# Patient Record
Sex: Female | Born: 1955 | Race: White | Hispanic: No | Smoking: Never smoker
Health system: Southern US, Community
[De-identification: ages and names within clinical notes are randomized; demographics above are authoritative.]

## PROBLEM LIST (undated history)

## (undated) DIAGNOSIS — E119 Type 2 diabetes mellitus without complications: Secondary | ICD-10-CM

## (undated) DIAGNOSIS — N183 Chronic kidney disease, stage 3 unspecified: Secondary | ICD-10-CM

## (undated) DIAGNOSIS — I10 Essential (primary) hypertension: Secondary | ICD-10-CM

## (undated) DIAGNOSIS — E669 Obesity, unspecified: Secondary | ICD-10-CM

## (undated) DIAGNOSIS — E785 Hyperlipidemia, unspecified: Secondary | ICD-10-CM

## (undated) HISTORY — DX: Hyperlipidemia, unspecified: E78.5

## (undated) HISTORY — PX: URETHRAL STRICTURE DILATATION: SHX477

## (undated) HISTORY — PX: ACHILLES TENDON REPAIR: SUR1153

## (undated) HISTORY — DX: Obesity, unspecified: E66.9

## (undated) HISTORY — PX: ANKLE SURGERY: SHX546

## (undated) HISTORY — DX: Essential (primary) hypertension: I10

## (undated) HISTORY — DX: Chronic kidney disease, stage 3 unspecified: N18.30

## (undated) HISTORY — DX: Type 2 diabetes mellitus without complications: E11.9

## (undated) HISTORY — DX: Chronic kidney disease, stage 3 (moderate): N18.3

---

## 1998-10-17 ENCOUNTER — Ambulatory Visit (HOSPITAL_COMMUNITY): Admission: RE | Admit: 1998-10-17 | Discharge: 1998-10-17 | Payer: Self-pay | Admitting: *Deleted

## 1998-10-22 ENCOUNTER — Ambulatory Visit (HOSPITAL_COMMUNITY): Admission: RE | Admit: 1998-10-22 | Discharge: 1998-10-22 | Payer: Self-pay | Admitting: *Deleted

## 2001-01-14 ENCOUNTER — Ambulatory Visit (HOSPITAL_COMMUNITY): Admission: RE | Admit: 2001-01-14 | Discharge: 2001-01-14 | Payer: Self-pay | Admitting: Orthopaedic Surgery

## 2001-01-14 ENCOUNTER — Encounter: Payer: Self-pay | Admitting: Orthopaedic Surgery

## 2003-07-05 ENCOUNTER — Ambulatory Visit (HOSPITAL_BASED_OUTPATIENT_CLINIC_OR_DEPARTMENT_OTHER): Admission: RE | Admit: 2003-07-05 | Discharge: 2003-07-05 | Payer: Self-pay | Admitting: Urology

## 2003-07-05 ENCOUNTER — Ambulatory Visit (HOSPITAL_COMMUNITY): Admission: RE | Admit: 2003-07-05 | Discharge: 2003-07-05 | Payer: Self-pay | Admitting: Urology

## 2003-07-05 ENCOUNTER — Encounter (INDEPENDENT_AMBULATORY_CARE_PROVIDER_SITE_OTHER): Payer: Self-pay

## 2004-02-14 ENCOUNTER — Encounter: Admission: RE | Admit: 2004-02-14 | Discharge: 2004-02-14 | Payer: Self-pay | Admitting: Internal Medicine

## 2004-09-16 ENCOUNTER — Encounter: Admission: RE | Admit: 2004-09-16 | Discharge: 2004-09-16 | Payer: Self-pay | Admitting: Internal Medicine

## 2005-11-12 ENCOUNTER — Ambulatory Visit: Payer: Self-pay | Admitting: Internal Medicine

## 2009-06-06 DIAGNOSIS — I10 Essential (primary) hypertension: Secondary | ICD-10-CM | POA: Diagnosis present

## 2009-07-22 ENCOUNTER — Encounter: Admission: RE | Admit: 2009-07-22 | Discharge: 2009-07-22 | Payer: Self-pay | Admitting: Podiatry

## 2010-12-19 NOTE — Op Note (Signed)
NAME:  Theresa Warren, Theresa Warren                        ACCOUNT NO.:  [id number]   MEDICAL RECORD NO.:  [id number]                   PATIENT TYPE:  AMB   LOCATION:  [facility]                                 FACILITY:  [facility]   PHYSICIAN:  Sigmund I. Gaynelle Arabian, M.D.         DATE OF BIRTH:  09/17/1955   DATE OF PROCEDURE:  07/05/2003  DATE OF DISCHARGE:                                 OPERATIVE REPORT   PREOPERATIVE DIAGNOSIS:  Interstitial cystitis.   POSTOPERATIVE DIAGNOSIS:  Interstitial cystitis.   OPERATION:  Cystourethroscopy, hydrodistention of the bladder, cold cup  bladder biopsy, cauterization of the biopsy sites, Clorpactin insertion,  Marcaine and Pyridium insertion, Marcaine and Kenalog injection at the  bladder base.   SURGEON:  Sigmund I. Gaynelle Arabian, M.D.   ANESTHESIA:  General LMA.   PREPARATION:  After appropriate preanesthesia, the patient was brought to  the operating room and placed on the operating table in dorsal supine  position where general LMA anesthesia was introduced. She was then replaced  in the dorsal lithotomy position where the pubis was prepped with Betadine  solution and draped in the usual fashion.   HISTORY:  The patient is a 55 year old female, with a history of recurrent  urinary tract infections and cystitis, on __________ suppression not seen in  several years. She is status post cystoscopy and dilation in 1998 which  definitely helped her, but now she is having more difficulty with urinary  frequency, urgency, suprapubic discomfort. She was diagnosed with diabetes  in 1997, and has done well managing her diabetes. The patient is now for a  cysto and a 2D bladder biopsy.   DESCRIPTION OF PROCEDURE:  The urethra was dilated to a size 2 [place] with  the female urethral dilators without difficulty. Cystoscopy showed a normal  appearing bladder, which holds only 550 mL, hydrodistended to 600 mL.  Multiple areas of microhemorrhages are identified  consistent with  interstitial cystitis. Following this, cold cup bladder biopsies are  obtained and each site is cauterized. These are sent to the laboratory for  evaluation. The bladder was then filled with 250 mL of Clorpactin, and left  for three minutes. The bladder was then drained of fluid, and Marcaine and  Pyridium is inserted in the bladder, and Marcaine and Kenalog is injected  into the bladder base. The patient was awakened and taken to the recovery  room in good condition.                                               Sigmund I. Gaynelle Arabian, M.D.    SIT/MEDQ  D:  07/05/2003  T:  07/05/2003  Job:  [id number]

## 2013-04-25 ENCOUNTER — Ambulatory Visit: Payer: Self-pay | Admitting: Internal Medicine

## 2014-04-24 ENCOUNTER — Ambulatory Visit: Payer: Self-pay | Admitting: Podiatry

## 2014-09-11 ENCOUNTER — Other Ambulatory Visit (HOSPITAL_COMMUNITY): Payer: Self-pay | Admitting: Internal Medicine

## 2014-09-11 ENCOUNTER — Encounter: Payer: Self-pay | Admitting: Internal Medicine

## 2014-09-11 ENCOUNTER — Encounter: Payer: Self-pay | Admitting: Cardiology

## 2014-09-11 ENCOUNTER — Ambulatory Visit (INDEPENDENT_AMBULATORY_CARE_PROVIDER_SITE_OTHER): Payer: 59 | Admitting: Cardiology

## 2014-09-11 VITALS — BP 158/82 | HR 130 | Ht 61.5 in | Wt 281.3 lb

## 2014-09-11 DIAGNOSIS — R0602 Shortness of breath: Secondary | ICD-10-CM

## 2014-09-11 DIAGNOSIS — R0600 Dyspnea, unspecified: Secondary | ICD-10-CM | POA: Insufficient documentation

## 2014-09-11 DIAGNOSIS — R002 Palpitations: Secondary | ICD-10-CM | POA: Insufficient documentation

## 2014-09-11 DIAGNOSIS — R7989 Other specified abnormal findings of blood chemistry: Secondary | ICD-10-CM

## 2014-09-11 NOTE — Patient Instructions (Signed)
Your physician recommends that you schedule a follow-up appointment in: as needed with Dr. Percival Spanish  We are ordering a stress test

## 2014-09-11 NOTE — Progress Notes (Signed)
Cardiology Office Note   Date:  09/11/2014   ID:  [name], DOB 02-Jun-1956, MRN [id number]  PCP:  No primary care provider on file.  Cardiologist:   Minus Breeding, MD   Chief Complaint  Patient presents with  . Palpitations  . Shortness of Breath      History of Present Illness: Theresa Warren is a 59 y.o. female who presents for evaluation of chest pain and SOB.   She has no prior cardiac history though she does have some risk factors. She's had some increasing lower extremity swelling that was treated with Lasix. She's mostly been bothered by some chronic fatigue. She also has shortness of breath. The shortness of breath has been the biggest issue and has been progressive. It started with activities but has now progressed to being shortness of breath at rest.  She stacks her pillows. It has been slowly progressive over one year. She's not describing classic PND or orthopnea.  She does have chest discomfort. This is a soreness that is there almost constantly but she does also get some other sharper discomfort. This is not reproducible with activities. It happens at rest and goes away if it does go away spontaneously. The most difficult thing she does physically is roll her trash cans to the curb and this will cause shortness of breath.  She did have a workup by Dr. Joylene Draft.  BNP was 18 she had a slightly elevated d-dimer and lower extremity Dopplers. She's not had prior cardiac workup however. Of note he does have renal insufficiency that has been stable.    Past Medical History  Diagnosis Date  . CKD (chronic kidney disease), stage III   . Hypertension   . Obesity   . Hyperlipidemia   . Diabetes     Past Surgical History  Procedure Laterality Date  . Urethral stricture dilatation    . Ankle surgery    . Achilles tendon repair       Current Outpatient Prescriptions  Medication Sig Dispense Refill  . ALPRAZolam (XANAX) 0.5 MG tablet Take 0.5 mg by mouth at bedtime as  needed for anxiety.    Marland Kitchen atorvastatin (LIPITOR) 40 MG tablet Take 40 mg by mouth daily.    . Calcium Citrate-Vitamin D (CALCIUM CITRATE + D PO) Take 1 tablet by mouth daily.    . fexofenadine (ALLEGRA) 180 MG tablet Take 180 mg by mouth daily.    Marland Kitchen HUMULIN R 500 UNIT/ML SOLN injection Inject 19 Units into the skin 2 (two) times daily.  3  . Omega-3 Krill Oil 500 MG CAPS Take 1 capsule by mouth daily.    Marland Kitchen OVER THE COUNTER MEDICATION Take 1 tablet by mouth daily. Multi-Betic    . traMADol (ULTRAM) 50 MG tablet Take 50 mg by mouth every 6 (six) hours as needed.    . valsartan-hydrochlorothiazide (DIOVAN-HCT) 320-25 MG per tablet Take 1 tablet by mouth daily.  3  . Vitamin D, Ergocalciferol, (DRISDOL) 50000 UNITS CAPS capsule Take 1 capsule by mouth 2 (two) times a week.  4  . zolpidem (AMBIEN) 5 MG tablet Take 5 mg by mouth at bedtime as needed for sleep.     No current facility-administered medications for this visit.    Allergies:   Sulfa antibiotics and Penicillins    Social History:  The patient  reports that she has never smoked. She does not have any smokeless tobacco history on file.   Family History:  The patient's family history includes ALS  in her mother; Alzheimer's disease in her father; CAD in an other family member; CAD (age of onset: 22) in her father; Diabetes in her father; Lung cancer in her sister.    ROS:  Please see the history of present illness.   Otherwise, review of systems are positive for reflux, fatigue chronically, back pain..   All other systems are reviewed and negative.    PHYSICAL EXAM: VS:  BP 158/82 mmHg  Pulse 130  Ht 5' 1.5" (1.562 m)  Wt 281 lb 4.8 oz (127.597 kg)  BMI 52.30 kg/m2 , BMI Body mass index is 52.3 kg/(m^2). GEN: Well nourished, well developed, in no acute distress HEENT: normal Neck: no JVD, carotid bruits, or masses Cardiac: RRR; no murmurs, rubs, or gallops,trace edema  Respiratory:  clear to auscultation bilaterally, normal work  of breathing GI: soft, nontender, nondistended, + BS MS: no deformity or atrophy Skin: warm and dry, no rash Neuro:  Strength and sensation are intact Psych: euthymic mood, full affect, tearful   EKG:  EKG is ordered today. The ekg ordered today demonstrates sinus tachycardia, rate 130, axis within normal limits, intervals within normal limits, no acute ST-T wave changes.   Recent Labs: No results found for requested labs within last 365 days.    Lipid Panel No results found for: CHOL, TRIG, HDL, CHOLHDL, VLDL, LDLCALC, LDLDIRECT    Wt Readings from Last 3 Encounters:  09/11/14 281 lb 4.8 oz (127.597 kg)      Other studies Reviewed: Additional studies/ records that were reviewed today include: Outside records and labs. Review of the above records demonstrates: See elsewhere   ASSESSMENT AND PLAN:  CHEST PAIN:  I don't have a strong suspicion that this is cardiac. However, he does have risk factors. She needs stress testing but wouldn't be a walk on a treadmill. She will have a [employer].  DYSPNEA:  I don't think that this is related to heart failure. Her BNP was normal. I don't think an echo would be contributory. There is no evidence of ischemia I might suggest further pulmonary workup. Certainly weight and deconditioning contribute.  TEARFULNESS:  Dr. Joylene Draft has wanted to treat her for depression. I think she probably has some element of this and we talked about this briefly.   Current medicines are reviewed at length with the patient today.  The patient does not have concerns regarding medicines.  The following changes have been made:  no change  Labs/ tests ordered today include: Lexiscan Myoview.  Orders Placed This Encounter  Procedures  . Myocardial Perfusion Imaging     Disposition:   FU with me as needed   Signed, Minus Breeding, MD  09/11/2014 3:22 PM    [facility]

## 2014-09-12 ENCOUNTER — Encounter: Payer: Self-pay | Admitting: Cardiology

## 2014-09-12 ENCOUNTER — Ambulatory Visit (HOSPITAL_COMMUNITY): Admission: RE | Admit: 2014-09-12 | Payer: 59 | Source: Ambulatory Visit

## 2014-09-12 ENCOUNTER — Ambulatory Visit (HOSPITAL_COMMUNITY): Payer: 59

## 2014-09-12 ENCOUNTER — Other Ambulatory Visit (HOSPITAL_COMMUNITY): Payer: Self-pay | Admitting: Cardiology

## 2014-09-12 DIAGNOSIS — R0600 Dyspnea, unspecified: Secondary | ICD-10-CM

## 2014-09-12 DIAGNOSIS — R079 Chest pain, unspecified: Secondary | ICD-10-CM

## 2014-09-14 ENCOUNTER — Ambulatory Visit (HOSPITAL_COMMUNITY)
Admission: RE | Admit: 2014-09-14 | Discharge: 2014-09-14 | Disposition: A | Discharge status: Home / Self Care | Payer: 59 | Source: Ambulatory Visit | Attending: Internal Medicine | Admitting: Internal Medicine

## 2014-09-14 DIAGNOSIS — R791 Abnormal coagulation profile: Secondary | ICD-10-CM | POA: Insufficient documentation

## 2014-09-14 DIAGNOSIS — R0602 Shortness of breath: Secondary | ICD-10-CM

## 2014-09-14 DIAGNOSIS — R0789 Other chest pain: Secondary | ICD-10-CM | POA: Diagnosis not present

## 2014-09-14 DIAGNOSIS — R7989 Other specified abnormal findings of blood chemistry: Secondary | ICD-10-CM

## 2014-09-14 MED ORDER — TECHNETIUM TO 99M ALBUMIN AGGREGATED
6.0000 | Freq: Once | INTRAVENOUS | Status: AC | PRN
  Administered 2014-09-14: 6 via INTRAVENOUS

## 2014-09-14 MED ORDER — TECHNETIUM TC 99M DIETHYLENETRIAME-PENTAACETIC ACID: 40.0000 | Freq: Once | INTRAVENOUS | Status: AC | PRN

## 2014-09-14 NOTE — Addendum Note (Signed)
Addended byChauncy Lean. on: 09/14/2014 01:43 PM   Modules accepted: Orders

## 2014-09-25 ENCOUNTER — Telehealth (HOSPITAL_COMMUNITY): Payer: Self-pay

## 2014-09-25 NOTE — Telephone Encounter (Signed)
Encounter complete. 

## 2014-09-26 ENCOUNTER — Telehealth (HOSPITAL_COMMUNITY): Payer: Self-pay

## 2014-09-26 NOTE — Telephone Encounter (Signed)
Encounter complete. 

## 2014-09-27 ENCOUNTER — Ambulatory Visit (HOSPITAL_COMMUNITY)
Admission: RE | Admit: 2014-09-27 | Discharge: 2014-09-27 | Disposition: A | Discharge status: Home / Self Care | Payer: 59 | Source: Ambulatory Visit | Attending: Cardiovascular Disease | Admitting: Cardiovascular Disease

## 2014-09-27 DIAGNOSIS — R002 Palpitations: Secondary | ICD-10-CM | POA: Insufficient documentation

## 2014-09-27 DIAGNOSIS — R0600 Dyspnea, unspecified: Secondary | ICD-10-CM | POA: Insufficient documentation

## 2014-09-27 DIAGNOSIS — R0602 Shortness of breath: Secondary | ICD-10-CM

## 2014-09-27 DIAGNOSIS — R0789 Other chest pain: Secondary | ICD-10-CM

## 2014-09-27 MED ORDER — REGADENOSON 0.4 MG/5ML IV SOLN
0.4000 mg | Freq: Once | INTRAVENOUS | Status: AC
  Administered 2014-09-27: 0.4 mg via INTRAVENOUS

## 2014-09-27 MED ORDER — TECHNETIUM TC 99M SESTAMIBI GENERIC - CARDIOLITE
32.0000 | Freq: Once | INTRAVENOUS | Status: AC | PRN
  Administered 2014-09-27: 32 via INTRAVENOUS

## 2014-09-27 MED ORDER — AMINOPHYLLINE 25 MG/ML IV SOLN
125.0000 mg | Freq: Once | INTRAVENOUS | Status: AC
  Administered 2014-09-27: 125 mg via INTRAVENOUS

## 2014-09-27 NOTE — Procedures (Addendum)
[facility] [address] [place] [address] [id number]  Cardiology Nuclear Med Study  Theresa Warren is a 59 y.o. female     MRN : [id number]     DOB: 07-02-1956  Procedure Date: 09/27/2014  Nuclear Med Background Indication for Stress Test:  Evaluation for Ischemia History:  No prior cardiac or respiratory history reported;No prior NUC MPI for comparison. Cardiac Risk Factors: Hypertension, IDDM Type 2, Lipids, Obesity and CKD III;LEE  Symptoms:  Chest Pain, DOE, Fatigue, Light-Headedness, Nausea, Palpitations and SOB   Nuclear Pre-Procedure Caffeine/Decaff Intake:  12:30am NPO After: 9:30pm   IV Site: R Forearm  IV 0.9% NS with Angio Cath:  22g  Chest Size (in):  n/a IV Started by: Rolene Course, RN  Height: 5\' 1"  (1.549 m)  Cup Size: C  BMI:  Body mass index is 53.12 kg/(m^2). Weight:  281 lb (127.461 kg)   Tech Comments:  n/a    Nuclear Med Study 1 or 2 day study: 2 day  Stress Test Type:  [place] Provider:  Minus Breeding, MD   Resting Radionuclide: Technetium 90m Sestamibi  Resting Radionuclide Dose: 31.1 mCi   Stress Radionuclide:  Technetium 42m Sestamibi  Stress Radionuclide Dose: 32.0 mCi           Stress Protocol Rest HR: 136 Stress HR: 122  Rest BP: 166/87 Stress BP: 173/64  Exercise Time (min): n/a METS: n/a          Dose of Adenosine (mg):  n/a Dose of Lexiscan: 0.4 mg  Dose of Atropine (mg): n/a Dose of Dobutamine: n/a mcg/kg/min (at max HR)  Stress Test Technologist: Leane Para, CCT Nuclear Technologist: Imagene Riches, CNMT   Rest Procedure:  Myocardial perfusion imaging was performed at rest 45 minutes following the intravenous administration of Technetium 66m Sestamibi. Stress Procedure:  The patient received IV Lexiscan 0.4 mg over 15-seconds.  Technetium 73m Sestamibi injected IV at 30-seconds.  Patient experienced nausea and headache and 125 mg Aminophylline IV  was administered.There were no significant changes with Lexiscan.  Quantitative spect images were obtained after a 45 minute delay.  Transient Ischemic Dilatation (Normal <1.22):  0.93  QGS EDV:  49 ml QGS ESV:  14 ml LV Ejection Fraction: 72%    Rest ECG: Sinus tachycardia, low voltage, no ST changes.  Stress ECG: No significant ST segment change suggestive of ischemia.  QPS Raw Data Images:  Acquisition technically good; normal left ventricular size. Stress Images:  Normal homogeneous uptake in all areas of the myocardium. Rest Images:  Normal homogeneous uptake in all areas of the myocardium. Subtraction (SDS):  No evidence of ischemia.  Impression Exercise Capacity:  Lexiscan with no exercise. BP Response:  Normal blood pressure response. Clinical Symptoms:  No chest pain or dyspnea. ECG Impression:  No significant ST segment change suggestive of ischemia. Comparison with Prior Nuclear Study: No previous nuclear study performed  Overall Impression:  Normal stress nuclear study.  LV Wall Motion:  NL LV Function; NL Wall Motion   Kirk Ruths, MD  09/28/2014 5:18 PM

## 2014-09-28 ENCOUNTER — Ambulatory Visit (HOSPITAL_COMMUNITY)
Admission: RE | Admit: 2014-09-28 | Discharge: 2014-09-28 | Disposition: A | Discharge status: Home / Self Care | Payer: 59 | Source: Ambulatory Visit | Attending: Cardiology | Admitting: Cardiology

## 2014-09-28 DIAGNOSIS — E669 Obesity, unspecified: Secondary | ICD-10-CM | POA: Insufficient documentation

## 2014-09-28 DIAGNOSIS — R002 Palpitations: Secondary | ICD-10-CM | POA: Insufficient documentation

## 2014-09-28 DIAGNOSIS — R079 Chest pain, unspecified: Secondary | ICD-10-CM | POA: Insufficient documentation

## 2014-09-28 DIAGNOSIS — R110 Nausea: Secondary | ICD-10-CM | POA: Diagnosis not present

## 2014-09-28 DIAGNOSIS — N183 Chronic kidney disease, stage 3 (moderate): Secondary | ICD-10-CM | POA: Insufficient documentation

## 2014-09-28 DIAGNOSIS — R0602 Shortness of breath: Secondary | ICD-10-CM

## 2014-09-28 DIAGNOSIS — E119 Type 2 diabetes mellitus without complications: Secondary | ICD-10-CM | POA: Insufficient documentation

## 2014-09-28 DIAGNOSIS — I129 Hypertensive chronic kidney disease with stage 1 through stage 4 chronic kidney disease, or unspecified chronic kidney disease: Secondary | ICD-10-CM | POA: Insufficient documentation

## 2014-09-28 DIAGNOSIS — R5383 Other fatigue: Secondary | ICD-10-CM | POA: Diagnosis not present

## 2014-09-28 DIAGNOSIS — R42 Dizziness and giddiness: Secondary | ICD-10-CM | POA: Diagnosis not present

## 2014-09-28 DIAGNOSIS — R0600 Dyspnea, unspecified: Secondary | ICD-10-CM | POA: Insufficient documentation

## 2014-09-28 DIAGNOSIS — R0789 Other chest pain: Secondary | ICD-10-CM

## 2014-09-28 MED ORDER — TECHNETIUM TC 99M SESTAMIBI GENERIC - CARDIOLITE
31.1000 | Freq: Once | INTRAVENOUS | Status: AC | PRN
  Administered 2014-09-28: 31.1 via INTRAVENOUS

## 2014-10-01 ENCOUNTER — Telehealth (HOSPITAL_COMMUNITY): Payer: Self-pay | Admitting: *Deleted

## 2014-10-10 ENCOUNTER — Ambulatory Visit (INDEPENDENT_AMBULATORY_CARE_PROVIDER_SITE_OTHER): Payer: 59 | Admitting: Internal Medicine

## 2014-10-10 ENCOUNTER — Encounter: Payer: Self-pay | Admitting: Internal Medicine

## 2014-10-10 VITALS — BP 110/76 | HR 139 | Temp 98.0°F | Ht 61.5 in | Wt 293.6 lb

## 2014-10-10 DIAGNOSIS — R0600 Dyspnea, unspecified: Secondary | ICD-10-CM

## 2014-10-10 DIAGNOSIS — G479 Sleep disorder, unspecified: Secondary | ICD-10-CM | POA: Insufficient documentation

## 2014-10-10 NOTE — Progress Notes (Signed)
Date: 10/10/2014  MRN# [id number] Theresa Warren 1956-01-17  Referring Physician: Dr. Willette Pa is a 59 y.o. old female seen in consultation for chronic shortness of breath  CC "short of breath"  Chief Complaint  Patient presents with  . Advice Only    Pt referred for sob and palpitations.    HPI:  Patient is a pleasant 59 year old female with a past medical history of diabetes, hypertension, obesity, osteoarthritis, stage III kidney disease, being evaluated for dyspnea. Patient states that for the past 2-3 months she has chronic dyspnea occurring at rest and with exertion. Patient states that she can walk about 20-30 yards before she gets severely dyspneic, walking from the parking lot to the clinic induced dyspnea. She has had extensive workup with negative VQ scan, negative nuclear cardiac stress test, normal chest x-ray, normal lower extremity Dopplers. Patient stated she had a bronchitis back in January 2015 that lasted for a couple weeks and experiencing shortness of breath at that time. In December of this past year patient states she may have had a upper respiratory tract infection and noted dyspnea lasting since then also. Currently she endorses hoarse voice, nasal congestion, she is currently on Allegra which is not helping her nasal congestion or runny nose. She also endorses chronic fatigue and chronic lower extremity swelling. She was seen by her memory care physician within the last month placed on Asmanex with very little relief in her shortness of breath, she stated Asmanex gave her headaches. Patient also endorses a intermittent nonproductive cough at times. She has had spoke to about sleep apnea testing in the past but was unable to perform a do to scheduling difficulties. Given her severe obesity advised on having a gastric bypass evaluation, but patient declined this. Patient states she is a nonsmoker, does not have a secondhand smoke exposure, she is currently  employed as a Quarry manager at [employer], patient currently has no pets used to have a cat a couple of years ago.  Obstructive Sleep Apnea Screening The patient was screened with the STOP-BANG questionnaire. >3 positive responses is considered a positive screen  Snoring  YES Tiredness  YES Observed Apnea NO Pressure (HTN) YES BMI >35  YES Age > 50  YES Neck >17"  YES Gender (female) NO  Total: 6/8  Screen: POSITIVE      PMHX:   Past Medical History  Diagnosis Date  . CKD (chronic kidney disease), stage III   . Hypertension   . Obesity   . Hyperlipidemia   . Diabetes    Surgical Hx:  Past Surgical History  Procedure Laterality Date  . Urethral stricture dilatation    . Ankle surgery    . Achilles tendon repair     Family Hx:  Family History  Problem Relation Age of Onset  . Alzheimer's disease Father   . Diabetes Father   . CAD Father 62  . CAD      Multliple maternal aunts and uncles with early onset heart disease  . Lung cancer Sister   . ALS Mother    Social Hx:   History  Substance Use Topics  . Smoking status: Never Smoker   . Smokeless tobacco: Not on file  . Alcohol Use: Not on file   Medication:   Current Outpatient Rx  Name  Route  Sig  Dispense  Refill  . ALPRAZolam (XANAX) 0.5 MG tablet   Oral   Take 0.5 mg by mouth at bedtime as  needed for anxiety.         Marland Kitchen atorvastatin (LIPITOR) 40 MG tablet   Oral   Take 40 mg by mouth daily.         . Calcium Citrate-Vitamin D (CALCIUM CITRATE + D PO)   Oral   Take 1 tablet by mouth daily.         . fexofenadine (ALLEGRA) 180 MG tablet   Oral   Take 180 mg by mouth daily.         . furosemide (LASIX) 20 MG tablet   Oral   Take 20 mg by mouth daily.         Marland Kitchen HUMULIN R 500 UNIT/ML SOLN injection   Subcutaneous   Inject 19 Units into the skin 2 (two) times daily.      3     Dispense as written.   . Omega-3 Krill Oil 500 MG CAPS   Oral   Take 1 capsule by mouth daily.          Marland Kitchen OVER THE COUNTER MEDICATION   Oral   Take 1 tablet by mouth daily. Multi-Betic         . traMADol (ULTRAM) 50 MG tablet   Oral   Take 50 mg by mouth every 6 (six) hours as needed.         . valsartan-hydrochlorothiazide (DIOVAN-HCT) 320-25 MG per tablet   Oral   Take 1 tablet by mouth daily.      3   . Vitamin D, Ergocalciferol, (DRISDOL) 50000 UNITS CAPS capsule   Oral   Take 1 capsule by mouth 2 (two) times a week.      4   . zolpidem (AMBIEN) 5 MG tablet   Oral   Take 5 mg by mouth at bedtime as needed for sleep.             Allergies:  Sulfa antibiotics and Penicillins  Review of Systems: Gen:  Denies  fever, sweats, chills HEENT: nasal congestion, runny nose, mild intermittent headaches Cvc:  Chest tightness and heaviness at times Resp:   Shortness of breath at rest and with exertion, intermittent dry cough Gi: Denies swallowing difficulty, stomach pain, nausea or vomiting, diarrhea, constipation, bowel incontinence Gu:  Denies bladder incontinence, burning urine Ext:   No Joint pain, admits to leg swelling in the lower extremities Skin: No skin rash, easy bruising or bleeding or hives Endoc:  No polyuria, polydipsia , polyphagia or weight change Psych: No depression, insomnia or hallucinations  Other:  All other systems negative  Physical Examination:   VS: BP 110/76 mmHg  Pulse 139  Temp(Src) 98 F (36.7 C) (Oral)  Ht 5' 1.5" (1.562 m)  Wt 293 lb 9.6 oz (133.176 kg)  BMI 54.58 kg/m2  SpO2 97%  General Appearance: No distress  Neuro:without focal findings, mental status, speech normal, alert and oriented, cranial nerves 2-12 intact, reflexes normal and symmetric, sensation grossly normal  HEENT: PERRLA, EOM intact, no ptosis, no other lesions noticed; Mallampati 3 Pulmonary: normal breath sounds in the upper airways, mild shallow respiratory effort, decreased breath sounds at the bilateral bases. No wheezes, crackles, or rhonchi. Sputum  production: None CardiovascularNormal S1,S2.  No m/r/g.  Abdominal aorta pulsation normal.    Abdomen: Benign, Soft, non-tender, No masses, hepatosplenomegaly, No lymphadenopathy Renal:  No costovertebral tenderness  GU:  No performed at this time. Endoc: No evident thyromegaly, no signs of acromegaly or Cushing features Skin:   warm, no rashes, no  ecchymosis  Extremities: normal, no cyanosis, clubbing, no edema, warm with normal capillary refill. Other findings:   Labs results:   Rad results:  09/28/14 Impression Exercise Capacity: Lexiscan with no exercise. BP Response: Normal blood pressure response. Clinical Symptoms: No chest pain or dyspnea. ECG Impression: No significant ST segment change suggestive of  ischemia. Comparison with Prior Nuclear Study: No previous nuclear study  performed  Overall Impression: Normal stress nuclear study.  09/14/2014 Chest x-ray EXAM: CHEST 2 VIEW  COMPARISON: None.  FINDINGS: The heart size and mediastinal contours are within normal limits. Both lungs are clear. No pneumothorax or plural effusion is noted. The visualized skeletal structures are unremarkable.  IMPRESSION: No acute cardiopulmonary abnormality seen.   09/14/2014 VQ scan EXAM: NUCLEAR MEDICINE VENTILATION - PERFUSION LUNG SCAN  TECHNIQUE: Ventilation images were obtained in multiple projections using inhaled aerosol technetium 99 M DTPA. Perfusion images were obtained in multiple projections after intravenous injection of Tc-46m MAA.  RADIOPHARMACEUTICALS: Six mCi Tc-42m DTPA aerosol and 40 mCi Tc-54m MAA  COMPARISON: None.  FINDINGS: Ventilation: No focal ventilation defect.  Perfusion: No wedge shaped peripheral perfusion defects to suggest acute pulmonary embolism.  IMPRESSION: No evidence of acute pulmonary embolism.  09/11/2014 Bilateral lower extremity Dopplers Negative DVT bilateral lower extremities    Assessment and  Plan:59 year old female past medical history of stage IIIc daily, hyperlipidemia, hypertension, diabetes, obesity being evaluated for chronic shortness of breath Sleep disturbance Patient respect of sleep apnea. Sleep screening test was severely positive. Stop bang score 6/8  Plan: -Split-night study -Weight loss, healthy diet, exercise.   Severe obesity (BMI >= 40) OBESITY  Wt: 293 lbs BMI:54 Discussed importance of weight reduction.  Educated regarding limitation of  intake of greasy/fried foods.  Instructed on benefit of  a low-impact exercise program, starting slowly.  Discussed benefits of 30-45 minutes of some form of exercise daily as well as benefit of supervised exercise program.  Introduced the idea of gastric bypass to the patient currently she states that this is not an option that she would like to pursue at this time.     Dyspnea Differential diagnosis includes: Obesity, sleep apnea, obstructive disease, restrictive disease, parenchymal lung disease,  At this time believe the patient's chronic shortness of breath is multifactorial in nature related to obesity and possibly underlying obstructive sleep apnea with deconditioning. Also given the severity of her dyspnea with her current negative cardiac workup, will further evaluate with CT chest without contrast.  Plan: -Pulmonary function testing and 6 minute walk test -CT chest without contrast (patient with history of stage III kidney disease) -Split-night sleep study -Weight loss, healthy diet, exercise     Updated Medication List Outpatient Encounter Prescriptions as of 10/10/2014  Medication Sig  . ALPRAZolam (XANAX) 0.5 MG tablet Take 0.5 mg by mouth at bedtime as needed for anxiety.  Marland Kitchen atorvastatin (LIPITOR) 40 MG tablet Take 40 mg by mouth daily.  . Calcium Citrate-Vitamin D (CALCIUM CITRATE + D PO) Take 1 tablet by mouth daily.  . fexofenadine (ALLEGRA) 180 MG tablet Take 180 mg by mouth daily.  .  furosemide (LASIX) 20 MG tablet Take 20 mg by mouth daily.  Marland Kitchen HUMULIN R 500 UNIT/ML SOLN injection Inject 19 Units into the skin 2 (two) times daily.  . Omega-3 Krill Oil 500 MG CAPS Take 1 capsule by mouth daily.  Marland Kitchen OVER THE COUNTER MEDICATION Take 1 tablet by mouth daily. Multi-Betic  . traMADol (ULTRAM) 50 MG tablet Take 50 mg by mouth  every 6 (six) hours as needed.  . valsartan-hydrochlorothiazide (DIOVAN-HCT) 320-25 MG per tablet Take 1 tablet by mouth daily.  . Vitamin D, Ergocalciferol, (DRISDOL) 50000 UNITS CAPS capsule Take 1 capsule by mouth 2 (two) times a week.  . zolpidem (AMBIEN) 5 MG tablet Take 5 mg by mouth at bedtime as needed for sleep.    Orders for this visit: Orders Placed This Encounter  Procedures  . CT Chest Wo Contrast    Standing Status: Future     Number of Occurrences:      Standing Expiration Date: 12/10/2015    Order Specific Question:  Reason for Exam (SYMPTOM  OR DIAGNOSIS REQUIRED)    Answer:  sob    Order Specific Question:  Is the patient pregnant?    Answer:  No    Order Specific Question:  Preferred imaging location?    Answer:  [facility]  . Pulmonary function test    Standing Status: Future     Number of Occurrences:      Standing Expiration Date: 10/10/2015    Order Specific Question:  Where should this test be performed?    Answer:  [facility]    Order Specific Question:  Full PFT: includes the following: basic spirometry, spirometry pre & post bronchodilator, diffusion capacity (DLCO), lung volumes    Answer:  Full PFT    Order Specific Question:  MIP/MEP    Answer:  No    Order Specific Question:  6 minute walk    Answer:  Yes    Order Specific Question:  ABG    Answer:  No    Order Specific Question:  Diffusion capacity (DLCO)    Answer:  No    Order Specific Question:  Lung volumes    Answer:  No    Order Specific Question:  Methacholine challenge    Answer:  No  . Split night study    Standing Status: Future      Number of Occurrences:      Standing Expiration Date: 10/10/2015    Order Specific Question:  Where should this test be performed:    Answer:  [facility]     Thank  you for the consultation and for allowing [facility] to assist in the care of your patient. Our recommendations are noted above.  Please contact us if we can be of further service.   Vilinda Boehringer, MD [facility] Office Number: [phone]

## 2014-10-10 NOTE — Assessment & Plan Note (Signed)
OBESITY  Wt: 293 lbs BMI:54 Discussed importance of weight reduction.  Educated regarding limitation of  intake of greasy/fried foods.  Instructed on benefit of  a low-impact exercise program, starting slowly.  Discussed benefits of 30-45 minutes of some form of exercise daily as well as benefit of supervised exercise program.  Introduced the idea of gastric bypass to the patient currently she states that this is not an option that she would like to pursue at this time.

## 2014-10-10 NOTE — Assessment & Plan Note (Signed)
Patient respect of sleep apnea. Sleep screening test was severely positive. Stop bang score 6/8  Plan: -Split-night study -Weight loss, healthy diet, exercise.

## 2014-10-10 NOTE — Patient Instructions (Addendum)
Please follow up with Dr. Stevenson Clinch - please obtain a split night study, our office will make a referral - CT chest without contrast - diet, weight loss, and exercise - pulmonary functioning testing and 6 minute walk test.

## 2014-10-10 NOTE — Assessment & Plan Note (Signed)
Differential diagnosis includes: Obesity, sleep apnea, obstructive disease, restrictive disease, parenchymal lung disease,  At this time believe the patient's chronic shortness of breath is multifactorial in nature related to obesity and possibly underlying obstructive sleep apnea with deconditioning. Also given the severity of her dyspnea with her current negative cardiac workup, will further evaluate with CT chest without contrast.  Plan: -Pulmonary function testing and 6 minute walk test -CT chest without contrast (patient with history of stage III kidney disease) -Split-night sleep study -Weight loss, healthy diet, exercise

## 2014-10-11 ENCOUNTER — Institutional Professional Consult (permissible substitution): Payer: 59 | Admitting: Internal Medicine

## 2014-10-18 ENCOUNTER — Ambulatory Visit: Payer: Self-pay | Admitting: Internal Medicine

## 2014-11-15 ENCOUNTER — Ambulatory Visit: Admit: 2014-11-15 | Disposition: A | Discharge status: Home / Self Care | Payer: Self-pay | Admitting: Internal Medicine

## 2014-11-15 ENCOUNTER — Encounter: Payer: Self-pay | Admitting: Internal Medicine

## 2014-11-26 ENCOUNTER — Ambulatory Visit (INDEPENDENT_AMBULATORY_CARE_PROVIDER_SITE_OTHER): Payer: 59 | Admitting: Internal Medicine

## 2014-11-26 ENCOUNTER — Ambulatory Visit: Payer: 59

## 2014-11-26 DIAGNOSIS — R0600 Dyspnea, unspecified: Secondary | ICD-10-CM

## 2014-11-26 DIAGNOSIS — R002 Palpitations: Secondary | ICD-10-CM

## 2014-11-26 LAB — PULMONARY FUNCTION TEST
DL/VA % pred: 89 %
DL/VA: 4.07 ml/min/mmHg/L
DLCO unc % pred: 100 %
DLCO unc: 21.64 ml/min/mmHg
FEF 25-75 Post: 2.24 L/sec
FEF 25-75 Pre: 2.29 L/sec
FEF2575-%Change-Post: -2 %
FEF2575-%Pred-Post: 94 %
FEF2575-%Pred-Pre: 96 %
FEV1-%Change-Post: 0 %
FEV1-%Pred-Post: 89 %
FEV1-%Pred-Pre: 88 %
FEV1-Post: 2.18 L
FEV1-Pre: 2.16 L
FEV1FVC-%Change-Post: 1 %
FEV1FVC-%Pred-Pre: 102 %
FEV6-%Change-Post: 0 %
FEV6-%Pred-Post: 87 %
FEV6-%Pred-Pre: 87 %
FEV6-Post: 2.64 L
FEV6-Pre: 2.64 L
FEV6FVC-%Change-Post: 0 %
FEV6FVC-%Pred-Post: 103 %
FEV6FVC-%Pred-Pre: 104 %
FVC-%Change-Post: 0 %
FVC-%Pred-Post: 84 %
FVC-%Pred-Pre: 85 %
FVC-Post: 2.65 L
FVC-Pre: 2.67 L
Post FEV1/FVC ratio: 82 %
Post FEV6/FVC ratio: 100 %
Pre FEV1/FVC ratio: 81 %
Pre FEV6/FVC Ratio: 100 %
RV % pred: 89 %
RV: 1.64 L
TLC % pred: 93 %
TLC: 4.43 L

## 2014-11-26 NOTE — Progress Notes (Signed)
PFT performed today. 

## 2014-11-26 NOTE — Progress Notes (Signed)
SMW performed today. 

## 2014-11-27 ENCOUNTER — Ambulatory Visit (INDEPENDENT_AMBULATORY_CARE_PROVIDER_SITE_OTHER): Payer: 59 | Admitting: Internal Medicine

## 2014-11-27 DIAGNOSIS — R0600 Dyspnea, unspecified: Secondary | ICD-10-CM | POA: Diagnosis not present

## 2014-11-27 DIAGNOSIS — G4733 Obstructive sleep apnea (adult) (pediatric): Secondary | ICD-10-CM

## 2014-11-27 MED ORDER — FLUTICASONE PROPIONATE 50 MCG/ACT NA SUSP: 2.0000 | Freq: Every day | NASAL | Status: AC

## 2014-11-27 NOTE — Assessment & Plan Note (Addendum)
OBESITY  Wt: 297 lbs (4 pound increase since last visit is). Discussed importance of weight reduction.  Educated regarding limitation of  intake of greasy/fried foods.  Instructed on benefit of  a low-impact exercise program, starting slowly.  Discussed benefits of 30-45 minutes of some form of exercise daily as well as benefit of supervised exercise program.  Introduced the idea of gastric bypass to the patient currently she states that this is not an option that she would like to pursue at this time.

## 2014-11-27 NOTE — Progress Notes (Signed)
MRN# [id number] Theresa Warren 01-18-1956   CC:"shortness breath" Chief Complaint  Patient presents with  . Follow-up    Pt here f/u SMW/PFT, CT, split night      Brief History: HPI 10/10/14 Patient is a pleasant 59 year old female with a past medical history of diabetes, hypertension, obesity, osteoarthritis, stage III kidney disease, being evaluated for dyspnea. Patient states that for the past 2-3 months she has chronic dyspnea occurring at rest and with exertion. Patient states that she can walk about 20-30 yards before she gets severely dyspneic, walking from the parking lot to the clinic induced dyspnea. She has had extensive workup with negative VQ scan, negative nuclear cardiac stress test, normal chest x-ray, normal lower extremity Dopplers. Patient stated she had a bronchitis back in January 2015 that lasted for a couple weeks and experiencing shortness of breath at that time. In December of this past year patient states she may have had a upper respiratory tract infection and noted dyspnea lasting since then also. Currently she endorses hoarse voice, nasal congestion, she is currently on Allegra which is not helping her nasal congestion or runny nose. She also endorses chronic fatigue and chronic lower extremity swelling. She was seen by her primary care physician within the last month placed on Asmanex with very little relief in her shortness of breath, she stated Asmanex gave her headaches. Patient also endorses a intermittent nonproductive cough at times. She has had spoke to about sleep apnea testing in the past but was unable to perform a do to scheduling difficulties. Given her severe obesity advised on having a gastric bypass evaluation, but patient declined this. Patient states she is a nonsmoker, does not have a secondhand smoke exposure, she is currently employed as a Quarry manager at [employer], patient currently has no pets used to have a cat a couple of years ago. STOP  BANG = Positive Plan - split night study, CT chest w\o contrast, wt loss, PFTs, 6MWT   Events since last clinic visit: Patient presents today for followup visit of her continued shortness of breath. Overall she states that her shortness of breath is in stable since her last visit, there have been no emergency room/urgent care visits since she last saw pulmonary. Still with nasal congestion in the morning. Had sleep study done. No fever, no cough, does admit to chronic nasal congestion. Had pfts, 72mwt, and CT chest done.    PMHX:   Past Medical History  Diagnosis Date  . CKD (chronic kidney disease), stage III   . Hypertension   . Obesity   . Hyperlipidemia   . Diabetes    Surgical Hx:  Past Surgical History  Procedure Laterality Date  . Urethral stricture dilatation    . Ankle surgery    . Achilles tendon repair     Family Hx:  Family History  Problem Relation Age of Onset  . Alzheimer's disease Father   . Diabetes Father   . CAD Father 55  . CAD      Multliple maternal aunts and uncles with early onset heart disease  . Lung cancer Sister   . ALS Mother    Social Hx:   History  Substance Use Topics  . Smoking status: Never Smoker   . Smokeless tobacco: Not on file  . Alcohol Use: Not on file   Medication:   Current Outpatient Rx  Name  Route  Sig  Dispense  Refill  . ALPRAZolam (XANAX) 0.5 MG tablet  Oral   Take 0.5 mg by mouth at bedtime as needed for anxiety.         Marland Kitchen atorvastatin (LIPITOR) 40 MG tablet   Oral   Take 40 mg by mouth daily.         . Calcium Citrate-Vitamin D (CALCIUM CITRATE + D PO)   Oral   Take 1 tablet by mouth daily.         . fexofenadine (ALLEGRA) 180 MG tablet   Oral   Take 180 mg by mouth daily.         . furosemide (LASIX) 20 MG tablet   Oral   Take 20 mg by mouth daily.         Marland Kitchen HUMULIN R 500 UNIT/ML SOLN injection   Subcutaneous   Inject 19 Units into the skin 2 (two) times daily.      3     Dispense  as written.   . Omega-3 Krill Oil 500 MG CAPS   Oral   Take 1 capsule by mouth daily.         Marland Kitchen OVER THE COUNTER MEDICATION   Oral   Take 1 tablet by mouth daily. Multi-Betic         . traMADol (ULTRAM) 50 MG tablet   Oral   Take 50 mg by mouth every 6 (six) hours as needed.         . valsartan-hydrochlorothiazide (DIOVAN-HCT) 320-25 MG per tablet   Oral   Take 1 tablet by mouth daily.      3   . Vitamin D, Ergocalciferol, (DRISDOL) 50000 UNITS CAPS capsule   Oral   Take 1 capsule by mouth 2 (two) times a week.      4   . zolpidem (AMBIEN) 5 MG tablet   Oral   Take 5 mg by mouth at bedtime as needed for sleep.            Review of Systems: Gen:  Denies  fever, sweats, chills HEENT: mild sore throat, nasal congestion with clear drainage Cvc:  No dizziness, chest pain or heaviness Resp:   Chronic shortness of breath Gi: Denies swallowing difficulty, stomach pain, nausea or vomiting, diarrhea, constipation, bowel incontinence Gu:  Denies bladder incontinence, burning urine Ext:   No Joint pain. Admit to leg swelling Skin: No skin rash, easy bruising or bleeding or hives Endoc:  No polyuria, polydipsia , polyphagia or weight change Psych: No depression, insomnia or hallucinations  Other:  All other systems negative  Allergies:  Sulfa antibiotics and Penicillins  Physical Examination:  VS: BP 128/80 mmHg  Pulse 127  Temp(Src) 97.4 F (36.3 C) (Oral)  Ht 5\' 2"  (1.575 m)  Wt 297 lb (134.718 kg)  BMI 54.31 kg/m2  SpO2 94%  General Appearance: No distress  Neuro: EXAM: without focal findings, mental status, speech normal, alert and oriented, cranial nerves 2-12 grossly normal  HEENT: PERRLA, EOM intact, no ptosis, no other lesions noticed Pulmonary:Exam: normal breath sounds., diaphragmatic excursion normal.No wheezing, No rales   Cardiovascular:@ Exam:  Normal S1,S2.  No m/r/g.     Abdomen:Exam: Benign, Soft, non-tender, No masses  Skin:   warm, no  rashes, no ecchymosis  Extremities: normal, no cyanosis, clubbing, warm with normal capillary refill.   Mild pitting edema to the mid legs.  Labs results:  BMP No results found for: NA, K, CL, CO2, GLUCOSE, BUN, CREATININE   CBC No flowsheet data found.   Rad results: (The following images  and results were reviewed by Dr. Stevenson Clinch). CT Chest 10/18/2014 CT CHEST WITHOUT CONTRAST  TECHNIQUE: Multidetector CT imaging of the chest was performed following the standard protocol without IV contrast.. COMPARISON:  None.  FINDINGS: Lungs are adequately inflated and demonstrate linear subpleural density over the posterior right lower lobe likely atelectasis and less likely an acute or chronic infectious or inflammatory process. There is a subtle focus of peripheral interstitial prominence over the inferior lateral right middle lobe which also may be due to an acute or chronic infectious or inflammatory process. Left lung is clear. No evidence of effusion. Airways are normal.  Heart is normal in size. There is no evidence of hilar or mediastinal adenopathy. Remaining mediastinal structures are normal.  Images through the upper abdomen demonstrate mild diffuse hepatic steatosis. Mild fatty atrophy of the pancreas. Couple sub cm exophytic densities over the upper pole of the left kidney too small to characterize but likely cysts. Remainder the exam is unremarkable.   IMPRESSION: Predominant linear subpleural density over the posterior right lower lobe likely atelectasis. Patchy peripheral interstitial density over the inferior lateral right middle lobe likely chronic although, although may be seen with an acute atypical infectious or inflammatory process.  Hepatic steatosis.  Two tiny sub cm exophytic densities over the upper pole of the left kidney too small to characterize but likely cysts.  6 minute walk test-276 feet/84.12 m, no desaturations below 90%, note: Has pins in her  ankles  Pulmonary function testing 11/26/2014 FVC 85% FEV1 88% FEV1/FVC 81% TLC 93 RV 89 DLCO 100 Impression: Essentially normal pulmonary function testing, no significant obstruction/restriction noted. Severely reduced ERV, most likely related to weight.   Assessment and Plan:59 year old female past medical history of stage IIIc daily, presenting for evaluation of persistent dyspnea, negative nuclear stress test, negative VQ scan, negative chest x-ray. OSA (obstructive sleep apnea)  1. OSA  Discussed sleep data and reviewed with patient.  Encouraged proper weight management.  Excessive weight may contribute to snoring.  Monitor sedative use.  Discussed driving precautions and its relationship with hypersomnolence.  Discussed operating dangerous equipment and its relationship with hypersomnolence.  Discussed sleep hygiene, and benefits of a fixed sleep waked time.  The importance of getting eight or more hours of sleep discussed with patient.  Discussed limiting the use of the computer and television before bedtime.  Decrease naps during the day, so night time sleep will become enhanced.  Limit caffeine, and sleep deprivation.  HTN, stroke, and heart failure are potential risk factors.  C-PAP Compliance:      Plan: Start non-invasive positive pressure, autopap (5-15cm H2O).        Dyspnea Multifactorial:Obesity, sleep apnea, deconditioning  At this time believe the patient's chronic shortness of breath is multifactorial in nature related to obesity and obstructive sleep apnea with deconditioning. Her 6 minute walk test is limited due to having pains in her ankles, however she was only able to walk 276 feet without any desaturations. A pulmonary function testing was essentially normal, there was no significant restriction or obstruction noted to A CT scan of the chest showed only some mild atelectasis versus inflammatory response in the inferior right lateral middle lobe.  This could be chronic, will optimize her OSA and deconditioning first, if infectious symptoms such as fever persistent cough, persistent shortness of breath continues, will then consider a bronchoscopy to look for atypical infection.  Plan: -  autopap (5-15cmH2O) for your mild sleep apnea (AHI=8.3) - Flonase (40mcg) - 2 sprays  to each nostril daily until symptom control, then decrease to 1 spray to each nostril daily and then stop when symptoms have gone.  - Weight loss, healthy diet, exercise     Severe obesity (BMI >= 40) OBESITY  Wt: 297 lbs (4 pound increase since last visit is). Discussed importance of weight reduction.  Educated regarding limitation of  intake of greasy/fried foods.  Instructed on benefit of  a low-impact exercise program, starting slowly.  Discussed benefits of 30-45 minutes of some form of exercise daily as well as benefit of supervised exercise program.  Introduced the idea of gastric bypass to the patient currently she states that this is not an option that she would like to pursue at this time.         Updated Medication List Outpatient Encounter Prescriptions as of 11/27/2014  Medication Sig  . ALPRAZolam (XANAX) 0.5 MG tablet Take 0.5 mg by mouth at bedtime as needed for anxiety.  Marland Kitchen atorvastatin (LIPITOR) 40 MG tablet Take 40 mg by mouth daily.  . Calcium Citrate-Vitamin D (CALCIUM CITRATE + D PO) Take 1 tablet by mouth daily.  . fexofenadine (ALLEGRA) 180 MG tablet Take 180 mg by mouth daily.  . furosemide (LASIX) 20 MG tablet Take 20 mg by mouth daily.  Marland Kitchen HUMULIN R 500 UNIT/ML SOLN injection Inject 19 Units into the skin 2 (two) times daily.  . Omega-3 Krill Oil 500 MG CAPS Take 1 capsule by mouth daily.  Marland Kitchen OVER THE COUNTER MEDICATION Take 1 tablet by mouth daily. Multi-Betic  . traMADol (ULTRAM) 50 MG tablet Take 50 mg by mouth every 6 (six) hours as needed.  . valsartan-hydrochlorothiazide (DIOVAN-HCT) 320-25 MG per tablet Take 1 tablet by mouth  daily.  . Vitamin D, Ergocalciferol, (DRISDOL) 50000 UNITS CAPS capsule Take 1 capsule by mouth 2 (two) times a week.  . zolpidem (AMBIEN) 5 MG tablet Take 5 mg by mouth at bedtime as needed for sleep.    Orders for this visit: Orders Placed This Encounter  Procedures  . Ambulatory Referral for DME    Referral Priority:  Routine    Referral Type:  Durable Medical Equipment Purchase    Number of Visits Requested:  1    Thank  you for the visitation and for allowing  [facility] to assist in the care of your patient. Our recommendations are noted above.  Please contact us if we can be of further service.  Vilinda Boehringer, MD [facility] Office Number: [phone]

## 2014-11-27 NOTE — Assessment & Plan Note (Signed)
  1. OSA  Discussed sleep data and reviewed with patient.  Encouraged proper weight management.  Excessive weight may contribute to snoring.  Monitor sedative use.  Discussed driving precautions and its relationship with hypersomnolence.  Discussed operating dangerous equipment and its relationship with hypersomnolence.  Discussed sleep hygiene, and benefits of a fixed sleep waked time.  The importance of getting eight or more hours of sleep discussed with patient.  Discussed limiting the use of the computer and television before bedtime.  Decrease naps during the day, so night time sleep will become enhanced.  Limit caffeine, and sleep deprivation.  HTN, stroke, and heart failure are potential risk factors.  C-PAP Compliance:      Plan: Start non-invasive positive pressure, autopap (5-15cm H2O).

## 2014-11-27 NOTE — Assessment & Plan Note (Addendum)
Multifactorial:Obesity, sleep apnea, deconditioning  At this time believe the patient's chronic shortness of breath is multifactorial in nature related to obesity and obstructive sleep apnea with deconditioning. Her 6 minute walk test is limited due to having pains in her ankles, however she was only able to walk 276 feet without any desaturations. A pulmonary function testing was essentially normal, there was no significant restriction or obstruction noted to A CT scan of the chest showed only some mild atelectasis versus inflammatory response in the inferior right lateral middle lobe. This could be chronic, will optimize her OSA and deconditioning first, if infectious symptoms such as fever persistent cough, persistent shortness of breath continues, will then consider a bronchoscopy to look for atypical infection.  Plan: -  autopap (5-15cmH2O) for your mild sleep apnea (AHI=8.3) - Flonase (40mcg) - 2 sprays to each nostril daily until symptom control, then decrease to 1 spray to each nostril daily and then stop when symptoms have gone.  - Weight loss, healthy diet, exercise

## 2014-11-27 NOTE — Patient Instructions (Signed)
Follow up in 2 months with Dr. Stevenson Clinch - we will start on on autopap (5-15cmH2O) for your mild sleep apnea (AHI=8.3) - Flonase (39mcg) - 2 sprays to each nostril daily until symptom control, then decrease to 1 spray to each nostril daily and then stop when symptoms have gone.  - healthy diet, weight loss and exercise

## 2018-08-30 DIAGNOSIS — N184 Chronic kidney disease, stage 4 (severe): Secondary | ICD-10-CM | POA: Diagnosis not present

## 2018-08-30 DIAGNOSIS — E1129 Type 2 diabetes mellitus with other diabetic kidney complication: Secondary | ICD-10-CM | POA: Diagnosis not present

## 2018-08-30 DIAGNOSIS — Z6841 Body Mass Index (BMI) 40.0 and over, adult: Secondary | ICD-10-CM | POA: Diagnosis not present

## 2018-08-30 DIAGNOSIS — I10 Essential (primary) hypertension: Secondary | ICD-10-CM | POA: Diagnosis not present

## 2018-08-30 DIAGNOSIS — Z794 Long term (current) use of insulin: Secondary | ICD-10-CM | POA: Insufficient documentation

## 2018-11-10 DIAGNOSIS — M810 Age-related osteoporosis without current pathological fracture: Secondary | ICD-10-CM | POA: Diagnosis not present

## 2018-11-10 DIAGNOSIS — R82998 Other abnormal findings in urine: Secondary | ICD-10-CM | POA: Diagnosis not present

## 2018-11-10 DIAGNOSIS — M109 Gout, unspecified: Secondary | ICD-10-CM | POA: Diagnosis not present

## 2018-11-10 DIAGNOSIS — E1129 Type 2 diabetes mellitus with other diabetic kidney complication: Secondary | ICD-10-CM | POA: Diagnosis not present

## 2018-11-10 DIAGNOSIS — E7849 Other hyperlipidemia: Secondary | ICD-10-CM | POA: Diagnosis not present

## 2018-11-10 DIAGNOSIS — D509 Iron deficiency anemia, unspecified: Secondary | ICD-10-CM | POA: Diagnosis not present

## 2018-11-17 DIAGNOSIS — R609 Edema, unspecified: Secondary | ICD-10-CM | POA: Diagnosis not present

## 2018-11-17 DIAGNOSIS — G4733 Obstructive sleep apnea (adult) (pediatric): Secondary | ICD-10-CM | POA: Diagnosis not present

## 2018-11-17 DIAGNOSIS — Z1331 Encounter for screening for depression: Secondary | ICD-10-CM | POA: Diagnosis not present

## 2018-11-17 DIAGNOSIS — N184 Chronic kidney disease, stage 4 (severe): Secondary | ICD-10-CM | POA: Diagnosis not present

## 2018-11-17 DIAGNOSIS — Z794 Long term (current) use of insulin: Secondary | ICD-10-CM | POA: Diagnosis not present

## 2018-11-17 DIAGNOSIS — M542 Cervicalgia: Secondary | ICD-10-CM | POA: Diagnosis not present

## 2018-11-17 DIAGNOSIS — R0602 Shortness of breath: Secondary | ICD-10-CM | POA: Diagnosis not present

## 2018-11-17 DIAGNOSIS — Z0000 Encounter for general adult medical examination without abnormal findings: Secondary | ICD-10-CM | POA: Diagnosis not present

## 2018-11-17 DIAGNOSIS — E1129 Type 2 diabetes mellitus with other diabetic kidney complication: Secondary | ICD-10-CM | POA: Diagnosis not present

## 2018-11-17 DIAGNOSIS — N2581 Secondary hyperparathyroidism of renal origin: Secondary | ICD-10-CM | POA: Diagnosis not present

## 2018-11-17 DIAGNOSIS — M109 Gout, unspecified: Secondary | ICD-10-CM | POA: Diagnosis not present

## 2018-12-12 DIAGNOSIS — D631 Anemia in chronic kidney disease: Secondary | ICD-10-CM | POA: Diagnosis not present

## 2018-12-12 DIAGNOSIS — N184 Chronic kidney disease, stage 4 (severe): Secondary | ICD-10-CM | POA: Diagnosis not present

## 2018-12-12 DIAGNOSIS — N2581 Secondary hyperparathyroidism of renal origin: Secondary | ICD-10-CM | POA: Diagnosis not present

## 2018-12-12 DIAGNOSIS — M797 Fibromyalgia: Secondary | ICD-10-CM | POA: Diagnosis not present

## 2018-12-12 DIAGNOSIS — M109 Gout, unspecified: Secondary | ICD-10-CM | POA: Diagnosis not present

## 2018-12-12 DIAGNOSIS — E1121 Type 2 diabetes mellitus with diabetic nephropathy: Secondary | ICD-10-CM | POA: Diagnosis not present

## 2018-12-12 DIAGNOSIS — N179 Acute kidney failure, unspecified: Secondary | ICD-10-CM | POA: Diagnosis not present

## 2018-12-12 DIAGNOSIS — I129 Hypertensive chronic kidney disease with stage 1 through stage 4 chronic kidney disease, or unspecified chronic kidney disease: Secondary | ICD-10-CM | POA: Diagnosis not present

## 2019-02-09 DIAGNOSIS — F329 Major depressive disorder, single episode, unspecified: Secondary | ICD-10-CM | POA: Diagnosis not present

## 2019-02-09 DIAGNOSIS — E1129 Type 2 diabetes mellitus with other diabetic kidney complication: Secondary | ICD-10-CM | POA: Diagnosis not present

## 2019-02-09 DIAGNOSIS — R000 Tachycardia, unspecified: Secondary | ICD-10-CM | POA: Diagnosis not present

## 2019-02-09 DIAGNOSIS — R0602 Shortness of breath: Secondary | ICD-10-CM | POA: Diagnosis not present

## 2019-02-09 DIAGNOSIS — N184 Chronic kidney disease, stage 4 (severe): Secondary | ICD-10-CM | POA: Diagnosis not present

## 2019-02-09 DIAGNOSIS — M109 Gout, unspecified: Secondary | ICD-10-CM | POA: Diagnosis not present

## 2019-02-09 DIAGNOSIS — F419 Anxiety disorder, unspecified: Secondary | ICD-10-CM | POA: Diagnosis not present

## 2019-02-09 DIAGNOSIS — G4733 Obstructive sleep apnea (adult) (pediatric): Secondary | ICD-10-CM | POA: Diagnosis not present

## 2019-02-09 DIAGNOSIS — Z794 Long term (current) use of insulin: Secondary | ICD-10-CM | POA: Diagnosis not present

## 2019-02-09 DIAGNOSIS — D509 Iron deficiency anemia, unspecified: Secondary | ICD-10-CM | POA: Diagnosis not present

## 2019-02-09 DIAGNOSIS — R609 Edema, unspecified: Secondary | ICD-10-CM | POA: Diagnosis not present

## 2019-02-24 DIAGNOSIS — E1129 Type 2 diabetes mellitus with other diabetic kidney complication: Secondary | ICD-10-CM | POA: Diagnosis not present

## 2019-02-24 DIAGNOSIS — N184 Chronic kidney disease, stage 4 (severe): Secondary | ICD-10-CM | POA: Diagnosis not present

## 2019-02-24 DIAGNOSIS — R946 Abnormal results of thyroid function studies: Secondary | ICD-10-CM | POA: Diagnosis not present

## 2019-02-24 DIAGNOSIS — D649 Anemia, unspecified: Secondary | ICD-10-CM | POA: Diagnosis not present

## 2019-02-24 DIAGNOSIS — D509 Iron deficiency anemia, unspecified: Secondary | ICD-10-CM | POA: Diagnosis not present

## 2019-03-14 DIAGNOSIS — D631 Anemia in chronic kidney disease: Secondary | ICD-10-CM | POA: Diagnosis not present

## 2019-03-14 DIAGNOSIS — M797 Fibromyalgia: Secondary | ICD-10-CM | POA: Diagnosis not present

## 2019-03-14 DIAGNOSIS — N2581 Secondary hyperparathyroidism of renal origin: Secondary | ICD-10-CM | POA: Diagnosis not present

## 2019-03-14 DIAGNOSIS — E1121 Type 2 diabetes mellitus with diabetic nephropathy: Secondary | ICD-10-CM | POA: Diagnosis not present

## 2019-03-14 DIAGNOSIS — I129 Hypertensive chronic kidney disease with stage 1 through stage 4 chronic kidney disease, or unspecified chronic kidney disease: Secondary | ICD-10-CM | POA: Diagnosis not present

## 2019-03-14 DIAGNOSIS — M109 Gout, unspecified: Secondary | ICD-10-CM | POA: Diagnosis not present

## 2019-03-14 DIAGNOSIS — N184 Chronic kidney disease, stage 4 (severe): Secondary | ICD-10-CM | POA: Diagnosis not present

## 2019-06-08 DIAGNOSIS — N184 Chronic kidney disease, stage 4 (severe): Secondary | ICD-10-CM | POA: Diagnosis not present

## 2019-06-08 DIAGNOSIS — N189 Chronic kidney disease, unspecified: Secondary | ICD-10-CM | POA: Diagnosis not present

## 2019-06-12 DIAGNOSIS — D631 Anemia in chronic kidney disease: Secondary | ICD-10-CM | POA: Diagnosis not present

## 2019-06-12 DIAGNOSIS — E1121 Type 2 diabetes mellitus with diabetic nephropathy: Secondary | ICD-10-CM | POA: Diagnosis not present

## 2019-06-12 DIAGNOSIS — R82998 Other abnormal findings in urine: Secondary | ICD-10-CM | POA: Diagnosis not present

## 2019-06-12 DIAGNOSIS — M797 Fibromyalgia: Secondary | ICD-10-CM | POA: Diagnosis not present

## 2019-06-12 DIAGNOSIS — E1165 Type 2 diabetes mellitus with hyperglycemia: Secondary | ICD-10-CM | POA: Diagnosis not present

## 2019-06-12 DIAGNOSIS — I129 Hypertensive chronic kidney disease with stage 1 through stage 4 chronic kidney disease, or unspecified chronic kidney disease: Secondary | ICD-10-CM | POA: Diagnosis not present

## 2019-06-12 DIAGNOSIS — I10 Essential (primary) hypertension: Secondary | ICD-10-CM | POA: Diagnosis not present

## 2019-06-12 DIAGNOSIS — N2581 Secondary hyperparathyroidism of renal origin: Secondary | ICD-10-CM | POA: Diagnosis not present

## 2019-06-12 DIAGNOSIS — M109 Gout, unspecified: Secondary | ICD-10-CM | POA: Diagnosis not present

## 2019-06-12 DIAGNOSIS — N184 Chronic kidney disease, stage 4 (severe): Secondary | ICD-10-CM | POA: Diagnosis not present

## 2019-06-21 DIAGNOSIS — R946 Abnormal results of thyroid function studies: Secondary | ICD-10-CM | POA: Diagnosis not present

## 2019-10-27 ENCOUNTER — Ambulatory Visit: Payer: 59 | Attending: Internal Medicine

## 2019-10-27 ENCOUNTER — Other Ambulatory Visit: Payer: Self-pay

## 2019-10-27 DIAGNOSIS — Z23 Encounter for immunization: Secondary | ICD-10-CM

## 2019-10-27 NOTE — Progress Notes (Signed)
   Covid-19 Vaccination Clinic  Name:  Druscilla Petsch    MRN: [id number] DOB: Apr 01, 1956  10/27/2019  Ms. Parrella was observed post Covid-19 immunization for 15 minutes without incident. She was provided with Vaccine Information Sheet and instruction to access the V-Safe system.   Ms. Strahm was instructed to call 911 with any severe reactions post vaccine: Marland Kitchen Difficulty breathing  . Swelling of face and throat  . A fast heartbeat  . A bad rash all over body  . Dizziness and weakness   Immunizations Administered    Name Date Dose VIS Date Route   Pfizer COVID-19 Vaccine 10/27/2019  1:30 PM 0.3 mL 07/14/2019 Intramuscular   Manufacturer: [place]   Lot: [id number]   [place]: [id number]

## 2019-11-21 ENCOUNTER — Ambulatory Visit: Payer: 59 | Attending: Internal Medicine

## 2019-11-21 DIAGNOSIS — Z23 Encounter for immunization: Secondary | ICD-10-CM

## 2019-11-21 NOTE — Progress Notes (Signed)
   Covid-19 Vaccination Clinic  Name:  Antionetta Ator    MRN: [id number] DOB: [birth year]  11/21/2019  Ms. Cavenaugh was observed post Covid-19 immunization for 15 minutes without incident. She was provided with Vaccine Information Sheet and instruction to access the V-Safe system.   Ms. Lagrange was instructed to call 911 with any severe reactions post vaccine: Marland Kitchen Difficulty breathing  . Swelling of face and throat  . A fast heartbeat  . A bad rash all over body  . Dizziness and weakness   Immunizations Administered    Name Date Dose VIS Date Route   Pfizer COVID-19 Vaccine 11/21/2019  1:26 PM 0.3 mL 09/27/2018 Intramuscular   Manufacturer: [place]   Lot: [id number]   [place]: [id number]

## 2020-07-19 ENCOUNTER — Ambulatory Visit: Payer: 59 | Attending: Internal Medicine

## 2020-07-19 ENCOUNTER — Other Ambulatory Visit: Payer: Self-pay | Admitting: Internal Medicine

## 2020-07-19 DIAGNOSIS — Z23 Encounter for immunization: Secondary | ICD-10-CM

## 2020-07-19 NOTE — Progress Notes (Signed)
   Covid-19 Vaccination Clinic  Name:  Theresa Warren    MRN: [id number] DOB: [birth year]  07/19/2020  [name] was observed post Covid-19 immunization for 15 minutes without incident. She was provided with Vaccine Information Sheet and instruction to access the V-Safe system.   [name] was instructed to call 911 with any severe reactions post vaccine: Marland Kitchen Difficulty breathing  . Swelling of face and throat  . A fast heartbeat  . A bad rash all over body  . Dizziness and weakness   Immunizations Administered    Name Date Dose VIS Date Route   Pfizer COVID-19 Vaccine 07/19/2020  1:33 PM 0.3 mL 05/22/2020 Intramuscular   Manufacturer: [place]   Lot: [id number]   [place]: [id number]

## 2020-12-31 ENCOUNTER — Ambulatory Visit: Payer: 59 | Attending: Internal Medicine

## 2020-12-31 ENCOUNTER — Other Ambulatory Visit: Payer: Self-pay

## 2020-12-31 DIAGNOSIS — Z23 Encounter for immunization: Secondary | ICD-10-CM

## 2020-12-31 MED ORDER — PFIZER-BIONT COVID-19 VAC-TRIS 30 MCG/0.3ML IM SUSP
INTRAMUSCULAR | 0 refills | Status: DC
  Filled 2020-12-31: qty 0.3, 1d supply, fill #0

## 2020-12-31 NOTE — Progress Notes (Signed)
   Covid-19 Vaccination Clinic  Name:  Theresa Warren    MRN: [id number] DOB: 02/19/1956  12/31/2020  [name] was observed post Covid-19 immunization for 15 minutes without incident. She was provided with Vaccine Information Sheet and instruction to access the V-Safe system.   [name] was instructed to call 911 with any severe reactions post vaccine: Marland Kitchen Difficulty breathing  . Swelling of face and throat  . A fast heartbeat  . A bad rash all over body  . Dizziness and weakness   Immunizations Administered    Name Date Dose VIS Date Route   PFIZER Comrnaty([place] TOP) Covid-19 Vaccine 12/31/2020  1:35 PM 0.3 mL 07/11/2020 Intramuscular   Manufacturer: [place]   Lot: [id number]   [place]: [phone]

## 2021-08-20 ENCOUNTER — Other Ambulatory Visit (HOSPITAL_COMMUNITY): Payer: Self-pay

## 2021-11-02 ENCOUNTER — Other Ambulatory Visit: Payer: Self-pay

## 2021-11-02 DIAGNOSIS — N189 Chronic kidney disease, unspecified: Secondary | ICD-10-CM

## 2021-11-10 ENCOUNTER — Ambulatory Visit (INDEPENDENT_AMBULATORY_CARE_PROVIDER_SITE_OTHER)
Admission: RE | Admit: 2021-11-10 | Discharge: 2021-11-10 | Disposition: A | Discharge status: Home / Self Care | Payer: Medicare Other | Source: Ambulatory Visit | Attending: Vascular Surgery | Admitting: Vascular Surgery

## 2021-11-10 ENCOUNTER — Ambulatory Visit (HOSPITAL_COMMUNITY)
Admission: RE | Admit: 2021-11-10 | Discharge: 2021-11-10 | Disposition: A | Discharge status: Home / Self Care | Payer: Medicare Other | Source: Ambulatory Visit | Attending: Vascular Surgery | Admitting: Vascular Surgery

## 2021-11-10 DIAGNOSIS — N189 Chronic kidney disease, unspecified: Secondary | ICD-10-CM

## 2021-11-20 ENCOUNTER — Encounter: Payer: 59 | Admitting: Vascular Surgery

## 2021-12-04 ENCOUNTER — Other Ambulatory Visit: Payer: Self-pay

## 2021-12-04 DIAGNOSIS — I824Y9 Acute embolism and thrombosis of unspecified deep veins of unspecified proximal lower extremity: Secondary | ICD-10-CM

## 2021-12-04 NOTE — Addendum Note (Signed)
Addended byDoylene Bode on: 12/04/2021 04:56 PM ? ? Modules accepted: Orders ? ?

## 2021-12-05 ENCOUNTER — Other Ambulatory Visit (HOSPITAL_COMMUNITY): Payer: Self-pay | Admitting: Internal Medicine

## 2021-12-05 ENCOUNTER — Ambulatory Visit (HOSPITAL_COMMUNITY)
Admission: RE | Admit: 2021-12-05 | Discharge: 2021-12-05 | Disposition: A | Discharge status: Home / Self Care | Payer: Medicare Other | Source: Ambulatory Visit | Attending: Internal Medicine | Admitting: Internal Medicine

## 2021-12-05 DIAGNOSIS — M79605 Pain in left leg: Secondary | ICD-10-CM | POA: Insufficient documentation

## 2021-12-05 DIAGNOSIS — M79604 Pain in right leg: Secondary | ICD-10-CM | POA: Diagnosis not present

## 2021-12-18 ENCOUNTER — Inpatient Hospital Stay
Admission: EM | Admit: 2021-12-18 | Discharge: 2021-12-23 | DRG: 683 | Disposition: A | Discharge status: Skilled Nursing Facility | Payer: Medicare Other | Attending: Internal Medicine | Admitting: Internal Medicine

## 2021-12-18 ENCOUNTER — Emergency Department: Payer: Medicare Other

## 2021-12-18 ENCOUNTER — Other Ambulatory Visit: Payer: Self-pay

## 2021-12-18 ENCOUNTER — Encounter: Payer: 59 | Admitting: Vascular Surgery

## 2021-12-18 DIAGNOSIS — D631 Anemia in chronic kidney disease: Secondary | ICD-10-CM | POA: Diagnosis present

## 2021-12-18 DIAGNOSIS — Z91199 Patient's noncompliance with other medical treatment and regimen due to unspecified reason: Secondary | ICD-10-CM

## 2021-12-18 DIAGNOSIS — N189 Chronic kidney disease, unspecified: Secondary | ICD-10-CM

## 2021-12-18 DIAGNOSIS — W19XXXA Unspecified fall, initial encounter: Secondary | ICD-10-CM

## 2021-12-18 DIAGNOSIS — E860 Dehydration: Secondary | ICD-10-CM | POA: Diagnosis not present

## 2021-12-18 DIAGNOSIS — E1165 Type 2 diabetes mellitus with hyperglycemia: Secondary | ICD-10-CM | POA: Diagnosis present

## 2021-12-18 DIAGNOSIS — Z8249 Family history of ischemic heart disease and other diseases of the circulatory system: Secondary | ICD-10-CM

## 2021-12-18 DIAGNOSIS — R296 Repeated falls: Secondary | ICD-10-CM | POA: Diagnosis present

## 2021-12-18 DIAGNOSIS — Z6837 Body mass index (BMI) 37.0-37.9, adult: Secondary | ICD-10-CM

## 2021-12-18 DIAGNOSIS — R531 Weakness: Principal | ICD-10-CM

## 2021-12-18 DIAGNOSIS — Z780 Asymptomatic menopausal state: Secondary | ICD-10-CM

## 2021-12-18 DIAGNOSIS — R319 Hematuria, unspecified: Secondary | ICD-10-CM | POA: Diagnosis present

## 2021-12-18 DIAGNOSIS — N186 End stage renal disease: Secondary | ICD-10-CM | POA: Diagnosis present

## 2021-12-18 DIAGNOSIS — Z880 Allergy status to penicillin: Secondary | ICD-10-CM

## 2021-12-18 DIAGNOSIS — G4733 Obstructive sleep apnea (adult) (pediatric): Secondary | ICD-10-CM | POA: Diagnosis present

## 2021-12-18 DIAGNOSIS — R000 Tachycardia, unspecified: Secondary | ICD-10-CM | POA: Diagnosis present

## 2021-12-18 DIAGNOSIS — L8990 Pressure ulcer of unspecified site, unspecified stage: Secondary | ICD-10-CM | POA: Insufficient documentation

## 2021-12-18 DIAGNOSIS — F32A Depression, unspecified: Secondary | ICD-10-CM | POA: Diagnosis present

## 2021-12-18 DIAGNOSIS — W010XXA Fall on same level from slipping, tripping and stumbling without subsequent striking against object, initial encounter: Secondary | ICD-10-CM | POA: Diagnosis present

## 2021-12-18 DIAGNOSIS — I120 Hypertensive chronic kidney disease with stage 5 chronic kidney disease or end stage renal disease: Secondary | ICD-10-CM | POA: Diagnosis present

## 2021-12-18 DIAGNOSIS — Z833 Family history of diabetes mellitus: Secondary | ICD-10-CM

## 2021-12-18 DIAGNOSIS — Z801 Family history of malignant neoplasm of trachea, bronchus and lung: Secondary | ICD-10-CM

## 2021-12-18 DIAGNOSIS — Z881 Allergy status to other antibiotic agents status: Secondary | ICD-10-CM

## 2021-12-18 DIAGNOSIS — T796XXA Traumatic ischemia of muscle, initial encounter: Secondary | ICD-10-CM | POA: Diagnosis present

## 2021-12-18 DIAGNOSIS — Z9181 History of falling: Secondary | ICD-10-CM

## 2021-12-18 DIAGNOSIS — E1122 Type 2 diabetes mellitus with diabetic chronic kidney disease: Secondary | ICD-10-CM

## 2021-12-18 DIAGNOSIS — E785 Hyperlipidemia, unspecified: Secondary | ICD-10-CM | POA: Diagnosis present

## 2021-12-18 DIAGNOSIS — Z885 Allergy status to narcotic agent status: Secondary | ICD-10-CM

## 2021-12-18 DIAGNOSIS — I10 Essential (primary) hypertension: Secondary | ICD-10-CM | POA: Diagnosis present

## 2021-12-18 DIAGNOSIS — D62 Acute posthemorrhagic anemia: Secondary | ICD-10-CM | POA: Diagnosis present

## 2021-12-18 DIAGNOSIS — E872 Acidosis, unspecified: Secondary | ICD-10-CM | POA: Diagnosis present

## 2021-12-18 DIAGNOSIS — L89322 Pressure ulcer of left buttock, stage 2: Secondary | ICD-10-CM | POA: Diagnosis present

## 2021-12-18 DIAGNOSIS — K7581 Nonalcoholic steatohepatitis (NASH): Secondary | ICD-10-CM | POA: Diagnosis present

## 2021-12-18 DIAGNOSIS — Z66 Do not resuscitate: Secondary | ICD-10-CM | POA: Diagnosis present

## 2021-12-18 DIAGNOSIS — G894 Chronic pain syndrome: Secondary | ICD-10-CM | POA: Diagnosis present

## 2021-12-18 DIAGNOSIS — N185 Chronic kidney disease, stage 5: Secondary | ICD-10-CM | POA: Diagnosis not present

## 2021-12-18 DIAGNOSIS — L89312 Pressure ulcer of right buttock, stage 2: Secondary | ICD-10-CM | POA: Diagnosis present

## 2021-12-18 DIAGNOSIS — N939 Abnormal uterine and vaginal bleeding, unspecified: Secondary | ICD-10-CM

## 2021-12-18 DIAGNOSIS — Z794 Long term (current) use of insulin: Secondary | ICD-10-CM

## 2021-12-18 DIAGNOSIS — E1151 Type 2 diabetes mellitus with diabetic peripheral angiopathy without gangrene: Secondary | ICD-10-CM | POA: Diagnosis present

## 2021-12-18 DIAGNOSIS — N950 Postmenopausal bleeding: Secondary | ICD-10-CM | POA: Diagnosis present

## 2021-12-18 DIAGNOSIS — E876 Hypokalemia: Secondary | ICD-10-CM | POA: Diagnosis present

## 2021-12-18 DIAGNOSIS — Y92009 Unspecified place in unspecified non-institutional (private) residence as the place of occurrence of the external cause: Secondary | ICD-10-CM

## 2021-12-18 DIAGNOSIS — Z820 Family history of epilepsy and other diseases of the nervous system: Secondary | ICD-10-CM

## 2021-12-18 DIAGNOSIS — M7989 Other specified soft tissue disorders: Secondary | ICD-10-CM | POA: Diagnosis present

## 2021-12-18 DIAGNOSIS — Z882 Allergy status to sulfonamides status: Secondary | ICD-10-CM

## 2021-12-18 DIAGNOSIS — N179 Acute kidney failure, unspecified: Principal | ICD-10-CM | POA: Diagnosis present

## 2021-12-18 DIAGNOSIS — Z79899 Other long term (current) drug therapy: Secondary | ICD-10-CM

## 2021-12-18 DIAGNOSIS — S2231XA Fracture of one rib, right side, initial encounter for closed fracture: Secondary | ICD-10-CM | POA: Diagnosis present

## 2021-12-18 LAB — COMPREHENSIVE METABOLIC PANEL
ALT: 35 U/L (ref 0–44)
AST: 42 U/L — ABNORMAL HIGH (ref 15–41)
Albumin: 2.7 g/dL — ABNORMAL LOW (ref 3.5–5.0)
Alkaline Phosphatase: 157 U/L — ABNORMAL HIGH (ref 38–126)
Anion gap: 17 — ABNORMAL HIGH (ref 5–15)
BUN: 101 mg/dL — ABNORMAL HIGH (ref 8–23)
CO2: 13 mmol/L — ABNORMAL LOW (ref 22–32)
Calcium: 9.7 mg/dL (ref 8.9–10.3)
Chloride: 107 mmol/L (ref 98–111)
Creatinine, Ser: 5.54 mg/dL — ABNORMAL HIGH (ref 0.44–1.00)
GFR, Estimated: 8 mL/min — ABNORMAL LOW (ref >60)
Glucose, Bld: 453 mg/dL — ABNORMAL HIGH (ref 70–99)
Potassium: 5.1 mmol/L (ref 3.5–5.1)
Sodium: 137 mmol/L (ref 135–145)
Total Bilirubin: 0.8 mg/dL (ref 0.3–1.2)
Total Protein: 7.2 g/dL (ref 6.5–8.1)

## 2021-12-18 LAB — CBG MONITORING, ED: Glucose-Capillary: 457 mg/dL — ABNORMAL HIGH (ref 70–99)

## 2021-12-18 LAB — CBC WITH DIFFERENTIAL/PLATELET
Abs Immature Granulocytes: 0.11 10*3/uL — ABNORMAL HIGH (ref 0.00–0.07)
Basophils Absolute: 0 10*3/uL (ref 0.0–0.1)
Basophils Relative: 0 %
Eosinophils Absolute: 0 10*3/uL (ref 0.0–0.5)
Eosinophils Relative: 0 %
HCT: 24 % — ABNORMAL LOW (ref 36.0–46.0)
Hemoglobin: 7.6 g/dL — ABNORMAL LOW (ref 12.0–15.0)
Immature Granulocytes: 1 %
Lymphocytes Relative: 7 %
Lymphs Abs: 1.1 10*3/uL (ref 0.7–4.0)
MCH: 31.4 pg (ref 26.0–34.0)
MCHC: 31.7 g/dL (ref 30.0–36.0)
MCV: 99.2 fL (ref 80.0–100.0)
Monocytes Absolute: 0.6 10*3/uL (ref 0.1–1.0)
Monocytes Relative: 4 %
Neutro Abs: 14.7 10*3/uL — ABNORMAL HIGH (ref 1.7–7.7)
Neutrophils Relative %: 88 %
Platelets: 417 10*3/uL — ABNORMAL HIGH (ref 150–400)
RBC: 2.42 MIL/uL — ABNORMAL LOW (ref 3.87–5.11)
RDW: 13.4 % (ref 11.5–15.5)
WBC: 16.6 10*3/uL — ABNORMAL HIGH (ref 4.0–10.5)
nRBC: 0 % (ref 0.0–0.2)

## 2021-12-18 LAB — URINALYSIS, ROUTINE W REFLEX MICROSCOPIC
Bilirubin Urine: NEGATIVE
Glucose, UA: >=500 mg/dL — AB
Ketones, ur: 5 mg/dL — AB
Nitrite: NEGATIVE
Protein, ur: 100 mg/dL — AB
RBC / HPF: >50 RBC/hpf — ABNORMAL HIGH (ref 0–5)
Specific Gravity, Urine: 1.013 (ref 1.005–1.030)
Squamous Epithelial / LPF: NONE SEEN (ref 0–5)
WBC, UA: >50 WBC/hpf — ABNORMAL HIGH (ref 0–5)
pH: 5 (ref 5.0–8.0)

## 2021-12-18 LAB — GLUCOSE, CAPILLARY
Glucose-Capillary: 480 mg/dL — ABNORMAL HIGH (ref 70–99)
Glucose-Capillary: 511 mg/dL (ref 70–99)

## 2021-12-18 LAB — BLOOD GAS, VENOUS
Acid-base deficit: 13.4 mmol/L — ABNORMAL HIGH (ref 0.0–2.0)
Bicarbonate: 13.1 mmol/L — ABNORMAL LOW (ref 20.0–28.0)
O2 Saturation: 57.4 %
Patient temperature: 37
pCO2, Ven: 32 mmHg — ABNORMAL LOW (ref 44–60)
pH, Ven: 7.22 — ABNORMAL LOW (ref 7.25–7.43)
pO2, Ven: 37 mmHg (ref 32–45)

## 2021-12-18 LAB — TROPONIN I (HIGH SENSITIVITY)
Troponin I (High Sensitivity): 20 ng/L — ABNORMAL HIGH (ref <18)
Troponin I (High Sensitivity): 21 ng/L — ABNORMAL HIGH (ref <18)

## 2021-12-18 LAB — LACTIC ACID, PLASMA
Lactic Acid, Venous: 1.5 mmol/L (ref 0.5–1.9)
Lactic Acid, Venous: 1.1 mmol/L (ref 0.5–1.9)

## 2021-12-18 LAB — CK: Total CK: 270 U/L — ABNORMAL HIGH (ref 38–234)

## 2021-12-18 LAB — HEMOGLOBIN A1C
Hgb A1c MFr Bld: 10 % — ABNORMAL HIGH (ref 4.8–5.6)
Mean Plasma Glucose: 240.3 mg/dL

## 2021-12-18 MED ORDER — INSULIN ASPART 100 UNIT/ML IJ SOLN: 0.0000 [IU] | Freq: Every day | INTRAMUSCULAR | Status: DC

## 2021-12-18 MED ORDER — INSULIN ASPART 100 UNIT/ML IJ SOLN: 0.0000 [IU] | Freq: Three times a day (TID) | INTRAMUSCULAR | Status: DC

## 2021-12-18 MED ORDER — INSULIN ASPART 100 UNIT/ML IJ SOLN
15.0000 [IU] | Freq: Once | INTRAMUSCULAR | Status: AC
  Administered 2021-12-18: 15 [IU] via SUBCUTANEOUS
  Filled 2021-12-18: qty 1

## 2021-12-18 MED ORDER — SODIUM CHLORIDE 0.9 % IV BOLUS
500.0000 mL | Freq: Once | INTRAVENOUS | Status: AC
  Administered 2021-12-18: 500 mL via INTRAVENOUS

## 2021-12-18 MED ORDER — HEPARIN SODIUM (PORCINE) 5000 UNIT/ML IJ SOLN
5000.0000 [IU] | Freq: Three times a day (TID) | INTRAMUSCULAR | Status: DC
  Administered 2021-12-18 – 2021-12-23 (×13): 5000 [IU] via SUBCUTANEOUS
  Filled 2021-12-18 (×13): qty 1

## 2021-12-18 MED ORDER — ONDANSETRON HCL 4 MG PO TABS
4.0000 mg | ORAL_TABLET | Freq: Four times a day (QID) | ORAL | Status: DC | PRN
  Administered 2021-12-21: 4 mg via ORAL
  Filled 2021-12-18: qty 1

## 2021-12-18 MED ORDER — ONDANSETRON HCL 4 MG/2ML IJ SOLN
4.0000 mg | Freq: Four times a day (QID) | INTRAMUSCULAR | Status: DC | PRN
  Administered 2021-12-22: 4 mg via INTRAVENOUS
  Filled 2021-12-18: qty 2

## 2021-12-18 MED ORDER — ACETAMINOPHEN 325 MG PO TABS
650.0000 mg | ORAL_TABLET | Freq: Four times a day (QID) | ORAL | Status: DC | PRN
  Administered 2021-12-18 – 2021-12-22 (×3): 650 mg via ORAL
  Filled 2021-12-18 (×3): qty 2

## 2021-12-18 MED ORDER — ATORVASTATIN CALCIUM 20 MG PO TABS
40.0000 mg | ORAL_TABLET | Freq: Every day | ORAL | Status: DC
  Administered 2021-12-18 – 2021-12-23 (×6): 40 mg via ORAL
  Filled 2021-12-18 (×6): qty 2

## 2021-12-18 MED ORDER — SODIUM BICARBONATE 650 MG PO TABS
650.0000 mg | ORAL_TABLET | Freq: Two times a day (BID) | ORAL | Status: DC
  Administered 2021-12-18 – 2021-12-20 (×4): 650 mg via ORAL
  Filled 2021-12-18 (×4): qty 1

## 2021-12-18 MED ORDER — CLONIDINE HCL 0.1 MG PO TABS: 0.1000 mg | ORAL_TABLET | ORAL | Status: DC | PRN

## 2021-12-18 MED ORDER — ACETAMINOPHEN 650 MG RE SUPP: 650.0000 mg | Freq: Four times a day (QID) | RECTAL | Status: DC | PRN

## 2021-12-18 MED ORDER — SODIUM CHLORIDE 0.9 % IV SOLN: INTRAVENOUS | Status: DC

## 2021-12-18 NOTE — H&P (Addendum)
History and Physical    Theresa Warren LFY:[id number] DOB: 1956-07-22 DOA: 12/18/2021  DOS: the patient was seen and examined on 12/18/2021  PCP: Crist Infante, MD   Patient coming from: Home  I have personally briefly reviewed patient's old medical records in [place] [facility]  Chief complaint: Found on the floor. History of present illness: 66 year old female history of type 2 diabetes on insulin, CKD stage V, hypertension, chronic pain syndrome, hypertension presents to the ER today after a reported fall at home.  Patient states that she fell in the bathroom on Tuesday morning May 16,023.  She states this is around 9:30 in the morning.  She states that she got up to use the bathroom.  She slipped in the bathroom.  She landed on her back.  She was finally able to sit up in the bathroom.  She was unable to get her legs underneath her.  She is unable to stand.  She spent the last 2 days on the floor.  Unclear how she arrived to the ER.  Triage notes the patient was covered in feces and urine.  She was cleaned up.  Reported the patient has been missing meds for 2 days.  She states that she was able to drink a little bit of water from the bathroom.  On arrival temp 97.7 heart rate 110 blood pressure 164/129 satting 100% on room air.  BUN of 101, creatinine 5.5, bicarb of 13  CK of 270  Lactic acid 1.5  Serum glucose of 457 White count is 16.6, hemoglobin 7.6, platelets of 417  Lower extremity ultrasound negative for DVT.  Pelvic x-rays negative for hip fractures.    CT head negative for acute intracranial abnormality.  CT C-spine negative for acute fractures.  [facility] hospitalist contacted for admission.     ED Course: BUN 101, Scr 5.5  Review of Systems:  Review of Systems  Constitutional:  Positive for malaise/fatigue.  HENT: Negative.    Eyes: Negative.   Respiratory: Negative.    Cardiovascular: Negative.   Gastrointestinal: Negative.  Negative for nausea and  vomiting.  Genitourinary: Negative.   Musculoskeletal:  Positive for back pain and falls.  Skin: Negative.   Neurological: Negative.   Endo/Heme/Allergies: Negative.   Psychiatric/Behavioral: Negative.    All other systems reviewed and are negative.  Past Medical History:  Diagnosis Date   CKD (chronic kidney disease), stage III ([place])    Diabetes ([place])    Hyperlipidemia    Hypertension    Obesity     Past Surgical History:  Procedure Laterality Date   ACHILLES TENDON REPAIR     ANKLE SURGERY     URETHRAL STRICTURE DILATATION       reports that she has never smoked. She does not have any smokeless tobacco history on file. She reports that she does not currently use alcohol. She reports that she does not currently use drugs.  Allergies  Allergen Reactions   Codeine Nausea And Vomiting   Sulfa Antibiotics Swelling   Clindamycin/Lincomycin Rash   Penicillins Rash    Family History  Problem Relation Age of Onset   Alzheimer's disease Father    Diabetes Father    CAD Father 27   CAD Other        Multliple maternal aunts and uncles with early onset heart disease   Lung cancer Sister    ALS Mother     Prior to Admission medications   Medication Sig Start Date End Date Taking? Authorizing  Provider  allopurinol (ZYLOPRIM) 100 MG tablet Take 100 mg by mouth daily.    [provider]  ALPRAZolam Duanne Moron) 0.5 MG tablet Take 0.5 mg by mouth at bedtime as needed for anxiety.    [provider]  amLODipine (NORVASC) 5 MG tablet Take 5 mg by mouth at bedtime. 07/29/21   [provider]  atorvastatin (LIPITOR) 40 MG tablet Take 40 mg by mouth daily.    [provider]  Calcium Citrate-Vitamin D (CALCIUM CITRATE + D PO) Take 1 tablet by mouth daily.    [provider]  COVID-19 mRNA Vac-TriS, Pfizer, (PFIZER-BIONT COVID-19 VAC-TRIS) SUSP injection Inject into the muscle. 12/31/20   Carlyle Basques, MD  fexofenadine (ALLEGRA) 180 MG tablet  Take 180 mg by mouth daily.    [provider]  fluticasone (FLONASE) 50 MCG/ACT nasal spray Place 2 sprays into both nostrils daily. 11/27/14 11/27/15  Vilinda Boehringer, MD  furosemide (LASIX) 20 MG tablet Take 20 mg by mouth daily.    [provider]  HUMULIN R 500 UNIT/ML SOLN injection Inject 19 Units into the skin 2 (two) times daily. 07/10/14   [provider]  nystatin (MYCOSTATIN/NYSTOP) powder Apply topically. 10/28/21   [provider]  Omega-3 Krill Oil 500 MG CAPS Take 1 capsule by mouth daily.    [provider]  OVER THE COUNTER MEDICATION Take 1 tablet by mouth daily. Multi-Betic    [provider]  traMADol (ULTRAM) 50 MG tablet Take 50 mg by mouth every 6 (six) hours as needed.    [provider]  triamcinolone cream (KENALOG) 0.1 % Apply 1 application. topically 3 (three) times daily. 11/12/21   [provider]  valsartan-hydrochlorothiazide (DIOVAN-HCT) 320-25 MG per tablet Take 1 tablet by mouth daily. 08/15/14   [provider]  Vitamin D, Ergocalciferol, (DRISDOL) 50000 UNITS CAPS capsule Take 1 capsule by mouth 2 (two) times a week. 07/22/14   [provider]  zolpidem (AMBIEN) 5 MG tablet Take 5 mg by mouth at bedtime as needed for sleep.    [provider]    Physical Exam: Vitals:   12/18/21 1200 12/18/21 1203 12/18/21 1330 12/18/21 1400  BP: (!) 129/54   115/76  Pulse: (!) 107  (!) 108   Resp: 18  (!) 22 20  Temp:      SpO2: 100%  100%   Weight:  99.3 kg    Height:  5\' 3"  (1.6 m)      Physical Exam Vitals and nursing note reviewed.  Constitutional:      General: She is not in acute distress.    Appearance: She is obese. She is not ill-appearing, toxic-appearing or diaphoretic.  HENT:     Head: Normocephalic and atraumatic.     Nose: Nose normal.  Eyes:     General: No scleral icterus. Cardiovascular:     Rate and Rhythm: Normal rate and regular rhythm.      Pulses: Normal pulses.  Pulmonary:     Effort: Pulmonary effort is normal. No respiratory distress.     Breath sounds: No wheezing or rales.  Abdominal:     General: Bowel sounds are normal. There is no distension.     Tenderness: There is no abdominal tenderness. There is no guarding.  Musculoskeletal:     Right lower leg: No edema.     Left lower leg: No edema.  Skin:    General: Skin is warm and dry.     Capillary  Refill: Capillary refill takes less than 2 seconds.  Neurological:     General: No focal deficit present.     Mental Status: She is alert and oriented to person, place, and time.     Labs on Admission: I have personally reviewed following labs and imaging studies  CBC: Recent Labs  Lab 12/18/21 1213  WBC 16.6*  NEUTROABS 14.7*  HGB 7.6*  HCT 24.0*  MCV 99.2  PLT 829*   Basic Metabolic Panel: Recent Labs  Lab 12/18/21 1213  NA 137  K 5.1  CL 107  CO2 13*  GLUCOSE 453*  BUN 101*  CREATININE 5.54*  CALCIUM 9.7   GFR: Estimated Creatinine Clearance: 11.5 mL/min (A) (by C-G formula based on SCr of 5.54 mg/dL (H)). Liver Function Tests: Recent Labs  Lab 12/18/21 1213  AST 42*  ALT 35  ALKPHOS 157*  BILITOT 0.8  PROT 7.2  ALBUMIN 2.7*   No results for input(s): LIPASE, AMYLASE in the last 168 hours. No results for input(s): AMMONIA in the last 168 hours. Coagulation Profile: No results for input(s): INR, PROTIME in the last 168 hours. Cardiac Enzymes: Recent Labs  Lab 12/18/21 1213  CKTOTAL 270*  TROPONINIHS 20*   BNP (last 3 results) No results for input(s): PROBNP in the last 8760 hours. HbA1C: No results for input(s): HGBA1C in the last 72 hours. CBG: Recent Labs  Lab 12/18/21 1201  GLUCAP 457*   Lipid Profile: No results for input(s): CHOL, HDL, LDLCALC, TRIG, CHOLHDL, LDLDIRECT in the last 72 hours. Thyroid Function Tests: No results for input(s): TSH, T4TOTAL, FREET4, T3FREE, THYROIDAB in the last 72 hours. Anemia  Panel: No results for input(s): VITAMINB12, FOLATE, FERRITIN, TIBC, IRON, RETICCTPCT in the last 72 hours. Urine analysis: No results found for: COLORURINE, APPEARANCEUR, LABSPEC, Cook, GLUCOSEU, HGBUR, BILIRUBINUR, KETONESUR, PROTEINUR, UROBILINOGEN, NITRITE, LEUKOCYTESUR  Radiological Exams on Admission: I have personally reviewed images DG Chest 2 View  Result Date: 12/18/2021 CLINICAL DATA:  Fall, weakness EXAM: CHEST - 2 VIEW COMPARISON:  None Available. FINDINGS: Patient rotation. No consolidation. No visible pleural effusions or pneumothorax. Cardiomediastinal silhouette is within normal limits when accounting for patient rotation. Mildly displaced right lateral seventh rib fracture IMPRESSION: 1. Mildly displaced right lateral seventh rib fracture, potentially acute. 2. No other acute abnormality identified. Electronically Signed   By: Margaretha Sheffield M.D.   On: 12/18/2021 13:23   DG Pelvis 1-2 Views  Result Date: 12/18/2021 CLINICAL DATA:  Fall 2 days ago EXAM: PELVIS - 1-2 VIEW COMPARISON:  None Available. FINDINGS: Patient is rotated. There is no evidence of pelvic fracture or diastasis. No pelvic bone lesions are seen. Prominent atherosclerotic vascular calcifications. IMPRESSION: Negative. Electronically Signed   By: Davina Poke D.O.   On: 12/18/2021 13:22   CT Head Wo Contrast  Result Date: 12/18/2021 CLINICAL DATA:  Head trauma EXAM: CT HEAD WITHOUT CONTRAST TECHNIQUE: Contiguous axial images were obtained from the base of the skull through the vertex without intravenous contrast. RADIATION DOSE REDUCTION: This exam was performed according to the departmental dose-optimization program which includes automated exposure control, adjustment of the mA and/or kV according to patient size and/or use of iterative reconstruction technique. COMPARISON:  None Available. FINDINGS: Brain: No acute intracranial hemorrhage, mass effect, or herniation. No extra-axial fluid collections. No  evidence of acute territorial infarct. No hydrocephalus. Mild cortical volume loss. Mild patchy hypodensities in the periventricular and subcortical white matter, likely secondary to chronic microvascular ischemic changes. Vascular: Calcified plaques in the carotid  siphons. Skull: Normal. Negative for fracture or focal lesion. Sinuses/Orbits: No acute finding. Other: None. IMPRESSION: Chronic changes with no acute intracranial process identified. Electronically Signed   By: Ofilia Neas M.D.   On: 12/18/2021 13:04   CT Cervical Spine Wo Contrast  Result Date: 12/18/2021 CLINICAL DATA:  Neck trauma EXAM: CT CERVICAL SPINE WITHOUT CONTRAST TECHNIQUE: Multidetector CT imaging of the cervical spine was performed without intravenous contrast. Multiplanar CT image reconstructions were also generated. RADIATION DOSE REDUCTION: This exam was performed according to the departmental dose-optimization program which includes automated exposure control, adjustment of the mA and/or kV according to patient size and/or use of iterative reconstruction technique. COMPARISON:  None Available. FINDINGS: Alignment: Grade 1 anterolisthesis of C6 on C7. No acute subluxation. Skull base and vertebrae: No acute fracture. No primary bone lesion or focal pathologic process. Soft tissues and spinal canal: No prevertebral fluid or swelling. No visible canal hematoma. Disc levels: Mild to moderate intervertebral disc height loss at C4-C5 and C5-C6 with accompanying endplate osteophytes and uncovertebral spurring. Bilateral mild facet arthropathy. Upper chest: No acute process identified. Other: Coarse calcified plaques noted in the right proximal ICA. IMPRESSION: No acute fracture or malalignment identified. Chronic findings as described. Electronically Signed   By: Ofilia Neas M.D.   On: 12/18/2021 13:07   US Venous Img Lower Unilateral Left  Result Date: 12/18/2021 CLINICAL DATA:  Unilateral leg swelling EXAM: LEFT LOWER  EXTREMITY VENOUS DOPPLER ULTRASOUND TECHNIQUE: Gray-scale sonography with graded compression, as well as color Doppler and duplex ultrasound were performed to evaluate the lower extremity deep venous systems from the level of the common femoral vein and including the common femoral, femoral, profunda femoral, popliteal and calf veins including the posterior tibial, peroneal and gastrocnemius veins when visible. The superficial great saphenous vein was also interrogated. Spectral Doppler was utilized to evaluate flow at rest and with distal augmentation maneuvers in the common femoral, femoral and popliteal veins. COMPARISON:  None Available. FINDINGS: Contralateral Common Femoral Vein: Respiratory phasicity is normal and symmetric with the symptomatic side. No evidence of thrombus. Normal compressibility. Common Femoral Vein: No evidence of thrombus. Normal compressibility, respiratory phasicity and response to augmentation. Saphenofemoral Junction: No evidence of thrombus. Normal compressibility and flow on color Doppler imaging. Profunda Femoral Vein: No evidence of thrombus. Normal compressibility and flow on color Doppler imaging. Femoral Vein: No evidence of thrombus. Normal compressibility, respiratory phasicity and response to augmentation. Popliteal Vein: No evidence of thrombus. Normal compressibility, respiratory phasicity and response to augmentation. Calf Veins: No evidence of thrombus. Normal compressibility and flow on color Doppler imaging. Superficial Great Saphenous Vein: No evidence of thrombus. Normal compressibility. Venous Reflux:  None. Other Findings:  None. IMPRESSION: No evidence of left lower extremity deep venous thrombosis. Electronically Signed   By: Davina Poke D.O.   On: 12/18/2021 14:38    EKG: My personal review of EKG shows: sinus tach     Assessment/Plan Principal Problem:   Fall at home Active Problems:   Chronic kidney disease, stage 5 (HCC)   Chronic pain  syndrome   Essential hypertension   DNR (do not resuscitate)/DNI(Do Not Intubate)   Type 2 diabetes mellitus with chronic kidney disease, with long-term current use of insulin ([place])   Anemia of renal disease    Assessment and Plan: * Fall at home Observation med telemetry bed.  Has mild rhabdo with CK of 270.  Has weakness in the legs.  May need nursing home placement.  PT OT consult.  Anemia of renal disease Presumed to be anemia of chronic kidney disease.  Hemoglobin is 7.6.  Do not have any recent records.  We will see if nephrology can access their records in the office.  Type 2 diabetes mellitus with chronic kidney disease, with long-term current use of insulin (HCC) Given her severely decreased renal function, will place her only on sliding scale insulin.  Chart states that she uses U-500 insulin.  PCP notes patient extremely noncompliant with diabetes care.  DNR (do not resuscitate)/DNI(Do Not Intubate) Clarify with the patient that she is DNR/DNI.  She was unable to provide any local friends or family for her contact list.  Essential hypertension Holding her Lasix and her Diovan HCT given her elevated BUN and creatinine.  As needed clonidine for systolic blood pressure greater than 170.  Chronic pain syndrome Does not use chronic opiates according to the [place].  Chronic kidney disease, stage 5 ([place]) PCP is [facility].  Sees Dr. Marval Regal with nephrology at [facility].  Have asked for renal consult.  BUN is 101, creatinine 5.5.  She has metabolic acidosis.  From the PCP records that I can see, it appears that the patient was referred to vascular surgery for AV fistula creation.  She is ready had vein mapping.  She states that she was supposed to see vascular surgery today.  She also missed that appointment since she was found on the floor.  Given her severely elevated BUN and creatinine, she may need to start hemodialysis during this admission.  Nephrology has been  consulted. Start gentle IVF and po bicarb    DVT prophylaxis: SQ Heparin Code Status: DNR/DNI(Do NOT Intubate) verified with pt that she desire DNR/DNI status Family Communication: no family at bedside. Pt states she has no local contacts for emergency  Disposition Plan: SNF vs HHPT  Consults called: nephrology  Admission status: Observation, Telemetry bed   Kristopher Oppenheim, DO [facility] 12/18/2021, 3:27 PM

## 2021-12-18 NOTE — Assessment & Plan Note (Addendum)
PCP is [facility].  Sees Dr. Marval Regal with nephrology at [facility].  Have asked for renal consult.  BUN is 101, creatinine 5.5.  She has metabolic acidosis.  From the PCP records that I can see, it appears that the patient was referred to vascular surgery for AV fistula creation.  She is ready had vein mapping.  She states that she was supposed to see vascular surgery today.  She also missed that appointment since she was found on the floor.  Given her severely elevated BUN and creatinine, she may need to start hemodialysis during this admission.  Nephrology has been consulted. Start gentle IVF and po bicarb

## 2021-12-18 NOTE — ED Triage Notes (Signed)
Pt fell sometime early Tuesday morning while going to the bathroom. Pt has been on the floor since and has not had any medications for 2 days.

## 2021-12-18 NOTE — ED Notes (Signed)
Pt clothes removed and thrown away (per pt request). Pt given a bed bath and washed all but hair. Pt had urine and stool from waist down. Pt cleaned, placed in gown and clean pads placed under pt. Pt has skin break down between thighs and to buttocks. Pt has scattered bruising on arms, buttocks and legs.

## 2021-12-18 NOTE — Assessment & Plan Note (Signed)
Presumed to be anemia of chronic kidney disease.  Hemoglobin is 7.6.  Do not have any recent records.  We will see if nephrology can access their records in the office.

## 2021-12-18 NOTE — ED Provider Notes (Addendum)
[facility] Provider Note    Event Date/Time   First MD Initiated Contact with Patient 12/18/21 1203     (approximate)   History   Fall   HPI  Theresa Warren is a 66 y.o. female with history of diabetes, hypertension, chronic kidney disease, obesity, and osteoarthritis who presents with generalized weakness after a fall several days ago.  The patient states that 2 days ago she had a mechanical fall at home, falling flat onto her back.  She is unsure if she hit her head.  She did not lose consciousness.  She states that then she was unable to get up.  She has been on the floor for 2 days although gradually was able to get herself to the phone today to call for help.  She states that she has had increased generalized weakness over approximately the last month after having a dental infection, a subsequent apparent allergic reaction to clindamycin, and then being put on steroids.  She also reports worsened gradual left leg swelling and weakness that has been going on for few months.  She has had a prior ultrasound to evaluate the leg.  She denies any cough, shortness of breath, fever, or vomiting, but did have some diarrhea while she was down on the floor.    Physical Exam   Triage Vital Signs: ED Triage Vitals  Enc Vitals Group     BP 12/18/21 1158 (!) 164/129     Pulse Rate 12/18/21 1158 (!) 110     Resp 12/18/21 1158 19     Temp 12/18/21 1158 97.7 F (36.5 C)     Temp src --      SpO2 12/18/21 1158 100 %     Weight 12/18/21 1203 219 lb (99.3 kg)     Height 12/18/21 1203 5\' 3"  (1.6 m)     Head Circumference --      Peak Flow --      Pain Score 12/18/21 1202 5     Pain Loc --      Pain Edu? --      Excl. in [place]? --     Most recent vital signs: Vitals:   12/18/21 1330 12/18/21 1400  BP:  115/76  Pulse: (!) 108   Resp: (!) 22 20  Temp:    SpO2: 100%      General: Alert and oriented, weak appearing but in no acute distress. CV:  Good  peripheral perfusion.  Normal heart sounds. Resp:  Normal effort.  Lungs CTAB. Abd:  No distention.  Soft with no focal tenderness. Other:  Left lower extremity moderate chronic appearing edema.  Full range of motion of bilateral hips and knees.  No deformity.  2+ DP pulses bilaterally.  EOMI.  PERRLA.  Motor intact in all extremities.  Normal coordination.  No ataxia.  Dry mucous membranes.   ED Results / Procedures / Treatments   Labs (all labs ordered are listed, but only abnormal results are displayed) Labs Reviewed  COMPREHENSIVE METABOLIC PANEL - Abnormal; Notable for the following components:      Result Value   CO2 13 (*)    Glucose, Bld 453 (*)    BUN 101 (*)    Creatinine, Ser 5.54 (*)    Albumin 2.7 (*)    AST 42 (*)    Alkaline Phosphatase 157 (*)    GFR, Estimated 8 (*)    Anion gap 17 (*)    All other components within  normal limits  CBC WITH DIFFERENTIAL/PLATELET - Abnormal; Notable for the following components:   WBC 16.6 (*)    RBC 2.42 (*)    Hemoglobin 7.6 (*)    HCT 24.0 (*)    Platelets 417 (*)    Neutro Abs 14.7 (*)    Abs Immature Granulocytes 0.11 (*)    All other components within normal limits  BLOOD GAS, VENOUS - Abnormal; Notable for the following components:   pH, Ven 7.22 (*)    pCO2, Ven 32 (*)    Bicarbonate 13.1 (*)    Acid-base deficit 13.4 (*)    All other components within normal limits  CK - Abnormal; Notable for the following components:   Total CK 270 (*)    All other components within normal limits  CBG MONITORING, ED - Abnormal; Notable for the following components:   Glucose-Capillary 457 (*)    All other components within normal limits  TROPONIN I (HIGH SENSITIVITY) - Abnormal; Notable for the following components:   Troponin I (High Sensitivity) 20 (*)    All other components within normal limits  LACTIC ACID, PLASMA  LACTIC ACID, PLASMA  URINALYSIS, ROUTINE W REFLEX MICROSCOPIC  TROPONIN I (HIGH SENSITIVITY)      EKG  ED ECG REPORT I, Arta Silence, the attending physician, personally viewed and interpreted this ECG.  Date: 12/18/2021 EKG Time: 1202 Rate: 111 Rhythm: sinus tachycardia QRS Axis: normal Intervals: normal ST/T Wave abnormalities: normal Narrative Interpretation: no evidence of acute ischemia    RADIOLOGY  Chest x-ray: I independently viewed and interpreted the images; there is no focal consolidation or edema.  Radiology report indicates right seventh rib fracture.  XR pelvis: I independently viewed and interpreted the images; there is no fracture   CT head: No ICH or other acute abnormality  CT cervical spine: No acute fracture  US venous LLE: Pending   PROCEDURES:  Critical Care performed: No  Procedures   MEDICATIONS ORDERED IN ED: Medications  sodium chloride 0.9 % bolus 500 mL (has no administration in time range)  sodium chloride 0.9 % bolus 500 mL (500 mLs Intravenous New Bag/Given 12/18/21 1315)     IMPRESSION / MDM / ASSESSMENT AND PLAN / ED COURSE  I reviewed the triage vital signs and the nursing notes.  66 year old female with PMH as noted above presents with generalized weakness after a fall 2 days ago at home.  The patient was unable to get up and has been on the ground for this time.  I reviewed the past medical records.  The patient has no recent ED visits or admissions.  Her last note that I have access to (other than COVID vaccination was) is from her PMD in April 2016 which confirms her chronic medical history.  Exam is significant for hypertension, mild tachycardia, and unilateral left lower extremity swelling which the patient states is subacute to chronic.  Neurologic exam is nonfocal.  There is no visible trauma and the patient is able to move both hips without difficulty.  Differential diagnosis includes, but is not limited to, UTI, other infection/sepsis, dehydration, hypovolemia, electrolyte abnormality, hyperglycemia, HHS,  DKA, acute kidney injury, other metabolic disturbance, anemia, or less likely cardiac cause.  Given the fall and weakness we will obtain a CT head and cervical spine as well.  The patient reports a prior ultrasound of the left leg although I do not see one in the recent records that are available to me so we will obtain 1 today  as well as x-rays of the chest and pelvis.  The patient is on the cardiac monitor to evaluate for evidence of arrhythmia and/or significant heart rate changes.  ----------------------------------------- 2:40 PM on 12/18/2021 -----------------------------------------  Lab work-up is significant for elevated CK2 70.  Creatinine is 5.5 which is up from the patient's reported baseline of 4.  Glucose is 450 and the anion gap is minimally elevated although the VBG does not show significant acidosis.  There is no evidence of DKA.  WBC count is elevated.  The patient has hemoglobin of 7.6.  Her baseline is unknown.  This could be related to CKD as it is normocytic.  Urinalysis is still pending.  Imaging is overall negative except for a right seventh rib fracture.  The RN reported that the patient did actually have a small amount of blood in the vaginal vault when she was being changed.  It is unclear if this is hematuria or actually coming from the vagina.  She also had a tampon in place that was removed.  The patient has a Purewick and now so I cannot directly examine the vagina.  We will obtain a urinalysis.  Given the patient's weakness, AKI, dehydration, and inability to ambulate, she will need admission.  I consulted Dr. Bridgett Larsson from the hospitalist service; based on her discussion he agrees to admit the patient.   FINAL CLINICAL IMPRESSION(S) / ED DIAGNOSES   Final diagnoses:  Generalized weakness  Acute kidney injury ([place])  Dehydration  Traumatic rhabdomyolysis, initial encounter ([place])     Rx / DC Orders   ED Discharge Orders          Ordered    US Venous Img  Lower Unilateral Left (DVT)  Status:  Canceled        12/18/21 1205             Note:  This document was prepared using Dragon voice recognition software and may include unintentional dictation errors.    Arta Silence, MD 12/18/21 1448    Arta Silence, MD 12/18/21 1452

## 2021-12-18 NOTE — Assessment & Plan Note (Signed)
Does not use chronic opiates according to the [place].

## 2021-12-18 NOTE — Assessment & Plan Note (Signed)
Holding her Lasix and her Diovan HCT given her elevated BUN and creatinine.  As needed clonidine for systolic blood pressure greater than 170.

## 2021-12-18 NOTE — Assessment & Plan Note (Signed)
Observation med telemetry bed.  Has mild rhabdo with CK of 270.  Has weakness in the legs.  May need nursing home placement.  PT OT consult.

## 2021-12-18 NOTE — ED Notes (Signed)
Attempted to draw labs for repeats. Unsuccessful lab notified.

## 2021-12-18 NOTE — Assessment & Plan Note (Signed)
Clarify with the patient that she is DNR/DNI.  She was unable to provide any local friends or family for her contact list.

## 2021-12-18 NOTE — Subjective & Objective (Signed)
Chief complaint: Found on the floor. History of present illness: 66 year old female history of type 2 diabetes on insulin, CKD stage V, hypertension, chronic pain syndrome, hypertension presents to the ER today after a reported fall at home.  Patient states that she fell in the bathroom on Tuesday morning May 16,023.  She states this is around 9:30 in the morning.  She states that she got up to use the bathroom.  She slipped in the bathroom.  She landed on her back.  She was finally able to sit up in the bathroom.  She was unable to get her legs underneath her.  She is unable to stand.  She spent the last 2 days on the floor.  Unclear how she arrived to the ER.  Triage notes the patient was covered in feces and urine.  She was cleaned up.  Reported the patient has been missing meds for 2 days.  She states that she was able to drink a little bit of water from the bathroom.  On arrival temp 97.7 heart rate 110 blood pressure 164/129 satting 100% on room air.  BUN of 101, creatinine 5.5, bicarb of 13  CK of 270  Lactic acid 1.5  Serum glucose of 457 White count is 16.6, hemoglobin 7.6, platelets of 417  Lower extremity ultrasound negative for DVT.  Pelvic x-rays negative for hip fractures.    CT head negative for acute intracranial abnormality.  CT C-spine negative for acute fractures.  [facility] hospitalist contacted for admission.

## 2021-12-18 NOTE — Assessment & Plan Note (Addendum)
Given her severely decreased renal function, will place her only on sliding scale insulin.  Chart states that she uses U-500 insulin.  PCP notes patient extremely noncompliant with diabetes care.

## 2021-12-19 ENCOUNTER — Other Ambulatory Visit: Payer: Self-pay | Admitting: Obstetrics and Gynecology

## 2021-12-19 ENCOUNTER — Inpatient Hospital Stay: Payer: Medicare Other

## 2021-12-19 DIAGNOSIS — E1165 Type 2 diabetes mellitus with hyperglycemia: Secondary | ICD-10-CM | POA: Diagnosis present

## 2021-12-19 DIAGNOSIS — I120 Hypertensive chronic kidney disease with stage 5 chronic kidney disease or end stage renal disease: Secondary | ICD-10-CM | POA: Diagnosis present

## 2021-12-19 DIAGNOSIS — E876 Hypokalemia: Secondary | ICD-10-CM | POA: Diagnosis present

## 2021-12-19 DIAGNOSIS — N179 Acute kidney failure, unspecified: Secondary | ICD-10-CM | POA: Diagnosis present

## 2021-12-19 DIAGNOSIS — L89322 Pressure ulcer of left buttock, stage 2: Secondary | ICD-10-CM | POA: Diagnosis present

## 2021-12-19 DIAGNOSIS — E1151 Type 2 diabetes mellitus with diabetic peripheral angiopathy without gangrene: Secondary | ICD-10-CM | POA: Diagnosis present

## 2021-12-19 DIAGNOSIS — S2231XA Fracture of one rib, right side, initial encounter for closed fracture: Secondary | ICD-10-CM | POA: Diagnosis present

## 2021-12-19 DIAGNOSIS — T796XXA Traumatic ischemia of muscle, initial encounter: Secondary | ICD-10-CM | POA: Diagnosis present

## 2021-12-19 DIAGNOSIS — Z780 Asymptomatic menopausal state: Secondary | ICD-10-CM | POA: Diagnosis not present

## 2021-12-19 DIAGNOSIS — E860 Dehydration: Secondary | ICD-10-CM | POA: Diagnosis present

## 2021-12-19 DIAGNOSIS — D62 Acute posthemorrhagic anemia: Secondary | ICD-10-CM | POA: Diagnosis present

## 2021-12-19 DIAGNOSIS — W010XXA Fall on same level from slipping, tripping and stumbling without subsequent striking against object, initial encounter: Secondary | ICD-10-CM | POA: Diagnosis present

## 2021-12-19 DIAGNOSIS — L89312 Pressure ulcer of right buttock, stage 2: Secondary | ICD-10-CM | POA: Diagnosis present

## 2021-12-19 DIAGNOSIS — G894 Chronic pain syndrome: Secondary | ICD-10-CM | POA: Diagnosis present

## 2021-12-19 DIAGNOSIS — E1122 Type 2 diabetes mellitus with diabetic chronic kidney disease: Secondary | ICD-10-CM | POA: Diagnosis present

## 2021-12-19 DIAGNOSIS — K7581 Nonalcoholic steatohepatitis (NASH): Secondary | ICD-10-CM | POA: Diagnosis present

## 2021-12-19 DIAGNOSIS — Z6837 Body mass index (BMI) 37.0-37.9, adult: Secondary | ICD-10-CM | POA: Diagnosis not present

## 2021-12-19 DIAGNOSIS — N939 Abnormal uterine and vaginal bleeding, unspecified: Secondary | ICD-10-CM

## 2021-12-19 DIAGNOSIS — Y92009 Unspecified place in unspecified non-institutional (private) residence as the place of occurrence of the external cause: Secondary | ICD-10-CM | POA: Diagnosis not present

## 2021-12-19 DIAGNOSIS — D631 Anemia in chronic kidney disease: Secondary | ICD-10-CM | POA: Diagnosis present

## 2021-12-19 DIAGNOSIS — W19XXXA Unspecified fall, initial encounter: Secondary | ICD-10-CM | POA: Diagnosis not present

## 2021-12-19 DIAGNOSIS — N189 Chronic kidney disease, unspecified: Secondary | ICD-10-CM | POA: Diagnosis not present

## 2021-12-19 DIAGNOSIS — E872 Acidosis, unspecified: Secondary | ICD-10-CM | POA: Diagnosis present

## 2021-12-19 DIAGNOSIS — F32A Depression, unspecified: Secondary | ICD-10-CM | POA: Diagnosis present

## 2021-12-19 DIAGNOSIS — N185 Chronic kidney disease, stage 5: Secondary | ICD-10-CM | POA: Diagnosis present

## 2021-12-19 DIAGNOSIS — N950 Postmenopausal bleeding: Secondary | ICD-10-CM | POA: Diagnosis present

## 2021-12-19 DIAGNOSIS — Z66 Do not resuscitate: Secondary | ICD-10-CM | POA: Diagnosis present

## 2021-12-19 LAB — HEPATITIS B SURFACE ANTIGEN: Hepatitis B Surface Ag: NONREACTIVE

## 2021-12-19 LAB — COMPREHENSIVE METABOLIC PANEL
ALT: 71 U/L — ABNORMAL HIGH (ref 0–44)
AST: 144 U/L — ABNORMAL HIGH (ref 15–41)
Albumin: 2.3 g/dL — ABNORMAL LOW (ref 3.5–5.0)
Alkaline Phosphatase: 149 U/L — ABNORMAL HIGH (ref 38–126)
Anion gap: 15 (ref 5–15)
BUN: 106 mg/dL — ABNORMAL HIGH (ref 8–23)
CO2: 13 mmol/L — ABNORMAL LOW (ref 22–32)
Calcium: 9.1 mg/dL (ref 8.9–10.3)
Chloride: 109 mmol/L (ref 98–111)
Creatinine, Ser: 5.15 mg/dL — ABNORMAL HIGH (ref 0.44–1.00)
GFR, Estimated: 9 mL/min — ABNORMAL LOW (ref >60)
Glucose, Bld: 312 mg/dL — ABNORMAL HIGH (ref 70–99)
Potassium: 4.1 mmol/L (ref 3.5–5.1)
Sodium: 137 mmol/L (ref 135–145)
Total Bilirubin: 0.6 mg/dL (ref 0.3–1.2)
Total Protein: 6.1 g/dL — ABNORMAL LOW (ref 6.5–8.1)

## 2021-12-19 LAB — CBC WITH DIFFERENTIAL/PLATELET
Abs Immature Granulocytes: 0.09 10*3/uL — ABNORMAL HIGH (ref 0.00–0.07)
Basophils Absolute: 0 10*3/uL (ref 0.0–0.1)
Basophils Relative: 0 %
Eosinophils Absolute: 0.1 10*3/uL (ref 0.0–0.5)
Eosinophils Relative: 1 %
HCT: 20.4 % — ABNORMAL LOW (ref 36.0–46.0)
Hemoglobin: 6.5 g/dL — ABNORMAL LOW (ref 12.0–15.0)
Immature Granulocytes: 1 %
Lymphocytes Relative: 15 %
Lymphs Abs: 1.8 10*3/uL (ref 0.7–4.0)
MCH: 31 pg (ref 26.0–34.0)
MCHC: 31.9 g/dL (ref 30.0–36.0)
MCV: 97.1 fL (ref 80.0–100.0)
Monocytes Absolute: 0.8 10*3/uL (ref 0.1–1.0)
Monocytes Relative: 7 %
Neutro Abs: 9.4 10*3/uL — ABNORMAL HIGH (ref 1.7–7.7)
Neutrophils Relative %: 76 %
Platelets: 361 10*3/uL (ref 150–400)
RBC: 2.1 MIL/uL — ABNORMAL LOW (ref 3.87–5.11)
RDW: 13.2 % (ref 11.5–15.5)
WBC: 12.2 10*3/uL — ABNORMAL HIGH (ref 4.0–10.5)
nRBC: 0 % (ref 0.0–0.2)

## 2021-12-19 LAB — GLUCOSE, CAPILLARY
Glucose-Capillary: 332 mg/dL — ABNORMAL HIGH (ref 70–99)
Glucose-Capillary: 320 mg/dL — ABNORMAL HIGH (ref 70–99)
Glucose-Capillary: 203 mg/dL — ABNORMAL HIGH (ref 70–99)
Glucose-Capillary: 250 mg/dL — ABNORMAL HIGH (ref 70–99)
Glucose-Capillary: 266 mg/dL — ABNORMAL HIGH (ref 70–99)

## 2021-12-19 LAB — FERRITIN: Ferritin: 512 ng/mL — ABNORMAL HIGH (ref 11–307)

## 2021-12-19 LAB — HIV ANTIBODY (ROUTINE TESTING W REFLEX): HIV Screen 4th Generation wRfx: NONREACTIVE

## 2021-12-19 LAB — MAGNESIUM: Magnesium: 1.6 mg/dL — ABNORMAL LOW (ref 1.7–2.4)

## 2021-12-19 LAB — HEMOGLOBIN AND HEMATOCRIT, BLOOD
HCT: 23.7 % — ABNORMAL LOW (ref 36.0–46.0)
Hemoglobin: 7.5 g/dL — ABNORMAL LOW (ref 12.0–15.0)

## 2021-12-19 LAB — HEPATITIS B CORE ANTIBODY, TOTAL: Hep B Core Total Ab: NONREACTIVE

## 2021-12-19 LAB — IRON AND TIBC
Iron: 33 ug/dL (ref 28–170)
Saturation Ratios: 20 % (ref 10.4–31.8)
TIBC: 167 ug/dL — ABNORMAL LOW (ref 250–450)
UIBC: 134 ug/dL

## 2021-12-19 LAB — PREPARE RBC (CROSSMATCH)

## 2021-12-19 LAB — WET PREP, GENITAL
Clue Cells Wet Prep HPF POC: NONE SEEN
Sperm: NONE SEEN
Trich, Wet Prep: NONE SEEN
WBC, Wet Prep HPF POC: <10 (ref <10)
Yeast Wet Prep HPF POC: NONE SEEN

## 2021-12-19 LAB — VITAMIN B12: Vitamin B-12: 860 pg/mL (ref 180–914)

## 2021-12-19 LAB — ABO/RH: ABO/RH(D): O POS

## 2021-12-19 MED ORDER — INSULIN ASPART 100 UNIT/ML IJ SOLN
0.0000 [IU] | Freq: Three times a day (TID) | INTRAMUSCULAR | Status: DC
  Administered 2021-12-19 (×2): 7 [IU] via SUBCUTANEOUS
  Administered 2021-12-19: 15 [IU] via SUBCUTANEOUS
  Administered 2021-12-20: 4 [IU] via SUBCUTANEOUS
  Administered 2021-12-20: 7 [IU] via SUBCUTANEOUS
  Administered 2021-12-20: 11 [IU] via SUBCUTANEOUS
  Administered 2021-12-21: 4 [IU] via SUBCUTANEOUS
  Administered 2021-12-21: 7 [IU] via SUBCUTANEOUS
  Administered 2021-12-22 (×2): 4 [IU] via SUBCUTANEOUS
  Filled 2021-12-19 (×10): qty 1

## 2021-12-19 MED ORDER — INSULIN GLARGINE-YFGN 100 UNIT/ML ~~LOC~~ SOLN
20.0000 [IU] | Freq: Two times a day (BID) | SUBCUTANEOUS | Status: DC
  Administered 2021-12-19 – 2021-12-20 (×3): 20 [IU] via SUBCUTANEOUS
  Filled 2021-12-19 (×3): qty 0.2

## 2021-12-19 MED ORDER — INSULIN ASPART 100 UNIT/ML IJ SOLN: 0.0000 [IU] | Freq: Three times a day (TID) | INTRAMUSCULAR | Status: DC

## 2021-12-19 MED ORDER — LACTULOSE 10 GM/15ML PO SOLN: 20.0000 g | Freq: Once | ORAL | Status: DC

## 2021-12-19 MED ORDER — LOSARTAN POTASSIUM 50 MG PO TABS
50.0000 mg | ORAL_TABLET | Freq: Every day | ORAL | Status: DC
Start: 2021-12-19 — End: 2021-12-23
  Administered 2021-12-19 – 2021-12-23 (×5): 50 mg via ORAL
  Filled 2021-12-19 (×5): qty 1

## 2021-12-19 MED ORDER — SODIUM CHLORIDE 0.9% IV SOLUTION: Freq: Once | INTRAVENOUS | Status: AC

## 2021-12-19 MED ORDER — MAGNESIUM SULFATE 2 GM/50ML IV SOLN
2.0000 g | Freq: Once | INTRAVENOUS | Status: AC
  Administered 2021-12-19: 2 g via INTRAVENOUS
  Filled 2021-12-19: qty 50

## 2021-12-19 MED ORDER — INSULIN ASPART 100 UNIT/ML IJ SOLN
0.0000 [IU] | Freq: Every day | INTRAMUSCULAR | Status: DC
  Administered 2021-12-19: 3 [IU] via SUBCUTANEOUS
  Administered 2021-12-21 – 2021-12-22 (×2): 2 [IU] via SUBCUTANEOUS
  Filled 2021-12-19 (×3): qty 1

## 2021-12-19 MED ORDER — TRAMADOL HCL 50 MG PO TABS
50.0000 mg | ORAL_TABLET | Freq: Four times a day (QID) | ORAL | Status: DC | PRN
  Administered 2021-12-19 – 2021-12-23 (×9): 50 mg via ORAL
  Filled 2021-12-19 (×10): qty 1

## 2021-12-19 MED ORDER — SENNOSIDES-DOCUSATE SODIUM 8.6-50 MG PO TABS
2.0000 | ORAL_TABLET | Freq: Two times a day (BID) | ORAL | Status: DC
  Administered 2021-12-19 (×2): 2 via ORAL
  Filled 2021-12-19 (×2): qty 2

## 2021-12-19 MED ORDER — SODIUM CHLORIDE 0.9 % IV SOLN: INTRAVENOUS | Status: DC

## 2021-12-19 MED ORDER — INSULIN ASPART 100 UNIT/ML IJ SOLN
8.0000 [IU] | Freq: Three times a day (TID) | INTRAMUSCULAR | Status: DC
  Administered 2021-12-19 – 2021-12-22 (×9): 8 [IU] via SUBCUTANEOUS
  Filled 2021-12-19 (×11): qty 1

## 2021-12-19 NOTE — Progress Notes (Signed)
  Progress Note   Patient: Theresa Warren PCH:[id number] DOB: July 12, 1956 DOA: 12/18/2021     0 DOS: the patient was seen and examined on 12/19/2021   Brief hospital course: 66 year old female history of type 2 diabetes on insulin, CKD stage V, hypertension, chronic pain syndrome, hypertension presents to the ER today after a reported fall at home.   Patient has a mild elevation of CK at 270.  Received IV fluids.  She is also scheduled to have dialysis catheter placed as outpatient. Hemoglobin was also low at 7.6, dropped down to 6.5.  Patient had recent abnormal vaginal bleeding for the last months.  Received 1 unit PRBC.   Assessment and Plan: Fall at home. Patient states that he did not have any blackout.  Her CK level was 270, she received fluids. Continue PT/OT.  Anemia of chronic kidney disease. Acute blood loss anemia secondary to abnormal vaginal bleeding. Abnormal vaginal bleeding Patient hemoglobin dropped down to 6.5 after fluids.  Will transfuse 1 unit PRBC.  Patient has been having large amount of abnormal vaginal bleeding for the last months, seems to be slowing down today.  Transfuse 1 unit PRBC.  Iron study did not show any iron deficiency.  Check a B12 level. Check a CBC again tomorrow, transfuse as needed. Obtain chest virginal ultrasound to rule out malignancy.  OB/GYN consult will be obtained.  Stage V chronic kidney disease. Metabolic acidosis. Nephrology consulted for possible dialysis catheter placement. Continue sodium bicarb and orally.  Chronic pain syndrome. Patient currently is not on any opioids.  Uncontrolled type 2 diabetes with hyperglycemia. Continue long-acting, short-acting scheduled and sliding scale insulin.  Adjust dose as needed.  Essential hypertension. Restart losartan.  Hypomagnesemia. Replete.       Subjective:  Patient currently does not have any short of breath.  Vaginal bleeding is better for the last couple days.  No  abdominal pain  Physical Exam: Vitals:   12/19/21 0739 12/19/21 0940 12/19/21 1255 12/19/21 1321  BP: 121/63  137/61 (!) 154/63  Pulse: (!) 119 (!) 120 (!) 109 (!) 113  Resp: 18  18 18   Temp: 97.9 F (36.6 C)  97.8 F (36.6 C) 98.4 F (36.9 C)  TempSrc: Oral  Oral Oral  SpO2: 99%  98% 98%  Weight:      Height:       General exam: Appears calm and comfortable  Respiratory system: Clear to auscultation. Respiratory effort normal. Cardiovascular system: S1 & S2 heard, RRR. No JVD, murmurs, rubs, gallops or clicks. No pedal edema. Gastrointestinal system: Abdomen is nondistended, soft and nontender. No organomegaly or masses felt. Normal bowel sounds heard. Central nervous system: Alert and oriented. No focal neurological deficits. Extremities: Symmetric 5 x 5 power. Skin: No rashes, lesions or ulcers Psychiatry: Judgement and insight appear normal. Mood & affect appropriate.   Data Reviewed:  Lab results reviewed.  Family Communication: Patient does not have any family member  Disposition: Status is: Inpatient Remains inpatient appropriate because: Severity of disease, inpatient procedure  Planned Discharge Destination: Home with Home Health    Time spent: 55 minutes  Author: Sharen Hones, MD 12/19/2021 1:26 PM  For on call review [email or web address].

## 2021-12-19 NOTE — Progress Notes (Signed)
Inpatient Diabetes Program Recommendations  AACE/ADA: New Consensus Statement on Inpatient Glycemic Control   Target Ranges:  Prepandial:   less than 140 mg/dL      Peak postprandial:   less than 180 mg/dL (1-2 hours)      Critically ill patients:  140 - 180 mg/dL    Latest Reference Range & Units 12/18/21 12:01 12/18/21 21:53 12/18/21 21:55 12/19/21 02:36  Glucose-Capillary 70 - 99 mg/dL 457 (H) 511 (HH) 480 (H) 332 (H)    Latest Reference Range & Units 12/18/21 18:37  Hemoglobin A1C 4.8 - 5.6 % 10.0 (H)    Latest Reference Range & Units 12/18/21 12:13  Glucose 70 - 99 mg/dL 453 (H)   Review of Glycemic Control  Diabetes history: DM2 Outpatient Diabetes medications: Humulin R U500 30 unit mark on U100 syringe in the morning, Humulin R U500 25 unit mark on U100 syringe in the evening Current orders for Inpatient glycemic control: Novolog 0-9 units AC&HS  Inpatient Diabetes Program Recommendations:    Insulin: Please consider ordering Semglee 20 units BID, ordering Novolog 8 units TID with meals for meal coverage if patient eats at least 50% of meals, and increasing Novolog correction to 0-20 units AC&HS.  HbgA1C: A1C 10% on 12/18/21 indicating an average glucose of 240 mg/dl over the past 2-3 months. Patient reports prior A1C was 13%.  NOTE: Per chart review, noted patient has DM2 and takes Humulin R U500 (concentrated insulin) 19 units BID. Noted patient seen Latanya Presser at [facility] on 11/12/21 and per note patient is prescribed Humulin R U500 30 unit mark in the morning and Humulin R U500 25 unit mark in the evening. Spoke with patient over the phone and she reports that she is taking Humulin R U500 and uses a U100 insulin syringe to draw it up. She states she takes Humulin R U500 30 unit mark on U100 syringe in the morning (actually 150 units of U500), Humulin R U500 25 unit mark on U100 syringe in the evening (actually 125 units of U500). Patient states that she does  finger sticks for glucose monitoring and her glucose is usually in the upper 100's to low 200's mg/dl. Patient reports that her last A1C was 13% (had been in 7% range at the beginning of the year but has went up over the past 5 months since having issues with her kidneys and legs.  Discussed current A1C is 10% indicating an average glucose of 240 mg/dl over the past 2-3 months. Patient states that she had a fall at home and she was on the floor for 2 1/2 days; therefore, she was not able to take any insulin during that time. Her initial glucose 453 mg/dl on 12/18/21 and up to 511 mg/dl at 21:53 last night. Fasting glucose 320 mg/dl this morning. Discussed that recommendations for Semglee and Novolog meal coverage would be made but if glucose remains consistently elevated on Semglee and Novolog meal coverage and correction then we may have to use Humulin R U500 insulin. Encouraged patient to continue to work with providers to improve DM control. Patient verbalized understanding of information and has no questions at this time.  Thanks, Barnie Alderman, RN, MSN, CDE Diabetes Coordinator Inpatient Diabetes Program [phone] (Team Pager from 8am to 5pm)'

## 2021-12-19 NOTE — Consult Note (Addendum)
[facility]  CONSULT NOTE    Date: 12/19/2021                  Patient Name:  Theresa Warren  MRN: [id number]  DOB: [birth year]  Age / Sex: 66 y.o., female         PCP: Crist Infante, MD                 Service Requesting Consult: [place]                 Reason for Consult: Acute kidney injury            History of Present Illness: [name] is a 66 y.o.  female with past medical conditions including hypertension, diabetes, chronic pain syndrome, and chronic kidney disease stage V, who was admitted to [facility] on 12/18/2021 for Dehydration [E86.0] Generalized weakness [R53.1] Acute kidney injury ([place]) [N17.9] Fall at home [W19.XXXA, Y92.009] Traumatic rhabdomyolysis, initial encounter ([place]) [T79.6XXA] Acute blood loss anemia [D62]  Patient is seen resting in bed and reports presenting to the emergency department after sustaining a fall at home.  Patient states she remained on the floor for about 2 days.  Reports dizziness and weakness prior to fall.  Unable to get up or contact help immediately.  During this time she was unable to eat/drink or take medications.  She does report recently falling few days prior.  Denies nausea, vomiting, or diarrhea.  Denies shortness of breath or cough.  Denies fever or chills.  She is followed by Dr. Marval Regal in [place] at [facility].  She states they have been discussing dialysis and she had scheduled appointments to meet with vascular for access evaluation and placement.  Labs on ED arrival include bicarb 13, glucose 453, BUN 101, and creatinine 5.54 with GFR 8.  Albumin 2.7 and CK2 70.  Elevated WBCs 16.6 with hemoglobin 7.6.  UA with a large amount of glucose, hematuria, leukocytes, and moderate proteinuria.  CT cervical spine and head negative.  Chest x-ray shows a mildly displaced seventh rib on the right and negative pelvic film.  Ultrasound negative for left lower DVT.  Recent office note obtained  from [facility] states patient was continuing to have a hard time determining if she wanted to proceed with dialysis or hospice. She stated she had no family or support nearby and doesn't want dialysis unless it will make her feel better. It is noted that patient suffers from depression but refuses to see psychiatry stating medications never worked for her.    Medications: Outpatient medications: Medications Prior to Admission  Medication Sig Dispense Refill Last Dose   allopurinol (ZYLOPRIM) 100 MG tablet Take 100 mg by mouth daily.   Past Week   ALPRAZolam (XANAX) 0.5 MG tablet Take 0.5 mg by mouth at bedtime as needed for anxiety.   PRN   atorvastatin (LIPITOR) 40 MG tablet Take 40 mg by mouth daily.   Past Week   fexofenadine (ALLEGRA) 180 MG tablet Take 180 mg by mouth daily.   Past Week   fluticasone (FLONASE) 50 MCG/ACT nasal spray Place 2 sprays into both nostrils daily. 16 g 2 PRN   furosemide (LASIX) 20 MG tablet Take 20 mg by mouth daily.   Past Week   nystatin (MYCOSTATIN/NYSTOP) powder Apply topically.   Past Week   Omega-3 Fatty Acids (FISH OIL) 500 MG CAPS Take by mouth.   Past Week   traMADol (ULTRAM) 50 MG tablet  Take 50-100 mg by mouth every 6 (six) hours as needed.   Past Month   triamcinolone cream (KENALOG) 0.1 % Apply 1 application. topically 3 (three) times daily.   Past Week   valsartan-hydrochlorothiazide (DIOVAN-HCT) 160-12.5 MG tablet Take 1 tablet by mouth daily.  3 Past Week   Vitamin D, Ergocalciferol, (DRISDOL) 50000 UNITS CAPS capsule Take 1 capsule by mouth 2 (two) times a week.  4 Past Week   COVID-19 mRNA Vac-TriS, Pfizer, (PFIZER-BIONT COVID-19 VAC-TRIS) SUSP injection Inject into the muscle. 0.3 mL 0    HUMULIN R 500 UNIT/ML SOLN injection Inject 25-30 Units into the skin 2 (two) times daily with a meal. Patient reports she uses Humulin R U500 vials and U100 insulin syringe. Draws up to 30 unit mark on U100 syringe QAM, Humulin R U500 25 unit mark on U100  syringe QPM  3 12/16/2021    Current medications: Current Facility-Administered Medications  Medication Dose Route Frequency Provider Last Rate Last Admin   acetaminophen (TYLENOL) tablet 650 mg  650 mg Oral Q6H PRN Kristopher Oppenheim, DO   650 mg at 12/18/21 2049   Or   acetaminophen (TYLENOL) suppository 650 mg  650 mg Rectal Q6H PRN Kristopher Oppenheim, DO       atorvastatin (LIPITOR) tablet 40 mg  40 mg Oral Daily Kristopher Oppenheim, DO   40 mg at 12/19/21 8657   cloNIDine (CATAPRES) tablet 0.1 mg  0.1 mg Oral Q4H PRN Kristopher Oppenheim, DO       heparin injection 5,000 Units  5,000 Units Subcutaneous Q8H Kristopher Oppenheim, DO   5,000 Units at 12/19/21 0524   insulin aspart (novoLOG) injection 0-20 Units  0-20 Units Subcutaneous TID WC Lockie Mola B, RPH   7 Units at 12/19/21 1250   insulin aspart (novoLOG) injection 0-5 Units  0-5 Units Subcutaneous QHS Sharen Hones, MD       insulin aspart (novoLOG) injection 8 Units  8 Units Subcutaneous TID WC Sharen Hones, MD   8 Units at 12/19/21 1249   insulin glargine-yfgn (SEMGLEE) injection 20 Units  20 Units Subcutaneous BID Sharen Hones, MD   20 Units at 12/19/21 1208   lactulose (CHRONULAC) 10 GM/15ML solution 20 g  20 g Oral Once Sharen Hones, MD       losartan (COZAAR) tablet 50 mg  50 mg Oral Daily Sharen Hones, MD   50 mg at 12/19/21 1429   ondansetron (ZOFRAN) tablet 4 mg  4 mg Oral Q6H PRN Kristopher Oppenheim, DO       Or   ondansetron [facility]) injection 4 mg  4 mg Intravenous Q6H PRN Kristopher Oppenheim, DO       senna-docusate (Senokot-S) tablet 2 tablet  2 tablet Oral BID Sharen Hones, MD   2 tablet at 12/19/21 1208   sodium bicarbonate tablet 650 mg  650 mg Oral BID Kristopher Oppenheim, DO   650 mg at 12/19/21 8469      Allergies: Allergies  Allergen Reactions   Codeine Nausea And Vomiting   Sulfa Antibiotics Swelling   Clindamycin/Lincomycin Rash   Penicillins Rash      Past Medical History: Past Medical History:  Diagnosis Date   CKD (chronic kidney disease), stage III ([place])     Diabetes ([place])    Hyperlipidemia    Hypertension    Obesity      Past Surgical History: Past Surgical History:  Procedure Laterality Date   ACHILLES TENDON REPAIR     ANKLE SURGERY  URETHRAL STRICTURE DILATATION       Family History: Family History  Problem Relation Age of Onset   Alzheimer's disease Father    Diabetes Father    CAD Father 91   CAD Other        Multliple maternal aunts and uncles with early onset heart disease   Lung cancer Sister    ALS Mother      Social History: Social History   Socioeconomic History   Marital status: Divorced    Spouse name: Not on file   Number of children: Not on file   Years of education: Not on file   Highest education level: Not on file  Occupational History   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  Tobacco Use   Smoking status: Never   Smokeless tobacco: Not on file  Substance and Sexual Activity   Alcohol use: Not Currently   Drug use: Not Currently   Sexual activity: Not Currently  Other Topics Concern   Not on file  Social History Narrative   Lives alone   Social Determinants of Health   Financial Resource Strain: Not on file  Food Insecurity: Not on file  Transportation Needs: Not on file  Physical Activity: Not on file  Stress: Not on file  Social Connections: Not on file  Intimate Partner Violence: Not on file     Review of Systems: Review of Systems  Constitutional:  Negative for chills, fever and malaise/fatigue.  HENT:  Negative for congestion, sore throat and tinnitus.   Eyes:  Negative for blurred vision and redness.  Respiratory:  Negative for cough, shortness of breath and wheezing.   Cardiovascular:  Negative for chest pain, palpitations, claudication and leg swelling.  Gastrointestinal:  Negative for abdominal pain, blood in stool, diarrhea, nausea and vomiting.  Genitourinary:  Negative for flank pain, frequency and hematuria.  Musculoskeletal:  Positive for falls. Negative for back pain  and myalgias.  Skin:  Negative for rash.  Neurological:  Positive for weakness. Negative for dizziness and headaches.  Endo/Heme/Allergies:  Does not bruise/bleed easily.  Psychiatric/Behavioral:  Negative for depression. The patient is not nervous/anxious and does not have insomnia.    Vital Signs: Blood pressure (!) 154/63, pulse (!) 113, temperature 98.4 F (36.9 C), temperature source Oral, resp. rate 18, height 5\' 3"  (1.6 m), weight 94 kg, SpO2 98 %.  Weight trends: Filed Weights   12/18/21 1203 12/18/21 1736 12/19/21 0500  Weight: 99.3 kg 99.3 kg 94 kg    Physical Exam: General: NAD, Resting in bed  Head: Normocephalic, atraumatic. Moist oral mucosal membranes  Eyes: Anicteric  Lungs:  Clear to auscultation, normal effort, room air  Heart: Regular rate and rhythm  Abdomen:  Soft, nontender, obese  Extremities: 1+ left lower peripheral edema.  Neurologic: Nonfocal, moving all four extremities  Skin: No lesions  Access: None     Lab results: Basic Metabolic Panel: Recent Labs  Lab 12/18/21 1213 12/19/21 0512  NA 137 137  K 5.1 4.1  CL 107 109  CO2 13* 13*  GLUCOSE 453* 312*  BUN 101* 106*  CREATININE 5.54* 5.15*  CALCIUM 9.7 9.1  MG  --  1.6*    Liver Function Tests: Recent Labs  Lab 12/18/21 1213 12/19/21 0512  AST 42* 144*  ALT 35 71*  ALKPHOS 157* 149*  BILITOT 0.8 0.6  PROT 7.2 6.1*  ALBUMIN 2.7* 2.3*   No results for input(s): LIPASE, AMYLASE in the last 168 hours. No results for input(s):  AMMONIA in the last 168 hours.  CBC: Recent Labs  Lab 12/18/21 1213 12/19/21 0512  WBC 16.6* 12.2*  NEUTROABS 14.7* 9.4*  HGB 7.6* 6.5*  HCT 24.0* 20.4*  MCV 99.2 97.1  PLT 417* 361    Cardiac Enzymes: Recent Labs  Lab 12/18/21 1213  CKTOTAL 270*    BNP: Invalid input(s): POCBNP  CBG: Recent Labs  Lab 12/18/21 2153 12/18/21 2155 12/19/21 0236 12/19/21 0739 12/19/21 1207  GLUCAP 511* 480* 332* 320* 203*    Microbiology: No  results found for this or any previous visit.  Coagulation Studies: No results for input(s): LABPROT, INR in the last 72 hours.  Urinalysis: Recent Labs    12/18/21 1213  COLORURINE YELLOW*  LABSPEC 1.013  PHURINE 5.0  GLUCOSEU >=500*  HGBUR LARGE*  BILIRUBINUR NEGATIVE  KETONESUR 5*  PROTEINUR 100*  NITRITE NEGATIVE  LEUKOCYTESUR LARGE*      Imaging: DG Chest 2 View  Result Date: 12/18/2021 CLINICAL DATA:  Fall, weakness EXAM: CHEST - 2 VIEW COMPARISON:  None Available. FINDINGS: Patient rotation. No consolidation. No visible pleural effusions or pneumothorax. Cardiomediastinal silhouette is within normal limits when accounting for patient rotation. Mildly displaced right lateral seventh rib fracture IMPRESSION: 1. Mildly displaced right lateral seventh rib fracture, potentially acute. 2. No other acute abnormality identified. Electronically Signed   By: Margaretha Sheffield M.D.   On: 12/18/2021 13:23   DG Pelvis 1-2 Views  Result Date: 12/18/2021 CLINICAL DATA:  Fall 2 days ago EXAM: PELVIS - 1-2 VIEW COMPARISON:  None Available. FINDINGS: Patient is rotated. There is no evidence of pelvic fracture or diastasis. No pelvic bone lesions are seen. Prominent atherosclerotic vascular calcifications. IMPRESSION: Negative. Electronically Signed   By: Davina Poke D.O.   On: 12/18/2021 13:22   CT Head Wo Contrast  Result Date: 12/18/2021 CLINICAL DATA:  Head trauma EXAM: CT HEAD WITHOUT CONTRAST TECHNIQUE: Contiguous axial images were obtained from the base of the skull through the vertex without intravenous contrast. RADIATION DOSE REDUCTION: This exam was performed according to the departmental dose-optimization program which includes automated exposure control, adjustment of the mA and/or kV according to patient size and/or use of iterative reconstruction technique. COMPARISON:  None Available. FINDINGS: Brain: No acute intracranial hemorrhage, mass effect, or herniation. No  extra-axial fluid collections. No evidence of acute territorial infarct. No hydrocephalus. Mild cortical volume loss. Mild patchy hypodensities in the periventricular and subcortical white matter, likely secondary to chronic microvascular ischemic changes. Vascular: Calcified plaques in the carotid siphons. Skull: Normal. Negative for fracture or focal lesion. Sinuses/Orbits: No acute finding. Other: None. IMPRESSION: Chronic changes with no acute intracranial process identified. Electronically Signed   By: Ofilia Neas M.D.   On: 12/18/2021 13:04   CT Cervical Spine Wo Contrast  Result Date: 12/18/2021 CLINICAL DATA:  Neck trauma EXAM: CT CERVICAL SPINE WITHOUT CONTRAST TECHNIQUE: Multidetector CT imaging of the cervical spine was performed without intravenous contrast. Multiplanar CT image reconstructions were also generated. RADIATION DOSE REDUCTION: This exam was performed according to the departmental dose-optimization program which includes automated exposure control, adjustment of the mA and/or kV according to patient size and/or use of iterative reconstruction technique. COMPARISON:  None Available. FINDINGS: Alignment: Grade 1 anterolisthesis of C6 on C7. No acute subluxation. Skull base and vertebrae: No acute fracture. No primary bone lesion or focal pathologic process. Soft tissues and spinal canal: No prevertebral fluid or swelling. No visible canal hematoma. Disc levels: Mild to moderate intervertebral disc height loss at C4-C5  and C5-C6 with accompanying endplate osteophytes and uncovertebral spurring. Bilateral mild facet arthropathy. Upper chest: No acute process identified. Other: Coarse calcified plaques noted in the right proximal ICA. IMPRESSION: No acute fracture or malalignment identified. Chronic findings as described. Electronically Signed   By: Ofilia Neas M.D.   On: 12/18/2021 13:07   US PELVIS (TRANSABDOMINAL ONLY)  Result Date: 12/19/2021 CLINICAL DATA:  Abnormal  vaginal bleeding. EXAM: TRANSABDOMINAL ULTRASOUND OF PELVIS TECHNIQUE: Transabdominal ultrasound examination of the pelvis was performed including evaluation of the uterus, ovaries, adnexal regions, and pelvic cul-de-sac. Patient declined transvaginal examination. COMPARISON:  None Available. FINDINGS: Uterus Measurements: 10.1 x 5.7 x 4.8 cm = volume: 145 mL. Shadowing echogenic structure measuring 1.5 x 1.2 x 0.9 cm in the uterine fundus. Endometrium Thickness: 6.4 mm. Heterogeneous appearance of the endometrium along the lower uterine segment. Right ovary Not visualized. Left ovary Not visualized. Other findings:  No abnormal free fluid. IMPRESSION: Evaluation is somewhat limited by patient's refusal of the transvaginal portion of the examination. Within this context: 1. Heterogeneous appearance of the endometrium along the lower uterine segment with the endometrial stripe measuring 6.4 mm, consider further evaluation with endometrial sampling versus sonohysterogram in the setting of abnormal postmenopausal uterine bleeding. 2. Shadowing echogenic structure measuring 1.5 cm in the uterine fundus, likely a calcified fibroid. 3. Bilateral ovaries are not visualized and may be atrophic or obscured by bowel gas. Electronically Signed   By: Dahlia Bailiff M.D.   On: 12/19/2021 13:17   US Venous Img Lower Unilateral Left  Result Date: 12/18/2021 CLINICAL DATA:  Unilateral leg swelling EXAM: LEFT LOWER EXTREMITY VENOUS DOPPLER ULTRASOUND TECHNIQUE: Gray-scale sonography with graded compression, as well as color Doppler and duplex ultrasound were performed to evaluate the lower extremity deep venous systems from the level of the common femoral vein and including the common femoral, femoral, profunda femoral, popliteal and calf veins including the posterior tibial, peroneal and gastrocnemius veins when visible. The superficial great saphenous vein was also interrogated. Spectral Doppler was utilized to evaluate flow  at rest and with distal augmentation maneuvers in the common femoral, femoral and popliteal veins. COMPARISON:  None Available. FINDINGS: Contralateral Common Femoral Vein: Respiratory phasicity is normal and symmetric with the symptomatic side. No evidence of thrombus. Normal compressibility. Common Femoral Vein: No evidence of thrombus. Normal compressibility, respiratory phasicity and response to augmentation. Saphenofemoral Junction: No evidence of thrombus. Normal compressibility and flow on color Doppler imaging. Profunda Femoral Vein: No evidence of thrombus. Normal compressibility and flow on color Doppler imaging. Femoral Vein: No evidence of thrombus. Normal compressibility, respiratory phasicity and response to augmentation. Popliteal Vein: No evidence of thrombus. Normal compressibility, respiratory phasicity and response to augmentation. Calf Veins: No evidence of thrombus. Normal compressibility and flow on color Doppler imaging. Superficial Great Saphenous Vein: No evidence of thrombus. Normal compressibility. Venous Reflux:  None. Other Findings:  None. IMPRESSION: No evidence of left lower extremity deep venous thrombosis. Electronically Signed   By: Davina Poke D.O.   On: 12/18/2021 14:38     Assessment & Plan: Ms. Jasmine Mcbeth is a 66 y.o.  female with past medical conditions including hypertension, diabetes, chronic pain syndrome, and chronic kidney disease stage V, who was admitted to [facility] on 12/18/2021 for Dehydration [E86.0] Generalized weakness [R53.1] Acute kidney injury ([place]) [N17.9] Fall at home [W19.XXXA, Y92.009] Traumatic rhabdomyolysis, initial encounter ([place]) [T79.6XXA] Acute blood loss anemia [D62]   Acute kidney injury on chronic kidney disease stage V.  Acute kidney  injury likely due to rhabdomylysis from fall and prolonged immobility.  Also experienced dehydration during this time.  Updated labs retrieved from nephrology office show creatinine 3.66 on  08/26/2021.Marland Kitchen No acute need for dialysis at this time, Patient not experiencing shortness of breath or uremic symptoms. Will order gentle hydration with strict intake and outputs. Will monitor closely over the weekend and determine if dialysis is needed this admission. I have discussed this with patient and she is agreeable to proceed with dialysis, if needed. Patient is encouraged to improve oral intake.   2.  Hypertension with chronic kidney disease.  Home regimen includes furosemide and valsartan-hydrochlorothiazide.  Currently taking losartan.  Diuretics held due to kidney injury.  Blood pressure currently 153/63.  3. Anemia of chronic kidney disease Lab Results  Component Value Date   HGB 6.5 (L) 12/19/2021  Hemoglobin below desired target.  Primary team has ordered 1 unit red blood cells.  We will continue to monitor  4. Diabetes mellitus type II with chronic kidney disease insulin dependent. Home regimen includes Humulin. Most recent hemoglobin A1c is 10.0 on 12/18/21.   Blood glucose elevated on admission.  Remains elevated but improved.  Primary team to manage sliding scale insulin.   LOS: 0   5/19/20232:39 PM

## 2021-12-19 NOTE — Progress Notes (Signed)
   12/19/21 0739  Assess: MEWS Score  Temp 97.9 F (36.6 C)  BP 121/63  Pulse Rate (!) 119  Resp 18  SpO2 99 %  O2 Device Room Air  Assess: MEWS Score  MEWS Temp 0  MEWS Systolic 0  MEWS Pulse 2  MEWS RR 0  MEWS LOC 0  MEWS Score 2  MEWS Score Color Yellow  Assess: if the MEWS score is Yellow or Red  Were vital signs taken at a resting state? Yes  Focused Assessment Change from prior assessment (see assessment flowsheet)  Does the patient meet 2 or more of the SIRS criteria? No  MEWS guidelines implemented *See Row Information* Yes  Treat  MEWS Interventions Other (Comment) (continue to monitor, elevated HR)  Pain Scale 0-10  Pain Score 0  Take Vital Signs  Increase Vital Sign Frequency  Yellow: Q 2hr X 2 then Q 4hr X 2, if remains yellow, continue Q 4hrs  Escalate  MEWS: Escalate Yellow: discuss with charge nurse/RN and consider discussing with provider and RRT  Notify: Charge Nurse/RN  Name of Charge Nurse/RN Notified Joylene Igo RN  Date Charge Nurse/RN Notified 12/19/21  Time Charge Nurse/RN Notified [phone]  Assess: SIRS CRITERIA  SIRS Temperature  0  SIRS Pulse 1  SIRS Respirations  0  SIRS WBC 0  SIRS Score Sum  1

## 2021-12-19 NOTE — Consult Note (Signed)
GYN CONSULT  Reason for Consult: Post-Menopausal Vaginal Bleeding Referring Physician: Sharen Hones, MD  Theresa Warren is an 66 y.o. female who is currently inpatient in orthopedics. She was referred for post-menopausal vaginal bleeding. Menopause occurred at age 8 for Theresa Warren. She reports that since menopause, she had not had any vaginal bleeding at all. However, two weeks ago, she started having some brownish bleeding that progressed to heavy, red bleeding. At her heaviest flow, she was changing a tampon q2-3 hours and passed some small clots. The bleeding has now mostly stopped. She does reports a vaginal odor. She denies abnormal discharge, history of fibroids, cysts, abnormal Pap smears, or STIs. She is not currently sexually active. She denies any personal or family history of endometrial cancer. She reports occasional yeast infections. Pelvic US shows endometrial thickness of 6.4 mm and a structure that is likely a calcified fibroid in the fundus.  Pertinent Gynecological History: Menses:  Menopause at age 52 Bleeding: post menopausal bleeding Contraception: post menopausal status Blood transfusions:  Currently infusing Sexually transmitted diseases: no past history and not currently sexually active Previous GYN Procedures:  denies   Last pap: normal Date: Unsure. More than 5 years ago OB History: G0P0   Menstrual History: Menarche age: 82 No LMP recorded. Patient is postmenopausal.    Past Medical History:  Diagnosis Date   CKD (chronic kidney disease), stage III ([place])    Diabetes ([place])    Hyperlipidemia    Hypertension    Obesity     Past Surgical History:  Procedure Laterality Date   ACHILLES TENDON REPAIR     ANKLE SURGERY     URETHRAL STRICTURE DILATATION      Family History  Problem Relation Age of Onset   Alzheimer's disease Father    Diabetes Father    CAD Father 27   CAD Other        Multliple maternal aunts and uncles with early onset heart  disease   Lung cancer Sister    ALS Mother     Social History:  reports that she has never smoked. She does not have any smokeless tobacco history on file. She reports that she does not currently use alcohol. She reports that she does not currently use drugs.  Allergies:  Allergies  Allergen Reactions   Codeine Nausea And Vomiting   Sulfa Antibiotics Swelling   Clindamycin/Lincomycin Rash   Penicillins Rash    Medications: I have reviewed the patient's current medications.  Review of Systems  Blood pressure (!) 154/63, pulse (!) 113, temperature 98.4 F (36.9 C), temperature source Oral, resp. rate 18, height 5\' 3"  (1.6 m), weight 94 kg, SpO2 98 %.  Physical Exam Cardiac: RRR, murmur noted Lungs: CTAB Abdomen: soft, non-tender, normal bowel sounds. Mild suprapubic tenderness with deep palpation. GU: normal external genitalia. Scant amount of dark brown blood noted with wipe. No active bleeding. Moderate odor noted. Wet prep collected.  Results for orders placed or performed during the hospital encounter of 12/18/21 (from the past 48 hour(s))  CBG monitoring, ED     Status: Abnormal   Collection Time: 12/18/21 12:01 PM  Result Value Ref Range   Glucose-Capillary 457 (H) 70 - 99 mg/dL    Comment: Glucose reference range applies only to samples taken after fasting for at least 8 hours.  Blood gas, venous     Status: Abnormal   Collection Time: 12/18/21 12:05 PM  Result Value Ref Range   pH, Ven 7.22 (L) 7.25 -  7.43   pCO2, Ven 32 (L) 44 - 60 mmHg   pO2, Ven 37 32 - 45 mmHg   Bicarbonate 13.1 (L) 20.0 - 28.0 mmol/L   Acid-base deficit 13.4 (H) 0.0 - 2.0 mmol/L   O2 Saturation 57.4 %   Patient temperature 37.0    Collection site VENOUS     Comment: Performed at [facility], [address] [place], [place] [address]  Comprehensive metabolic panel     Status: Abnormal   Collection Time: 12/18/21 12:13 PM  Result Value Ref Range   Sodium 137 135 - 145 mmol/L    Potassium 5.1 3.5 - 5.1 mmol/L   Chloride 107 98 - 111 mmol/L   CO2 13 (L) 22 - 32 mmol/L   Glucose, Bld 453 (H) 70 - 99 mg/dL    Comment: Glucose reference range applies only to samples taken after fasting for at least 8 hours.   BUN 101 (H) 8 - 23 mg/dL    Comment: RESULTS CONFIRMED BY MANUAL DILUTION MW    Creatinine, Ser 5.54 (H) 0.44 - 1.00 mg/dL   Calcium 9.7 8.9 - 10.3 mg/dL   Total Protein 7.2 6.5 - 8.1 g/dL   Albumin 2.7 (L) 3.5 - 5.0 g/dL   AST 42 (H) 15 - 41 U/L   ALT 35 0 - 44 U/L   Alkaline Phosphatase 157 (H) 38 - 126 U/L   Total Bilirubin 0.8 0.3 - 1.2 mg/dL   GFR, Estimated 8 (L) >60 mL/min    Comment: (NOTE) Calculated using the CKD-EPI Creatinine Equation (2021)    Anion gap 17 (H) 5 - 15    Comment: Performed at [facility], [address] [place], [place] [address]  CBC with Differential     Status: Abnormal   Collection Time: 12/18/21 12:13 PM  Result Value Ref Range   WBC 16.6 (H) 4.0 - 10.5 K/uL   RBC 2.42 (L) 3.87 - 5.11 MIL/uL   Hemoglobin 7.6 (L) 12.0 - 15.0 g/dL   HCT 24.0 (L) 36.0 - 46.0 %   MCV 99.2 80.0 - 100.0 fL   MCH 31.4 26.0 - 34.0 pg   MCHC 31.7 30.0 - 36.0 g/dL   RDW 13.4 11.5 - 15.5 %   Platelets 417 (H) 150 - 400 K/uL   nRBC 0.0 0.0 - 0.2 %   Neutrophils Relative % 88 %   Neutro Abs 14.7 (H) 1.7 - 7.7 K/uL   Lymphocytes Relative 7 %   Lymphs Abs 1.1 0.7 - 4.0 K/uL   Monocytes Relative 4 %   Monocytes Absolute 0.6 0.1 - 1.0 K/uL   Eosinophils Relative 0 %   Eosinophils Absolute 0.0 0.0 - 0.5 K/uL   Basophils Relative 0 %   Basophils Absolute 0.0 0.0 - 0.1 K/uL   Immature Granulocytes 1 %   Abs Immature Granulocytes 0.11 (H) 0.00 - 0.07 K/uL    Comment: Performed at [facility], [place], [place] [address]  Troponin I (High Sensitivity)     Status: Abnormal   Collection Time: 12/18/21 12:13 PM  Result Value Ref Range   Troponin I (High Sensitivity) 20 (H) <18 ng/L    Comment:  (NOTE) Elevated high sensitivity troponin I (hsTnI) values and significant  changes across serial measurements may suggest ACS but many other  chronic and acute conditions are known to elevate hsTnI results.  Refer to the Links section for chest pain algorithms and additional  guidance. Performed at [facility], [address] [place], [place] [address]   Lactic acid, plasma     Status: None   Collection Time: 12/18/21 12:13 PM  Result Value Ref Range   Lactic Acid, Venous 1.5 0.5 - 1.9 mmol/L    Comment: Performed at [facility], [address] [place], [place] [address]  Urinalysis, Routine w reflex microscopic Urine, Clean Catch     Status: Abnormal   Collection Time: 12/18/21 12:13 PM  Result Value Ref Range   Color, Urine YELLOW (A) YELLOW   APPearance TURBID (A) CLEAR   Specific Gravity, Urine 1.013 1.005 - 1.030   pH 5.0 5.0 - 8.0   Glucose, UA >=500 (A) NEGATIVE mg/dL   Hgb urine dipstick LARGE (A) NEGATIVE   Bilirubin Urine NEGATIVE NEGATIVE   Ketones, ur 5 (A) NEGATIVE mg/dL   Protein, ur 100 (A) NEGATIVE mg/dL   Nitrite NEGATIVE NEGATIVE   Leukocytes,Ua LARGE (A) NEGATIVE   RBC / HPF >50 (H) 0 - 5 RBC/hpf   WBC, UA >50 (H) 0 - 5 WBC/hpf   Bacteria, UA MANY (A) NONE SEEN   Squamous Epithelial / LPF NONE SEEN 0 - 5    Comment: Performed at [facility], [address] [place], [place] [address]  CK     Status: Abnormal   Collection Time: 12/18/21 12:13 PM  Result Value Ref Range   Total CK 270 (H) 38 - 234 U/L    Comment: Performed at [facility], [place], [place] [address]  Lactic acid, plasma     Status: None   Collection Time: 12/18/21  6:37 PM  Result Value Ref Range   Lactic Acid, Venous 1.1 0.5 - 1.9 mmol/L    Comment: Performed at [facility], [place], [place] [address]  Troponin I (High Sensitivity)     Status: Abnormal   Collection Time: 12/18/21  6:37 PM  Result  Value Ref Range   Troponin I (High Sensitivity) 21 (H) <18 ng/L    Comment: (NOTE) Elevated high sensitivity troponin I (hsTnI) values and significant  changes across serial measurements may suggest ACS but many other  chronic and acute conditions are known to elevate hsTnI results.  Refer to the "Links" section for chest pain algorithms and additional  guidance. Performed at [facility], [address] [place], [place] [address]   Hemoglobin A1c     Status: Abnormal   Collection Time: 12/18/21  6:37 PM  Result Value Ref Range   Hgb A1c MFr Bld 10.0 (H) 4.8 - 5.6 %    Comment: (NOTE) Pre diabetes:          5.7%-6.4%  Diabetes:              >6.4%  Glycemic control for   <7.0% adults with diabetes    Mean Plasma Glucose 240.3 mg/dL    Comment: Performed at [facility] [address], [place], [place] [address]  HIV Antibody (routine testing w rflx)     Status: None   Collection Time: 12/18/21  6:37 PM  Result Value Ref Range   HIV Screen 4th Generation wRfx Non Reactive Non Reactive    Comment: Performed at [facility], [address] [place], [place] [address]  Glucose, capillary     Status: Abnormal   Collection Time: 12/18/21  9:53 PM  Result Value Ref Range   Glucose-Capillary 511 (HH) 70 - 99 mg/dL  Comment: Glucose reference range applies only to samples taken after fasting for at least 8 hours.   Comment 1 Notify RN   Glucose, capillary     Status: Abnormal   Collection Time: 12/18/21  9:55 PM  Result Value Ref Range   Glucose-Capillary 480 (H) 70 - 99 mg/dL    Comment: Glucose reference range applies only to samples taken after fasting for at least 8 hours.   Comment 1 Notify RN   Glucose, capillary     Status: Abnormal   Collection Time: 12/19/21  2:36 AM  Result Value Ref Range   Glucose-Capillary 332 (H) 70 - 99 mg/dL    Comment: Glucose reference range applies only to samples taken after fasting for at least 8 hours.  CBC with  Differential/Platelet     Status: Abnormal   Collection Time: 12/19/21  5:12 AM  Result Value Ref Range   WBC 12.2 (H) 4.0 - 10.5 K/uL   RBC 2.10 (L) 3.87 - 5.11 MIL/uL   Hemoglobin 6.5 (L) 12.0 - 15.0 g/dL   HCT 20.4 (L) 36.0 - 46.0 %   MCV 97.1 80.0 - 100.0 fL   MCH 31.0 26.0 - 34.0 pg   MCHC 31.9 30.0 - 36.0 g/dL   RDW 13.2 11.5 - 15.5 %   Platelets 361 150 - 400 K/uL   nRBC 0.0 0.0 - 0.2 %   Neutrophils Relative % 76 %   Neutro Abs 9.4 (H) 1.7 - 7.7 K/uL   Lymphocytes Relative 15 %   Lymphs Abs 1.8 0.7 - 4.0 K/uL   Monocytes Relative 7 %   Monocytes Absolute 0.8 0.1 - 1.0 K/uL   Eosinophils Relative 1 %   Eosinophils Absolute 0.1 0.0 - 0.5 K/uL   Basophils Relative 0 %   Basophils Absolute 0.0 0.0 - 0.1 K/uL   Immature Granulocytes 1 %   Abs Immature Granulocytes 0.09 (H) 0.00 - 0.07 K/uL    Comment: Performed at [facility], [address] [place], [place] [address]  Comprehensive metabolic panel     Status: Abnormal   Collection Time: 12/19/21  5:12 AM  Result Value Ref Range   Sodium 137 135 - 145 mmol/L   Potassium 4.1 3.5 - 5.1 mmol/L   Chloride 109 98 - 111 mmol/L   CO2 13 (L) 22 - 32 mmol/L   Glucose, Bld 312 (H) 70 - 99 mg/dL    Comment: Glucose reference range applies only to samples taken after fasting for at least 8 hours.   BUN 106 (H) 8 - 23 mg/dL    Comment: RESULT CONFIRMED BY MANUAL DILUTION RH   Creatinine, Ser 5.15 (H) 0.44 - 1.00 mg/dL   Calcium 9.1 8.9 - 10.3 mg/dL   Total Protein 6.1 (L) 6.5 - 8.1 g/dL   Albumin 2.3 (L) 3.5 - 5.0 g/dL   AST 144 (H) 15 - 41 U/L   ALT 71 (H) 0 - 44 U/L   Alkaline Phosphatase 149 (H) 38 - 126 U/L   Total Bilirubin 0.6 0.3 - 1.2 mg/dL   GFR, Estimated 9 (L) >60 mL/min    Comment: (NOTE) Calculated using the CKD-EPI Creatinine Equation (2021)    Anion gap 15 5 - 15    Comment: Performed at [facility], [address] [place], [place] [address]  Magnesium     Status: Abnormal   Collection  Time: 12/19/21  5:12 AM  Result Value Ref Range   Magnesium 1.6 (L) 1.7 -  2.4 mg/dL    Comment: Performed at [facility], [address] [place], [place] [address]  ABO/Rh     Status: None   Collection Time: 12/19/21  5:12 AM  Result Value Ref Range   ABO/RH(D)      O POS Performed at [facility], [address] [place], [place] [address]   Glucose, capillary     Status: Abnormal   Collection Time: 12/19/21  7:39 AM  Result Value Ref Range   Glucose-Capillary 320 (H) 70 - 99 mg/dL    Comment: Glucose reference range applies only to samples taken after fasting for at least 8 hours.   Comment 1 Notify RN    Comment 2 Document in Chart   Prepare RBC (crossmatch)     Status: None   Collection Time: 12/19/21  9:19 AM  Result Value Ref Range   Order Confirmation      ORDER PROCESSED BY BLOOD BANK Performed at [facility], [address] [place], [place] [address]   Type and screen [place]     Status: None (Preliminary result)   Collection Time: 12/19/21  9:56 AM  Result Value Ref Range   ABO/RH(D) O POS    Antibody Screen NEG    Sample Expiration 12/22/2021,2359    Unit Number [id number]    Blood Component Type RBC LR PHER2    Unit division 00    Status of Unit ISSUED    Transfusion Status OK TO TRANSFUSE    Crossmatch Result      Compatible Performed at [facility], [address] [place], [place] [address]   Iron and TIBC     Status: Abnormal   Collection Time: 12/19/21  9:56 AM  Result Value Ref Range   Iron 33 28 - 170 ug/dL   TIBC 167 (L) 250 - 450 ug/dL   Saturation Ratios 20 10.4 - 31.8 %   UIBC 134 ug/dL    Comment: Performed at [facility], [address], [place], [place] [address]  Ferritin     Status: Abnormal   Collection Time: 12/19/21  9:56 AM  Result Value Ref Range   Ferritin 512 (H) 11 - 307 ng/mL    Comment: Performed at [facility], [address]  [place],  [address]  Vitamin B12     Status: None   Collection Time: 12/19/21  9:56 AM  Result Value Ref Range   Vitamin B-12 860 180 - 914 pg/mL    Comment: (NOTE) This assay is not validated for testing neonatal or myeloproliferative syndrome specimens for Vitamin B12 levels. Performed at [facility], [place] [address] [place], [place] [address]   Glucose, capillary     Status: Abnormal   Collection Time: 12/19/21 12:07 PM  Result Value Ref Range   Glucose-Capillary 203 (H) 70 - 99 mg/dL    Comment: Glucose reference range applies only to samples taken after fasting for at least 8 hours.  Wet prep, genital     Status: None   Collection Time: 12/19/21  2:28 PM   Specimen: Vaginal  Result Value Ref Range   Yeast Wet Prep HPF POC NONE SEEN NONE SEEN   Trich, Wet Prep NONE SEEN NONE SEEN   Clue Cells Wet Prep HPF POC NONE SEEN NONE SEEN   WBC, Wet Prep HPF POC <10 <10   Sperm NONE SEEN     Comment: Performed at [facility], [address] [place], [place] [address]     Assessment 1) Post-menopausal uterine bleeding, resolved 2) Wet prep: normal  Plan 1) Follow up with Dr. Marcelline Mates once discharged for possible endometrial biopsy and further evaluation. 2) If bleeding resumes, recommend 10-day course of Provera.  Lenna Sciara Cherylann Parr 12/19/2021

## 2021-12-20 DIAGNOSIS — D62 Acute posthemorrhagic anemia: Secondary | ICD-10-CM | POA: Diagnosis not present

## 2021-12-20 DIAGNOSIS — N185 Chronic kidney disease, stage 5: Secondary | ICD-10-CM | POA: Diagnosis not present

## 2021-12-20 DIAGNOSIS — W19XXXA Unspecified fall, initial encounter: Secondary | ICD-10-CM | POA: Diagnosis not present

## 2021-12-20 DIAGNOSIS — L8990 Pressure ulcer of unspecified site, unspecified stage: Secondary | ICD-10-CM | POA: Insufficient documentation

## 2021-12-20 DIAGNOSIS — N939 Abnormal uterine and vaginal bleeding, unspecified: Secondary | ICD-10-CM | POA: Diagnosis not present

## 2021-12-20 LAB — GLUCOSE, CAPILLARY
Glucose-Capillary: 221 mg/dL — ABNORMAL HIGH (ref 70–99)
Glucose-Capillary: 293 mg/dL — ABNORMAL HIGH (ref 70–99)
Glucose-Capillary: 107 mg/dL — ABNORMAL HIGH (ref 70–99)

## 2021-12-20 LAB — BPAM RBC
Blood Product Expiration Date: 202306182359
ISSUE DATE / TIME: 202305191301
Unit Type and Rh: 5100

## 2021-12-20 LAB — TYPE AND SCREEN
ABO/RH(D): O POS
Antibody Screen: NEGATIVE
Unit division: 0

## 2021-12-20 LAB — BASIC METABOLIC PANEL
Anion gap: 13 (ref 5–15)
BUN: 92 mg/dL — ABNORMAL HIGH (ref 8–23)
CO2: 14 mmol/L — ABNORMAL LOW (ref 22–32)
Calcium: 9 mg/dL (ref 8.9–10.3)
Chloride: 112 mmol/L — ABNORMAL HIGH (ref 98–111)
Creatinine, Ser: 4.68 mg/dL — ABNORMAL HIGH (ref 0.44–1.00)
GFR, Estimated: 10 mL/min — ABNORMAL LOW (ref >60)
Glucose, Bld: 243 mg/dL — ABNORMAL HIGH (ref 70–99)
Potassium: 3.8 mmol/L (ref 3.5–5.1)
Sodium: 139 mmol/L (ref 135–145)

## 2021-12-20 LAB — CBC
HCT: 23.1 % — ABNORMAL LOW (ref 36.0–46.0)
Hemoglobin: 7.5 g/dL — ABNORMAL LOW (ref 12.0–15.0)
MCH: 31.3 pg (ref 26.0–34.0)
MCHC: 32.5 g/dL (ref 30.0–36.0)
MCV: 96.3 fL (ref 80.0–100.0)
Platelets: 314 10*3/uL (ref 150–400)
RBC: 2.4 MIL/uL — ABNORMAL LOW (ref 3.87–5.11)
RDW: 13.5 % (ref 11.5–15.5)
WBC: 9.7 10*3/uL (ref 4.0–10.5)
nRBC: 0 % (ref 0.0–0.2)

## 2021-12-20 LAB — MAGNESIUM: Magnesium: 1.7 mg/dL (ref 1.7–2.4)

## 2021-12-20 MED ORDER — INSULIN GLARGINE-YFGN 100 UNIT/ML ~~LOC~~ SOLN
25.0000 [IU] | Freq: Two times a day (BID) | SUBCUTANEOUS | Status: DC
Start: 2021-12-20 — End: 2021-12-23
  Administered 2021-12-20 – 2021-12-23 (×6): 25 [IU] via SUBCUTANEOUS
  Filled 2021-12-20 (×7): qty 0.25

## 2021-12-20 MED ORDER — STERILE WATER FOR INJECTION IV SOLN
INTRAVENOUS | Status: DC
  Filled 2021-12-20 (×2): qty 150
  Filled 2021-12-20: qty 1000
  Filled 2021-12-20: qty 150

## 2021-12-20 MED ORDER — METOPROLOL TARTRATE 25 MG PO TABS
25.0000 mg | ORAL_TABLET | Freq: Two times a day (BID) | ORAL | Status: DC
  Administered 2021-12-20 – 2021-12-23 (×7): 25 mg via ORAL
  Filled 2021-12-20 (×7): qty 1

## 2021-12-20 NOTE — Plan of Care (Signed)
  Problem: Education: Goal: Knowledge of General Education information will improve Description: Including pain rating scale, medication(s)/side effects and non-pharmacologic comfort measures Outcome: Progressing   Problem: Health Behavior/Discharge Planning: Goal: Ability to manage health-related needs will improve Outcome: Progressing   Problem: Clinical Measurements: Goal: Ability to maintain clinical measurements within normal limits will improve Outcome: Progressing   Problem: Clinical Measurements: Goal: Will remain free from infection Outcome: Progressing   Problem: Clinical Measurements: Goal: Diagnostic test results will improve Outcome: Progressing   Problem: Clinical Measurements: Goal: Respiratory complications will improve Outcome: Progressing   Problem: Clinical Measurements: Goal: Cardiovascular complication will be avoided Outcome: Progressing   Problem: Coping: Goal: Level of anxiety will decrease Outcome: Progressing   Problem: Elimination: Goal: Will not experience complications related to bowel motility Outcome: Progressing   Problem: Elimination: Goal: Will not experience complications related to urinary retention Outcome: Progressing   Problem: Pain Managment: Goal: General experience of comfort will improve Outcome: Progressing   Problem: Safety: Goal: Ability to remain free from injury will improve Outcome: Progressing   Problem: Skin Integrity: Goal: Risk for impaired skin integrity will decrease Outcome: Progressing

## 2021-12-20 NOTE — Progress Notes (Signed)
[facility]  PROGRESS NOTE   Subjective:   Patient seen at bedside.  Denies any chest pain, shortness of breath. Appetite has improved somewhat.  Objective:  Vital signs: Blood pressure (!) 146/57, pulse (!) 117, temperature 98.1 F (36.7 C), resp. rate 18, height 5\' 3"  (1.6 m), weight 94.6 kg, SpO2 97 %.  Intake/Output Summary (Last 24 hours) at 12/20/2021 1459 Last data filed at 12/20/2021 1241 Gross per 24 hour  Intake 804 ml  Output 950 ml  Net -146 ml   Filed Weights   12/18/21 1736 12/19/21 0500 12/20/21 0500  Weight: 99.3 kg 94 kg 94.6 kg     Physical Exam: General:  No acute distress  Head:  Normocephalic, atraumatic. Moist oral mucosal membranes  Eyes:  Anicteric  Neck:  Supple  Lungs:   Clear to auscultation, normal effort  Heart:  S1S2 no rubs  Abdomen:   Soft, nontender, bowel sounds present  Extremities:  peripheral edema.  Neurologic:  Awake, alert, following commands  Skin:  No lesions  Access:     Basic Metabolic Panel: Recent Labs  Lab 12/18/21 1213 12/19/21 0512 12/20/21 0503  NA 137 137 139  K 5.1 4.1 3.8  CL 107 109 112*  CO2 13* 13* 14*  GLUCOSE 453* 312* 243*  BUN 101* 106* 92*  CREATININE 5.54* 5.15* 4.68*  CALCIUM 9.7 9.1 9.0  MG  --  1.6* 1.7    CBC: Recent Labs  Lab 12/18/21 1213 12/19/21 0512 12/19/21 1907 12/20/21 0503  WBC 16.6* 12.2*  --  9.7  NEUTROABS 14.7* 9.4*  --   --   HGB 7.6* 6.5* 7.5* 7.5*  HCT 24.0* 20.4* 23.7* 23.1*  MCV 99.2 97.1  --  96.3  PLT 417* 361  --  314     Urinalysis: Recent Labs    12/18/21 1213  COLORURINE YELLOW*  LABSPEC 1.013  PHURINE 5.0  GLUCOSEU >=500*  HGBUR LARGE*  BILIRUBINUR NEGATIVE  KETONESUR 5*  PROTEINUR 100*  NITRITE NEGATIVE  LEUKOCYTESUR LARGE*      Imaging: US PELVIS (TRANSABDOMINAL ONLY)  Result Date: 12/19/2021 CLINICAL DATA:  Abnormal vaginal bleeding. EXAM: TRANSABDOMINAL ULTRASOUND OF PELVIS TECHNIQUE: Transabdominal ultrasound  examination of the pelvis was performed including evaluation of the uterus, ovaries, adnexal regions, and pelvic cul-de-sac. Patient declined transvaginal examination. COMPARISON:  None Available. FINDINGS: Uterus Measurements: 10.1 x 5.7 x 4.8 cm = volume: 145 mL. Shadowing echogenic structure measuring 1.5 x 1.2 x 0.9 cm in the uterine fundus. Endometrium Thickness: 6.4 mm. Heterogeneous appearance of the endometrium along the lower uterine segment. Right ovary Not visualized. Left ovary Not visualized. Other findings:  No abnormal free fluid. IMPRESSION: Evaluation is somewhat limited by patient's refusal of the transvaginal portion of the examination. Within this context: 1. Heterogeneous appearance of the endometrium along the lower uterine segment with the endometrial stripe measuring 6.4 mm, consider further evaluation with endometrial sampling versus sonohysterogram in the setting of abnormal postmenopausal uterine bleeding. 2. Shadowing echogenic structure measuring 1.5 cm in the uterine fundus, likely a calcified fibroid. 3. Bilateral ovaries are not visualized and may be atrophic or obscured by bowel gas. Electronically Signed   By: Dahlia Bailiff M.D.   On: 12/19/2021 13:17     Medications:     sodium bicarbonate (isotonic) infusion in sterile water 50 mL/hr at 12/20/21 1353    atorvastatin  40 mg Oral Daily   heparin  5,000 Units Subcutaneous Q8H   insulin aspart  0-20 Units Subcutaneous  TID WC   insulin aspart  0-5 Units Subcutaneous QHS   insulin aspart  8 Units Subcutaneous TID WC   insulin glargine-yfgn  25 Units Subcutaneous BID   losartan  50 mg Oral Daily   metoprolol tartrate  25 mg Oral BID    Assessment/ Plan:     Principal Problem:   Fall at home Active Problems:   OSA (obstructive sleep apnea)   Chronic kidney disease, stage 5 (HCC)   Chronic pain syndrome   Essential hypertension   Morbid (severe) obesity due to excess calories ([place])   DNR (do not  resuscitate)/DNI(Do Not Intubate)   Type 2 diabetes mellitus with chronic kidney disease, with long-term current use of insulin (HCC)   Anemia of renal disease   Hypomagnesemia   Acute blood loss anemia   Abnormal vaginal bleeding   Uncontrolled type 2 diabetes mellitus with hyperglycemia, with long-term current use of insulin ([place])  66 year old female with obesity, hypertension, diabetes, peripheral vascular disease, chronic pain syndrome, chronic kidney disease stage IV/V now admitted with history of weakness and fall at home.  She is now found to have acute kidney injury on the top of chronic kidney disease.  She also has worsening anemia and was given a unit of PRBC.  #1: Acute kidney injury on CKD: Acute kidney injury is most likely secondary to rhabdomyolysis complicated by prerenal azotemia.  The renal indicis have improved with improved urine output. Continue IV fluid resuscitation at the present rate.  Continue to hold the  diuretics for now.  #2: Anemia: S/p 1 unit of PRBC.  Continue to monitor closely.  #3: Diabetes: Continue insulin as ordered.  #4: Hypertension: We will continue losartan and metoprolol for now.  If creatinine continues to get worse we will hold the losartan.  We can resume the diuretics if the renal indicis improved but possibly change to loop diuretics.  We will continue to follow closely.   LOS: [place], MD [facility] 5/20/20232:59 PM

## 2021-12-20 NOTE — Progress Notes (Signed)
  Progress Note   Patient: Theresa Warren URK:[id number] DOB: 07-15-1956 DOA: 12/18/2021     1 DOS: the patient was seen and examined on 12/20/2021   Brief hospital course: 66 year old female history of type 2 diabetes on insulin, CKD stage V, hypertension, chronic pain syndrome, hypertension presents to the ER today after a reported fall at home.   Patient has a mild elevation of CK at 270.  Received IV fluids.  She is also scheduled to have dialysis catheter placed as outpatient. Hemoglobin was also low at 7.6, dropped down to 6.5.  Patient had recent abnormal vaginal bleeding for the last months.  Received 1 unit PRBC.  Assessment and Plan: Fall at home. Patient states that he did not have any blackout.  Her CK level was 270, she received fluids. Continue PT/OT.  Anemia of chronic kidney disease. Acute blood loss anemia secondary to abnormal vaginal bleeding. Abnormal vaginal bleeding Patient received 1 unit PRBC yesterday, hemoglobin is better today.  Patient does not have iron and B12 deficiency. Patient has been seen by OB/GYN for vaginal bleeding.  Ultrasound showed focal thickened endometrium.  OB/GYN will follow patient as outpatient.  Stage V chronic kidney disease. Metabolic acidosis. Renal function is better after fluids.  There is acute component to it.  Due to severity of metabolic acidosis, fluids will be changed to bicarbonate drip.  Chronic pain syndrome. He does not have a significant pain  Uncontrolled type 2 diabetes with hyperglycemia. Glucose running high again, will increase dose of insulin.  Cautious because of renal failure.   Essential hypertension. Continue losartan.  Add metoprolol as patient still has elevated blood pressure with tachycardia   Hypomagnesemia. Recheck level tomorrow  Morbid obesity. BMI 36.9 with advanced renal disease and hypertension.  Patient qualified for morbid obesity.        Subjective:  Patient does not have any  additional vaginal bleeding. No short of breath  Physical Exam: Vitals:   12/19/21 2040 12/20/21 0437 12/20/21 0500 12/20/21 0720  BP: (!) 171/67 (!) 142/68  (!) 159/69  Pulse: (!) 108 (!) 112  (!) 113  Resp: 18 20  18   Temp: 97.8 F (36.6 C) 99 F (37.2 C)  98.6 F (37 C)  TempSrc:  Oral    SpO2: 99% 97%  99%  Weight:   94.6 kg   Height:       General exam: Appears calm and comfortable, morbid obese. Respiratory system: Clear to auscultation. Respiratory effort normal. Cardiovascular system: S1 & S2 heard, RRR. No JVD, murmurs, rubs, gallops or clicks.  Gastrointestinal system: Abdomen is nondistended, soft and nontender. No organomegaly or masses felt. Normal bowel sounds heard. Central nervous system: Alert and oriented. No focal neurological deficits. Extremities: Bilateral leg 1-2+ edema Skin: No rashes, lesions or ulcers Psychiatry: Judgement and insight appear normal. Mood & affect appropriate.   Data Reviewed:  Reviewed ultrasound results, reviewed all lab results.  Family Communication: Does not have any family member.  Disposition: Status is: Inpatient Remains inpatient appropriate because: Severity of disease, IV treatment, pending decision about  inpatient procedure.  Planned Discharge Destination: Home with Home Health    Time spent: 55 minutes  Author: Sharen Hones, MD 12/20/2021 11:06 AM  For on call review [email or web address].

## 2021-12-21 DIAGNOSIS — N939 Abnormal uterine and vaginal bleeding, unspecified: Secondary | ICD-10-CM | POA: Diagnosis not present

## 2021-12-21 DIAGNOSIS — W19XXXA Unspecified fall, initial encounter: Secondary | ICD-10-CM | POA: Diagnosis not present

## 2021-12-21 DIAGNOSIS — D62 Acute posthemorrhagic anemia: Secondary | ICD-10-CM | POA: Diagnosis not present

## 2021-12-21 DIAGNOSIS — Y92009 Unspecified place in unspecified non-institutional (private) residence as the place of occurrence of the external cause: Secondary | ICD-10-CM | POA: Diagnosis not present

## 2021-12-21 DIAGNOSIS — E876 Hypokalemia: Secondary | ICD-10-CM

## 2021-12-21 LAB — CBC
HCT: 23.4 % — ABNORMAL LOW (ref 36.0–46.0)
Hemoglobin: 7.4 g/dL — ABNORMAL LOW (ref 12.0–15.0)
MCH: 30.5 pg (ref 26.0–34.0)
MCHC: 31.6 g/dL (ref 30.0–36.0)
MCV: 96.3 fL (ref 80.0–100.0)
Platelets: 291 10*3/uL (ref 150–400)
RBC: 2.43 MIL/uL — ABNORMAL LOW (ref 3.87–5.11)
RDW: 13.7 % (ref 11.5–15.5)
WBC: 9.3 10*3/uL (ref 4.0–10.5)
nRBC: 0 % (ref 0.0–0.2)

## 2021-12-21 LAB — GLUCOSE, CAPILLARY
Glucose-Capillary: 106 mg/dL — ABNORMAL HIGH (ref 70–99)
Glucose-Capillary: 234 mg/dL — ABNORMAL HIGH (ref 70–99)
Glucose-Capillary: 199 mg/dL — ABNORMAL HIGH (ref 70–99)
Glucose-Capillary: 209 mg/dL — ABNORMAL HIGH (ref 70–99)

## 2021-12-21 LAB — BASIC METABOLIC PANEL
Anion gap: 11 (ref 5–15)
BUN: 99 mg/dL — ABNORMAL HIGH (ref 8–23)
CO2: 18 mmol/L — ABNORMAL LOW (ref 22–32)
Calcium: 8.8 mg/dL — ABNORMAL LOW (ref 8.9–10.3)
Chloride: 111 mmol/L (ref 98–111)
Creatinine, Ser: 4.27 mg/dL — ABNORMAL HIGH (ref 0.44–1.00)
GFR, Estimated: 11 mL/min — ABNORMAL LOW (ref >60)
Glucose, Bld: 131 mg/dL — ABNORMAL HIGH (ref 70–99)
Potassium: 3.1 mmol/L — ABNORMAL LOW (ref 3.5–5.1)
Sodium: 140 mmol/L (ref 135–145)

## 2021-12-21 LAB — MAGNESIUM: Magnesium: 1.5 mg/dL — ABNORMAL LOW (ref 1.7–2.4)

## 2021-12-21 MED ORDER — POTASSIUM CHLORIDE 10 MEQ/100ML IV SOLN
10.0000 meq | INTRAVENOUS | Status: AC
  Administered 2021-12-21 (×2): 10 meq via INTRAVENOUS
  Filled 2021-12-21 (×2): qty 100

## 2021-12-21 MED ORDER — MAGNESIUM SULFATE 2 GM/50ML IV SOLN
2.0000 g | Freq: Once | INTRAVENOUS | Status: AC
  Administered 2021-12-21: 2 g via INTRAVENOUS
  Filled 2021-12-21: qty 50

## 2021-12-21 MED ORDER — POTASSIUM CHLORIDE 20 MEQ PO PACK
40.0000 meq | PACK | Freq: Once | ORAL | Status: AC
  Administered 2021-12-21: 40 meq via ORAL
  Filled 2021-12-21: qty 2

## 2021-12-21 NOTE — Evaluation (Signed)
Occupational Therapy Evaluation Patient Details Name: Theresa Warren MRN: [id number] DOB: [birth year] Today's Date: 12/21/2021   History of Present Illness Pt is a 66 y/o F admitted on 12/18/21 after presenting to the ED with c/o a fall at home on Tuesday 12/16/21 & being on the floor since. Pt is scheduled to have dialysis catheter placed as outpoatient & c/o recent abnormal vaginal bleeding; low Hgb at 7.6. Pt is being treated for anemia of CKD, ABLA 2/2 abnormal vaginal bleeding. Pt received 1 unit PRBC & OB/GYN was consulted. PMH: DM2 on insulin, CKD 4, HTN, Chronic pain syndrome, HTN   Clinical Impression   Ms Yazdi was seen for OT/PT co-evaluation this date. Prior to hospital admission, pt was Independent for mobility and ADLs. Pt lives alone. Pt presents to acute OT demonstrating impaired ADL performance and functional mobility 2/2 decreased activity tolerance and functional strength/ROM/balance deficits. Pt currently requires MAX A don B socks seated EOB. MIN A + RW for BSC t/f ~8 ft. MAX A + RW pericare in standing, pt requires BUE support standing balance. Pt would benefit from skilled OT to address noted impairments and functional limitations (see below for any additional details). Upon hospital discharge, recommend STR to maximize pt safety and return to PLOF.    Recommendations for follow up therapy are one component of a multi-disciplinary discharge planning process, led by the attending physician.  Recommendations may be updated based on patient status, additional functional criteria and insurance authorization.   Follow Up Recommendations  Skilled nursing-short term rehab (<3 hours/day)    Assistance Recommended at Discharge Frequent or constant Supervision/Assistance  Patient can return home with the following A lot of help with walking and/or transfers;A lot of help with bathing/dressing/bathroom    Functional Status Assessment  Patient has had a recent decline in their  functional status and demonstrates the ability to make significant improvements in function in a reasonable and predictable amount of time.  Equipment Recommendations  BSC/3in1    Recommendations for Other Services       Precautions / Restrictions Precautions Precautions: Fall Restrictions Weight Bearing Restrictions: No      Mobility Bed Mobility               General bed mobility comments: pt seated EOB at start    Transfers Overall transfer level: Needs assistance Equipment used: Rolling walker (2 wheels) Transfers: Sit to/from Stand, Bed to chair/wheelchair/BSC Sit to Stand: Min assist     Step pivot transfers: Min assist, +2 safety/equipment     General transfer comment: initial MIN x2 improving to +2 for equipment only      Balance Overall balance assessment: Needs assistance Sitting-balance support: No upper extremity supported, Feet supported Sitting balance-Leahy Scale: Fair     Standing balance support: Bilateral upper extremity supported Standing balance-Leahy Scale: Poor                             ADL either performed or assessed with clinical judgement   ADL Overall ADL's : Needs assistance/impaired                                       General ADL Comments: MAX A don B socks seated EOB. MIN A + RW for BSC t/f ~8 ft. MAX A + RW pericare in standing, pt requires BUE support standing balance  Pertinent Vitals/Pain Pain Assessment Pain Assessment: Faces Faces Pain Scale: Hurts little more Pain Location: scratches & raw skin on buttocks Pain Descriptors / Indicators: Discomfort, Grimacing Pain Intervention(s): Limited activity within patient's tolerance, Repositioned     Hand Dominance     Extremity/Trunk Assessment Upper Extremity Assessment Upper Extremity Assessment: Generalized weakness   Lower Extremity Assessment Lower Extremity Assessment: Generalized weakness       Communication  Communication Communication: No difficulties   Cognition Arousal/Alertness: Awake/alert Behavior During Therapy: WFL for tasks assessed/performed, Flat affect Overall Cognitive Status: Within Functional Limits for tasks assessed                                       General Comments  secretions form wound on bottom - RN notified            Home Living Family/patient expects to be discharged to:: Private residence Living Arrangements: Alone   Type of Home: House Home Access: Stairs to enter Technical brewer of Steps: 4-5 Entrance Stairs-Rails: Right Home Layout: Two level;Able to live on main level with bedroom/bathroom               Home Equipment: Kasandra Knudsen - single point          Prior Functioning/Environment Prior Level of Function : Independent/Modified Independent;Driving             Mobility Comments: Pt reports she was independent, recently elected to purchase herself a cane, endorses 2 falls since last September.          OT Problem List: Decreased strength;Decreased range of motion;Decreased activity tolerance;Impaired balance (sitting and/or standing);Decreased safety awareness      OT Treatment/Interventions: Self-care/ADL training;Therapeutic exercise;Energy conservation;DME and/or AE instruction;Therapeutic activities;Patient/family education;Balance training    OT Goals(Current goals can be found in the care plan section) Acute Rehab OT Goals Patient Stated Goal: to return to PLOF OT Goal Formulation: With patient Time For Goal Achievement: 01/04/22 Potential to Achieve Goals: Good ADL Goals Pt Will Perform Grooming: with modified independence;standing Pt Will Perform Lower Body Dressing: with modified independence;sit to/from stand Pt Will Transfer to Toilet: with modified independence;ambulating;regular height toilet  OT Frequency: Min 2X/week    Co-evaluation PT/OT/SLP Co-Evaluation/Treatment: Yes Reason for  Co-Treatment: For patient/therapist safety;To address functional/ADL transfers PT goals addressed during session: Mobility/safety with mobility OT goals addressed during session: ADL's and self-care      AM-PAC OT "6 Clicks" Daily Activity     Outcome Measure Help from another person eating meals?: None Help from another person taking care of personal grooming?: A Little Help from another person toileting, which includes using toliet, bedpan, or urinal?: A Lot Help from another person bathing (including washing, rinsing, drying)?: A Lot Help from another person to put on and taking off regular upper body clothing?: A Little Help from another person to put on and taking off regular lower body clothing?: A Lot 6 Click Score: 16   End of Session Nurse Communication: Mobility status  Activity Tolerance: Patient tolerated treatment well Patient left: in chair;with call bell/phone within reach;with chair alarm set  OT Visit Diagnosis: Other abnormalities of gait and mobility (R26.89);Muscle weakness (generalized) (M62.81)                Time: 2725-3664 OT Time Calculation (min): 20 min Charges:  OT General Charges $OT Visit: 1 Visit OT Evaluation $OT Eval Moderate Complexity: 1 Mod  OT Treatments $Self Care/Home Management : 8-22 mins  Dessie Coma, M.S. OTR/L  12/21/21, 1:22 PM  ascom [phone]

## 2021-12-21 NOTE — Plan of Care (Signed)
  Problem: Education: Goal: Knowledge of General Education information will improve Description: Including pain rating scale, medication(s)/side effects and non-pharmacologic comfort measures Outcome: Progressing   Problem: Health Behavior/Discharge Planning: Goal: Ability to manage health-related needs will improve Outcome: Progressing   Problem: Clinical Measurements: Goal: Ability to maintain clinical measurements within normal limits will improve Outcome: Progressing   Problem: Clinical Measurements: Goal: Will remain free from infection Outcome: Progressing   Problem: Clinical Measurements: Goal: Diagnostic test results will improve Outcome: Progressing   Problem: Clinical Measurements: Goal: Respiratory complications will improve Outcome: Progressing Problem: Pain Managment: Goal: General experience of comfort will improve Outcome: Progressing   Problem: Safety: Goal: Ability to remain free from injury will improve Outcome: Progressing   Problem: Skin Integrity: Goal: Risk for impaired skin integrity will decrease Outcome: Progressing     Problem: Clinical Measurements: Goal: Cardiovascular complication will be avoided Outcome: Progressing   Problem: Elimination: Goal: Will not experience complications related to bowel motility Outcome: Progressing

## 2021-12-21 NOTE — Progress Notes (Signed)
  Progress Note   Patient: Theresa Warren PRF:[id number] DOB: [birth year] DOA: 12/18/2021     2 DOS: the patient was seen and examined on 12/21/2021   Brief hospital course: 66 year old female history of type 2 diabetes on insulin, CKD stage V, hypertension, chronic pain syndrome, hypertension presents to the ER today after a reported fall at home.   Patient has a mild elevation of CK at 270.  Received IV fluids.  She is also scheduled to have dialysis catheter placed as outpatient. Hemoglobin was also low at 7.6, dropped down to 6.5.  Patient had recent abnormal vaginal bleeding for the last months.  Received 1 unit PRBC.  Assessment and Plan:  Fall at home. Pending PT/OT.  Patient still has significant weakness.  Anemia of chronic kidney disease. Acute blood loss anemia secondary to abnormal vaginal bleeding. Abnormal vaginal bleeding Anemia of chronic kidney disease. Patient does not appear to have any iron or B12 deficiency.  Hemoglobin is still low but relatively stable after 1 unit PRBC.  Vaginal bleeding has stopped.  Patient be followed with OB/GYN as outpatient for further work-up.  Stage V chronic kidney disease. Metabolic acidosis. Hypokalemia Hypomagnesemia. Renal function continues to improve.  Continue bicarbonate drip.  Replete potassium  Chronic pain syndrome. He does not have a significant pain  Uncontrolled type 2 diabetes with hyperglycemia. No change in treatment plan today   Essential hypertension. Continue metoprolol and losartan.  Morbid obesity. BMI 36.9 with advanced renal disease and hypertension.  Patient qualified for morbid obesity.     Subjective:  Patient is doing better today, still has significant weakness.  Has not been able to get out of bed. Denies any short of breath.  Physical Exam: Vitals:   12/20/21 2025 12/21/21 0435 12/21/21 0453 12/21/21 0819  BP: (!) 149/61 (!) 149/66  (!) 130/59  Pulse: 92 85  87  Resp: (!) 22 18  18    Temp: 98.4 F (36.9 C) 97.7 F (36.5 C)  97.9 F (36.6 C)  TempSrc:    Oral  SpO2: 98% 99%  98%  Weight:   94.8 kg   Height:       General exam: Appears calm and comfortable, morbid obese Respiratory system: Clear to auscultation. Respiratory effort normal. Cardiovascular system: S1 & S2 heard, RRR. No JVD, murmurs, rubs, gallops or clicks.  Gastrointestinal system: Abdomen is nondistended, soft and nontender. No organomegaly or masses felt. Normal bowel sounds heard. Central nervous system: Alert and oriented. No focal neurological deficits. Extremities: 2+ leg edema Skin: No rashes, lesions or ulcers Psychiatry: Judgement and insight appear normal. Mood & affect appropriate.   Data Reviewed:  Labs reviewed  Family Communication: Patient does not have any family member  Disposition: Status is: Inpatient Remains inpatient appropriate because: Severity of disease, IV treatment, possibility of inpatient procedure  Planned Discharge Destination: Home with Home Health    Time spent: 35 minutes  Author: Sharen Hones, MD 12/21/2021 12:08 PM  For on call review [email or web address].

## 2021-12-21 NOTE — Evaluation (Signed)
Physical Therapy Evaluation Patient Details Name: Theresa Warren MRN: [id number] DOB: [birth year] Today's Date: 12/21/2021  History of Present Illness  Pt is a 66 y/o F admitted on 12/18/21 after presenting to the ED with c/o a fall at home on Tuesday 12/16/21 & being on the floor since. Pt is scheduled to have dialysis catheter placed as outpoatient & c/o recent abnormal vaginal bleeding; low Hgb at 7.6. Pt is being treated for anemia of CKD, ABLA 2/2 abnormal vaginal bleeding. Pt received 1 unit PRBC & OB/GYN was consulted. PMH: DM2 on insulin, CKD 4, HTN, Chronic pain syndrome, HTN  Clinical Impression  Pt seen for PT evaluation with pt agreeable to tx. Pt reports prior to admission she was independent & living alone. On this date, pt requires max assist for bed mobility despite use of hospital bed features but is able to transfer STS & step pivot with min assist +2 fade to +1 assist with RW. Pt is also able to ambulate short distances with RW & min assist. At this time pt is unsafe to return home alone & would benefit from STR upon d/c to maximize independence with functional mobility & reduce fall risk prior to return home.     Recommendations for follow up therapy are one component of a multi-disciplinary discharge planning process, led by the attending physician.  Recommendations may be updated based on patient status, additional functional criteria and insurance authorization.  Follow Up Recommendations Skilled nursing-short term rehab (<3 hours/day)    Assistance Recommended at Discharge Frequent or constant Supervision/Assistance  Patient can return home with the following  A little help with walking and/or transfers;A lot of help with bathing/dressing/bathroom;Assistance with cooking/housework;Help with stairs or ramp for entrance;Assist for transportation    Equipment Recommendations Rolling walker (2 wheels);BSC/3in1  Recommendations for Other Services       Functional  Status Assessment Patient has had a recent decline in their functional status and demonstrates the ability to make significant improvements in function in a reasonable and predictable amount of time.     Precautions / Restrictions Precautions Precautions: Fall Restrictions Weight Bearing Restrictions: No      Mobility  Bed Mobility Overal bed mobility: Needs Assistance Bed Mobility: Supine to Sit     Supine to sit: HOB elevated, Max assist     General bed mobility comments: use of bed rails, HOB eleated, cuing to move BLE to EOB with pt slightly able to do so but ultimately requires max assist for supine>sit & max cuing but poor demo of use of bed rails    Transfers Overall transfer level: Needs assistance Equipment used: Rolling walker (2 wheels) Transfers: Sit to/from Stand, Bed to chair/wheelchair/BSC Sit to Stand: Min assist   Step pivot transfers: Min assist, +2 physical assistance (min assist +2 fade to +1)       General transfer comment: cuing for safe hand placement during STS    Ambulation/Gait Ambulation/Gait assistance: Min assist Gait Distance (Feet): 7 Feet Assistive device: Rolling walker (2 wheels) Gait Pattern/deviations: Step-through pattern, Decreased step length - right, Decreased step length - left, Decreased stride length Gait velocity: decreased        Stairs            Wheelchair Mobility    Modified Rankin (Stroke Patients Only)       Balance Overall balance assessment: Needs assistance Sitting-balance support: Feet supported, Bilateral upper extremity supported Sitting balance-Leahy Scale: Fair     Standing balance support: Bilateral upper  extremity supported, During functional activity, Reliant on assistive device for balance Standing balance-Leahy Scale: Poor                               Pertinent Vitals/Pain Pain Assessment Pain Assessment: Faces Faces Pain Scale: Hurts little more Pain Location:  scratches & raw skin on buttocks Pain Descriptors / Indicators: Discomfort, Grimacing Pain Intervention(s): Monitored during session    Home Living Family/patient expects to be discharged to:: Private residence Living Arrangements: Alone   Type of Home: House Home Access: Stairs to enter Entrance Stairs-Rails: Right Entrance Stairs-Number of Steps: 4-5   Home Layout: Two level;Able to live on main level with bedroom/bathroom Home Equipment: Kasandra Knudsen - single point      Prior Function Prior Level of Function : Independent/Modified Independent;Driving             Mobility Comments: Pt reports she was independent, recently elected to purchase herself a cane, endorses 2 falls since last September.       Hand Dominance        Extremity/Trunk Assessment   Upper Extremity Assessment Upper Extremity Assessment: Generalized weakness    Lower Extremity Assessment Lower Extremity Assessment: Generalized weakness (3-/5 knee extension in sitting)       Communication   Communication: No difficulties  Cognition Arousal/Alertness: Awake/alert Behavior During Therapy: WFL for tasks assessed/performed, Flat affect Overall Cognitive Status: Within Functional Limits for tasks assessed                                          General Comments General comments (skin integrity, edema, etc.): Pt with BM on [facility]    Exercises     Assessment/Plan    PT Assessment Patient needs continued PT services  PT Problem List Decreased strength;Pain;Cardiopulmonary status limiting activity;Decreased activity tolerance;Decreased knowledge of use of DME;Decreased balance;Decreased mobility;Decreased skin integrity       PT Treatment Interventions DME instruction;Therapeutic exercise;Gait training;Balance training;Stair training;Neuromuscular re-education;Functional mobility training;Therapeutic activities;Patient/family education;Modalities    PT Goals (Current goals can be  found in the Care Plan section)  Acute Rehab PT Goals Patient Stated Goal: get better PT Goal Formulation: With patient Time For Goal Achievement: 01/04/22 Potential to Achieve Goals: Good    Frequency Min 2X/week     Co-evaluation PT/OT/SLP Co-Evaluation/Treatment: Yes Reason for Co-Treatment: For patient/therapist safety;To address functional/ADL transfers PT goals addressed during session: Mobility/safety with mobility;Balance         AM-PAC PT "6 Clicks" Mobility  Outcome Measure Help needed turning from your back to your side while in a flat bed without using bedrails?: A Lot Help needed moving from lying on your back to sitting on the side of a flat bed without using bedrails?: Total Help needed moving to and from a bed to a chair (including a wheelchair)?: A Little Help needed standing up from a chair using your arms (e.g., wheelchair or bedside chair)?: A Little Help needed to walk in hospital room?: A Little Help needed climbing 3-5 steps with a railing? : Total 6 Click Score: 13    End of Session Equipment Utilized During Treatment: Gait belt Activity Tolerance: Patient tolerated treatment well Patient left: in chair;with call bell/phone within reach Nurse Communication: Mobility status PT Visit Diagnosis: Unsteadiness on feet (R26.81);Muscle weakness (generalized) (M62.81);Difficulty in walking, not elsewhere classified (R26.2)  Time: 1132-1206 PT Time Calculation (min) (ACUTE ONLY): 34 min   Charges:   PT Evaluation $PT Eval Low Complexity: 1 Low PT Treatments $Therapeutic Activity: 8-22 mins        Lavone Nian, PT, DPT 12/21/21, 12:37 PM   Waunita Schooner 12/21/2021, 12:35 PM

## 2021-12-21 NOTE — Progress Notes (Signed)
[facility]  PROGRESS NOTE   Subjective:   Feels much better today.  Urine output has improved.  Objective:  Vital signs: Blood pressure (!) 130/59, pulse 87, temperature 97.9 F (36.6 C), temperature source Oral, resp. rate 18, height 5\' 3"  (1.6 m), weight 94.8 kg, SpO2 98 %.  Intake/Output Summary (Last 24 hours) at 12/21/2021 1305 Last data filed at 12/21/2021 1024 Gross per 24 hour  Intake 903.43 ml  Output 1100 ml  Net -196.57 ml   Filed Weights   12/19/21 0500 12/20/21 0500 12/21/21 0453  Weight: 94 kg 94.6 kg 94.8 kg     Physical Exam: General:  No acute distress  Head:  Normocephalic, atraumatic. Moist oral mucosal membranes  Eyes:  Anicteric  Neck:  Supple  Lungs:   Clear to auscultation, normal effort  Heart:  S1S2 no rubs  Abdomen:   Soft, nontender, bowel sounds present  Extremities:  peripheral edema.  Neurologic:  Awake, alert, following commands  Skin:  No lesions  Access:     Basic Metabolic Panel: Recent Labs  Lab 12/18/21 1213 12/19/21 0512 12/20/21 0503 12/21/21 0526  NA 137 137 139 140  K 5.1 4.1 3.8 3.1*  CL 107 109 112* 111  CO2 13* 13* 14* 18*  GLUCOSE 453* 312* 243* 131*  BUN 101* 106* 92* 99*  CREATININE 5.54* 5.15* 4.68* 4.27*  CALCIUM 9.7 9.1 9.0 8.8*  MG  --  1.6* 1.7 1.5*    CBC: Recent Labs  Lab 12/18/21 1213 12/19/21 0512 12/19/21 1907 12/20/21 0503 12/21/21 0526  WBC 16.6* 12.2*  --  9.7 9.3  NEUTROABS 14.7* 9.4*  --   --   --   HGB 7.6* 6.5* 7.5* 7.5* 7.4*  HCT 24.0* 20.4* 23.7* 23.1* 23.4*  MCV 99.2 97.1  --  96.3 96.3  PLT 417* 361  --  314 291     Urinalysis: No results for input(s): COLORURINE, LABSPEC, PHURINE, GLUCOSEU, HGBUR, BILIRUBINUR, KETONESUR, PROTEINUR, UROBILINOGEN, NITRITE, LEUKOCYTESUR in the last 72 hours.  Invalid input(s): APPERANCEUR    Imaging: No results found.   Medications:     sodium bicarbonate (isotonic) infusion in sterile water 50 mL/hr at 12/20/21 1606     atorvastatin  40 mg Oral Daily   heparin  5,000 Units Subcutaneous Q8H   insulin aspart  0-20 Units Subcutaneous TID WC   insulin aspart  0-5 Units Subcutaneous QHS   insulin aspart  8 Units Subcutaneous TID WC   insulin glargine-yfgn  25 Units Subcutaneous BID   losartan  50 mg Oral Daily   metoprolol tartrate  25 mg Oral BID    Assessment/ Plan:     Principal Problem:   Fall at home Active Problems:   OSA (obstructive sleep apnea)   Chronic kidney disease, stage 5 (HCC)   Chronic pain syndrome   Essential hypertension   Morbid (severe) obesity due to excess calories ([place])   DNR (do not resuscitate)/DNI(Do Not Intubate)   Type 2 diabetes mellitus with chronic kidney disease, with long-term current use of insulin (HCC)   Anemia of renal disease   Hypomagnesemia   Acute blood loss anemia   Abnormal vaginal bleeding   Uncontrolled type 2 diabetes mellitus with hyperglycemia, with long-term current use of insulin (HCC)   Pressure injury of skin   Hypokalemia    66 year old female with obesity, hypertension, diabetes, peripheral vascular disease, chronic pain syndrome, chronic kidney disease stage IV/V now admitted with history of weakness and fall at  home.  She is now found to have acute kidney injury on the top of chronic kidney disease.  She also has worsening anemia and was given a unit of PRBC.   #1: Acute kidney injury on CKD: Acute kidney injury is most likely secondary to rhabdomyolysis complicated by prerenal azotemia.  The renal indicis have improved with improved urine output. Continue IV fluid resuscitation at the present rate.  Continue to hold the  diuretics for now.   #2: Anemia: S/p 1 unit of PRBC.  Continue to monitor closely.   #3: Diabetes: Continue insulin as ordered.   #4: Hypertension: We will continue losartan and metoprolol for now.  If creatinine continues to get worse we will hold the losartan.  We can resume the diuretics if the renal indicis improved  but possibly change to loop diuretics.  #5: Hypokalemia: This is most likely secondary to the bicarbonate drip.  Supplement potassium as ordered today.   We will continue to follow closely.   LOS: 2 Lyla Son, MD [facility] 5/21/20231:05 PM

## 2021-12-22 DIAGNOSIS — D62 Acute posthemorrhagic anemia: Secondary | ICD-10-CM | POA: Diagnosis not present

## 2021-12-22 DIAGNOSIS — W19XXXA Unspecified fall, initial encounter: Secondary | ICD-10-CM | POA: Diagnosis not present

## 2021-12-22 DIAGNOSIS — N185 Chronic kidney disease, stage 5: Secondary | ICD-10-CM | POA: Diagnosis not present

## 2021-12-22 DIAGNOSIS — N189 Chronic kidney disease, unspecified: Secondary | ICD-10-CM | POA: Diagnosis not present

## 2021-12-22 LAB — BASIC METABOLIC PANEL
Anion gap: 9 (ref 5–15)
BUN: 95 mg/dL — ABNORMAL HIGH (ref 8–23)
CO2: 19 mmol/L — ABNORMAL LOW (ref 22–32)
Calcium: 8.9 mg/dL (ref 8.9–10.3)
Chloride: 110 mmol/L (ref 98–111)
Creatinine, Ser: 3.93 mg/dL — ABNORMAL HIGH (ref 0.44–1.00)
GFR, Estimated: 12 mL/min — ABNORMAL LOW (ref >60)
Glucose, Bld: 125 mg/dL — ABNORMAL HIGH (ref 70–99)
Potassium: 3.6 mmol/L (ref 3.5–5.1)
Sodium: 138 mmol/L (ref 135–145)

## 2021-12-22 LAB — CBC
HCT: 22.3 % — ABNORMAL LOW (ref 36.0–46.0)
Hemoglobin: 7.3 g/dL — ABNORMAL LOW (ref 12.0–15.0)
MCH: 31.5 pg (ref 26.0–34.0)
MCHC: 32.7 g/dL (ref 30.0–36.0)
MCV: 96.1 fL (ref 80.0–100.0)
Platelets: 304 10*3/uL (ref 150–400)
RBC: 2.32 MIL/uL — ABNORMAL LOW (ref 3.87–5.11)
RDW: 13.6 % (ref 11.5–15.5)
WBC: 10.3 10*3/uL (ref 4.0–10.5)
nRBC: 0 % (ref 0.0–0.2)

## 2021-12-22 LAB — MAGNESIUM: Magnesium: 1.7 mg/dL (ref 1.7–2.4)

## 2021-12-22 LAB — GLUCOSE, CAPILLARY
Glucose-Capillary: 151 mg/dL — ABNORMAL HIGH (ref 70–99)
Glucose-Capillary: 156 mg/dL — ABNORMAL HIGH (ref 70–99)
Glucose-Capillary: 91 mg/dL (ref 70–99)
Glucose-Capillary: 183 mg/dL — ABNORMAL HIGH (ref 70–99)
Glucose-Capillary: 217 mg/dL — ABNORMAL HIGH (ref 70–99)

## 2021-12-22 MED ORDER — MEDIHONEY WOUND/BURN DRESSING EX PSTE
1.0000 "application " | PASTE | Freq: Every day | CUTANEOUS | Status: DC
  Administered 2021-12-22: 1 via TOPICAL
  Filled 2021-12-22: qty 44

## 2021-12-22 MED ORDER — POTASSIUM CHLORIDE 20 MEQ PO PACK: 40.0000 meq | PACK | Freq: Once | ORAL | Status: DC

## 2021-12-22 MED ORDER — POTASSIUM CHLORIDE CRYS ER 20 MEQ PO TBCR
40.0000 meq | EXTENDED_RELEASE_TABLET | Freq: Once | ORAL | Status: AC
  Administered 2021-12-22: 40 meq via ORAL
  Filled 2021-12-22: qty 2

## 2021-12-22 MED ORDER — LIDOCAINE 5 % EX PTCH
1.0000 | MEDICATED_PATCH | CUTANEOUS | Status: DC
  Administered 2021-12-22: 1 via TRANSDERMAL
  Filled 2021-12-22: qty 1

## 2021-12-22 NOTE — TOC Progression Note (Signed)
Transition of Care [facility]) - Progression Note    Patient Details  Name: Theresa Warren MRN: [id number] Date of Birth: [birth year]  Transition of Care [facility]) CM/SW [place], RN Phone Number: 12/22/2021, 3:08 PM  Clinical Narrative:     Reviewed the bed offers with the patient, she chose [facility], Ins auth started and is pending  Expected Discharge Plan: [place] Barriers to Discharge: Continued Medical Work up  Expected Discharge Plan and Services Expected Discharge Plan: [place] arrangements for the past 2 months: Single Family Home                                       Social Determinants of Health (SDOH) Interventions    Readmission Risk Interventions     View : No data to display.

## 2021-12-22 NOTE — Care Management Important Message (Signed)
Important Message  Patient Details  Name: Theresa Warren MRN: [id number] Date of Birth: 04-Oct-1955   Medicare Important Message Given:  N/A - LOS <3 / Initial given by admissions     Juliann Pulse A Cintia Gleed 12/22/2021, 10:35 AM
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
## 2021-12-22 NOTE — Progress Notes (Addendum)
Physical Therapy Treatment Patient Details Name: Theresa Warren MRN: [id number] DOB: Jul 03, 1956 Today's Date: 12/22/2021   History of Present Illness Pt is a 66 y/o F admitted on 12/18/21 after presenting to the ED with c/o a fall at home on Tuesday 12/16/21 & being on the floor since. Pt is scheduled to have dialysis catheter placed as outpoatient & c/o recent abnormal vaginal bleeding; low Hgb at 7.6. Pt is being treated for anemia of CKD, ABLA 2/2 abnormal vaginal bleeding. Pt received 1 unit PRBC & OB/GYN was consulted. PMH: DM2 on insulin, CKD 4, HTN, Chronic pain syndrome, HTN    PT Comments    Pt in bed, agrees to session.  She is able to get to EOB with min a x 1. Steady in sitting.  She is able to stand to RW with min a x 1 and walk small laps in room by bed about 10' before transitioning to chair.  Reviewed HEP and encouraged to do on her own several times a day.  She does endorse some fatigue today but overall seems to have improved mobility.  SNF remains appropriate.  Pt is very talkative today and needs verbal and tactile cues to remain on task.   Recommendations for follow up therapy are one component of a multi-disciplinary discharge planning process, led by the attending physician.  Recommendations may be updated based on patient status, additional functional criteria and insurance authorization.  Follow Up Recommendations  Skilled nursing-short term rehab (<3 hours/day)     Assistance Recommended at Discharge Frequent or constant Supervision/Assistance  Patient can return home with the following A little help with walking and/or transfers;A lot of help with bathing/dressing/bathroom;Assistance with cooking/housework;Help with stairs or ramp for entrance;Assist for transportation   Equipment Recommendations  Rolling walker (2 wheels);BSC/3in1    Recommendations for Other Services       Precautions / Restrictions Precautions Precautions: Fall Restrictions Weight  Bearing Restrictions: No     Mobility  Bed Mobility Overal bed mobility: Needs Assistance Bed Mobility: Supine to Sit     Supine to sit: Min assist          Transfers Overall transfer level: Needs assistance Equipment used: Rolling walker (2 wheels) Transfers: Sit to/from Stand Sit to Stand: Min assist                Ambulation/Gait Ambulation/Gait assistance: Min assist, Min guard Gait Distance (Feet): 10 Feet Assistive device: Rolling walker (2 wheels) Gait Pattern/deviations: Step-through pattern, Decreased step length - right, Decreased step length - left, Decreased stride length Gait velocity: decreased     General Gait Details: slow cautious gait   Stairs             Wheelchair Mobility    Modified Rankin (Stroke Patients Only)       Balance Overall balance assessment: Needs assistance Sitting-balance support: No upper extremity supported, Feet supported Sitting balance-Leahy Scale: Fair     Standing balance support: Bilateral upper extremity supported Standing balance-Leahy Scale: Fair                              Cognition Arousal/Alertness: Awake/alert Behavior During Therapy: WFL for tasks assessed/performed, Flat affect Overall Cognitive Status: Within Functional Limits for tasks assessed  Exercises Other Exercises Other Exercises: marching in place x 10 with walker support    General Comments        Pertinent Vitals/Pain Pain Assessment Pain Assessment: Faces Faces Pain Scale: Hurts little more Pain Location: scratches & raw skin on buttocks Pain Descriptors / Indicators: Discomfort, Grimacing Pain Intervention(s): Limited activity within patient's tolerance, Monitored during session, Repositioned    Home Living                          Prior Function            PT Goals (current goals can now be found in the care plan section)  Progress towards PT goals: Progressing toward goals    Frequency    Min 2X/week      PT Plan Current plan remains appropriate    Co-evaluation              AM-PAC PT "6 Clicks" Mobility   Outcome Measure  Help needed turning from your back to your side while in a flat bed without using bedrails?: A Little Help needed moving from lying on your back to sitting on the side of a flat bed without using bedrails?: A Little Help needed moving to and from a bed to a chair (including a wheelchair)?: A Little Help needed standing up from a chair using your arms (e.g., wheelchair or bedside chair)?: A Little Help needed to walk in hospital room?: A Little Help needed climbing 3-5 steps with a railing? : A Lot 6 Click Score: 17    End of Session Equipment Utilized During Treatment: Gait belt Activity Tolerance: Patient tolerated treatment well Patient left: in chair;with call bell/phone within reach;with chair alarm set Nurse Communication: Mobility status PT Visit Diagnosis: Unsteadiness on feet (R26.81);Muscle weakness (generalized) (M62.81);Difficulty in walking, not elsewhere classified (R26.2)     Time: 2683-4196 PT Time Calculation (min) (ACUTE ONLY): 17 min  Charges:  $Gait Training: 8-22 mins                   Chesley Noon, PTA 12/22/21, 1:03 PM

## 2021-12-22 NOTE — TOC Progression Note (Signed)
Transition of Care [facility]) - Progression Note    Patient Details  Name: Theresa Warren MRN: [id number] Date of Birth: 11-12-1955  Transition of Care [facility]) CM/SW [place], RN Phone Number: 12/22/2021, 10:39 AM  Clinical Narrative:    Patient is agreeable to go to [facility] SNF [place] sent, Fl2 completed, PASSR obtained Will review the bed offers once obtained   Expected Discharge Plan: [place] Barriers to Discharge: Continued Medical Work up  Expected Discharge Plan and Services Expected Discharge Plan: [place] arrangements for the past 2 months: Single Family Home                                       Social Determinants of Health (SDOH) Interventions    Readmission Risk Interventions     View : No data to display.

## 2021-12-22 NOTE — Progress Notes (Signed)
  Progress Note   Patient: Theresa Warren YBO:[id number] DOB: 10-25-1955 DOA: 12/18/2021     3 DOS: the patient was seen and examined on 12/22/2021   Brief hospital course: 66 year old female history of type 2 diabetes on insulin, CKD stage V, hypertension, chronic pain syndrome, hypertension presents to the ER today after a reported fall at home.   Patient has a mild elevation of CK at 270.  Received IV fluids.  She is also scheduled to have dialysis catheter placed as outpatient. Hemoglobin was also low at 7.6, dropped down to 6.5.  Patient had recent abnormal vaginal bleeding for the last months.  Received 1 unit PRBC. Patient is placed on sodium bicarbonate drip, renal function gradually improving.  Assessment and Plan: Fall at home. Patient has been seen by PT/OT, recommend nursing home placement.  Looking for bed.  Anemia of chronic kidney disease. Acute blood loss anemia secondary to abnormal vaginal bleeding. Abnormal vaginal bleeding Anemia of chronic kidney disease. Patient does not appear to have any iron or B12 deficiency.   Patient no longer has any vaginal bleeding.  Hemoglobin is low but relatively stable.  Continue to follow, transfuse as needed.  Acute renal failure on stage V chronic kidney disease. Metabolic acidosis. Hypokalemia Hypomagnesemia. Patient renal function continues to improve, it appears that the patient had acute kidney injury superimposed on chronic kidney disease.  Continue bicarbonate drip.  Potassium 3.6 today, will give additional 40 mg oral potassium  Chronic pain syndrome. Does not complain of any pain today  Uncontrolled type 2 diabetes with hyperglycemia. Continue current regimen.   Essential hypertension. Continue metoprolol and losartan.   Morbid obesity. BMI 36.9 with advanced renal disease and hypertension.  Patient qualified for morbid obesity.      Subjective:  Patient has significant weakness, denies any short of  breath.  Physical Exam: Vitals:   12/22/21 0500 12/22/21 0509 12/22/21 0630 12/22/21 0803  BP:  (!) 151/58 135/67 (!) 136/52  Pulse:  87 84 88  Resp:  20  16  Temp:  98.4 F (36.9 C)  98.1 F (36.7 C)  TempSrc:      SpO2:  99%  100%  Weight: 97 kg     Height:       General exam: Appears calm and comfortable  Respiratory system: Clear to auscultation. Respiratory effort normal. Cardiovascular system: S1 & S2 heard, RRR. No JVD, murmurs, rubs, gallops or clicks.  Gastrointestinal system: Abdomen is nondistended, soft and nontender. No organomegaly or masses felt. Normal bowel sounds heard. Central nervous system: Alert and oriented. No focal neurological deficits. Extremities: Bilateral lower extremity nonpitting edema Skin: Skin ecchymosis. Psychiatry: Judgement and insight appear normal. Mood & affect appropriate. ' Data Reviewed:  Lab results reviewed.  Family Communication: Patient does not have immediate family members  Disposition: Status is: Inpatient Remains inpatient appropriate because: Verity of disease, IV treatment.  Planned Discharge Destination: Skilled nursing facility    Time spent: 36 minutes  Author: Sharen Hones, MD 12/22/2021 11:04 AM  For on call review [email or web address].

## 2021-12-22 NOTE — Progress Notes (Signed)
[facility]  PROGRESS NOTE   Subjective:   Patient resting in bed Appetite slightly improved, denies nausea and vomiting Remains on room air  Creatinine 3.93 Urine output 655ml overnight  Objective:  Vital signs: Blood pressure (!) 136/52, pulse 88, temperature 98.1 F (36.7 C), resp. rate 16, height 5\' 3"  (1.6 m), weight 97 kg, SpO2 100 %.  Intake/Output Summary (Last 24 hours) at 12/22/2021 1106 Last data filed at 12/22/2021 1033 Gross per 24 hour  Intake 299.58 ml  Output 650 ml  Net -350.42 ml    Filed Weights   12/20/21 0500 12/21/21 0453 12/22/21 0500  Weight: 94.6 kg 94.8 kg 97 kg     Physical Exam: General:  No acute distress  Head:  Normocephalic, atraumatic. Moist oral mucosal membranes  Eyes:  Anicteric  Lungs:   Clear to auscultation, normal effort  Heart:  S1S2 no rubs  Abdomen:   Soft, nontender, bowel sounds present  Extremities:  peripheral edema.  Neurologic:  Awake, alert, following commands  Skin:  No lesions  Access: None    Basic Metabolic Panel: Recent Labs  Lab 12/18/21 1213 12/19/21 0512 12/20/21 0503 12/21/21 0526 12/22/21 0237  NA 137 137 139 140 138  K 5.1 4.1 3.8 3.1* 3.6  CL 107 109 112* 111 110  CO2 13* 13* 14* 18* 19*  GLUCOSE 453* 312* 243* 131* 125*  BUN 101* 106* 92* 99* 95*  CREATININE 5.54* 5.15* 4.68* 4.27* 3.93*  CALCIUM 9.7 9.1 9.0 8.8* 8.9  MG  --  1.6* 1.7 1.5* 1.7     CBC: Recent Labs  Lab 12/18/21 1213 12/19/21 0512 12/19/21 1907 12/20/21 0503 12/21/21 0526 12/22/21 0237  WBC 16.6* 12.2*  --  9.7 9.3 10.3  NEUTROABS 14.7* 9.4*  --   --   --   --   HGB 7.6* 6.5* 7.5* 7.5* 7.4* 7.3*  HCT 24.0* 20.4* 23.7* 23.1* 23.4* 22.3*  MCV 99.2 97.1  --  96.3 96.3 96.1  PLT 417* 361  --  314 291 304      Urinalysis: No results for input(s): COLORURINE, LABSPEC, PHURINE, GLUCOSEU, HGBUR, BILIRUBINUR, KETONESUR, PROTEINUR, UROBILINOGEN, NITRITE, LEUKOCYTESUR in the last 72 hours.  Invalid  input(s): APPERANCEUR    Imaging: No results found.   Medications:     sodium bicarbonate (isotonic) infusion in sterile water 50 mL/hr at 12/21/21 1959    atorvastatin  40 mg Oral Daily   heparin  5,000 Units Subcutaneous Q8H   insulin aspart  0-20 Units Subcutaneous TID WC   insulin aspart  0-5 Units Subcutaneous QHS   insulin aspart  8 Units Subcutaneous TID WC   insulin glargine-yfgn  25 Units Subcutaneous BID   leptospermum manuka honey  1 application. Topical Daily   losartan  50 mg Oral Daily   metoprolol tartrate  25 mg Oral BID   potassium chloride  40 mEq Oral Once    Assessment/ Plan:     Principal Problem:   Fall at home Active Problems:   OSA (obstructive sleep apnea)   Chronic kidney disease, stage 5 (HCC)   Chronic pain syndrome   Essential hypertension   Morbid (severe) obesity due to excess calories ([place])   DNR (do not resuscitate)/DNI(Do Not Intubate)   Type 2 diabetes mellitus with chronic kidney disease, with long-term current use of insulin (HCC)   Anemia of renal disease   Hypomagnesemia   Acute blood loss anemia   Abnormal vaginal bleeding   Uncontrolled type 2  diabetes mellitus with hyperglycemia, with long-term current use of insulin (HCC)   Pressure injury of skin   Hypokalemia    66 year old female with obesity, hypertension, diabetes, peripheral vascular disease, chronic pain syndrome, chronic kidney disease stage IV/V now admitted with history of weakness and fall at home.  She is now found to have acute kidney injury on the top of chronic kidney disease.  She also has worsening anemia and was given a unit of PRBC.   #1: Acute kidney injury on Chronic kidney disease stage V: Acute kidney injury is most likely secondary to rhabdomyolysis complicated by prerenal azotemia. Baseline creatinine 3.66 on 08/26/21.  Creatinine has improved with IVFs and improved nutrition. Urine output of 640ml overnight. No acute indication for dialysis. Will  continue to monitor during admission and allow patient to follow up with outpatient nephrologist for dialysis initiation.    #2: Anemia with chronic kidney disease. Hgb 7.3, blood transfusion given during this admission.  Will continue to monitor   #3: Diabetes mellitus type II with chronic kidney disease.  Glucose elevated at times. Primary team to manage SSI.   #4: Hypertension with chronic kidney disease: Home regimen includes furosemide and valsartan-hydrochlorothiazide.  Currently taking clonidine, losartan and metoprolol.  Diuretics remain held  Blood pressure currently 136/52  #5: Hypokalemia: resolved, 3.6    LOS: 3 [facility] 5/22/202311:06 AM

## 2021-12-23 DIAGNOSIS — W19XXXA Unspecified fall, initial encounter: Secondary | ICD-10-CM | POA: Diagnosis not present

## 2021-12-23 DIAGNOSIS — N185 Chronic kidney disease, stage 5: Secondary | ICD-10-CM | POA: Diagnosis not present

## 2021-12-23 DIAGNOSIS — N939 Abnormal uterine and vaginal bleeding, unspecified: Secondary | ICD-10-CM | POA: Diagnosis not present

## 2021-12-23 DIAGNOSIS — D62 Acute posthemorrhagic anemia: Secondary | ICD-10-CM | POA: Diagnosis not present

## 2021-12-23 LAB — BASIC METABOLIC PANEL
Anion gap: 11 (ref 5–15)
BUN: 91 mg/dL — ABNORMAL HIGH (ref 8–23)
CO2: 22 mmol/L (ref 22–32)
Calcium: 8.8 mg/dL — ABNORMAL LOW (ref 8.9–10.3)
Chloride: 104 mmol/L (ref 98–111)
Creatinine, Ser: 3.91 mg/dL — ABNORMAL HIGH (ref 0.44–1.00)
GFR, Estimated: 12 mL/min — ABNORMAL LOW (ref >60)
Glucose, Bld: 98 mg/dL (ref 70–99)
Potassium: 3.8 mmol/L (ref 3.5–5.1)
Sodium: 137 mmol/L (ref 135–145)

## 2021-12-23 LAB — CBC
HCT: 23.9 % — ABNORMAL LOW (ref 36.0–46.0)
Hemoglobin: 7.6 g/dL — ABNORMAL LOW (ref 12.0–15.0)
MCH: 30.8 pg (ref 26.0–34.0)
MCHC: 31.8 g/dL (ref 30.0–36.0)
MCV: 96.8 fL (ref 80.0–100.0)
Platelets: 311 10*3/uL (ref 150–400)
RBC: 2.47 MIL/uL — ABNORMAL LOW (ref 3.87–5.11)
RDW: 13.6 % (ref 11.5–15.5)
WBC: 10.8 10*3/uL — ABNORMAL HIGH (ref 4.0–10.5)
nRBC: 0 % (ref 0.0–0.2)

## 2021-12-23 LAB — GLUCOSE, CAPILLARY
Glucose-Capillary: 116 mg/dL — ABNORMAL HIGH (ref 70–99)
Glucose-Capillary: 113 mg/dL — ABNORMAL HIGH (ref 70–99)

## 2021-12-23 LAB — MAGNESIUM: Magnesium: 1.5 mg/dL — ABNORMAL LOW (ref 1.7–2.4)

## 2021-12-23 LAB — HEPATITIS B SURFACE ANTIBODY, QUANTITATIVE: Hep B S AB Quant (Post): 46.7 m[IU]/mL (ref >9.9)

## 2021-12-23 MED ORDER — SODIUM BICARBONATE 650 MG PO TABS: 650.0000 mg | ORAL_TABLET | Freq: Two times a day (BID) | ORAL | 1 refills | Status: DC

## 2021-12-23 MED ORDER — LOSARTAN POTASSIUM 50 MG PO TABS: 50.0000 mg | ORAL_TABLET | Freq: Every day | ORAL | Status: DC

## 2021-12-23 MED ORDER — METOPROLOL TARTRATE 25 MG PO TABS: 25.0000 mg | ORAL_TABLET | Freq: Two times a day (BID) | ORAL | Status: AC

## 2021-12-23 NOTE — Progress Notes (Signed)
Physical Therapy Treatment Patient Details Name: Theresa Warren MRN: [id number] DOB: 04-29-1956 Today's Date: 12/23/2021   History of Present Illness Pt is a 66 y/o F admitted on 12/18/21 after presenting to the ED with c/o a fall at home on Tuesday 12/16/21 & being on the floor since. Pt is scheduled to have dialysis catheter placed as outpoatient & c/o recent abnormal vaginal bleeding; low Hgb at 7.6. Pt is being treated for anemia of CKD, ABLA 2/2 abnormal vaginal bleeding. Pt received 1 unit PRBC & OB/GYN was consulted. PMH: DM2 on insulin, CKD 4, HTN, Chronic pain syndrome, HTN    PT Comments    Pt in chair reporting fatigue from poor sleep last night.  Stood to [employer] with min assist and cues for hand placement.  She is able to make 2 small laps between beds in room about 30' with stop at commode to void before  transitioning back to bed with mod a x 1 for le's back onto air mattress.  Positioned for comfort.  Overall improved gait today but does seem to self limit herself.   Recommendations for follow up therapy are one component of a multi-disciplinary discharge planning process, led by the attending physician.  Recommendations may be updated based on patient status, additional functional criteria and insurance authorization.  Follow Up Recommendations  Skilled nursing-short term rehab (<3 hours/day)     Assistance Recommended at Discharge Frequent or constant Supervision/Assistance  Patient can return home with the following A little help with walking and/or transfers;A lot of help with bathing/dressing/bathroom;Assistance with cooking/housework;Help with stairs or ramp for entrance;Assist for transportation   Equipment Recommendations  Rolling walker (2 wheels);BSC/3in1    Recommendations for Other Services       Precautions / Restrictions Precautions Precautions: Fall Restrictions Weight Bearing Restrictions: No     Mobility  Bed Mobility Overal bed mobility: Needs  Assistance       Supine to sit: Mod assist     General bed mobility comments: LE;s back onto bed.  does have new airbed which is more difficult to get in/out    Transfers Overall transfer level: Needs assistance Equipment used: Rolling walker (2 wheels) Transfers: Sit to/from Stand Sit to Stand: Min assist                Ambulation/Gait Ambulation/Gait assistance: Min assist, Min guard Gait Distance (Feet): 30 Feet Assistive device: Rolling walker (2 wheels) Gait Pattern/deviations: Step-through pattern, Decreased step length - right, Decreased step length - left, Decreased stride length Gait velocity: decreased     General Gait Details: slow cautious gait   Stairs             Wheelchair Mobility    Modified Rankin (Stroke Patients Only)       Balance Overall balance assessment: Needs assistance Sitting-balance support: No upper extremity supported, Feet supported Sitting balance-Leahy Scale: Fair     Standing balance support: Bilateral upper extremity supported Standing balance-Leahy Scale: Fair                              Cognition Arousal/Alertness: Awake/alert Behavior During Therapy: WFL for tasks assessed/performed, Flat affect Overall Cognitive Status: Within Functional Limits for tasks assessed  Exercises Other Exercises Other Exercises: to commode to void.  does own self care    General Comments        Pertinent Vitals/Pain Pain Assessment Pain Assessment: Faces Pain Location: scratches & raw skin on buttocks Pain Descriptors / Indicators: Discomfort, Grimacing Pain Intervention(s): Limited activity within patient's tolerance, Monitored during session, Repositioned    Home Living                          Prior Function            PT Goals (current goals can now be found in the care plan section) Progress towards PT goals: Progressing toward  goals    Frequency    Min 2X/week      PT Plan Current plan remains appropriate    Co-evaluation              AM-PAC PT "6 Clicks" Mobility   Outcome Measure  Help needed turning from your back to your side while in a flat bed without using bedrails?: A Little Help needed moving from lying on your back to sitting on the side of a flat bed without using bedrails?: A Little Help needed moving to and from a bed to a chair (including a wheelchair)?: A Little Help needed standing up from a chair using your arms (e.g., wheelchair or bedside chair)?: A Little Help needed to walk in hospital room?: A Little Help needed climbing 3-5 steps with a railing? : A Lot 6 Click Score: 17    End of Session Equipment Utilized During Treatment: Gait belt Activity Tolerance: Patient tolerated treatment well Patient left: in chair;with call bell/phone within reach;with chair alarm set Nurse Communication: Mobility status PT Visit Diagnosis: Unsteadiness on feet (R26.81);Muscle weakness (generalized) (M62.81);Difficulty in walking, not elsewhere classified (R26.2)     Time: 4944-9675 PT Time Calculation (min) (ACUTE ONLY): 13 min  Charges:  $Gait Training: 8-22 mins                    Chesley Noon, PTA 12/23/21, 10:34 AM

## 2021-12-23 NOTE — Progress Notes (Signed)
[facility]  PROGRESS NOTE   Subjective:   Patient seen sitting in chair, half eaten breakfast at bedside States her appetite has improved Remains on room air  Creatinine 3.91  Objective:  Vital signs: Blood pressure 134/72, pulse 90, temperature 98.4 F (36.9 C), temperature source Oral, resp. rate 20, height 5\' 3"  (1.6 m), weight 97 kg, SpO2 100 %.  Intake/Output Summary (Last 24 hours) at 12/23/2021 1153 Last data filed at 12/23/2021 1042 Gross per 24 hour  Intake 2248.72 ml  Output 200 ml  Net 2048.72 ml    Filed Weights   12/20/21 0500 12/21/21 0453 12/22/21 0500  Weight: 94.6 kg 94.8 kg 97 kg     Physical Exam: General:  No acute distress  Head:  Normocephalic, atraumatic. Moist oral mucosal membranes  Eyes:  Anicteric  Lungs:   Clear to auscultation, normal effort  Heart:  S1S2 no rubs  Abdomen:   Soft, nontender, bowel sounds present  Extremities:  1+ peripheral edema.  Neurologic:  Awake, alert, following commands  Skin:  No lesions  Access: None    Basic Metabolic Panel: Recent Labs  Lab 12/19/21 0512 12/20/21 0503 12/21/21 0526 12/22/21 0237 12/23/21 0340  NA 137 139 140 138 137  K 4.1 3.8 3.1* 3.6 3.8  CL 109 112* 111 110 104  CO2 13* 14* 18* 19* 22  GLUCOSE 312* 243* 131* 125* 98  BUN 106* 92* 99* 95* 91*  CREATININE 5.15* 4.68* 4.27* 3.93* 3.91*  CALCIUM 9.1 9.0 8.8* 8.9 8.8*  MG 1.6* 1.7 1.5* 1.7 1.5*     CBC: Recent Labs  Lab 12/18/21 1213 12/19/21 0512 12/19/21 1907 12/20/21 0503 12/21/21 0526 12/22/21 0237 12/23/21 0340  WBC 16.6* 12.2*  --  9.7 9.3 10.3 10.8*  NEUTROABS 14.7* 9.4*  --   --   --   --   --   HGB 7.6* 6.5* 7.5* 7.5* 7.4* 7.3* 7.6*  HCT 24.0* 20.4* 23.7* 23.1* 23.4* 22.3* 23.9*  MCV 99.2 97.1  --  96.3 96.3 96.1 96.8  PLT 417* 361  --  314 291 304 311      Urinalysis: No results for input(s): COLORURINE, LABSPEC, PHURINE, GLUCOSEU, HGBUR, BILIRUBINUR, KETONESUR, PROTEINUR, UROBILINOGEN,  NITRITE, LEUKOCYTESUR in the last 72 hours.  Invalid input(s): APPERANCEUR    Imaging: No results found.   Medications:     sodium bicarbonate (isotonic) infusion in sterile water 50 mL/hr at 12/22/21 2110    atorvastatin  40 mg Oral Daily   heparin  5,000 Units Subcutaneous Q8H   insulin aspart  0-20 Units Subcutaneous TID WC   insulin aspart  0-5 Units Subcutaneous QHS   insulin aspart  8 Units Subcutaneous TID WC   insulin glargine-yfgn  25 Units Subcutaneous BID   leptospermum manuka honey  1 application. Topical Daily   lidocaine  1 patch Transdermal Q24H   losartan  50 mg Oral Daily   metoprolol tartrate  25 mg Oral BID    Assessment/ Plan:     Principal Problem:   Fall at home Active Problems:   OSA (obstructive sleep apnea)   Chronic kidney disease, stage 5 (HCC)   Chronic pain syndrome   Essential hypertension   Morbid (severe) obesity due to excess calories ([place])   DNR (do not resuscitate)/DNI(Do Not Intubate)   Type 2 diabetes mellitus with chronic kidney disease, with long-term current use of insulin (HCC)   Anemia of renal disease   Hypomagnesemia   Acute blood loss anemia  Abnormal vaginal bleeding   Uncontrolled type 2 diabetes mellitus with hyperglycemia, with long-term current use of insulin (HCC)   Pressure injury of skin   Hypokalemia    66 year old female with obesity, hypertension, diabetes, peripheral vascular disease, chronic pain syndrome, chronic kidney disease stage IV/V now admitted with history of weakness and fall at home.  She is now found to have acute kidney injury on the top of chronic kidney disease.  She also has worsening anemia and was given a unit of PRBC.   #1: Acute kidney injury on Chronic kidney disease stage V: Acute kidney injury is most likely secondary to rhabdomyolysis complicated by prerenal azotemia. Baseline creatinine 3.66 on 08/26/21.  Creatinine appears stable and oral nutrition improving. Patient cleared to  discharge to rehab today with close followup with [facility]    #2: Anemia with chronic kidney disease. Hgb 7.6 today, blood transfusion given during this admission.     #3: Diabetes mellitus type II with chronic kidney disease.  Glucose elevated at times. Primary team to manage SSI.   #4: Hypertension with chronic kidney disease: Home regimen includes furosemide and valsartan-hydrochlorothiazide.  Currently taking clonidine, losartan and metoprolol.  Diuretics remain held  Blood pressure stable for this patient, 134/72  #5: Hypokalemia: resolved    LOS: [facility] 5/23/202311:53 AM

## 2021-12-23 NOTE — Discharge Summary (Signed)
Physician Discharge Summary   Patient: Theresa Warren MRN: [id number] DOB: 11-28-1955  Admit date:     12/18/2021  Discharge date: 12/23/21  Discharge Physician: Sharen Hones   PCP: Crist Infante, MD   Recommendations at discharge:   Follow-up with nephrology in 2 weeks. Follow-up with PCP in 1 week. Follow-up with OB/GYN as outpatient.  I have ordered for follow-up with Dr. Marcelline Mates in 1 week Check a CBC in 1 week, transfuse with hemoglobin less than 7.  Discharge Diagnoses: Principal Problem:   Fall at home Active Problems:   Chronic kidney disease, stage 5 ([place])   Chronic pain syndrome   Essential hypertension   DNR (do not resuscitate)/DNI(Do Not Intubate)   Type 2 diabetes mellitus with chronic kidney disease, with long-term current use of insulin (HCC)   Anemia of renal disease   OSA (obstructive sleep apnea)   Morbid (severe) obesity due to excess calories (HCC)   Hypomagnesemia   Acute blood loss anemia   Abnormal vaginal bleeding   Uncontrolled type 2 diabetes mellitus with hyperglycemia, with long-term current use of insulin (HCC)   Pressure injury of skin   Hypokalemia 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 Resolved Problems:   * No resolved hospital problems. *  Hospital Course: 66 year old female history of type 2 diabetes on insulin, CKD stage V, hypertension, chronic pain syndrome, hypertension presents to the ER today after a reported fall at home.   Patient has a mild elevation of CK at 270.  Received IV fluids.  She is also scheduled to have dialysis catheter placed as outpatient. Hemoglobin was also low at 7.6, dropped down to 6.5.  Patient had recent abnormal vaginal bleeding for the last months.  Received 1 unit PRBC. Patient is placed on sodium bicarbonate drip, renal function gradually improving.  Assessment and Plan: Fall at home. Patient has been seen by PT/OT, recommend nursing home placement.  Bed  approved.  Anemia of chronic kidney disease. Acute blood loss anemia secondary to abnormal vaginal bleeding. Abnormal vaginal bleeding Anemia of chronic kidney disease. Patient does not appear to have any iron or B12 deficiency.   Patient no longer has any vaginal bleeding.  Hemoglobin is low but relatively stable.   Patient no longer has any vaginal bleeding, but will need to rule out malignancy with outpatient work-up.  Dr. Rubie Maid will follow-up on patient. Patient hemoglobin has been low but stable.  Please check another CBC in 1 week, transfuse if hemoglobin is less than 7.  Acute renal failure on stage V chronic kidney disease. Metabolic acidosis. Hypokalemia Hypomagnesemia. Renal function has plateaued at 3.9, discussed with Dr. Holley Raring, patient is medically stable to be discharged, no immediate need for dialysis.  Patient be followed by nephrology as scheduled. I will continue some oral sodium bicarbonate.  Chronic pain syndrome. Patient has not been taking pain medicine in the hospital.  Uncontrolled type 2 diabetes with hyperglycemia. Resume home regimen   Essential hypertension. Continue metoprolol and losartan.   Morbid obesity. BMI 36.9 with advanced renal disease and hypertension.  Patient qualified for morbid obesity.  Pressure ulcers: POA Follow with RN Pressure Injury 12/20/21 Buttocks Bilateral Stage 2 -  Partial thickness loss of dermis presenting as a shallow open injury with a red, pink wound bed without slough. open area right below coccyx (Active)  12/20/21 0800  Location: Buttocks  Location Orientation: Bilateral  Staging: Stage 2 -  Partial thickness loss of dermis presenting as a shallow  open injury with a red, pink wound bed without slough.  Wound Description (Comments): open area right below coccyx  Present on Admission: -- (First day with patient. Unknown if present at admission.)           Consultants: OB/GYN, nephrology Procedures  performed: None  Disposition: Skilled nursing facility Diet recommendation:  Discharge Diet Orders (From admission, onward)     Start     Ordered   12/23/21 0000  Diet - low sodium heart healthy        12/23/21 1005           Cardiac diet DISCHARGE MEDICATION: Allergies as of 12/23/2021       Reactions   Codeine Nausea And Vomiting   Sulfa Antibiotics Swelling   Clindamycin/lincomycin Rash   Penicillins Rash        Medication List     STOP taking these medications    ALPRAZolam 0.5 MG tablet Commonly known as: XANAX   Pfizer-BioNT COVID-19 Vac-TriS Susp injection Generic drug: COVID-19 mRNA Vac-TriS (Pfizer)   traMADol 50 MG tablet Commonly known as: ULTRAM   valsartan-hydrochlorothiazide 160-12.5 MG tablet Commonly known as: DIOVAN-HCT       TAKE these medications    allopurinol 100 MG tablet Commonly known as: ZYLOPRIM Take 100 mg by mouth daily.   atorvastatin 40 MG tablet Commonly known as: LIPITOR Take 40 mg by mouth daily.   fexofenadine 180 MG tablet Commonly known as: ALLEGRA Take 180 mg by mouth daily.   Fish Oil 500 MG Caps Take by mouth.   fluticasone 50 MCG/ACT nasal spray Commonly known as: Flonase Place 2 sprays into both nostrils daily.   furosemide 20 MG tablet Commonly known as: LASIX Take 20 mg by mouth daily.   HUMULIN R 500 UNIT/ML injection Generic drug: insulin regular human CONCENTRATED Inject 25-30 Units into the skin 2 (two) times daily with a meal. Patient reports she uses Humulin R U500 vials and U100 insulin syringe. Draws up to 30 unit mark on U100 syringe QAM, Humulin R U500 25 unit mark on U100 syringe QPM   losartan 50 MG tablet Commonly known as: COZAAR Take 1 tablet (50 mg total) by mouth daily. Start taking on: Dec 24, 2021   metoprolol tartrate 25 MG tablet Commonly known as: LOPRESSOR Take 1 tablet (25 mg total) by mouth 2 (two) times daily.   nystatin powder Commonly known as:  MYCOSTATIN/NYSTOP Apply topically.   sodium bicarbonate 650 MG tablet Take 1 tablet (650 mg total) by mouth 2 (two) times daily.   triamcinolone cream 0.1 % Commonly known as: KENALOG Apply 1 application. topically 3 (three) times daily.   Vitamin D (Ergocalciferol) 1.25 MG (50000 UNIT) Caps capsule Commonly known as: DRISDOL Take 1 capsule by mouth 2 (two) times a week.               Discharge Care Instructions  (From admission, onward)           Start     Ordered   12/23/21 0000  Discharge wound care:       Comments: Follow with RN   12/23/21 1005            Contact information for follow-up providers     Donato Heinz, MD Follow up in 2 week(s).   Specialty: Nephrology Contact information: [place] [address] [phone]              Contact information for after-discharge care  [facility]erred [facility] .   Service: Skilled Nursing Contact information: [place] [phone]                    Discharge Exam: Danley Danker Weights   12/20/21 0500 12/21/21 0453 12/22/21 0500  Weight: 94.6 kg 94.8 kg 97 kg   General exam: Appears calm and comfortable  Respiratory system: Clear to auscultation. Respiratory effort normal. Cardiovascular system: S1 & S2 heard, RRR. No JVD, murmurs, rubs, gallops or clicks.  Gastrointestinal system: Abdomen is nondistended, soft and nontender. No organomegaly or masses felt. Normal bowel sounds heard. Central nervous system: Alert and oriented. No focal neurological deficits. Extremities: 1+ leg edema Skin: Sacral wound  Psychiatry: Judgement and insight appear normal. Mood & affect appropriate.    Condition at discharge: good  The results of significant diagnostics from this hospitalization (including imaging, microbiology, ancillary and  laboratory) are listed below for reference.   Imaging Studies: DG Chest 2 View  Result Date: 12/18/2021 CLINICAL DATA:  Fall, weakness EXAM: CHEST - 2 VIEW COMPARISON:  None Available. FINDINGS: Patient rotation. No consolidation. No visible pleural effusions or pneumothorax. Cardiomediastinal silhouette is within normal limits when accounting for patient rotation. Mildly displaced right lateral seventh rib fracture IMPRESSION: 1. Mildly displaced right lateral seventh rib fracture, potentially acute. 2. No other acute abnormality identified. Electronically Signed   By: Margaretha Sheffield M.D.   On: 12/18/2021 13:23   DG Pelvis 1-2 Views  Result Date: 12/18/2021 CLINICAL DATA:  Fall 2 days ago EXAM: PELVIS - 1-2 VIEW COMPARISON:  None Available. FINDINGS: Patient is rotated. There is no evidence of pelvic fracture or diastasis. No pelvic bone lesions are seen. Prominent atherosclerotic vascular calcifications. IMPRESSION: Negative. Electronically Signed   By: Davina Poke D.O.   On: 12/18/2021 13:22   CT Head Wo Contrast  Result Date: 12/18/2021 CLINICAL DATA:  Head trauma EXAM: CT HEAD WITHOUT CONTRAST TECHNIQUE: Contiguous axial images were obtained from the base of the skull through the vertex without intravenous contrast. RADIATION DOSE REDUCTION: This exam was performed according to the departmental dose-optimization program which includes automated exposure control, adjustment of the mA and/or kV according to patient size and/or use of iterative reconstruction technique. COMPARISON:  None Available. FINDINGS: Brain: No acute intracranial hemorrhage, mass effect, or herniation. No extra-axial fluid collections. No evidence of acute territorial infarct. No hydrocephalus. Mild cortical volume loss. Mild patchy hypodensities in the periventricular and subcortical white matter, likely secondary to chronic microvascular ischemic changes. Vascular: Calcified plaques in the carotid siphons. Skull:  Normal. Negative for fracture or focal lesion. Sinuses/Orbits: No acute finding. Other: None. IMPRESSION: Chronic changes with no acute intracranial process identified. Electronically Signed   By: Ofilia Neas M.D.   On: 12/18/2021 13:04   CT Cervical Spine Wo Contrast  Result Date: 12/18/2021 CLINICAL DATA:  Neck trauma EXAM: CT CERVICAL SPINE WITHOUT CONTRAST TECHNIQUE: Multidetector CT imaging of the cervical spine was performed without intravenous contrast. Multiplanar CT image reconstructions were also generated. RADIATION DOSE REDUCTION: This exam was performed according to the departmental dose-optimization program which includes automated exposure control, adjustment of the mA and/or kV according to patient size and/or use of iterative reconstruction technique. COMPARISON:  None Available. FINDINGS: Alignment: Grade 1 anterolisthesis of C6 on C7. No acute subluxation. Skull base and vertebrae: No acute fracture. No primary bone lesion or focal pathologic  process. Soft tissues and spinal canal: No prevertebral fluid or swelling. No visible canal hematoma. Disc levels: Mild to moderate intervertebral disc height loss at C4-C5 and C5-C6 with accompanying endplate osteophytes and uncovertebral spurring. Bilateral mild facet arthropathy. Upper chest: No acute process identified. Other: Coarse calcified plaques noted in the right proximal ICA. IMPRESSION: No acute fracture or malalignment identified. Chronic findings as described. Electronically Signed   By: Ofilia Neas M.D.   On: 12/18/2021 13:07   US PELVIS (TRANSABDOMINAL ONLY)  Result Date: 12/19/2021 CLINICAL DATA:  Abnormal vaginal bleeding. EXAM: TRANSABDOMINAL ULTRASOUND OF PELVIS TECHNIQUE: Transabdominal ultrasound examination of the pelvis was performed including evaluation of the uterus, ovaries, adnexal regions, and pelvic cul-de-sac. Patient declined transvaginal examination. COMPARISON:  None Available. FINDINGS: Uterus  Measurements: 10.1 x 5.7 x 4.8 cm = volume: 145 mL. Shadowing echogenic structure measuring 1.5 x 1.2 x 0.9 cm in the uterine fundus. Endometrium Thickness: 6.4 mm. Heterogeneous appearance of the endometrium along the lower uterine segment. Right ovary Not visualized. Left ovary Not visualized. Other findings:  No abnormal free fluid. IMPRESSION: Evaluation is somewhat limited by patient's refusal of the transvaginal portion of the examination. Within this context: 1. Heterogeneous appearance of the endometrium along the lower uterine segment with the endometrial stripe measuring 6.4 mm, consider further evaluation with endometrial sampling versus sonohysterogram in the setting of abnormal postmenopausal uterine bleeding. 2. Shadowing echogenic structure measuring 1.5 cm in the uterine fundus, likely a calcified fibroid. 3. Bilateral ovaries are not visualized and may be atrophic or obscured by bowel gas. Electronically Signed   By: Dahlia Bailiff M.D.   On: 12/19/2021 13:17   US Venous Img Lower Unilateral Left  Result Date: 12/18/2021 CLINICAL DATA:  Unilateral leg swelling EXAM: LEFT LOWER EXTREMITY VENOUS DOPPLER ULTRASOUND TECHNIQUE: Gray-scale sonography with graded compression, as well as color Doppler and duplex ultrasound were performed to evaluate the lower extremity deep venous systems from the level of the common femoral vein and including the common femoral, femoral, profunda femoral, popliteal and calf veins including the posterior tibial, peroneal and gastrocnemius veins when visible. The superficial great saphenous vein was also interrogated. Spectral Doppler was utilized to evaluate flow at rest and with distal augmentation maneuvers in the common femoral, femoral and popliteal veins. COMPARISON:  None Available. FINDINGS: Contralateral Common Femoral Vein: Respiratory phasicity is normal and symmetric with the symptomatic side. No evidence of thrombus. Normal compressibility. Common Femoral  Vein: No evidence of thrombus. Normal compressibility, respiratory phasicity and response to augmentation. Saphenofemoral Junction: No evidence of thrombus. Normal compressibility and flow on color Doppler imaging. Profunda Femoral Vein: No evidence of thrombus. Normal compressibility and flow on color Doppler imaging. Femoral Vein: No evidence of thrombus. Normal compressibility, respiratory phasicity and response to augmentation. Popliteal Vein: No evidence of thrombus. Normal compressibility, respiratory phasicity and response to augmentation. Calf Veins: No evidence of thrombus. Normal compressibility and flow on color Doppler imaging. Superficial Great Saphenous Vein: No evidence of thrombus. Normal compressibility. Venous Reflux:  None. Other Findings:  None. IMPRESSION: No evidence of left lower extremity deep venous thrombosis. Electronically Signed   By: Davina Poke D.O.   On: 12/18/2021 14:38   VAS [place] LOWER EXTREMITY VENOUS (DVT)  Result Date: 12/05/2021  Lower Venous DVT Study Patient Name:  AZALYA GALYON Wiedeman  Date of Exam:   12/05/2021 Medical Rec #: [id number]               Accession #:    [id number] Date  of Birth: 06-01-1956               Patient Gender: F Patient Age:   24 years Exam Location:  [facility] Procedure:      VAS [place] LOWER EXTREMITY VENOUS (DVT) Referring Phys: MARK PERINI --------------------------------------------------------------------------------  Indications: Pain, and Swelling.  Limitations: Body habitus and edema. Performing Technologist: Ralene Cork RVT  Examination Guidelines: A complete evaluation includes B-mode imaging, spectral Doppler, color Doppler, and power Doppler as needed of all accessible portions of each vessel. Bilateral testing is considered an integral part of a complete examination. Limited examinations for reoccurring indications may be performed as noted. The reflux portion of the exam is performed with the patient in reverse  Trendelenburg.  +---------+---------------+---------+-----------+----------+--------------+ RIGHT    CompressibilityPhasicitySpontaneityPropertiesThrombus Aging +---------+---------------+---------+-----------+----------+--------------+ CFV      Full           Yes      Yes                                 +---------+---------------+---------+-----------+----------+--------------+ SFJ      Full                    Yes                                 +---------+---------------+---------+-----------+----------+--------------+ FV Prox  Full           Yes      Yes                                 +---------+---------------+---------+-----------+----------+--------------+ FV Mid   Full           Yes      Yes                                 +---------+---------------+---------+-----------+----------+--------------+ FV DistalFull           Yes      Yes                                 +---------+---------------+---------+-----------+----------+--------------+ POP      Full           Yes      Yes                                 +---------+---------------+---------+-----------+----------+--------------+ PTV      Full                    Yes                                 +---------+---------------+---------+-----------+----------+--------------+ PERO     Full                    Yes                                 +---------+---------------+---------+-----------+----------+--------------+ GSV      Full  Yes      Yes                                 +---------+---------------+---------+-----------+----------+--------------+  +---------+---------------+---------+-----------+----------+--------------+ LEFT     CompressibilityPhasicitySpontaneityPropertiesThrombus Aging +---------+---------------+---------+-----------+----------+--------------+ CFV      Full           Yes      Yes                                  +---------+---------------+---------+-----------+----------+--------------+ SFJ      Full                    Yes                                 +---------+---------------+---------+-----------+----------+--------------+ FV Prox  Full           Yes      Yes                                 +---------+---------------+---------+-----------+----------+--------------+ FV Mid   Full           Yes      Yes                                 +---------+---------------+---------+-----------+----------+--------------+ FV DistalFull           Yes      Yes                                 +---------+---------------+---------+-----------+----------+--------------+ POP      Full           Yes      Yes                                 +---------+---------------+---------+-----------+----------+--------------+ PTV      Full                    Yes                                 +---------+---------------+---------+-----------+----------+--------------+ GSV      Full           Yes                                          +---------+---------------+---------+-----------+----------+--------------+   Left Technical Findings: Not visualized segments include peroneal vein. Findings reported to [facility] answering service at 4:40pm. No call back from MD after 41minutes.  Summary: RIGHT: - There is no evidence of deep vein thrombosis in the lower extremity. - There is no evidence of superficial venous thrombosis. - No evidence of thrombus in the lateral thigh at the area of pain.  LEFT: - There is no evidence of deep vein thrombosis in the lower extremity. Peroneal vein was not visualized. - There is no evidence of superficial venous thrombosis.  *  See table(s) above for measurements and observations. Electronically signed by Harold Barban MD on 12/05/2021 at 8:22:23 PM.    Final     Microbiology: Results for orders placed or performed during the hospital encounter of 12/18/21  Wet prep, genital      Status: None   Collection Time: 12/19/21  2:28 PM   Specimen: Vaginal  Result Value Ref Range Status   Yeast Wet Prep HPF POC NONE SEEN NONE SEEN Final   Trich, Wet Prep NONE SEEN NONE SEEN Final   Clue Cells Wet Prep HPF POC NONE SEEN NONE SEEN Final   WBC, Wet Prep HPF POC <10 <10 Final   Sperm NONE SEEN  Final    Comment: Performed at [facility], [address] [place] [address]    Labs: CBC: Recent Labs  Lab 12/18/21 1213 12/19/21 0512 12/19/21 1907 12/20/21 0503 12/21/21 0526 12/22/21 0237 12/23/21 0340  WBC 16.6* 12.2*  --  9.7 9.3 10.3 10.8*  NEUTROABS 14.7* 9.4*  --   --   --   --   --   HGB 7.6* 6.5* 7.5* 7.5* 7.4* 7.3* 7.6*  HCT 24.0* 20.4* 23.7* 23.1* 23.4* 22.3* 23.9*  MCV 99.2 97.1  --  96.3 96.3 96.1 96.8  PLT 417* 361  --  314 291 304 604   Basic Metabolic Panel: Recent Labs  Lab 12/19/21 0512 12/20/21 0503 12/21/21 0526 12/22/21 0237 12/23/21 0340  NA 137 139 140 138 137  K 4.1 3.8 3.1* 3.6 3.8  CL 109 112* 111 110 104  CO2 13* 14* 18* 19* 22  GLUCOSE 312* 243* 131* 125* 98  BUN 106* 92* 99* 95* 91*  CREATININE 5.15* 4.68* 4.27* 3.93* 3.91*  CALCIUM 9.1 9.0 8.8* 8.9 8.8*  MG 1.6* 1.7 1.5* 1.7 1.5*   Liver Function Tests: Recent Labs  Lab 12/18/21 1213 12/19/21 0512  AST 42* 144*  ALT 35 71*  ALKPHOS 157* 149*  BILITOT 0.8 0.6  PROT 7.2 6.1*  ALBUMIN 2.7* 2.3*   CBG: Recent Labs  Lab 12/22/21 0748 12/22/21 1207 12/22/21 1628 12/22/21 2051 12/23/21 0755  GLUCAP 156* 91 183* 217* 116*    Discharge time spent: greater than 30 minutes.  Signed: Sharen Hones, MD [facility] 12/23/2021

## 2021-12-23 NOTE — TOC Progression Note (Signed)
Transition of Care [facility]) - Progression Note    Patient Details  Name: Theresa Warren MRN: [id number] Date of Birth: [birth year]  Transition of Care [facility]) CM/SW [place], RN Phone Number: 12/23/2021, 10:28 AM  Clinical Narrative:     Patient going to [facility] called, she will notify family and friends  Expected Discharge Plan: [place] Barriers to Discharge: Continued Medical Work up  Expected Discharge Plan and Services Expected Discharge Plan: [place] arrangements for the past 2 months: Single Family Home Expected Discharge Date: 12/23/21                                     Social Determinants of Health (SDOH) Interventions    Readmission Risk Interventions     View : No data to display.

## 2021-12-23 NOTE — Care Management Important Message (Signed)
Important Message  Patient Details  Name: Theresa Warren MRN: [id number] Date of Birth: [birth year]   Medicare Important Message Given:  Yes     Theresa Warren 12/23/2021, 10:31 AM

## 2021-12-23 NOTE — TOC Progression Note (Signed)
Transition of Care [facility]) - Progression Note    Patient Details  Name: Theresa Warren MRN: [id number] Date of Birth: 1955/10/04  Transition of Care [facility]) CM/SW [place], RN Phone Number: 12/23/2021, 9:51 AM  Clinical Narrative:     Insurance approval received ref number [id number] Next review date 5/25  Expected Discharge Plan: [place] Barriers to Discharge: Continued Medical Work up  Expected Discharge Plan and Services Expected Discharge Plan: [place] arrangements for the past 2 months: Single Family Home                                       Social Determinants of Health (SDOH) Interventions    Readmission Risk Interventions     View : No data to display.

## 2021-12-31 ENCOUNTER — Inpatient Hospital Stay
Admission: EM | Admit: 2021-12-31 | Discharge: 2022-02-10 | DRG: 853 | Disposition: A | Discharge status: Skilled Nursing Facility | Payer: Medicare Other | Attending: Hospitalist | Admitting: Hospitalist

## 2021-12-31 ENCOUNTER — Observation Stay: Payer: Medicare Other

## 2021-12-31 ENCOUNTER — Encounter: Payer: Self-pay | Admitting: Internal Medicine

## 2021-12-31 DIAGNOSIS — N185 Chronic kidney disease, stage 5: Secondary | ICD-10-CM | POA: Diagnosis present

## 2021-12-31 DIAGNOSIS — R509 Fever, unspecified: Secondary | ICD-10-CM

## 2021-12-31 DIAGNOSIS — Z820 Family history of epilepsy and other diseases of the nervous system: Secondary | ICD-10-CM

## 2021-12-31 DIAGNOSIS — N950 Postmenopausal bleeding: Secondary | ICD-10-CM | POA: Diagnosis present

## 2021-12-31 DIAGNOSIS — A419 Sepsis, unspecified organism: Secondary | ICD-10-CM | POA: Diagnosis not present

## 2021-12-31 DIAGNOSIS — A4101 Sepsis due to Methicillin susceptible Staphylococcus aureus: Secondary | ICD-10-CM | POA: Diagnosis not present

## 2021-12-31 DIAGNOSIS — N179 Acute kidney failure, unspecified: Secondary | ICD-10-CM | POA: Diagnosis present

## 2021-12-31 DIAGNOSIS — L89323 Pressure ulcer of left buttock, stage 3: Secondary | ICD-10-CM | POA: Diagnosis present

## 2021-12-31 DIAGNOSIS — L89224 Pressure ulcer of left hip, stage 4: Secondary | ICD-10-CM | POA: Diagnosis present

## 2021-12-31 DIAGNOSIS — N186 End stage renal disease: Secondary | ICD-10-CM | POA: Diagnosis present

## 2021-12-31 DIAGNOSIS — N049 Nephrotic syndrome with unspecified morphologic changes: Secondary | ICD-10-CM

## 2021-12-31 DIAGNOSIS — Z79899 Other long term (current) drug therapy: Secondary | ICD-10-CM

## 2021-12-31 DIAGNOSIS — G4733 Obstructive sleep apnea (adult) (pediatric): Secondary | ICD-10-CM | POA: Diagnosis present

## 2021-12-31 DIAGNOSIS — L89213 Pressure ulcer of right hip, stage 3: Secondary | ICD-10-CM | POA: Diagnosis present

## 2021-12-31 DIAGNOSIS — E1152 Type 2 diabetes mellitus with diabetic peripheral angiopathy with gangrene: Secondary | ICD-10-CM | POA: Diagnosis present

## 2021-12-31 DIAGNOSIS — R6520 Severe sepsis without septic shock: Secondary | ICD-10-CM | POA: Diagnosis not present

## 2021-12-31 DIAGNOSIS — R7989 Other specified abnormal findings of blood chemistry: Secondary | ICD-10-CM | POA: Diagnosis present

## 2021-12-31 DIAGNOSIS — D631 Anemia in chronic kidney disease: Secondary | ICD-10-CM | POA: Diagnosis present

## 2021-12-31 DIAGNOSIS — N2581 Secondary hyperparathyroidism of renal origin: Secondary | ICD-10-CM | POA: Diagnosis present

## 2021-12-31 DIAGNOSIS — Z833 Family history of diabetes mellitus: Secondary | ICD-10-CM

## 2021-12-31 DIAGNOSIS — E8721 Acute metabolic acidosis: Secondary | ICD-10-CM | POA: Diagnosis present

## 2021-12-31 DIAGNOSIS — I10 Essential (primary) hypertension: Secondary | ICD-10-CM | POA: Diagnosis present

## 2021-12-31 DIAGNOSIS — Z7401 Bed confinement status: Secondary | ICD-10-CM

## 2021-12-31 DIAGNOSIS — M7989 Other specified soft tissue disorders: Secondary | ICD-10-CM | POA: Diagnosis present

## 2021-12-31 DIAGNOSIS — I491 Atrial premature depolarization: Secondary | ICD-10-CM | POA: Diagnosis present

## 2021-12-31 DIAGNOSIS — I132 Hypertensive heart and chronic kidney disease with heart failure and with stage 5 chronic kidney disease, or end stage renal disease: Secondary | ICD-10-CM | POA: Diagnosis present

## 2021-12-31 DIAGNOSIS — I4891 Unspecified atrial fibrillation: Secondary | ICD-10-CM | POA: Diagnosis not present

## 2021-12-31 DIAGNOSIS — Z801 Family history of malignant neoplasm of trachea, bronchus and lung: Secondary | ICD-10-CM

## 2021-12-31 DIAGNOSIS — G894 Chronic pain syndrome: Secondary | ICD-10-CM | POA: Diagnosis present

## 2021-12-31 DIAGNOSIS — Z992 Dependence on renal dialysis: Secondary | ICD-10-CM

## 2021-12-31 DIAGNOSIS — S31000A Unspecified open wound of lower back and pelvis without penetration into retroperitoneum, initial encounter: Secondary | ICD-10-CM | POA: Diagnosis present

## 2021-12-31 DIAGNOSIS — Z6841 Body Mass Index (BMI) 40.0 and over, adult: Secondary | ICD-10-CM

## 2021-12-31 DIAGNOSIS — D500 Iron deficiency anemia secondary to blood loss (chronic): Secondary | ICD-10-CM | POA: Diagnosis present

## 2021-12-31 DIAGNOSIS — Z66 Do not resuscitate: Secondary | ICD-10-CM | POA: Diagnosis present

## 2021-12-31 DIAGNOSIS — L89314 Pressure ulcer of right buttock, stage 4: Secondary | ICD-10-CM | POA: Diagnosis present

## 2021-12-31 DIAGNOSIS — B3731 Acute candidiasis of vulva and vagina: Secondary | ICD-10-CM | POA: Diagnosis present

## 2021-12-31 DIAGNOSIS — I5032 Chronic diastolic (congestive) heart failure: Secondary | ICD-10-CM | POA: Diagnosis present

## 2021-12-31 DIAGNOSIS — E11649 Type 2 diabetes mellitus with hypoglycemia without coma: Secondary | ICD-10-CM | POA: Diagnosis not present

## 2021-12-31 DIAGNOSIS — D649 Anemia, unspecified: Principal | ICD-10-CM | POA: Diagnosis present

## 2021-12-31 DIAGNOSIS — Z794 Long term (current) use of insulin: Secondary | ICD-10-CM

## 2021-12-31 DIAGNOSIS — E871 Hypo-osmolality and hyponatremia: Secondary | ICD-10-CM | POA: Diagnosis present

## 2021-12-31 DIAGNOSIS — E785 Hyperlipidemia, unspecified: Secondary | ICD-10-CM | POA: Diagnosis present

## 2021-12-31 DIAGNOSIS — E1165 Type 2 diabetes mellitus with hyperglycemia: Secondary | ICD-10-CM | POA: Diagnosis present

## 2021-12-31 DIAGNOSIS — N189 Chronic kidney disease, unspecified: Secondary | ICD-10-CM | POA: Diagnosis not present

## 2021-12-31 DIAGNOSIS — L89154 Pressure ulcer of sacral region, stage 4: Secondary | ICD-10-CM | POA: Diagnosis present

## 2021-12-31 DIAGNOSIS — Z8249 Family history of ischemic heart disease and other diseases of the circulatory system: Secondary | ICD-10-CM

## 2021-12-31 DIAGNOSIS — L8990 Pressure ulcer of unspecified site, unspecified stage: Secondary | ICD-10-CM | POA: Diagnosis present

## 2021-12-31 DIAGNOSIS — R109 Unspecified abdominal pain: Secondary | ICD-10-CM | POA: Diagnosis present

## 2021-12-31 DIAGNOSIS — E1122 Type 2 diabetes mellitus with diabetic chronic kidney disease: Secondary | ICD-10-CM | POA: Diagnosis present

## 2021-12-31 LAB — COMPREHENSIVE METABOLIC PANEL
ALT: 116 U/L — ABNORMAL HIGH (ref 0–44)
AST: 192 U/L — ABNORMAL HIGH (ref 15–41)
Albumin: 2.3 g/dL — ABNORMAL LOW (ref 3.5–5.0)
Alkaline Phosphatase: 229 U/L — ABNORMAL HIGH (ref 38–126)
Anion gap: 13 (ref 5–15)
BUN: 100 mg/dL — ABNORMAL HIGH (ref 8–23)
CO2: 17 mmol/L — ABNORMAL LOW (ref 22–32)
Calcium: 8.3 mg/dL — ABNORMAL LOW (ref 8.9–10.3)
Chloride: 99 mmol/L (ref 98–111)
Creatinine, Ser: 4.61 mg/dL — ABNORMAL HIGH (ref 0.44–1.00)
GFR, Estimated: 10 mL/min — ABNORMAL LOW (ref >60)
Glucose, Bld: 181 mg/dL — ABNORMAL HIGH (ref 70–99)
Potassium: 5.1 mmol/L (ref 3.5–5.1)
Sodium: 129 mmol/L — ABNORMAL LOW (ref 135–145)
Total Bilirubin: 0.5 mg/dL (ref 0.3–1.2)
Total Protein: 7 g/dL (ref 6.5–8.1)

## 2021-12-31 LAB — CBC
HCT: 22.1 % — ABNORMAL LOW (ref 36.0–46.0)
Hemoglobin: 6.9 g/dL — ABNORMAL LOW (ref 12.0–15.0)
MCH: 30.7 pg (ref 26.0–34.0)
MCHC: 31.2 g/dL (ref 30.0–36.0)
MCV: 98.2 fL (ref 80.0–100.0)
Platelets: 473 10*3/uL — ABNORMAL HIGH (ref 150–400)
RBC: 2.25 MIL/uL — ABNORMAL LOW (ref 3.87–5.11)
RDW: 13.5 % (ref 11.5–15.5)
WBC: 18.4 10*3/uL — ABNORMAL HIGH (ref 4.0–10.5)
nRBC: 0 % (ref 0.0–0.2)

## 2021-12-31 MED ORDER — ACETAMINOPHEN 325 MG PO TABS
650.0000 mg | ORAL_TABLET | Freq: Four times a day (QID) | ORAL | Status: AC | PRN
  Administered 2022-01-02: 650 mg via ORAL
  Filled 2021-12-31: qty 2

## 2021-12-31 MED ORDER — LACTATED RINGERS IV BOLUS
1000.0000 mL | Freq: Once | INTRAVENOUS | Status: AC
Start: 2021-12-31 — End: 2022-01-01
  Administered 2022-01-01: 1000 mL via INTRAVENOUS

## 2021-12-31 MED ORDER — INSULIN ASPART 100 UNIT/ML IJ SOLN
0.0000 [IU] | Freq: Every day | INTRAMUSCULAR | Status: DC
  Administered 2022-01-03 – 2022-01-05 (×2): 2 [IU] via SUBCUTANEOUS
  Administered 2022-01-06: 3 [IU] via SUBCUTANEOUS
  Administered 2022-01-12: 2 [IU] via SUBCUTANEOUS
  Administered 2022-01-13 – 2022-01-14 (×2): 3 [IU] via SUBCUTANEOUS
  Administered 2022-01-15 – 2022-01-17 (×2): 4 [IU] via SUBCUTANEOUS
  Administered 2022-01-18 – 2022-01-24 (×3): 2 [IU] via SUBCUTANEOUS
  Administered 2022-01-27: 3 [IU] via SUBCUTANEOUS
  Administered 2022-02-03 – 2022-02-04 (×2): 2 [IU] via SUBCUTANEOUS
  Filled 2021-12-31 (×14): qty 1

## 2021-12-31 MED ORDER — SODIUM CHLORIDE 0.9 % IV SOLN: INTRAVENOUS | Status: AC

## 2021-12-31 MED ORDER — SODIUM CHLORIDE 0.9 % IV SOLN
10.0000 mL/h | Freq: Once | INTRAVENOUS | Status: AC
  Administered 2022-01-01: 10 mL/h via INTRAVENOUS

## 2021-12-31 MED ORDER — METRONIDAZOLE 500 MG/100ML IV SOLN
500.0000 mg | Freq: Once | INTRAVENOUS | Status: AC
  Administered 2022-01-01: 500 mg via INTRAVENOUS
  Filled 2021-12-31: qty 100

## 2021-12-31 MED ORDER — VANCOMYCIN HCL IN DEXTROSE 1-5 GM/200ML-% IV SOLN
1000.0000 mg | Freq: Once | INTRAVENOUS | Status: AC
  Administered 2022-01-01: 1000 mg via INTRAVENOUS
  Filled 2021-12-31: qty 200

## 2021-12-31 MED ORDER — ONDANSETRON HCL 4 MG/2ML IJ SOLN
4.0000 mg | Freq: Four times a day (QID) | INTRAMUSCULAR | Status: DC | PRN
  Administered 2022-01-04: 4 mg via INTRAVENOUS
  Filled 2021-12-31: qty 2

## 2021-12-31 MED ORDER — VANCOMYCIN HCL IN DEXTROSE 1-5 GM/200ML-% IV SOLN: 1000.0000 mg | Freq: Once | INTRAVENOUS | Status: DC

## 2021-12-31 MED ORDER — SODIUM CHLORIDE 0.9 % IV SOLN
2.0000 g | Freq: Once | INTRAVENOUS | Status: AC
  Administered 2022-01-01: 2 g via INTRAVENOUS
  Filled 2021-12-31: qty 12.5

## 2021-12-31 MED ORDER — INSULIN ASPART 100 UNIT/ML IJ SOLN
0.0000 [IU] | Freq: Three times a day (TID) | INTRAMUSCULAR | Status: DC
  Administered 2022-01-01: 2 [IU] via SUBCUTANEOUS
  Administered 2022-01-01: 3 [IU] via SUBCUTANEOUS
  Administered 2022-01-03 – 2022-01-04 (×2): 2 [IU] via SUBCUTANEOUS
  Administered 2022-01-04 – 2022-01-05 (×3): 3 [IU] via SUBCUTANEOUS
  Administered 2022-01-05: 5 [IU] via SUBCUTANEOUS
  Administered 2022-01-05: 2 [IU] via SUBCUTANEOUS
  Administered 2022-01-06: 5 [IU] via SUBCUTANEOUS
  Administered 2022-01-06: 2 [IU] via SUBCUTANEOUS
  Administered 2022-01-06: 7 [IU] via SUBCUTANEOUS
  Administered 2022-01-07: 5 [IU] via SUBCUTANEOUS
  Administered 2022-01-07: 2 [IU] via SUBCUTANEOUS
  Administered 2022-01-07: 3 [IU] via SUBCUTANEOUS
  Administered 2022-01-08 (×2): 1 [IU] via SUBCUTANEOUS
  Administered 2022-01-10: 2 [IU] via SUBCUTANEOUS
  Administered 2022-01-12 – 2022-01-13 (×2): 3 [IU] via SUBCUTANEOUS
  Administered 2022-01-13: 5 [IU] via SUBCUTANEOUS
  Administered 2022-01-13: 3 [IU] via SUBCUTANEOUS
  Administered 2022-01-14 – 2022-01-15 (×3): 2 [IU] via SUBCUTANEOUS
  Administered 2022-01-15: 5 [IU] via SUBCUTANEOUS
  Administered 2022-01-15 – 2022-01-16 (×2): 2 [IU] via SUBCUTANEOUS
  Administered 2022-01-16: 3 [IU] via SUBCUTANEOUS
  Administered 2022-01-17: 7 [IU] via SUBCUTANEOUS
  Administered 2022-01-17: 2 [IU] via SUBCUTANEOUS
  Administered 2022-01-17 – 2022-01-18 (×2): 5 [IU] via SUBCUTANEOUS
  Administered 2022-01-18: 3 [IU] via SUBCUTANEOUS
  Administered 2022-01-18: 7 [IU] via SUBCUTANEOUS
  Administered 2022-01-19: 2 [IU] via SUBCUTANEOUS
  Administered 2022-01-19: 3 [IU] via SUBCUTANEOUS
  Administered 2022-01-20: 5 [IU] via SUBCUTANEOUS
  Administered 2022-01-20 (×2): 3 [IU] via SUBCUTANEOUS
  Administered 2022-01-21: 5 [IU] via SUBCUTANEOUS
  Administered 2022-01-21 – 2022-01-22 (×2): 3 [IU] via SUBCUTANEOUS
  Administered 2022-01-22: 1 [IU] via SUBCUTANEOUS
  Administered 2022-01-22: 3 [IU] via SUBCUTANEOUS
  Administered 2022-01-23: 2 [IU] via SUBCUTANEOUS
  Administered 2022-01-23: 1 [IU] via SUBCUTANEOUS
  Administered 2022-01-24: 3 [IU] via SUBCUTANEOUS
  Administered 2022-01-24 – 2022-01-25 (×4): 2 [IU] via SUBCUTANEOUS
  Administered 2022-01-26: 1 [IU] via SUBCUTANEOUS
  Administered 2022-01-26: 2 [IU] via SUBCUTANEOUS
  Administered 2022-01-27: 3 [IU] via SUBCUTANEOUS
  Administered 2022-01-27: 5 [IU] via SUBCUTANEOUS
  Administered 2022-01-27: 1 [IU] via SUBCUTANEOUS
  Administered 2022-01-28 – 2022-01-29 (×4): 2 [IU] via SUBCUTANEOUS
  Administered 2022-01-29: 1 [IU] via SUBCUTANEOUS
  Administered 2022-01-29 – 2022-01-30 (×2): 2 [IU] via SUBCUTANEOUS
  Administered 2022-01-31 (×2): 1 [IU] via SUBCUTANEOUS
  Administered 2022-02-01: 5 [IU] via SUBCUTANEOUS
  Administered 2022-02-01: 2 [IU] via SUBCUTANEOUS
  Administered 2022-02-02: 1 [IU] via SUBCUTANEOUS
  Administered 2022-02-03: 2 [IU] via SUBCUTANEOUS
  Administered 2022-02-03: 3 [IU] via SUBCUTANEOUS
  Administered 2022-02-04: 5 [IU] via SUBCUTANEOUS
  Administered 2022-02-05: 3 [IU] via SUBCUTANEOUS
  Administered 2022-02-05 – 2022-02-08 (×4): 2 [IU] via SUBCUTANEOUS
  Administered 2022-02-09: 1 [IU] via SUBCUTANEOUS
  Filled 2021-12-31 (×70): qty 1

## 2021-12-31 MED ORDER — ACETAMINOPHEN 650 MG RE SUPP: 650.0000 mg | Freq: Four times a day (QID) | RECTAL | Status: AC | PRN

## 2021-12-31 MED ORDER — HYDRALAZINE HCL 20 MG/ML IJ SOLN: 5.0000 mg | Freq: Four times a day (QID) | INTRAMUSCULAR | Status: AC | PRN

## 2021-12-31 MED ORDER — ONDANSETRON HCL 4 MG PO TABS: 4.0000 mg | ORAL_TABLET | Freq: Four times a day (QID) | ORAL | Status: DC | PRN

## 2021-12-31 NOTE — Hospital Course (Addendum)
[name] is a 66 year old female hyperlipidemia, hypertension, CKD stage V, insulin-dependent diabetes mellitus, who presents emergency department from [facility] for evaluation of low sodium noted on labs at the facility.  She was discharged to rehab on 5/23 after being admitted following a fall and also with acute on chronic anemia.  ED Course - temp 99.6, HR 107, otherwise unremarkable vitals. Labs with sodium 129, potassium 5.1, chloride 99, bicarb 17, BUN of 100, serum creatinine of 4.61, GFR of 10, WBC 18.4, hemoglobin 6.9, platelets of 473.  LFT's were also elevated.  Patient met criteria for sepsis on arrival, treated per protocol in the ED with IV Cefepime, Flagyl, Vanc, and LR 1 L bolus fluids.  Infection source unclear but felt possibly due to sacral wound infection and concern for underlying osteomyelitis.  Admitted to the hospitalist service for further evaluation and management.  Continued on IV Cefepime and Vanc.   General surgery was consulted for debridement on sacral pressure injury.  Nephrology consulted due to worsening renal function.   Weeks summary from prior provider. Patient was lethargic and displayed uremic symptoms.  She was started on hemodialysis after tunneled dialysis catheter was placed by vascular surgery on 6/9.  First dialysis was started on 6/9.  In the evening she was febrile and has been spiking fevers for the past few days.  She was placed on IV meropenem broad-spectrum and ID was consulted.  Also she has left thigh induration wounds soft tissue ultrasound was negative and she was also negative for bilateral DVTs.  6/14: 2 separate wound cultures, 1 with MSSA and Enterococcus faecalis and second with MSSA and Staph epidermidis resistant to methicillin.  Antibiotics were switched to meropenem.  ID is on board.  Patient had multiple wound debridements. 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 Autoimmune and vasculitis labs  pending.  CK low at 10, acute hepatitis panel negative. Patient also has postmenopausal vaginal bleeding resulted in blood loss anemia, endometrial thickening on scan which will required outpatient gynecologic evaluation to rule out malignancy.  So far received 2 unit of PRBC.  6/15: Her wound continued to get worse.  Skin biopsy for concern of calciphylaxis was done by general surgery on 01/14/2022.  New pictures in ID note. ANA comprehensive profile was negative.  Rest of the autoimmune labs are pending.  6/16: ANCA profile negative.  Antiphospholipid antibody and skin biopsy is still pending. CBG elevated, increased Semglee to twice daily and added mealtime coverage. Preliminary skin biopsy reports with no calciphylaxis, some microthrombosis, slight sent to [facility] for dermatology department review. Multiple deep wounds and necrosis.  See today's surgery progress note for new pictures.  6/17: Patient antiphospholipid syndrome profile with negative anticardiolipin IgG and IgM but positive PTT lupus anticoagulant and DRVVT, on further characterization DRVVT mix was slightly elevated with normal DRVVT confirm.  Final skin biopsy results still pending.  6/18: CBG remained elevated, ongoing adjustment to her insulin.  Had a curbside consult with different rheumatologist and according to her recommendations she was suggesting stopping heparin for couple of days and repeat PTT lupus anticoagulant.  Patient has very nonspecific testing and does not make the cutoff for antiphospholipid syndrome.  6/19: Per nursing staff patient refusing daily dressing change and keep delaying which interferes with their workflow.  Holding heparin and we can repeat PTT lupus anticoagulant tomorrow before restarting heparin for DVT prophylaxis.  Final skin biopsy results are still pending Continues to have significant pain-palliative care was consulted  for pain management. Pulm nephrology if she cannot sit in a dialysis chair,  they might not be able to offer dialysis for a long time.  6/20: Palliative care was consulted yesterday for pain management, apparently patient was not using her as needed meds appropriately and they suggested giving a few doses daily.  Lupus anticoagulant was repeated as patient was off the heparin for 2 days.  Received second dose of sodium thiosulfate during dialysis yesterday.  Skin biopsy still pending. ID stopped antibiotics and observing now without them.

## 2021-12-31 NOTE — ED Triage Notes (Signed)
Pt is here from [facility] by ems, pt states that she is there for additional rehab pt had labwork repeated yesterday and reported that her sodium was at 109. Ot here for a recheck, pt is also getting treatment for a wound that she developed while lying in the floor for 2 days prior to her last ER visit. Pt was seen today by her wound dr who debrided her wound, cleaned and packed it, no culture performed of the wound but he told the pt he was concerned that it was Klebsiella and wanted it cultured, pt has swelling to he lower legs and feet bilat

## 2021-12-31 NOTE — ED Notes (Signed)
Pt repositioned in the recliner with two person assist. Pt inquiring about wait and was informed. Call light within reach. Pt has no further needs at this time.

## 2021-12-31 NOTE — ED Provider Notes (Signed)
[facility] Provider Note    Event Date/Time   First MD Initiated Contact with Patient 12/31/21 2219     (approximate)   History   abnormal labs   HPI  Theresa Warren is a 66 y.o. female  who, per discharge summary dated 12/23/21 had admission after a fall and found to have anemia secondary to vaginal bleeding, pressure injury, AKI, who presents to the emergency department today from rehab because of concern for low sodium level. The patient does have history of chronic renal disease, states she has noticed less urination than she would have expected. Also says she was on a fluid restricted diet. The patient did have a slight elevation of her temperature to 99 today which is higher than her baseline. Is also being treated for a skin wound.   Physical Exam   Triage Vital Signs: ED Triage Vitals  Enc Vitals Group     BP 12/31/21 1823 (!) 142/66     Pulse Rate 12/31/21 1823 (!) 107     Resp 12/31/21 1823 18     Temp 12/31/21 1823 99.6 F (37.6 C)     Temp Source 12/31/21 1823 Oral     SpO2 12/31/21 1823 99 %     Weight 12/31/21 1824 213 lb 13.5 oz (97 kg)     Height 12/31/21 1824 5\' 3"  (1.6 m)     Head Circumference --      Peak Flow --      Pain Score 12/31/21 1823 10   Most recent vital signs: Vitals:   12/31/21 1823  BP: (!) 142/66  Pulse: (!) 107  Resp: 18  Temp: 99.6 F (37.6 C)  SpO2: 99%    General: Awake, alert, oriented. CV:  Good peripheral perfusion. Tachycardia. Resp:  Normal effort. Lungs clear. Abd:  No distention. Non tender.   ED Results / Procedures / Treatments   Labs (all labs ordered are listed, but only abnormal results are displayed) Labs Reviewed  CBC - Abnormal; Notable for the following components:      Result Value   WBC 18.4 (*)    RBC 2.25 (*)    Hemoglobin 6.9 (*)    HCT 22.1 (*)    Platelets 473 (*)    All other components within normal limits  COMPREHENSIVE METABOLIC PANEL - Abnormal;  Notable for the following components:   Sodium 129 (*)    CO2 17 (*)    Glucose, Bld 181 (*)    BUN 100 (*)    Creatinine, Ser 4.61 (*)    Calcium 8.3 (*)    Albumin 2.3 (*)    AST 192 (*)    ALT 116 (*)    Alkaline Phosphatase 229 (*)    GFR, Estimated 10 (*)    All other components within normal limits     EKG  None   RADIOLOGY None   PROCEDURES:  Critical Care performed: No  Procedures   MEDICATIONS ORDERED IN ED: Medications - No data to display   IMPRESSION / MDM / [place] / ED COURSE  I reviewed the triage vital signs and the nursing notes.                              Differential diagnosis includes, but is not limited to, hyponatremia, dehydration, infection.  Patient's presentation is most consistent with acute presentation with potential threat to life or bodily function.  Patient presented to the emergency department today from rehab facility because of concerns for abnormal blood work.  Report for low sodium and sodium was found to be low here.  Additionally some AKI.  I do have concerns for dehydration.  Furthermore blood work shows leukocytosis.  Patient does have a wound.  Given leukocytosis can have concerns for infection so patient was started on antibiotics.  Additionally patient was found to be anemic.  Did have this problem during recent admission.  Will give patient blood transfusion. Discussed with Dr. Tobie Poet with the hospitalist service who will plan on admission.  FINAL CLINICAL IMPRESSION(S) / ED DIAGNOSES   Final diagnoses:  Anemia, unspecified type  AKI (acute kidney injury) ([place])  Hyponatremia        Note:  This document was prepared using Dragon voice recognition software and may include unintentional dictation errors.    Nance Pear, MD 12/31/21 [phone]

## 2021-12-31 NOTE — Assessment & Plan Note (Addendum)
POA as evidenced by tachycardia, leukocytosis, AKI and transaminitis consistent with organ dysfunction and severe sepsis with suspected infection source being sacral wounds versus an abdominal source (CT abdomen pelvis negative for anything acute however).  MRSA PCR negative. Wound cultures grew few Staph aureus, rare Enterococcus faecalis. Initially treated with vancomycin/cefepime. Vanc stopped 6/4.  --Transition to p.o. Keflex to complete course, and then transition to meropenem as she became febrile again. -Repeat wound cultures with MSSA and staph epi which was resistant to methicillin. 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 -Skin biopsy was requested for general surgery -Sodium thiosulfate was given during dialysis today for concern of calciphylaxis -Continue with meropenem -Continue with wound care

## 2021-12-31 NOTE — ED Notes (Signed)
Pt assisted to BR, stand-by assist only; voids, qs; returns to recliner in subwait; positioned for comfort & updated on wait time; sandwich tray and diet sprite given to pt at request

## 2021-12-31 NOTE — Assessment & Plan Note (Addendum)
Blood pressure mildly elevated. -Continue with Lasix, metoprolol and hydralazine

## 2021-12-31 NOTE — H&P (Signed)
History and Physical   Saraann Enneking VEH:[id number] DOB: Dec 29, 1955 DOA: 12/31/2021  PCP: Crist Infante, MD (Confirm with patient/family/NH records and if not entered, this has to be entered at [facility] point of entry) Outpatient Specialists: *** ([place] speciality and name if known) Patient coming from: ***  I have personally briefly reviewed patient's old medical records in Campus.  Chief Concern: ***  HPI: [name] is a 66 year old female hyperlipidemia, hypertension, insulin-dependent diabetes mellitus, who presents emergency department from [facility] for chief concerns of abnormal serum sodium levels on labs.  Initial vitals in the emergency department show temperature of 99.6, respiration rate of 18, heart rate of 107, blood pressure 142/66, SPO2 of 99% on room air.  Serum sodium is 129, potassium 5.1, chloride 99, bicarb 17, BUN of 100, serum creatinine of 4.61, GFR of 10, nonfasting blood glucose 181, WBC 18.4, hemoglobin 6.9, platelets of 473.  Alk phos was elevated to 29.  AST was 192.  ALT 116.  ED treatment: Cefepime, metronidazole, vancomycin, LR 1 L bolus.    Social history: ***  Vaccination history: ***  (The initial 2-3 lines should be focused and good to copy and paste in the HPI section of the daily progress note).   (For level 3, the HPI must include 4+ descriptors: Location, Quality, Severity, Duration, Timing, Context, modifying factors, associated signs/symptoms and/or status of 3+ chronic problems.)   ROS:*** Constitutional: no weight change, no fever ENT/Mouth: no sore throat, no rhinorrhea Eyes: no eye pain, no vision changes Cardiovascular: no chest pain, no dyspnea,  no edema, no palpitations Respiratory: no cough, no sputum, no wheezing Gastrointestinal: no nausea, no vomiting, no diarrhea, no constipation Genitourinary: no urinary incontinence, no dysuria, no hematuria Musculoskeletal: no arthralgias, no  myalgias Skin: no skin lesions, no pruritus, Neuro: + weakness, no loss of consciousness, no syncope Psych: no anxiety, no depression, + decrease appetite Heme/Lymph: no bruising, no bleeding  ED Course: ***  Assessment/Plan  Principal Problem:   Symptomatic anemia Active Problems:   Essential hypertension   DNR (do not resuscitate)/DNI(Do Not Intubate)   Type 2 diabetes mellitus with chronic kidney disease, with long-term current use of insulin (HCC)   OSA (obstructive sleep apnea)   Elevated LFTs   Severe sepsis (HCC)    Assessment and Plan: Essential hypertension - Holding home losartan due to AKI  Severe sepsis (HCC) - Patient meets criteria for severe sepsis with increased heart rate, leukocytosis of 18.4, source of sacral wound, and organ involvement is renal and hepatic - Check MRSA PCR - Pending first lactic acid - Status post LR 1 L bolus per EDP - IVF maintenance with sodium chloride 125 mL/h as patient is maintaining appropriate MAP - Continue with vancomycin and cefepime - Admit to telemetry medical, observation      *** Chart reviewed.   DVT prophylaxis: ***  Code Status: ***  Diet: *** Family Communication: ***  Disposition Plan: ***  Consults called: ***  Admission status: ***   Past Medical History:  Diagnosis Date   CKD (chronic kidney disease), stage III ([place])    Diabetes ([place])    Hyperlipidemia    Hypertension    Obesity    Past Surgical History:  Procedure Laterality Date   ACHILLES TENDON REPAIR     ANKLE SURGERY     URETHRAL STRICTURE DILATATION     Social History:  reports that she has never smoked. She does not have any smokeless tobacco history on file.  She reports that she does not currently use alcohol. She reports that she does not currently use drugs.  Allergies  Allergen Reactions   Codeine Nausea And Vomiting   Sulfa Antibiotics Swelling   Clindamycin/Lincomycin Rash   Penicillins Rash   Family History  Problem  Relation Age of Onset   Alzheimer's disease Father    Diabetes Father    CAD Father 57   CAD Other        Multliple maternal aunts and uncles with early onset heart disease   Lung cancer Sister    ALS Mother    Family history: Family history reviewed and not pertinent***  Prior to Admission medications   Medication Sig Start Date End Date Taking? Authorizing Provider  allopurinol (ZYLOPRIM) 100 MG tablet Take 100 mg by mouth daily.    [provider]  atorvastatin (LIPITOR) 40 MG tablet Take 40 mg by mouth daily.    [provider]  fexofenadine (ALLEGRA) 180 MG tablet Take 180 mg by mouth daily.    [provider]  fluticasone (FLONASE) 50 MCG/ACT nasal spray Place 2 sprays into both nostrils daily. 11/27/14 12/19/21  Vilinda Boehringer, MD  furosemide (LASIX) 20 MG tablet Take 20 mg by mouth daily.    [provider]  HUMULIN R 500 UNIT/ML SOLN injection Inject 25-30 Units into the skin 2 (two) times daily with a meal. Patient reports she uses Humulin R U500 vials and U100 insulin syringe. Draws up to 30 unit mark on U100 syringe QAM, Humulin R U500 25 unit mark on U100 syringe QPM 07/10/14   [provider]  losartan (COZAAR) 50 MG tablet Take 1 tablet (50 mg total) by mouth daily. 12/24/21   Sharen Hones, MD  metoprolol tartrate (LOPRESSOR) 25 MG tablet Take 1 tablet (25 mg total) by mouth 2 (two) times daily. 12/23/21   Sharen Hones, MD  nystatin (MYCOSTATIN/NYSTOP) powder Apply topically. 10/28/21   [provider]  Omega-3 Fatty Acids (FISH OIL) 500 MG CAPS Take by mouth.    [provider]  sodium bicarbonate 650 MG tablet Take 1 tablet (650 mg total) by mouth 2 (two) times daily. 12/23/21 12/23/22  Sharen Hones, MD  triamcinolone cream (KENALOG) 0.1 % Apply 1 application. topically 3 (three) times daily. 11/12/21   [provider]  Vitamin D, Ergocalciferol, (DRISDOL) 50000 UNITS CAPS capsule Take 1 capsule by mouth 2 (two)  times a week. 07/22/14   [provider]   Physical Exam: Vitals:   12/31/21 1823 12/31/21 1824  BP: (!) 142/66   Pulse: (!) 107   Resp: 18   Temp: 99.6 F (37.6 C)   TempSrc: Oral   SpO2: 99%   Weight:  97 kg  Height:  5' 3"  (1.6 m)   Constitutional: appears ***, NAD, calm, comfortable Eyes: PERRL, lids and conjunctivae normal ENMT: Mucous membranes are moist. Posterior pharynx clear of any exudate or lesions. Age-appropriate dentition. Hearing appropriate/loss*** Neck: normal, supple, no masses, no thyromegaly Respiratory: clear to auscultation bilaterally, no wheezing, no crackles. Normal respiratory effort. No accessory muscle use.  Cardiovascular: Regular rate and rhythm, no murmurs / rubs / gallops. No extremity edema. 2+ pedal pulses. No carotid bruits.  Abdomen: no tenderness, no masses palpated, no hepatosplenomegaly. Bowel sounds positive.  Musculoskeletal: no clubbing / cyanosis. No joint deformity upper and lower extremities. Good ROM, no contractures, no atrophy. Normal muscle tone.  Skin: no rashes, lesions, ulcers. No induration Neurologic: Sensation intact. Strength 5/5 in all 4.  Psychiatric: Normal judgment and insight. Alert and oriented x 3. Normal mood.   EKG: independently reviewed, showing ***  Chest x-ray on Admission: I personally reviewed and I agree*** with radiologist reading as below.  No results found.  Labs on Admission: I have personally reviewed following labs  CBC: Recent Labs  Lab 12/31/21 1826  WBC 18.4*  HGB 6.9*  HCT 22.1*  MCV 98.2  PLT 072*   Basic Metabolic Panel: Recent Labs  Lab 12/31/21 1826  NA 129*  K 5.1  CL 99  CO2 17*  GLUCOSE 181*  BUN 100*  CREATININE 4.61*  CALCIUM 8.3*   GFR: Estimated Creatinine Clearance: 13.7 mL/min (A) (by C-G formula based on SCr of 4.61 mg/dL (H)).  Liver Function Tests: Recent Labs  Lab 12/31/21 1826  AST 192*  ALT 116*  ALKPHOS 229*  BILITOT 0.5  PROT 7.0   ALBUMIN 2.3*   Urine analysis:    Component Value Date/Time   COLORURINE YELLOW (A) 12/18/2021 1213   APPEARANCEUR TURBID (A) 12/18/2021 1213   LABSPEC 1.013 12/18/2021 1213   PHURINE 5.0 12/18/2021 1213   GLUCOSEU >=500 (A) 12/18/2021 1213   HGBUR LARGE (A) 12/18/2021 1213   BILIRUBINUR NEGATIVE 12/18/2021 1213   KETONESUR 5 (A) 12/18/2021 1213   PROTEINUR 100 (A) 12/18/2021 1213   NITRITE NEGATIVE 12/18/2021 1213   LEUKOCYTESUR LARGE (A) 12/18/2021 1213   Dr. Tobie Poet [facility]  If 7PM-7AM, please contact overnight-coverage provider If 7AM-7PM, please contact day coverage provider [email or web address]  12/31/2021, 11:48 PM

## 2022-01-01 ENCOUNTER — Encounter
Admission: EM | Disposition: A | Discharge status: Skilled Nursing Facility | Payer: Self-pay | Source: Home / Self Care | Attending: Internal Medicine

## 2022-01-01 ENCOUNTER — Observation Stay: Payer: Medicare Other

## 2022-01-01 ENCOUNTER — Encounter: Payer: Self-pay | Admitting: Internal Medicine

## 2022-01-01 ENCOUNTER — Inpatient Hospital Stay: Payer: Medicare Other

## 2022-01-01 ENCOUNTER — Other Ambulatory Visit: Payer: Self-pay

## 2022-01-01 ENCOUNTER — Inpatient Hospital Stay: Payer: Medicare Other | Admitting: Anesthesiology

## 2022-01-01 DIAGNOSIS — L89314 Pressure ulcer of right buttock, stage 4: Secondary | ICD-10-CM | POA: Diagnosis present

## 2022-01-01 DIAGNOSIS — A419 Sepsis, unspecified organism: Secondary | ICD-10-CM | POA: Diagnosis not present

## 2022-01-01 DIAGNOSIS — D631 Anemia in chronic kidney disease: Secondary | ICD-10-CM | POA: Diagnosis present

## 2022-01-01 DIAGNOSIS — N189 Chronic kidney disease, unspecified: Secondary | ICD-10-CM | POA: Diagnosis not present

## 2022-01-01 DIAGNOSIS — R21 Rash and other nonspecific skin eruption: Secondary | ICD-10-CM | POA: Diagnosis not present

## 2022-01-01 DIAGNOSIS — Z6841 Body Mass Index (BMI) 40.0 and over, adult: Secondary | ICD-10-CM | POA: Diagnosis not present

## 2022-01-01 DIAGNOSIS — E871 Hypo-osmolality and hyponatremia: Secondary | ICD-10-CM | POA: Diagnosis present

## 2022-01-01 DIAGNOSIS — L89320 Pressure ulcer of left buttock, unstageable: Secondary | ICD-10-CM

## 2022-01-01 DIAGNOSIS — I132 Hypertensive heart and chronic kidney disease with heart failure and with stage 5 chronic kidney disease, or end stage renal disease: Secondary | ICD-10-CM | POA: Diagnosis present

## 2022-01-01 DIAGNOSIS — L89213 Pressure ulcer of right hip, stage 3: Secondary | ICD-10-CM | POA: Diagnosis present

## 2022-01-01 DIAGNOSIS — M7989 Other specified soft tissue disorders: Secondary | ICD-10-CM | POA: Diagnosis present

## 2022-01-01 DIAGNOSIS — T07XXXA Unspecified multiple injuries, initial encounter: Secondary | ICD-10-CM | POA: Diagnosis not present

## 2022-01-01 DIAGNOSIS — E1122 Type 2 diabetes mellitus with diabetic chronic kidney disease: Secondary | ICD-10-CM | POA: Diagnosis present

## 2022-01-01 DIAGNOSIS — L89323 Pressure ulcer of left buttock, stage 3: Secondary | ICD-10-CM

## 2022-01-01 DIAGNOSIS — Z515 Encounter for palliative care: Secondary | ICD-10-CM | POA: Diagnosis not present

## 2022-01-01 DIAGNOSIS — L89224 Pressure ulcer of left hip, stage 4: Secondary | ICD-10-CM | POA: Diagnosis present

## 2022-01-01 DIAGNOSIS — L89310 Pressure ulcer of right buttock, unstageable: Secondary | ICD-10-CM | POA: Diagnosis not present

## 2022-01-01 DIAGNOSIS — N179 Acute kidney failure, unspecified: Secondary | ICD-10-CM | POA: Diagnosis present

## 2022-01-01 DIAGNOSIS — D500 Iron deficiency anemia secondary to blood loss (chronic): Secondary | ICD-10-CM | POA: Diagnosis present

## 2022-01-01 DIAGNOSIS — L989 Disorder of the skin and subcutaneous tissue, unspecified: Secondary | ICD-10-CM | POA: Diagnosis not present

## 2022-01-01 DIAGNOSIS — Z66 Do not resuscitate: Secondary | ICD-10-CM | POA: Diagnosis present

## 2022-01-01 DIAGNOSIS — S31000A Unspecified open wound of lower back and pelvis without penetration into retroperitoneum, initial encounter: Secondary | ICD-10-CM | POA: Diagnosis present

## 2022-01-01 DIAGNOSIS — L89309 Pressure ulcer of unspecified buttock, unspecified stage: Secondary | ICD-10-CM | POA: Diagnosis not present

## 2022-01-01 DIAGNOSIS — I10 Essential (primary) hypertension: Secondary | ICD-10-CM | POA: Diagnosis not present

## 2022-01-01 DIAGNOSIS — R109 Unspecified abdominal pain: Secondary | ICD-10-CM | POA: Diagnosis present

## 2022-01-01 DIAGNOSIS — I5032 Chronic diastolic (congestive) heart failure: Secondary | ICD-10-CM | POA: Diagnosis present

## 2022-01-01 DIAGNOSIS — E8721 Acute metabolic acidosis: Secondary | ICD-10-CM | POA: Diagnosis present

## 2022-01-01 DIAGNOSIS — L89313 Pressure ulcer of right buttock, stage 3: Secondary | ICD-10-CM | POA: Diagnosis not present

## 2022-01-01 DIAGNOSIS — E1152 Type 2 diabetes mellitus with diabetic peripheral angiopathy with gangrene: Secondary | ICD-10-CM | POA: Diagnosis present

## 2022-01-01 DIAGNOSIS — Z7189 Other specified counseling: Secondary | ICD-10-CM | POA: Diagnosis not present

## 2022-01-01 DIAGNOSIS — R52 Pain, unspecified: Secondary | ICD-10-CM | POA: Diagnosis not present

## 2022-01-01 DIAGNOSIS — S31809A Unspecified open wound of unspecified buttock, initial encounter: Secondary | ICD-10-CM | POA: Diagnosis not present

## 2022-01-01 DIAGNOSIS — Z992 Dependence on renal dialysis: Secondary | ICD-10-CM | POA: Diagnosis not present

## 2022-01-01 DIAGNOSIS — N186 End stage renal disease: Secondary | ICD-10-CM | POA: Diagnosis present

## 2022-01-01 DIAGNOSIS — R6520 Severe sepsis without septic shock: Secondary | ICD-10-CM | POA: Diagnosis not present

## 2022-01-01 DIAGNOSIS — R509 Fever, unspecified: Secondary | ICD-10-CM | POA: Diagnosis not present

## 2022-01-01 DIAGNOSIS — E11649 Type 2 diabetes mellitus with hypoglycemia without coma: Secondary | ICD-10-CM | POA: Diagnosis not present

## 2022-01-01 DIAGNOSIS — L89154 Pressure ulcer of sacral region, stage 4: Secondary | ICD-10-CM | POA: Diagnosis present

## 2022-01-01 DIAGNOSIS — R5082 Postprocedural fever: Secondary | ICD-10-CM | POA: Diagnosis not present

## 2022-01-01 DIAGNOSIS — L270 Generalized skin eruption due to drugs and medicaments taken internally: Secondary | ICD-10-CM | POA: Diagnosis not present

## 2022-01-01 DIAGNOSIS — A4101 Sepsis due to Methicillin susceptible Staphylococcus aureus: Secondary | ICD-10-CM | POA: Diagnosis present

## 2022-01-01 DIAGNOSIS — I4891 Unspecified atrial fibrillation: Secondary | ICD-10-CM | POA: Diagnosis not present

## 2022-01-01 DIAGNOSIS — D649 Anemia, unspecified: Secondary | ICD-10-CM | POA: Diagnosis not present

## 2022-01-01 DIAGNOSIS — R600 Localized edema: Secondary | ICD-10-CM | POA: Diagnosis not present

## 2022-01-01 DIAGNOSIS — N2581 Secondary hyperparathyroidism of renal origin: Secondary | ICD-10-CM | POA: Diagnosis present

## 2022-01-01 HISTORY — PX: INCISION AND DRAINAGE ABSCESS: SHX5864

## 2022-01-01 LAB — CBC WITH DIFFERENTIAL/PLATELET
Abs Immature Granulocytes: 0.17 10*3/uL — ABNORMAL HIGH (ref 0.00–0.07)
Basophils Absolute: 0 10*3/uL (ref 0.0–0.1)
Basophils Relative: 0 %
Eosinophils Absolute: 0.1 10*3/uL (ref 0.0–0.5)
Eosinophils Relative: 0 %
HCT: 20.5 % — ABNORMAL LOW (ref 36.0–46.0)
Hemoglobin: 6.6 g/dL — ABNORMAL LOW (ref 12.0–15.0)
Immature Granulocytes: 1 %
Lymphocytes Relative: 10 %
Lymphs Abs: 1.4 10*3/uL (ref 0.7–4.0)
MCH: 30.4 pg (ref 26.0–34.0)
MCHC: 32.2 g/dL (ref 30.0–36.0)
MCV: 94.5 fL (ref 80.0–100.0)
Monocytes Absolute: 1.1 10*3/uL — ABNORMAL HIGH (ref 0.1–1.0)
Monocytes Relative: 8 %
Neutro Abs: 11.1 10*3/uL — ABNORMAL HIGH (ref 1.7–7.7)
Neutrophils Relative %: 81 %
Platelets: 335 10*3/uL (ref 150–400)
RBC: 2.17 MIL/uL — ABNORMAL LOW (ref 3.87–5.11)
RDW: 13.5 % (ref 11.5–15.5)
WBC: 13.8 10*3/uL — ABNORMAL HIGH (ref 4.0–10.5)
nRBC: 0 % (ref 0.0–0.2)

## 2022-01-01 LAB — BASIC METABOLIC PANEL
Anion gap: 10 (ref 5–15)
Anion gap: 13 (ref 5–15)
BUN: 98 mg/dL — ABNORMAL HIGH (ref 8–23)
BUN: 95 mg/dL — ABNORMAL HIGH (ref 8–23)
CO2: 17 mmol/L — ABNORMAL LOW (ref 22–32)
CO2: 17 mmol/L — ABNORMAL LOW (ref 22–32)
Calcium: 8 mg/dL — ABNORMAL LOW (ref 8.9–10.3)
Calcium: 8.1 mg/dL — ABNORMAL LOW (ref 8.9–10.3)
Chloride: 105 mmol/L (ref 98–111)
Chloride: 103 mmol/L (ref 98–111)
Creatinine, Ser: 4.77 mg/dL — ABNORMAL HIGH (ref 0.44–1.00)
Creatinine, Ser: 4.48 mg/dL — ABNORMAL HIGH (ref 0.44–1.00)
GFR, Estimated: 10 mL/min — ABNORMAL LOW (ref >60)
GFR, Estimated: 10 mL/min — ABNORMAL LOW (ref >60)
Glucose, Bld: 243 mg/dL — ABNORMAL HIGH (ref 70–99)
Glucose, Bld: 168 mg/dL — ABNORMAL HIGH (ref 70–99)
Potassium: 5.1 mmol/L (ref 3.5–5.1)
Potassium: 4.5 mmol/L (ref 3.5–5.1)
Sodium: 132 mmol/L — ABNORMAL LOW (ref 135–145)
Sodium: 133 mmol/L — ABNORMAL LOW (ref 135–145)

## 2022-01-01 LAB — HEPATIC FUNCTION PANEL
ALT: 107 U/L — ABNORMAL HIGH (ref 0–44)
AST: 182 U/L — ABNORMAL HIGH (ref 15–41)
Albumin: 2.1 g/dL — ABNORMAL LOW (ref 3.5–5.0)
Alkaline Phosphatase: 212 U/L — ABNORMAL HIGH (ref 38–126)
Bilirubin, Direct: 0.1 mg/dL (ref 0.0–0.2)
Indirect Bilirubin: 0.6 mg/dL (ref 0.3–0.9)
Total Bilirubin: 0.7 mg/dL (ref 0.3–1.2)
Total Protein: 6.3 g/dL — ABNORMAL LOW (ref 6.5–8.1)

## 2022-01-01 LAB — POCT I-STAT, CHEM 8
BUN: 88 mg/dL — ABNORMAL HIGH (ref 8–23)
Calcium, Ion: 1.11 mmol/L — ABNORMAL LOW (ref 1.15–1.40)
Chloride: 103 mmol/L (ref 98–111)
Creatinine, Ser: 4.8 mg/dL — ABNORMAL HIGH (ref 0.44–1.00)
Glucose, Bld: 157 mg/dL — ABNORMAL HIGH (ref 70–99)
HCT: 25 % — ABNORMAL LOW (ref 36.0–46.0)
Hemoglobin: 8.5 g/dL — ABNORMAL LOW (ref 12.0–15.0)
Potassium: 4.6 mmol/L (ref 3.5–5.1)
Sodium: 133 mmol/L — ABNORMAL LOW (ref 135–145)
TCO2: 17 mmol/L — ABNORMAL LOW (ref 22–32)

## 2022-01-01 LAB — MRSA NEXT GEN BY PCR, NASAL: MRSA by PCR Next Gen: NOT DETECTED

## 2022-01-01 LAB — LACTIC ACID, PLASMA
Lactic Acid, Venous: 1 mmol/L (ref 0.5–1.9)
Lactic Acid, Venous: 1.6 mmol/L (ref 0.5–1.9)
Lactic Acid, Venous: 2.7 mmol/L (ref 0.5–1.9)

## 2022-01-01 LAB — PROTIME-INR
INR: 1.4 — ABNORMAL HIGH (ref 0.8–1.2)
Prothrombin Time: 16.5 seconds — ABNORMAL HIGH (ref 11.4–15.2)

## 2022-01-01 LAB — GLUCOSE, CAPILLARY
Glucose-Capillary: 207 mg/dL — ABNORMAL HIGH (ref 70–99)
Glucose-Capillary: 176 mg/dL — ABNORMAL HIGH (ref 70–99)
Glucose-Capillary: 162 mg/dL — ABNORMAL HIGH (ref 70–99)
Glucose-Capillary: 164 mg/dL — ABNORMAL HIGH (ref 70–99)
Glucose-Capillary: 152 mg/dL — ABNORMAL HIGH (ref 70–99)

## 2022-01-01 LAB — CBC
HCT: 19.9 % — ABNORMAL LOW (ref 36.0–46.0)
Hemoglobin: 6.3 g/dL — ABNORMAL LOW (ref 12.0–15.0)
MCH: 30.9 pg (ref 26.0–34.0)
MCHC: 31.7 g/dL (ref 30.0–36.0)
MCV: 97.5 fL (ref 80.0–100.0)
Platelets: 410 10*3/uL — ABNORMAL HIGH (ref 150–400)
RBC: 2.04 MIL/uL — ABNORMAL LOW (ref 3.87–5.11)
RDW: 13.4 % (ref 11.5–15.5)
WBC: 17.6 10*3/uL — ABNORMAL HIGH (ref 4.0–10.5)
nRBC: 0 % (ref 0.0–0.2)

## 2022-01-01 LAB — PREPARE RBC (CROSSMATCH)

## 2022-01-01 LAB — MAGNESIUM
Magnesium: 1 mg/dL — ABNORMAL LOW (ref 1.7–2.4)
Magnesium: 2 mg/dL (ref 1.7–2.4)

## 2022-01-01 LAB — APTT: aPTT: 52 seconds — ABNORMAL HIGH (ref 24–36)

## 2022-01-01 SURGERY — INCISION AND DRAINAGE, ABSCESS
Anesthesia: General | Site: Buttocks | Laterality: Bilateral

## 2022-01-01 MED ORDER — NEOSTIGMINE METHYLSULFATE 10 MG/10ML IV SOLN
INTRAVENOUS | Status: DC | PRN
  Administered 2022-01-01: 1 mg via INTRAVENOUS

## 2022-01-01 MED ORDER — FENTANYL CITRATE (PF) 100 MCG/2ML IJ SOLN
INTRAMUSCULAR | Status: AC
  Administered 2022-01-01: 25 ug via INTRAVENOUS
  Filled 2022-01-01: qty 2

## 2022-01-01 MED ORDER — DROPERIDOL 2.5 MG/ML IJ SOLN: 0.6250 mg | Freq: Once | INTRAMUSCULAR | Status: DC | PRN

## 2022-01-01 MED ORDER — LORATADINE 10 MG PO TABS
10.0000 mg | ORAL_TABLET | Freq: Every day | ORAL | Status: DC
  Administered 2022-01-01 – 2022-02-10 (×40): 10 mg via ORAL
  Filled 2022-01-01 (×40): qty 1

## 2022-01-01 MED ORDER — PROPOFOL 10 MG/ML IV BOLUS
INTRAVENOUS | Status: AC
  Filled 2022-01-01: qty 20

## 2022-01-01 MED ORDER — LIDOCAINE HCL (PF) 2 % IJ SOLN
INTRAMUSCULAR | Status: AC
  Filled 2022-01-01: qty 5

## 2022-01-01 MED ORDER — ENSURE MAX PROTEIN PO LIQD
11.0000 [oz_av] | Freq: Every day | ORAL | Status: DC
  Administered 2022-01-02 – 2022-01-08 (×7): 11 [oz_av] via ORAL
  Filled 2022-01-01: qty 330

## 2022-01-01 MED ORDER — FENTANYL CITRATE (PF) 100 MCG/2ML IJ SOLN
INTRAMUSCULAR | Status: AC
  Filled 2022-01-01: qty 2

## 2022-01-01 MED ORDER — MIDAZOLAM HCL 2 MG/2ML IJ SOLN
INTRAMUSCULAR | Status: AC
  Filled 2022-01-01: qty 2

## 2022-01-01 MED ORDER — SODIUM BICARBONATE 650 MG PO TABS
650.0000 mg | ORAL_TABLET | Freq: Two times a day (BID) | ORAL | Status: DC
Start: 2022-01-01 — End: 2022-01-02
  Administered 2022-01-01 – 2022-01-02 (×3): 650 mg via ORAL
  Filled 2022-01-01 (×3): qty 1

## 2022-01-01 MED ORDER — VANCOMYCIN HCL 500 MG/100ML IV SOLN
500.0000 mg | INTRAVENOUS | Status: DC
  Administered 2022-01-01 – 2022-01-04 (×3): 500 mg via INTRAVENOUS
  Filled 2022-01-01 (×3): qty 100

## 2022-01-01 MED ORDER — PROMETHAZINE HCL 25 MG/ML IJ SOLN: 6.2500 mg | INTRAMUSCULAR | Status: DC | PRN

## 2022-01-01 MED ORDER — LACTATED RINGERS IV SOLN: INTRAVENOUS | Status: DC | PRN

## 2022-01-01 MED ORDER — ROCURONIUM BROMIDE 100 MG/10ML IV SOLN
INTRAVENOUS | Status: DC | PRN
  Administered 2022-01-01: 10 mg via INTRAVENOUS

## 2022-01-01 MED ORDER — ADULT MULTIVITAMIN W/MINERALS CH
1.0000 | ORAL_TABLET | Freq: Every day | ORAL | Status: DC
  Administered 2022-01-02 – 2022-01-08 (×7): 1 via ORAL
  Filled 2022-01-01 (×7): qty 1

## 2022-01-01 MED ORDER — MORPHINE SULFATE (PF) 2 MG/ML IV SOLN
1.0000 mg | INTRAVENOUS | Status: AC | PRN
  Administered 2022-01-01 – 2022-01-02 (×3): 1 mg via INTRAVENOUS
  Filled 2022-01-01 (×3): qty 1

## 2022-01-01 MED ORDER — INSULIN GLARGINE-YFGN 100 UNIT/ML ~~LOC~~ SOLN
20.0000 [IU] | Freq: Two times a day (BID) | SUBCUTANEOUS | Status: DC
  Administered 2022-01-01 – 2022-01-06 (×10): 20 [IU] via SUBCUTANEOUS
  Filled 2022-01-01 (×11): qty 0.2

## 2022-01-01 MED ORDER — PROPOFOL 10 MG/ML IV BOLUS
INTRAVENOUS | Status: DC | PRN
  Administered 2022-01-01: 40 mg via INTRAVENOUS
  Administered 2022-01-01: 70 mg via INTRAVENOUS

## 2022-01-01 MED ORDER — ALBUMIN HUMAN 5 % IV SOLN
INTRAVENOUS | Status: AC
  Filled 2022-01-01: qty 500

## 2022-01-01 MED ORDER — SODIUM CHLORIDE FLUSH 0.9 % IV SOLN
INTRAVENOUS | Status: AC
  Filled 2022-01-01: qty 10

## 2022-01-01 MED ORDER — ACETAMINOPHEN 10 MG/ML IV SOLN
INTRAVENOUS | Status: AC
  Filled 2022-01-01: qty 100

## 2022-01-01 MED ORDER — CALCIUM CHLORIDE 10 % IV SOLN
INTRAVENOUS | Status: AC
  Filled 2022-01-01: qty 10

## 2022-01-01 MED ORDER — LIDOCAINE HCL (CARDIAC) PF 100 MG/5ML IV SOSY
PREFILLED_SYRINGE | INTRAVENOUS | Status: DC | PRN
  Administered 2022-01-01: 100 mg via INTRAVENOUS

## 2022-01-01 MED ORDER — ACETAMINOPHEN 10 MG/ML IV SOLN
INTRAVENOUS | Status: DC | PRN
  Administered 2022-01-01: 650 mg via INTRAVENOUS

## 2022-01-01 MED ORDER — HYDRALAZINE HCL 50 MG PO TABS: 25.0000 mg | ORAL_TABLET | Freq: Four times a day (QID) | ORAL | Status: DC | PRN

## 2022-01-01 MED ORDER — ROCURONIUM BROMIDE 10 MG/ML (PF) SYRINGE
PREFILLED_SYRINGE | INTRAVENOUS | Status: AC
  Filled 2022-01-01: qty 10

## 2022-01-01 MED ORDER — GLYCOPYRROLATE 0.2 MG/ML IJ SOLN
INTRAMUSCULAR | Status: DC | PRN
  Administered 2022-01-01: .2 mg via INTRAVENOUS

## 2022-01-01 MED ORDER — MAGNESIUM SULFATE 4 GM/100ML IV SOLN
4.0000 g | Freq: Once | INTRAVENOUS | Status: AC
  Administered 2022-01-01: 4 g via INTRAVENOUS
  Filled 2022-01-01: qty 100

## 2022-01-01 MED ORDER — DEXAMETHASONE SODIUM PHOSPHATE 10 MG/ML IJ SOLN
INTRAMUSCULAR | Status: AC
  Filled 2022-01-01: qty 1

## 2022-01-01 MED ORDER — ASCORBIC ACID 500 MG PO TABS
500.0000 mg | ORAL_TABLET | Freq: Two times a day (BID) | ORAL | Status: DC
  Administered 2022-01-02 – 2022-02-10 (×74): 500 mg via ORAL
  Filled 2022-01-01 (×76): qty 1

## 2022-01-01 MED ORDER — ONDANSETRON HCL 4 MG/2ML IJ SOLN
INTRAMUSCULAR | Status: DC | PRN
  Administered 2022-01-01: 4 mg via INTRAVENOUS

## 2022-01-01 MED ORDER — ONDANSETRON HCL 4 MG/2ML IJ SOLN
INTRAMUSCULAR | Status: AC
  Filled 2022-01-01: qty 2

## 2022-01-01 MED ORDER — SUCCINYLCHOLINE CHLORIDE 200 MG/10ML IV SOSY
PREFILLED_SYRINGE | INTRAVENOUS | Status: DC | PRN
  Administered 2022-01-01: 120 mg via INTRAVENOUS

## 2022-01-01 MED ORDER — FENTANYL CITRATE (PF) 100 MCG/2ML IJ SOLN
INTRAMUSCULAR | Status: DC | PRN
Start: 2022-01-01 — End: 2022-01-01
  Administered 2022-01-01: 100 ug via INTRAVENOUS
  Administered 2022-01-01: 25 ug via INTRAVENOUS

## 2022-01-01 MED ORDER — 0.9 % SODIUM CHLORIDE (POUR BTL) OPTIME
TOPICAL | Status: DC | PRN
  Administered 2022-01-01: 1000 mL

## 2022-01-01 MED ORDER — FLUTICASONE PROPIONATE 50 MCG/ACT NA SUSP
2.0000 | Freq: Every day | NASAL | Status: DC
  Administered 2022-01-01 – 2022-02-10 (×39): 2 via NASAL
  Filled 2022-01-01: qty 16

## 2022-01-01 MED ORDER — ATORVASTATIN CALCIUM 20 MG PO TABS
40.0000 mg | ORAL_TABLET | Freq: Every day | ORAL | Status: DC
Start: 2022-01-01 — End: 2022-01-15
  Administered 2022-01-01 – 2022-01-15 (×15): 40 mg via ORAL
  Filled 2022-01-01 (×16): qty 2

## 2022-01-01 MED ORDER — OXYCODONE HCL 5 MG/5ML PO SOLN: 5.0000 mg | Freq: Once | ORAL | Status: AC | PRN

## 2022-01-01 MED ORDER — SODIUM CHLORIDE 0.9 % IV SOLN
2.0000 g | INTRAVENOUS | Status: DC
  Administered 2022-01-01 – 2022-01-03 (×3): 2 g via INTRAVENOUS
  Filled 2022-01-01: qty 12.5
  Filled 2022-01-01: qty 2
  Filled 2022-01-01: qty 12.5
  Filled 2022-01-01: qty 2

## 2022-01-01 MED ORDER — SODIUM CHLORIDE 0.9% IV SOLUTION: Freq: Once | INTRAVENOUS | Status: AC

## 2022-01-01 MED ORDER — DEXMEDETOMIDINE (PRECEDEX) IN NS 20 MCG/5ML (4 MCG/ML) IV SYRINGE
PREFILLED_SYRINGE | INTRAVENOUS | Status: DC | PRN
  Administered 2022-01-01: 8 ug via INTRAVENOUS
  Administered 2022-01-01 (×3): 4 ug via INTRAVENOUS

## 2022-01-01 MED ORDER — ACETAMINOPHEN 10 MG/ML IV SOLN: 1000.0000 mg | Freq: Once | INTRAVENOUS | Status: DC | PRN

## 2022-01-01 MED ORDER — IOHEXOL 9 MG/ML PO SOLN
500.0000 mL | ORAL | Status: AC
  Administered 2022-01-01: 500 mL via ORAL

## 2022-01-01 MED ORDER — OXYCODONE HCL 5 MG PO TABS
5.0000 mg | ORAL_TABLET | Freq: Once | ORAL | Status: AC
  Administered 2022-01-01: 5 mg via ORAL
  Filled 2022-01-01: qty 1

## 2022-01-01 MED ORDER — OXYCODONE HCL 5 MG PO TABS
5.0000 mg | ORAL_TABLET | Freq: Once | ORAL | Status: AC | PRN
  Administered 2022-01-01: 5 mg via ORAL

## 2022-01-01 MED ORDER — HEPARIN SODIUM (PORCINE) 5000 UNIT/ML IJ SOLN: 5000.0000 [IU] | Freq: Three times a day (TID) | INTRAMUSCULAR | Status: DC

## 2022-01-01 MED ORDER — FENTANYL CITRATE (PF) 100 MCG/2ML IJ SOLN
25.0000 ug | INTRAMUSCULAR | Status: DC | PRN
  Administered 2022-01-01 (×3): 25 ug via INTRAVENOUS

## 2022-01-01 MED ORDER — ZINC SULFATE 220 (50 ZN) MG PO CAPS
220.0000 mg | ORAL_CAPSULE | Freq: Every day | ORAL | Status: AC
  Administered 2022-01-02 – 2022-01-15 (×13): 220 mg via ORAL
  Filled 2022-01-01 (×14): qty 1

## 2022-01-01 MED ORDER — OXYCODONE HCL 5 MG PO TABS
ORAL_TABLET | ORAL | Status: AC
  Filled 2022-01-01: qty 1

## 2022-01-01 MED ORDER — JUVEN PO PACK
1.0000 | PACK | Freq: Two times a day (BID) | ORAL | Status: DC
  Administered 2022-01-02 – 2022-02-10 (×55): 1 via ORAL

## 2022-01-01 SURGICAL SUPPLY — 38 items
BNDG GAUZE ELAST 4 BULKY (GAUZE/BANDAGES/DRESSINGS) ×2 IMPLANT
CANISTER WOUND CARE 500ML ATS (WOUND CARE) ×1 IMPLANT
CHLORAPREP W/TINT 26 (MISCELLANEOUS) ×2 IMPLANT
CNTNR SPEC 2.5X3XGRAD LEK (MISCELLANEOUS) ×1
CONT SPEC 4OZ STER OR WHT (MISCELLANEOUS) ×1
CONT SPEC 4OZ STRL OR WHT (MISCELLANEOUS) ×1
CONTAINER SPEC 2.5X3XGRAD LEK (MISCELLANEOUS) ×1 IMPLANT
DRAIN PENROSE 12X.25 LTX STRL (MISCELLANEOUS) ×2 IMPLANT
DRAPE LAPAROTOMY 77X122 PED (DRAPES) ×2 IMPLANT
DRSG GAUZE FLUFF 36X18 (GAUZE/BANDAGES/DRESSINGS) ×2 IMPLANT
DRSG VAC ATS LRG SENSATRAC (GAUZE/BANDAGES/DRESSINGS) ×1 IMPLANT
ELECT CAUTERY BLADE TIP 2.5 (TIP) ×2
ELECT REM PT RETURN 9FT ADLT (ELECTROSURGICAL) ×2
ELECTRODE CAUTERY BLDE TIP 2.5 (TIP) ×1 IMPLANT
ELECTRODE REM PT RTRN 9FT ADLT (ELECTROSURGICAL) ×1 IMPLANT
GAUZE 4X4 16PLY ~~LOC~~+RFID DBL (SPONGE) ×2 IMPLANT
GAUZE SPONGE 4X4 12PLY STRL (GAUZE/BANDAGES/DRESSINGS) ×2 IMPLANT
GLOVE SURG SYN 7.0 (GLOVE) ×2 IMPLANT
GLOVE SURG SYN 7.0 PF PI (GLOVE) ×1 IMPLANT
GLOVE SURG SYN 7.5  E (GLOVE) ×2
GLOVE SURG SYN 7.5 E (GLOVE) ×1 IMPLANT
GLOVE SURG SYN 7.5 PF PI (GLOVE) ×1 IMPLANT
GOWN STRL REUS W/ TWL LRG LVL3 (GOWN DISPOSABLE) ×2 IMPLANT
GOWN STRL REUS W/TWL LRG LVL3 (GOWN DISPOSABLE) ×4
KIT TURNOVER KIT A (KITS) ×2 IMPLANT
LABEL OR SOLS (LABEL) ×2 IMPLANT
MANIFOLD NEPTUNE II (INSTRUMENTS) ×2 IMPLANT
NEEDLE HYPO 22GX1.5 SAFETY (NEEDLE) ×2 IMPLANT
NS IRRIG 500ML POUR BTL (IV SOLUTION) ×2 IMPLANT
PACK BASIN MINOR ARMC (MISCELLANEOUS) ×2 IMPLANT
PAD ABD DERMACEA PRESS 5X9 (GAUZE/BANDAGES/DRESSINGS) ×2 IMPLANT
SOL PREP PVP 2OZ (MISCELLANEOUS) ×2
SOLUTION PREP PVP 2OZ (MISCELLANEOUS) ×1 IMPLANT
SPONGE T-LAP 18X18 ~~LOC~~+RFID (SPONGE) ×4 IMPLANT
SWAB CULTURE AMIES ANAERIB BLU (MISCELLANEOUS) ×4 IMPLANT
SYR 20ML LL LF (SYRINGE) ×2 IMPLANT
SYR BULB IRRIG 60ML STRL (SYRINGE) ×2 IMPLANT
WATER STERILE IRR 500ML POUR (IV SOLUTION) ×2 IMPLANT

## 2022-01-01 NOTE — Anesthesia Preprocedure Evaluation (Addendum)
Anesthesia Evaluation  Patient identified by MRN, date of birth, ID band Patient awake    Reviewed: Allergy & Precautions, NPO status , Patient's Chart, lab work & pertinent test results  Airway Mallampati: III  TM Distance: >3 FB Neck ROM: full    Dental  (+) Poor Dentition   Pulmonary sleep apnea ,    Pulmonary exam normal        Cardiovascular hypertension, Pt. on medications + DOE   Rhythm:Regular Rate:Tachycardia + Peripheral Edema- Systolic murmurs and - Diastolic murmurs    Neuro/Psych negative neurological ROS  negative psych ROS   GI/Hepatic GERD  Controlled,Elevated LFTs   Endo/Other  diabetes, Poorly Controlled, Type 2, Insulin DependentA1c 10  Renal/GU CRFRenal disease     Musculoskeletal   Abdominal (+) + obese,   Peds  Hematology  (+) Blood dyscrasia, anemia , S/p 2 units prbc   Anesthesia Other Findings Sacral decubitis wound  Lower extremity edema with erythema on calves and medial thigh. Pt states this has been present for weeks and due to a reaction to antibiotics. States she has had venous ultrasounds to rule out DVT.   Recent admission for fall. This admission was for sepsis with leuhyponatremia to 62 (now 18)  Past Medical History: No date: CKD (chronic kidney disease), stage III (HCC) No date: Diabetes ([place]) No date: Hyperlipidemia No date: Hypertension No date: Obesity  Past Surgical History: No date: ACHILLES TENDON REPAIR No date: ANKLE SURGERY No date: URETHRAL STRICTURE DILATATION  BMI    Body Mass Index: 37.88 kg/m      Reproductive/Obstetrics negative OB ROS                           Anesthesia Physical Anesthesia Plan  ASA: 4  Anesthesia Plan: General ETT   Post-op Pain Management: Ofirmev IV (intra-op)*   Induction: Intravenous  PONV Risk Score and Plan: 3 and Ondansetron, Dexamethasone and Treatment may vary due to age or medical  condition  Airway Management Planned: Oral ETT  Additional Equipment:   Intra-op Plan:   Post-operative Plan: Extubation in OR  Informed Consent: I have reviewed the patients History and Physical, chart, labs and discussed the procedure including the risks, benefits and alternatives for the proposed anesthesia with the patient or authorized representative who has indicated his/her understanding and acceptance.   Patient has DNR.  Suspend DNR.   Dental Advisory Given  Plan Discussed with: Anesthesiologist, CRNA and Surgeon  Anesthesia Plan Comments:        Anesthesia Quick Evaluation

## 2022-01-01 NOTE — Progress Notes (Signed)
Pharmacy Antibiotic Note  Theresa Warren is a 66 y.o. female admitted on 12/31/2021 with sepsis from unknown source.  Pharmacy has been consulted for Cefepime and Vancomycin dosing for 7 days.  Plan: Cefepime 2 gm q24h per indication & renal fxn.  Pt ordered initial dose of Vancomycin 2 gm Vancomycin 500 mg IV Q 36 hrs.  Goal AUC 400-550. Expected AUC: 546.3 SCr used: 4.77 Vd used: 0.5, BMI 37.9  Pharmacy will continue to follow and will adjust abx dosing whenever warranted.  Height: 5\' 3"  (160 cm) Weight: 97 kg (213 lb 13.5 oz) IBW/kg (Calculated) : 52.4  Temp (24hrs), Avg:99.4 F (37.4 C), Min:99.2 F (37.3 C), Max:99.6 F (37.6 C)  Recent Labs  Lab 12/31/21 1826  WBC 18.4*  CREATININE 4.61*    Estimated Creatinine Clearance: 13.7 mL/min (A) (by C-G formula based on SCr of 4.61 mg/dL (H)).    Allergies  Allergen Reactions   Codeine Nausea And Vomiting   Sulfa Antibiotics Swelling   Clindamycin/Lincomycin Rash   Penicillins Rash    Antimicrobials this admission: 6/01 Flagyl >> x 1 dose 6/01 Cefepime >> x 7 days 6/01 Vancomycin >> x 7 days  Microbiology results: 5/31 BCx: Pending  Thank you for allowing pharmacy to be a part of this patient's care.  Renda Rolls, PharmD, MBA 01/01/2022 3:20 AM

## 2022-01-01 NOTE — Op Note (Signed)
  Procedure Date:  01/01/2022  Pre-operative Diagnosis:  Unstageable decubitus wounds of bilateral buttocks  Post-operative Diagnosis: Stage 3 decubitus wounds of bilateral buttocks x 3  Procedure:  Debridement of three decubitus wounds of bilateral buttocks, total debridement area of 133 cm2;  Placement of negative pressure dressing > 50 cm.  Surgeon:  Melvyn Neth, MD  Assistant:  Rolinda Roan, PA-S  Anesthesia:  General endotracheal  Estimated Blood Loss:  10 ml  Specimens:   Culture swab of buttocks wound Tissue culture of buttocks wound  Findings:  Patient had three wounds that were debrided: Left lateral buttocks -- 6 x 5.5 cm -- 33 cm2 Left medial buttocks -- 8 x 7 cm -- 56 cm2 Right medial buttocks -- 8 x 5.5 cm -- 44 cm2  Complications:  None  Indications for Procedure:  This is a 66 y.o. female with diagnosis of bilateral buttocks decubitus wounds, requiring debridement.  The risks of bleeding, abscess or infection, injury to surrounding structures, and need for further procedures were all discussed with the patient and was willing to proceed.  Description of Procedure: The patient was correctly identified in the preoperative area and brought into the operating room.  The patient was placed supine with VTE prophylaxis in place.  Appropriate time-outs were performed.  Anesthesia was induced and the patient was intubated.  Appropriate antibiotics were infused.  The patient was placed in prone position.  The patient's buttocks were prepped and draped in usual sterile fashion.  Culture swab was obtained from all three wounds in one sample.  We started debridement with the right buttocks wound.  The necrotic tissue was debrided using cautery down to healthy tissue, and the wound extended to the subcutaneous layer, but not to muscle or bone.  The final debridement size was 8 x 5.5 cm.  We then proceeded to the left medial buttocks wound.  Similarly, the necrotic tissue was  debrided using cautery down to healthy tissue.  The wound extended also to the subcutaneous layer, but not to muscle or bone.  The final debridement size was 8 x 7 cm.  Finally, we worked on the left lateral buttocks wound.  The necrotic tissue was debrided using cautery down to healthy tissue.  The wound extended to the subcutaneous layer, but not to muscle or bone.  The final debridement size was 6 x 5.5 cm.  Some of the debrided tissue was sent for tissue culture.  After debridement was completed, the wounds were irrigated and cleaned.  The wounds were then dressed with three individual black sponges, bridging the three with a sponge going to the left anterior thigh/hip area and connected to suction.  There was good seal, without any complications.  The patient was then placed back in supine position, emerged from anesthesia, extubated, and brought to the recovery room for further management.  The patient tolerated the procedure well and all counts were correct at the end of the case.   Melvyn Neth, MD

## 2022-01-01 NOTE — Assessment & Plan Note (Addendum)
Resolved.  Presented with sodium 129.  This was noted on labs at [facility] and was the reason patient presented to the ED. improved with IV hydration. -Continue to monitor

## 2022-01-01 NOTE — Anesthesia Postprocedure Evaluation (Signed)
Anesthesia Post Note  Patient: Annamarie Yamaguchi  Procedure(s) Performed: INCISION AND DRAINAGE ABSCESS-Sacral Decubitus (Bilateral: Buttocks)  Patient location during evaluation: PACU Anesthesia Type: General Level of consciousness: awake and alert Pain management: pain level controlled Vital Signs Assessment: post-procedure vital signs reviewed and stable Respiratory status: spontaneous breathing, nonlabored ventilation, respiratory function stable and patient connected to nasal cannula oxygen Cardiovascular status: blood pressure returned to baseline and stable Postop Assessment: no apparent nausea or vomiting Anesthetic complications: no   No notable events documented.   Last Vitals:  Vitals:   01/01/22 1743 01/01/22 1828  BP: (!) 131/55 133/71  Pulse: (!) 101 97  Resp: 16 15  Temp: 37 C 36.5 C  SpO2: 95% 97%    Last Pain:  Vitals:   01/01/22 1828  TempSrc: Oral  PainSc:                  Arita Miss

## 2022-01-01 NOTE — Assessment & Plan Note (Addendum)
On admission, alk phos 212, AST 182, ALT 107.  Unclear etiology, repeat LFTs not available today.  Appears LFTs were elevated at time of discharge on 5/23 but have increased since. RUQ ultrasound unremarkable. Acute hepatitis panel negative. -- Follow CMP

## 2022-01-01 NOTE — Anesthesia Procedure Notes (Signed)
Procedure Name: Intubation Date/Time: 01/01/2022 3:09 PM Performed by: Loletha Grayer, CRNA Pre-anesthesia Checklist: Patient identified, Patient being monitored, Timeout performed, Emergency Drugs available and Suction available Patient Re-evaluated:Patient Re-evaluated prior to induction Oxygen Delivery Method: Circle system utilized Preoxygenation: Pre-oxygenation with 100% oxygen Induction Type: IV induction Ventilation: Mask ventilation without difficulty Laryngoscope Size: 3 and McGraph Grade View: Grade I Tube type: Oral Tube size: 7.0 mm Number of attempts: 1 Airway Equipment and Method: Stylet Placement Confirmation: ETT inserted through vocal cords under direct vision, positive ETCO2 and breath sounds checked- equal and bilateral Secured at: 21 cm Tube secured with: Tape Dental Injury: Teeth and Oropharynx as per pre-operative assessment

## 2022-01-01 NOTE — Assessment & Plan Note (Addendum)
CBG mildly elevated. -Continue Semglee -Continue with SSI

## 2022-01-01 NOTE — Assessment & Plan Note (Addendum)
Not transition to ESRD and patient was started on dialysis. -Continue with dialysis -Appreciate nephrology help

## 2022-01-01 NOTE — Assessment & Plan Note (Signed)
Present on admission.  Patient has multiple sacral pressure injuries.  Some concern at time of admission that these were the source of sepsis. General surgery consulted and took patient to the OR today 6/1 for debridement.   Now with wound VAC. -- Wound care consult -- Frequent repositioning --Follow general surgery recommendations Pressure Injury 01/01/22 Buttocks Left Stage 3 -  Full thickness tissue loss. Subcutaneous fat may be visible but bone, tendon or muscle are NOT exposed. (Active)  01/01/22 0222  Location: Buttocks  Location Orientation: Left  Staging: Stage 3 -  Full thickness tissue loss. Subcutaneous fat may be visible but bone, tendon or muscle are NOT exposed.  Wound Description (Comments):   Present on Admission: Yes     Pressure Injury 01/01/22 Buttocks Right Stage 2 -  Partial thickness loss of dermis presenting as a shallow open injury with a red, pink wound bed without slough. (Active)  01/01/22 0223  Location: Buttocks  Location Orientation: Right  Staging: Stage 2 -  Partial thickness loss of dermis presenting as a shallow open injury with a red, pink wound bed without slough.  Wound Description (Comments):   Present on Admission: Yes     Pressure Injury 01/01/22 Hip Left Stage 2 -  Partial thickness loss of dermis presenting as a shallow open injury with a red, pink wound bed without slough. (Active)  01/01/22 0225  Location: Hip  Location Orientation: Left  Staging: Stage 2 -  Partial thickness loss of dermis presenting as a shallow open injury with a red, pink wound bed without slough.  Wound Description (Comments):   Present on Admission: Yes     Pressure Injury 01/01/22 Hip Right Stage 3 -  Full thickness tissue loss. Subcutaneous fat may be visible but bone, tendon or muscle are NOT exposed. (Active)  01/01/22 0225  Location: Hip  Location Orientation: Right  Staging: Stage 3 -  Full thickness tissue loss. Subcutaneous fat may be visible but bone, tendon  or muscle are NOT exposed.  Wound Description (Comments):   Present on Admission: Yes

## 2022-01-01 NOTE — Consult Note (Addendum)
[facility] SURGICAL CONSULTATION NOTE (initial) - cpt: 99254   HISTORY OF PRESENT ILLNESS (HPI):  66 y.o. female presented to [facility] ED overnight for evaluation of abnormal labs. Patient had a recent admission from 05/18 - 05/23 for a fall at home. On chart review, it does seem that she had laid on the floor for 2 days prior to being able to get up and call 911. She was seen by [facility] RN on 05/22 for these wounds and local wound care was ordered. She was discharged to [facility]. She represented to the ED yesterday secondary to being found to be hyponatremic on lab work at [facility]. Work up in the ED revealed a leukocytosis to 18.4K (now 17.6K), Hgb to 6.9 (now 6.3), chronic renal disease with sCr - 4.61, hyponatremia to 129 (now 132), and normal lactic acidosis. She was admitted to the Hospitalist for presumed sepsis and started on Cefepime and Vancomycin.   Surgery is consulted by hospitalist physician Dr. Rupert Stacks, DO in this context for evaluation and management of infected sacral decubitus ulcer.  PAST MEDICAL HISTORY (PMH):  Past Medical History:  Diagnosis Date   CKD (chronic kidney disease), stage III (HCC)    Diabetes ([place])    Hyperlipidemia    Hypertension    Obesity      PAST SURGICAL HISTORY ([place]):  Past Surgical History:  Procedure Laterality Date   ACHILLES TENDON REPAIR     ANKLE SURGERY     URETHRAL STRICTURE DILATATION       MEDICATIONS:  Prior to Admission medications   Medication Sig Start Date End Date Taking? Authorizing Provider  allopurinol (ZYLOPRIM) 100 MG tablet Take 100 mg by mouth daily.    [provider]  atorvastatin (LIPITOR) 40 MG tablet Take 40 mg by mouth daily.    [provider]  fexofenadine (ALLEGRA) 180 MG tablet Take 180 mg by mouth daily.    [provider]  fluticasone (FLONASE) 50 MCG/ACT nasal spray Place 2 sprays into both nostrils daily. 11/27/14 12/19/21  Vilinda Boehringer, MD  furosemide  (LASIX) 20 MG tablet Take 20 mg by mouth daily.    [provider]  HUMULIN R 500 UNIT/ML SOLN injection Inject 25-30 Units into the skin 2 (two) times daily with a meal. Patient reports she uses Humulin R U500 vials and U100 insulin syringe. Draws up to 30 unit mark on U100 syringe QAM, Humulin R U500 25 unit mark on U100 syringe QPM 07/10/14   [provider]  losartan (COZAAR) 50 MG tablet Take 1 tablet (50 mg total) by mouth daily. 12/24/21   Sharen Hones, MD  metoprolol tartrate (LOPRESSOR) 25 MG tablet Take 1 tablet (25 mg total) by mouth 2 (two) times daily. 12/23/21   Sharen Hones, MD  nystatin (MYCOSTATIN/NYSTOP) powder Apply topically. 10/28/21   [provider]  Omega-3 Fatty Acids (FISH OIL) 500 MG CAPS Take by mouth.    [provider]  sodium bicarbonate 650 MG tablet Take 1 tablet (650 mg total) by mouth 2 (two) times daily. 12/23/21 12/23/22  Sharen Hones, MD  triamcinolone cream (KENALOG) 0.1 % Apply 1 application. topically 3 (three) times daily. 11/12/21   [provider]  Vitamin D, Ergocalciferol, (DRISDOL) 50000 UNITS CAPS capsule Take 1 capsule by mouth 2 (two) times a week. 07/22/14   [provider]     ALLERGIES:  Allergies  Allergen Reactions   Codeine Nausea And Vomiting   Sulfa Antibiotics Swelling  Clindamycin/Lincomycin Rash   Penicillins Rash     SOCIAL HISTORY:  Social History   Socioeconomic History   Marital status: Divorced    Spouse name: Not on file   Number of children: Not on file   Years of education: Not on file   Highest education level: Not on file  Occupational History   Occupation: Works [employer]  Tobacco Use   Smoking status: Never   Smokeless tobacco: Not on file  Substance and Sexual Activity   Alcohol use: Not Currently   Drug use: Not Currently   Sexual activity: Not Currently  Other Topics Concern   Not on file  Social History Narrative   Lives alone   Social  Determinants of Health   Financial Resource Strain: Not on file  Food Insecurity: Not on file  Transportation Needs: Not on file  Physical Activity: Not on file  Stress: Not on file  Social Connections: Not on file  Intimate Partner Violence: Not on file     FAMILY HISTORY:  Family History  Problem Relation Age of Onset   Alzheimer's disease Father    Diabetes Father    CAD Father 45   CAD Other        Multliple maternal aunts and uncles with early onset heart disease   Lung cancer Sister    ALS Mother       REVIEW OF SYSTEMS:  Review of Systems  Constitutional:  Positive for malaise/fatigue. Negative for chills and fever.  Respiratory:  Negative for cough and shortness of breath.   Cardiovascular:  Negative for palpitations.  Gastrointestinal:  Negative for abdominal pain, nausea and vomiting.  Musculoskeletal:  Positive for falls. Negative for myalgias.  Skin:        + Infected Sacral Decubitus Ulcer  All other systems reviewed and are negative.  VITAL SIGNS:  Temp:  [98.4 F (36.9 C)-99.6 F (37.6 C)] 99.2 F (37.3 C) (06/01 0637) Pulse Rate:  [105-115] 105 (06/01 0637) Resp:  [16-20] 18 (06/01 0637) BP: (120-142)/(57-75) 132/64 (06/01 0637) SpO2:  [98 %-100 %] 98 % (06/01 0637) Weight:  [97 kg] 97 kg (05/31 1824)     Height: 5\' 3"  (160 cm) Weight: 97 kg BMI (Calculated): 37.89   INTAKE/OUTPUT:  05/31 0701 - 06/01 0700 In: 414.2 [Blood:314.2; IV Piggyback:100] Out: -   PHYSICAL EXAM:  Physical Exam Vitals and nursing note reviewed. Exam conducted with a chaperone present.  Constitutional:      General: She is not in acute distress.    Appearance: Normal appearance. She is obese. She is not ill-appearing.     Comments: Patient is somnolent but arouses to verbal stimuli briefly, she will stay awake to participate some but ultimately drifts off to sleep, NAD  HENT:     Head: Normocephalic and atraumatic.  Eyes:     General: No scleral icterus.     Conjunctiva/sclera: Conjunctivae normal.  Cardiovascular:     Rate and Rhythm: Normal rate and regular rhythm.     Pulses: Normal pulses.  Pulmonary:     Effort: Pulmonary effort is normal. No respiratory distress.  Abdominal:     Palpations: Abdomen is soft.     Tenderness: There is no abdominal tenderness. There is no guarding.  Genitourinary:    Comments: Deferred Musculoskeletal:     Right lower leg: 2+ Pitting Edema present.     Left lower leg: 2+ Pitting Edema present.  Skin:    General: Skin is warm and  dry.          Comments: She has an unstageable and certainly infected/necrotic appear ulceration to the sacrum and bilateral gluteus with some involvement of the gluteal crease. There is a few stage one pressure injuries scattered throughout the bilateral hips as well.   Neurological:     General: No focal deficit present.     Mental Status: She is alert. Mental status is at baseline.  Psychiatric:        Mood and Affect: Mood normal.        Behavior: Behavior normal.    Sacral/Gluteal Wound (01/01/2022):     Labs:     Latest Ref Rng & Units 01/01/2022    2:02 AM 12/31/2021    6:26 PM 12/23/2021    3:40 AM  CBC  WBC 4.0 - 10.5 K/uL 17.6   18.4   10.8    Hemoglobin 12.0 - 15.0 g/dL 6.3   6.9   7.6    Hematocrit 36.0 - 46.0 % 19.9   22.1   23.9    Platelets 150 - 400 K/uL 410   473   311        Latest Ref Rng & Units 01/01/2022    2:02 AM 12/31/2021    6:26 PM 12/23/2021    3:40 AM  CMP  Glucose 70 - 99 mg/dL 243   181   98    BUN 8 - 23 mg/dL 98   100   91    Creatinine 0.44 - 1.00 mg/dL 4.77   4.61   3.91    Sodium 135 - 145 mmol/L 132   129   137    Potassium 3.5 - 5.1 mmol/L 5.1   5.1   3.8    Chloride 98 - 111 mmol/L 105   99   104    CO2 22 - 32 mmol/L 17   17   22     Calcium 8.9 - 10.3 mg/dL 8.0   8.3   8.8    Total Protein 6.5 - 8.1 g/dL 6.3   7.0     Total Bilirubin 0.3 - 1.2 mg/dL 0.7   0.5     Alkaline Phos 38 - 126 U/L 212   229     AST 15 - 41 U/L  182   192     ALT 0 - 44 U/L 107   116       Imaging studies:   CT Abdomen/Pelvis (01/01/2022) personally reviewed without any acute intra-abdominal findings and radiologist report reviewed below:  IMPRESSION: 1. Abnormal low-density endometrial thickening for a postmenopausal patient. Endometrial sampling is indicated to exclude carcinoma. If results are benign, sonohysterogram should be considered for focal lesion work-up. (Ref: Radiological Reasoning: Algorithmic Workup of Abnormal Vaginal Bleeding with Endovaginal Sonography and Sonohysterography. AJR 2008; 409:W11-91).   2. Bladder distension estimated at 520 mL. Query urinary retention.   3. No other acute or inflammatory process identified in the noncontrast abdomen or pelvis. Aortic Atherosclerosis (ICD10-I70.0).   Assessment/Plan:  66 y.o. female with infected and unstageable sacral/gluteal ulcer.   - Her sacral/gluteal wound will benefit from debridement in the operating room. We can do this today with Dr Kirke Corin pending OR/Anesthesia availability - All risks, benefits, and alternatives to above procedure(s) were discussed with the patient, all of her questions were answered to her expressed satisfaction, patient expresses she wishes to proceed, and informed consent was obtained.    - NPO for procedure; IVF resuscitation    -  IV Abx (Cefepime, Vancomycin)   - Will likely benefit from transfusion with goal Hgb >7  - Local wound care; pressure offload, frequent repositioning, consider low air loss mattress  - Pain control prn  - Further management per primary service; we will follow   All of the above findings and recommendations were discussed with the patient, and all of patient's questions were answered to her expressed satisfaction.  Thank you for the opportunity to participate in this patient's care.   -- Edison Simon, PA-C [facility] 01/01/2022, 7:14 AM M-F: 7am - 4pm

## 2022-01-01 NOTE — Assessment & Plan Note (Addendum)
With redness, patient attributes it to clindamycin.  She reported on admission that this has been present for over one month.   Left lower extremity Doppler US on 12/18/21 was negative for DVT.  Monitor closely for now.

## 2022-01-01 NOTE — Progress Notes (Signed)
Progress Note   Patient: Theresa Warren ZDG:[id number] DOB: 1955-12-18 DOA: 12/31/2021     0 DOS: the patient was seen and examined on 01/01/2022   Brief hospital course: Ms. Luvenia Cranford is a 66 year old female hyperlipidemia, hypertension, CKD stage V, insulin-dependent diabetes mellitus, who presents emergency department from [facility] for evaluation of low sodium noted on labs at the facility.  She was discharged to rehab on 5/23 after being admitted following a fall and also with acute on chronic anemia.  ED Course - temp 99.6, HR 107, otherwise unremarkable vitals. Labs with sodium 129, potassium 5.1, chloride 99, bicarb 17, BUN of 100, serum creatinine of 4.61, GFR of 10, WBC 18.4, hemoglobin 6.9, platelets of 473.  LFT's were also elevated.  Patient met criteria for sepsis on arrival, treated per protocol in the ED with IV Cefepime, Flagyl, Vanc, and LR 1 L bolus fluids.  Infection source unclear but felt possibly due to sacral wound infection and concern for underlying osteomyelitis.  Admitted to the hospitalist service for further evaluation and management.  Continued on IV Cefepime and Vanc.   General surgery was consulted for debridement on sacral pressure injury.  Nephrology consulted due to worsening renal function.   Assessment and Plan: * Severe sepsis ([place]) POA as evidenced by tachycardia, leukocytosis, AKI and transaminitis consistent with organ dysfunction and severe sepsis with suspected infection source being sacral wounds versus an abdominal source (CT abdomen pelvis negative for anything acute however).  MRSA PCR negative. --Continue IV cefepime, vancomycin for now -- Continue IV fluids for now -- Follow blood and wound cultures -- Monitor for signs and symptoms of other infection -- Monitor wounds closely  Type 2 diabetes mellitus with chronic kidney disease, with long-term current use of insulin (HCC) Sliding scale NovoLog. Semglee 20 units twice  daily. Adjust insulin for inpatient goal 140-180  DNR (do not resuscitate)/DNI(Do Not Intubate) Admitting hospitalist confirmed DNR CODE STATUS with patient during admission encounter  Essential hypertension Losartan held due to AKI. As needed hydralazine for now.  Chronic kidney disease, stage 5 (HCC) See AKI.  Nephrology following.  Hyponatremia Presented with sodium 129.  This was noted on labs at [facility] and was the reason patient presented to the ED. sodium today improved to 133 with IV hydration. --Continue IV fluids -- Monitor BMP --Monitor volume status closely   AKI (acute kidney injury) ([place]) Superimposed on CKD stage V.  Suspect prerenal secondary to poor p.o. intake versus intrarenal secondary to sepsis.  Treated with 1 L bolus LR in the ED.  In chart review, appears discussions have been initiated regarding dialysis. -- Consult nephrology --Continue IV fluids for now - Daily BMP -- Avoid nephrotoxins, hypotension -- Renally dose meds  Abdominal pain Patient reported on admission.  CT of abdomen pelvis showed abnormal endometrial thickening for a postmenopausal patient, bladder distention estimated 520 mL, no other acute or inflammatory processes or acute findings. --Monitor clinically for now -- Needs OB/GYN follow-up for endometrial sampling to exclude endometrial carcinoma  Swelling of left lower extremity With redness, patient attributes it to clindamycin.  She reported on admission that this has been present for over one month.   Left lower extremity Doppler US on 12/18/21 was negative for DVT.  Monitor closely for now.  Sacral wound, initial encounter See pressure injury  Elevated LFTs On admission, alk phos 212, AST 182, ALT 107.  Unclear etiology, repeat LFTs not available today.  Appears LFTs were elevated at time of discharge on  5/23 but have increased since. --Right upper quadrant ultrasound -- Acute hepatitis panel -- Follow CMP  Symptomatic  anemia Hemoglobin on admission was 6.9 >> 6.3, down from 7.6 at time of discharge on 5/23.  No signs of active bleeding, did have vaginal/endometrial bleeding previous admission but no reports of this recently.  May have slow chronic blood loss from sacral wounds.  Has baseline anemia of chronic disease due to renal failure. Received 1 unit PRBC transfusion on admission. -- Transfusing another unit RBCs today -- Trend hemoglobin and transfuse if less than 7  Pressure injury of skin Present on admission.  Patient has multiple sacral pressure injuries.  Some concern at time of admission that these were the source of sepsis. General surgery consulted and took patient to the OR today 6/1 for debridement.   Now with wound VAC. -- Wound care consult -- Frequent repositioning --Follow general surgery recommendations Pressure Injury 01/01/22 Buttocks Left Stage 3 -  Full thickness tissue loss. Subcutaneous fat may be visible but bone, tendon or muscle are NOT exposed. (Active)  01/01/22 0222  Location: Buttocks  Location Orientation: Left  Staging: Stage 3 -  Full thickness tissue loss. Subcutaneous fat may be visible but bone, tendon or muscle are NOT exposed.  Wound Description (Comments):   Present on Admission: Yes     Pressure Injury 01/01/22 Buttocks Right Stage 2 -  Partial thickness loss of dermis presenting as a shallow open injury with a red, pink wound bed without slough. (Active)  01/01/22 0223  Location: Buttocks  Location Orientation: Right  Staging: Stage 2 -  Partial thickness loss of dermis presenting as a shallow open injury with a red, pink wound bed without slough.  Wound Description (Comments):   Present on Admission: Yes     Pressure Injury 01/01/22 Hip Left Stage 2 -  Partial thickness loss of dermis presenting as a shallow open injury with a red, pink wound bed without slough. (Active)  01/01/22 0225  Location: Hip  Location Orientation: Left  Staging: Stage 2 -   Partial thickness loss of dermis presenting as a shallow open injury with a red, pink wound bed without slough.  Wound Description (Comments):   Present on Admission: Yes     Pressure Injury 01/01/22 Hip Right Stage 3 -  Full thickness tissue loss. Subcutaneous fat may be visible but bone, tendon or muscle are NOT exposed. (Active)  01/01/22 0225  Location: Hip  Location Orientation: Right  Staging: Stage 3 -  Full thickness tissue loss. Subcutaneous fat may be visible but bone, tendon or muscle are NOT exposed.  Wound Description (Comments):   Present on Admission: Yes      OSA (obstructive sleep apnea) .  Appears not to be on CPAP        Subjective: Patient was awake working with nursing to get up out of bed to use the bathroom.  She reported overall feeling okay, reports just feeling very tired.  Denied fevers chills, nausea vomiting, cough congestion or sore throat.  Physical Exam: Vitals:   01/01/22 1657 01/01/22 1700 01/01/22 1701 01/01/22 1711  BP:  138/69 138/69   Pulse:  (!) 108 (!) 108 (!) 102  Resp:  (!) 21 (!) 25 12  Temp: 98.8 F (37.1 C)     TempSrc:      SpO2:  96% 96% 94%  Weight:      Height:       General exam: awake, alert, no acute distress HEENT:  atraumatic, clear conjunctiva, anicteric sclera, moist mucus membranes, hearing grossly normal  Respiratory system: CTAB, no wheezes, rales or rhonchi, normal respiratory effort. Cardiovascular system: normal S1/S2,  RRR, no JVD, murmurs, rubs, gallops, 2-3+ lower extremity edema bilaterally.   Gastrointestinal system: soft, nontender nondistended abdomen Central nervous system: A&O x3. no gross focal neurologic deficits, normal speech Extremities: Pitting bilateral lower extremity edema and venous stasis changes, normal tone, no differential warmth appreciated Skin: dry, intact, normal temperature Psychiatry: normal mood, congruent affect, judgement and insight appear normal   Data Reviewed:  Notable  labs: Sodium 133, glucose 157, BUN 88, creatinine 4.80 rising, ionized calcium slightly low 1.11, hemoglobin 8.5 post transfusions. Lactic acid normalized 1.0.  Leukocytosis improving from 17.6 down to 13.8  Family Communication: None  Disposition: Status is: Inpatient Remains inpatient appropriate because: Severity of illness with anemia requiring transfusion, wound debridement today, remains on IV antibiotics for sepsis pending cultures and further evaluation.   Planned Discharge Destination: Skilled nursing facility    Time spent: 45 minutes  Author: Ezekiel Slocumb, DO 01/01/2022 5:18 PM  For on call review [email or web address].

## 2022-01-01 NOTE — Assessment & Plan Note (Signed)
.    Appears not to be on CPAP

## 2022-01-01 NOTE — Progress Notes (Signed)
Sepsis tracking by eLINK 

## 2022-01-01 NOTE — Assessment & Plan Note (Addendum)
Please see above as patient is now on dialysis.

## 2022-01-01 NOTE — ED Notes (Signed)
Patient transported to floor by RN.  All personal belongings transported with patient.

## 2022-01-01 NOTE — Progress Notes (Signed)
Initial Nutrition Assessment  DOCUMENTATION CODES:   Morbid obesity  INTERVENTION:   -Once diet is advanced, add:  -Ensure Max po daily, each supplement provides 150 kcal and 30 grams of protein.   -1 packet Juven BID, each packet provides 95 calories, 2.5 grams of protein (collagen), and 9.8 grams of carbohydrate (3 grams sugar); also contains 7 grams of L-arginine and L-glutamine, 300 mg vitamin C, 15 mg vitamin E, 1.2 mcg vitamin B-12, 9.5 mg zinc, 200 mg calcium, and 1.5 g  Calcium Beta-hydroxy-Beta-methylbutyrate to support wound healing  -MVI with minerals daily -500 mg vitamin C BID -220 mg zinc sulfate daily x 14 days  NUTRITION DIAGNOSIS:   Increased nutrient needs related to wound healing as evidenced by estimated needs.  GOAL:   Patient will meet greater than or equal to 90% of their needs  MONITOR:   PO intake, Supplement acceptance, Diet advancement  REASON FOR ASSESSMENT:   Malnutrition Screening Tool    ASSESSMENT:   Pt with PMH of hyperlipidemia, hypertension, insulin-dependent diabetes mellitus, who presents from [facility] for chief concerns of abnormal serum sodium levels on labs.  Pt admitted with severe sepsis.   Reviewed I/O's: +414 ml x 24 hours   Case discussed with nurse tech. Pt NPO and fairly lethargic, but will sometimes respond to close ended questions.   Pt reports concern about pain, secondary to her "back wounds"; she shares with this RD that "I got them when I was laying on the floor for 2 days until they found me". Pt unable to provide significant nutrition history.   Per general surgery notes, plan for OR for debridement of wounds.   Reviewed wt hx; pt with distant history of weight loss.   Lab Results  Component Value Date   HGBA1C 10.0 (H) 12/18/2021   PTA DM medications are 25-30 units humulin U-500 BID.   Labs reviewed: Na: 132, Mg: 1.0, CBGS: 176-206 (inpatient orders for glycemic control are 0-9 units insulin aspart  TID with meals, 0-5 units insulin aspart daily at bedtime, and 20 units insulin glargine-yfgn daily).    NUTRITION - FOCUSED PHYSICAL EXAM:  Flowsheet Row Most Recent Value  Orbital Region No depletion  Upper Arm Region No depletion  Thoracic and Lumbar Region No depletion  Buccal Region No depletion  Temple Region No depletion  Clavicle Bone Region No depletion  Clavicle and Acromion Bone Region No depletion  Scapular Bone Region No depletion  Dorsal Hand No depletion  Patellar Region No depletion  Anterior Thigh Region No depletion  Posterior Calf Region No depletion  Edema (RD Assessment) Moderate  Hair Reviewed  Eyes Reviewed  Mouth Reviewed  Skin Reviewed  Nails Reviewed       Diet Order:   Diet Order             Diet NPO time specified Except for: Sips with Meds  Diet effective now                   EDUCATION NEEDS:   Not appropriate for education at this time  Skin:  Skin Assessment: Skin Integrity Issues: Skin Integrity Issues:: Stage III, Stage II Stage II: rt buttocks, bilateral buttocks, lt hips Stage III: lt buttocks, rt hip  Last BM:  01/01/22  Height:   Ht Readings from Last 1 Encounters:  12/31/21 5\' 3"  (1.6 m)    Weight:   Wt Readings from Last 1 Encounters:  12/31/21 97 kg    Ideal Body Weight:  52.3 kg  BMI:  Body mass index is 37.88 kg/m.  Estimated Nutritional Needs:   Kcal:  1650-1850  Protein:  90-105 grams  Fluid:  > 1.6 L    Loistine Chance, RD, LDN, [place] Registered Dietitian II Certified Diabetes Care and Education Specialist Please refer to [facility] for RD and/or RD on-call/weekend/after hours pager

## 2022-01-01 NOTE — Transfer of Care (Signed)
Immediate Anesthesia Transfer of Care Note  Patient: Theresa Warren  Procedure(s) Performed: INCISION AND DRAINAGE ABSCESS-Sacral Decubitus (Bilateral: Buttocks)  Patient Location: PACU  Anesthesia Type:General  Level of Consciousness: awake  Airway & Oxygen Therapy: Patient Spontanous Breathing and Patient connected to face mask oxygen  Post-op Assessment: Report given to RN and Post -op Vital signs reviewed and stable  Post vital signs: Reviewed and stable  Last Vitals:  Vitals Value Taken Time  BP 138/69 01/01/22 1700  Temp    Pulse 109 01/01/22 1701  Resp 20 01/01/22 1701  SpO2 96 % 01/01/22 1701  Vitals shown include unvalidated device data.  Last Pain:  Vitals:   01/01/22 1452  TempSrc: Oral  PainSc:       Patients Stated Pain Goal: 3 (Was an 8 before treated by night RN) (24/46/28 6381)  Complications: No notable events documented.

## 2022-01-01 NOTE — Assessment & Plan Note (Addendum)
Patient reported on admission.  CT of abdomen pelvis showed abnormal endometrial thickening for a postmenopausal patient, bladder distention estimated 520 mL, no other acute or inflammatory processes or acute findings. --Monitor clinically for now -- Needs OB/GYN follow-up for endometrial sampling to exclude endometrial carcinoma

## 2022-01-01 NOTE — Assessment & Plan Note (Addendum)
Admitting hospitalist confirmed DNR CODE STATUS with patient during admission encounter

## 2022-01-01 NOTE — Assessment & Plan Note (Addendum)
See above. Pictures in surgery note

## 2022-01-01 NOTE — Assessment & Plan Note (Addendum)
Hemoglobin on admission was 6.9 >> 6.3, down from 7.6 at time of discharge on 5/23.  No signs of active bleeding, did have vaginal/endometrial bleeding previous admission but no reports of this recently.  May have slow chronic blood loss from sacral wounds.  Has baseline anemia of chronic disease due to renal failure. Received 2 unit PRBC transfusion . -Continue to monitor -Transfuse if below 7

## 2022-01-02 ENCOUNTER — Encounter: Payer: Self-pay | Admitting: Surgery

## 2022-01-02 DIAGNOSIS — R6520 Severe sepsis without septic shock: Secondary | ICD-10-CM

## 2022-01-02 DIAGNOSIS — I10 Essential (primary) hypertension: Secondary | ICD-10-CM

## 2022-01-02 DIAGNOSIS — E871 Hypo-osmolality and hyponatremia: Secondary | ICD-10-CM

## 2022-01-02 DIAGNOSIS — L89323 Pressure ulcer of left buttock, stage 3: Secondary | ICD-10-CM | POA: Diagnosis not present

## 2022-01-02 DIAGNOSIS — I491 Atrial premature depolarization: Secondary | ICD-10-CM | POA: Diagnosis present

## 2022-01-02 DIAGNOSIS — A419 Sepsis, unspecified organism: Secondary | ICD-10-CM

## 2022-01-02 DIAGNOSIS — I4891 Unspecified atrial fibrillation: Secondary | ICD-10-CM

## 2022-01-02 DIAGNOSIS — L89313 Pressure ulcer of right buttock, stage 3: Secondary | ICD-10-CM | POA: Diagnosis not present

## 2022-01-02 LAB — GLUCOSE, CAPILLARY
Glucose-Capillary: 90 mg/dL (ref 70–99)
Glucose-Capillary: 118 mg/dL — ABNORMAL HIGH (ref 70–99)
Glucose-Capillary: 104 mg/dL — ABNORMAL HIGH (ref 70–99)
Glucose-Capillary: 80 mg/dL (ref 70–99)

## 2022-01-02 LAB — COMPREHENSIVE METABOLIC PANEL
ALT: 67 U/L — ABNORMAL HIGH (ref 0–44)
AST: 65 U/L — ABNORMAL HIGH (ref 15–41)
Albumin: 2 g/dL — ABNORMAL LOW (ref 3.5–5.0)
Alkaline Phosphatase: 175 U/L — ABNORMAL HIGH (ref 38–126)
Anion gap: 11 (ref 5–15)
BUN: 89 mg/dL — ABNORMAL HIGH (ref 8–23)
CO2: 17 mmol/L — ABNORMAL LOW (ref 22–32)
Calcium: 8.6 mg/dL — ABNORMAL LOW (ref 8.9–10.3)
Chloride: 108 mmol/L (ref 98–111)
Creatinine, Ser: 4.12 mg/dL — ABNORMAL HIGH (ref 0.44–1.00)
GFR, Estimated: 12 mL/min — ABNORMAL LOW (ref >60)
Glucose, Bld: 100 mg/dL — ABNORMAL HIGH (ref 70–99)
Potassium: 4.7 mmol/L (ref 3.5–5.1)
Sodium: 136 mmol/L (ref 135–145)
Total Bilirubin: 0.9 mg/dL (ref 0.3–1.2)
Total Protein: 6.5 g/dL (ref 6.5–8.1)

## 2022-01-02 LAB — URINALYSIS, ROUTINE W REFLEX MICROSCOPIC
Bilirubin Urine: NEGATIVE
Glucose, UA: NEGATIVE mg/dL
Ketones, ur: NEGATIVE mg/dL
Leukocytes,Ua: NEGATIVE
Nitrite: NEGATIVE
Protein, ur: 100 mg/dL — AB
Specific Gravity, Urine: 1.013 (ref 1.005–1.030)
pH: 5 (ref 5.0–8.0)

## 2022-01-02 LAB — CBC
HCT: 25.9 % — ABNORMAL LOW (ref 36.0–46.0)
Hemoglobin: 8.5 g/dL — ABNORMAL LOW (ref 12.0–15.0)
MCH: 30.2 pg (ref 26.0–34.0)
MCHC: 32.8 g/dL (ref 30.0–36.0)
MCV: 92.2 fL (ref 80.0–100.0)
Platelets: 405 10*3/uL — ABNORMAL HIGH (ref 150–400)
RBC: 2.81 MIL/uL — ABNORMAL LOW (ref 3.87–5.11)
RDW: 15.9 % — ABNORMAL HIGH (ref 11.5–15.5)
WBC: 16.4 10*3/uL — ABNORMAL HIGH (ref 4.0–10.5)
nRBC: 0 % (ref 0.0–0.2)

## 2022-01-02 LAB — TYPE AND SCREEN
ABO/RH(D): O POS
Antibody Screen: NEGATIVE
Unit division: 0

## 2022-01-02 LAB — BPAM RBC
Blood Product Expiration Date: 202306292359
ISSUE DATE / TIME: 202306010440
Unit Type and Rh: 5100

## 2022-01-02 LAB — HEPATITIS PANEL, ACUTE
HCV Ab: NONREACTIVE
Hep A IgM: NONREACTIVE
Hep B C IgM: NONREACTIVE
Hepatitis B Surface Ag: NONREACTIVE

## 2022-01-02 LAB — MAGNESIUM: Magnesium: 1.7 mg/dL (ref 1.7–2.4)

## 2022-01-02 MED ORDER — FUROSEMIDE 10 MG/ML IJ SOLN
60.0000 mg | Freq: Two times a day (BID) | INTRAMUSCULAR | Status: DC
  Administered 2022-01-02 – 2022-01-04 (×4): 60 mg via INTRAVENOUS
  Filled 2022-01-02 (×4): qty 6

## 2022-01-02 MED ORDER — METOPROLOL TARTRATE 5 MG/5ML IV SOLN
5.0000 mg | Freq: Once | INTRAVENOUS | Status: AC
Start: 2022-01-02 — End: 2022-01-02
  Administered 2022-01-02: 5 mg via INTRAVENOUS
  Filled 2022-01-02: qty 5

## 2022-01-02 MED ORDER — SODIUM BICARBONATE 650 MG PO TABS
1300.0000 mg | ORAL_TABLET | Freq: Two times a day (BID) | ORAL | Status: DC
  Administered 2022-01-02 – 2022-01-09 (×13): 1300 mg via ORAL
  Filled 2022-01-02 (×17): qty 2

## 2022-01-02 MED ORDER — METOPROLOL TARTRATE 25 MG PO TABS
25.0000 mg | ORAL_TABLET | Freq: Two times a day (BID) | ORAL | Status: DC
  Administered 2022-01-02 – 2022-01-03 (×3): 25 mg via ORAL
  Filled 2022-01-02 (×3): qty 1

## 2022-01-02 MED ORDER — ALBUMIN HUMAN 25 % IV SOLN
25.0000 g | Freq: Two times a day (BID) | INTRAVENOUS | Status: DC
  Administered 2022-01-02 – 2022-01-05 (×7): 25 g via INTRAVENOUS
  Filled 2022-01-02 (×8): qty 100

## 2022-01-02 MED ORDER — GABAPENTIN 100 MG PO CAPS
100.0000 mg | ORAL_CAPSULE | Freq: Three times a day (TID) | ORAL | Status: DC
Start: 2022-01-02 — End: 2022-01-29
  Administered 2022-01-02 – 2022-01-29 (×76): 100 mg via ORAL
  Filled 2022-01-02 (×76): qty 1

## 2022-01-02 MED ORDER — MORPHINE SULFATE (PF) 2 MG/ML IV SOLN
1.0000 mg | INTRAVENOUS | Status: DC | PRN
  Administered 2022-01-02: 1 mg via INTRAVENOUS
  Filled 2022-01-02: qty 1

## 2022-01-02 MED ORDER — SODIUM BICARBONATE 8.4 % IV SOLN
INTRAVENOUS | Status: DC
  Filled 2022-01-02: qty 150

## 2022-01-02 MED ORDER — OXYCODONE HCL 5 MG PO TABS
5.0000 mg | ORAL_TABLET | ORAL | Status: DC | PRN
  Administered 2022-01-02 – 2022-01-05 (×8): 10 mg via ORAL
  Administered 2022-01-06 (×2): 5 mg via ORAL
  Administered 2022-01-07 – 2022-01-08 (×4): 10 mg via ORAL
  Administered 2022-01-10 – 2022-01-11 (×3): 5 mg via ORAL
  Administered 2022-01-11: 10 mg via ORAL
  Administered 2022-01-12: 5 mg via ORAL
  Administered 2022-01-12: 10 mg via ORAL
  Administered 2022-01-13 (×2): 5 mg via ORAL
  Administered 2022-01-14 (×2): 10 mg via ORAL
  Administered 2022-01-14 – 2022-01-17 (×2): 5 mg via ORAL
  Administered 2022-01-19 (×3): 10 mg via ORAL
  Administered 2022-01-20 – 2022-01-21 (×5): 5 mg via ORAL
  Administered 2022-01-22 – 2022-01-25 (×6): 10 mg via ORAL
  Administered 2022-01-26: 5 mg via ORAL
  Filled 2022-01-02 (×2): qty 2
  Filled 2022-01-02 (×2): qty 1
  Filled 2022-01-02 (×3): qty 2
  Filled 2022-01-02: qty 1
  Filled 2022-01-02 (×2): qty 2
  Filled 2022-01-02 (×2): qty 1
  Filled 2022-01-02: qty 2
  Filled 2022-01-02: qty 1
  Filled 2022-01-02: qty 2
  Filled 2022-01-02: qty 1
  Filled 2022-01-02 (×5): qty 2
  Filled 2022-01-02: qty 1
  Filled 2022-01-02 (×5): qty 2
  Filled 2022-01-02: qty 1
  Filled 2022-01-02: qty 2
  Filled 2022-01-02: qty 1
  Filled 2022-01-02: qty 2
  Filled 2022-01-02: qty 1
  Filled 2022-01-02: qty 2
  Filled 2022-01-02: qty 1
  Filled 2022-01-02 (×2): qty 2
  Filled 2022-01-02: qty 1
  Filled 2022-01-02 (×3): qty 2

## 2022-01-02 MED ORDER — MORPHINE SULFATE (PF) 2 MG/ML IV SOLN
2.0000 mg | INTRAVENOUS | Status: DC | PRN
  Administered 2022-01-02 – 2022-01-23 (×14): 2 mg via INTRAVENOUS
  Filled 2022-01-02 (×12): qty 1

## 2022-01-02 MED ORDER — SODIUM CHLORIDE 0.9 % IV SOLN: INTRAVENOUS | Status: DC

## 2022-01-02 NOTE — Progress Notes (Signed)
[place] [facility] Day(s): 1.   Post op day(s): 1 Day Post-Op.   Interval History:  Patient seen and examined No acute events or new complaints overnight.  Patient reports significant pain in her buttock; 10/10 overnight requiring IV medications No fever, chills No new labs this morning Wound Cx growing GPC & GPR She continues on Cefepime, Vancomycin Wound vac in place; WOC on board for changes starting Monday 06/05  Vital signs in last 24 hours: [min-max] current  Temp:  [97.7 F (36.5 C)-99.4 F (37.4 C)] 98.5 F (36.9 C) (06/02 0042) Pulse Rate:  [60-121] 108 (06/02 0042) Resp:  [11-25] 18 (06/02 0042) BP: (124-158)/(55-95) 145/75 (06/02 0042) SpO2:  [93 %-100 %] 100 % (06/02 0042)     Height: 5\' 3"  (160 cm) Weight: 97 kg BMI (Calculated): 37.89   Intake/Output last 2 shifts:  06/01 0701 - 06/02 0700 In: 1200 [I.V.:700; Blood:300; IV Piggyback:200] Out: 315 [Urine:300; Blood:15]   Physical Exam:  Constitutional: alert, cooperative and no distress  Respiratory: breathing non-labored at rest  Cardiovascular: regular rate and sinus rhythm  Integumentary: Wound vac to the bilateral buttock, bridged to the left anterior hip. This has a good seal this morning. Minimal to no drainage in canister  Labs:     Latest Ref Rng & Units 01/01/2022    2:46 PM 01/01/2022    7:52 AM 01/01/2022    2:02 AM  CBC  WBC 4.0 - 10.5 K/uL  13.8   17.6    Hemoglobin 12.0 - 15.0 g/dL 8.5   6.6   6.3    Hematocrit 36.0 - 46.0 % 25.0   20.5   19.9    Platelets 150 - 400 K/uL  335   410        Latest Ref Rng & Units 01/01/2022    2:46 PM 01/01/2022    1:33 PM 01/01/2022    2:02 AM  CMP  Glucose 70 - 99 mg/dL 157   168   243    BUN 8 - 23 mg/dL 88   95   98    Creatinine 0.44 - 1.00 mg/dL 4.80   4.48   4.77    Sodium 135 - 145 mmol/L 133   133   132    Potassium 3.5 - 5.1 mmol/L 4.6   4.5   5.1    Chloride 98 - 111 mmol/L 103   103   105    CO2 22 - 32  mmol/L  17   17    Calcium 8.9 - 10.3 mg/dL  8.1   8.0    Total Protein 6.5 - 8.1 g/dL   6.3    Total Bilirubin 0.3 - 1.2 mg/dL   0.7    Alkaline Phos 38 - 126 U/L   212    AST 15 - 41 U/L   182    ALT 0 - 44 U/L   107       Imaging studies: No new pertinent imaging studies   Assessment/Plan: 66 y.o. female 1 Day Post-Op s/p debridement of three decubitus wounds of bilateral buttocks (total area 133 cm2) and placement of negative pressure dressing (> 50 cm).    - Continue wound vac; change MWF; [place] RN on-board to assist with changes starting Monday 06/05, greatly appreciate their assistance  - Continue IV Abx (Cefepime, Vancomycin); Cx from OR growing Chunky            -  Local wound care; pressure offload, frequent repositioning, low air loss mattress            - Pain control prn            - Further management per primary service; we will follow   All of the above findings and recommendations were discussed with the patient, and the medical team, and all of patient's questions were answered to her expressed satisfaction.  -- Edison Simon, PA-C [facility] 01/02/2022, 7:12 AM M-F: 7am - 4pm

## 2022-01-02 NOTE — Plan of Care (Signed)
  Problem: Fluid Volume: Goal: Hemodynamic stability will improve Outcome: Progressing   Problem: Clinical Measurements: Goal: Diagnostic test results will improve Outcome: Progressing Goal: Signs and symptoms of infection will decrease Outcome: Progressing   Problem: Respiratory: Goal: Ability to maintain adequate ventilation will improve Outcome: Progressing   Problem: Education: Goal: Ability to describe self-care measures that may prevent or decrease complications (Diabetes Survival Skills Education) will improve Outcome: Progressing Goal: Individualized Educational Video(s) Outcome: Progressing   Problem: Coping: Goal: Ability to adjust to condition or change in health will improve Outcome: Progressing   Problem: Fluid Volume: Goal: Ability to maintain a balanced intake and output will improve Outcome: Progressing   Problem: Health Behavior/Discharge Planning: Goal: Ability to identify and utilize available resources and services will improve Outcome: Progressing Goal: Ability to manage health-related needs will improve Outcome: Progressing   Problem: Metabolic: Goal: Ability to maintain appropriate glucose levels will improve Outcome: Progressing   Problem: Nutritional: Goal: Maintenance of adequate nutrition will improve Outcome: Progressing Goal: Progress toward achieving an optimal weight will improve Outcome: Progressing   Problem: Skin Integrity: Goal: Risk for impaired skin integrity will decrease Outcome: Progressing   Problem: Tissue Perfusion: Goal: Adequacy of tissue perfusion will improve Outcome: Progressing   

## 2022-01-02 NOTE — Progress Notes (Signed)
Cross Cover Previously ordered as needed morphine continued for uncontrolled, 10/10, surgical site pain.

## 2022-01-02 NOTE — Progress Notes (Signed)
PT Cancellation Note  Patient Details Name: Theresa Warren MRN: [id number] DOB: Nov 04, 1955   Cancelled Treatment:    Reason Eval/Treat Not Completed: Medical issues which prohibited therapy;Pain limiting ability to participate (Chart reviewed, evaluation deferred at this time. Pain is 10/10 limiting tolerance and motivation, HR in upper 120s, not appropriate to engage in exertional activity at this time.) Will attempt again at later date/time.   10:07 AM, 01/02/22 Etta Grandchild, PT, DPT Physical Therapist - [facility]  [phone] ([place])    [name] 01/02/2022, 10:07 AM

## 2022-01-02 NOTE — Consult Note (Signed)
Cardiology Consultation:   Patient ID: Theresa Warren MRN: [id number]; DOB: 30-May-1956  Admit date: 12/31/2021 Date of Consult: 01/02/2022  PCP:  Crist Infante, MD   [facility] Providers Cardiologist:  Kathlyn Sacramento, MD   Click here to update MD or APP on Care Team, Refresh:1}     Patient Profile:   Theresa Warren is a 66 y.o. female with a hx of essential hypertension, hyperlipidemia, diabetes, and chronic kidney disease stage V who is being seen 01/02/2022 for the evaluation of new onset atrial fibrillation with RVR at the request of Dr. Arbutus Ped.   History of Present Illness:   [name] presented to the emergency department from [facility] after having low sodium values on her blood work. She does endorse decreased urine output from her normal as she has long standing history of CKD V but she does continue to make urine. She stated the the rehab facility has placed her on a fluid restriction. This comes after she had a discharge summary dated on 12/23/2021 where she was admitted for fall and found to have anemia secondary to vaginal bleeding, pressure injury to her sacrum, and acute on chronic kidney injury. She denies any chest pain, palpitations, or shortness of breath. Cardiology was consulted due to new onset atrial fibrillation RVR. She was seen in 2016 and evaluated for shortness of breath with lexiscan stress testing that resulted with an overall impression of a normal stress nuclear study.   Initial vitals in the emergency department showed a heart rate of 107, blood pressure 142/66, RR 18, SpO2 99% on room air, and a temp of 99.6.  Pertinent labs: Sodium 129, potassium 5.1, chloride 99, bicarb 17, BUN 100, creatinine 4.61, GFR 10, WBC 18.4, hgb 6.9, hct 22.1, and platelets of 473  Imaging: CT abdomen pelvis shows bladder distention, suspected urinary retention, abnormal low density endometrial thickening; RUQ abdominal ultrasound revealed unremarkable right  upper quadrant   Past Medical History:  Diagnosis Date   CKD (chronic kidney disease), stage III (HCC)    Diabetes ([place])    Hyperlipidemia    Hypertension    Obesity     Past Surgical History:  Procedure Laterality Date   ACHILLES TENDON REPAIR     ANKLE SURGERY     INCISION AND DRAINAGE ABSCESS Bilateral 01/01/2022   Procedure: INCISION AND DRAINAGE ABSCESS-Sacral Decubitus;  Surgeon: Olean Ree, MD;  Location: [facility] ORS;  Service: General;  Laterality: Bilateral;   URETHRAL STRICTURE DILATATION       Home Medications:  Prior to Admission medications   Medication Sig Start Date End Date Taking? Authorizing Provider  allopurinol (ZYLOPRIM) 100 MG tablet Take 100 mg by mouth daily.    [provider]  atorvastatin (LIPITOR) 40 MG tablet Take 40 mg by mouth daily.    [provider]  fexofenadine (ALLEGRA) 180 MG tablet Take 180 mg by mouth daily.    [provider]  fluticasone (FLONASE) 50 MCG/ACT nasal spray Place 2 sprays into both nostrils daily. 11/27/14 12/19/21  Vilinda Boehringer, MD  furosemide (LASIX) 20 MG tablet Take 20 mg by mouth daily.    [provider]  HUMULIN R 500 UNIT/ML SOLN injection Inject 25-30 Units into the skin 2 (two) times daily with a meal. Patient reports she uses Humulin R U500 vials and U100 insulin syringe. Draws up to 30 unit mark on U100 syringe QAM, Humulin R U500 25 unit mark on U100 syringe QPM 07/10/14   [provider]  losartan (COZAAR) 50 MG tablet Take 1 tablet (50 mg total) by mouth daily. 12/24/21   Sharen Hones, MD  metoprolol tartrate (LOPRESSOR) 25 MG tablet Take 1 tablet (25 mg total) by mouth 2 (two) times daily. 12/23/21   Sharen Hones, MD  nystatin (MYCOSTATIN/NYSTOP) powder Apply topically. 10/28/21   [provider]  Omega-3 Fatty Acids (FISH OIL) 500 MG CAPS Take by mouth.    [provider]  sodium bicarbonate 650 MG tablet Take 1 tablet (650 mg total) by mouth 2 (two) times  daily. 12/23/21 12/23/22  Sharen Hones, MD  triamcinolone cream (KENALOG) 0.1 % Apply 1 application. topically 3 (three) times daily. 11/12/21   [provider]  Vitamin D, Ergocalciferol, (DRISDOL) 50000 UNITS CAPS capsule Take 1 capsule by mouth 2 (two) times a week. 07/22/14   [provider]    Inpatient Medications: Scheduled Meds:  vitamin C  500 mg Oral BID   atorvastatin  40 mg Oral Daily   fluticasone  2 spray Each Nare Daily   furosemide  60 mg Intravenous BID   gabapentin  100 mg Oral TID   insulin aspart  0-5 Units Subcutaneous QHS   insulin aspart  0-9 Units Subcutaneous TID WC   insulin glargine-yfgn  20 Units Subcutaneous BID   loratadine  10 mg Oral Daily   metoprolol tartrate  25 mg Oral BID   multivitamin with minerals  1 tablet Oral Daily   nutrition supplement (JUVEN)  1 packet Oral BID BM   Ensure Max Protein  11 oz Oral QHS   zinc sulfate  220 mg Oral Daily   Continuous Infusions:  albumin human     ceFEPime (MAXIPIME) IV Stopped (01/01/22 2333)   sodium bicarbonate 150 mEq in D5W infusion     vancomycin Stopped (01/01/22 1129)   PRN Meds: acetaminophen **OR** acetaminophen, hydrALAZINE, hydrALAZINE, morphine injection, ondansetron **OR** ondansetron (ZOFRAN) IV, oxyCODONE  Allergies:    Allergies  Allergen Reactions   Codeine Nausea And Vomiting   Sulfa Antibiotics Swelling   Clindamycin/Lincomycin Rash   Penicillins Rash    Social History:   Social History   Socioeconomic History   Marital status: Divorced    Spouse name: Not on file   Number of children: Not on file   Years of education: Not on file   Highest education level: Not on file  Occupational History   Occupation: Works [employer]  Tobacco Use   Smoking status: Never   Smokeless tobacco: Not on file  Substance and Sexual Activity   Alcohol use: Not Currently   Drug use: Not Currently   Sexual activity: Not Currently  Other Topics Concern   Not on file   Social History Narrative   Lives alone   Social Determinants of Health   Financial Resource Strain: Not on file  Food Insecurity: Not on file  Transportation Needs: Not on file  Physical Activity: Not on file  Stress: Not on file  Social Connections: Not on file  Intimate Partner Violence: Not on file    Family History:    Family History  Problem Relation Age of Onset   Alzheimer's disease Father    Diabetes Father    CAD Father 39   CAD Other        Multliple maternal aunts and uncles with early onset heart disease   Lung cancer Sister    ALS Mother      ROS:  Please see the history of present illness.  Review of Systems  Constitutional:  Positive for malaise/fatigue.  HENT: Negative.    Eyes: Negative.   Respiratory:  Positive for shortness of breath.        Chronic  Cardiovascular:  Positive for orthopnea and leg swelling.  Gastrointestinal:  Positive for abdominal pain and diarrhea.  Musculoskeletal:  Positive for falls.  Skin: Negative.   Neurological:  Positive for weakness.  Endo/Heme/Allergies:  Bruises/bleeds easily.  Psychiatric/Behavioral: Negative.     All other ROS reviewed and negative.     Physical Exam/Data:   Vitals:   01/02/22 0042 01/02/22 0817 01/02/22 1028 01/02/22 1147  BP: (!) 145/75 (!) 154/74 (!) 158/72 (!) 159/101  Pulse: (!) 108 (!) 127 (!) 132 (!) 108  Resp: 18 18 18 20   Temp: 98.5 F (36.9 C) 99.9 F (37.7 C) 99.2 F (37.3 C) 99.3 F (37.4 C)  TempSrc:    Oral  SpO2: 100% 97% 98%   Weight:      Height:        Intake/Output Summary (Last 24 hours) at 01/02/2022 1446 Last data filed at 01/02/2022 6010 Gross per 24 hour  Intake 1200 ml  Output 315 ml  Net 885 ml      12/31/2021    6:24 PM 12/22/2021    5:00 AM 12/21/2021    4:53 AM  Last 3 Weights  Weight (lbs) 213 lb 13.5 oz 213 lb 13.5 oz 208 lb 15.9 oz  Weight (kg) 97 kg 97 kg 94.8 kg     Body mass index is 37.88 kg/m.  General:   Pale, ill-appearing in no acute  distress HEENT: normal Neck: no JVD appreciated due to body habitus Vascular: No carotid bruits; Distal pulses 2+ bilaterally Cardiac:  normal S1, S2; tachycardiac, irregularly irregular; I/VI systolic murmur  Lungs:  clear to auscultation with diminished bases  bilaterally, no wheezing, rhonchi or rales, respirations are unlabored on room air Abd: soft, nontender, no hepatomegaly, obese, bowel sounds present in all 4 quadrants  Ext: 2+ pitting edema to bilateral lower extremities Musculoskeletal:  No deformities, BUE and BLE strength normal and equal Skin: warm and dry  Neuro:  CNs 2-12 intact, no focal abnormalities noted Psych:  Normal affect   EKG:  The EKG was personally reviewed and demonstrates: today's study has not been released for viewing Telemetry:  Telemetry was personally reviewed and demonstrates: ST varying with rate controlled atrial fibrillation with rates of 108 and artifact  Relevant CV Studies: Myocardial Perfusion Study completed 09/2014 Exercise Capacity:  Lexiscan with no exercise. BP Response:  Normal blood pressure response. Clinical Symptoms:  No chest pain or dyspnea. ECG Impression:  No significant ST segment change suggestive of ischemia. Comparison with Prior Nuclear Study: No previous nuclear study performed   Overall Impression:  Normal stress nuclear study.   LV Wall Motion:  NL LV Function; NL Wall Motion  Laboratory Data:  High Sensitivity Troponin:   Recent Labs  Lab 12/18/21 1213 12/18/21 1837  TROPONINIHS 20* 21*     Chemistry Recent Labs  Lab 01/01/22 0202 01/01/22 0752 01/01/22 1333 01/01/22 1446 01/02/22 0703  NA 132*  --  133* 133* 136  K 5.1  --  4.5 4.6 4.7  CL 105  --  103 103 108  CO2 17*  --  17*  --  17*  GLUCOSE 243*  --  168* 157* 100*  BUN 98*  --  95* 88* 89*  CREATININE 4.77*  --  4.48* 4.80* 4.12*  CALCIUM  8.0*  --  8.1*  --  8.6*  MG  --  1.0* 2.0  --  1.7  GFRNONAA 10*  --  10*  --  12*  ANIONGAP 10  --  13   --  11    Recent Labs  Lab 12/31/21 1826 01/01/22 0202 01/02/22 0703  PROT 7.0 6.3* 6.5  ALBUMIN 2.3* 2.1* 2.0*  AST 192* 182* 65*  ALT 116* 107* 67*  ALKPHOS 229* 212* 175*  BILITOT 0.5 0.7 0.9   Lipids No results for input(s): CHOL, TRIG, HDL, LABVLDL, LDLCALC, CHOLHDL in the last 168 hours.  Hematology Recent Labs  Lab 01/01/22 0202 01/01/22 0752 01/01/22 1446 01/02/22 0703  WBC 17.6* 13.8*  --  16.4*  RBC 2.04* 2.17*  --  2.81*  HGB 6.3* 6.6* 8.5* 8.5*  HCT 19.9* 20.5* 25.0* 25.9*  MCV 97.5 94.5  --  92.2  MCH 30.9 30.4  --  30.2  MCHC 31.7 32.2  --  32.8  RDW 13.4 13.5  --  15.9*  PLT 410* 335  --  405*   Thyroid No results for input(s): TSH, FREET4 in the last 168 hours.  BNPNo results for input(s): BNP, PROBNP in the last 168 hours.  DDimer No results for input(s): DDIMER in the last 168 hours.   Radiology/Studies:  CT ABDOMEN PELVIS WO CONTRAST  Result Date: 01/01/2022 CLINICAL DATA:  66 year old female with acute abdominal pain. Decreased p.o. intake and urine output. EXAM: CT ABDOMEN AND PELVIS WITHOUT CONTRAST TECHNIQUE: Multidetector CT imaging of the abdomen and pelvis was performed following the standard protocol without IV contrast. RADIATION DOSE REDUCTION: This exam was performed according to the departmental dose-optimization program which includes automated exposure control, adjustment of the mA and/or kV according to patient size and/or use of iterative reconstruction technique. COMPARISON:  Pelvis ultrasound 12/19/2021. FINDINGS: Lower chest: Borderline to mild cardiomegaly and minor atelectasis at the lung bases. No pericardial or pleural effusion. Hepatobiliary: Negative noncontrast liver and gallbladder. Pancreas: Fatty atrophy. Spleen: Fairly circumscribed 2.5 cm low-density area in the medial spleen appears to be solitary on this noncontrast exam (series 2, image 21) and is most likely benign such as hemangioma (no follow-up imaging recommended).  Adrenals/Urinary Tract: Normal adrenal glands. Nonobstructed kidneys. No nephrolithiasis. Numerous small bilateral renal cysts (no follow-up imaging recommended). Both ureters are decompressed to the bladder. Bladder distension estimated at 520 mL. No bladder wall thickening or perivesical inflammation identified. Stomach/Bowel: Large bowel retained stool. Occasional diverticula in the sigmoid colon. Oral contrast has reached the hepatic flexure. No large bowel inflammation. Normal appendix on series 2, image 68. Negative terminal ileum. No dilated small bowel. Decompressed stomach. Negative duodenum. No free air, free fluid, or convincing mesenteric inflammation. Vascular/Lymphatic: Widespread calcified atherosclerosis. Calcified aortic atherosclerosis. Normal caliber abdominal aorta. No lymphadenopathy identified. Reproductive: Abnormal low-density endometrial thickening for age (series 2, image 41 and sagittal image 54). Small calcified fundal fibroid. Ovaries are within normal limits. Other: No pelvic free fluid. Musculoskeletal: Chronic appearing right 7th rib fracture. No acute osseous abnormality identified. Bilateral flank body wall edema is partially visible and greater on the right (series 2, image 73). IMPRESSION: 1. Abnormal low-density endometrial thickening for a postmenopausal patient. Endometrial sampling is indicated to exclude carcinoma. If results are benign, sonohysterogram should be considered for focal lesion work-up. (Ref: Radiological Reasoning: Algorithmic Workup of Abnormal Vaginal Bleeding with Endovaginal Sonography and Sonohysterography. AJR 2008; [id number]:F62-13). 2. Bladder distension estimated at 520 mL. Query urinary retention. 3. No other acute  or inflammatory process identified in the noncontrast abdomen or pelvis. Aortic Atherosclerosis (ICD10-I70.0). Electronically Signed   By: Genevie Ann M.D.   On: 01/01/2022 04:19   DG Chest Port 1 View  Result Date: 01/01/2022 CLINICAL DATA:   Hyponatremia EXAM: PORTABLE CHEST 1 VIEW COMPARISON:  12/18/2021 FINDINGS: Lungs are clear.  No pleural effusion or pneumothorax. The heart is normal in size. IMPRESSION: No evidence of acute cardiopulmonary disease. Electronically Signed   By: Julian Hy M.D.   On: 01/01/2022 00:03   US Abdomen Limited RUQ (LIVER/GB)  Result Date: 01/01/2022 CLINICAL DATA:  Transaminitis EXAM: ULTRASOUND ABDOMEN LIMITED RIGHT UPPER QUADRANT COMPARISON:  CT today FINDINGS: Gallbladder: No gallstones or wall thickening visualized. No sonographic Murphy sign noted by sonographer. Common bile duct: Diameter: Normal caliber, 4 mm Liver: No focal lesion identified. Within normal limits in parenchymal echogenicity. Portal vein is patent on color Doppler imaging with normal direction of blood flow towards the liver. Other: None. IMPRESSION: Unremarkable right upper quadrant ultrasound. Electronically Signed   By: Rolm Baptise M.D.   On: 01/01/2022 19:09     Assessment and Plan:   New onset Atrial Fibrillation with RVR  - In the setting of severe sepsis, symptomatic anemia, fever, tachycardia, and CKD V - Given 5 mg of IVP lopressor - Currently rate controlled - Echocardiogram ordered  - Continue metoprolol 25 mg bid - Recommend starting heparin drip for CHADSVASc of 3 (female, HTN, DM) for stroke prevention from paroxysmal atrial fibrillation, will consider transitioning to oral anticoagulant prior to discharge - Daily CBC while on heparin - Continue cardiac monitor  2. Essential Hypertension - Continue metoprolol 25 mg bid - Continue PRN hydralazine - Losartan currently on hold for acute on chronic AKI - Vital sign per unit protocol  3. Hyponatremia - now resolved - sodium on admission was 129 now 136 - daily BMP - Continue IVF per primary team  4. Severe sepsis - Continue IV antibiotics per primary team - MRSA PCR negative - sacral wound with would vac intact - Blood and wound cultures pending -  Management per primary team  5. Acute on chronic kidney injury - Continue hydration - Avoid nephrotoxins - Daily BMP - Nephrology following  6. 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 The patient's score is based upon: CHF History: 0 HTN History: 1 Diabetes History: 1 Stroke History: 0 Vascular Disease History: 0 Age Score: 0 Gender Score: 1         For questions or updates, please contact [place] Please consult [email or web address] for contact info under    Signed, Indra Wolters, NP  01/02/2022 2:46 PM

## 2022-01-02 NOTE — Progress Notes (Signed)
   01/02/22 0817  Assess: MEWS Score  Temp 99.9 F (37.7 C)  BP (!) 154/74  MAP (mmHg) 92  Pulse Rate (!) 127  Resp 18  SpO2 97 %  O2 Device Room Air  Assess: MEWS Score  MEWS Temp 0  MEWS Systolic 0  MEWS Pulse 2  MEWS RR 0  MEWS LOC 0  MEWS Score 2  MEWS Score Color Yellow  Assess: if the MEWS score is Yellow or Red  Were vital signs taken at a resting state? Yes  Focused Assessment Change from prior assessment (see assessment flowsheet)  Does the patient meet 2 or more of the SIRS criteria? No  MEWS guidelines implemented *See Row Information* Yes  Treat  MEWS Interventions Other (Comment) (Notified MD of HR)  Pain Scale 0-10  Pain Score 9  Pain Intervention(s) MD notified (Comment)  Take Vital Signs  Increase Vital Sign Frequency  Yellow: Q 2hr X 2 then Q 4hr X 2, if remains yellow, continue Q 4hrs  Escalate  MEWS: Escalate Yellow: discuss with charge nurse/RN and consider discussing with provider and RRT  Notify: Charge Nurse/RN  Name of Charge Nurse/RN Notified [place]  Date Charge Nurse/RN Notified 01/02/22  Time Charge Nurse/RN Notified 0820  Notify: Provider  Provider Name/Title Darlyne Russian  Date Provider Notified 01/02/22  Time Provider Notified 0815  Method of Notification  (secure chat)  Notification Reason Change in status (Yellow [place] HR)  Provider response See new orders  Date of Provider Response 01/02/22  Time of Provider Response 0830  Document  Patient Outcome Stabilized after interventions;Other (Comment) (Continue to mnitor)  Assess: SIRS CRITERIA  SIRS Temperature  0  SIRS Pulse 1  SIRS Respirations  0  SIRS WBC 0  SIRS Score Sum  1

## 2022-01-02 NOTE — Progress Notes (Addendum)
Progress Note   Patient: Theresa Warren YYT:[id number] DOB: Jan 22, 1956 DOA: 12/31/2021     1 DOS: the patient was seen and examined on 01/02/2022   Brief hospital course: [name] is a 66 year old female hyperlipidemia, hypertension, CKD stage V, insulin-dependent diabetes mellitus, who presents emergency department from [facility] for evaluation of low sodium noted on labs at the facility.  She was discharged to rehab on 5/23 after being admitted following a fall and also with acute on chronic anemia.  ED Course - temp 99.6, HR 107, otherwise unremarkable vitals. Labs with sodium 129, potassium 5.1, chloride 99, bicarb 17, BUN of 100, serum creatinine of 4.61, GFR of 10, WBC 18.4, hemoglobin 6.9, platelets of 473.  LFT's were also elevated.  Patient met criteria for sepsis on arrival, treated per protocol in the ED with IV Cefepime, Flagyl, Vanc, and LR 1 L bolus fluids.  Infection source unclear but felt possibly due to sacral wound infection and concern for underlying osteomyelitis.  Admitted to the hospitalist service for further evaluation and management.  Continued on IV Cefepime and Vanc.   General surgery was consulted for debridement on sacral pressure injury.  Nephrology consulted due to worsening renal function.   Assessment and Plan: * Severe sepsis ([place]) POA as evidenced by tachycardia, leukocytosis, AKI and transaminitis consistent with organ dysfunction and severe sepsis with suspected infection source being sacral wounds versus an abdominal source (CT abdomen pelvis negative for anything acute however).  MRSA PCR negative.  Wound tissue cultures growing few GPC's and GPR's  --Continue IV cefepime, vancomycin pending further culture data -- Continue IV fluids for now -- Follow blood and wound cultures -- Monitor for signs and symptoms of other infection -- Monitor wounds closely  Type 2 diabetes mellitus with chronic kidney disease, with long-term  current use of insulin (HCC) Sliding scale NovoLog. Semglee 20 units twice daily. Adjust insulin for inpatient goal 140-180  DNR (do not resuscitate)/DNI(Do Not Intubate) Admitting hospitalist confirmed DNR CODE STATUS with patient during admission encounter  Essential hypertension Losartan held due to AKI. As needed hydralazine for now.  Chronic kidney disease, stage 5 (HCC) See AKI.  Nephrology following.  New onset a-fib [facility]) Patient went into rapid A-fib today 6/2.  Earlier in the morning was sinus tach with PACs, notified by Central telemetry she had converted to A-fib.  HR on telemetry in the 130s, improved to 108 with 5 mg IV Lopressor. -- Start oral Lopressor 25 mg twice daily -- Continue monitoring telemetry -- Cardiology consult -- Given anemia requiring transfusions, will hold off anticoagulation for now, or consider heparin drip  Hyponatremia Resolved.  Presented with sodium 129.  This was noted on labs at [facility] and was the reason patient presented to the ED. improved with IV hydration. --Continue IV fluids -- Monitor BMP --Monitor volume status closely  NA: 136   AKI (acute kidney injury) ([place]) Superimposed on CKD stage V.  Suspect prerenal secondary to poor p.o. intake versus intrarenal secondary to sepsis.  Treated with 1 L bolus LR in the ED.  In chart review, appears discussions have been initiated regarding dialysis. -- Consult nephrology --Continue IV fluids for now - Daily BMP -- Avoid nephrotoxins, hypotension -- Renally dose meds  Abdominal pain Patient reported on admission.  CT of abdomen pelvis showed abnormal endometrial thickening for a postmenopausal patient, bladder distention estimated 520 mL, no other acute or inflammatory processes or acute findings. --Monitor clinically for now -- Needs OB/GYN follow-up for  endometrial sampling to exclude endometrial carcinoma  Swelling of left lower extremity With redness, patient attributes it to  clindamycin.  She reported on admission that this has been present for over one month.   Left lower extremity Doppler US on 12/18/21 was negative for DVT.  Monitor closely for now.  Sacral wound, initial encounter See pressure injury  Elevated LFTs On admission, alk phos 212, AST 182, ALT 107.  Unclear etiology, repeat LFTs not available today.  Appears LFTs were elevated at time of discharge on 5/23 but have increased since. --Right upper quadrant ultrasound -- Acute hepatitis panel -- Follow CMP  Symptomatic anemia Hemoglobin on admission was 6.9 >> 6.3, down from 7.6 at time of discharge on 5/23.  No signs of active bleeding, did have vaginal/endometrial bleeding previous admission but no reports of this recently.  May have slow chronic blood loss from sacral wounds.  Has baseline anemia of chronic disease due to renal failure. Received 1 unit PRBC transfusion on admission. -- Transfusing another unit RBCs today -- Trend hemoglobin and transfuse if less than 7  Pressure injury of skin Present on admission.  Patient has multiple sacral pressure injuries.  Some concern at time of admission that these were the source of sepsis. General surgery consulted and took patient to the OR on 6/1 for debridement.   Now with wound VAC. --Initial wound VAC change Monday 6/5 then every M/W/F -- Wound care consulted -- Frequent repositioning --Follow general surgery recommendations --Pain control --Follow blood cultures Pressure Injury 01/01/22 Buttocks Left Stage 3 -  Full thickness tissue loss. Subcutaneous fat may be visible but bone, tendon or muscle are NOT exposed. (Active)  01/01/22 0222  Location: Buttocks  Location Orientation: Left  Staging: Stage 3 -  Full thickness tissue loss. Subcutaneous fat may be visible but bone, tendon or muscle are NOT exposed.  Wound Description (Comments):   Present on Admission: Yes     Pressure Injury 01/01/22 Buttocks Right Stage 2 -  Partial thickness  loss of dermis presenting as a shallow open injury with a red, pink wound bed without slough. (Active)  01/01/22 0223  Location: Buttocks  Location Orientation: Right  Staging: Stage 2 -  Partial thickness loss of dermis presenting as a shallow open injury with a red, pink wound bed without slough.  Wound Description (Comments):   Present on Admission: Yes     Pressure Injury 01/01/22 Hip Left Stage 2 -  Partial thickness loss of dermis presenting as a shallow open injury with a red, pink wound bed without slough. (Active)  01/01/22 0225  Location: Hip  Location Orientation: Left  Staging: Stage 2 -  Partial thickness loss of dermis presenting as a shallow open injury with a red, pink wound bed without slough.  Wound Description (Comments):   Present on Admission: Yes     Pressure Injury 01/01/22 Hip Right Stage 3 -  Full thickness tissue loss. Subcutaneous fat may be visible but bone, tendon or muscle are NOT exposed. (Active)  01/01/22 0225  Location: Hip  Location Orientation: Right  Staging: Stage 3 -  Full thickness tissue loss. Subcutaneous fat may be visible but bone, tendon or muscle are NOT exposed.  Wound Description (Comments):   Present on Admission: Yes      OSA (obstructive sleep apnea) .  Appears not to be on CPAP        Subjective: Patient was awake resting in bed when seen on rounds today.  She has been  tachycardic and went into A-fib today.  She denies chest discomfort, palpitations, dizziness or lightheadedness.  Denies history of A-fib that she knows of.  She was speaking on the phone with someone telling them how she does not want to return to any nursing home.  Earlier this morning, patient was having significantly uncontrolled pain but feeling better at the time of my encounter.  Physical Exam: Vitals:   01/02/22 0042 01/02/22 0817 01/02/22 1028 01/02/22 1147  BP: (!) 145/75 (!) 154/74 (!) 158/72 (!) 159/101  Pulse: (!) 108 (!) 127 (!) 132 (!) 108   Resp: 18 18 18 20   Temp: 98.5 F (36.9 C) 99.9 F (37.7 C) 99.2 F (37.3 C) 99.3 F (37.4 C)  TempSrc:    Oral  SpO2: 100% 97% 98%   Weight:      Height:       General exam: awake, alert, no acute distress, obese HEENT: Hearing grossly normal, clear conjunctiva, moist mucous members Respiratory system: Lungs clear bilaterally without wheezes or rhonchi, normal respiratory effort Cardiovascular system: Irregularly irregular, 2-3+ lower extremity edema bilaterally.   Gastrointestinal system: soft, nontender nondistended abdomen Central nervous system: A&O x3. no gross focal neurologic deficits, normal speech Extremities: Pitting bilateral lower extremity edema and venous stasis changes, distal left lower extremity with mild erythema Psychiatry: normal mood, congruent affect, judgement and insight appear normal   Data Reviewed:  Notable labs: Bicarb 17, glucose 100, BUN 89, creatinine 4.12 down from 4.80, calcium 8.6 with albumin 2.0, alk phos 175, AST 65, ALT 67, GFR 12, WBC 16.4, hemoglobin 8.5 stable, platelets 405.  Urinalysis negative nitrite, negative leukocytes, 0-5 WBC and rare bacteria  Right upper quadrant yesterday ultrasound unremarkable  Family Communication: None  Disposition: Status is: Inpatient Remains inpatient appropriate because: Severity of illness with anemia requiring transfusion, requiring IV therapies including pain control, new onset A-fib with RVR.   Planned Discharge Destination: Skilled nursing facility    Time spent: 45 minutes   Author: Ezekiel Slocumb, DO 01/02/2022 1:55 PM  For on call review [email or web address].

## 2022-01-02 NOTE — Consult Note (Signed)
WOC Nurse Consult Note:  Reason for Consult: NPWT to three pressure injuries, Stage 3, to bilateral buttocks and sacrum following surgical debridement by Dr. Hampton Abbot. Surgeon requests first NPWT dressing change to be Monday, 6/5. Wound type: Pressure Pressure Injury POA: Yes   WOC Nurse will change first NPWT dressing on MON, 6/5 and M/W/F thereafter.  [facility] nursing team will follow, and will remain available to this patient, the nursing and medical teams.    Thank you for involving [place] in this patient's POC.  Maudie Flakes, MSN, RN, [place], Ar[facility]  Pager# [phone]

## 2022-01-02 NOTE — Assessment & Plan Note (Addendum)
Patient became significantly tachycardic on 6/2 with heart rates sustaining in the 130s.  Cardiology reviewed telemetry and feels patient dysrhythmias were not A-fib but sinus tach with PACs. --Cardiology following -- Continue Lopressor 25 mg BID

## 2022-01-02 NOTE — Progress Notes (Addendum)
[facility]  ROUNDING NOTE   Subjective:   Theresa Warren is a 66 y.o. female with past medical history of hypertension, hyperlipidemia, diabetes, and chronic kidney disease stage V. Patient presents to ed for abnormal labs. She has been admitted for Hyponatremia [E87.1] AKI (acute kidney injury) ([place]) [N17.9] Symptomatic anemia [D64.9] Anemia, unspecified type [D64.9] Severe sepsis ([place]) [A41.9, R65.20]  Patient is known to our clinic from previous admissions. She is followed by Dr Arty Baumgartner in [place] at [facility]. She states she recently was seen in office and advised to come to hospital for abnormal sodium labs. Patient states she was at [facility] receiving rehab, able to ambulate with assistance. She denies weakness, loss of appetite, or nausea and vomiting. Reports pain from sacral wound. Denies shortness of breath. States nephrology was preparing for vein mapping to prepare for dialysis in the future. She continues to make urine.   Labs on ED arrival include sodium 129, potassium 5.1, bicarb 17, glucose 181, calcium 8.3, and creatinine 4.61 with GFR 10. Albumin 2.3, WBC 18.4 and Hgb 6.9.  Chest x-ray shows no acute changes.  CT abdomen pelvis shows bladder distention, suspected urinary retention.  Recent labs show corrected sodium 136, bicarb remains 17, creatinine 4.12 with GFR 12, albumin 2.0, WBC 16.4 and hemoglobin 8.5.  We have been consulted to manage acute kidney injury.   Objective:  Vital signs in last 24 hours:  Temp:  [97.7 F (36.5 C)-99.9 F (37.7 C)] 99.3 F (37.4 C) (06/02 1147) Pulse Rate:  [95-132] 108 (06/02 1147) Resp:  [11-25] 20 (06/02 1147) BP: (124-159)/(55-101) 159/101 (06/02 1147) SpO2:  [93 %-100 %] 98 % (06/02 1028)  Weight change:  Filed Weights   12/31/21 1824  Weight: 97 kg    Intake/Output: I/O last 3 completed shifts: In: 1614.2 [I.V.:700; Blood:614.2; IV Piggyback:300] Out: 315 [Urine:300; Blood:15]    Intake/Output this shift:  No intake/output data recorded.  Physical Exam: General: NAD, restless  Head: Normocephalic, atraumatic. Moist oral mucosal membranes  Eyes: Anicteric  Lungs:  Clear to auscultation, normal effort, room air  Heart: Regular rhythm, tachycardia  Abdomen:  Soft, nontender, obese  Extremities: 2+ peripheral edema.  Neurologic: Nonfocal, moving all four extremities  Skin: No lesions, sacral wound (NPWV)  Access: None    Basic Metabolic Panel: Recent Labs  Lab 12/31/21 1826 01/01/22 0202 01/01/22 0752 01/01/22 1333 01/01/22 1446 01/02/22 0703  NA 129* 132*  --  133* 133* 136  K 5.1 5.1  --  4.5 4.6 4.7  CL 99 105  --  103 103 108  CO2 17* 17*  --  17*  --  17*  GLUCOSE 181* 243*  --  168* 157* 100*  BUN 100* 98*  --  95* 88* 89*  CREATININE 4.61* 4.77*  --  4.48* 4.80* 4.12*  CALCIUM 8.3* 8.0*  --  8.1*  --  8.6*  MG  --   --  1.0* 2.0  --  1.7    Liver Function Tests: Recent Labs  Lab 12/31/21 1826 01/01/22 0202 01/02/22 0703  AST 192* 182* 65*  ALT 116* 107* 67*  ALKPHOS 229* 212* 175*  BILITOT 0.5 0.7 0.9  PROT 7.0 6.3* 6.5  ALBUMIN 2.3* 2.1* 2.0*   No results for input(s): LIPASE, AMYLASE in the last 168 hours. No results for input(s): AMMONIA in the last 168 hours.  CBC: Recent Labs  Lab 12/31/21 1826 01/01/22 0202 01/01/22 0752 01/01/22 1446 01/02/22 0703  WBC 18.4*  17.6* 13.8*  --  16.4*  NEUTROABS  --   --  11.1*  --   --   HGB 6.9* 6.3* 6.6* 8.5* 8.5*  HCT 22.1* 19.9* 20.5* 25.0* 25.9*  MCV 98.2 97.5 94.5  --  92.2  PLT 473* 410* 335  --  405*    Cardiac Enzymes: No results for input(s): CKTOTAL, CKMB, CKMBINDEX, TROPONINI in the last 168 hours.  BNP: Invalid input(s): POCBNP  CBG: Recent Labs  Lab 01/01/22 1356 01/01/22 1657 01/01/22 2159 01/02/22 0825 01/02/22 1149  GLUCAP 162* 164* 152* 90 118*    Microbiology: Results for orders placed or performed during the hospital encounter of 12/31/21   Blood culture (routine x 2)     Status: None (Preliminary result)   Collection Time: 12/31/21 11:35 PM   Specimen: BLOOD  Result Value Ref Range Status   Specimen Description BLOOD RIGHT ARM  Final   Special Requests   Final    BOTTLES DRAWN AEROBIC AND ANAEROBIC Blood Culture adequate volume   Culture   Final    NO GROWTH 1 DAY Performed at [facility], [address] [place], [place] [address]    Report Status PENDING  Incomplete  Blood culture (routine x 2)     Status: None (Preliminary result)   Collection Time: 12/31/21 11:49 PM   Specimen: BLOOD  Result Value Ref Range Status   Specimen Description BLOOD RIGHT HAND  Final   Special Requests   Final    BOTTLES DRAWN AEROBIC AND ANAEROBIC Blood Culture adequate volume   Culture   Final    NO GROWTH 1 DAY Performed at [facility], [address] [place], [place] [address]    Report Status PENDING  Incomplete  MRSA Next Gen by PCR, Nasal     Status: None   Collection Time: 01/01/22 10:17 AM   Specimen: Nasal Mucosa; Nasal Swab  Result Value Ref Range Status   MRSA by PCR Next Gen NOT DETECTED NOT DETECTED Final    Comment: (NOTE) The GeneXpert MRSA Assay (FDA approved for NASAL specimens only), is one component of a comprehensive MRSA colonization surveillance program. It is not intended to diagnose MRSA infection nor to guide or monitor treatment for MRSA infections. Test performance is not FDA approved in patients less than 28 years old. Performed at [facility], [place], [place] [address]   Aerobic/Anaerobic Culture w Gram Stain (surgical/deep wound)     Status: None (Preliminary result)   Collection Time: 01/01/22  3:37 PM   Specimen: PATH Soft tissue  Result Value Ref Range Status   Specimen Description TISSUE  Final   Special Requests BUTTOCKS WOUND  Final   Gram Stain   Final    NO SQUAMOUS EPITHELIAL CELLS SEEN FEW WBC SEEN RARE GRAM POSITIVE RODS FEW GRAM  POSITIVE COCCI    Culture   Final    TOO YOUNG TO READ Performed at [facility], [place] [address] [place], [place] [address]    Report Status PENDING  Incomplete  Aerobic/Anaerobic Culture w Gram Stain (surgical/deep wound)     Status: None (Preliminary result)   Collection Time: 01/01/22  3:46 PM   Specimen: PATH Soft tissue  Result Value Ref Range Status   Specimen Description TISSUE  Final   Special Requests BUTTOCKS WOUND NO 2  Final   Gram Stain   Final    NO SQUAMOUS EPITHELIAL CELLS SEEN FEW WBC SEEN FEW GRAM POSITIVE  COCCI    Culture   Final    TOO YOUNG TO READ Performed at [facility], [place] [address], [place], [place] [address]    Report Status PENDING  Incomplete    Coagulation Studies: Recent Labs    12/31/21 2353  LABPROT 16.5*  INR 1.4*    Urinalysis: Recent Labs    01/02/22 0038  COLORURINE YELLOW*  LABSPEC 1.013  PHURINE 5.0  GLUCOSEU NEGATIVE  HGBUR SMALL*  BILIRUBINUR NEGATIVE  KETONESUR NEGATIVE  PROTEINUR 100*  NITRITE NEGATIVE  LEUKOCYTESUR NEGATIVE      Imaging: CT ABDOMEN PELVIS WO CONTRAST  Result Date: 01/01/2022 CLINICAL DATA:  66 year old female with acute abdominal pain. Decreased p.o. intake and urine output. EXAM: CT ABDOMEN AND PELVIS WITHOUT CONTRAST TECHNIQUE: Multidetector CT imaging of the abdomen and pelvis was performed following the standard protocol without IV contrast. RADIATION DOSE REDUCTION: This exam was performed according to the departmental dose-optimization program which includes automated exposure control, adjustment of the mA and/or kV according to patient size and/or use of iterative reconstruction technique. COMPARISON:  Pelvis ultrasound 12/19/2021. FINDINGS: Lower chest: Borderline to mild cardiomegaly and minor atelectasis at the lung bases. No pericardial or pleural effusion. Hepatobiliary: Negative noncontrast liver and gallbladder. Pancreas: Fatty atrophy. Spleen: Fairly circumscribed 2.5 cm  low-density area in the medial spleen appears to be solitary on this noncontrast exam (series 2, image 21) and is most likely benign such as hemangioma (no follow-up imaging recommended). Adrenals/Urinary Tract: Normal adrenal glands. Nonobstructed kidneys. No nephrolithiasis. Numerous small bilateral renal cysts (no follow-up imaging recommended). Both ureters are decompressed to the bladder. Bladder distension estimated at 520 mL. No bladder wall thickening or perivesical inflammation identified. Stomach/Bowel: Large bowel retained stool. Occasional diverticula in the sigmoid colon. Oral contrast has reached the hepatic flexure. No large bowel inflammation. Normal appendix on series 2, image 68. Negative terminal ileum. No dilated small bowel. Decompressed stomach. Negative duodenum. No free air, free fluid, or convincing mesenteric inflammation. Vascular/Lymphatic: Widespread calcified atherosclerosis. Calcified aortic atherosclerosis. Normal caliber abdominal aorta. No lymphadenopathy identified. Reproductive: Abnormal low-density endometrial thickening for age (series 2, image 18 and sagittal image 54). Small calcified fundal fibroid. Ovaries are within normal limits. Other: No pelvic free fluid. Musculoskeletal: Chronic appearing right 7th rib fracture. No acute osseous abnormality identified. Bilateral flank body wall edema is partially visible and greater on the right (series 2, image 73). IMPRESSION: 1. Abnormal low-density endometrial thickening for a postmenopausal patient. Endometrial sampling is indicated to exclude carcinoma. If results are benign, sonohysterogram should be considered for focal lesion work-up. (Ref: Radiological Reasoning: Algorithmic Workup of Abnormal Vaginal Bleeding with Endovaginal Sonography and Sonohysterography. AJR 2008; 735:H29-92). 2. Bladder distension estimated at 520 mL. Query urinary retention. 3. No other acute or inflammatory process identified in the noncontrast  abdomen or pelvis. Aortic Atherosclerosis (ICD10-I70.0). Electronically Signed   By: Genevie Ann M.D.   On: 01/01/2022 04:19   DG Chest Port 1 View  Result Date: 01/01/2022 CLINICAL DATA:  Hyponatremia EXAM: PORTABLE CHEST 1 VIEW COMPARISON:  12/18/2021 FINDINGS: Lungs are clear.  No pleural effusion or pneumothorax. The heart is normal in size. IMPRESSION: No evidence of acute cardiopulmonary disease. Electronically Signed   By: Julian Hy M.D.   On: 01/01/2022 00:03   US Abdomen Limited RUQ (LIVER/GB)  Result Date: 01/01/2022 CLINICAL DATA:  Transaminitis EXAM: ULTRASOUND ABDOMEN LIMITED RIGHT UPPER QUADRANT COMPARISON:  CT today FINDINGS: Gallbladder: No gallstones or wall thickening visualized. No sonographic Murphy sign noted  by sonographer. Common bile duct: Diameter: Normal caliber, 4 mm Liver: No focal lesion identified. Within normal limits in parenchymal echogenicity. Portal vein is patent on color Doppler imaging with normal direction of blood flow towards the liver. Other: None. IMPRESSION: Unremarkable right upper quadrant ultrasound. Electronically Signed   By: Rolm Baptise M.D.   On: 01/01/2022 19:09     Medications:    sodium chloride 75 mL/hr at 01/02/22 1221   albumin human     ceFEPime (MAXIPIME) IV Stopped (01/01/22 2333)   vancomycin Stopped (01/01/22 1129)    vitamin C  500 mg Oral BID   atorvastatin  40 mg Oral Daily   fluticasone  2 spray Each Nare Daily   furosemide  60 mg Intravenous BID   gabapentin  100 mg Oral TID   insulin aspart  0-5 Units Subcutaneous QHS   insulin aspart  0-9 Units Subcutaneous TID WC   insulin glargine-yfgn  20 Units Subcutaneous BID   loratadine  10 mg Oral Daily   multivitamin with minerals  1 tablet Oral Daily   nutrition supplement (JUVEN)  1 packet Oral BID BM   Ensure Max Protein  11 oz Oral QHS   sodium bicarbonate  650 mg Oral BID   zinc sulfate  220 mg Oral Daily   acetaminophen **OR** acetaminophen, hydrALAZINE,  hydrALAZINE, morphine injection, ondansetron **OR** ondansetron (ZOFRAN) IV, oxyCODONE  Assessment/ Plan:  Ms. Cybill Uriegas is a 66 y.o.  female with past medical history of hypertension, hyperlipidemia, diabetes, and chronic kidney disease stage V. Patient presents to ed for abnormal labs. She has been admitted for Hyponatremia [E87.1] AKI (acute kidney injury) ([place]) [N17.9] Symptomatic anemia [D64.9] Anemia, unspecified type [D64.9] Severe sepsis ([place]) [A41.9, R65.20]   Acute Kidney Injury with hyponatremia on chronic kidney disease stage V with baseline creatinine 3.91 and GFR of 12 on 12/23/21.  Acute kidney injury secondary to underlying illness Chronic kidney disease is secondary to diabetes and hypertension No exposure to IV contrast.  CT abdomen pelvis negative for obstruction, several renal stones identified.  Losartan and furosemide  held since admission.  No acute indication for dialysis at this time.  Sodium corrected to 136 with IV fluid.    Lab Results  Component Value Date   CREATININE 4.12 (H) 01/02/2022   CREATININE 4.80 (H) 01/01/2022   CREATININE 4.48 (H) 01/01/2022    Intake/Output Summary (Last 24 hours) at 01/02/2022 1330 Last data filed at 01/02/2022 0648 Gross per 24 hour  Intake 1200 ml  Output 315 ml  Net 885 ml   2. Anemia of chronic kidney disease Lab Results  Component Value Date   HGB 8.5 (L) 01/02/2022  Admission hemoglobin 6.9.  Patient has received blood transfusions during this admission.  No signs of active bleeding.  We will continue to monitor hemoglobin.  3. Secondary Hyperparathyroidism:  Lab Results  Component Value Date   CALCIUM 8.6 (L) 01/02/2022   CAION 1.11 (L) 01/01/2022  Calcium within acceptable range.  We will continue to monitor.  4.  Acute metabolic acidosis.  Bicarb started at 17, prescribed sodium bicarb 650 mg twice daily.  Will increase oral dose to 1300mg  twice daily.  5.  Generalized edema.  Probably from 3rd  spacing of fluid. Albumin 2.0 Home regimen includes furosemide 20 mg daily.  Currently held in setting of kidney injury.  Will order furosemide 60 mg IV twice daily with albumin 25 g to optimize fluid removal.  We will reevaluate tomorrow. Obtain  2 D echo. Suspect right heart dysfunction/pulm HTN    LOS: 1 [place] 6/2/20231:30 PM   Patient was examined and evaluated with Colon Flattery, NP.  Plan of care was formulated and discussed with patient as well as NP.  I agree with the note as documented with edits.

## 2022-01-02 NOTE — Progress Notes (Signed)
Pt states that she feels like she can void but does not want to strain. Pt was able to void 100 ml, bladder scanned volume 412 ml. Provider Randol Kern notified and stated to I& O cath pt . Pt refused I & O cath states that it will be too painful and want to try to void on her own . Provider Randol Kern aware, will continue to monitor call bell within reach.

## 2022-01-02 NOTE — Progress Notes (Signed)
Pt voided 200 ml this a.m . Will continue to monitor.

## 2022-01-02 NOTE — Progress Notes (Signed)
Pharmacy Antibiotic Note  Theresa Warren is a 66 y.o. female admitted on 12/31/2021 with sepsis from unknown source.  Pharmacy has been consulted for Cefepime and Vancomycin dosing for 7 days.  Plan: Cefepime 2 gm q24h per indication & renal fxn.  Continue Vancomycin 500 mg IV Q 36 hrs.  Goal AUC 400-550. Expected AUC: 495.5, Expected Cssmin: 16.0 SCr used: 4.12 Vd used: 0.5, BMI 37.9  Pharmacy will continue to follow and will adjust abx dosing whenever warranted.  Height: 5\' 3"  (160 cm) Weight: 97 kg (213 lb 13.5 oz) IBW/kg (Calculated) : 52.4  Temp (24hrs), Avg:98.7 F (37.1 C), Min:97.7 F (36.5 C), Max:99.9 F (37.7 C)  Recent Labs  Lab 12/31/21 1826 12/31/21 2353 01/01/22 0202 01/01/22 0752 01/01/22 1333 01/01/22 1446 01/02/22 0703  WBC 18.4*  --  17.6* 13.8*  --   --  16.4*  CREATININE 4.61*  --  4.77*  --  4.48* 4.80* 4.12*  LATICACIDVEN  --  1.6 2.7* 1.0  --   --   --      Estimated Creatinine Clearance: 15.3 mL/min (A) (by C-G formula based on SCr of 4.12 mg/dL (H)).    Allergies  Allergen Reactions   Codeine Nausea And Vomiting   Sulfa Antibiotics Swelling   Clindamycin/Lincomycin Rash   Penicillins Rash    Antimicrobials this admission: 6/01 Flagyl >> x 1 dose 6/01 Cefepime >> x 7 days 6/01 Vancomycin >> x 7 days  Microbiology results: 5/31 BCx: NGTD 6/1 wound cx: rare GPR, few GPC  Thank you for allowing pharmacy to be a part of this patient's care.  Pearla Dubonnet, PharmD Clinical Pharmacist 01/02/2022 1:49 PM

## 2022-01-02 NOTE — Progress Notes (Signed)
OT Cancellation Note  Patient Details Name: Theresa Warren MRN: [id number] DOB: 08-Nov-1955   Cancelled Treatment:    Reason Eval/Treat Not Completed: Patient declined, no reason specified The initial OT evaluation attempt. Pt. Reports 10/10 pain in the buttocks. Pt. Adamantly refused, reporting that she "has already been down this road the last time, went to rehab, and landed right back in the hospital worse off that I was." Pt. Education was provided about OT services. Pt. Right to refuse tx were reserved.   Harrel Carina, MS, OTR/L   Harrel Carina 01/02/2022, 9:33 AM

## 2022-01-03 ENCOUNTER — Inpatient Hospital Stay (HOSPITAL_COMMUNITY)
Admit: 2022-01-03 | Discharge: 2022-01-03 | Disposition: A | Discharge status: Home / Self Care | Payer: Medicare Other | Attending: Cardiology | Admitting: Cardiology

## 2022-01-03 DIAGNOSIS — I4891 Unspecified atrial fibrillation: Secondary | ICD-10-CM

## 2022-01-03 DIAGNOSIS — R6520 Severe sepsis without septic shock: Secondary | ICD-10-CM | POA: Diagnosis not present

## 2022-01-03 DIAGNOSIS — A419 Sepsis, unspecified organism: Secondary | ICD-10-CM | POA: Diagnosis not present

## 2022-01-03 LAB — ECHOCARDIOGRAM COMPLETE
AV Peak grad: 7.8 mmHg
Ao pk vel: 1.4 m/s
Area-P 1/2: 4.06 cm2
Calc EF: 63.2 %
Height: 63 in
S' Lateral: 2.37 cm
Single Plane A2C EF: 62.5 %
Single Plane A4C EF: 65.4 %
Weight: 3421.54 oz

## 2022-01-03 LAB — CBC
HCT: 24.6 % — ABNORMAL LOW (ref 36.0–46.0)
Hemoglobin: 8 g/dL — ABNORMAL LOW (ref 12.0–15.0)
MCH: 30.1 pg (ref 26.0–34.0)
MCHC: 32.5 g/dL (ref 30.0–36.0)
MCV: 92.5 fL (ref 80.0–100.0)
Platelets: 360 10*3/uL (ref 150–400)
RBC: 2.66 MIL/uL — ABNORMAL LOW (ref 3.87–5.11)
RDW: 15.8 % — ABNORMAL HIGH (ref 11.5–15.5)
WBC: 13 10*3/uL — ABNORMAL HIGH (ref 4.0–10.5)
nRBC: 0 % (ref 0.0–0.2)

## 2022-01-03 LAB — GLUCOSE, CAPILLARY
Glucose-Capillary: 90 mg/dL (ref 70–99)
Glucose-Capillary: 104 mg/dL — ABNORMAL HIGH (ref 70–99)
Glucose-Capillary: 163 mg/dL — ABNORMAL HIGH (ref 70–99)
Glucose-Capillary: 231 mg/dL — ABNORMAL HIGH (ref 70–99)

## 2022-01-03 LAB — COMPREHENSIVE METABOLIC PANEL
ALT: 93 U/L — ABNORMAL HIGH (ref 0–44)
AST: 184 U/L — ABNORMAL HIGH (ref 15–41)
Albumin: 1.9 g/dL — ABNORMAL LOW (ref 3.5–5.0)
Alkaline Phosphatase: 304 U/L — ABNORMAL HIGH (ref 38–126)
Anion gap: 11 (ref 5–15)
BUN: 94 mg/dL — ABNORMAL HIGH (ref 8–23)
CO2: 19 mmol/L — ABNORMAL LOW (ref 22–32)
Calcium: 8.6 mg/dL — ABNORMAL LOW (ref 8.9–10.3)
Chloride: 105 mmol/L (ref 98–111)
Creatinine, Ser: 3.88 mg/dL — ABNORMAL HIGH (ref 0.44–1.00)
GFR, Estimated: 12 mL/min — ABNORMAL LOW (ref >60)
Glucose, Bld: 92 mg/dL (ref 70–99)
Potassium: 4.6 mmol/L (ref 3.5–5.1)
Sodium: 135 mmol/L (ref 135–145)
Total Bilirubin: 0.6 mg/dL (ref 0.3–1.2)
Total Protein: 5.8 g/dL — ABNORMAL LOW (ref 6.5–8.1)

## 2022-01-03 LAB — MAGNESIUM: Magnesium: 1.6 mg/dL — ABNORMAL LOW (ref 1.7–2.4)

## 2022-01-03 MED ORDER — MAGNESIUM SULFATE 2 GM/50ML IV SOLN
2.0000 g | Freq: Once | INTRAVENOUS | Status: AC
  Administered 2022-01-03: 2 g via INTRAVENOUS
  Filled 2022-01-03: qty 50

## 2022-01-03 MED ORDER — METOPROLOL SUCCINATE ER 25 MG PO TB24
25.0000 mg | ORAL_TABLET | Freq: Two times a day (BID) | ORAL | Status: DC
  Administered 2022-01-03: 25 mg via ORAL
  Filled 2022-01-03: qty 1

## 2022-01-03 NOTE — Progress Notes (Signed)
Cardiology Progress Note   Patient Name: Theresa Warren Date of Encounter: 01/03/2022  Primary Cardiologist: Kathlyn Sacramento, MD  Subjective   As well this morning.  Denies chest pain, dyspnea, or palpitations.  Continues to note lower extremity swelling, which she thinks is improving slightly.  Inpatient Medications    Scheduled Meds:  vitamin C  500 mg Oral BID   atorvastatin  40 mg Oral Daily   fluticasone  2 spray Each Nare Daily   furosemide  60 mg Intravenous BID   gabapentin  100 mg Oral TID   insulin aspart  0-5 Units Subcutaneous QHS   insulin aspart  0-9 Units Subcutaneous TID WC   insulin glargine-yfgn  20 Units Subcutaneous BID   loratadine  10 mg Oral Daily   metoprolol tartrate  25 mg Oral BID   multivitamin with minerals  1 tablet Oral Daily   nutrition supplement (JUVEN)  1 packet Oral BID BM   Ensure Max Protein  11 oz Oral QHS   sodium bicarbonate  1,300 mg Oral BID   zinc sulfate  220 mg Oral Daily   Continuous Infusions:  albumin human 25 g (01/03/22 1058)   ceFEPime (MAXIPIME) IV 2 g (01/02/22 2243)   vancomycin 500 mg (01/02/22 2348)   PRN Meds: acetaminophen **OR** acetaminophen, hydrALAZINE, hydrALAZINE, morphine injection, ondansetron **OR** ondansetron (ZOFRAN) IV, oxyCODONE   Vital Signs    Vitals:   01/03/22 0118 01/03/22 0510 01/03/22 0753 01/03/22 1306  BP: 137/70 129/62 (!) 124/53 (!) 165/73  Pulse: 95 83 79 94  Resp: 18 16 18 18   Temp: 98.2 F (36.8 C) 98.6 F (37 C) 98.2 F (36.8 C) 98.8 F (37.1 C)  TempSrc:   Oral Oral  SpO2: 98% 98% 98% 100%  Weight:      Height:        Intake/Output Summary (Last 24 hours) at 01/03/2022 1442 Last data filed at 01/03/2022 1300 Gross per 24 hour  Intake 615.43 ml  Output 1050 ml  Net -434.57 ml   Filed Weights   12/31/21 1824  Weight: 97 kg    Physical Exam   GEN: Obese, in no acute distress.  HEENT: Grossly normal.  Neck: Supple, no JVD, carotid bruits, or  masses. Cardiac: Irregular, 2/6 systolic murmur at the upper sternal borders, no rubs or gallops. No clubbing, cyanosis, 1+ bilateral lower extremity edema.  Radials 2+, DP/PT 1+ and equal bilaterally.  Respiratory:  Respirations regular and unlabored, clear to auscultation bilaterally. GI: Obese, soft, nontender, nondistended, BS + x 4. MS: no deformity or atrophy. Skin: warm and dry, no rash. Neuro:  Strength and sensation are intact. Psych: AAOx3.  Normal affect.  Labs    Chemistry Recent Labs  Lab 01/01/22 0202 01/01/22 1333 01/01/22 1446 01/02/22 0703 01/03/22 0613  NA 132* 133* 133* 136 135  K 5.1 4.5 4.6 4.7 4.6  CL 105 103 103 108 105  CO2 17* 17*  --  17* 19*  GLUCOSE 243* 168* 157* 100* 92  BUN 98* 95* 88* 89* 94*  CREATININE 4.77* 4.48* 4.80* 4.12* 3.88*  CALCIUM 8.0* 8.1*  --  8.6* 8.6*  PROT 6.3*  --   --  6.5 5.8*  ALBUMIN 2.1*  --   --  2.0* 1.9*  AST 182*  --   --  65* 184*  ALT 107*  --   --  67* 93*  ALKPHOS 212*  --   --  175* 304*  BILITOT 0.7  --   --  0.9 0.6  GFRNONAA 10* 10*  --  12* 12*  ANIONGAP 10 13  --  11 11     Hematology Recent Labs  Lab 01/01/22 0752 01/01/22 1446 01/02/22 0703 01/03/22 0613  WBC 13.8*  --  16.4* 13.0*  RBC 2.17*  --  2.81* 2.66*  HGB 6.6* 8.5* 8.5* 8.0*  HCT 20.5* 25.0* 25.9* 24.6*  MCV 94.5  --  92.2 92.5  MCH 30.4  --  30.2 30.1  MCHC 32.2  --  32.8 32.5  RDW 13.5  --  15.9* 15.8*  PLT 335  --  405* 360    Cardiac Enzymes  Recent Labs  Lab 12/18/21 1213 12/18/21 1837  TROPONINIHS 20* 21*     HbA1c  Lab Results  Component Value Date   HGBA1C 10.0 (H) 12/18/2021    Radiology    CT ABDOMEN PELVIS WO CONTRAST  Result Date: 01/01/2022 CLINICAL DATA:  66 year old female with acute abdominal pain. Decreased p.o. intake and urine output. EXAM: CT ABDOMEN AND PELVIS WITHOUT CONTRAST TECHNIQUE: Multidetector CT imaging of the abdomen and pelvis was performed following the standard protocol without IV  contrast. RADIATION DOSE REDUCTION: This exam was performed according to the departmental dose-optimization program which includes automated exposure control, adjustment of the mA and/or kV according to patient size and/or use of iterative reconstruction technique. COMPARISON:  Pelvis ultrasound 12/19/2021. FINDINGS: Lower chest: Borderline to mild cardiomegaly and minor atelectasis at the lung bases. No pericardial or pleural effusion. Hepatobiliary: Negative noncontrast liver and gallbladder. Pancreas: Fatty atrophy. Spleen: Fairly circumscribed 2.5 cm low-density area in the medial spleen appears to be solitary on this noncontrast exam (series 2, image 21) and is most likely benign such as hemangioma (no follow-up imaging recommended). Adrenals/Urinary Tract: Normal adrenal glands. Nonobstructed kidneys. No nephrolithiasis. Numerous small bilateral renal cysts (no follow-up imaging recommended). Both ureters are decompressed to the bladder. Bladder distension estimated at 520 mL. No bladder wall thickening or perivesical inflammation identified. Stomach/Bowel: Large bowel retained stool. Occasional diverticula in the sigmoid colon. Oral contrast has reached the hepatic flexure. No large bowel inflammation. Normal appendix on series 2, image 68. Negative terminal ileum. No dilated small bowel. Decompressed stomach. Negative duodenum. No free air, free fluid, or convincing mesenteric inflammation. Vascular/Lymphatic: Widespread calcified atherosclerosis. Calcified aortic atherosclerosis. Normal caliber abdominal aorta. No lymphadenopathy identified. Reproductive: Abnormal low-density endometrial thickening for age (series 2, image 27 and sagittal image 54). Small calcified fundal fibroid. Ovaries are within normal limits. Other: No pelvic free fluid. Musculoskeletal: Chronic appearing right 7th rib fracture. No acute osseous abnormality identified. Bilateral flank body wall edema is partially visible and greater on  the right (series 2, image 73). IMPRESSION: 1. Abnormal low-density endometrial thickening for a postmenopausal patient. Endometrial sampling is indicated to exclude carcinoma. If results are benign, sonohysterogram should be considered for focal lesion work-up. (Ref: Radiological Reasoning: Algorithmic Workup of Abnormal Vaginal Bleeding with Endovaginal Sonography and Sonohysterography. AJR 2008; 119:J47-82). 2. Bladder distension estimated at 520 mL. Query urinary retention. 3. No other acute or inflammatory process identified in the noncontrast abdomen or pelvis. Aortic Atherosclerosis (ICD10-I70.0). Electronically Signed   By: Genevie Ann M.D.   On: 01/01/2022 04:19   DG Chest Port 1 View  Result Date: 01/01/2022 CLINICAL DATA:  Hyponatremia EXAM: PORTABLE CHEST 1 VIEW COMPARISON:  12/18/2021 FINDINGS: Lungs are clear.  No pleural effusion or pneumothorax. The heart is normal in size. IMPRESSION: No evidence of acute cardiopulmonary disease. Electronically Signed   By: Bertis Ruddy  [place] Pink M.D.   On: 01/01/2022 00:03   US Abdomen Limited RUQ (LIVER/GB)  Result Date: 01/01/2022 CLINICAL DATA:  Transaminitis EXAM: ULTRASOUND ABDOMEN LIMITED RIGHT UPPER QUADRANT COMPARISON:  CT today FINDINGS: Gallbladder: No gallstones or wall thickening visualized. No sonographic Murphy sign noted by sonographer. Common bile duct: Diameter: Normal caliber, 4 mm Liver: No focal lesion identified. Within normal limits in parenchymal echogenicity. Portal vein is patent on color Doppler imaging with normal direction of blood flow towards the liver. Other: None. IMPRESSION: Unremarkable right upper quadrant ultrasound. Electronically Signed   By: Rolm Baptise M.D.   On: 01/01/2022 19:09    Telemetry    Sinus rhythm with sinus tachycardia with frequent PACs.  Review of telemetry from June 2, during periods of tachycardia with rates into the 130s suggest multifocal atrial tachycardia - Personally Reviewed  Cardiac Studies   2d  echo pending 6.3.2023  Patient Profile     66 y.o. female with a hx of essential hypertension, hyperlipidemia, diabetes, and CKD V, who was admitted from rehab on 6/1 due to hyponatremia, and found to be in rapid Afib.  Assessment & Plan    1.  Tachycardia: Patient with tachycardia since admission with rates elevating into the 130s on June 2, concerning for atrial fibrillation.  She was seen by our team with recommendation for rate control with beta-blocker therapy.  CHA2DS2VASc = 3, but poor candidate for OAC 2/2 severe anemia.  Upon review of telemetry from June 2, its not clear to me that she had atrial fibrillation, as strips appear to be more consistent with sinus and freq PACs vs multifocal atrial tachycardia with multiple P wave morphologies.  Today, she is sinus rhythm to sinus tach with frequent PACs.  We will change metoprolol to succinate-25 mg twice daily and titrate as necessary for rate and blood pressure management.  2.  Symptomatic anemia:  Hgb down to 6.3 on admission.  Prior admission w/ vaginal bleeding. CT this admission w/ endometrial thickening.  S/p PRBC's on 6/3.  H/H 8.0/24.6 this AM.  Per IM.    3.  DMII:  glucose improved this AM.  Per IM.  4.  Essential HTN:  Pressures variable.  Continue beta-blocker and adjust as necessary.  5.  AKI/CKD V:  Creat improving with IV diuresis.  Nephrology following.  6.  Sepsis:  hemodynamically stable.  Abx per IM.  7.  Chronic HFpEF:  Echo pending.  Responding well to IV diuresis.  -500 mL yesterday and already -880 today.  Diuretic management per nephrology.  Signed, Murray Hodgkins, NP  01/03/2022, 2:42 PM    For questions or updates, please contact   Please consult [email or web address] for contact info under Cardiology/STEMI.

## 2022-01-03 NOTE — Assessment & Plan Note (Addendum)
Resolved with replacement on 6/3. Monitor and replace Mg as needed.

## 2022-01-03 NOTE — Evaluation (Signed)
Physical Therapy Evaluation Patient Details Name: Theresa Warren MRN: [id number] DOB: 1955/10/29 Today's Date: 01/03/2022  History of Present Illness  [name] is a 66 year old female admitted for evaluation of low sodium noted on labs at the facility.  Pt was discharged  to rehab on 5/23 after being admitted following a fall. PMH significant for acute on chronic anemia. hyperlipidemia, hypertension, CKD stage V, insulin-dependent diabetes mellitus    Clinical Impression  Pt received supine in bed upon arrival to room and agreeable to therapy.  Therapist co-treated with OT for safety and management of wound-vac and other lines/leads.  Pt required modA +2 for transferring supine to sit, CGA +2 for STS from bed, and transferring from bed to chair.  Pt left in chair with all needs met and communicated mobility status to the nurse.  Pt limited by pain throughout session and has to be re-directed in conversation at times.  Pt also given extensive education on the risks of staying in bed during hospital stay and outside once d/c.  Current discharge plans to SNF are appropriate at this time.  Pt will continue to benefit from skilled therapy in order to address deficits listed below.         Recommendations for follow up therapy are one component of a multi-disciplinary discharge planning process, led by the attending physician.  Recommendations may be updated based on patient status, additional functional criteria and insurance authorization.  Follow Up Recommendations Skilled nursing-short term rehab (<3 hours/day)    Assistance Recommended at Discharge Frequent or constant Supervision/Assistance  Patient can return home with the following  A little help with walking and/or transfers;A lot of help with bathing/dressing/bathroom;Assistance with cooking/housework;Help with stairs or ramp for entrance;Assist for transportation    Equipment Recommendations    Recommendations for  Other Services       Functional Status Assessment Patient has had a recent decline in their functional status and demonstrates the ability to make significant improvements in function in a reasonable and predictable amount of time.     Precautions / Restrictions Precautions Precautions: Fall Restrictions Weight Bearing Restrictions: No      Mobility  Bed Mobility Overal bed mobility: Needs Assistance Bed Mobility: Sit to Supine     Supine to sit: Mod assist, HOB elevated     General bed mobility comments: step by step vcs    Transfers Overall transfer level: Needs assistance Equipment used: Rolling walker (2 wheels) Transfers: Sit to/from Stand Sit to Stand: Min guard           General transfer comment: pt able to take a few steps to the recliner for transfer and offloading of the wounds.    Ambulation/Gait                  Stairs            Wheelchair Mobility    Modified Rankin (Stroke Patients Only)       Balance Overall balance assessment: Needs assistance Sitting-balance support: No upper extremity supported, Feet supported Sitting balance-Leahy Scale: Good     Standing balance support: Bilateral upper extremity supported Standing balance-Leahy Scale: Fair                               Pertinent Vitals/Pain Pain Assessment Pain Assessment: Faces Faces Pain Scale: Hurts even more Pain Location: buttocks Pain Descriptors / Indicators: Discomfort, Grimacing Pain Intervention(s): Limited activity within patient's  tolerance, Monitored during session, Premedicated before session, Repositioned    Home Living Family/patient expects to be discharged to:: Private residence Living Arrangements: Alone   Type of Home: House Home Access: Stairs to enter Entrance Stairs-Rails: Right Entrance Stairs-Number of Steps: 4-5   Home Layout: Two level;Able to live on main level with bedroom/bathroom Home Equipment: Cane - single  point      Prior Function Prior Level of Function : Independent/Modified Independent;Driving             Mobility Comments: Pt reports she was amb with RW around STR with MOD I; prior to admission prior to fall waas amb with cane, +fall history ADLs Comments: assist for ADLs in rehab, was trialing use of AE; prior to previous admission was indep in ADL/IADL per pt report     Hand Dominance        Extremity/Trunk Assessment   Upper Extremity Assessment Upper Extremity Assessment: Generalized weakness    Lower Extremity Assessment Lower Extremity Assessment: Generalized weakness       Communication   Communication: No difficulties  Cognition                                                General Comments General comments (skin integrity, edema, etc.): +wound vac on buttocks with multiple lacerations on legs.  MD also noting redness of the L LE and may be checking with Ultrasound for DVT, but cleared therapy to work with pt.    Exercises     Assessment/Plan    PT Assessment Patient needs continued PT services  PT Problem List Decreased strength;Pain;Cardiopulmonary status limiting activity;Decreased activity tolerance;Decreased knowledge of use of DME;Decreased balance;Decreased mobility;Decreased skin integrity       PT Treatment Interventions DME instruction;Therapeutic exercise;Gait training;Balance training;Stair training;Neuromuscular re-education;Functional mobility training;Therapeutic activities;Patient/family education;Modalities    PT Goals (Current goals can be found in the Care Plan section)  Acute Rehab PT Goals Patient Stated Goal: to go home PT Goal Formulation: With patient Time For Goal Achievement: 01/03/22 Potential to Achieve Goals: Good    Frequency Min 2X/week     Co-evaluation PT/OT/SLP Co-Evaluation/Treatment: Yes Reason for Co-Treatment: For patient/therapist safety;To address functional/ADL transfers PT goals  addressed during session: Mobility/safety with mobility OT goals addressed during session: ADL's and self-care       AM-PAC PT "6 Clicks" Mobility  Outcome Measure Help needed turning from your back to your side while in a flat bed without using bedrails?: A Lot Help needed moving from lying on your back to sitting on the side of a flat bed without using bedrails?: A Lot Help needed moving to and from a bed to a chair (including a wheelchair)?: A Little Help needed standing up from a chair using your arms (e.g., wheelchair or bedside chair)?: A Little Help needed to walk in hospital room?: A Lot Help needed climbing 3-5 steps with a railing? : A Lot 6 Click Score: 14    End of Session Equipment Utilized During Treatment: Gait belt Activity Tolerance: Patient tolerated treatment well Patient left: in chair;with call bell/phone within reach;with chair alarm set Nurse Communication: Mobility status PT Visit Diagnosis: Unsteadiness on feet (R26.81);Muscle weakness (generalized) (M62.81);Difficulty in walking, not elsewhere classified (R26.2)    Time: 7672-0947 PT Time Calculation (min) (ACUTE ONLY): 35 min   Charges:   PT Evaluation $PT Eval Low  Complexity: 1 Low PT Treatments $Therapeutic Activity: 8-22 mins        Gwenlyn Saran, PT, DPT 01/03/22, 1:51 PM   Christie Nottingham 01/03/2022, 1:47 PM

## 2022-01-03 NOTE — Progress Notes (Signed)
Progress Note   Patient: Theresa Warren OMB:[id number] DOB: 03-30-1956 DOA: 12/31/2021     2 DOS: the patient was seen and examined on 01/03/2022   Brief hospital course: [name] is a 66 year old female hyperlipidemia, hypertension, CKD stage V, insulin-dependent diabetes mellitus, who presents emergency department from [facility] for evaluation of low sodium noted on labs at the facility.  She was discharged to rehab on 5/23 after being admitted following a fall and also with acute on chronic anemia.  ED Course - temp 99.6, HR 107, otherwise unremarkable vitals. Labs with sodium 129, potassium 5.1, chloride 99, bicarb 17, BUN of 100, serum creatinine of 4.61, GFR of 10, WBC 18.4, hemoglobin 6.9, platelets of 473.  LFT's were also elevated.  Patient met criteria for sepsis on arrival, treated per protocol in the ED with IV Cefepime, Flagyl, Vanc, and LR 1 L bolus fluids.  Infection source unclear but felt possibly due to sacral wound infection and concern for underlying osteomyelitis.  Admitted to the hospitalist service for further evaluation and management.  Continued on IV Cefepime and Vanc.   General surgery was consulted for debridement on sacral pressure injury.  Nephrology consulted due to worsening renal function.   Assessment and Plan: * Severe sepsis ([place]) POA as evidenced by tachycardia, leukocytosis, AKI and transaminitis consistent with organ dysfunction and severe sepsis with suspected infection source being sacral wounds versus an abdominal source (CT abdomen pelvis negative for anything acute however).  MRSA PCR negative.  Wound tissue cultures growing few GPC's and GPR's  --Continue IV cefepime, vancomycin pending further culture data -- Continue IV fluids for now -- Follow blood and wound cultures -- Monitor for signs and symptoms of other infection -- Monitor wounds closely  Type 2 diabetes mellitus with chronic kidney disease, with long-term  current use of insulin (HCC) Sliding scale NovoLog. Semglee 20 units twice daily. Adjust insulin for inpatient goal 140-180  DNR (do not resuscitate)/DNI(Do Not Intubate) Admitting hospitalist confirmed DNR CODE STATUS with patient during admission encounter  Essential hypertension Losartan held due to AKI. As needed hydralazine for now.  Chronic kidney disease, stage 5 (HCC) See AKI.  Nephrology following.  New onset a-fib [facility]) Patient went into rapid A-fib today 6/2.  Earlier in the morning was sinus tach with PACs, notified by Central telemetry she had converted to A-fib.  HR on telemetry in the 130s, improved to 108 with 5 mg IV Lopressor. -- Start oral Lopressor 25 mg twice daily -- Continue monitoring telemetry -- Cardiology consult -- Given anemia requiring transfusions, will hold off anticoagulation for now, or consider heparin drip  Hyponatremia Resolved.  Presented with sodium 129.  This was noted on labs at [facility] and was the reason patient presented to the ED. improved with IV hydration. --Continue IV fluids -- Monitor BMP --Monitor volume status closely  NA: 136   AKI (acute kidney injury) ([place]) Superimposed on CKD stage V.  Suspect prerenal secondary to poor p.o. intake versus intrarenal secondary to sepsis.  Treated with 1 L bolus LR in the ED.  In chart review, appears discussions have been initiated regarding dialysis. -- Consult nephrology --Continue IV fluids for now - Daily BMP -- Avoid nephrotoxins, hypotension -- Renally dose meds  Abdominal pain Patient reported on admission.  CT of abdomen pelvis showed abnormal endometrial thickening for a postmenopausal patient, bladder distention estimated 520 mL, no other acute or inflammatory processes or acute findings. --Monitor clinically for now -- Needs OB/GYN follow-up for  endometrial sampling to exclude endometrial carcinoma  Swelling of left lower extremity With redness, patient attributes it to  clindamycin.  She reported on admission that this has been present for over one month.   Left lower extremity Doppler US on 12/18/21 was negative for DVT.  Monitor closely for now.  Sacral wound, initial encounter See pressure injury  Elevated LFTs On admission, alk phos 212, AST 182, ALT 107.  Unclear etiology, repeat LFTs not available today.  Appears LFTs were elevated at time of discharge on 5/23 but have increased since. --Right upper quadrant ultrasound -- Acute hepatitis panel -- Follow CMP  Symptomatic anemia Hemoglobin on admission was 6.9 >> 6.3, down from 7.6 at time of discharge on 5/23.  No signs of active bleeding, did have vaginal/endometrial bleeding previous admission but no reports of this recently.  May have slow chronic blood loss from sacral wounds.  Has baseline anemia of chronic disease due to renal failure. Received 1 unit PRBC transfusion on admission. -- Transfusing another unit RBCs today -- Trend hemoglobin and transfuse if less than 7  Pressure injury of skin Present on admission.  Patient has multiple sacral pressure injuries.  Some concern at time of admission that these were the source of sepsis. General surgery consulted and took patient to the OR on 6/1 for debridement.   Now with wound VAC. --Initial wound VAC change Monday 6/5 then every M/W/F -- Wound care consulted -- Frequent repositioning --Follow general surgery recommendations --Pain control --Follow blood cultures Pressure Injury 01/01/22 Buttocks Left Stage 3 -  Full thickness tissue loss. Subcutaneous fat may be visible but bone, tendon or muscle are NOT exposed. (Active)  01/01/22 0222  Location: Buttocks  Location Orientation: Left  Staging: Stage 3 -  Full thickness tissue loss. Subcutaneous fat may be visible but bone, tendon or muscle are NOT exposed.  Wound Description (Comments):   Present on Admission: Yes     Pressure Injury 01/01/22 Buttocks Right Stage 2 -  Partial thickness  loss of dermis presenting as a shallow open injury with a red, pink wound bed without slough. (Active)  01/01/22 0223  Location: Buttocks  Location Orientation: Right  Staging: Stage 2 -  Partial thickness loss of dermis presenting as a shallow open injury with a red, pink wound bed without slough.  Wound Description (Comments):   Present on Admission: Yes     Pressure Injury 01/01/22 Hip Left Stage 2 -  Partial thickness loss of dermis presenting as a shallow open injury with a red, pink wound bed without slough. (Active)  01/01/22 0225  Location: Hip  Location Orientation: Left  Staging: Stage 2 -  Partial thickness loss of dermis presenting as a shallow open injury with a red, pink wound bed without slough.  Wound Description (Comments):   Present on Admission: Yes     Pressure Injury 01/01/22 Hip Right Stage 3 -  Full thickness tissue loss. Subcutaneous fat may be visible but bone, tendon or muscle are NOT exposed. (Active)  01/01/22 0225  Location: Hip  Location Orientation: Right  Staging: Stage 3 -  Full thickness tissue loss. Subcutaneous fat may be visible but bone, tendon or muscle are NOT exposed.  Wound Description (Comments):   Present on Admission: Yes      Hypomagnesemia Mg 1.6 this morning (6/3). Replacing with 2 g IV Mg sulfate. Monitor replace Mg as needed   OSA (obstructive sleep apnea) .  Appears not to be on CPAP  Subjective: Patient was starting to work with PT and OT on my arrival to the room on rounds.  She reports feeling okay, still having a lot of pain.  Denies fevers chills.  She confirms that her left lower extremity is always more swollen than the right due to prior injury and hardware in that ankle.  Physical Exam: Vitals:   01/03/22 0118 01/03/22 0510 01/03/22 0753 01/03/22 1306  BP: 137/70 129/62 (!) 124/53 (!) 165/73  Pulse: 95 83 79 94  Resp: 18 16 18 18   Temp: 98.2 F (36.8 C) 98.6 F (37 C) 98.2 F (36.8 C) 98.8 F (37.1  C)  TempSrc:   Oral Oral  SpO2: 98% 98% 98% 100%  Weight:      Height:       General exam: awake, alert, no acute distress, obese HEENT: Hearing grossly normal, clear conjunctiva, moist mucous members Respiratory system: Lungs clear bilaterally without wheezes or rhonchi, normal respiratory effort Cardiovascular system: Irregular rhythm, regular rate, right greater than left lower extremity edema.   Central nervous system: A&O x3. no gross focal neurologic deficits, normal speech Extremities: Pitting bilateral lower extremity edema and venous stasis changes, distal left lower extremity with mild erythema Skin: Dry intact normal temperature, scattered ecchymosis patches left distal lower extremity with mild erythema Psychiatry: normal mood, congruent affect, judgement and insight appear normal   Data Reviewed:  Notable labs: Bicarb 19, BUN 94, creatinine improved 2.88, calcium 8.6 with albumin 1.9, magnesium 1.6, AST rising 184, ALT rising 93, total protein 5.8, GFR 12, WBCs improved 13.0, hemoglobin 8.0  Echocardiogram pending  Family Communication: None  Disposition: Status is: Inpatient Remains inpatient appropriate because: Severity of illness with anemia requiring transfusion, requiring IV therapies including pain control, on IV antibiotics pending cultures, new onset A-fib with RVR.   Planned Discharge Destination: Skilled nursing facility    Time spent: 35 minutes   Author: Ezekiel Slocumb, DO 01/03/2022 2:48 PM  For on call review [email or web address].

## 2022-01-03 NOTE — Progress Notes (Signed)
*  PRELIMINARY RESULTS* Echocardiogram 2D Echocardiogram has been performed.  Theresa Warren 01/03/2022, 8:50 AM

## 2022-01-03 NOTE — Progress Notes (Signed)
[facility]  ROUNDING NOTE   Subjective:   Erisha Paugh is a 66 y.o. female with past medical history of hypertension, hyperlipidemia, diabetes, and chronic kidney disease stage V. Patient presents to ed for abnormal labs. She has been admitted for Hyponatremia [E87.1] AKI (acute kidney injury) ([place]) [N17.9] Symptomatic anemia [D64.9] Anemia, unspecified type [D64.9] Severe sepsis ([place]) [A41.9, R65.20]  Patient is known to our clinic from previous admissions. She is followed by Dr Arty Baumgartner in [place] at [facility]. She states she recently was seen in office and advised to come to hospital for abnormal sodium labs. Patient states she was at [facility] receiving rehab, able to ambulate with assistance. She denies weakness, loss of appetite, or nausea and vomiting. Reports pain from sacral wound. Denies shortness of breath. States nephrology was preparing for vein mapping to prepare for dialysis in the future. She continues to make urine.    Update:  Renal function improved today slightly as creatinine down to 3.8. Reasonable urine output of 1 L over the preceding 24 hours. Still has considerable volume overload.    Objective:  Vital signs in last 24 hours:  Temp:  [98.2 F (36.8 C)-98.8 F (37.1 C)] 98.3 F (36.8 C) (06/03 1617) Pulse Rate:  [79-109] 90 (06/03 1617) Resp:  [16-18] 18 (06/03 1617) BP: (124-165)/(53-85) 151/63 (06/03 1617) SpO2:  [97 %-100 %] 99 % (06/03 1617)  Weight change:  Filed Weights   12/31/21 1824  Weight: 97 kg    Intake/Output: I/O last 3 completed shifts: In: 695.4 [P.O.:240; IV Piggyback:455.4] Out: 1350 [Urine:1350]   Intake/Output this shift:  Total I/O In: 120 [P.O.:120] Out: 1000 [Urine:1000]  Physical Exam: General: NAD  Head: Normocephalic, atraumatic. Moist oral mucosal membranes  Eyes: Anicteric  Lungs:  Clear to auscultation, normal effort, room air  Heart: Regular rhythm, tachycardia   Abdomen:  Soft, nontender, obese  Extremities: 3+ peripheral edema.  Neurologic: Nonfocal, moving all four extremities  Skin: No acute rash  Access: None    Basic Metabolic Panel: Recent Labs  Lab 12/31/21 1826 01/01/22 0202 01/01/22 0752 01/01/22 1333 01/01/22 1446 01/02/22 0703 01/03/22 0613  NA 129* 132*  --  133* 133* 136 135  K 5.1 5.1  --  4.5 4.6 4.7 4.6  CL 99 105  --  103 103 108 105  CO2 17* 17*  --  17*  --  17* 19*  GLUCOSE 181* 243*  --  168* 157* 100* 92  BUN 100* 98*  --  95* 88* 89* 94*  CREATININE 4.61* 4.77*  --  4.48* 4.80* 4.12* 3.88*  CALCIUM 8.3* 8.0*  --  8.1*  --  8.6* 8.6*  MG  --   --  1.0* 2.0  --  1.7 1.6*     Liver Function Tests: Recent Labs  Lab 12/31/21 1826 01/01/22 0202 01/02/22 0703 01/03/22 0613  AST 192* 182* 65* 184*  ALT 116* 107* 67* 93*  ALKPHOS 229* 212* 175* 304*  BILITOT 0.5 0.7 0.9 0.6  PROT 7.0 6.3* 6.5 5.8*  ALBUMIN 2.3* 2.1* 2.0* 1.9*    No results for input(s): LIPASE, AMYLASE in the last 168 hours. No results for input(s): AMMONIA in the last 168 hours.  CBC: Recent Labs  Lab 12/31/21 1826 01/01/22 0202 01/01/22 0752 01/01/22 1446 01/02/22 0703 01/03/22 0613  WBC 18.4* 17.6* 13.8*  --  16.4* 13.0*  NEUTROABS  --   --  11.1*  --   --   --  HGB 6.9* 6.3* 6.6* 8.5* 8.5* 8.0*  HCT 22.1* 19.9* 20.5* 25.0* 25.9* 24.6*  MCV 98.2 97.5 94.5  --  92.2 92.5  PLT 473* 410* 335  --  405* 360     Cardiac Enzymes: No results for input(s): CKTOTAL, CKMB, CKMBINDEX, TROPONINI in the last 168 hours.  BNP: Invalid input(s): POCBNP  CBG: Recent Labs  Lab 01/02/22 1149 01/02/22 1622 01/02/22 2043 01/03/22 0752 01/03/22 1212  GLUCAP 118* 104* 80 90 104*     Microbiology: Results for orders placed or performed during the hospital encounter of 12/31/21  Blood culture (routine x 2)     Status: None (Preliminary result)   Collection Time: 12/31/21 11:35 PM   Specimen: BLOOD  Result Value Ref Range  Status   Specimen Description BLOOD RIGHT ARM  Final   Special Requests   Final    BOTTLES DRAWN AEROBIC AND ANAEROBIC Blood Culture adequate volume   Culture   Final    NO GROWTH 2 DAYS Performed at [facility], [address] [place], [place] [address]    Report Status PENDING  Incomplete  Blood culture (routine x 2)     Status: None (Preliminary result)   Collection Time: 12/31/21 11:49 PM   Specimen: BLOOD  Result Value Ref Range Status   Specimen Description BLOOD RIGHT HAND  Final   Special Requests   Final    BOTTLES DRAWN AEROBIC AND ANAEROBIC Blood Culture adequate volume   Culture   Final    NO GROWTH 2 DAYS Performed at [facility], [address], [place], [place] [address]    Report Status PENDING  Incomplete  MRSA Next Gen by PCR, Nasal     Status: None   Collection Time: 01/01/22 10:17 AM   Specimen: Nasal Mucosa; Nasal Swab  Result Value Ref Range Status   MRSA by PCR Next Gen NOT DETECTED NOT DETECTED Final    Comment: (NOTE) The GeneXpert MRSA Assay (FDA approved for NASAL specimens only), is one component of a comprehensive MRSA colonization surveillance program. It is not intended to diagnose MRSA infection nor to guide or monitor treatment for MRSA infections. Test performance is not FDA approved in patients less than 55 years old. Performed at [facility], [place], [place] [address]   Aerobic/Anaerobic Culture w Gram Stain (surgical/deep wound)     Status: None (Preliminary result)   Collection Time: 01/01/22  3:37 PM   Specimen: PATH Soft tissue  Result Value Ref Range Status   Specimen Description TISSUE  Final   Special Requests BUTTOCKS WOUND  Final   Gram Stain   Final    NO SQUAMOUS EPITHELIAL CELLS SEEN FEW WBC SEEN RARE GRAM POSITIVE RODS FEW GRAM POSITIVE COCCI Performed at [facility], [place] [address] [place], [place] [address]    Culture   Final    FEW STAPHYLOCOCCUS  AUREUS SUSCEPTIBILITIES TO FOLLOW CULTURE REINCUBATED FOR BETTER GROWTH NO ANAEROBES ISOLATED; CULTURE IN PROGRESS FOR 5 DAYS    Report Status PENDING  Incomplete  Aerobic/Anaerobic Culture w Gram Stain (surgical/deep wound)     Status: None (Preliminary result)   Collection Time: 01/01/22  3:46 PM   Specimen: PATH Soft tissue  Result Value Ref Range Status   Specimen Description TISSUE  Final   Special Requests BUTTOCKS WOUND NO 2  Final   Gram Stain   Final    NO SQUAMOUS EPITHELIAL CELLS SEEN FEW WBC SEEN FEW GRAM POSITIVE  COCCI Performed at [facility], [place] [address], [place], [place] [address]    Culture   Final    MODERATE STAPHYLOCOCCUS AUREUS SUSCEPTIBILITIES TO FOLLOW CULTURE REINCUBATED FOR BETTER GROWTH NO ANAEROBES ISOLATED; CULTURE IN PROGRESS FOR 5 DAYS    Report Status PENDING  Incomplete    Coagulation Studies: Recent Labs    12/31/21 2353  LABPROT 16.5*  INR 1.4*     Urinalysis: Recent Labs    01/02/22 0038  COLORURINE YELLOW*  LABSPEC 1.013  PHURINE 5.0  GLUCOSEU NEGATIVE  HGBUR SMALL*  BILIRUBINUR NEGATIVE  KETONESUR NEGATIVE  PROTEINUR 100*  NITRITE NEGATIVE  LEUKOCYTESUR NEGATIVE       Imaging: ECHOCARDIOGRAM COMPLETE  Result Date: 01/03/2022    ECHOCARDIOGRAM REPORT   Patient Name:   [facility] Date of Exam: 01/03/2022 Medical Rec #:  [id number]              Height:       63.0 in Accession #:    [id number]             Weight:       213.8 lb Date of Birth:  07-Apr-1956              BSA:          1.989 m Patient Age:    64 years               BP:           129/62 mmHg Patient Gender: F                      HR:           88 bpm. Exam Location:  [facility] Procedure: 2D Echo Indications:     Atrial Fibrillation I48.91  History:         Patient has no prior history of Echocardiogram examinations.  Sonographer:     Kathlen Brunswick RDCS Referring Phys:  [id number] SHERI HAMMOCK Diagnosing Phys: Eleonore Chiquito MD IMPRESSIONS  1. Left  ventricular ejection fraction, by estimation, is 60 to 65%. Left ventricular ejection fraction by 2D MOD biplane is 63.2 %. The left ventricle has normal function. The left ventricle has no regional wall motion abnormalities. There is mild asymmetric left ventricular hypertrophy of the basal-septal segment. Left ventricular diastolic parameters were normal.  2. Right ventricular systolic function is normal. The right ventricular size is normal. Tricuspid regurgitation signal is inadequate for assessing PA pressure.  3. The mitral valve is grossly normal. Trivial mitral valve regurgitation. No evidence of mitral stenosis.  4. The aortic valve is tricuspid. There is mild calcification of the aortic valve. Aortic valve regurgitation is trivial. Aortic valve sclerosis is present, with no evidence of aortic valve stenosis.  5. The inferior vena cava is normal in size with greater than 50% respiratory variability, suggesting right atrial pressure of 3 mmHg. FINDINGS  Left Ventricle: Left ventricular ejection fraction, by estimation, is 60 to 65%. Left ventricular ejection fraction by 2D MOD biplane is 63.2 %. The left ventricle has normal function. The left ventricle has no regional wall motion abnormalities. The left ventricular internal cavity size was normal in size. There is mild asymmetric left ventricular hypertrophy of the basal-septal segment. Left ventricular diastolic parameters were normal. Right Ventricle: The right ventricular size is normal. No increase in right ventricular wall thickness. Right ventricular systolic function is normal. Tricuspid regurgitation signal is inadequate for assessing PA pressure.  Left Atrium: Left atrial size was normal in size. Right Atrium: Right atrial size was normal in size. Pericardium: There is no evidence of pericardial effusion. Mitral Valve: The mitral valve is grossly normal. Trivial mitral valve regurgitation. No evidence of mitral valve stenosis. Tricuspid Valve: The  tricuspid valve is grossly normal. Tricuspid valve regurgitation is not demonstrated. No evidence of tricuspid stenosis. Aortic Valve: The aortic valve is tricuspid. There is mild calcification of the aortic valve. Aortic valve regurgitation is trivial. Aortic valve sclerosis is present, with no evidence of aortic valve stenosis. Aortic valve peak gradient measures 7.8 mmHg. Pulmonic Valve: The pulmonic valve was grossly normal. Pulmonic valve regurgitation is not visualized. No evidence of pulmonic stenosis. Aorta: The aortic root is normal in size and structure. Venous: The inferior vena cava is normal in size with greater than 50% respiratory variability, suggesting right atrial pressure of 3 mmHg. IAS/Shunts: The atrial septum is grossly normal.  LEFT VENTRICLE PLAX 2D                        Biplane EF (MOD) LVIDd:         3.45 cm         LV Biplane EF:   Left LVIDs:         2.37 cm                          ventricular LV PW:         1.43 cm                          ejection LV IVS:        1.24 cm                          fraction by                                                 2D MOD                                                 biplane is LV Volumes (MOD)                                63.2 %. LV vol d, MOD    65.4 ml A2C:                           Diastology LV vol d, MOD    78.7 ml       LV e' medial:    7.72 cm/s A4C:                           LV E/e' medial:  13.6 LV vol s, MOD    24.5 ml       LV e' lateral:   8.16 cm/s A2C:  LV E/e' lateral: 12.9 LV vol s, MOD    27.2 ml A4C: LV SV MOD A2C:   40.9 ml LV SV MOD A4C:   78.7 ml LV SV MOD BP:    45.4 ml RIGHT VENTRICLE RV Basal diam:  3.07 cm RV S prime:     16.00 cm/s TAPSE (M-mode): 2.2 cm LEFT ATRIUM             Index        RIGHT ATRIUM           Index LA diam:        4.20 cm 2.11 cm/m   RA Area:     10.30 cm LA Vol (A2C):   24.3 ml 12.21 ml/m  RA Volume:   20.60 ml  10.35 ml/m LA Vol (A4C):   33.9 ml 17.04 ml/m LA  Biplane Vol: 30.7 ml 15.43 ml/m  AORTIC VALVE              PULMONIC VALVE AV Vmax:      140.00 cm/s PV Vmax:       1.05 m/s AV Peak Grad: 7.8 mmHg    PV Peak grad:  4.4 mmHg LVOT Vmax:    102.00 cm/s LVOT Vmean:   67.500 cm/s LVOT VTI:     0.241 m MITRAL VALVE MV Area (PHT): 4.06 cm     SHUNTS MV Decel Time: 187 msec     Systemic VTI: 0.24 m MV E velocity: 105.00 cm/s MV A velocity: 84.40 cm/s MV E/A ratio:  1.24 Eleonore Chiquito MD Electronically signed by Eleonore Chiquito MD Signature Date/Time: 01/03/2022/4:18:04 PM    Final    US Abdomen Limited RUQ (LIVER/GB)  Result Date: 01/01/2022 CLINICAL DATA:  Transaminitis EXAM: ULTRASOUND ABDOMEN LIMITED RIGHT UPPER QUADRANT COMPARISON:  CT today FINDINGS: Gallbladder: No gallstones or wall thickening visualized. No sonographic Murphy sign noted by sonographer. Common bile duct: Diameter: Normal caliber, 4 mm Liver: No focal lesion identified. Within normal limits in parenchymal echogenicity. Portal vein is patent on color Doppler imaging with normal direction of blood flow towards the liver. Other: None. IMPRESSION: Unremarkable right upper quadrant ultrasound. Electronically Signed   By: Rolm Baptise M.D.   On: 01/01/2022 19:09     Medications:    albumin human 25 g (01/03/22 1058)   ceFEPime (MAXIPIME) IV 2 g (01/02/22 2243)   vancomycin 500 mg (01/02/22 2348)    vitamin C  500 mg Oral BID   atorvastatin  40 mg Oral Daily   fluticasone  2 spray Each Nare Daily   furosemide  60 mg Intravenous BID   gabapentin  100 mg Oral TID   insulin aspart  0-5 Units Subcutaneous QHS   insulin aspart  0-9 Units Subcutaneous TID WC   insulin glargine-yfgn  20 Units Subcutaneous BID   loratadine  10 mg Oral Daily   metoprolol succinate  25 mg Oral BID   multivitamin with minerals  1 tablet Oral Daily   nutrition supplement (JUVEN)  1 packet Oral BID BM   Ensure Max Protein  11 oz Oral QHS   sodium bicarbonate  1,300 mg Oral BID   zinc sulfate  220 mg Oral Daily    acetaminophen **OR** acetaminophen, hydrALAZINE, hydrALAZINE, morphine injection, ondansetron **OR** ondansetron (ZOFRAN) IV, oxyCODONE  Assessment/ Plan:  Ms. Meliana Canner is a 66 y.o.  female with past medical history of hypertension, hyperlipidemia, diabetes, and chronic kidney disease stage V. Patient presents to ed for abnormal  labs. She has been admitted for Hyponatremia [E87.1] AKI (acute kidney injury) ([place]) [N17.9] Symptomatic anemia [D64.9] Anemia, unspecified type [D64.9] Severe sepsis ([place]) [A41.9, R65.20]   Acute Kidney Injury with hyponatremia on chronic kidney disease stage V with baseline creatinine 3.91 and GFR of 12 on 12/23/21.  Acute kidney injury secondary to underlying illness Chronic kidney disease is secondary to diabetes and hypertension No exposure to IV contrast.  CT abdomen pelvis negative for obstruction, several renal stones identified.  Losartan and furosemide  held since admission.    Renal function slightly improved today.  Okay to maintain the patient on diuretics for now.  Lab Results  Component Value Date   CREATININE 3.88 (H) 01/03/2022   CREATININE 4.12 (H) 01/02/2022   CREATININE 4.80 (H) 01/01/2022    Intake/Output Summary (Last 24 hours) at 01/03/2022 1651 Last data filed at 01/03/2022 1300 Gross per 24 hour  Intake 615.43 ml  Output 2050 ml  Net -1434.57 ml    2. Anemia of chronic kidney disease Lab Results  Component Value Date   HGB 8.0 (L) 01/03/2022  Hemoglobin up to 8.0 posttransfusion.  Continue to monitor hemoglobin.  3. Secondary Hyperparathyroidism:  Lab Results  Component Value Date   CALCIUM 8.6 (L) 01/03/2022   CAION 1.11 (L) 01/01/2022  Calcium within acceptable range.  We will continue to monitor.  4.  Acute metabolic acidosis.  Serum bicarbonate improved to 19.  Continue sodium bicarbonate.  5.  Generalized edema.  Continue Lasix 60 mg IV twice daily.  When the mom was for you to use for you inside when  in the bathroom 2 months.    LOS: 2 Johnjoseph Rolfe 6/3/20234:51 PM

## 2022-01-03 NOTE — Evaluation (Signed)
Occupational Therapy Evaluation Patient Details Name: Theresa Warren MRN: [id number] DOB: 1955-09-11 Today's Date: 01/03/2022   History of Present Illness [name] is a 66 year old female admitted for evaluation of low sodium noted on labs at the facility.  Pt was discharged  to rehab on 5/23 after being admitted following a fall. PMH significant for acute on chronic anemia. hyperlipidemia, hypertension, CKD stage V, insulin-dependent diabetes mellitus   Clinical Impression   Chart reviewed, RN cleared pt for participation in OT evaluation. Pt was recently at rehab, reports she was amb with RW, getting ready to discharge home. Pt appears with impaired awareness of current level of function and deficits. Pt lives home alone, 4 STE. At this time pt requires MOD A +2 for supine>sit, STS with CGA +2, short amb transfer to bedside chair with RW with CGA +2. MAX A required for LB dressing, MOD A required for UB dressing. Pt is limited by pain, activity tolerance, endurance at this time. Recommend discharge to STR to address deficits at this time. OT will continue to follow acutely.      Recommendations for follow up therapy are one component of a multi-disciplinary discharge planning process, led by the attending physician.  Recommendations may be updated based on patient status, additional functional criteria and insurance authorization.   Follow Up Recommendations  Skilled nursing-short term rehab (<3 hours/day)    Assistance Recommended at Discharge Frequent or constant Supervision/Assistance  Patient can return home with the following A lot of help with walking and/or transfers;A lot of help with bathing/dressing/bathroom    Functional Status Assessment  Patient has had a recent decline in their functional status and demonstrates the ability to make significant improvements in function in a reasonable and predictable amount of time.  Equipment Recommendations  BSC/3in1     Recommendations for Other Services       Precautions / Restrictions Precautions Precautions: Fall Restrictions Weight Bearing Restrictions: No      Mobility Bed Mobility Overal bed mobility: Needs Assistance Bed Mobility: Sit to Supine     Supine to sit: Mod assist, HOB elevated     General bed mobility comments: step by step vcs    Transfers Overall transfer level: Needs assistance Equipment used: Rolling walker (2 wheels) Transfers: Sit to/from Stand Sit to Stand: Min guard                  Balance Overall balance assessment: Needs assistance Sitting-balance support: No upper extremity supported, Feet supported Sitting balance-Leahy Scale: Good     Standing balance support: Bilateral upper extremity supported Standing balance-Leahy Scale: Fair                             ADL either performed or assessed with clinical judgement   ADL Overall ADL's : Needs assistance/impaired Eating/Feeding: Sitting;Set up               Upper Body Dressing : Moderate assistance;Sitting   Lower Body Dressing: Maximal assistance Lower Body Dressing Details (indicate cue type and reason): socks Toilet Transfer: +2 for safety/equipment;Rolling walker (2 wheels);Min guard Toilet Transfer Details (indicate cue type and reason): CGA for short amb transfer to bedside chair Toileting- Clothing Manipulation and Hygiene: Maximal assistance;Sit to/from stand               Vision Patient Visual Report: No change from baseline       Perception     Praxis  Pertinent Vitals/Pain Pain Assessment Pain Assessment: Faces Faces Pain Scale: Hurts even more Pain Location: buttocks Pain Descriptors / Indicators: Discomfort, Grimacing Pain Intervention(s): Limited activity within patient's tolerance, Monitored during session, Premedicated before session, Repositioned     Hand Dominance     Extremity/Trunk Assessment Upper Extremity Assessment Upper  Extremity Assessment: Generalized weakness   Lower Extremity Assessment Lower Extremity Assessment: Generalized weakness       Communication Communication Communication: No difficulties   Cognition Arousal/Alertness: Awake/alert Behavior During Therapy: WFL for tasks assessed/performed Overall Cognitive Status: Within Functional Limits for tasks assessed Area of Impairment: Safety/judgement, Problem solving                         Safety/Judgement: Decreased awareness of deficits, Decreased awareness of safety   Problem Solving: Requires verbal cues General Comments: alert and oriented x4, decreased awareness of current deficits     General Comments  +wound vac on buttocks    Exercises     Shoulder Instructions      Home Living Family/patient expects to be discharged to:: Private residence Living Arrangements: Alone   Type of Home: House Home Access: Stairs to enter [employer] of Steps: 4-5 Entrance Stairs-Rails: Right Home Layout: Two level;Able to live on main level with bedroom/bathroom               Home Equipment: Cane - [place] point          Prior Functioning/Environment Prior Level of Function : Independent/Modified Independent;Driving             Mobility Comments: Pt reports she was amb with RW around STR with MOD I; prior to admission prior to fall waas amb with cane, +fall history ADLs Comments: assist for ADLs in rehab, was trialing use of AE; prior to previous admission was indep in ADL/IADL per pt report        OT Problem List: Decreased strength;Decreased range of motion;Decreased activity tolerance;Impaired balance (sitting and/or standing);Decreased safety awareness      OT Treatment/Interventions: Self-care/ADL training;Therapeutic exercise;Energy conservation;DME and/or AE instruction;Therapeutic activities;Patient/family education;Balance training    OT Goals(Current goals can be found in the care plan  section) Acute Rehab OT Goals Patient Stated Goal: return home OT Goal Formulation: With patient Time For Goal Achievement: 01/17/22 Potential to Achieve Goals: Fair ADL Goals Pt Will Perform Grooming: with modified independence;standing Pt Will Perform Upper Body Dressing: with modified independence Pt Will Perform Lower Body Dressing: with modified independence;sit to/from stand Pt Will Transfer to Toilet: with modified independence;ambulating Pt Will Perform Toileting - Clothing Manipulation and hygiene: with modified independence;sit to/from stand  OT Frequency: Min 2X/week    Co-evaluation PT/OT/SLP Co-Evaluation/Treatment: Yes Reason for Co-Treatment: For patient/therapist safety;To address functional/ADL transfers   OT goals addressed during session: ADL's and self-care      AM-PAC OT "6 Clicks" Daily Activity     Outcome Measure Help from another person eating meals?: None Help from another person taking care of personal grooming?: A Little Help from another person toileting, which includes using toliet, bedpan, or urinal?: A Lot Help from another person bathing (including washing, rinsing, drying)?: A Lot Help from another person to put on and taking off regular upper body clothing?: A Little Help from another person to put on and taking off regular lower body clothing?: A Lot 6 Click Score: 16   End of Session Equipment Utilized During Treatment: Rolling walker (2 wheels);Other (comment) (wound vac) Nurse Communication: Mobility  status  Activity Tolerance: Patient tolerated treatment well Patient left: in chair;with call bell/phone within reach  OT Visit Diagnosis: Other abnormalities of gait and mobility (R26.89);Muscle weakness (generalized) (M62.81)                Time: 8372-9021 OT Time Calculation (min): 61 min Charges:  OT General Charges $OT Visit: 1 Visit OT Evaluation $OT Eval Moderate Complexity: 1 Mod OT Treatments $Self Care/Home Management : 8-22  mins $Therapeutic Activity: 8-22 mins  Shanon Payor, OTD OTR/L  01/03/22, 1:06 PM

## 2022-01-04 DIAGNOSIS — R6520 Severe sepsis without septic shock: Secondary | ICD-10-CM

## 2022-01-04 DIAGNOSIS — A419 Sepsis, unspecified organism: Secondary | ICD-10-CM | POA: Diagnosis not present

## 2022-01-04 DIAGNOSIS — L89313 Pressure ulcer of right buttock, stage 3: Secondary | ICD-10-CM | POA: Diagnosis not present

## 2022-01-04 DIAGNOSIS — L89323 Pressure ulcer of left buttock, stage 3: Secondary | ICD-10-CM | POA: Diagnosis not present

## 2022-01-04 LAB — BASIC METABOLIC PANEL
Anion gap: 10 (ref 5–15)
BUN: 99 mg/dL — ABNORMAL HIGH (ref 8–23)
CO2: 18 mmol/L — ABNORMAL LOW (ref 22–32)
Calcium: 9.1 mg/dL (ref 8.9–10.3)
Chloride: 105 mmol/L (ref 98–111)
Creatinine, Ser: 3.98 mg/dL — ABNORMAL HIGH (ref 0.44–1.00)
GFR, Estimated: 12 mL/min — ABNORMAL LOW (ref >60)
Glucose, Bld: 244 mg/dL — ABNORMAL HIGH (ref 70–99)
Potassium: 4.4 mmol/L (ref 3.5–5.1)
Sodium: 133 mmol/L — ABNORMAL LOW (ref 135–145)

## 2022-01-04 LAB — CBC
HCT: 23.9 % — ABNORMAL LOW (ref 36.0–46.0)
Hemoglobin: 7.9 g/dL — ABNORMAL LOW (ref 12.0–15.0)
MCH: 30.2 pg (ref 26.0–34.0)
MCHC: 33.1 g/dL (ref 30.0–36.0)
MCV: 91.2 fL (ref 80.0–100.0)
Platelets: 347 10*3/uL (ref 150–400)
RBC: 2.62 MIL/uL — ABNORMAL LOW (ref 3.87–5.11)
RDW: 15.2 % (ref 11.5–15.5)
WBC: 11.5 10*3/uL — ABNORMAL HIGH (ref 4.0–10.5)
nRBC: 0 % (ref 0.0–0.2)

## 2022-01-04 LAB — MAGNESIUM: Magnesium: 2.1 mg/dL (ref 1.7–2.4)

## 2022-01-04 LAB — GLUCOSE, CAPILLARY
Glucose-Capillary: 231 mg/dL — ABNORMAL HIGH (ref 70–99)
Glucose-Capillary: 192 mg/dL — ABNORMAL HIGH (ref 70–99)
Glucose-Capillary: 219 mg/dL — ABNORMAL HIGH (ref 70–99)
Glucose-Capillary: 187 mg/dL — ABNORMAL HIGH (ref 70–99)

## 2022-01-04 LAB — TSH: TSH: 1.975 u[IU]/mL (ref 0.350–4.500)

## 2022-01-04 MED ORDER — FUROSEMIDE 10 MG/ML IJ SOLN
6.0000 mg/h | INTRAVENOUS | Status: DC
  Administered 2022-01-04 – 2022-01-06 (×2): 6 mg/h via INTRAVENOUS
  Filled 2022-01-04 (×2): qty 20

## 2022-01-04 MED ORDER — METOPROLOL SUCCINATE ER 50 MG PO TB24
50.0000 mg | ORAL_TABLET | Freq: Two times a day (BID) | ORAL | Status: DC
  Administered 2022-01-04 – 2022-01-23 (×36): 50 mg via ORAL
  Filled 2022-01-04 (×39): qty 1

## 2022-01-04 NOTE — Progress Notes (Signed)
Cardiology Progress Note  Patient ID: Theresa Warren MRN: [id number] DOB: October 03, 1955 Date of Encounter: 01/04/2022  Primary Cardiologist: Kathlyn [place], MD  Subjective   Chief Complaint: SOB  HPI: Good diuresis overnight. Tele with SR and PACs.   ROS:  All other ROS reviewed and negative. Pertinent positives noted in the HPI.     Inpatient Medications  Scheduled Meds:  vitamin C  500 mg Oral BID   atorvastatin  40 mg Oral Daily   fluticasone  2 spray Each Nare Daily   furosemide  60 mg Intravenous BID   gabapentin  100 mg Oral TID   insulin aspart  0-5 Units Subcutaneous QHS   insulin aspart  0-9 Units Subcutaneous TID WC   insulin glargine-yfgn  20 Units Subcutaneous BID   loratadine  10 mg Oral Daily   metoprolol succinate  25 mg Oral BID   multivitamin with minerals  1 tablet Oral Daily   nutrition supplement (JUVEN)  1 packet Oral BID BM   Ensure Max Protein  11 oz Oral QHS   sodium bicarbonate  1,300 mg Oral BID   zinc sulfate  220 mg Oral Daily   Continuous Infusions:  albumin human 25 g (01/04/22 0827)   vancomycin 500 mg (01/02/22 2348)   PRN Meds: hydrALAZINE, hydrALAZINE, morphine injection, ondansetron **OR** ondansetron (ZOFRAN) IV, oxyCODONE   Vital Signs   Vitals:   01/03/22 2047 01/04/22 0023 01/04/22 0542 01/04/22 0811  BP: 132/65 139/69 (!) 143/60 130/78  Pulse: 95 71 93 88  Resp: 17 16 20 18   Temp: 98.6 F (37 C)  98.6 F (37 C) 98.8 F (37.1 C)  TempSrc: Oral  Oral   SpO2: 99% 98% 98% 97%  Weight:      Height:        Intake/Output Summary (Last 24 hours) at 01/04/2022 0921 Last data filed at 01/04/2022 0545 Gross per 24 hour  Intake 514.82 ml  Output 3200 ml  Net -2685.18 ml      12/31/2021    6:24 PM 12/22/2021    5:00 AM 12/21/2021    4:53 AM  Last 3 Weights  Weight (lbs) 213 lb 13.5 oz 213 lb 13.5 oz 208 lb 15.9 oz  Weight (kg) 97 kg 97 kg 94.8 kg      Telemetry  Overnight telemetry shows SR 90s with PACs, which I  personally reviewed.   Physical Exam   Vitals:   01/03/22 2047 01/04/22 0023 01/04/22 0542 01/04/22 0811  BP: 132/65 139/69 (!) 143/60 130/78  Pulse: 95 71 93 88  Resp: 17 16 20 18   Temp: 98.6 F (37 C)  98.6 F (37 C) 98.8 F (37.1 C)  TempSrc: Oral  Oral   SpO2: 99% 98% 98% 97%  Weight:      Height:        Intake/Output Summary (Last 24 hours) at 01/04/2022 0921 Last data filed at 01/04/2022 0545 Gross per 24 hour  Intake 514.82 ml  Output 3200 ml  Net -2685.18 ml       12/31/2021    6:24 PM 12/22/2021    5:00 AM 12/21/2021    4:53 AM  Last 3 Weights  Weight (lbs) 213 lb 13.5 oz 213 lb 13.5 oz 208 lb 15.9 oz  Weight (kg) 97 kg 97 kg 94.8 kg    Body mass index is 37.88 kg/m.  General: Well nourished, well developed, in no acute distress Head: Atraumatic, normal size  Eyes: PEERLA, EOMI  Neck: Supple,  no JVD Endocrine: No thryomegaly Cardiac: Normal S1, S2; irregular rhythm  Lungs: Clear to auscultation bilaterally, no wheezing, rhonchi or rales  Abd: Soft, nontender, no hepatomegaly  Ext: 2+ pitting edema  Musculoskeletal: No deformities, BUE and BLE strength normal and equal Skin: Warm and dry, no rashes   Neuro: Alert and oriented to person, place, time, and situation, CNII-XII grossly intact, no focal deficits  Psych: Normal mood and affect   Labs  High Sensitivity Troponin:   Recent Labs  Lab 12/18/21 1213 12/18/21 1837  TROPONINIHS 20* 21*     Cardiac EnzymesNo results for input(s): TROPONINI in the last 168 hours. No results for input(s): TROPIPOC in the last 168 hours.  Chemistry Recent Labs  Lab 01/01/22 0202 01/01/22 1333 01/02/22 0703 01/03/22 0613 01/04/22 0618  NA 132*   < > 136 135 133*  K 5.1   < > 4.7 4.6 4.4  CL 105   < > 108 105 105  CO2 17*   < > 17* 19* 18*  GLUCOSE 243*   < > 100* 92 244*  BUN 98*   < > 89* 94* 99*  CREATININE 4.77*   < > 4.12* 3.88* 3.98*  CALCIUM 8.0*   < > 8.6* 8.6* 9.1  PROT 6.3*  --  6.5 5.8*  --   ALBUMIN  2.1*  --  2.0* 1.9*  --   AST 182*  --  65* 184*  --   ALT 107*  --  67* 93*  --   ALKPHOS 212*  --  175* 304*  --   BILITOT 0.7  --  0.9 0.6  --   GFRNONAA 10*   < > 12* 12* 12*  ANIONGAP 10   < > 11 11 10    < > = values in this interval not displayed.    Hematology Recent Labs  Lab 01/02/22 0703 01/03/22 0938 01/04/22 0618  WBC 16.4* 13.0* 11.5*  RBC 2.81* 2.66* 2.62*  HGB 8.5* 8.0* 7.9*  HCT 25.9* 24.6* 23.9*  MCV 92.2 92.5 91.2  MCH 30.2 30.1 30.2  MCHC 32.8 32.5 33.1  RDW 15.9* 15.8* 15.2  PLT 405* 360 347   BNPNo results for input(s): BNP, PROBNP in the last 168 hours.  DDimer No results for input(s): DDIMER in the last 168 hours.   Radiology  ECHOCARDIOGRAM COMPLETE  Result Date: 01/03/2022    ECHOCARDIOGRAM REPORT   Patient Name:   [facility] [place] Date of Exam: 01/03/2022 Medical Rec #:  [id number]              Height:       63.0 in Accession #:    [id number]             Weight:       213.8 lb Date of Birth:  05-Sep-1955              BSA:          1.989 m Patient Age:    6 years               BP:           129/62 mmHg Patient Gender: F                      HR:           88 bpm. Exam Location:  [facility] Procedure: 2D Echo Indications:     Atrial Fibrillation I48.91  History:  Patient has no prior history of Echocardiogram examinations.  Sonographer:     Kathlen Brunswick RDCS Referring Phys:  [id number] SHERI HAMMOCK Diagnosing Phys: Eleonore Chiquito MD IMPRESSIONS  1. Left ventricular ejection fraction, by estimation, is 60 to 65%. Left ventricular ejection fraction by 2D MOD biplane is 63.2 %. The left ventricle has normal function. The left ventricle has no regional wall motion abnormalities. There is mild asymmetric left ventricular hypertrophy of the basal-septal segment. Left ventricular diastolic parameters were normal.  2. Right ventricular systolic function is normal. The right ventricular size is normal. Tricuspid regurgitation signal is inadequate for assessing PA  pressure.  3. The mitral valve is grossly normal. Trivial mitral valve regurgitation. No evidence of mitral stenosis.  4. The aortic valve is tricuspid. There is mild calcification of the aortic valve. Aortic valve regurgitation is trivial. Aortic valve sclerosis is present, with no evidence of aortic valve stenosis.  5. The inferior vena cava is normal in size with greater than 50% respiratory variability, suggesting right atrial pressure of 3 mmHg. FINDINGS  Left Ventricle: Left ventricular ejection fraction, by estimation, is 60 to 65%. Left ventricular ejection fraction by 2D MOD biplane is 63.2 %. The left ventricle has normal function. The left ventricle has no regional wall motion abnormalities. The left ventricular internal cavity size was normal in size. There is mild asymmetric left ventricular hypertrophy of the basal-septal segment. Left ventricular diastolic parameters were normal. Right Ventricle: The right ventricular size is normal. No increase in right ventricular wall thickness. Right ventricular systolic function is normal. Tricuspid regurgitation signal is inadequate for assessing PA pressure. Left Atrium: Left atrial size was normal in size. Right Atrium: Right atrial size was normal in size. Pericardium: There is no evidence of pericardial effusion. Mitral Valve: The mitral valve is grossly normal. Trivial mitral valve regurgitation. No evidence of mitral valve stenosis. Tricuspid Valve: The tricuspid valve is grossly normal. Tricuspid valve regurgitation is not demonstrated. No evidence of tricuspid stenosis. Aortic Valve: The aortic valve is tricuspid. There is mild calcification of the aortic valve. Aortic valve regurgitation is trivial. Aortic valve sclerosis is present, with no evidence of aortic valve stenosis. Aortic valve peak gradient measures 7.8 mmHg. Pulmonic Valve: The pulmonic valve was grossly normal. Pulmonic valve regurgitation is not visualized. No evidence of pulmonic  stenosis. Aorta: The aortic root is normal in size and structure. Venous: The inferior vena cava is normal in size with greater than 50% respiratory variability, suggesting right atrial pressure of 3 mmHg. IAS/Shunts: The atrial septum is grossly normal.  LEFT VENTRICLE PLAX 2D                        Biplane EF (MOD) LVIDd:         3.45 cm         LV Biplane EF:   Left LVIDs:         2.37 cm                          ventricular LV PW:         1.43 cm                          ejection LV IVS:        1.24 cm  fraction by                                                 2D MOD                                                 biplane is LV Volumes (MOD)                                63.2 %. LV vol d, MOD    65.4 ml A2C:                           Diastology LV vol d, MOD    78.7 ml       LV e' medial:    7.72 cm/s A4C:                           LV E/e' medial:  13.6 LV vol s, MOD    24.5 ml       LV e' lateral:   8.16 cm/s A2C:                           LV E/e' lateral: 12.9 LV vol s, MOD    27.2 ml A4C: LV SV MOD A2C:   40.9 ml LV SV MOD A4C:   78.7 ml LV SV MOD BP:    45.4 ml RIGHT VENTRICLE RV Basal diam:  3.07 cm RV S prime:     16.00 cm/s TAPSE (M-mode): 2.2 cm LEFT ATRIUM             Index        RIGHT ATRIUM           Index LA diam:        4.20 cm 2.11 cm/m   RA Area:     10.30 cm LA Vol (A2C):   24.3 ml 12.21 ml/m  RA Volume:   20.60 ml  10.35 ml/m LA Vol (A4C):   33.9 ml 17.04 ml/m LA Biplane Vol: 30.7 ml 15.43 ml/m  AORTIC VALVE              PULMONIC VALVE AV Vmax:      140.00 cm/s PV Vmax:       1.05 m/s AV Peak Grad: 7.8 mmHg    PV Peak grad:  4.4 mmHg LVOT Vmax:    102.00 cm/s LVOT Vmean:   67.500 cm/s LVOT VTI:     0.241 m MITRAL VALVE MV Area (PHT): 4.06 cm     SHUNTS MV Decel Time: 187 msec     Systemic VTI: 0.24 m MV E velocity: 105.00 cm/s MV A velocity: 84.40 cm/s MV E/A ratio:  1.24 Eleonore Chiquito MD Electronically signed by Eleonore Chiquito MD Signature Date/Time:  01/03/2022/4:18:04 PM    Final     Cardiac Studies  TTE 01/03/2022   1. Left ventricular ejection fraction, by estimation, is 60 to 65%. Left  ventricular ejection fraction by 2D MOD biplane is 63.2 %. The left  ventricle  has normal function. The left ventricle has no regional wall  motion abnormalities. There is mild  asymmetric left ventricular hypertrophy of the basal-septal segment. Left  ventricular diastolic parameters were normal.   2. Right ventricular systolic function is normal. The right ventricular  size is normal. Tricuspid regurgitation signal is inadequate for assessing  PA pressure.   3. The mitral valve is grossly normal. Trivial mitral valve  regurgitation. No evidence of mitral stenosis.   4. The aortic valve is tricuspid. There is mild calcification of the  aortic valve. Aortic valve regurgitation is trivial. Aortic valve  sclerosis is present, with no evidence of aortic valve stenosis.   5. The inferior vena cava is normal in size with greater than 50%  respiratory variability, suggesting right atrial pressure of 3 mmHg.   Patient Profile  66 year old female with history of CKD stage V, diabetes, hypertension who was admitted on 01/01/2022 for worsening hyponatremia with concerns for atrial fibrillation.  Assessment & Plan   #Tachycardia  #Atrial tachycardia vs atrial fibrillation -Echo shows normal EF. TSH normal. Tele shows SR with PACs. No Afib on my review.  -increase metoprolol succinate to 50 mg BID -suspect arrhythmia driven by renal failure and acidosis. Also volume up. Renal following.  -not a candidate for [facility] given anemia and vaginal bleeding/concerns for endometrial cancer. Again, no definitive Afib on my review either so would hold -would DC with a monitor to look for fib.   #CKD V #Volume overload -per renal  #HFpEF -bigger issue is renal failure   #Symptomatic Anemia  #Vaginal bleeding/Concern for endometrial CA -No AC  [facility] will  sign off.   Medication Recommendations:  as above Other recommendations (labs, testing, etc):  none Follow up as an outpatient:  We will arrange outpatient follow-up in 4-6 weeks.   For questions or updates, please contact [place] Please consult [email or web address] for contact info under   Signed, [place] T. Audie Box, MD, [place]  01/04/2022 9:21 AM

## 2022-01-04 NOTE — Progress Notes (Signed)
[facility]  ROUNDING NOTE   Subjective:   Theresa Warren is a 66 y.o. female with past medical history of hypertension, hyperlipidemia, diabetes, and chronic kidney disease stage V. Patient presents to ed for abnormal labs. She has been admitted for Hyponatremia [E87.1] AKI (acute kidney injury) ([place]) [N17.9] Symptomatic anemia [D64.9] Anemia, unspecified type [D64.9] Severe sepsis ([place]) [A41.9, R65.20]  Patient is known to our clinic from previous admissions. She is followed by Dr Arty Baumgartner in [place] at [facility]. She states she recently was seen in office and advised to come to hospital for abnormal sodium labs. Patient states she was at [facility] receiving rehab, able to ambulate with assistance. She denies weakness, loss of appetite, or nausea and vomiting. Reports pain from sacral wound. Denies shortness of breath. States nephrology was preparing for vein mapping to prepare for dialysis in the future. She continues to make urine.    Update:  Patient resting comfortably today. Creatinine slightly higher today at 3.98 with an EGFR of 12.    Objective:  Vital signs in last 24 hours:  Temp:  [98 F (36.7 C)-98.8 F (37.1 C)] 98 F (36.7 C) (06/04 1124) Pulse Rate:  [71-95] 93 (06/04 1124) Resp:  [16-20] 18 (06/04 1124) BP: (124-151)/(53-78) 124/53 (06/04 1124) SpO2:  [97 %-99 %] 98 % (06/04 1124)  Weight change:  Filed Weights   12/31/21 1824  Weight: 97 kg    Intake/Output: I/O last 3 completed shifts: In: 1010.3 [P.O.:600; IV Piggyback:410.3] Out: 2800 [Urine:4250]   Intake/Output this shift:  No intake/output data recorded.  Physical Exam: General: NAD  Head: Normocephalic, atraumatic. Moist oral mucosal membranes  Eyes: Anicteric  Lungs:  Clear to auscultation, normal effort, room air  Heart: Regular rhythm, tachycardia  Abdomen:  Soft, nontender, obese  Extremities: 3+ peripheral edema.  Neurologic: Nonfocal, moving  all four extremities  Skin: No acute rash  Access: None    Basic Metabolic Panel: Recent Labs  Lab 01/01/22 0202 01/01/22 0752 01/01/22 1333 01/01/22 1446 01/02/22 0703 01/03/22 0613 01/04/22 0618  NA 132*  --  133* 133* 136 135 133*  K 5.1  --  4.5 4.6 4.7 4.6 4.4  CL 105  --  103 103 108 105 105  CO2 17*  --  17*  --  17* 19* 18*  GLUCOSE 243*  --  168* 157* 100* 92 244*  BUN 98*  --  95* 88* 89* 94* 99*  CREATININE 4.77*  --  4.48* 4.80* 4.12* 3.88* 3.98*  CALCIUM 8.0*  --  8.1*  --  8.6* 8.6* 9.1  MG  --  1.0* 2.0  --  1.7 1.6* 2.1     Liver Function Tests: Recent Labs  Lab 12/31/21 1826 01/01/22 0202 01/02/22 0703 01/03/22 0613  AST 192* 182* 65* 184*  ALT 116* 107* 67* 93*  ALKPHOS 229* 212* 175* 304*  BILITOT 0.5 0.7 0.9 0.6  PROT 7.0 6.3* 6.5 5.8*  ALBUMIN 2.3* 2.1* 2.0* 1.9*    No results for input(s): LIPASE, AMYLASE in the last 168 hours. No results for input(s): AMMONIA in the last 168 hours.  CBC: Recent Labs  Lab 01/01/22 0202 01/01/22 0752 01/01/22 1446 01/02/22 0703 01/03/22 0613 01/04/22 0618  WBC 17.6* 13.8*  --  16.4* 13.0* 11.5*  NEUTROABS  --  11.1*  --   --   --   --   HGB 6.3* 6.6* 8.5* 8.5* 8.0* 7.9*  HCT 19.9* 20.5* 25.0* 25.9* 24.6* 23.9*  MCV 97.5 94.5  --  92.2 92.5 91.2  PLT 410* 335  --  405* 360 347     Cardiac Enzymes: No results for input(s): CKTOTAL, CKMB, CKMBINDEX, TROPONINI in the last 168 hours.  BNP: Invalid input(s): POCBNP  CBG: Recent Labs  Lab 01/03/22 1212 01/03/22 1715 01/03/22 2049 01/04/22 0808 01/04/22 1122  GLUCAP 104* 163* 231* 231* 192*     Microbiology: Results for orders placed or performed during the hospital encounter of 12/31/21  Blood culture (routine x 2)     Status: None (Preliminary result)   Collection Time: 12/31/21 11:35 PM   Specimen: BLOOD  Result Value Ref Range Status   Specimen Description BLOOD RIGHT ARM  Final   Special Requests   Final    BOTTLES DRAWN  AEROBIC AND ANAEROBIC Blood Culture adequate volume   Culture   Final    NO GROWTH 3 DAYS Performed at [facility], [address] [place], [place] [address]    Report Status PENDING  Incomplete  Blood culture (routine x 2)     Status: None (Preliminary result)   Collection Time: 12/31/21 11:49 PM   Specimen: BLOOD  Result Value Ref Range Status   Specimen Description BLOOD RIGHT HAND  Final   Special Requests   Final    BOTTLES DRAWN AEROBIC AND ANAEROBIC Blood Culture adequate volume   Culture   Final    NO GROWTH 3 DAYS Performed at [facility], [address], [place], [place] [address]    Report Status PENDING  Incomplete  MRSA Next Gen by PCR, Nasal     Status: None   Collection Time: 01/01/22 10:17 AM   Specimen: Nasal Mucosa; Nasal Swab  Result Value Ref Range Status   MRSA by PCR Next Gen NOT DETECTED NOT DETECTED Final    Comment: (NOTE) The GeneXpert MRSA Assay (FDA approved for NASAL specimens only), is one component of a comprehensive MRSA colonization surveillance program. It is not intended to diagnose MRSA infection nor to guide or monitor treatment for MRSA infections. Test performance is not FDA approved in patients less than 6 years old. Performed at [facility], [place], [place] [address]   Aerobic/Anaerobic Culture w Gram Stain (surgical/deep wound)     Status: None (Preliminary result)   Collection Time: 01/01/22  3:37 PM   Specimen: PATH Soft tissue  Result Value Ref Range Status   Specimen Description TISSUE  Final   Special Requests BUTTOCKS WOUND  Final   Gram Stain   Final    NO SQUAMOUS EPITHELIAL CELLS SEEN FEW WBC SEEN RARE GRAM POSITIVE RODS FEW GRAM POSITIVE COCCI    Culture   Final    FEW STAPHYLOCOCCUS AUREUS RARE ENTEROCOCCUS FAECALIS NO ANAEROBES ISOLATED; CULTURE IN PROGRESS FOR 5 DAYS SUSCEPTIBILITIES TO FOLLOW Performed at [facility], [address], [place], [place]  [address]    Report Status PENDING  Incomplete   Organism ID, Bacteria STAPHYLOCOCCUS AUREUS  Final      Susceptibility   Staphylococcus aureus - MIC*    CIPROFLOXACIN >=8 RESISTANT Resistant     ERYTHROMYCIN >=8 RESISTANT Resistant     GENTAMICIN <=0.5 SENSITIVE Sensitive     OXACILLIN <=0.25 SENSITIVE Sensitive     TETRACYCLINE <=1 SENSITIVE Sensitive     VANCOMYCIN 1 SENSITIVE Sensitive     TRIMETH/SULFA <=10 SENSITIVE Sensitive     CLINDAMYCIN >=8 RESISTANT Resistant     RIFAMPIN <=0.5 SENSITIVE Sensitive  Inducible Clindamycin NEGATIVE Sensitive     * FEW STAPHYLOCOCCUS AUREUS  Aerobic/Anaerobic Culture w Gram Stain (surgical/deep wound)     Status: None (Preliminary result)   Collection Time: 01/01/22  3:46 PM   Specimen: PATH Soft tissue  Result Value Ref Range Status   Specimen Description TISSUE  Final   Special Requests BUTTOCKS WOUND NO 2  Final   Gram Stain   Final    NO SQUAMOUS EPITHELIAL CELLS SEEN FEW WBC SEEN FEW GRAM POSITIVE COCCI    Culture   Final    MODERATE STAPHYLOCOCCUS AUREUS FEW STAPHYLOCOCCUS EPIDERMIDIS NO ANAEROBES ISOLATED; CULTURE IN PROGRESS FOR 5 DAYS SUSCEPTIBILITIES TO FOLLOW Performed at [facility], [place] [address] [place], [place] [address]    Report Status PENDING  Incomplete   Organism ID, Bacteria STAPHYLOCOCCUS AUREUS  Final      Susceptibility   Staphylococcus aureus - MIC*    CIPROFLOXACIN >=8 RESISTANT Resistant     ERYTHROMYCIN >=8 RESISTANT Resistant     GENTAMICIN <=0.5 SENSITIVE Sensitive     OXACILLIN <=0.25 SENSITIVE Sensitive     TETRACYCLINE <=1 SENSITIVE Sensitive     VANCOMYCIN 1 SENSITIVE Sensitive     TRIMETH/SULFA <=10 SENSITIVE Sensitive     CLINDAMYCIN >=8 RESISTANT Resistant     RIFAMPIN <=0.5 SENSITIVE Sensitive     Inducible Clindamycin NEGATIVE Sensitive     * MODERATE STAPHYLOCOCCUS AUREUS    Coagulation Studies: No results for input(s): LABPROT, INR in the last 72  hours.   Urinalysis: Recent Labs    01/02/22 0038  COLORURINE YELLOW*  LABSPEC 1.013  PHURINE 5.0  GLUCOSEU NEGATIVE  HGBUR SMALL*  BILIRUBINUR NEGATIVE  KETONESUR NEGATIVE  PROTEINUR 100*  NITRITE NEGATIVE  LEUKOCYTESUR NEGATIVE       Imaging: ECHOCARDIOGRAM COMPLETE  Result Date: 01/03/2022    ECHOCARDIOGRAM REPORT   Patient Name:   [facility] Date of Exam: 01/03/2022 Medical Rec #:  [id number]              Height:       63.0 in Accession #:    [id number]             Weight:       213.8 lb Date of Birth:  [birth year]              BSA:          1.989 m Patient Age:    32 years               BP:           129/62 mmHg Patient Gender: F                      HR:           88 bpm. Exam Location:  [facility] Procedure: 2D Echo Indications:     Atrial Fibrillation I48.91  History:         Patient has no prior history of Echocardiogram examinations.  Sonographer:     Kathlen Brunswick RDCS Referring Phys:  [id number] SHERI HAMMOCK Diagnosing Phys: Eleonore Chiquito MD IMPRESSIONS  1. Left ventricular ejection fraction, by estimation, is 60 to 65%. Left ventricular ejection fraction by 2D MOD biplane is 63.2 %. The left ventricle has normal function. The left ventricle has no regional wall motion abnormalities. There is mild asymmetric left ventricular hypertrophy of the basal-septal segment. Left ventricular diastolic parameters were normal.  2. Right ventricular  systolic function is normal. The right ventricular size is normal. Tricuspid regurgitation signal is inadequate for assessing PA pressure.  3. The mitral valve is grossly normal. Trivial mitral valve regurgitation. No evidence of mitral stenosis.  4. The aortic valve is tricuspid. There is mild calcification of the aortic valve. Aortic valve regurgitation is trivial. Aortic valve sclerosis is present, with no evidence of aortic valve stenosis.  5. The inferior vena cava is normal in size with greater than 50% respiratory variability, suggesting  right atrial pressure of 3 mmHg. FINDINGS  Left Ventricle: Left ventricular ejection fraction, by estimation, is 60 to 65%. Left ventricular ejection fraction by 2D MOD biplane is 63.2 %. The left ventricle has normal function. The left ventricle has no regional wall motion abnormalities. The left ventricular internal cavity size was normal in size. There is mild asymmetric left ventricular hypertrophy of the basal-septal segment. Left ventricular diastolic parameters were normal. Right Ventricle: The right ventricular size is normal. No increase in right ventricular wall thickness. Right ventricular systolic function is normal. Tricuspid regurgitation signal is inadequate for assessing PA pressure. Left Atrium: Left atrial size was normal in size. Right Atrium: Right atrial size was normal in size. Pericardium: There is no evidence of pericardial effusion. Mitral Valve: The mitral valve is grossly normal. Trivial mitral valve regurgitation. No evidence of mitral valve stenosis. Tricuspid Valve: The tricuspid valve is grossly normal. Tricuspid valve regurgitation is not demonstrated. No evidence of tricuspid stenosis. Aortic Valve: The aortic valve is tricuspid. There is mild calcification of the aortic valve. Aortic valve regurgitation is trivial. Aortic valve sclerosis is present, with no evidence of aortic valve stenosis. Aortic valve peak gradient measures 7.8 mmHg. Pulmonic Valve: The pulmonic valve was grossly normal. Pulmonic valve regurgitation is not visualized. No evidence of pulmonic stenosis. Aorta: The aortic root is normal in size and structure. Venous: The inferior vena cava is normal in size with greater than 50% respiratory variability, suggesting right atrial pressure of 3 mmHg. IAS/Shunts: The atrial septum is grossly normal.  LEFT VENTRICLE PLAX 2D                        Biplane EF (MOD) LVIDd:         3.45 cm         LV Biplane EF:   Left LVIDs:         2.37 cm                           ventricular LV PW:         1.43 cm                          ejection LV IVS:        1.24 cm                          fraction by                                                 2D MOD  biplane is LV Volumes (MOD)                                63.2 %. LV vol d, MOD    65.4 ml A2C:                           Diastology LV vol d, MOD    78.7 ml       LV e' medial:    7.72 cm/s A4C:                           LV E/e' medial:  13.6 LV vol s, MOD    24.5 ml       LV e' lateral:   8.16 cm/s A2C:                           LV E/e' lateral: 12.9 LV vol s, MOD    27.2 ml A4C: LV SV MOD A2C:   40.9 ml LV SV MOD A4C:   78.7 ml LV SV MOD BP:    45.4 ml RIGHT VENTRICLE RV Basal diam:  3.07 cm RV S prime:     16.00 cm/s TAPSE (M-mode): 2.2 cm LEFT ATRIUM             Index        RIGHT ATRIUM           Index LA diam:        4.20 cm 2.11 cm/m   RA Area:     10.30 cm LA Vol (A2C):   24.3 ml 12.21 ml/m  RA Volume:   20.60 ml  10.35 ml/m LA Vol (A4C):   33.9 ml 17.04 ml/m LA Biplane Vol: 30.7 ml 15.43 ml/m  AORTIC VALVE              PULMONIC VALVE AV Vmax:      140.00 cm/s PV Vmax:       1.05 m/s AV Peak Grad: 7.8 mmHg    PV Peak grad:  4.4 mmHg LVOT Vmax:    102.00 cm/s LVOT Vmean:   67.500 cm/s LVOT VTI:     0.241 m MITRAL VALVE MV Area (PHT): 4.06 cm     SHUNTS MV Decel Time: 187 msec     Systemic VTI: 0.24 m MV E velocity: 105.00 cm/s MV A velocity: 84.40 cm/s MV E/A ratio:  1.24 Eleonore Chiquito MD Electronically signed by Eleonore Chiquito MD Signature Date/Time: 01/03/2022/4:18:04 PM    Final      Medications:    albumin human 25 g (01/04/22 0827)   vancomycin 500 mg (01/04/22 1047)    vitamin C  500 mg Oral BID   atorvastatin  40 mg Oral Daily   fluticasone  2 spray Each Nare Daily   furosemide  60 mg Intravenous BID   gabapentin  100 mg Oral TID   insulin aspart  0-5 Units Subcutaneous QHS   insulin aspart  0-9 Units Subcutaneous TID WC   insulin glargine-yfgn  20 Units  Subcutaneous BID   loratadine  10 mg Oral Daily   metoprolol succinate  50 mg Oral BID   multivitamin with minerals  1 tablet Oral Daily   nutrition supplement (JUVEN)  1 packet Oral BID BM   Ensure Max Protein  11  oz Oral QHS   sodium bicarbonate  1,300 mg Oral BID   zinc sulfate  220 mg Oral Daily   hydrALAZINE, hydrALAZINE, morphine injection, ondansetron **OR** ondansetron (ZOFRAN) IV, oxyCODONE  Assessment/ Plan:  [name] is a 66 y.o.  female with past medical history of hypertension, hyperlipidemia, diabetes, and chronic kidney disease stage V. Patient presents to ed for abnormal labs. She has been admitted for Hyponatremia [E87.1] AKI (acute kidney injury) ([place]) [N17.9] Symptomatic anemia [D64.9] Anemia, unspecified type [D64.9] Severe sepsis ([place]) [A41.9, R65.20]   Acute Kidney Injury with hyponatremia on chronic kidney disease stage V with baseline creatinine 3.91 and GFR of 12 on 12/23/21.  Acute kidney injury secondary to underlying illness Chronic kidney disease is secondary to diabetes and hypertension No exposure to IV contrast.  CT abdomen pelvis negative for obstruction, several renal stones identified.  Losartan and furosemide  held since admission.    Renal function about the same however patient remains volume overloaded.  Urine output was 3.2 L over the preceding 24 hours.  Transition the patient to Lasix drip at 6 mg/h.  Lab Results  Component Value Date   CREATININE 3.98 (H) 01/04/2022   CREATININE 3.88 (H) 01/03/2022   CREATININE 4.12 (H) 01/02/2022    Intake/Output Summary (Last 24 hours) at 01/04/2022 1425 Last data filed at 01/04/2022 0545 Gross per 24 hour  Intake 394.82 ml  Output 2200 ml  Net -1805.18 ml    2. Anemia of chronic kidney disease Lab Results  Component Value Date   HGB 7.9 (L) 01/04/2022  Hemoglobin relatively stable posttransfusion at 7.9.  Consider Epogen as outpatient.  3. Secondary Hyperparathyroidism:  Lab  Results  Component Value Date   CALCIUM 9.1 01/04/2022   CAION 1.11 (L) 01/01/2022  Calcium within acceptable range.  We will continue to monitor.  4.  Acute metabolic acidosis.  Serum bicarbonate currently 18.  Continue sodium bicarbonate repletion.  5.  Generalized edema.  Switch the patient from IV Lasix to Lasix drip at 6 mg/h.    LOS: 3 Theresa Warren 6/4/20232:25 PM

## 2022-01-04 NOTE — Progress Notes (Signed)
[facility] Day(s): 3.   Post op day(s): 3 Days Post-Op.   Interval History:  Patient seen and examined No acute events or new complaints overnight.  Patient reports a cramp pain in her buttock;  No fever, chills Wound Cx Staph aureus She continues on Cefepime, Vancomycin Wound vac in place; WOC on board for change tomorrow, Monday 06/05  Vital signs in last 24 hours: [min-max] current  Temp:  [98 F (36.7 C)-98.8 F (37.1 C)] 98 F (36.7 C) (06/04 1124) Pulse Rate:  [71-95] 93 (06/04 1124) Resp:  [16-20] 18 (06/04 1124) BP: (124-151)/(53-78) 124/53 (06/04 1124) SpO2:  [97 %-99 %] 98 % (06/04 1124)     Height: 5\' 3"  (160 cm) Weight: 97 kg BMI (Calculated): 37.89   Intake/Output last 2 shifts:  06/03 0701 - 06/04 0700 In: 514.8 [P.O.:360; IV Piggyback:154.8] Out: 3200 [Urine:3200]   Physical Exam:  Constitutional: alert, cooperative and no distress  Respiratory: breathing non-labored at rest  Cardiovascular: regular rate and sinus rhythm  Integumentary: Wound vac to the bilateral buttock, bridged to the left anterior hip. This has a good seal. Minimal SS drainage in canister  Labs:     Latest Ref Rng & Units 01/04/2022    6:18 AM 01/03/2022    6:13 AM 01/02/2022    7:03 AM  CBC  WBC 4.0 - 10.5 K/uL 11.5   13.0   16.4    Hemoglobin 12.0 - 15.0 g/dL 7.9   8.0   8.5    Hematocrit 36.0 - 46.0 % 23.9   24.6   25.9    Platelets 150 - 400 K/uL 347   360   405        Latest Ref Rng & Units 01/04/2022    6:18 AM 01/03/2022    6:13 AM 01/02/2022    7:03 AM  CMP  Glucose 70 - 99 mg/dL 244   92   100    BUN 8 - 23 mg/dL 99   94   89    Creatinine 0.44 - 1.00 mg/dL 3.98   3.88   4.12    Sodium 135 - 145 mmol/L 133   135   136    Potassium 3.5 - 5.1 mmol/L 4.4   4.6   4.7    Chloride 98 - 111 mmol/L 105   105   108    CO2 22 - 32 mmol/L 18   19   17     Calcium 8.9 - 10.3 mg/dL 9.1   8.6   8.6    Total Protein 6.5 - 8.1 g/dL  5.8    6.5    Total Bilirubin 0.3 - 1.2 mg/dL  0.6   0.9    Alkaline Phos 38 - 126 U/L  304   175    AST 15 - 41 U/L  184   65    ALT 0 - 44 U/L  93   67       Imaging studies: No new pertinent imaging studies   Assessment/Plan: 66 y.o. female 3 Days Post-Op s/p debridement of three decubitus wounds of bilateral buttocks (total area 133 cm2) and placement of negative pressure dressing (> 50 cm).    - Continue wound vac; change MWF; [place] RN on-board to assist with changes starting Monday 06/05, greatly appreciate their assistance  - Continue IV Abx (Cefepime, Vancomycin); Cx from OR Staph aureus            -  Local wound care; pressure offload, frequent repositioning, low air loss mattress            - Pain control prn            - Further management per primary service; we will follow   All of the above findings and recommendations were discussed with the patient, and the medical team, and all of patient's questions were answered to her expressed satisfaction.  -- Ronny Bacon, MD [facility] 01/04/2022, 2:04 PM

## 2022-01-04 NOTE — Progress Notes (Addendum)
Progress Note   Patient: Theresa Warren RJJ:[id number] DOB: [birth year] DOA: 12/31/2021     3 DOS: the patient was seen and examined on 01/04/2022   Brief hospital course: Ms. Cait Locust is a 66 year old female hyperlipidemia, hypertension, CKD stage V, insulin-dependent diabetes mellitus, who presents emergency department from [facility] for evaluation of low sodium noted on labs at the facility.  She was discharged to rehab on 5/23 after being admitted following a fall and also with acute on chronic anemia.  ED Course - temp 99.6, HR 107, otherwise unremarkable vitals. Labs with sodium 129, potassium 5.1, chloride 99, bicarb 17, BUN of 100, serum creatinine of 4.61, GFR of 10, WBC 18.4, hemoglobin 6.9, platelets of 473.  LFT's were also elevated.  Patient met criteria for sepsis on arrival, treated per protocol in the ED with IV Cefepime, Flagyl, Vanc, and LR 1 L bolus fluids.  Infection source unclear but felt possibly due to sacral wound infection and concern for underlying osteomyelitis.  Admitted to the hospitalist service for further evaluation and management.  Continued on IV Cefepime and Vanc.   General surgery was consulted for debridement on sacral pressure injury.  Nephrology consulted due to worsening renal function.   Assessment and Plan: * Severe sepsis ([place]) POA as evidenced by tachycardia, leukocytosis, AKI and transaminitis consistent with organ dysfunction and severe sepsis with suspected infection source being sacral wounds versus an abdominal source (CT abdomen pelvis negative for anything acute however).  MRSA PCR negative.  Wound tissue cultures growing few GPC's and GPR's  --Continue IV vancomycin  --Stop cefepime -- Continue IV fluids for now -- Follow blood and wound cultures -- Monitor for signs and symptoms of other infection -- Monitor wounds closely  Type 2 diabetes mellitus with chronic kidney disease, with long-term current use of insulin  (HCC) Sliding scale NovoLog. Semglee 20 units twice daily. Adjust insulin for inpatient goal 140-180  DNR (do not resuscitate)/DNI(Do Not Intubate) Admitting hospitalist confirmed DNR CODE STATUS with patient during admission encounter  Essential hypertension Losartan held due to AKI. As needed hydralazine for now.  BPs overall stable with soft diastolics  Chronic kidney disease, stage 5 (HCC) See AKI.  Nephrology following. On IV Lasix and albumin per nephrology. Bilateral lower extremity edema is improving.  Sacral wound, initial encounter See pressure injury  AKI (acute kidney injury) ([place]) Superimposed on CKD stage V.  Suspect prerenal secondary to poor p.o. intake versus intrarenal secondary to sepsis.  Treated with 1 L bolus LR in the ED.  In chart review, appears discussions have been initiated regarding dialysis. -- Consult nephrology --Continue IV fluids for now - Daily BMP -- Avoid nephrotoxins, hypotension -- Renally dose meds  Abdominal pain Patient reported on admission.  CT of abdomen pelvis showed abnormal endometrial thickening for a postmenopausal patient, bladder distention estimated 520 mL, no other acute or inflammatory processes or acute findings. --Monitor clinically for now -- Needs OB/GYN follow-up for endometrial sampling to exclude endometrial carcinoma  Swelling of left lower extremity With redness, patient attributes it to clindamycin.  She reported on admission that this has been present for over one month.   Left lower extremity Doppler US on 12/18/21 was negative for DVT.  Monitor closely for now.  Hyponatremia Resolved.  Presented with sodium 129.  This was noted on labs at [facility] and was the reason patient presented to the ED. improved with IV hydration. --Continue IV fluids -- Monitor BMP --Monitor volume status closely  Na: 136 >>  133   Symptomatic anemia Hemoglobin on admission was 6.9 >> 6.3, down from 7.6 at time of discharge on 5/23.   No signs of active bleeding, did have vaginal/endometrial bleeding previous admission but no reports of this recently.  May have slow chronic blood loss from sacral wounds.  Has baseline anemia of chronic disease due to renal failure. Received 1 unit PRBC transfusion on admission. -- Transfusing another unit RBCs today -- Trend hemoglobin and transfuse if less than 7  Hypomagnesemia Resolved with replacement on 6/3. Monitor and replace Mg as needed.   Premature atrial complexes Patient became significantly tachycardic on 6/2 with heart rates sustaining in the 130s.  Cardiology reviewed telemetry and feels patient dysrhythmias were not A-fib but sinus tach with PACs. --Cardiology following -- Continue Lopressor 25 mg BID -- Continue monitoring telemetry  Elevated LFTs On admission, alk phos 212, AST 182, ALT 107.  Unclear etiology, repeat LFTs not available today.  Appears LFTs were elevated at time of discharge on 5/23 but have increased since. RUQ ultrasound unremarkable. Acute hepatitis panel negative. -- Follow CMP  Pressure injury of skin Present on admission.  Patient has multiple sacral pressure injuries.  Some concern at time of admission that these were the source of sepsis. General surgery consulted and took patient to the OR on 6/1 for debridement.   Now with wound VAC. --Initial wound VAC change Monday 6/5 then every M/W/F -- Wound care consulted -- Frequent repositioning --Follow general surgery recommendations --Pain control --Follow blood cultures Pressure Injury 01/01/22 Buttocks Left Stage 3 -  Full thickness tissue loss. Subcutaneous fat may be visible but bone, tendon or muscle are NOT exposed. (Active)  01/01/22 0222  Location: Buttocks  Location Orientation: Left  Staging: Stage 3 -  Full thickness tissue loss. Subcutaneous fat may be visible but bone, tendon or muscle are NOT exposed.  Wound Description (Comments):   Present on Admission: Yes     Pressure  Injury 01/01/22 Buttocks Right Stage 2 -  Partial thickness loss of dermis presenting as a shallow open injury with a red, pink wound bed without slough. (Active)  01/01/22 0223  Location: Buttocks  Location Orientation: Right  Staging: Stage 2 -  Partial thickness loss of dermis presenting as a shallow open injury with a red, pink wound bed without slough.  Wound Description (Comments):   Present on Admission: Yes     Pressure Injury 01/01/22 Hip Left Stage 2 -  Partial thickness loss of dermis presenting as a shallow open injury with a red, pink wound bed without slough. (Active)  01/01/22 0225  Location: Hip  Location Orientation: Left  Staging: Stage 2 -  Partial thickness loss of dermis presenting as a shallow open injury with a red, pink wound bed without slough.  Wound Description (Comments):   Present on Admission: Yes     Pressure Injury 01/01/22 Hip Right Stage 3 -  Full thickness tissue loss. Subcutaneous fat may be visible but bone, tendon or muscle are NOT exposed. (Active)  01/01/22 0225  Location: Hip  Location Orientation: Right  Staging: Stage 3 -  Full thickness tissue loss. Subcutaneous fat may be visible but bone, tendon or muscle are NOT exposed.  Wound Description (Comments):   Present on Admission: Yes      OSA (obstructive sleep apnea) .  Appears not to be on CPAP        Subjective: Patient awake resting in bed when seen on rounds.  She reported pain currently  controlled.  Has not had any bowel movement but does say she is having a lot of gas.  No nausea vomiting or abdominal pain however.  Reports good urine output with Lasix.  No other acute complaints at this time.  Physical Exam: Vitals:   01/04/22 0811 01/04/22 1020 01/04/22 1124 01/04/22 1556  BP: 130/78 (!) 149/60 (!) 124/53 (!) 145/64  Pulse: 88 95 93 94  Resp: 18 17 18 18   Temp: 98.8 F (37.1 C)  98 F (36.7 C) 97.9 F (36.6 C)  TempSrc:      SpO2: 97%  98% 97%  Weight:      Height:        General exam: awake, alert, no acute distress, obese HEENT: Moist mucous membranes, hearing grossly normal Respiratory system: Lungs clear bilaterally, normal respiratory effort on room air Cardiovascular system: Irregular rhythm, regular rate, right greater than left lower extremity edema.   Central nervous system: A&O x3. no gross focal neurologic deficits, normal speech Extremities: Improving bilateral lower extremity edema Skin: Lower extremity venous stasis changes, skin dry intact no rashes seen on visualized skin Psychiatry: normal mood, congruent affect, judgement and insight appear normal   Data Reviewed:  Notable labs: Sodium 133, bicarb 18, glucose 244, benign, creatinine 3.98, GFR 12, WBC improved 11.5, hemoglobin stable 7.9.  TSH normal 1.975  Echocardiogram 6/3 --- LVEF 60 to 65%, no regional WMA's,, normal diastolic parameters, no significant valvular disease  Family Communication: None  Disposition: Status is: Inpatient Remains inpatient appropriate because: Severity of illness with anemia requiring transfusion, requiring IV therapies, IV antibiotics pending cultures.  Pending d/c clearance by consultants.   Planned Discharge Destination: Skilled nursing facility    Time spent: 35 minutes   Author: Ezekiel Slocumb, DO 01/04/2022 4:18 PM  For on call review [email or web address].

## 2022-01-05 ENCOUNTER — Other Ambulatory Visit (HOSPITAL_COMMUNITY): Payer: Self-pay

## 2022-01-05 DIAGNOSIS — L89323 Pressure ulcer of left buttock, stage 3: Secondary | ICD-10-CM | POA: Diagnosis not present

## 2022-01-05 DIAGNOSIS — R6520 Severe sepsis without septic shock: Secondary | ICD-10-CM

## 2022-01-05 DIAGNOSIS — A419 Sepsis, unspecified organism: Secondary | ICD-10-CM | POA: Diagnosis not present

## 2022-01-05 DIAGNOSIS — N049 Nephrotic syndrome with unspecified morphologic changes: Secondary | ICD-10-CM

## 2022-01-05 DIAGNOSIS — L89313 Pressure ulcer of right buttock, stage 3: Secondary | ICD-10-CM | POA: Diagnosis not present

## 2022-01-05 LAB — TYPE AND SCREEN
ABO/RH(D): O POS
Antibody Screen: NEGATIVE
Unit division: 0
Unit division: 0

## 2022-01-05 LAB — COMPREHENSIVE METABOLIC PANEL
ALT: 76 U/L — ABNORMAL HIGH (ref 0–44)
AST: 125 U/L — ABNORMAL HIGH (ref 15–41)
Albumin: 3 g/dL — ABNORMAL LOW (ref 3.5–5.0)
Alkaline Phosphatase: 286 U/L — ABNORMAL HIGH (ref 38–126)
Anion gap: 12 (ref 5–15)
BUN: 98 mg/dL — ABNORMAL HIGH (ref 8–23)
CO2: 20 mmol/L — ABNORMAL LOW (ref 22–32)
Calcium: 10 mg/dL (ref 8.9–10.3)
Chloride: 102 mmol/L (ref 98–111)
Creatinine, Ser: 3.93 mg/dL — ABNORMAL HIGH (ref 0.44–1.00)
GFR, Estimated: 12 mL/min — ABNORMAL LOW (ref >60)
Glucose, Bld: 193 mg/dL — ABNORMAL HIGH (ref 70–99)
Potassium: 4.8 mmol/L (ref 3.5–5.1)
Sodium: 134 mmol/L — ABNORMAL LOW (ref 135–145)
Total Bilirubin: 0.5 mg/dL (ref 0.3–1.2)
Total Protein: 6.8 g/dL (ref 6.5–8.1)

## 2022-01-05 LAB — MAGNESIUM: Magnesium: 2 mg/dL (ref 1.7–2.4)

## 2022-01-05 LAB — CBC
HCT: 26.4 % — ABNORMAL LOW (ref 36.0–46.0)
Hemoglobin: 8.4 g/dL — ABNORMAL LOW (ref 12.0–15.0)
MCH: 29.4 pg (ref 26.0–34.0)
MCHC: 31.8 g/dL (ref 30.0–36.0)
MCV: 92.3 fL (ref 80.0–100.0)
Platelets: 393 10*3/uL (ref 150–400)
RBC: 2.86 MIL/uL — ABNORMAL LOW (ref 3.87–5.11)
RDW: 15.1 % (ref 11.5–15.5)
WBC: 10.5 10*3/uL (ref 4.0–10.5)
nRBC: 0 % (ref 0.0–0.2)

## 2022-01-05 LAB — GLUCOSE, CAPILLARY
Glucose-Capillary: 197 mg/dL — ABNORMAL HIGH (ref 70–99)
Glucose-Capillary: 253 mg/dL — ABNORMAL HIGH (ref 70–99)
Glucose-Capillary: 240 mg/dL — ABNORMAL HIGH (ref 70–99)
Glucose-Capillary: 232 mg/dL — ABNORMAL HIGH (ref 70–99)

## 2022-01-05 LAB — BPAM RBC
Blood Product Expiration Date: 202306292359
Blood Product Expiration Date: 202306302359
ISSUE DATE / TIME: 202306011132
Unit Type and Rh: 5100
Unit Type and Rh: 5100

## 2022-01-05 MED ORDER — CEPHALEXIN 250 MG PO CAPS
250.0000 mg | ORAL_CAPSULE | Freq: Three times a day (TID) | ORAL | Status: AC
  Administered 2022-01-05 – 2022-01-11 (×16): 250 mg via ORAL
  Filled 2022-01-05 (×17): qty 1

## 2022-01-05 NOTE — Consult Note (Addendum)
[facility] Nurse Consult Note: Reason for Consult: Consult requested to change Vac dressings.  Surgical PA at the bedside to assess wounds during the first post-op dressing change, Wound type: 3 Stage 4 pressure injuries; 1 to left buttock, one to sacrum and is to left hip.  There is moist yellow slough surrounding the wounds, which are located in the gluteal crease and it is difficult to maintain a seal.  Wounds are pale yellow with small amt pink drainage. Pt was medicated for pain prior to the procedure but it was still painful for her.  Applied 2 barrier rings surrounding to buttock wounds to attempt to maintain a seal, then one piece black foam to each wound and bridged together and track pad placed to left thigh.  Pressure Injury POA: Yes Air mattress for bed ordered to reduce pressure. Dressing procedure/placement/frequency: [place] team will plan to change the dressings again on Wed. Julien Girt MSN, RN, [place], [place], [place]

## 2022-01-05 NOTE — Progress Notes (Signed)
[place] Hospital Day(s): 4.   Post op day(s): 4 Days Post-Op.   Interval History:  Patient seen and examined No acute events or new complaints overnight.  Patient with some nausea this morning after breakfast and with narcotic pain medications for dressing changes; passed wile in room No fever, chills She remains without leukocytosis; 10.5K Hgb improved/stable at 8.4 Renal function at/near baseline; sCr - 3.93; UO - 400 ccs Wound Cx growing staph aureus; sensitivity reviewed She continues on Cefepime, Vancomycin Wound vac in place; [place] on board for changes starting today  Vital signs in last 24 hours: [min-max] current  Temp:  [97.9 F (36.6 C)-98.8 F (37.1 C)] 98.4 F (36.9 C) (06/05 0437) Pulse Rate:  [83-95] 88 (06/05 0437) Resp:  [16-20] 20 (06/05 0032) BP: (124-150)/(53-78) 150/68 (06/05 0437) SpO2:  [93 %-98 %] 93 % (06/05 0437)     Height: 5\' 3"  (160 cm) Weight: 97 kg BMI (Calculated): 37.89   Intake/Output last 2 shifts:  06/04 0701 - 06/05 0700 In: 276.9 [I.V.:33.6; IV Piggyback:243.3] Out: 500 [Urine:400; Drains:100]   Physical Exam:  Constitutional: alert, cooperative and no distress  Respiratory: breathing non-labored at rest  Cardiovascular: regular rate and sinus rhythm  Integumentary: Wound vac to the bilateral buttock, bridged to the left anterior hip. Removed. She has stage 3 wound to the left lateral buttock, healthy appearing, no evidence of necrosis. There is right and left medial gluteal wounds as well, these appear healthy (stage 3), there are smaller surrounding pressure injuries with slough, no evidence of necrosis or infection. Wound vac changed with RN.   Left Lateral Buttock Wound (01/05/2022):   Bilateral gluteal wounds (01/05/2022):     Labs:     Latest Ref Rng & Units 01/05/2022    5:12 AM 01/04/2022    6:18 AM 01/03/2022    6:13 AM  CBC  WBC 4.0 - 10.5 K/uL 10.5   11.5   13.0    Hemoglobin  12.0 - 15.0 g/dL 8.4   7.9   8.0    Hematocrit 36.0 - 46.0 % 26.4   23.9   24.6    Platelets 150 - 400 K/uL 393   347   360        Latest Ref Rng & Units 01/05/2022    5:12 AM 01/04/2022    6:18 AM 01/03/2022    6:13 AM  CMP  Glucose 70 - 99 mg/dL 193   244   92    BUN 8 - 23 mg/dL 98   99   94    Creatinine 0.44 - 1.00 mg/dL 3.93   3.98   3.88    Sodium 135 - 145 mmol/L 134   133   135    Potassium 3.5 - 5.1 mmol/L 4.8   4.4   4.6    Chloride 98 - 111 mmol/L 102   105   105    CO2 22 - 32 mmol/L 20   18   19     Calcium 8.9 - 10.3 mg/dL 10.0   9.1   8.6    Total Protein 6.5 - 8.1 g/dL 6.8    5.8    Total Bilirubin 0.3 - 1.2 mg/dL 0.5    0.6    Alkaline Phos 38 - 126 U/L 286    304    AST 15 - 41 U/L 125    184    ALT 0 - 44 U/L 76  93       Imaging studies: No new pertinent imaging studies   Assessment/Plan: 66 y.o. female 4 Days Post-Op s/p debridement of three decubitus wounds of bilateral buttocks (total area 133 cm2) and placement of negative pressure dressing (> 50 cm).    - Continue wound vac; change MWF; [place] RN on-board to assist with changes starting today, greatly appreciate their assistance. I will be available to assist if needed  - No further surgical intervention at this time. I do worry that her immobility, poor functional status, and poor nutritional status will be significant barrier to wound healing. She has smaller pressure injuries with slough around her debrided wounds. Will follow these closely to ensure no worsening. No evidence of necrosis nor infection on examination today.   - Continue IV Abx (Cefepime, Vancomycin); Cx from OR growing [place] wound care; pressure offload, frequent repositioning, low air loss mattress.Marland KitchenMarland KitchenShe had low air loss mattress at bedside last week, unsure why this was abandoned.            - Pain control prn           - Further management per primary service; we will follow   All of the above findings and  recommendations were discussed with the patient, and the medical team, and all of patient's questions were answered to her expressed satisfaction.  -- Edison Simon, PA-C [facility] 01/05/2022, 7:20 AM M-F: 7am - 4pm

## 2022-01-05 NOTE — Assessment & Plan Note (Signed)
Nephrology following. Treated with Lasix IV, later Lasix drip, IV albumin.  Nephrology switched back to PO Lasix today.  Monitor I/O's & Daily Wts

## 2022-01-05 NOTE — Progress Notes (Signed)
Physical Therapy Treatment Patient Details Name: Theresa Warren MRN: [id number] DOB: October 19, 1955 Today's Date: 01/05/2022   History of Present Illness [name] is a 66 year old female admitted for evaluation of low sodium noted on labs at the facility.  Pt was discharged  to rehab on 5/23 after being admitted following a fall. PMH significant for acute on chronic anemia. hyperlipidemia, hypertension, CKD stage V, insulin-dependent diabetes mellitus    PT Comments    Pt received upright in bed agreeable to PT services. Pt endorses feeling "loopy" from pain meds s/p wound dressing changes on buttocks with difficulty keeping eyes open but following all commands. Pt able to perform supine LE exercises in prep for OOB mobility with partial ranges of motion against gravity with SLR. Pt noted to have soiled bed and gown so soled gown doffed and clean gown donned. RN made aware. With VC's for LE/UE sequencing, pt requiring modA+2 and HOB maximally elevated to transfer to EOB but able to stand to RW with minA+2. Noted void of bladder onto floor with first STS requiring pt to have a seat at EoB to clean floor and doff soiled socks and don clean socks. 4 more additional STS's performed progressing to minguard of +2 with VC's for hand placement to improve glut and quad strength for improved independence with transfers. On final STS pt step pivot transfer to recliner with VC's throughout for sequencing of RW with noted poor eccentric control into seat requiring maxA+2 to scoot upward in recliner.  Buttocks off loaded with pillows for skin integrity. Deferred gait at this time due to pain medication causing pt to feel loops with difficulty keeping eyes open during functional mobility tasks. Will coordinate with RN at following session to attempt ambulation prior to pain medication. D/c recs remain appropriate at this time.    Recommendations for follow up therapy are one component of a  multi-disciplinary discharge planning process, led by the attending physician.  Recommendations may be updated based on patient status, additional functional criteria and insurance authorization.  Follow Up Recommendations  Skilled nursing-short term rehab (<3 hours/day)     Assistance Recommended at Discharge Frequent or constant Supervision/Assistance  Patient can return home with the following A little help with walking and/or transfers;A lot of help with bathing/dressing/bathroom;Assistance with cooking/housework;Help with stairs or ramp for entrance;Assist for transportation   Equipment Recommendations  Rolling walker (2 wheels);BSC/3in1    Recommendations for Other Services       Precautions / Restrictions Precautions Precautions: Fall Restrictions Weight Bearing Restrictions: No     Mobility  Bed Mobility Overal bed mobility: Needs Assistance Bed Mobility: Supine to Sit     Supine to sit: Mod assist, HOB elevated, +2 for physical assistance     General bed mobility comments: VC's for LE and UE positioning and sequencing to EOB Patient Response: Cooperative  Transfers Overall transfer level: Needs assistance Equipment used: Rolling walker (2 wheels) Transfers: Sit to/from Stand Sit to Stand: Min guard, +2 physical assistance           General transfer comment: Initially +2 but able to progress to +1 assist at [facility] level to stand to RW at [facility]    Ambulation/Gait               General Gait Details: Gait deferred due to pain meds limiting safe mobility at this time   [employer]    Modified Rankin (  Stroke Patients Only)       Balance Overall balance assessment: Needs assistance Sitting-balance support: No upper extremity supported, Feet supported Sitting balance-Leahy Scale: Good     Standing balance support: Bilateral upper extremity supported Standing balance-Leahy Scale: Fair                               Cognition Arousal/Alertness: Awake/alert Behavior During Therapy: WFL for tasks assessed/performed Overall Cognitive Status: Within Functional Limits for tasks assessed                                          Exercises General Exercises - Lower Extremity Ankle Circles/Pumps: AROM, Strengthening, Both, 10 reps, Supine Straight Leg Raises: AROM, Strengthening, Both, 5 reps, Supine Other Exercises Other Exercises: x5 STS in room to improve quad/glut strength for safe transfers    General Comments        Pertinent Vitals/Pain Pain Assessment Pain Assessment: Faces Faces Pain Scale: Hurts little more Pain Location: buttocks Pain Intervention(s): Limited activity within patient's tolerance, Monitored during session, Repositioned    Home Living                          Prior Function            PT Goals (current goals can now be found in the care plan section) Acute Rehab PT Goals Patient Stated Goal: to go home PT Goal Formulation: With patient Time For Goal Achievement: 01/17/22 Potential to Achieve Goals: Good Progress towards PT goals: Progressing toward goals    Frequency    Min 2X/week      PT Plan Current plan remains appropriate    Co-evaluation     PT goals addressed during session: Mobility/safety with mobility;Balance;Proper use of DME;Strengthening/ROM        AM-PAC PT "6 Clicks" Mobility   Outcome Measure  Help needed turning from your back to your side while in a flat bed without using bedrails?: A Lot Help needed moving from lying on your back to sitting on the side of a flat bed without using bedrails?: A Lot Help needed moving to and from a bed to a chair (including a wheelchair)?: A Little Help needed standing up from a chair using your arms (e.g., wheelchair or bedside chair)?: A Little Help needed to walk in hospital room?: A Lot Help needed climbing 3-5 steps with a railing? : A Lot 6 Click Score:  14    End of Session Equipment Utilized During Treatment: Gait belt Activity Tolerance: Treatment limited secondary to medical complications (Comment) (pain meds leading to pt feeling "loopy") Patient left: in chair;with call bell/phone within reach;with chair alarm set Nurse Communication: Mobility status PT Visit Diagnosis: Unsteadiness on feet (R26.81);Muscle weakness (generalized) (M62.81);Difficulty in walking, not elsewhere classified (R26.2)     Time: 3704-8889 PT Time Calculation (min) (ACUTE ONLY): 32 min  Charges:  $Therapeutic Exercise: 23-37 mins                    Aaron Boeh M. Fairly IV, PT, DPT Physical Therapist- [facility]  01/05/2022, 2:54 PM

## 2022-01-05 NOTE — Progress Notes (Signed)
Inpatient Diabetes Program Recommendations  AACE/ADA: New Consensus Statement on Inpatient Glycemic Control   Target Ranges:  Prepandial:   less than 140 mg/dL      Peak postprandial:   less than 180 mg/dL (1-2 hours)      Critically ill patients:  140 - 180 mg/dL    Latest Reference Range & Units 01/04/22 08:08 01/04/22 11:22 01/04/22 15:59 01/04/22 21:07 01/05/22 07:42 01/05/22 12:35  Glucose-Capillary 70 - 99 mg/dL 231 (H) 192 (H) 219 (H) 187 (H) 197 (H) 253 (H)    Latest Reference Range & Units 12/18/21 18:37  Hemoglobin A1C 4.8 - 5.6 % 10.0 (H)   Review of Glycemic Control  Diabetes history: DM2 Outpatient Diabetes medications: Humulin R U500 30 unit mark on U100 syringe in the morning, Humulin R U500 25 unit mark on U100 syringe in the evening Current orders for Inpatient glycemic control: Semglee 20 units BID, Novolog 0-9 units TID with meals, Novolog 0-5 units QHS  Inpatient Diabetes Program Recommendations:    Insulin: Please consider ordering Novolog 3 units TID with meals for meal coverage if patient eats at least 50% of meals.  NOTE: Patient has DM2 hx and uses Humulin R U500 insulin (concentrated insulin) outpatient for DM control. Patient was recently inpatient 12/18/21-12/23/21 and inpatient diabetes coordinator spoke to patient on 12/19/21 regarding DM regimen and control. Post prandial glucose is consistently elevated. Would recommend adding Novolog meal coverage if patient is eating at least 50% of meals.  Thanks, Barnie Alderman, RN, MSN, [place] Diabetes Coordinator Inpatient Diabetes Program [phone] (Team Pager from 8am to [place])

## 2022-01-05 NOTE — Progress Notes (Signed)
Progress Note   Patient: Theresa Warren SHF:[id number] DOB: 10-21-1955 DOA: 12/31/2021     4 DOS: the patient was seen and examined on 01/05/2022   Brief hospital course: [name] is a 66 year old female hyperlipidemia, hypertension, CKD stage V, insulin-dependent diabetes mellitus, who presents emergency department from [facility] for evaluation of low sodium noted on labs at the facility.  She was discharged to rehab on 5/23 after being admitted following a fall and also with acute on chronic anemia.  ED Course - temp 99.6, HR 107, otherwise unremarkable vitals. Labs with sodium 129, potassium 5.1, chloride 99, bicarb 17, BUN of 100, serum creatinine of 4.61, GFR of 10, WBC 18.4, hemoglobin 6.9, platelets of 473.  LFT's were also elevated.  Patient met criteria for sepsis on arrival, treated per protocol in the ED with IV Cefepime, Flagyl, Vanc, and LR 1 L bolus fluids.  Infection source unclear but felt possibly due to sacral wound infection and concern for underlying osteomyelitis.  Admitted to the hospitalist service for further evaluation and management.  Continued on IV Cefepime and Vanc.   General surgery was consulted for debridement on sacral pressure injury.  Nephrology consulted due to worsening renal function.   Assessment and Plan: * Severe sepsis ([place]) POA as evidenced by tachycardia, leukocytosis, AKI and transaminitis consistent with organ dysfunction and severe sepsis with suspected infection source being sacral wounds versus an abdominal source (CT abdomen pelvis negative for anything acute however).  MRSA PCR negative. Wound cultures grew few Staph aureus, rare Enterococcus faecalis. Initially treated with vancomycin/cefepime. Vanc stopped 6/4.  --Transition to p.o. Keflex to complete course -- Continue IV fluids for now -- Follow blood and wound cultures -- Monitor for signs and symptoms of other infection -- Monitor wounds closely  Type 2  diabetes mellitus with chronic kidney disease, with long-term current use of insulin (HCC) Sliding scale NovoLog. Semglee 20 units twice daily. Adjust insulin for inpatient goal 140-180  DNR (do not resuscitate)/DNI(Do Not Intubate) Admitting hospitalist confirmed DNR CODE STATUS with patient during admission encounter  Essential hypertension Losartan held due to AKI. As needed hydralazine for now.  BPs overall stable with soft diastolics  Chronic kidney disease, stage 5 (HCC) See AKI.  Nephrology following. On IV Lasix and albumin per nephrology. Bilateral lower extremity edema is improving.  Sacral wound, initial encounter See pressure injury  AKI (acute kidney injury) ([place]) Superimposed on CKD stage V.  Suspect prerenal secondary to poor p.o. intake versus intrarenal secondary to sepsis.  Treated with 1 L bolus LR in the ED.  In chart review, appears discussions have been initiated regarding dialysis. Initially treated with fluids. -- Consult nephrology -- On Lasix drip - Daily BMP -- Avoid nephrotoxins, hypotension -- Renally dose meds  Abdominal pain Patient reported on admission.  CT of abdomen pelvis showed abnormal endometrial thickening for a postmenopausal patient, bladder distention estimated 520 mL, no other acute or inflammatory processes or acute findings. --Monitor clinically for now -- Needs OB/GYN follow-up for endometrial sampling to exclude endometrial carcinoma  Swelling of left lower extremity With redness, patient attributes it to clindamycin.  She reported on admission that this has been present for over one month.   Left lower extremity Doppler US on 12/18/21 was negative for DVT.  Monitor closely for now.  Hyponatremia Resolved.  Presented with sodium 129.  This was noted on labs at [facility] and was the reason patient presented to the ED. improved with IV hydration. --Continue IV  fluids -- Monitor BMP --Monitor volume status closely  Na: 136 >>  133   Symptomatic anemia Hemoglobin on admission was 6.9 >> 6.3, down from 7.6 at time of discharge on 5/23.  No signs of active bleeding, did have vaginal/endometrial bleeding previous admission but no reports of this recently.  May have slow chronic blood loss from sacral wounds.  Has baseline anemia of chronic disease due to renal failure. Received 1 unit PRBC transfusion on admission. -- Transfusing another unit RBCs today -- Trend hemoglobin and transfuse if less than 7  Hypomagnesemia Resolved with replacement on 6/3. Monitor and replace Mg as needed.   Premature atrial complexes Patient became significantly tachycardic on 6/2 with heart rates sustaining in the 130s.  Cardiology reviewed telemetry and feels patient dysrhythmias were not A-fib but sinus tach with PACs. --Cardiology following -- Continue Lopressor 25 mg BID -- Continue monitoring telemetry  Anasarca associated with disorder of kidney Nephrology following. On Lasix gtt at 6 mg/hr, IV albumin. Monitor I/O's & Daily Wts  Elevated LFTs On admission, alk phos 212, AST 182, ALT 107.  Unclear etiology, repeat LFTs not available today.  Appears LFTs were elevated at time of discharge on 5/23 but have increased since. RUQ ultrasound unremarkable. Acute hepatitis panel negative. -- Follow CMP  Pressure injury of skin Present on admission.  Patient has multiple sacral pressure injuries.  Some concern at time of admission that these were the source of sepsis. General surgery consulted and took patient to the OR on 6/1 for debridement.   Now with wound VAC. --Initial wound VAC change Monday 6/5 then every M/W/F -- Wound care consulted -- Frequent repositioning --Follow general surgery recommendations --Pain control --Follow blood cultures Pressure Injury 01/01/22 Buttocks Left Stage 3 -  Full thickness tissue loss. Subcutaneous fat may be visible but bone, tendon or muscle are NOT exposed. (Active)  01/01/22 0222   Location: Buttocks  Location Orientation: Left  Staging: Stage 3 -  Full thickness tissue loss. Subcutaneous fat may be visible but bone, tendon or muscle are NOT exposed.  Wound Description (Comments):   Present on Admission: Yes     Pressure Injury 01/01/22 Buttocks Right Stage 2 -  Partial thickness loss of dermis presenting as a shallow open injury with a red, pink wound bed without slough. (Active)  01/01/22 0223  Location: Buttocks  Location Orientation: Right  Staging: Stage 2 -  Partial thickness loss of dermis presenting as a shallow open injury with a red, pink wound bed without slough.  Wound Description (Comments):   Present on Admission: Yes     Pressure Injury 01/01/22 Hip Left Stage 2 -  Partial thickness loss of dermis presenting as a shallow open injury with a red, pink wound bed without slough. (Active)  01/01/22 0225  Location: Hip  Location Orientation: Left  Staging: Stage 2 -  Partial thickness loss of dermis presenting as a shallow open injury with a red, pink wound bed without slough.  Wound Description (Comments):   Present on Admission: Yes     Pressure Injury 01/01/22 Hip Right Stage 3 -  Full thickness tissue loss. Subcutaneous fat may be visible but bone, tendon or muscle are NOT exposed. (Active)  01/01/22 0225  Location: Hip  Location Orientation: Right  Staging: Stage 3 -  Full thickness tissue loss. Subcutaneous fat may be visible but bone, tendon or muscle are NOT exposed.  Wound Description (Comments):   Present on Admission: Yes  OSA (obstructive sleep apnea) .  Appears not to be on CPAP        Subjective: Patient very drowsy but stays awake for conversation on rounds.  Reports pain controlled.  Wound vac change earlier was painful, but overall tolerated it okay.  Reports very good urine output and feeling 'lighter' with diuresis.  No other acute complaints.   Physical Exam: Vitals:   01/05/22 0437 01/05/22 0943 01/05/22 1233  01/05/22 1558  BP: (!) 150/68 (!) 151/71 (!) 130/52 130/61  Pulse: 88 95 85 88  Resp:  17 16   Temp: 98.4 F (36.9 C) 98.1 F (36.7 C) 98 F (36.7 C) 98.3 F (36.8 C)  TempSrc: Oral     SpO2: 93% 94% 93% 93%  Weight:      Height:       General exam: awake but very drowsy, no acute distress, obese Respiratory system: Lungs clear bilaterally, normal respiratory effort on room air Cardiovascular system: RRR, improved lower extremity edema.   Central nervous system: A&O x3. no gross focal neurologic deficits, normal speech Extremities: Improving bilateral lower extremity edema, distal LLE erythema appears resolved Skin: dry, intact, no rashes seen on visualized skin Psychiatry: normal mood, congruent affect, judgement and insight appear normal   Data Reviewed:  Notable labs: Sodium 134, bicarb 20, glucose 193, creatinine 3.93, BUN 98, alk phos 286, albumin 3.0, AST 125, ALT 76, GFR 12, hemoglobin 8.4     Family Communication: None  Disposition: Status is: Inpatient Remains inpatient appropriate because: Severity of illness with anemia requiring transfusion, requiring IV therapies. Pending d/c clearance by consultants.   Planned Discharge Destination: Skilled nursing facility    Time spent: 35 minutes   Author: Ezekiel Slocumb, DO 01/05/2022 6:25 PM  For on call review [email or web address].

## 2022-01-05 NOTE — Progress Notes (Signed)
[facility]  ROUNDING NOTE   Subjective:   Theresa Warren is a 66 y.o. female with past medical history of hypertension, hyperlipidemia, diabetes, and chronic kidney disease stage V. Patient presents to ed for abnormal labs. She has been admitted for Hyponatremia [E87.1] AKI (acute kidney injury) ([place]) [N17.9] Symptomatic anemia [D64.9] Anemia, unspecified type [D64.9] Severe sepsis ([place]) [A41.9, R65.20]  Patient is known to our clinic from previous admissions. She is followed by Dr Arty Baumgartner in [place] at [facility]. She states she recently was seen in office and advised to come to hospital for abnormal sodium labs. Patient states she was at [facility] receiving rehab, able to ambulate with assistance. She denies weakness, loss of appetite, or nausea and vomiting. Reports pain from sacral wound. Denies shortness of breath. States nephrology was preparing for vein mapping to prepare for dialysis in the future. She continues to make urine.    Update:  Patient resting quietly Alert and oriented, family at bedside States she feels better today  Creatinine at baseline Furosemide drip  Objective:  Vital signs in last 24 hours:  Temp:  [97.9 F (36.6 C)-98.4 F (36.9 C)] 98 F (36.7 C) (06/05 1233) Pulse Rate:  [83-95] 85 (06/05 1233) Resp:  [16-20] 16 (06/05 1233) BP: (130-151)/(52-76) 130/52 (06/05 1233) SpO2:  [93 %-97 %] 93 % (06/05 1233)  Weight change:  Filed Weights   12/31/21 1824  Weight: 97 kg    Intake/Output: I/O last 3 completed shifts: In: 276.9 [I.V.:33.6; IV Piggyback:243.3] Out: 2100 [Urine:2000; Drains:100]   Intake/Output this shift:  Total I/O In: 240 [P.O.:240] Out: -   Physical Exam: General: NAD  Head: Normocephalic, atraumatic. Moist oral mucosal membranes  Eyes: Anicteric  Lungs:  Clear to auscultation, normal effort, room air  Heart: Regular rhythm, tachycardia  Abdomen:  Soft, nontender, obese   Extremities: 2+ peripheral edema.  Neurologic: Nonfocal, moving all four extremities  Skin: No acute rash  Access: None    Basic Metabolic Panel: Recent Labs  Lab 01/01/22 1333 01/01/22 1446 01/02/22 0703 01/03/22 0613 01/04/22 0618 01/05/22 0512  NA 133* 133* 136 135 133* 134*  K 4.5 4.6 4.7 4.6 4.4 4.8  CL 103 103 108 105 105 102  CO2 17*  --  17* 19* 18* 20*  GLUCOSE 168* 157* 100* 92 244* 193*  BUN 95* 88* 89* 94* 99* 98*  CREATININE 4.48* 4.80* 4.12* 3.88* 3.98* 3.93*  CALCIUM 8.1*  --  8.6* 8.6* 9.1 10.0  MG 2.0  --  1.7 1.6* 2.1 2.0     Liver Function Tests: Recent Labs  Lab 12/31/21 1826 01/01/22 0202 01/02/22 0703 01/03/22 0613 01/05/22 0512  AST 192* 182* 65* 184* 125*  ALT 116* 107* 67* 93* 76*  ALKPHOS 229* 212* 175* 304* 286*  BILITOT 0.5 0.7 0.9 0.6 0.5  PROT 7.0 6.3* 6.5 5.8* 6.8  ALBUMIN 2.3* 2.1* 2.0* 1.9* 3.0*    No results for input(s): LIPASE, AMYLASE in the last 168 hours. No results for input(s): AMMONIA in the last 168 hours.  CBC: Recent Labs  Lab 01/01/22 0752 01/01/22 1446 01/02/22 0703 01/03/22 0613 01/04/22 0618 01/05/22 0512  WBC 13.8*  --  16.4* 13.0* 11.5* 10.5  NEUTROABS 11.1*  --   --   --   --   --   HGB 6.6* 8.5* 8.5* 8.0* 7.9* 8.4*  HCT 20.5* 25.0* 25.9* 24.6* 23.9* 26.4*  MCV 94.5  --  92.2 92.5 91.2 92.3  PLT 335  --  405* 360 347 393     Cardiac Enzymes: No results for input(s): CKTOTAL, CKMB, CKMBINDEX, TROPONINI in the last 168 hours.  BNP: Invalid input(s): POCBNP  CBG: Recent Labs  Lab 01/04/22 1122 01/04/22 1559 01/04/22 2107 01/05/22 0742 01/05/22 1235  GLUCAP 192* 219* 187* 197* 253*     Microbiology: Results for orders placed or performed during the hospital encounter of 12/31/21  Blood culture (routine x 2)     Status: None (Preliminary result)   Collection Time: 12/31/21 11:35 PM   Specimen: BLOOD  Result Value Ref Range Status   Specimen Description BLOOD RIGHT ARM  Final    Special Requests   Final    BOTTLES DRAWN AEROBIC AND ANAEROBIC Blood Culture adequate volume   Culture   Final    NO GROWTH 4 DAYS Performed at [facility], [address] [place], [place] [address]    Report Status PENDING  Incomplete  Blood culture (routine x 2)     Status: None (Preliminary result)   Collection Time: 12/31/21 11:49 PM   Specimen: BLOOD  Result Value Ref Range Status   Specimen Description BLOOD RIGHT HAND  Final   Special Requests   Final    BOTTLES DRAWN AEROBIC AND ANAEROBIC Blood Culture adequate volume   Culture   Final    NO GROWTH 4 DAYS Performed at [facility], [address] [place], [place] [address]    Report Status PENDING  Incomplete  MRSA Next Gen by PCR, Nasal     Status: None   Collection Time: 01/01/22 10:17 AM   Specimen: Nasal Mucosa; Nasal Swab  Result Value Ref Range Status   MRSA by PCR Next Gen NOT DETECTED NOT DETECTED Final    Comment: (NOTE) The GeneXpert MRSA Assay (FDA approved for NASAL specimens only), is one component of a comprehensive MRSA colonization surveillance program. It is not intended to diagnose MRSA infection nor to guide or monitor treatment for MRSA infections. Test performance is not FDA approved in patients less than 77 years old. Performed at [facility], [place], [place] [address]   Aerobic/Anaerobic Culture w Gram Stain (surgical/deep wound)     Status: None (Preliminary result)   Collection Time: 01/01/22  3:37 PM   Specimen: PATH Soft tissue  Result Value Ref Range Status   Specimen Description TISSUE  Final   Special Requests BUTTOCKS WOUND  Final   Gram Stain   Final    NO SQUAMOUS EPITHELIAL CELLS SEEN FEW WBC SEEN RARE GRAM POSITIVE RODS FEW GRAM POSITIVE COCCI Performed at [facility], [place] [address], [place],  [address]    Culture   Final    FEW STAPHYLOCOCCUS AUREUS RARE ENTEROCOCCUS FAECALIS NO ANAEROBES ISOLATED; CULTURE IN  PROGRESS FOR 5 DAYS    Report Status PENDING  Incomplete   Organism ID, Bacteria STAPHYLOCOCCUS AUREUS  Final   Organism ID, Bacteria ENTEROCOCCUS FAECALIS  Final      Susceptibility   Enterococcus faecalis - MIC*    AMPICILLIN <=2 SENSITIVE Sensitive     VANCOMYCIN 1 SENSITIVE Sensitive     GENTAMICIN SYNERGY SENSITIVE Sensitive     * RARE ENTEROCOCCUS FAECALIS   Staphylococcus aureus - MIC*    CIPROFLOXACIN >=8 RESISTANT Resistant     ERYTHROMYCIN >=8 RESISTANT Resistant     GENTAMICIN <=0.5 SENSITIVE Sensitive     OXACILLIN <=0.25 SENSITIVE Sensitive     TETRACYCLINE <=1 SENSITIVE Sensitive  VANCOMYCIN 1 SENSITIVE Sensitive     TRIMETH/SULFA <=10 SENSITIVE Sensitive     CLINDAMYCIN >=8 RESISTANT Resistant     RIFAMPIN <=0.5 SENSITIVE Sensitive     Inducible Clindamycin NEGATIVE Sensitive     * FEW STAPHYLOCOCCUS AUREUS  Aerobic/Anaerobic Culture w Gram Stain (surgical/deep wound)     Status: None (Preliminary result)   Collection Time: 01/01/22  3:46 PM   Specimen: PATH Soft tissue  Result Value Ref Range Status   Specimen Description TISSUE  Final   Special Requests BUTTOCKS WOUND NO 2  Final   Gram Stain   Final    NO SQUAMOUS EPITHELIAL CELLS SEEN FEW WBC SEEN FEW GRAM POSITIVE COCCI Performed at [facility], [address], [place], [place] [address]    Culture   Final    MODERATE STAPHYLOCOCCUS AUREUS FEW STAPHYLOCOCCUS EPIDERMIDIS NO ANAEROBES ISOLATED; CULTURE IN PROGRESS FOR 5 DAYS    Report Status PENDING  Incomplete   Organism ID, Bacteria STAPHYLOCOCCUS AUREUS  Final   Organism ID, Bacteria STAPHYLOCOCCUS EPIDERMIDIS  Final      Susceptibility   Staphylococcus aureus - MIC*    CIPROFLOXACIN >=8 RESISTANT Resistant     ERYTHROMYCIN >=8 RESISTANT Resistant     GENTAMICIN <=0.5 SENSITIVE Sensitive     OXACILLIN <=0.25 SENSITIVE Sensitive     TETRACYCLINE <=1 SENSITIVE Sensitive     VANCOMYCIN 1 SENSITIVE Sensitive     TRIMETH/SULFA <=10  SENSITIVE Sensitive     CLINDAMYCIN >=8 RESISTANT Resistant     RIFAMPIN <=0.5 SENSITIVE Sensitive     Inducible Clindamycin NEGATIVE Sensitive     * MODERATE STAPHYLOCOCCUS AUREUS   Staphylococcus epidermidis - MIC*    CIPROFLOXACIN 4 RESISTANT Resistant     ERYTHROMYCIN >=8 RESISTANT Resistant     GENTAMICIN <=0.5 SENSITIVE Sensitive     OXACILLIN >=4 RESISTANT Resistant     TETRACYCLINE 2 SENSITIVE Sensitive     VANCOMYCIN 1 SENSITIVE Sensitive     TRIMETH/SULFA <=10 SENSITIVE Sensitive     CLINDAMYCIN >=8 RESISTANT Resistant     RIFAMPIN <=0.5 SENSITIVE Sensitive     Inducible Clindamycin NEGATIVE Sensitive     * FEW STAPHYLOCOCCUS EPIDERMIDIS    Coagulation Studies: No results for input(s): LABPROT, INR in the last 72 hours.   Urinalysis: No results for input(s): COLORURINE, LABSPEC, PHURINE, GLUCOSEU, HGBUR, BILIRUBINUR, KETONESUR, PROTEINUR, UROBILINOGEN, NITRITE, LEUKOCYTESUR in the last 72 hours.  Invalid input(s): APPERANCEUR     Imaging: No results found.   Medications:    albumin human 25 g (01/05/22 0915)   furosemide (LASIX) 200 mg in dextrose 5% 100 mL (2mg /mL) infusion 6 mg/hr (01/04/22 1710)    vitamin C  500 mg Oral BID   atorvastatin  40 mg Oral Daily   cephALEXin  250 mg Oral Q8H   fluticasone  2 spray Each Nare Daily   gabapentin  100 mg Oral TID   insulin aspart  0-5 Units Subcutaneous QHS   insulin aspart  0-9 Units Subcutaneous TID WC   insulin glargine-yfgn  20 Units Subcutaneous BID   loratadine  10 mg Oral Daily   metoprolol succinate  50 mg Oral BID   multivitamin with minerals  1 tablet Oral Daily   nutrition supplement (JUVEN)  1 packet Oral BID BM   Ensure Max Protein  11 oz Oral QHS   sodium bicarbonate  1,300 mg Oral BID   zinc sulfate  220 mg Oral Daily   hydrALAZINE, morphine injection, ondansetron **OR**  ondansetron (ZOFRAN) IV, oxyCODONE  Assessment/ Plan:  Ms. Hazell Siwik is a 66 y.o.  female with past medical  history of hypertension, hyperlipidemia, diabetes, and chronic kidney disease stage V. Patient presents to ed for abnormal labs. She has been admitted for Hyponatremia [E87.1] AKI (acute kidney injury) ([place]) [N17.9] Symptomatic anemia [D64.9] Anemia, unspecified type [D64.9] Severe sepsis ([place]) [A41.9, R65.20]   Acute Kidney Injury with hyponatremia on chronic kidney disease stage V with baseline creatinine 3.91 and GFR of 12 on 12/23/21.  Acute kidney injury secondary to underlying illness Chronic kidney disease is secondary to diabetes and hypertension No exposure to IV contrast.  CT abdomen pelvis negative for obstruction, several renal stones identified.  Losartan and furosemide  held since admission.    Renal function has returned to baseline. Urine output adequate and generalized edema greatly improved. Will maintain Furosemide drip for an additional day and transition back to oral diuretics tomorrow.   Lab Results  Component Value Date   CREATININE 3.93 (H) 01/05/2022   CREATININE 3.98 (H) 01/04/2022   CREATININE 3.88 (H) 01/03/2022    Intake/Output Summary (Last 24 hours) at 01/05/2022 1314 Last data filed at 01/05/2022 1105 Gross per 24 hour  Intake 516.91 ml  Output 400 ml  Net 116.91 ml    2. Anemia of chronic kidney disease Lab Results  Component Value Date   HGB 8.4 (L) 01/05/2022  Hemoglobin improved to 8.4.  Consider Epogen as outpatient.  3. Secondary Hyperparathyroidism:  Lab Results  Component Value Date   CALCIUM 10.0 01/05/2022   CAION 1.11 (L) 01/01/2022  Will continue to monitor bone minerals during this admission.  4.  Acute metabolic acidosis.  Serum bicarbonate slowly improving to 20. Continue sodium bicarbonate supplementation.   5.  Generalized edema.  Greatly reduced with Furosemide drip. Will transition to oral diuretics tomorrow.    LOS: [place] 6/5/20231:14 PM

## 2022-01-06 DIAGNOSIS — R6520 Severe sepsis without septic shock: Secondary | ICD-10-CM | POA: Diagnosis not present

## 2022-01-06 DIAGNOSIS — A419 Sepsis, unspecified organism: Secondary | ICD-10-CM | POA: Diagnosis not present

## 2022-01-06 LAB — CBC
HCT: 25.7 % — ABNORMAL LOW (ref 36.0–46.0)
Hemoglobin: 8.3 g/dL — ABNORMAL LOW (ref 12.0–15.0)
MCH: 30 pg (ref 26.0–34.0)
MCHC: 32.3 g/dL (ref 30.0–36.0)
MCV: 92.8 fL (ref 80.0–100.0)
Platelets: 395 10*3/uL (ref 150–400)
RBC: 2.77 MIL/uL — ABNORMAL LOW (ref 3.87–5.11)
RDW: 14.9 % (ref 11.5–15.5)
WBC: 12.2 10*3/uL — ABNORMAL HIGH (ref 4.0–10.5)
nRBC: 0 % (ref 0.0–0.2)

## 2022-01-06 LAB — COMPREHENSIVE METABOLIC PANEL
ALT: 75 U/L — ABNORMAL HIGH (ref 0–44)
AST: 128 U/L — ABNORMAL HIGH (ref 15–41)
Albumin: 3.3 g/dL — ABNORMAL LOW (ref 3.5–5.0)
Alkaline Phosphatase: 257 U/L — ABNORMAL HIGH (ref 38–126)
Anion gap: 12 (ref 5–15)
BUN: 121 mg/dL — ABNORMAL HIGH (ref 8–23)
CO2: 23 mmol/L (ref 22–32)
Calcium: 10.3 mg/dL (ref 8.9–10.3)
Chloride: 99 mmol/L (ref 98–111)
Creatinine, Ser: 4.18 mg/dL — ABNORMAL HIGH (ref 0.44–1.00)
GFR, Estimated: 11 mL/min — ABNORMAL LOW (ref >60)
Glucose, Bld: 214 mg/dL — ABNORMAL HIGH (ref 70–99)
Potassium: 4.7 mmol/L (ref 3.5–5.1)
Sodium: 134 mmol/L — ABNORMAL LOW (ref 135–145)
Total Bilirubin: 0.6 mg/dL (ref 0.3–1.2)
Total Protein: 7.4 g/dL (ref 6.5–8.1)

## 2022-01-06 LAB — BLOOD GAS, VENOUS
Acid-Base Excess: 0 mmol/L (ref 0.0–2.0)
Bicarbonate: 24.8 mmol/L (ref 20.0–28.0)
O2 Saturation: 61.3 %
Patient temperature: 37
pCO2, Ven: 40 mmHg — ABNORMAL LOW (ref 44–60)
pH, Ven: 7.4 (ref 7.25–7.43)
pO2, Ven: 34 mmHg (ref 32–45)

## 2022-01-06 LAB — CULTURE, BLOOD (ROUTINE X 2)
Culture: NO GROWTH
Culture: NO GROWTH
Special Requests: ADEQUATE
Special Requests: ADEQUATE

## 2022-01-06 LAB — AEROBIC/ANAEROBIC CULTURE W GRAM STAIN (SURGICAL/DEEP WOUND)
Gram Stain: NONE SEEN
Gram Stain: NONE SEEN

## 2022-01-06 LAB — GLUCOSE, CAPILLARY
Glucose-Capillary: 200 mg/dL — ABNORMAL HIGH (ref 70–99)
Glucose-Capillary: 203 mg/dL — ABNORMAL HIGH (ref 70–99)
Glucose-Capillary: 269 mg/dL — ABNORMAL HIGH (ref 70–99)
Glucose-Capillary: 303 mg/dL — ABNORMAL HIGH (ref 70–99)
Glucose-Capillary: 251 mg/dL — ABNORMAL HIGH (ref 70–99)

## 2022-01-06 MED ORDER — INSULIN GLARGINE-YFGN 100 UNIT/ML ~~LOC~~ SOLN
22.0000 [IU] | Freq: Two times a day (BID) | SUBCUTANEOUS | Status: DC
  Administered 2022-01-06: 22 [IU] via SUBCUTANEOUS
  Filled 2022-01-06 (×2): qty 0.22

## 2022-01-06 MED ORDER — FUROSEMIDE 40 MG PO TABS
40.0000 mg | ORAL_TABLET | Freq: Two times a day (BID) | ORAL | Status: DC
  Administered 2022-01-06 – 2022-01-10 (×9): 40 mg via ORAL
  Filled 2022-01-06 (×9): qty 1

## 2022-01-06 MED ORDER — INSULIN ASPART 100 UNIT/ML IJ SOLN
3.0000 [IU] | Freq: Three times a day (TID) | INTRAMUSCULAR | Status: DC
  Administered 2022-01-06 (×2): 3 [IU] via SUBCUTANEOUS
  Filled 2022-01-06 (×2): qty 1

## 2022-01-06 NOTE — Progress Notes (Addendum)
Physical Therapy Treatment Patient Details Name: Theresa Warren MRN: [id number] DOB: 10/03/1955 Today's Date: 01/06/2022   History of Present Illness [name] is a 66 year old female admitted for evaluation of low sodium noted on labs at the facility.  Pt was discharged  to rehab on 5/23 after being admitted following a fall. PMH significant for acute on chronic anemia. hyperlipidemia, hypertension, CKD stage V, insulin-dependent diabetes mellitus    PT Comments    Pt received upright in bed agreeable to PT/OT co-treat for pt safety. Pt improving with functional mobility with ability to transfer from supine to sitting EOB but reliant on bed features and HOB elevated. CGA+2 to stand to RW with bed lowered to stand step transfer to [facility] for voiding of  B/B. Unable to void bowels but voiding bladder. Pt standing to RW with CGA+1 and able to initiate perihygiene but ultimately reliant on OT for completion of task. Overall pt able to ambulate with supervision +1 with safe use of RW. Second need for seated void of bladder needed with same level of assist from OT as before. Pt returning to recliner with all needs in reach with education provided from OT on pressure relief techniques. Although pt progressing in mobility, pt still requiring heavy UE support on RW and seated rest breaks and increased WoB throughout post ambulation with HR up to 130's with short distances. D/c recs remain appropriate at this time.    Recommendations for follow up therapy are one component of a multi-disciplinary discharge planning process, led by the attending physician.  Recommendations may be updated based on patient status, additional functional criteria and insurance authorization.  Follow Up Recommendations  Skilled nursing-short term rehab (<3 hours/day)     Assistance Recommended at Discharge Frequent or constant Supervision/Assistance  Patient can return home with the following A little help with  walking and/or transfers;A lot of help with bathing/dressing/bathroom;Assistance with cooking/housework;Help with stairs or ramp for entrance;Assist for transportation   Equipment Recommendations  Rolling walker (2 wheels);BSC/3in1    Recommendations for Other Services       Precautions / Restrictions Precautions Precautions: Fall Restrictions Weight Bearing Restrictions: No     Mobility  Bed Mobility Overal bed mobility: Needs Assistance Bed Mobility: Supine to Sit     Supine to sit: HOB elevated, Supervision     General bed mobility comments: Increased time but no physical assist Patient Response: Cooperative  Transfers Overall transfer level: Needs assistance Equipment used: Rolling walker (2 wheels) Transfers: Sit to/from Stand Sit to Stand: Min guard           General transfer comment: Progressing to minguard 1 person assist to stand to RW    Ambulation/Gait Ambulation/Gait assistance: Supervision Gait Distance (Feet): 30 Feet Assistive device: Rolling walker (2 wheels) Gait Pattern/deviations: Step-through pattern, Decreased step length - right, Decreased step length - left, Decreased stride length, Trunk flexed       General Gait Details: Heavy reliance on RW for support due to LE weakness   Stairs             Wheelchair Mobility    Modified Rankin (Stroke Patients Only)       Balance Overall balance assessment: Needs assistance Sitting-balance support: No upper extremity supported, Feet supported Sitting balance-Leahy Scale: Good     Standing balance support: Bilateral upper extremity supported Standing balance-Leahy Scale: Fair  Cognition Arousal/Alertness: Awake/alert Behavior During Therapy: WFL for tasks assessed/performed Overall Cognitive Status: Within Functional Limits for tasks assessed                                          Exercises      General Comments         Pertinent Vitals/Pain Pain Assessment Pain Assessment: Faces Faces Pain Scale: Hurts little more Pain Location: buttocks Pain Descriptors / Indicators: Discomfort, Grimacing Pain Intervention(s): Limited activity within patient's tolerance, Monitored during session, Repositioned    Home Living                          Prior Function            PT Goals (current goals can now be found in the care plan section) Acute Rehab PT Goals Patient Stated Goal: to go home PT Goal Formulation: With patient Time For Goal Achievement: 01/17/22 Potential to Achieve Goals: Good Progress towards PT goals: Progressing toward goals    Frequency    Min 2X/week      PT Plan Current plan remains appropriate    Co-evaluation PT/OT/SLP Co-Evaluation/Treatment: Yes Reason for Co-Treatment: For patient/therapist safety;To address functional/ADL transfers PT goals addressed during session: Mobility/safety with mobility;Balance;Proper use of DME;Strengthening/ROM OT goals addressed during session: ADL's and self-care      AM-PAC PT "6 Clicks" Mobility   Outcome Measure  Help needed turning from your back to your side while in a flat bed without using bedrails?: A Lot Help needed moving from lying on your back to sitting on the side of a flat bed without using bedrails?: A Lot Help needed moving to and from a bed to a chair (including a wheelchair)?: A Little Help needed standing up from a chair using your arms (e.g., wheelchair or bedside chair)?: A Little Help needed to walk in hospital room?: A Little Help needed climbing 3-5 steps with a railing? : A Lot 6 Click Score: 15    End of Session Equipment Utilized During Treatment: Gait belt Activity Tolerance: Patient tolerated treatment well Patient left: in chair;with call bell/phone within reach;with chair alarm set Nurse Communication: Mobility status PT Visit Diagnosis: Unsteadiness on feet (R26.81);Muscle weakness  (generalized) (M62.81);Difficulty in walking, not elsewhere classified (R26.2)     Time: 4098-1191 PT Time Calculation (min) (ACUTE ONLY): 41 min  Charges:  $Therapeutic Exercise: 23-37 mins                     Adream Parzych M. Fairly IV, PT, DPT Physical Therapist- [facility]  01/06/2022, 2:48 PM

## 2022-01-06 NOTE — Progress Notes (Signed)
Occupational Therapy Treatment Patient Details Name: Theresa Warren MRN: [id number] DOB: 1956/05/20 Today's Date: 01/06/2022   History of present illness [name] is a 66 year old female admitted for evaluation of low sodium noted on labs at the facility.  Pt was discharged  to rehab on 5/23 after being admitted following a fall. PMH significant for acute on chronic anemia. hyperlipidemia, hypertension, CKD stage V, insulin-dependent diabetes mellitus   OT comments  Chart reviewed, pt greeted in bed agreeable to OT tx session. Co tx completed with PT on this date. Tx session targeted improving activity tolerance and improving indep during functional mobility and ADL task completion. Improvements noted in bed mobility, performing with supervision, STS from various surface heights with CGA with RW, amb in room with CGA. Pt performed toileting with MOD A for thoroughness. Pt live alone, continue to perform ADL below PLOF, has 4 STE. Significant education provide re: pressure relieving techniques to prevent further skin break down. All questions answered. Discharge recommendation remains appropriate.    Recommendations for follow up therapy are one component of a multi-disciplinary discharge planning process, led by the attending physician.  Recommendations may be updated based on patient status, additional functional criteria and insurance authorization.    Follow Up Recommendations  Skilled nursing-short term rehab (<3 hours/day)    Assistance Recommended at Discharge Frequent or constant Supervision/Assistance  Patient can return home with the following  A lot of help with bathing/dressing/bathroom;A little help with walking and/or transfers   Equipment Recommendations  BSC/3in1    Recommendations for Other Services      Precautions / Restrictions Precautions Precautions: Fall Restrictions Weight Bearing Restrictions: No       Mobility Bed Mobility Overal bed  mobility: Needs Assistance Bed Mobility: Supine to Sit     Supine to sit: HOB elevated, Supervision          Transfers Overall transfer level: Needs assistance Equipment used: Rolling walker (2 wheels) Transfers: Sit to/from Stand Sit to Stand: Min guard                 Balance Overall balance assessment: Needs assistance Sitting-balance support: No upper extremity supported, Feet supported Sitting balance-Leahy Scale: Good     Standing balance support: Bilateral upper extremity supported Standing balance-Leahy Scale: Fair                             ADL either performed or assessed with clinical judgement   ADL Overall ADL's : Needs assistance/impaired Eating/Feeding: Sitting;Set up               Upper Body Dressing : Minimal assistance;Sitting   Lower Body Dressing: Maximal assistance Lower Body Dressing Details (indicate cue type and reason): socks Toilet Transfer: Ambulation;+2 for safety/equipment;Rolling walker (2 wheels);Min guard   Toileting- Clothing Manipulation and Hygiene: Moderate assistance;Sit to/from stand Toileting - Clothing Manipulation Details (indicate cue type and reason): with RW for thoroughness     Functional mobility during ADLs: Min guard      Extremity/Trunk Assessment              Vision       Perception     Praxis      Cognition Arousal/Alertness: Awake/alert Behavior During Therapy: WFL for tasks assessed/performed Overall Cognitive Status: Within Functional Limits for tasks assessed Area of Impairment: Safety/judgement, Problem solving  Safety/Judgement: Decreased awareness of deficits   Problem Solving: Requires verbal cues General Comments: alert and oriented x4, decreased awareness of current level of function        Exercises Other Exercises Other Exercises: educated re: importance of pressure relief/techniques for in chair, importance of oob  mobility    Shoulder Instructions       General Comments +wound vack on buttocks, open areas around sacrum    Pertinent Vitals/ Pain       Pain Assessment Pain Assessment: Faces Faces Pain Scale: Hurts little more Pain Location: sacrum Pain Descriptors / Indicators: Discomfort, Grimacing Pain Intervention(s): Limited activity within patient's tolerance, Monitored during session, Repositioned  Home Living                                          Prior Functioning/Environment              Frequency  Min 2X/week        Progress Toward Goals  OT Goals(current goals can now be found in the care plan section)  Progress towards OT goals: Progressing toward goals     Plan Discharge plan remains appropriate    Co-evaluation    PT/OT/SLP Co-Evaluation/Treatment: Yes Reason for Co-Treatment: For patient/therapist safety;To address functional/ADL transfers PT goals addressed during session: Mobility/safety with mobility;Balance;Proper use of DME;Strengthening/ROM OT goals addressed during session: ADL's and self-care      AM-PAC OT "6 Clicks" Daily Activity     Outcome Measure   Help from another person eating meals?: None Help from another person taking care of personal grooming?: A Little Help from another person toileting, which includes using toliet, bedpan, or urinal?: A Little Help from another person bathing (including washing, rinsing, drying)?: A Lot Help from another person to put on and taking off regular upper body clothing?: A Little Help from another person to put on and taking off regular lower body clothing?: A Lot 6 Click Score: 17    End of Session Equipment Utilized During Treatment: Rolling walker (2 wheels)  OT Visit Diagnosis: Other abnormalities of gait and mobility (R26.89);Muscle weakness (generalized) (M62.81)   Activity Tolerance Patient tolerated treatment well   Patient Left in chair;with call bell/phone within  reach;with chair alarm set   Nurse Communication Mobility status        Time: 0998-3382 OT Time Calculation (min): 41 min  Charges: OT General Charges $OT Visit: 1 Visit OT Treatments $Self Care/Home Management : 8-22 mins  Shanon Payor, OTD OTR/L  01/06/22, 4:11 PM

## 2022-01-06 NOTE — Progress Notes (Signed)
Progress Note   Patient: Theresa Warren TDV:[id number] DOB: January 12, 1956 DOA: 12/31/2021     5 DOS: the patient was seen and examined on 01/06/2022   Brief hospital course: Theresa Warren is a 66 year old female hyperlipidemia, hypertension, CKD stage V, insulin-dependent diabetes mellitus, who presents emergency department from [facility] for evaluation of low sodium noted on labs at the facility.  She was discharged to rehab on 5/23 after being admitted following a fall and also with acute on chronic anemia.  ED Course - temp 99.6, HR 107, otherwise unremarkable vitals. Labs with sodium 129, potassium 5.1, chloride 99, bicarb 17, BUN of 100, serum creatinine of 4.61, GFR of 10, WBC 18.4, hemoglobin 6.9, platelets of 473.  LFT's were also elevated.  Patient met criteria for sepsis on arrival, treated per protocol in the ED with IV Cefepime, Flagyl, Vanc, and LR 1 L bolus fluids.  Infection source unclear but felt possibly due to sacral wound infection and concern for underlying osteomyelitis.  Admitted to the hospitalist service for further evaluation and management.  Continued on IV Cefepime and Vanc.   General surgery was consulted for debridement on sacral pressure injury.  Nephrology consulted due to worsening renal function.   Assessment and Plan: * Severe sepsis ([place]) POA as evidenced by tachycardia, leukocytosis, AKI and transaminitis consistent with organ dysfunction and severe sepsis with suspected infection source being sacral wounds versus an abdominal source (CT abdomen pelvis negative for anything acute however).  MRSA PCR negative. Wound cultures grew few Staph aureus, rare Enterococcus faecalis. Initially treated with vancomycin/cefepime. Vanc stopped 6/4.  --Transition to p.o. Keflex to complete course -- Continue IV fluids for now -- Follow blood and wound cultures -- Monitor for signs and symptoms of other infection -- Monitor wounds closely  Type 2  diabetes mellitus with chronic kidney disease, with long-term current use of insulin (HCC) Sliding scale NovoLog. Semglee 20 units twice daily. Adjust insulin for inpatient goal 140-180  DNR (do not resuscitate)/DNI(Do Not Intubate) Admitting hospitalist confirmed DNR CODE STATUS with patient during admission encounter  Essential hypertension Losartan held due to AKI. As needed hydralazine for now.  BPs overall stable with soft diastolics  Chronic kidney disease, stage 5 (HCC) See AKI.  Nephrology following. Treated IV Lasix and albumin per nephrology.  Bilateral lower extremity edema is improving.  Sacral wound, initial encounter See pressure injury  AKI (acute kidney injury) ([place]) Superimposed on CKD stage V.  Suspect prerenal secondary to poor p.o. intake versus intrarenal secondary to sepsis.  Treated with 1 L bolus LR in the ED.  In chart review, appears discussions have been initiated regarding dialysis. Initially treated with fluids, later diuresis by nephrology.  Was on Lasix drip, stopped this AM with rising Cr. -- Consult nephrology -- Resumed on oral Lasix today - Daily BMP -- Avoid nephrotoxins, hypotension -- Renally dose meds -- Anticipated to need dialysis in the near future  Abdominal pain Patient reported on admission.  CT of abdomen pelvis showed abnormal endometrial thickening for a postmenopausal patient, bladder distention estimated 520 mL, no other acute or inflammatory processes or acute findings. --Monitor clinically for now -- Needs OB/GYN follow-up for endometrial sampling to exclude endometrial carcinoma  Swelling of left lower extremity With redness, patient attributes it to clindamycin.  She reported on admission that this has been present for over one month.   Left lower extremity Doppler US on 12/18/21 was negative for DVT.  Monitor closely for now.  Hyponatremia Resolved.  Presented with sodium 129.  This was noted on labs at [facility] and was the  reason patient presented to the ED. improved with IV hydration. --Continue IV fluids -- Monitor BMP --Monitor volume status closely  Na: 136 >> 133   Symptomatic anemia Hemoglobin on admission was 6.9 >> 6.3, down from 7.6 at time of discharge on 5/23.  No signs of active bleeding, did have vaginal/endometrial bleeding previous admission but no reports of this recently.  May have slow chronic blood loss from sacral wounds.  Has baseline anemia of chronic disease due to renal failure. Received 1 unit PRBC transfusion on admission. -- Transfusing another unit RBCs today -- Trend hemoglobin and transfuse if less than 7  Hypomagnesemia Resolved with replacement on 6/3. Monitor and replace Mg as needed.   Premature atrial complexes Patient became significantly tachycardic on 6/2 with heart rates sustaining in the 130s.  Cardiology reviewed telemetry and feels patient dysrhythmias were not A-fib but sinus tach with PACs. --Cardiology following -- Continue Lopressor 25 mg BID -- Continue monitoring telemetry  Anasarca associated with disorder of kidney Nephrology following. Treated with Lasix IV, later Lasix drip, IV albumin.  Nephrology switched back to PO Lasix today.  Monitor I/O's & Daily Wts  Elevated LFTs On admission, alk phos 212, AST 182, ALT 107.  Unclear etiology, repeat LFTs not available today.  Appears LFTs were elevated at time of discharge on 5/23 but have increased since. RUQ ultrasound unremarkable. Acute hepatitis panel negative. -- Follow CMP  Pressure injury of skin Present on admission.  Patient has multiple sacral pressure injuries.  Some concern at time of admission that these were the source of sepsis. General surgery consulted and took patient to the OR on 6/1 for debridement.   Now with wound VAC. --Initial wound VAC change Monday 6/5 then every M/W/F -- Wound care consulted -- Frequent repositioning --Follow general surgery recommendations --Pain  control --Follow blood cultures Pressure Injury 01/01/22 Buttocks Left Stage 3 -  Full thickness tissue loss. Subcutaneous fat may be visible but bone, tendon or muscle are NOT exposed. (Active)  01/01/22 0222  Location: Buttocks  Location Orientation: Left  Staging: Stage 3 -  Full thickness tissue loss. Subcutaneous fat may be visible but bone, tendon or muscle are NOT exposed.  Wound Description (Comments):   Present on Admission: Yes     Pressure Injury 01/01/22 Buttocks Right Stage 2 -  Partial thickness loss of dermis presenting as a shallow open injury with a red, pink wound bed without slough. (Active)  01/01/22 0223  Location: Buttocks  Location Orientation: Right  Staging: Stage 2 -  Partial thickness loss of dermis presenting as a shallow open injury with a red, pink wound bed without slough.  Wound Description (Comments):   Present on Admission: Yes     Pressure Injury 01/01/22 Hip Left Stage 2 -  Partial thickness loss of dermis presenting as a shallow open injury with a red, pink wound bed without slough. (Active)  01/01/22 0225  Location: Hip  Location Orientation: Left  Staging: Stage 2 -  Partial thickness loss of dermis presenting as a shallow open injury with a red, pink wound bed without slough.  Wound Description (Comments):   Present on Admission: Yes     Pressure Injury 01/01/22 Hip Right Stage 3 -  Full thickness tissue loss. Subcutaneous fat may be visible but bone, tendon or muscle are NOT exposed. (Active)  01/01/22 0225  Location: Hip  Location Orientation: Right  Staging: Stage 3 -  Full thickness tissue loss. Subcutaneous fat may be visible but bone, tendon or muscle are NOT exposed.  Wound Description (Comments):   Present on Admission: Yes      OSA (obstructive sleep apnea) .  Appears not to be on CPAP        Subjective: Patient awake but drowsy when seen on rounds this morning.  She does stay awake and is fairly talkative however.  She  reports pain is okay as long as she is resting and still.  Has quite a bit of pain with any movement.  No fevers or chills.  Nephrology stopped the Lasix drip this morning  Physical Exam: Vitals:   01/06/22 0447 01/06/22 0500 01/06/22 0808 01/06/22 1110  BP: (!) 154/63  (!) 128/57 (!) 137/54  Pulse: 93  93 88  Resp: _0 Temp: 98.9 F (37.2 C)  97.6 F (36.4 C) 97.8 F (36.6 C)  TempSrc:   Oral Oral  SpO2: 93%  91% 92%  Weight:  104.3 kg    Height:       General exam: Awake, drowsy appearing, no acute distress, obese Respiratory system: CTA B, no wheezes or rhonchi, normal respiratory effort, room air Cardiovascular system: RRR, further improved lower extremity edema.   Central nervous system: A&O x3. no gross focal neurologic deficits, normal speech Extremities: bilateral lower extremity edema further improved, distal LLE erythema appears nearly resolved Skin: dry, intact, distal left lower extremity erythema improving  Psychiatry: normal mood, congruent affect, judgement and insight appear normal   Data Reviewed:  Notable labs: Sodium 134, glucose 214, BUN 121, creatinine 4.18, alk phos 257, Albumin 3.3, AST 128, ALT 75, WBC 12.2, hemoglobin 8.3     Family Communication: None  Disposition: Status is: Inpatient Remains inpatient appropriate because: Severity of illness with anemia requiring transfusion, requiring IV therapies. Pending d/c clearance by consultants.   Planned Discharge Destination: Skilled nursing facility    Time spent: 35 minutes   Author: Ezekiel Slocumb, DO 01/06/2022 4:36 PM  For on call review [email or web address].

## 2022-01-06 NOTE — Progress Notes (Signed)
Nutrition Follow-up  DOCUMENTATION CODES:   Morbid obesity  INTERVENTION:   -Continue Ensure Max po daily, each supplement provides 150 kcal and 30 grams of protein.   -Continue 1 packet Juven BID, each packet provides 95 calories, 2.5 grams of protein (collagen), and 9.8 grams of carbohydrate (3 grams sugar); also contains 7 grams of L-arginine and L-glutamine, 300 mg vitamin C, 15 mg vitamin E, 1.2 mcg vitamin B-12, 9.5 mg zinc, 200 mg calcium, and 1.5 g  Calcium Beta-hydroxy-Beta-methylbutyrate to support wound healing  -Continue MVI with minerals daily -Continue 500 mg vitamin C BID -Continue 220 mg zinc sulfate daily x 14 days   NUTRITION DIAGNOSIS:   Increased nutrient needs related to wound healing as evidenced by estimated needs.  Ongoing  GOAL:   Patient will meet greater than or equal to 90% of their needs  Progressing   MONITOR:   PO intake, Supplement acceptance, Diet advancement  REASON FOR ASSESSMENT:   Malnutrition Screening Tool    ASSESSMENT:   Pt with PMH of hyperlipidemia, hypertension, insulin-dependent diabetes mellitus, who presents from [facility] for chief concerns of abnormal serum sodium levels on labs.  5/31- s/p Debridement of three decubitus wounds of bilateral buttocks, total debridement area of 133 cm2;  Placement of negative pressure dressing > 50 cm.  Reviewed I/O's: +130 ml x 24 hours and -2 L since admission  UOP: 350 ml x 24 hours   Spoke with pt at bedside, who reports feeling better today. Her appetite has improved; she estimates she consumes at least 50% of meals. Noted meal completions 50-100%.   Pt shares that she is drinking Juven supplements, but has not tried the Ensure Max yet. RD reviewed nutrition plan of care and interventions with pt as well as rationale for why each item was ordered. Reviewed other supplements on formulary, but pt prefers to continue with ordered supplements.   Pt shares she always has erratic  bowel movements. She admits her last BM was on 01/01/22. Offered bowel regimen, however, pt refusing at this time, stating she feels she will have one soon as she is passing gas.   Medications reviewed and include lasix.   Labs reviewed: Na: 134, CBGS: 200-269 (inpatient orders for glycemic control are 0-5 units insulin aspart daily at bedtime, 0-9 units insulin aspart TID with meals,  and 22 units insulin glargine-yfgn daily).    Diet Order:   Diet Order             Diet heart healthy/carb modified Room service appropriate? Yes; Fluid consistency: Thin  Diet effective now                   EDUCATION NEEDS:   Not appropriate for education at this time  Skin:  Skin Assessment: Skin Integrity Issues: Skin Integrity Issues:: Stage III, Stage II Stage II: rt buttocks, bilateral buttocks, lt hips Stage III: lt buttocks, rt hip  Last BM:  01/01/22  Height:   Ht Readings from Last 1 Encounters:  12/31/21 5\' 3"  (1.6 m)    Weight:   Wt Readings from Last 1 Encounters:  01/06/22 104.3 kg    Ideal Body Weight:  52.3 kg  BMI:  Body mass index is 40.73 kg/m.  Estimated Nutritional Needs:   Kcal:  1650-1850  Protein:  90-105 grams  Fluid:  > 1.6 L    Loistine Chance, RD, LDN, [place] Registered Dietitian II Certified Diabetes Care and Education Specialist Please refer to [facility] [place] for RD and/or  RD on-call/weekend/after hours pager

## 2022-01-06 NOTE — Progress Notes (Signed)
[facility]  ROUNDING NOTE   Subjective:   Theresa Warren is a 66 y.o. female with past medical history of hypertension, hyperlipidemia, diabetes, and chronic kidney disease stage V. Patient presents to ed for abnormal labs. She has been admitted for Hyponatremia [E87.1] AKI (acute kidney injury) ([place]) [N17.9] Symptomatic anemia [D64.9] Anemia, unspecified type [D64.9] Severe sepsis ([place]) [A41.9, R65.20]  Patient is known to our clinic from previous admissions. She is followed by Dr Arty Baumgartner in [place] at [facility]. She states she recently was seen in office and advised to come to hospital for abnormal sodium labs. Patient states she was at [facility] receiving rehab, able to ambulate with assistance. She denies weakness, loss of appetite, or nausea and vomiting. Reports pain from sacral wound. Denies shortness of breath. States nephrology was preparing for vein mapping to prepare for dialysis in the future. She continues to make urine.    Update:  Patient resting quietly States she feels well today Appetite improving, denies nausea  Creatinine 4.18   Objective:  Vital signs in last 24 hours:  Temp:  [97.6 F (36.4 C)-98.9 F (37.2 C)] 97.6 F (36.4 C) (06/06 0808) Pulse Rate:  [85-93] 93 (06/06 0808) Resp:  [15-20] 16 (06/06 0808) BP: (128-154)/(52-71) 128/57 (06/06 0808) SpO2:  [91 %-93 %] 91 % (06/06 0808) Weight:  [104.3 kg] 104.3 kg (06/06 0500)  Weight change:  Filed Weights   12/31/21 1824 01/06/22 0500  Weight: 97 kg 104.3 kg    Intake/Output: I/O last 3 completed shifts: In: 511.5 [P.O.:480; I.V.:31.5] Out: 750 [Urine:750]   Intake/Output this shift:  Total I/O In: 240 [P.O.:240] Out: -   Physical Exam: General: NAD  Head: Normocephalic, atraumatic. Moist oral mucosal membranes  Eyes: Anicteric  Lungs:  Clear to auscultation, normal effort, room air  Heart: Regular rhythm, tachycardia  Abdomen:  Soft, nontender,  obese  Extremities: 1+ peripheral edema.  Neurologic: Nonfocal, moving all four extremities  Skin: No acute rash  Access: None    Basic Metabolic Panel: Recent Labs  Lab 01/01/22 1333 01/01/22 1446 01/02/22 0703 01/03/22 9024 01/04/22 0618 01/05/22 0512 01/06/22 0532  NA 133*   < > 136 135 133* 134* 134*  K 4.5   < > 4.7 4.6 4.4 4.8 4.7  CL 103   < > 108 105 105 102 99  CO2 17*  --  17* 19* 18* 20* 23  GLUCOSE 168*   < > 100* 92 244* 193* 214*  BUN 95*   < > 89* 94* 99* 98* 121*  CREATININE 4.48*   < > 4.12* 3.88* 3.98* 3.93* 4.18*  CALCIUM 8.1*  --  8.6* 8.6* 9.1 10.0 10.3  MG 2.0  --  1.7 1.6* 2.1 2.0  --    < > = values in this interval not displayed.     Liver Function Tests: Recent Labs  Lab 01/01/22 0202 01/02/22 0703 01/03/22 0973 01/05/22 0512 01/06/22 0532  AST 182* 65* 184* 125* 128*  ALT 107* 67* 93* 76* 75*  ALKPHOS 212* 175* 304* 286* 257*  BILITOT 0.7 0.9 0.6 0.5 0.6  PROT 6.3* 6.5 5.8* 6.8 7.4  ALBUMIN 2.1* 2.0* 1.9* 3.0* 3.3*    No results for input(s): LIPASE, AMYLASE in the last 168 hours. No results for input(s): AMMONIA in the last 168 hours.  CBC: Recent Labs  Lab 01/01/22 0752 01/01/22 1446 01/02/22 0703 01/03/22 5329 01/04/22 0618 01/05/22 0512 01/06/22 0532  WBC 13.8*  --  16.4* 13.0*  11.5* 10.5 12.2*  NEUTROABS 11.1*  --   --   --   --   --   --   HGB 6.6*   < > 8.5* 8.0* 7.9* 8.4* 8.3*  HCT 20.5*   < > 25.9* 24.6* 23.9* 26.4* 25.7*  MCV 94.5  --  92.2 92.5 91.2 92.3 92.8  PLT 335  --  405* 360 347 393 395   < > = values in this interval not displayed.     Cardiac Enzymes: No results for input(s): CKTOTAL, CKMB, CKMBINDEX, TROPONINI in the last 168 hours.  BNP: Invalid input(s): POCBNP  CBG: Recent Labs  Lab 01/05/22 1235 01/05/22 1646 01/05/22 2031 01/06/22 0739 01/06/22 0809  GLUCAP 253* 240* 232* 203* 200*     Microbiology: Results for orders placed or performed during the hospital encounter of  12/31/21  Blood culture (routine x 2)     Status: None   Collection Time: 12/31/21 11:35 PM   Specimen: BLOOD  Result Value Ref Range Status   Specimen Description BLOOD RIGHT ARM  Final   Special Requests   Final    BOTTLES DRAWN AEROBIC AND ANAEROBIC Blood Culture adequate volume   Culture   Final    NO GROWTH 5 DAYS Performed at [facility], [address] [place], [place] [address]    Report Status 01/06/2022 FINAL  Final  Blood culture (routine x 2)     Status: None   Collection Time: 12/31/21 11:49 PM   Specimen: BLOOD  Result Value Ref Range Status   Specimen Description BLOOD RIGHT HAND  Final   Special Requests   Final    BOTTLES DRAWN AEROBIC AND ANAEROBIC Blood Culture adequate volume   Culture   Final    NO GROWTH 5 DAYS Performed at [facility], [address] [place], [place] [address]    Report Status 01/06/2022 FINAL  Final  MRSA Next Gen by PCR, Nasal     Status: None   Collection Time: 01/01/22 10:17 AM   Specimen: Nasal Mucosa; Nasal Swab  Result Value Ref Range Status   MRSA by PCR Next Gen NOT DETECTED NOT DETECTED Final    Comment: (NOTE) The GeneXpert MRSA Assay (FDA approved for NASAL specimens only), is one component of a comprehensive MRSA colonization surveillance program. It is not intended to diagnose MRSA infection nor to guide or monitor treatment for MRSA infections. Test performance is not FDA approved in patients less than 26 years old. Performed at [facility], [place], [place] [address]   Aerobic/Anaerobic Culture w Gram Stain (surgical/deep wound)     Status: None (Preliminary result)   Collection Time: 01/01/22  3:37 PM   Specimen: PATH Soft tissue  Result Value Ref Range Status   Specimen Description TISSUE  Final   Special Requests BUTTOCKS WOUND  Final   Gram Stain   Final    NO SQUAMOUS EPITHELIAL CELLS SEEN FEW WBC SEEN RARE GRAM POSITIVE RODS FEW GRAM POSITIVE  COCCI Performed at [facility], [place] [address], [place], [place] [address]    Culture   Final    FEW STAPHYLOCOCCUS AUREUS RARE ENTEROCOCCUS FAECALIS NO ANAEROBES ISOLATED; CULTURE IN PROGRESS FOR 5 DAYS    Report Status PENDING  Incomplete   Organism ID, Bacteria STAPHYLOCOCCUS AUREUS  Final   Organism ID, Bacteria ENTEROCOCCUS FAECALIS  Final      Susceptibility   Enterococcus faecalis - MIC*    AMPICILLIN <=2  SENSITIVE Sensitive     VANCOMYCIN 1 SENSITIVE Sensitive     GENTAMICIN SYNERGY SENSITIVE Sensitive     * RARE ENTEROCOCCUS FAECALIS   Staphylococcus aureus - MIC*    CIPROFLOXACIN >=8 RESISTANT Resistant     ERYTHROMYCIN >=8 RESISTANT Resistant     GENTAMICIN <=0.5 SENSITIVE Sensitive     OXACILLIN <=0.25 SENSITIVE Sensitive     TETRACYCLINE <=1 SENSITIVE Sensitive     VANCOMYCIN 1 SENSITIVE Sensitive     TRIMETH/SULFA <=10 SENSITIVE Sensitive     CLINDAMYCIN >=8 RESISTANT Resistant     RIFAMPIN <=0.5 SENSITIVE Sensitive     Inducible Clindamycin NEGATIVE Sensitive     * FEW STAPHYLOCOCCUS AUREUS  Aerobic/Anaerobic Culture w Gram Stain (surgical/deep wound)     Status: None (Preliminary result)   Collection Time: 01/01/22  3:46 PM   Specimen: PATH Soft tissue  Result Value Ref Range Status   Specimen Description TISSUE  Final   Special Requests BUTTOCKS WOUND NO 2  Final   Gram Stain   Final    NO SQUAMOUS EPITHELIAL CELLS SEEN FEW WBC SEEN FEW GRAM POSITIVE COCCI Performed at [facility], [address], [place], [place] [address]    Culture   Final    MODERATE STAPHYLOCOCCUS AUREUS FEW STAPHYLOCOCCUS EPIDERMIDIS NO ANAEROBES ISOLATED; CULTURE IN PROGRESS FOR 5 DAYS    Report Status PENDING  Incomplete   Organism ID, Bacteria STAPHYLOCOCCUS AUREUS  Final   Organism ID, Bacteria STAPHYLOCOCCUS EPIDERMIDIS  Final      Susceptibility   Staphylococcus aureus - MIC*    CIPROFLOXACIN >=8 RESISTANT Resistant     ERYTHROMYCIN >=8 RESISTANT Resistant      GENTAMICIN <=0.5 SENSITIVE Sensitive     OXACILLIN <=0.25 SENSITIVE Sensitive     TETRACYCLINE <=1 SENSITIVE Sensitive     VANCOMYCIN 1 SENSITIVE Sensitive     TRIMETH/SULFA <=10 SENSITIVE Sensitive     CLINDAMYCIN >=8 RESISTANT Resistant     RIFAMPIN <=0.5 SENSITIVE Sensitive     Inducible Clindamycin NEGATIVE Sensitive     * MODERATE STAPHYLOCOCCUS AUREUS   Staphylococcus epidermidis - MIC*    CIPROFLOXACIN 4 RESISTANT Resistant     ERYTHROMYCIN >=8 RESISTANT Resistant     GENTAMICIN <=0.5 SENSITIVE Sensitive     OXACILLIN >=4 RESISTANT Resistant     TETRACYCLINE 2 SENSITIVE Sensitive     VANCOMYCIN 1 SENSITIVE Sensitive     TRIMETH/SULFA <=10 SENSITIVE Sensitive     CLINDAMYCIN >=8 RESISTANT Resistant     RIFAMPIN <=0.5 SENSITIVE Sensitive     Inducible Clindamycin NEGATIVE Sensitive     * FEW STAPHYLOCOCCUS EPIDERMIDIS    Coagulation Studies: No results for input(s): LABPROT, INR in the last 72 hours.   Urinalysis: No results for input(s): COLORURINE, LABSPEC, PHURINE, GLUCOSEU, HGBUR, BILIRUBINUR, KETONESUR, PROTEINUR, UROBILINOGEN, NITRITE, LEUKOCYTESUR in the last 72 hours.  Invalid input(s): APPERANCEUR     Imaging: No results found.   Medications:      vitamin C  500 mg Oral BID   atorvastatin  40 mg Oral Daily   cephALEXin  250 mg Oral Q8H   fluticasone  2 spray Each Nare Daily   furosemide  40 mg Oral BID   gabapentin  100 mg Oral TID   insulin aspart  0-5 Units Subcutaneous QHS   insulin aspart  0-9 Units Subcutaneous TID WC   insulin aspart  3 Units Subcutaneous TID WC   insulin glargine-yfgn  22 Units Subcutaneous BID   loratadine  10 mg Oral Daily   metoprolol succinate  50 mg Oral BID   multivitamin with minerals  1 tablet Oral Daily   nutrition supplement (JUVEN)  1 packet Oral BID BM   Ensure Max Protein  11 oz Oral QHS   sodium bicarbonate  1,300 mg Oral BID   zinc sulfate  220 mg Oral Daily   hydrALAZINE, morphine injection,  ondansetron **OR** ondansetron (ZOFRAN) IV, oxyCODONE  Assessment/ Plan:  [name] is a 66 y.o.  female with past medical history of hypertension, hyperlipidemia, diabetes, and chronic kidney disease stage V. Patient presents to ed for abnormal labs. She has been admitted for Hyponatremia [E87.1] AKI (acute kidney injury) ([place]) [N17.9] Symptomatic anemia [D64.9] Anemia, unspecified type [D64.9] Severe sepsis ([place]) [A41.9, R65.20]   Acute Kidney Injury with hyponatremia on chronic kidney disease stage V with baseline creatinine 3.91 and GFR of 12 on 12/23/21.  Acute kidney injury secondary to underlying illness Chronic kidney disease is secondary to diabetes and hypertension No exposure to IV contrast.  CT abdomen pelvis negative for obstruction, several renal stones identified.  Losartan and furosemide  held since admission.    Renal function decreased today. Will stop lasix drip and resume Furosemide 40mg  BID. Will stop Albumin. Edema improved. Will continue to monitor.   Lab Results  Component Value Date   CREATININE 4.18 (H) 01/06/2022   CREATININE 3.93 (H) 01/05/2022   CREATININE 3.98 (H) 01/04/2022    Intake/Output Summary (Last 24 hours) at 01/06/2022 1107 Last data filed at 01/06/2022 0900 Gross per 24 hour  Intake 480 ml  Output 350 ml  Net 130 ml    2. Anemia of chronic kidney disease Lab Results  Component Value Date   HGB 8.3 (L) 01/06/2022  Hemoglobin stable at 8.3.  Patient may need ESA outpatient  3. Secondary Hyperparathyroidism:  Lab Results  Component Value Date   CALCIUM 10.3 01/06/2022   CAION 1.11 (L) 01/01/2022  Calcium at goal. Will monitor.  4.  Acute metabolic acidosis.  Resolved with oral medication. Will continue oral repletion, and manage accordingly.   5.  Generalized edema.  Improved. Will transition to oral diuretics.    LOS: 5   6/6/202311:07 AM

## 2022-01-06 NOTE — Progress Notes (Signed)
Inpatient Diabetes Program Recommendations  AACE/ADA: New Consensus Statement on Inpatient Glycemic Control   Target Ranges:  Prepandial:   less than 140 mg/dL      Peak postprandial:   less than 180 mg/dL (1-2 hours)      Critically ill patients:  140 - 180 mg/dL    Latest Reference Range & Units 01/06/22 07:39 01/06/22 08:09  Glucose-Capillary 70 - 99 mg/dL 203 (H) 200 (H)    Latest Reference Range & Units 01/05/22 07:42 01/05/22 12:35 01/05/22 16:46 01/05/22 20:31  Glucose-Capillary 70 - 99 mg/dL 197 (H) 253 (H) 240 (H) 232 (H)   Review of Glycemic Control  Diabetes history: DM2 Outpatient Diabetes medications: Humulin R U500 30 unit mark on U100 syringe in the morning, Humulin R U500 25 unit mark on U100 syringe in the evening Current orders for Inpatient glycemic control: Semglee 20 units BID, Novolog 0-9 units TID with meals, Novolog 0-5 units QHS, Novolog 3 units TID with meals for meal coverage  Inpatient Diabetes Program Recommendations:    Insulin: Noted Novolog 3 units TID with meals for meal coverage ordered today. Please consider increasing Semglee to 22 units BID.  Thanks, Barnie Alderman, RN, MSN, [place] [facility] Coordinator Inpatient Diabetes Program [phone] (Team Pager from 8am to [place])

## 2022-01-07 DIAGNOSIS — R6520 Severe sepsis without septic shock: Secondary | ICD-10-CM | POA: Diagnosis not present

## 2022-01-07 DIAGNOSIS — A419 Sepsis, unspecified organism: Secondary | ICD-10-CM | POA: Diagnosis not present

## 2022-01-07 LAB — CBC
HCT: 28.5 % — ABNORMAL LOW (ref 36.0–46.0)
Hemoglobin: 9.1 g/dL — ABNORMAL LOW (ref 12.0–15.0)
MCH: 29.8 pg (ref 26.0–34.0)
MCHC: 31.9 g/dL (ref 30.0–36.0)
MCV: 93.4 fL (ref 80.0–100.0)
Platelets: 435 10*3/uL — ABNORMAL HIGH (ref 150–400)
RBC: 3.05 MIL/uL — ABNORMAL LOW (ref 3.87–5.11)
RDW: 14.9 % (ref 11.5–15.5)
WBC: 10.4 10*3/uL (ref 4.0–10.5)
nRBC: 0 % (ref 0.0–0.2)

## 2022-01-07 LAB — BASIC METABOLIC PANEL
Anion gap: 12 (ref 5–15)
BUN: 120 mg/dL — ABNORMAL HIGH (ref 8–23)
CO2: 24 mmol/L (ref 22–32)
Calcium: 10.2 mg/dL (ref 8.9–10.3)
Chloride: 96 mmol/L — ABNORMAL LOW (ref 98–111)
Creatinine, Ser: 4.17 mg/dL — ABNORMAL HIGH (ref 0.44–1.00)
GFR, Estimated: 11 mL/min — ABNORMAL LOW (ref >60)
Glucose, Bld: 215 mg/dL — ABNORMAL HIGH (ref 70–99)
Potassium: 4.9 mmol/L (ref 3.5–5.1)
Sodium: 132 mmol/L — ABNORMAL LOW (ref 135–145)

## 2022-01-07 LAB — GLUCOSE, CAPILLARY
Glucose-Capillary: 222 mg/dL — ABNORMAL HIGH (ref 70–99)
Glucose-Capillary: 256 mg/dL — ABNORMAL HIGH (ref 70–99)
Glucose-Capillary: 180 mg/dL — ABNORMAL HIGH (ref 70–99)
Glucose-Capillary: 154 mg/dL — ABNORMAL HIGH (ref 70–99)

## 2022-01-07 LAB — MAGNESIUM: Magnesium: 1.8 mg/dL (ref 1.7–2.4)

## 2022-01-07 MED ORDER — INSULIN ASPART 100 UNIT/ML IJ SOLN
7.0000 [IU] | Freq: Three times a day (TID) | INTRAMUSCULAR | Status: DC
  Administered 2022-01-07 (×3): 7 [IU] via SUBCUTANEOUS
  Filled 2022-01-07 (×3): qty 1

## 2022-01-07 MED ORDER — SODIUM ZIRCONIUM CYCLOSILICATE 10 G PO PACK
10.0000 g | PACK | Freq: Once | ORAL | Status: AC
  Administered 2022-01-07: 10 g via ORAL
  Filled 2022-01-07: qty 1

## 2022-01-07 MED ORDER — INSULIN GLARGINE-YFGN 100 UNIT/ML ~~LOC~~ SOLN
25.0000 [IU] | Freq: Two times a day (BID) | SUBCUTANEOUS | Status: DC
  Administered 2022-01-07 – 2022-01-08 (×4): 25 [IU] via SUBCUTANEOUS
  Filled 2022-01-07 (×5): qty 0.25

## 2022-01-07 NOTE — Progress Notes (Signed)
PROGRESS NOTE    Theresa Warren  YFV:[id number] DOB: Feb 20, 1956 DOA: 12/31/2021 PCP: Crist Infante, MD    Brief Narrative:  [name] is a 66 year old female hyperlipidemia, hypertension, CKD stage V, insulin-dependent diabetes mellitus, who presents emergency department from [facility] for evaluation of low sodium noted on labs at the facility.  She was discharged to rehab on 5/23 after being admitted following a fall and also with acute on chronic anemia.   ED Course - temp 99.6, HR 107, otherwise unremarkable vitals. Labs with sodium 129, potassium 5.1, chloride 99, bicarb 17, BUN of 100, serum creatinine of 4.61, GFR of 10, WBC 18.4, hemoglobin 6.9, platelets of 473.  LFT's were also elevated.   Patient met criteria for sepsis on arrival, treated per protocol in the ED with IV Cefepime, Flagyl, Vanc, and LR 1 L bolus fluids.  Infection source unclear but felt possibly due to sacral wound infection and concern for underlying osteomyelitis.   Admitted to the hospitalist service for further evaluation and management.  Continued on IV Cefepime and Vanc.   General surgery was consulted for debridement on sacral pressure injury.  Nephrology consulted due to worsening renal function  6/7 complaining of pain where the wound VAC is after being changed today no other complaints.  Consultants:  Surgery  Procedures:   Antimicrobials:  Keflex   Subjective: No shortness of breath or dizziness  Objective: Vitals:   01/06/22 2328 01/07/22 0450 01/07/22 0527 01/07/22 0753  BP: 109/63 (!) 128/56  (!) 156/65  Pulse: 85 82  89  Resp: 15 16  16   Temp: 98.3 F (36.8 C) 98.3 F (36.8 C)  98.2 F (36.8 C)  TempSrc:      SpO2: 93% 92%  91%  Weight:   101.7 kg   Height:        Intake/Output Summary (Last 24 hours) at 01/07/2022 0814 Last data filed at 01/07/2022 0500 Gross per 24 hour  Intake 480 ml  Output 1950 ml  Net -1470 ml   Filed Weights   12/31/21 1824  01/06/22 0500 01/07/22 0527  Weight: 97 kg 104.3 kg 101.7 kg    Examination: Calm, NAD Cta no w/r Reg s1/s2 no gallop Soft benign +bs Bilateral lower extremity edema Awake and alert, grossly intact Wound VAC in place Mood and affect appropriate in current setting     Data Reviewed: I have personally reviewed following labs and imaging studies  CBC: Recent Labs  Lab 01/01/22 0752 01/01/22 1446 01/03/22 0613 01/04/22 0618 01/05/22 0512 01/06/22 0532 01/07/22 0629  WBC 13.8*   < > 13.0* 11.5* 10.5 12.2* 10.4  NEUTROABS 11.1*  --   --   --   --   --   --   HGB 6.6*   < > 8.0* 7.9* 8.4* 8.3* 9.1*  HCT 20.5*   < > 24.6* 23.9* 26.4* 25.7* 28.5*  MCV 94.5   < > 92.5 91.2 92.3 92.8 93.4  PLT 335   < > 360 347 393 395 435*   < > = values in this interval not displayed.   Basic Metabolic Panel: Recent Labs  Lab 01/02/22 0703 01/03/22 1638 01/04/22 0618 01/05/22 0512 01/06/22 0532 01/07/22 0629  NA 136 135 133* 134* 134* 132*  K 4.7 4.6 4.4 4.8 4.7 4.9  CL 108 105 105 102 99 96*  CO2 17* 19* 18* 20* 23 24  GLUCOSE 100* 92 244* 193* 214* 215*  BUN 89* 94* 99* 98* 121* 120*  CREATININE 4.12* 3.88* 3.98* 3.93* 4.18* 4.17*  CALCIUM 8.6* 8.6* 9.1 10.0 10.3 10.2  MG 1.7 1.6* 2.1 2.0  --  1.8   GFR: Estimated Creatinine Clearance: 15.5 mL/min (A) (by C-G formula based on SCr of 4.17 mg/dL (H)). Liver Function Tests: Recent Labs  Lab 01/01/22 0202 01/02/22 0703 01/03/22 7628 01/05/22 0512 01/06/22 0532  AST 182* 65* 184* 125* 128*  ALT 107* 67* 93* 76* 75*  ALKPHOS 212* 175* 304* 286* 257*  BILITOT 0.7 0.9 0.6 0.5 0.6  PROT 6.3* 6.5 5.8* 6.8 7.4  ALBUMIN 2.1* 2.0* 1.9* 3.0* 3.3*   No results for input(s): LIPASE, AMYLASE in the last 168 hours. No results for input(s): AMMONIA in the last 168 hours. Coagulation Profile: Recent Labs  Lab 12/31/21 2353  INR 1.4*   Cardiac Enzymes: No results for input(s): CKTOTAL, CKMB, CKMBINDEX, TROPONINI in the last 168  hours. BNP (last 3 results) No results for input(s): PROBNP in the last 8760 hours. HbA1C: No results for input(s): HGBA1C in the last 72 hours. CBG: Recent Labs  Lab 01/06/22 0809 01/06/22 1226 01/06/22 1621 01/06/22 2057 01/07/22 0749  GLUCAP 200* 269* 303* 251* 222*   Lipid Profile: No results for input(s): CHOL, HDL, LDLCALC, TRIG, CHOLHDL, LDLDIRECT in the last 72 hours. Thyroid Function Tests: No results for input(s): TSH, T4TOTAL, FREET4, T3FREE, THYROIDAB in the last 72 hours. Anemia Panel: No results for input(s): VITAMINB12, FOLATE, FERRITIN, TIBC, IRON, RETICCTPCT in the last 72 hours. Sepsis Labs: Recent Labs  Lab 12/31/21 2353 01/01/22 0202 01/01/22 0752  LATICACIDVEN 1.6 2.7* 1.0    Recent Results (from the past 240 hour(s))  Blood culture (routine x 2)     Status: None   Collection Time: 12/31/21 11:35 PM   Specimen: BLOOD  Result Value Ref Range Status   Specimen Description BLOOD RIGHT ARM  Final   Special Requests   Final    BOTTLES DRAWN AEROBIC AND ANAEROBIC Blood Culture adequate volume   Culture   Final    NO GROWTH 5 DAYS Performed at [facility], [address] [place], [place] [address]    Report Status 01/06/2022 FINAL  Final  Blood culture (routine x 2)     Status: None   Collection Time: 12/31/21 11:49 PM   Specimen: BLOOD  Result Value Ref Range Status   Specimen Description BLOOD RIGHT HAND  Final   Special Requests   Final    BOTTLES DRAWN AEROBIC AND ANAEROBIC Blood Culture adequate volume   Culture   Final    NO GROWTH 5 DAYS Performed at [facility], [address], [place], [place] [address]    Report Status 01/06/2022 FINAL  Final  MRSA Next Gen by PCR, Nasal     Status: None   Collection Time: 01/01/22 10:17 AM   Specimen: Nasal Mucosa; Nasal Swab  Result Value Ref Range Status   MRSA by PCR Next Gen NOT DETECTED NOT DETECTED Final    Comment: (NOTE) The GeneXpert MRSA Assay (FDA approved for  NASAL specimens only), is one component of a comprehensive MRSA colonization surveillance program. It is not intended to diagnose MRSA infection nor to guide or monitor treatment for MRSA infections. Test performance is not FDA approved in patients less than 29 years old. Performed at [facility] [address] [place], [place] [address]   Aerobic/Anaerobic Culture w Gram Stain (surgical/deep wound)     Status: None   Collection Time: 01/01/22  3:37 PM  Specimen: PATH Soft tissue  Result Value Ref Range Status   Specimen Description TISSUE  Final   Special Requests BUTTOCKS WOUND  Final   Gram Stain   Final    NO SQUAMOUS EPITHELIAL CELLS SEEN FEW WBC SEEN RARE GRAM POSITIVE RODS FEW GRAM POSITIVE COCCI    Culture   Final    FEW STAPHYLOCOCCUS AUREUS RARE ENTEROCOCCUS FAECALIS NO ANAEROBES ISOLATED Performed at [facility], [address] [place], [place] [address]    Report Status 01/06/2022 FINAL  Final   Organism ID, Bacteria STAPHYLOCOCCUS AUREUS  Final   Organism ID, Bacteria ENTEROCOCCUS FAECALIS  Final      Susceptibility   Enterococcus faecalis - MIC*    AMPICILLIN <=2 SENSITIVE Sensitive     VANCOMYCIN 1 SENSITIVE Sensitive     GENTAMICIN SYNERGY SENSITIVE Sensitive     * RARE ENTEROCOCCUS FAECALIS   Staphylococcus aureus - MIC*    CIPROFLOXACIN >=8 RESISTANT Resistant     ERYTHROMYCIN >=8 RESISTANT Resistant     GENTAMICIN <=0.5 SENSITIVE Sensitive     OXACILLIN <=0.25 SENSITIVE Sensitive     TETRACYCLINE <=1 SENSITIVE Sensitive     VANCOMYCIN 1 SENSITIVE Sensitive     TRIMETH/SULFA <=10 SENSITIVE Sensitive     CLINDAMYCIN >=8 RESISTANT Resistant     RIFAMPIN <=0.5 SENSITIVE Sensitive     Inducible Clindamycin NEGATIVE Sensitive     * FEW STAPHYLOCOCCUS AUREUS  Aerobic/Anaerobic Culture w Gram Stain (surgical/deep wound)     Status: None   Collection Time: 01/01/22  3:46 PM   Specimen: PATH Soft tissue  Result Value Ref Range Status    Specimen Description TISSUE  Final   Special Requests BUTTOCKS WOUND NO 2  Final   Gram Stain   Final    NO SQUAMOUS EPITHELIAL CELLS SEEN FEW WBC SEEN FEW GRAM POSITIVE COCCI    Culture   Final    MODERATE STAPHYLOCOCCUS AUREUS FEW STAPHYLOCOCCUS EPIDERMIDIS NO ANAEROBES ISOLATED Performed at [facility], [address], [place], [place] [address]    Report Status 01/06/2022 FINAL  Final   Organism ID, Bacteria STAPHYLOCOCCUS AUREUS  Final   Organism ID, Bacteria STAPHYLOCOCCUS EPIDERMIDIS  Final      Susceptibility   Staphylococcus aureus - MIC*    CIPROFLOXACIN >=8 RESISTANT Resistant     ERYTHROMYCIN >=8 RESISTANT Resistant     GENTAMICIN <=0.5 SENSITIVE Sensitive     OXACILLIN <=0.25 SENSITIVE Sensitive     TETRACYCLINE <=1 SENSITIVE Sensitive     VANCOMYCIN 1 SENSITIVE Sensitive     TRIMETH/SULFA <=10 SENSITIVE Sensitive     CLINDAMYCIN >=8 RESISTANT Resistant     RIFAMPIN <=0.5 SENSITIVE Sensitive     Inducible Clindamycin NEGATIVE Sensitive     * MODERATE STAPHYLOCOCCUS AUREUS   Staphylococcus epidermidis - MIC*    CIPROFLOXACIN 4 RESISTANT Resistant     ERYTHROMYCIN >=8 RESISTANT Resistant     GENTAMICIN <=0.5 SENSITIVE Sensitive     OXACILLIN >=4 RESISTANT Resistant     TETRACYCLINE 2 SENSITIVE Sensitive     VANCOMYCIN 1 SENSITIVE Sensitive     TRIMETH/SULFA <=10 SENSITIVE Sensitive     CLINDAMYCIN >=8 RESISTANT Resistant     RIFAMPIN <=0.5 SENSITIVE Sensitive     Inducible Clindamycin NEGATIVE Sensitive     * FEW STAPHYLOCOCCUS EPIDERMIDIS         Radiology Studies: No results found.      Scheduled Meds:  vitamin C  500 mg Oral BID  atorvastatin  40 mg Oral Daily   cephALEXin  250 mg Oral Q8H   fluticasone  2 spray Each Nare Daily   furosemide  40 mg Oral BID   gabapentin  100 mg Oral TID   insulin aspart  0-5 Units Subcutaneous QHS   insulin aspart  0-9 Units Subcutaneous TID WC   insulin aspart  7 Units Subcutaneous TID WC   insulin  glargine-yfgn  25 Units Subcutaneous BID   loratadine  10 mg Oral Daily   metoprolol succinate  50 mg Oral BID   multivitamin with minerals  1 tablet Oral Daily   nutrition supplement (JUVEN)  1 packet Oral BID BM   Ensure Max Protein  11 oz Oral QHS   sodium bicarbonate  1,300 mg Oral BID   zinc sulfate  220 mg Oral Daily   Continuous Infusions:  Assessment & Plan:   Principal Problem:   Severe sepsis ([place]) Active Problems:   Chronic kidney disease, stage 5 ([place])   Essential hypertension   DNR (do not resuscitate)/DNI(Do Not Intubate)   Type 2 diabetes mellitus with chronic kidney disease, with long-term current use of insulin ([place])   Sacral wound, initial encounter   Swelling of left lower extremity   Abdominal pain   AKI (acute kidney injury) ([place])   Hypomagnesemia   Symptomatic anemia   Hyponatremia   Premature atrial complexes   OSA (obstructive sleep apnea)   Pressure injury of skin   Elevated LFTs   Anasarca associated with disorder of kidney    * Severe sepsis (HCC) POA as evidenced by tachycardia, leukocytosis, AKI and transaminitis consistent with organ dysfunction and severe sepsis with suspected infection source being sacral wounds versus an abdominal source (CT abdomen pelvis negative for anything acute however).  MRSA PCR negative. Wound cultures grew few Staph aureus, rare Enterococcus faecalis. Initially treated with vancomycin/cefepime. Vanc stopped 6/4. 6/7 was started on Keflex. the plan is to treat the MSSA in both cultures. enterococcus likely contaminant from stool d/t location of wounds and MRSE likely colonization of skin.     Type 2 diabetes mellitus with chronic kidney disease, with long-term current use of insulin (HCC) BG elevated Increase NovoLog to 7 units 3 times daily Increase Levemir to 25 units twice daily    Essential hypertension Losartan held due to AKI. 6/7 stable Started on Lasix p.o. by nephrology IV as needed hydralazine       AKI on Chronic kidney disease, stage 5 (HCC) Hyponatremia Treated IV Lasix and albumin per nephrology.  6/7 bilateral lower extremity improving AKI secondary to underlying illness  CKD secondary to diabetes and hypertension  No exposure to IV contrast  CT abdomen pelvis negative for obstruction, several renal stones identified.   Losartan held due to AKI  Now switched to Lasix p.o. 40 mg twice daily  Albumin will be stopped  Avoid nephrotoxic meds   sacral wound, initial encounter See pressure injury    Abdominal pain Patient reported on admission.  CT of abdomen pelvis showed abnormal endometrial thickening for a postmenopausal patient, bladder distention estimated 520 mL, no other acute or inflammatory processes or acute findings. --Monitor clinically for now -- Needs OB/GYN follow-up for endometrial sampling to exclude endometrial carcinoma   Swelling of left lower extremity With redness, patient attributes it to clindamycin.  She reported on admission that this has been present for over one month.   Left lower extremity Doppler US on 12/18/21 was negative for DVT.  Monitor closely  for now.         Symptomatic anemia Hemoglobin on admission was 6.9 >> 6.3, down from 7.6 at time of discharge on 5/23.  No signs of active bleeding, did have vaginal/endometrial bleeding previous admission but no reports of this recently.  May have slow chronic blood loss from sacral wounds.  Has baseline anemia of chronic disease due to renal failure. Received 1 unit PRBC transfusion on admission. -- Transfusing another unit RBCs today -- Trend hemoglobin and transfuse if less than 7   Hypomagnesemia Resolved with replacement on 6/3. Monitor and replace Mg as needed.     Premature atrial complexes Patient became significantly tachycardic on 6/2 with heart rates sustaining in the 130s.  Cardiology reviewed telemetry and feels patient dysrhythmias were not A-fib but sinus tach with  PACs. --Cardiology following -- Continue Lopressor 25 mg BID -- Continue monitoring telemetry    Elevated LFTs On admission, alk phos 212, AST 182, ALT 107.  Unclear etiology, repeat LFTs not available today.  Appears LFTs were elevated at time of discharge on 5/23 but have increased since. RUQ ultrasound unremarkable. Acute hepatitis panel negative. -- Follow CMP   Pressure injury of skin Present on admission.  Patient has multiple sacral pressure injuries.  Some concern at time of admission that these were the source of sepsis. General surgery consulted and took patient to the OR on 6/1 for debridement.   Now with wound VAC. --Initial wound VAC change Monday 6/5 then every M/W/F -- Wound care consulted -- Frequent repositioning --Follow general surgery recommendations --Pain control --Follow blood cultures Pressure Injury 01/01/22 Buttocks Left Stage 3 -  Full thickness tissue loss. Subcutaneous fat may be visible but bone, tendon or muscle are NOT exposed. (Active)  01/01/22 0222  Location: Buttocks  Location Orientation: Left  Staging: Stage 3 -  Full thickness tissue loss. Subcutaneous fat may be visible but bone, tendon or muscle are NOT exposed.  Wound Description (Comments):   Present on Admission: Yes     Pressure Injury 01/01/22 Buttocks Right Stage 2 -  Partial thickness loss of dermis presenting as a shallow open injury with a red, pink wound bed without slough. (Active)  01/01/22 0223  Location: Buttocks  Location Orientation: Right  Staging: Stage 2 -  Partial thickness loss of dermis presenting as a shallow open injury with a red, pink wound bed without slough.  Wound Description (Comments):   Present on Admission: Yes     Pressure Injury 01/01/22 Hip Left Stage 2 -  Partial thickness loss of dermis presenting as a shallow open injury with a red, pink wound bed without slough. (Active)  01/01/22 0225  Location: Hip  Location Orientation: Left  Staging: Stage 2  -  Partial thickness loss of dermis presenting as a shallow open injury with a red, pink wound bed without slough.  Wound Description (Comments):   Present on Admission: Yes     Pressure Injury 01/01/22 Hip Right Stage 3 -  Full thickness tissue loss. Subcutaneous fat may be visible but bone, tendon or muscle are NOT exposed. (Active)  01/01/22 0225  Location: Hip  Location Orientation: Right  Staging: Stage 3 -  Full thickness tissue loss. Subcutaneous fat may be visible but bone, tendon or muscle are NOT exposed.  Wound Description (Comments):   Present on Admission: Yes          OSA (obstructive sleep apnea) .  Appears not to be on CPAP  DVT prophylaxis: SCD Code Status: DNR Family Communication: None at bedside Disposition Plan:  Status is: Inpatient Remains inpatient appropriate because:iv treatment. Worsening of renal function. Back to snf when cleared by nephrology and surgery        LOS: 6 days   Time spent 35 minutes with more than 50% on [place], MD [facility] Pager [phone]  If 7PM-7AM, please contact night-coverage 01/07/2022, 8:14 AM

## 2022-01-07 NOTE — Progress Notes (Signed)
Physical Therapy Treatment Patient Details Name: Theresa Warren MRN: [id number] DOB: 06-Oct-1955 Today's Date: 01/07/2022   History of Present Illness Theresa Warren is a 66 year old female admitted for evaluation of low sodium noted on labs at the facility.  Pt was discharged  to rehab on 5/23 after being admitted following a fall. PMH significant for acute on chronic anemia. hyperlipidemia, hypertension, CKD stage V, insulin-dependent diabetes mellitus    PT Comments    Pt received upright in bed finishing lunch. Some confusion noted as pt reports having no fork but was holding a fork in her R hand. With HoB elevated pt able to transfer to EOB with minA at torso. Pt able to stand with mingaurd to RW and begin ambulation to recliner ~3' away. Initially pt taking steps without difficulty. AS pt progressed to edge of recliner pt endorsing her legs "giving away" with inability of PT to maxA pt to recliner. Relied on PT controlled descent to floor due to pt's LE's inability to hold pt upright with RW for support. PT able to receive help from 3 RN's with total assist lift from floor to seated in recliner. Vitals recorded BP at 159/71 mm Hg, HR: 101 BPM, SPO2 initially 86-86% but returns to >/= 90% with seated rest and PLB. After ~ 5 minutes seated, Pt able to stand minguard to RW at EOB and step pivot to EOB with maxA+2 to return to supine in bed. Pt in care of RN's and attending MD notified. No injury noted to pt or author. D/c recs remain appropriate due to pt's falls risk at this time.    Recommendations for follow up therapy are one component of a multi-disciplinary discharge planning process, led by the attending physician.  Recommendations may be updated based on patient status, additional functional criteria and insurance authorization.  Follow Up Recommendations  Skilled nursing-short term rehab (<3 hours/day)     Assistance Recommended at Discharge Frequent or constant  Supervision/Assistance  Patient can return home with the following A little help with walking and/or transfers;A lot of help with bathing/dressing/bathroom;Assistance with cooking/housework;Help with stairs or ramp for entrance;Assist for transportation   Equipment Recommendations  Rolling walker (2 wheels);BSC/3in1    Recommendations for Other Services       Precautions / Restrictions Precautions Precautions: Fall Restrictions Weight Bearing Restrictions: No     Mobility  Bed Mobility Overal bed mobility: Needs Assistance Bed Mobility: Supine to Sit     Supine to sit: HOB elevated, Min guard Sit to supine: Max assist, +2 for physical assistance     Patient Response: Cooperative  Transfers Overall transfer level: Needs assistance Equipment used: Rolling walker (2 wheels) Transfers: Sit to/from Stand Sit to Stand: Min guard                Ambulation/Gait Ambulation/Gait assistance: Counsellor (Feet): 3 Feet Assistive device: Rolling walker (2 wheels) Gait Pattern/deviations: Step-through pattern, Decreased step length - right, Decreased step length - left, Decreased stride length, Trunk flexed       General Gait Details: Pt ambulating 3' to edge of recliner prior to slowly controlling descent to floor with pt stating her legs "gave out on me"   Stairs             Wheelchair Mobility    Modified Rankin (Stroke Patients Only)       Balance Overall balance assessment: Needs assistance Sitting-balance support: No upper extremity supported, Feet supported Sitting balance-Leahy Scale: Good  Standing balance support: Bilateral upper extremity supported Standing balance-Leahy Scale: Fair                              Cognition Arousal/Alertness: Awake/alert Behavior During Therapy: WFL for tasks assessed/performed Overall Cognitive Status: Impaired/Different from baseline                                  General Comments: Pt reporting hallucinating and has some confusion Stating she did not have a fork for lunch but was holding a fork.        Exercises      General Comments        Pertinent Vitals/Pain Pain Assessment Pain Assessment: Faces Faces Pain Scale: Hurts little more Pain Location: sacrum Pain Descriptors / Indicators: Discomfort, Grimacing Pain Intervention(s): Limited activity within patient's tolerance, Monitored during session, Repositioned    Home Living                          Prior Function            PT Goals (current goals can now be found in the care plan section) Acute Rehab PT Goals Patient Stated Goal: to go home PT Goal Formulation: With patient Time For Goal Achievement: 01/17/22 Potential to Achieve Goals: Good Progress towards PT goals: Not progressing toward goals - comment (pt ambulating on 01/06/22 with 1 person assist. On 01/07/22 unable to safely ambulate > 2-3' with RW to recliner before having controlled descent to floor.)    Frequency    Min 2X/week      PT Plan Current plan remains appropriate    Co-evaluation              AM-PAC PT "6 Clicks" Mobility   Outcome Measure  Help needed turning from your back to your side while in a flat bed without using bedrails?: A Lot Help needed moving from lying on your back to sitting on the side of a flat bed without using bedrails?: A Lot Help needed moving to and from a bed to a chair (including a wheelchair)?: A Lot Help needed standing up from a chair using your arms (e.g., wheelchair or bedside chair)?: A Little Help needed to walk in hospital room?: A Lot Help needed climbing 3-5 steps with a railing? : Total 6 Click Score: 12    End of Session Equipment Utilized During Treatment: Gait belt Activity Tolerance: Patient limited by fatigue Patient left: in bed;with nursing/sitter in room;with call bell/phone within reach Nurse Communication: Mobility status PT Visit  Diagnosis: Unsteadiness on feet (R26.81);Muscle weakness (generalized) (M62.81);Difficulty in walking, not elsewhere classified (R26.2)     Time: 5170-0174 PT Time Calculation (min) (ACUTE ONLY): 36 min  Charges:  $Therapeutic Activity: 8-22 mins                    Janiel Derhammer M. Fairly IV, PT, DPT Physical Therapist- [facility]  01/07/2022, 4:29 PM

## 2022-01-07 NOTE — Progress Notes (Signed)
Inpatient Diabetes Program Recommendations  AACE/ADA: New Consensus Statement on Inpatient Glycemic Control   Target Ranges:  Prepandial:   less than 140 mg/dL      Peak postprandial:   less than 180 mg/dL (1-2 hours)      Critically ill patients:  140 - 180 mg/dL    Latest Reference Range & Units 01/06/22 07:39 01/06/22 08:09 01/06/22 12:26 01/06/22 16:21 01/06/22 20:57 01/07/22 07:49  Glucose-Capillary 70 - 99 mg/dL 203 (H) 200 (H) 269 (H) 303 (H) 251 (H) 222 (H)   Review of Glycemic Control  Diabetes history: DM2 Outpatient Diabetes medications: Humulin R U500 30 unit mark on U100 syringe in the morning, Humulin R U500 25 unit mark on U100 syringe in the evening Current orders for Inpatient glycemic control: Semglee 22 units BID, Novolog 0-9 units TID with meals, Novolog 0-5 units QHS, Novolog 3 units TID with meals for meal coverage   Inpatient Diabetes Program Recommendations:    Insulin: Please consider increasing Semglee to 25 units BID and meal coverage to Novolog 6 units TID with meals.  Thanks, Barnie Alderman, RN, MSN, [place] Diabetes Coordinator Inpatient Diabetes Program [phone] (Team Pager from 8am to [place])

## 2022-01-07 NOTE — TOC Initial Note (Signed)
Transition of Care [facility]) - Initial/Assessment Note    Patient Details  Name: Theresa Warren MRN: [id number] Date of Birth: January 15, 1956  Transition of Care [facility]) CM/SW Contact:    Pete Pelt, RN Phone Number: 01/07/2022, 4:07 PM  Clinical Narrative:   Patient lives at home alone, states she has no assistance at home at all.  Discussed recommendation for skilled nursing, patient adamantly refusing skilled nursing.  When asked what she anticipates for a discharge plan, patient states "I do not know, but I am not going to skilled nursing."  Care team made aware, TOC will follow up with care team and explore other options for discharge planning.                Expected Discharge Plan:  (TBD) Barriers to Discharge: Continued Medical Work up   Patient Goals and CMS Choice        Expected Discharge Plan and Services Expected Discharge Plan:  (TBD)   Discharge Planning Services: CM Consult Post Acute Care Choice:  (TBD) Living arrangements for the past 2 months: Single Family Home                                      Prior Living Arrangements/Services Living arrangements for the past 2 months: Single Family Home Lives with:: Self Patient language and need for interpreter reviewed:: Yes Do you feel safe going back to the place where you live?: Yes (See narrative notes for details)      Need for Family Participation in Patient Care: Yes (Comment) Care giver support system in place?: No (comment)   Criminal Activity/Legal Involvement Pertinent to Current Situation/Hospitalization: No - Comment as needed  Activities of Daily Living Home Assistive Devices/Equipment: Walker (specify type) ADL Screening (condition at time of admission) Patient's cognitive ability adequate to safely complete daily activities?: Yes Is the patient deaf or have difficulty hearing?: No Does the patient have difficulty seeing, even when wearing glasses/contacts?: No Does the patient have  difficulty concentrating, remembering, or making decisions?: No Patient able to express need for assistance with ADLs?: Yes Does the patient have difficulty dressing or bathing?: Yes Independently performs ADLs?: No Communication: Independent Dressing (OT): Needs assistance Is this a change from baseline?: Pre-admission baseline Grooming: Needs assistance Is this a change from baseline?: Pre-admission baseline Feeding: Independent Is this a change from baseline?: Pre-admission baseline Bathing: Needs assistance Is this a change from baseline?: Pre-admission baseline Toileting: Needs assistance Is this a change from baseline?: Pre-admission baseline In/Out Bed: Needs assistance Is this a change from baseline?: Pre-admission baseline Walks in Home: Independent with device (comment) Does the patient have difficulty walking or climbing stairs?: Yes Weakness of Legs: Both Weakness of Arms/Hands: None  Permission Sought/Granted Permission sought to share information with : Case Manager Permission granted to share information with : Yes, Verbal Permission Granted              Emotional Assessment Appearance:: Appears stated age Attitude/Demeanor/Rapport: Gracious, Engaged Affect (typically observed): Pleasant, Appropriate Orientation: : Oriented to Self, Oriented to Place, Oriented to  Time Alcohol / Substance Use: Not Applicable Psych Involvement: No (comment)  Admission diagnosis:  Hyponatremia [E87.1] AKI (acute kidney injury) ([place]) [N17.9] Symptomatic anemia [D64.9] Anemia, unspecified type [D64.9] Severe sepsis ([place]) [A41.9, R65.20] Patient Active Problem List   Diagnosis Date Noted   Anasarca associated with disorder of kidney 01/05/2022   Premature atrial complexes  01/02/2022   Sacral wound, initial encounter 01/01/2022   Swelling of left lower extremity 01/01/2022   Abdominal pain 01/01/2022   AKI (acute kidney injury) ([place]) 01/01/2022   Hyponatremia 01/01/2022    Symptomatic anemia 12/31/2021   Elevated LFTs 12/31/2021   Severe sepsis ([place]) 12/31/2021   Hypokalemia 12/21/2021   Pressure injury of skin 12/20/2021   Hypomagnesemia 12/19/2021   Acute blood loss anemia 12/19/2021   Abnormal vaginal bleeding 12/19/2021   Uncontrolled type 2 diabetes mellitus with hyperglycemia, with long-term current use of insulin ([place]) 12/19/2021   Chronic kidney disease, stage 5 ([place]) 12/18/2021   Chronic pain syndrome 12/18/2021   Fall at home 12/18/2021   DNR (do not resuscitate)/DNI(Do Not Intubate) 12/18/2021   Type 2 diabetes mellitus with chronic kidney disease, with long-term current use of insulin ([place]) 12/18/2021   Anemia of renal disease 12/18/2021   Long term (current) use of insulin ([place]) 08/30/2018   OSA (obstructive sleep apnea) 11/27/2014   Sleep disturbance 10/10/2014   Severe obesity (BMI >= 40) ([place]) 10/10/2014   Palpitation 09/11/2014   Dyspnea 09/11/2014   Morbid (severe) obesity due to excess calories ([place]) 07/01/2010   Essential hypertension 06/06/2009   PCP:  Crist Infante, MD Pharmacy:   [facility] [place] [address] Phone: [phone] Fax: [phone]     Social Determinants of Health (SDOH) Interventions    Readmission Risk Interventions     View : No data to display.

## 2022-01-07 NOTE — Progress Notes (Signed)
Pt lower to floor by PT while therapy session. Controlled fall. Did not hurt herself. Vitals stable. MD and charge nurse made aware. Will continue to monitor

## 2022-01-07 NOTE — Progress Notes (Addendum)
Brief Note:  Discussed vac change today with WOC RN. Unfortunately, I was unable to assess the wound myself today during vac change. No issues reported. I have discussed vac change on Friday 06/09 with them and we will coordinate so I can evaluate the wound again. She is without leukocytosis this morning; WBC 10.4K. Again, Cx staph aureus and epidermidis; sensitivities reviewed. She is on Keflex (started 06/05). PT/OT notes reviewed; recommendations SNF.   -- Nothing further to add from general surgery standpoint; no further debridement -- I will coordinate dressing change on the morning of 06/09 with [place] team -- As I imagine she will return to SNF, I will place home health orders to aid in getting wound vac for discharge (if feasible).  -- Continue Abx  -- Continue local wound care: pressure offload, frequent repositioning, +/- low air loss mattress  Please call with questions/concerns. Discharge once medically clear.   -- Edison Simon, PA-C [facility] 01/07/2022, 9:33 AM M-F: 7am - 4pm

## 2022-01-07 NOTE — Progress Notes (Addendum)
[facility]  ROUNDING NOTE   Subjective:   Theresa Warren is a 66 y.o. female with past medical history of hypertension, hyperlipidemia, diabetes, and chronic kidney disease stage V. Patient presents to ed for abnormal labs. She has been admitted for Hyponatremia [E87.1] AKI (acute kidney injury) ([place]) [N17.9] Symptomatic anemia [D64.9] Anemia, unspecified type [D64.9] Severe sepsis ([place]) [A41.9, R65.20]  Patient is known to our clinic from previous admissions. She is followed by Dr Arty Baumgartner in [place] at [facility]. She states she recently was seen in office and advised to come to hospital for abnormal sodium labs. Patient states she was at [facility] receiving rehab, able to ambulate with assistance. She denies weakness, loss of appetite, or nausea and vomiting. Reports pain from sacral wound. Denies shortness of breath. States nephrology was preparing for vein mapping to prepare for dialysis in the future. She continues to make urine.    Update:  Patient seen resting in bed Alert and oriented  Lower extremity edema improved.  Patient seen later in afternoon Appears drowsy, unable to focus on current conversation Upper extremity jerks   Objective:  Vital signs in last 24 hours:  Temp:  [98.1 F (36.7 C)-100.2 F (37.9 C)] 100.2 F (37.9 C) (06/07 1145) Pulse Rate:  [82-93] 93 (06/07 1145) Resp:  [15-20] 18 (06/07 1145) BP: (109-156)/(56-65) 145/57 (06/07 1145) SpO2:  [91 %-95 %] 95 % (06/07 1145) Weight:  [101.7 kg] 101.7 kg (06/07 0527)  Weight change: -2.6 kg Filed Weights   12/31/21 1824 01/06/22 0500 01/07/22 0527  Weight: 97 kg 104.3 kg 101.7 kg    Intake/Output: I/O last 3 completed shifts: In: 480 [P.O.:480] Out: 2300 [Urine:2300]   Intake/Output this shift:  Total I/O In: 180 [P.O.:180] Out: -   Physical Exam: General: NAD  Head: Normocephalic, atraumatic. Moist oral mucosal membranes  Eyes: Anicteric  Lungs:  Clear  to auscultation, normal effort, room air  Heart: Regular rhythm, tachycardia  Abdomen:  Soft, nontender, obese  Extremities: 1+ peripheral edema.  Neurologic: Nonfocal, moving all four extremities  Skin: No acute rash, sacral wound with wound vac  Access: None    Basic Metabolic Panel: Recent Labs  Lab 01/02/22 0703 01/03/22 0623 01/04/22 0618 01/05/22 0512 01/06/22 0532 01/07/22 0629  NA 136 135 133* 134* 134* 132*  K 4.7 4.6 4.4 4.8 4.7 4.9  CL 108 105 105 102 99 96*  CO2 17* 19* 18* 20* 23 24  GLUCOSE 100* 92 244* 193* 214* 215*  BUN 89* 94* 99* 98* 121* 120*  CREATININE 4.12* 3.88* 3.98* 3.93* 4.18* 4.17*  CALCIUM 8.6* 8.6* 9.1 10.0 10.3 10.2  MG 1.7 1.6* 2.1 2.0  --  1.8     Liver Function Tests: Recent Labs  Lab 01/01/22 0202 01/02/22 0703 01/03/22 0613 01/05/22 0512 01/06/22 0532  AST 182* 65* 184* 125* 128*  ALT 107* 67* 93* 76* 75*  ALKPHOS 212* 175* 304* 286* 257*  BILITOT 0.7 0.9 0.6 0.5 0.6  PROT 6.3* 6.5 5.8* 6.8 7.4  ALBUMIN 2.1* 2.0* 1.9* 3.0* 3.3*    No results for input(s): LIPASE, AMYLASE in the last 168 hours. No results for input(s): AMMONIA in the last 168 hours.  CBC: Recent Labs  Lab 01/01/22 0752 01/01/22 1446 01/03/22 0613 01/04/22 0618 01/05/22 0512 01/06/22 0532 01/07/22 0629  WBC 13.8*   < > 13.0* 11.5* 10.5 12.2* 10.4  NEUTROABS 11.1*  --   --   --   --   --   --  HGB 6.6*   < > 8.0* 7.9* 8.4* 8.3* 9.1*  HCT 20.5*   < > 24.6* 23.9* 26.4* 25.7* 28.5*  MCV 94.5   < > 92.5 91.2 92.3 92.8 93.4  PLT 335   < > 360 347 393 395 435*   < > = values in this interval not displayed.     Cardiac Enzymes: No results for input(s): CKTOTAL, CKMB, CKMBINDEX, TROPONINI in the last 168 hours.  BNP: Invalid input(s): POCBNP  CBG: Recent Labs  Lab 01/06/22 1226 01/06/22 1621 01/06/22 2057 01/07/22 0749 01/07/22 1142  GLUCAP 269* 303* 251* 222* 256*     Microbiology: Results for orders placed or performed during the  hospital encounter of 12/31/21  Blood culture (routine x 2)     Status: None   Collection Time: 12/31/21 11:35 PM   Specimen: BLOOD  Result Value Ref Range Status   Specimen Description BLOOD RIGHT ARM  Final   Special Requests   Final    BOTTLES DRAWN AEROBIC AND ANAEROBIC Blood Culture adequate volume   Culture   Final    NO GROWTH 5 DAYS Performed at [facility], [address] [place], [place] [address]    Report Status 01/06/2022 FINAL  Final  Blood culture (routine x 2)     Status: None   Collection Time: 12/31/21 11:49 PM   Specimen: BLOOD  Result Value Ref Range Status   Specimen Description BLOOD RIGHT HAND  Final   Special Requests   Final    BOTTLES DRAWN AEROBIC AND ANAEROBIC Blood Culture adequate volume   Culture   Final    NO GROWTH 5 DAYS Performed at [facility], [address] [place], [place] [address]    Report Status 01/06/2022 FINAL  Final  MRSA Next Gen by PCR, Nasal     Status: None   Collection Time: 01/01/22 10:17 AM   Specimen: Nasal Mucosa; Nasal Swab  Result Value Ref Range Status   MRSA by PCR Next Gen NOT DETECTED NOT DETECTED Final    Comment: (NOTE) The GeneXpert MRSA Assay (FDA approved for NASAL specimens only), is one component of a comprehensive MRSA colonization surveillance program. It is not intended to diagnose MRSA infection nor to guide or monitor treatment for MRSA infections. Test performance is not FDA approved in patients less than 51 years old. Performed at [facility], [place], [place] [address]   Aerobic/Anaerobic Culture w Gram Stain (surgical/deep wound)     Status: None   Collection Time: 01/01/22  3:37 PM   Specimen: PATH Soft tissue  Result Value Ref Range Status   Specimen Description TISSUE  Final   Special Requests BUTTOCKS WOUND  Final   Gram Stain   Final    NO SQUAMOUS EPITHELIAL CELLS SEEN FEW WBC SEEN RARE GRAM POSITIVE RODS FEW GRAM POSITIVE COCCI     Culture   Final    FEW STAPHYLOCOCCUS AUREUS RARE ENTEROCOCCUS FAECALIS NO ANAEROBES ISOLATED Performed at [facility], [address], [place], [place] [address]    Report Status 01/06/2022 FINAL  Final   Organism ID, Bacteria STAPHYLOCOCCUS AUREUS  Final   Organism ID, Bacteria ENTEROCOCCUS FAECALIS  Final      Susceptibility   Enterococcus faecalis - MIC*    AMPICILLIN <=2 SENSITIVE Sensitive     VANCOMYCIN 1 SENSITIVE Sensitive     GENTAMICIN SYNERGY SENSITIVE Sensitive     * RARE ENTEROCOCCUS FAECALIS   Staphylococcus aureus -  MIC*    CIPROFLOXACIN >=8 RESISTANT Resistant     ERYTHROMYCIN >=8 RESISTANT Resistant     GENTAMICIN <=0.5 SENSITIVE Sensitive     OXACILLIN <=0.25 SENSITIVE Sensitive     TETRACYCLINE <=1 SENSITIVE Sensitive     VANCOMYCIN 1 SENSITIVE Sensitive     TRIMETH/SULFA <=10 SENSITIVE Sensitive     CLINDAMYCIN >=8 RESISTANT Resistant     RIFAMPIN <=0.5 SENSITIVE Sensitive     Inducible Clindamycin NEGATIVE Sensitive     * FEW STAPHYLOCOCCUS AUREUS  Aerobic/Anaerobic Culture w Gram Stain (surgical/deep wound)     Status: None   Collection Time: 01/01/22  3:46 PM   Specimen: PATH Soft tissue  Result Value Ref Range Status   Specimen Description TISSUE  Final   Special Requests BUTTOCKS WOUND NO 2  Final   Gram Stain   Final    NO SQUAMOUS EPITHELIAL CELLS SEEN FEW WBC SEEN FEW GRAM POSITIVE COCCI    Culture   Final    MODERATE STAPHYLOCOCCUS AUREUS FEW STAPHYLOCOCCUS EPIDERMIDIS NO ANAEROBES ISOLATED Performed at [facility], [place] [address], [place], [place] [address]    Report Status 01/06/2022 FINAL  Final   Organism ID, Bacteria STAPHYLOCOCCUS AUREUS  Final   Organism ID, Bacteria STAPHYLOCOCCUS EPIDERMIDIS  Final      Susceptibility   Staphylococcus aureus - MIC*    CIPROFLOXACIN >=8 RESISTANT Resistant     ERYTHROMYCIN >=8 RESISTANT Resistant     GENTAMICIN <=0.5 SENSITIVE Sensitive     OXACILLIN <=0.25 SENSITIVE Sensitive      TETRACYCLINE <=1 SENSITIVE Sensitive     VANCOMYCIN 1 SENSITIVE Sensitive     TRIMETH/SULFA <=10 SENSITIVE Sensitive     CLINDAMYCIN >=8 RESISTANT Resistant     RIFAMPIN <=0.5 SENSITIVE Sensitive     Inducible Clindamycin NEGATIVE Sensitive     * MODERATE STAPHYLOCOCCUS AUREUS   Staphylococcus epidermidis - MIC*    CIPROFLOXACIN 4 RESISTANT Resistant     ERYTHROMYCIN >=8 RESISTANT Resistant     GENTAMICIN <=0.5 SENSITIVE Sensitive     OXACILLIN >=4 RESISTANT Resistant     TETRACYCLINE 2 SENSITIVE Sensitive     VANCOMYCIN 1 SENSITIVE Sensitive     TRIMETH/SULFA <=10 SENSITIVE Sensitive     CLINDAMYCIN >=8 RESISTANT Resistant     RIFAMPIN <=0.5 SENSITIVE Sensitive     Inducible Clindamycin NEGATIVE Sensitive     * FEW STAPHYLOCOCCUS EPIDERMIDIS    Coagulation Studies: No results for input(s): LABPROT, INR in the last 72 hours.   Urinalysis: No results for input(s): COLORURINE, LABSPEC, PHURINE, GLUCOSEU, HGBUR, BILIRUBINUR, KETONESUR, PROTEINUR, UROBILINOGEN, NITRITE, LEUKOCYTESUR in the last 72 hours.  Invalid input(s): APPERANCEUR     Imaging: No results found.   Medications:      vitamin C  500 mg Oral BID   atorvastatin  40 mg Oral Daily   cephALEXin  250 mg Oral Q8H   fluticasone  2 spray Each Nare Daily   furosemide  40 mg Oral BID   gabapentin  100 mg Oral TID   insulin aspart  0-5 Units Subcutaneous QHS   insulin aspart  0-9 Units Subcutaneous TID WC   insulin aspart  7 Units Subcutaneous TID WC   insulin glargine-yfgn  25 Units Subcutaneous BID   loratadine  10 mg Oral Daily   metoprolol succinate  50 mg Oral BID   multivitamin with minerals  1 tablet Oral Daily   nutrition supplement (JUVEN)  1 packet Oral BID BM   Ensure Max  Protein  11 oz Oral QHS   sodium bicarbonate  1,300 mg Oral BID   zinc sulfate  220 mg Oral Daily   hydrALAZINE, morphine injection, ondansetron **OR** ondansetron (ZOFRAN) IV, oxyCODONE  Assessment/ Plan:  [name] is a 66 y.o.  female with past medical history of hypertension, hyperlipidemia, diabetes, and chronic kidney disease stage V. Patient presents to ed for abnormal labs. She has been admitted for Hyponatremia [E87.1] AKI (acute kidney injury) ([place]) [N17.9] Symptomatic anemia [D64.9] Anemia, unspecified type [D64.9] Severe sepsis ([place]) [A41.9, R65.20]   Acute Kidney Injury with hyponatremia on chronic kidney disease stage V with baseline creatinine 3.91 and GFR of 12 on 12/23/21.  Acute kidney injury secondary to underlying illness Chronic kidney disease is secondary to diabetes and hypertension No exposure to IV contrast.  CT abdomen pelvis negative for obstruction, several renal stones identified.  Losartan and furosemide  held since admission.    Creatinine unchanged today. Continued to have good urine output.  Patient now displaying uremic symptoms of altered mental status, drowsiness and muscle twitching. Attempted to explain to patient that dialysis may be required during this admission.  Patient was fixated on primary care physician and stated she did not require dialysis.  We will continue to monitor labs in a.m. and determine need for dialysis.  If dialysis required, will consult vascular for placement of permacath and initiate dialysis afterwards.  Lab Results  Component Value Date   CREATININE 4.17 (H) 01/07/2022   CREATININE 4.18 (H) 01/06/2022   CREATININE 3.93 (H) 01/05/2022    Intake/Output Summary (Last 24 hours) at 01/07/2022 1146 Last data filed at 01/07/2022 1018 Gross per 24 hour  Intake 420 ml  Output 1100 ml  Net -680 ml    2. Anemia of chronic kidney disease Lab Results  Component Value Date   HGB 9.1 (L) 01/07/2022  Hemoglobin 9.1 today   3. Secondary Hyperparathyroidism:  Lab Results  Component Value Date   CALCIUM 10.2 01/07/2022   CAION 1.11 (L) 01/01/2022  Calcium remains at goal.  4.  Acute metabolic acidosis.  Currently receiving sodium  bicarb 1300 mg twice daily.  5.  Generalized edema.  Improved.  Continue oral diuretics   LOS: 6   6/7/202311:46 AM

## 2022-01-07 NOTE — Progress Notes (Deleted)
    GYNECOLOGY PROGRESS NOTE  Subjective:    Patient ID: Theresa Warren, female    DOB: 26-Aug-1955, 66 y.o.   MRN: [id number]  HPI  Patient is a 66 y.o. No obstetric history on file. female who presents for hospital follow up. She was evaluated in the ED on 12/31/2021. A CT Scan was obtained and it showed the following:  Reproductive: Abnormal low-density endometrial thickening for age  (series 2, image 65 and sagittal image 54). Small calcified fundal  fibroid. Ovaries are within normal limits.   IMPRESSION:  1. Abnormal low-density endometrial thickening for a postmenopausal  patient. Endometrial sampling is indicated to exclude carcinoma. If  results are benign, sonohysterogram should be considered for focal  lesion work-up. (Ref: Radiological Reasoning: Algorithmic Workup of  Abnormal Vaginal Bleeding with Endovaginal Sonography and  Sonohysterography. AJR 2008; 482:L07-86).   The following portions of the patient's history were reviewed and updated as appropriate: allergies, current medications, past family history, past medical history, past social history, past surgical history, and problem list.  Review of Systems Pertinent items noted in HPI and remainder of comprehensive ROS otherwise negative.   Objective:   There were no vitals taken for this visit. There is no height or weight on file to calculate BMI. General appearance: alert, cooperative, and no distress Abdomen: {abdominal exam:16834} Pelvic: {pelvic exam:16852::"cervix normal in appearance","external genitalia normal","no adnexal masses or tenderness","no cervical motion tenderness","rectovaginal septum normal","uterus normal size, shape, and consistency","vagina normal without discharge"} Extremities: {extremity exam:5109} Neurologic: {neuro exam:17854}   Assessment:   No diagnosis found.   Plan:   There are no diagnoses linked to this encounter.   Rubie Maid, MD [facility]

## 2022-01-07 NOTE — Consult Note (Addendum)
[facility] Nurse Consult Note: Reason for Consult: Vac dressing change performed with assistance of the bedside nurse. Pt was medicated for pain prior to the procedure and tolerated with mod amt discomfort.   Wound type: 3 Stage 4 pressure injuries; 1 to left buttock, 1 to sacrum and one is to left hip.  There is moist yellow slough surrounding the wounds, which are located in the gluteal crease and it is difficult to maintain a seal.  Wounds are pale yellow with small amt pink drainage.  Left hip Stage 4: 6X8X1cm with 1 cm undermining Left buttock 7X3X4cm Sacrum 5X9X4cm Applied 2 barrier rings surrounding to buttock wounds to attempt to maintain a seal, then one piece black foam to each wound and bridged together and track pad placed to left thigh.  Pressure Injury POA: Yes Dressing procedure/placement/frequency: [place] team will plan to change the dressings again on Fri. Julien Girt MSN, RN, [place], [place], [place]

## 2022-01-08 ENCOUNTER — Encounter: Payer: Medicare Other | Admitting: Obstetrics and Gynecology

## 2022-01-08 DIAGNOSIS — A419 Sepsis, unspecified organism: Secondary | ICD-10-CM | POA: Diagnosis not present

## 2022-01-08 DIAGNOSIS — R6520 Severe sepsis without septic shock: Secondary | ICD-10-CM | POA: Diagnosis not present

## 2022-01-08 LAB — GLUCOSE, CAPILLARY
Glucose-Capillary: 139 mg/dL — ABNORMAL HIGH (ref 70–99)
Glucose-Capillary: 129 mg/dL — ABNORMAL HIGH (ref 70–99)
Glucose-Capillary: 110 mg/dL — ABNORMAL HIGH (ref 70–99)
Glucose-Capillary: 139 mg/dL — ABNORMAL HIGH (ref 70–99)

## 2022-01-08 LAB — CBC
HCT: 26.1 % — ABNORMAL LOW (ref 36.0–46.0)
Hemoglobin: 8.4 g/dL — ABNORMAL LOW (ref 12.0–15.0)
MCH: 30.2 pg (ref 26.0–34.0)
MCHC: 32.2 g/dL (ref 30.0–36.0)
MCV: 93.9 fL (ref 80.0–100.0)
Platelets: 445 10*3/uL — ABNORMAL HIGH (ref 150–400)
RBC: 2.78 MIL/uL — ABNORMAL LOW (ref 3.87–5.11)
RDW: 14.9 % (ref 11.5–15.5)
WBC: 11.6 10*3/uL — ABNORMAL HIGH (ref 4.0–10.5)
nRBC: 0 % (ref 0.0–0.2)

## 2022-01-08 LAB — RENAL FUNCTION PANEL
Albumin: 2.9 g/dL — ABNORMAL LOW (ref 3.5–5.0)
Anion gap: 12 (ref 5–15)
BUN: 128 mg/dL — ABNORMAL HIGH (ref 8–23)
CO2: 27 mmol/L (ref 22–32)
Calcium: 10.1 mg/dL (ref 8.9–10.3)
Chloride: 95 mmol/L — ABNORMAL LOW (ref 98–111)
Creatinine, Ser: 4.36 mg/dL — ABNORMAL HIGH (ref 0.44–1.00)
GFR, Estimated: 11 mL/min — ABNORMAL LOW (ref >60)
Glucose, Bld: 145 mg/dL — ABNORMAL HIGH (ref 70–99)
Phosphorus: 4.1 mg/dL (ref 2.5–4.6)
Potassium: 4.9 mmol/L (ref 3.5–5.1)
Sodium: 134 mmol/L — ABNORMAL LOW (ref 135–145)

## 2022-01-08 LAB — HEPATITIS B SURFACE ANTIGEN: Hepatitis B Surface Ag: NONREACTIVE

## 2022-01-08 LAB — HEPATITIS B CORE ANTIBODY, TOTAL: Hep B Core Total Ab: NONREACTIVE

## 2022-01-08 MED ORDER — CHLORHEXIDINE GLUCONATE CLOTH 2 % EX PADS
6.0000 | MEDICATED_PAD | Freq: Every day | CUTANEOUS | Status: DC
  Administered 2022-01-09 – 2022-02-10 (×22): 6 via TOPICAL

## 2022-01-08 NOTE — TOC Progression Note (Signed)
Transition of Care [facility]) - Progression Note    Patient Details  Name: Theresa Warren MRN: [id number] Date of Birth: 06-05-1956  Transition of Care [facility]) CM/SW [place], RN Phone Number: 01/08/2022, 3:00 PM  Clinical Narrative:   As per hospitalist, patient would like to see SNF choices prior to agreeing to transfer.  SNF workup started.  TOC to follow.    Expected Discharge Plan:  (TBD) Barriers to Discharge: Continued Medical Work up  Expected Discharge Plan and Services Expected Discharge Plan:  (TBD)   Discharge Planning Services: CM Consult Post Acute Care Choice:  (TBD) Living arrangements for the past 2 months: Single Family Home                                       Social Determinants of Health (SDOH) Interventions    Readmission Risk Interventions     No data to display

## 2022-01-08 NOTE — Progress Notes (Signed)
PROGRESS NOTE    Theresa Warren  PYP:[id number] DOB: 01/15/1956 DOA: 12/31/2021 PCP: Crist Infante, MD    Brief Narrative:  [name] is a 66 year old female hyperlipidemia, hypertension, CKD stage V, insulin-dependent diabetes mellitus, who presents emergency department from [facility] for evaluation of low sodium noted on labs at the facility.  She was discharged to rehab on 5/23 after being admitted following a fall and also with acute on chronic anemia.   ED Course - temp 99.6, HR 107, otherwise unremarkable vitals. Labs with sodium 129, potassium 5.1, chloride 99, bicarb 17, BUN of 100, serum creatinine of 4.61, GFR of 10, WBC 18.4, hemoglobin 6.9, platelets of 473.  LFT's were also elevated.   Patient met criteria for sepsis on arrival, treated per protocol in the ED with IV Cefepime, Flagyl, Vanc, and LR 1 L bolus fluids.  Infection source unclear but felt possibly due to sacral wound infection and concern for underlying osteomyelitis.   Admitted to the hospitalist service for further evaluation and management.  Continued on IV Cefepime and Vanc.   General surgery was consulted for debridement on sacral pressure injury.  Nephrology consulted due to worsening renal function  6/7 complaining of pain where the wound VAC is after being changed today no other complaints. 6/8 discussed with pt about SNF as she lives alone and is weak.she will reconsider it. Messaged case mx to discuss options.  Consultants:  Surgery  Procedures:   Antimicrobials:  Keflex   Subjective: No sob, cp, or dizziness  Objective: Vitals:   01/08/22 0041 01/08/22 0445 01/08/22 0449 01/08/22 0752  BP: (!) 131/51  139/62 (!) 143/60  Pulse: 85  90 93  Resp: 17  16 18   Temp: 98 F (36.7 C)  98 F (36.7 C) 98.6 F (37 C)  TempSrc:      SpO2: (!) 80%  93% (!) 82%  Weight:  106.3 kg    Height:        Intake/Output Summary (Last 24 hours) at 01/08/2022 0809 Last data filed at  01/08/2022 0446 Gross per 24 hour  Intake 180 ml  Output 1600 ml  Net -1420 ml   Filed Weights   01/06/22 0500 01/07/22 0527 01/08/22 0445  Weight: 104.3 kg 101.7 kg 106.3 kg    Examination: Calm, NAD Cta no w/r Reg s1/s2 no gallop Soft benign +bs +b/l LE edema Aaoxox3  Mood and affect appropriate in current setting     Data Reviewed: I have personally reviewed following labs and imaging studies  CBC: Recent Labs  Lab 01/03/22 0613 01/04/22 0618 01/05/22 0512 01/06/22 0532 01/07/22 0629  WBC 13.0* 11.5* 10.5 12.2* 10.4  HGB 8.0* 7.9* 8.4* 8.3* 9.1*  HCT 24.6* 23.9* 26.4* 25.7* 28.5*  MCV 92.5 91.2 92.3 92.8 93.4  PLT 360 347 393 395 245*   Basic Metabolic Panel: Recent Labs  Lab 01/02/22 0703 01/03/22 0613 01/04/22 0618 01/05/22 0512 01/06/22 0532 01/07/22 0629  NA 136 135 133* 134* 134* 132*  K 4.7 4.6 4.4 4.8 4.7 4.9  CL 108 105 105 102 99 96*  CO2 17* 19* 18* 20* 23 24  GLUCOSE 100* 92 244* 193* 214* 215*  BUN 89* 94* 99* 98* 121* 120*  CREATININE 4.12* 3.88* 3.98* 3.93* 4.18* 4.17*  CALCIUM 8.6* 8.6* 9.1 10.0 10.3 10.2  MG 1.7 1.6* 2.1 2.0  --  1.8   GFR: Estimated Creatinine Clearance: 15.9 mL/min (A) (by C-G formula based on SCr of 4.17 mg/dL (H)). Liver  Function Tests: Recent Labs  Lab 01/02/22 0703 01/03/22 0613 01/05/22 0512 01/06/22 0532  AST 65* 184* 125* 128*  ALT 67* 93* 76* 75*  ALKPHOS 175* 304* 286* 257*  BILITOT 0.9 0.6 0.5 0.6  PROT 6.5 5.8* 6.8 7.4  ALBUMIN 2.0* 1.9* 3.0* 3.3*   No results for input(s): "LIPASE", "AMYLASE" in the last 168 hours. No results for input(s): "AMMONIA" in the last 168 hours. Coagulation Profile: No results for input(s): "INR", "PROTIME" in the last 168 hours.  Cardiac Enzymes: No results for input(s): "CKTOTAL", "CKMB", "CKMBINDEX", "TROPONINI" in the last 168 hours. BNP (last 3 results) No results for input(s): "PROBNP" in the last 8760 hours. HbA1C: No results for input(s): "HGBA1C" in  the last 72 hours. CBG: Recent Labs  Lab 01/07/22 0749 01/07/22 1142 01/07/22 1609 01/07/22 2049 01/08/22 0752  GLUCAP 222* 256* 180* 154* 139*   Lipid Profile: No results for input(s): "CHOL", "HDL", "LDLCALC", "TRIG", "CHOLHDL", "LDLDIRECT" in the last 72 hours. Thyroid Function Tests: No results for input(s): "TSH", "T4TOTAL", "FREET4", "T3FREE", "THYROIDAB" in the last 72 hours. Anemia Panel: No results for input(s): "VITAMINB12", "FOLATE", "FERRITIN", "TIBC", "IRON", "RETICCTPCT" in the last 72 hours. Sepsis Labs: No results for input(s): "PROCALCITON", "LATICACIDVEN" in the last 168 hours.   Recent Results (from the past 240 hour(s))  Blood culture (routine x 2)     Status: None   Collection Time: 12/31/21 11:35 PM   Specimen: BLOOD  Result Value Ref Range Status   Specimen Description BLOOD RIGHT ARM  Final   Special Requests   Final    BOTTLES DRAWN AEROBIC AND ANAEROBIC Blood Culture adequate volume   Culture   Final    NO GROWTH 5 DAYS Performed at [facility], [address], [place], [place] [address]    Report Status 01/06/2022 FINAL  Final  Blood culture (routine x 2)     Status: None   Collection Time: 12/31/21 11:49 PM   Specimen: BLOOD  Result Value Ref Range Status   Specimen Description BLOOD RIGHT HAND  Final   Special Requests   Final    BOTTLES DRAWN AEROBIC AND ANAEROBIC Blood Culture adequate volume   Culture   Final    NO GROWTH 5 DAYS Performed at [facility], [address] [place], [place] [address]    Report Status 01/06/2022 FINAL  Final  MRSA Next Gen by PCR, Nasal     Status: None   Collection Time: 01/01/22 10:17 AM   Specimen: Nasal Mucosa; Nasal Swab  Result Value Ref Range Status   MRSA by PCR Next Gen NOT DETECTED NOT DETECTED Final    Comment: (NOTE) The GeneXpert MRSA Assay (FDA approved for NASAL specimens only), is one component of a comprehensive MRSA colonization surveillance program. It is not  intended to diagnose MRSA infection nor to guide or monitor treatment for MRSA infections. Test performance is not FDA approved in patients less than 67 years old. Performed at [facility], [place], [place] [address]   Aerobic/Anaerobic Culture w Gram Stain (surgical/deep wound)     Status: None   Collection Time: 01/01/22  3:37 PM   Specimen: PATH Soft tissue  Result Value Ref Range Status   Specimen Description TISSUE  Final   Special Requests BUTTOCKS WOUND  Final   Gram Stain   Final    NO SQUAMOUS EPITHELIAL CELLS SEEN FEW WBC SEEN RARE GRAM POSITIVE RODS FEW GRAM POSITIVE COCCI    Culture  Final    FEW STAPHYLOCOCCUS AUREUS RARE ENTEROCOCCUS FAECALIS NO ANAEROBES ISOLATED Performed at [facility], [place] [address] [place], [place] [address]    Report Status 01/06/2022 FINAL  Final   Organism ID, Bacteria STAPHYLOCOCCUS AUREUS  Final   Organism ID, Bacteria ENTEROCOCCUS FAECALIS  Final      Susceptibility   Enterococcus faecalis - MIC*    AMPICILLIN <=2 SENSITIVE Sensitive     VANCOMYCIN 1 SENSITIVE Sensitive     GENTAMICIN SYNERGY SENSITIVE Sensitive     * RARE ENTEROCOCCUS FAECALIS   Staphylococcus aureus - MIC*    CIPROFLOXACIN >=8 RESISTANT Resistant     ERYTHROMYCIN >=8 RESISTANT Resistant     GENTAMICIN <=0.5 SENSITIVE Sensitive     OXACILLIN <=0.25 SENSITIVE Sensitive     TETRACYCLINE <=1 SENSITIVE Sensitive     VANCOMYCIN 1 SENSITIVE Sensitive     TRIMETH/SULFA <=10 SENSITIVE Sensitive     CLINDAMYCIN >=8 RESISTANT Resistant     RIFAMPIN <=0.5 SENSITIVE Sensitive     Inducible Clindamycin NEGATIVE Sensitive     * FEW STAPHYLOCOCCUS AUREUS  Aerobic/Anaerobic Culture w Gram Stain (surgical/deep wound)     Status: None   Collection Time: 01/01/22  3:46 PM   Specimen: PATH Soft tissue  Result Value Ref Range Status   Specimen Description TISSUE  Final   Special Requests BUTTOCKS WOUND NO 2  Final   Gram Stain   Final    NO  SQUAMOUS EPITHELIAL CELLS SEEN FEW WBC SEEN FEW GRAM POSITIVE COCCI    Culture   Final    MODERATE STAPHYLOCOCCUS AUREUS FEW STAPHYLOCOCCUS EPIDERMIDIS NO ANAEROBES ISOLATED Performed at [facility], [address] [place], [place] [address]    Report Status 01/06/2022 FINAL  Final   Organism ID, Bacteria STAPHYLOCOCCUS AUREUS  Final   Organism ID, Bacteria STAPHYLOCOCCUS EPIDERMIDIS  Final      Susceptibility   Staphylococcus aureus - MIC*    CIPROFLOXACIN >=8 RESISTANT Resistant     ERYTHROMYCIN >=8 RESISTANT Resistant     GENTAMICIN <=0.5 SENSITIVE Sensitive     OXACILLIN <=0.25 SENSITIVE Sensitive     TETRACYCLINE <=1 SENSITIVE Sensitive     VANCOMYCIN 1 SENSITIVE Sensitive     TRIMETH/SULFA <=10 SENSITIVE Sensitive     CLINDAMYCIN >=8 RESISTANT Resistant     RIFAMPIN <=0.5 SENSITIVE Sensitive     Inducible Clindamycin NEGATIVE Sensitive     * MODERATE STAPHYLOCOCCUS AUREUS   Staphylococcus epidermidis - MIC*    CIPROFLOXACIN 4 RESISTANT Resistant     ERYTHROMYCIN >=8 RESISTANT Resistant     GENTAMICIN <=0.5 SENSITIVE Sensitive     OXACILLIN >=4 RESISTANT Resistant     TETRACYCLINE 2 SENSITIVE Sensitive     VANCOMYCIN 1 SENSITIVE Sensitive     TRIMETH/SULFA <=10 SENSITIVE Sensitive     CLINDAMYCIN >=8 RESISTANT Resistant     RIFAMPIN <=0.5 SENSITIVE Sensitive     Inducible Clindamycin NEGATIVE Sensitive     * FEW STAPHYLOCOCCUS EPIDERMIDIS         Radiology Studies: No results found.      Scheduled Meds:  vitamin C  500 mg Oral BID   atorvastatin  40 mg Oral Daily   cephALEXin  250 mg Oral Q8H   fluticasone  2 spray Each Nare Daily   furosemide  40 mg Oral BID   gabapentin  100 mg Oral TID   insulin aspart  0-5 Units Subcutaneous QHS   insulin aspart  0-9 Units Subcutaneous  TID WC   insulin aspart  7 Units Subcutaneous TID WC   insulin glargine-yfgn  25 Units Subcutaneous BID   loratadine  10 mg Oral Daily   metoprolol succinate  50 mg Oral  BID   multivitamin with minerals  1 tablet Oral Daily   nutrition supplement (JUVEN)  1 packet Oral BID BM   Ensure Max Protein  11 oz Oral QHS   sodium bicarbonate  1,300 mg Oral BID   zinc sulfate  220 mg Oral Daily   Continuous Infusions:  Assessment & Plan:   Principal Problem:   Severe sepsis ([place]) Active Problems:   Chronic kidney disease, stage 5 ([place])   Essential hypertension   DNR (do not resuscitate)/DNI(Do Not Intubate)   Type 2 diabetes mellitus with chronic kidney disease, with long-term current use of insulin (HCC)   Sacral wound, initial encounter   Swelling of left lower extremity   Abdominal pain   AKI (acute kidney injury) ([place])   Hypomagnesemia   Symptomatic anemia   Hyponatremia   Premature atrial complexes   OSA (obstructive sleep apnea)   Pressure injury of skin   Elevated LFTs   Anasarca associated with disorder of kidney    * Severe sepsis (HCC) POA as evidenced by tachycardia, leukocytosis, AKI and transaminitis consistent with organ dysfunction and severe sepsis with suspected infection source being sacral wounds versus an abdominal source (CT abdomen pelvis negative for anything acute however).  MRSA PCR negative. Wound cultures grew few Staph aureus, rare Enterococcus faecalis. Initially treated with vancomycin/cefepime. Vanc stopped 6/4. 6/8 patient was started on Keflex.  Plan is to treat MSSA in both cultures.  Enterococcus likely contaminated from stool d/T location of wounds and MRSE likely colonization of skin       Type 2 diabetes mellitus with chronic kidney disease, with long-term current use of insulin (HCC) BG better controlled Continue current regimen R-ISS    Essential hypertension Losartan held due to AKI. 6/8 stable  On p.o. Lasix started by nephrology  IV hydralazine as needed      AKI on Chronic kidney disease, stage 5 ([place]) Hyponatremia Treated IV Lasix and albumin per nephrology.  6/7 bilateral lower extremity  improving AKI secondary to underlying illness  CKD secondary to diabetes and hypertension  No exposure to IV contrast  CT abdomen pelvis negative for obstruction, several renal stones identified.   Losartan held due to AKI  Now switched to Lasix p.o. 40 mg twice daily  6/8 albumin was  BUN 128  Avoid nephrotoxic meds  Sodium level improved      sacral wound, initial encounter See pressure injury    Abdominal pain Patient reported on admission.  CT of abdomen pelvis showed abnormal endometrial thickening for a postmenopausal patient, bladder distention estimated 520 mL, no other acute or inflammatory processes or acute findings. --Monitor clinically for now -- Needs OB/GYN follow-up for endometrial sampling to exclude endometrial carcinoma   Swelling of left lower extremity With redness, patient attributes it to clindamycin.  She reported on admission that this has been present for over one month.   Left lower extremity Doppler US on 12/18/21 was negative for DVT.       Symptomatic anemia Hemoglobin on admission was 6.9 >> 6.3, down from 7.6 at time of discharge on 5/23.  No signs of active bleeding, did have vaginal/endometrial bleeding previous admission but no reports of this recently.  May have slow chronic blood loss from sacral wounds.  Has baseline  anemia of chronic disease due to renal failure. Received 1 unit PRBC transfusion on admission. -- Transfusing another unit RBCs today -- Trend hemoglobin and transfuse if less than 7   Hypomagnesemia Resolved with replacement on 6/3. Monitor and replace Mg as needed.     Premature atrial complexes Patient became significantly tachycardic on 6/2 with heart rates sustaining in the 130s.  Cardiology reviewed telemetry and feels patient dysrhythmias were not A-fib but sinus tach with PACs. --Cardiology following -- Continue Lopressor 25 mg BID -- Continue monitoring telemetry    Elevated LFTs On admission, alk phos 212,  AST 182, ALT 107.  Unclear etiology, repeat LFTs not available today.  Appears LFTs were elevated at time of discharge on 5/23 but have increased since. RUQ ultrasound unremarkable. Acute hepatitis panel negative.  Pressure injury of skin Present on admission.  Patient has multiple sacral pressure injuries.  Some concern at time of admission that these were the source of sepsis. General surgery consulted and took patient to the OR on 6/1 for debridement.   Now with wound VAC. --Initial wound VAC change Monday 6/5 then every M/W/F -- Wound care consulted -- Frequent repositioning --Follow general surgery recommendations --Pain control --Follow blood cultures Pressure Injury 01/01/22 Buttocks Left Stage 3 -  Full thickness tissue loss. Subcutaneous fat may be visible but bone, tendon or muscle are NOT exposed. (Active)  01/01/22 0222  Location: Buttocks  Location Orientation: Left  Staging: Stage 3 -  Full thickness tissue loss. Subcutaneous fat may be visible but bone, tendon or muscle are NOT exposed.  Wound Description (Comments):   Present on Admission: Yes     Pressure Injury 01/01/22 Buttocks Right Stage 2 -  Partial thickness loss of dermis presenting as a shallow open injury with a red, pink wound bed without slough. (Active)  01/01/22 0223  Location: Buttocks  Location Orientation: Right  Staging: Stage 2 -  Partial thickness loss of dermis presenting as a shallow open injury with a red, pink wound bed without slough.  Wound Description (Comments):   Present on Admission: Yes     Pressure Injury 01/01/22 Hip Left Stage 2 -  Partial thickness loss of dermis presenting as a shallow open injury with a red, pink wound bed without slough. (Active)  01/01/22 0225  Location: Hip  Location Orientation: Left  Staging: Stage 2 -  Partial thickness loss of dermis presenting as a shallow open injury with a red, pink wound bed without slough.  Wound Description (Comments):   Present on  Admission: Yes     Pressure Injury 01/01/22 Hip Right Stage 3 -  Full thickness tissue loss. Subcutaneous fat may be visible but bone, tendon or muscle are NOT exposed. (Active)  01/01/22 0225  Location: Hip  Location Orientation: Right  Staging: Stage 3 -  Full thickness tissue loss. Subcutaneous fat may be visible but bone, tendon or muscle are NOT exposed.  Wound Description (Comments):   Present on Admission: Yes          OSA (obstructive sleep apnea) .  Appears not to be on CPAP       DVT prophylaxis: SCD Code Status: DNR Family Communication: None at bedside Disposition Plan:  Status is: Inpatient Remains inpatient appropriate because: renal function worsening, needs to be cleared by surgery and nephrology. Also unsafe discharge, reconsidering snf.       LOS: 7 days   Time spent 35 minutes with more than 50% on COC    Kenyatte Chatmon  Kurtis Bushman, MD [facility] Pager [phone]  If 7PM-7AM, please contact night-coverage 01/08/2022, 8:09 AM

## 2022-01-08 NOTE — Progress Notes (Signed)
PT Cancellation Note  Patient Details Name: Theresa Warren MRN: [id number] DOB: February 18, 1956   Cancelled Treatment:    Reason Eval/Treat Not Completed: Patient declined, no reason specified. Pt received upright in bed sleeping at breakfast tray. Awakens to voice but declining participation at this time in order to finish her breakfast. PT to re-attempt at alter time/date.   Salem Caster. Fairly IV, PT, DPT Physical Therapist- [facility]  01/08/2022, 10:55 AM

## 2022-01-08 NOTE — Progress Notes (Signed)
[facility]  ROUNDING NOTE   Subjective:   Theresa Warren is a 66 y.o. female with past medical history of hypertension, hyperlipidemia, diabetes, and chronic kidney disease stage V. Patient presents to ed for abnormal labs. She has been admitted for Hyponatremia [E87.1] AKI (acute kidney injury) ([place]) [N17.9] Symptomatic anemia [D64.9] Anemia, unspecified type [D64.9] Severe sepsis ([place]) [A41.9, R65.20]  Patient is known to our clinic from previous admissions. She is followed by Dr Arty Baumgartner in [place] at [facility]. She states she recently was seen in office and advised to come to hospital for abnormal sodium labs. Patient states she was at [facility] receiving rehab, able to ambulate with assistance. She denies weakness, loss of appetite, or nausea and vomiting. Reports pain from sacral wound. Denies shortness of breath. States nephrology was preparing for vein mapping to prepare for dialysis in the future. She continues to make urine.    Update:  Patient seen sitting up in bed, ill-appearing Breakfast tray at bedside, untouched Patient states the smell of eggs makes her nauseous Bilateral upper extremity jerking remains   Objective:  Vital signs in last 24 hours:  Temp:  [98 F (36.7 C)-100.5 F (38.1 C)] 98.2 F (36.8 C) (06/08 1138) Pulse Rate:  [82-100] 82 (06/08 1138) Resp:  [16-20] 18 (06/08 1138) BP: (131-159)/(51-77) 145/77 (06/08 1138) SpO2:  [80 %-95 %] 85 % (06/08 1138) Weight:  [106.3 kg] 106.3 kg (06/08 0445)  Weight change: 4.6 kg Filed Weights   01/06/22 0500 01/07/22 0527 01/08/22 0445  Weight: 104.3 kg 101.7 kg 106.3 kg    Intake/Output: I/O last 3 completed shifts: In: 180 [P.O.:180] Out: 2700 [Urine:2700]   Intake/Output this shift:  No intake/output data recorded.  Physical Exam: General: Chronically ill-appearing  Head: Normocephalic, atraumatic. Moist oral mucosal membranes  Eyes: Anicteric  Lungs:  Clear  to auscultation, normal effort, room air  Heart: Regular rhythm, tachycardia  Abdomen:  Soft, nontender, obese  Extremities: 1+ peripheral edema.  Neurologic: Oriented to self, bilateral upper extremity myoclonus  Skin: No acute rash, sacral wound with wound vac  Access: None    Basic Metabolic Panel: Recent Labs  Lab 01/02/22 0703 01/03/22 4196 01/04/22 0618 01/05/22 0512 01/06/22 0532 01/07/22 0629 01/08/22 0941  NA 136 135 133* 134* 134* 132* 134*  K 4.7 4.6 4.4 4.8 4.7 4.9 4.9  CL 108 105 105 102 99 96* 95*  CO2 17* 19* 18* 20* 23 24 27   GLUCOSE 100* 92 244* 193* 214* 215* 145*  BUN 89* 94* 99* 98* 121* 120* 128*  CREATININE 4.12* 3.88* 3.98* 3.93* 4.18* 4.17* 4.36*  CALCIUM 8.6* 8.6* 9.1 10.0 10.3 10.2 10.1  MG 1.7 1.6* 2.1 2.0  --  1.8  --   PHOS  --   --   --   --   --   --  4.1     Liver Function Tests: Recent Labs  Lab 01/02/22 0703 01/03/22 0613 01/05/22 0512 01/06/22 0532 01/08/22 0941  AST 65* 184* 125* 128*  --   ALT 67* 93* 76* 75*  --   ALKPHOS 175* 304* 286* 257*  --   BILITOT 0.9 0.6 0.5 0.6  --   PROT 6.5 5.8* 6.8 7.4  --   ALBUMIN 2.0* 1.9* 3.0* 3.3* 2.9*    No results for input(s): "LIPASE", "AMYLASE" in the last 168 hours. No results for input(s): "AMMONIA" in the last 168 hours.  CBC: Recent Labs  Lab 01/04/22 0618 01/05/22 0512 01/06/22  0532 01/07/22 0629 01/08/22 0941  WBC 11.5* 10.5 12.2* 10.4 11.6*  HGB 7.9* 8.4* 8.3* 9.1* 8.4*  HCT 23.9* 26.4* 25.7* 28.5* 26.1*  MCV 91.2 92.3 92.8 93.4 93.9  PLT 347 393 395 435* 445*     Cardiac Enzymes: No results for input(s): "CKTOTAL", "CKMB", "CKMBINDEX", "TROPONINI" in the last 168 hours.  BNP: Invalid input(s): "POCBNP"  CBG: Recent Labs  Lab 01/07/22 1142 01/07/22 1609 01/07/22 2049 01/08/22 0752 01/08/22 1139  GLUCAP 256* 180* 154* 139* 129*     Microbiology: Results for orders placed or performed during the hospital encounter of 12/31/21  Blood culture (routine  x 2)     Status: None   Collection Time: 12/31/21 11:35 PM   Specimen: BLOOD  Result Value Ref Range Status   Specimen Description BLOOD RIGHT ARM  Final   Special Requests   Final    BOTTLES DRAWN AEROBIC AND ANAEROBIC Blood Culture adequate volume   Culture   Final    NO GROWTH 5 DAYS Performed at [facility], [address], [place], [place] [address]    Report Status 01/06/2022 FINAL  Final  Blood culture (routine x 2)     Status: None   Collection Time: 12/31/21 11:49 PM   Specimen: BLOOD  Result Value Ref Range Status   Specimen Description BLOOD RIGHT HAND  Final   Special Requests   Final    BOTTLES DRAWN AEROBIC AND ANAEROBIC Blood Culture adequate volume   Culture   Final    NO GROWTH 5 DAYS Performed at [facility], [address], [place], [place] [address]    Report Status 01/06/2022 FINAL  Final  MRSA Next Gen by PCR, Nasal     Status: None   Collection Time: 01/01/22 10:17 AM   Specimen: Nasal Mucosa; Nasal Swab  Result Value Ref Range Status   MRSA by PCR Next Gen NOT DETECTED NOT DETECTED Final    Comment: (NOTE) The GeneXpert MRSA Assay (FDA approved for NASAL specimens only), is one component of a comprehensive MRSA colonization surveillance program. It is not intended to diagnose MRSA infection nor to guide or monitor treatment for MRSA infections. Test performance is not FDA approved in patients less than 8 years old. Performed at [facility], [place], [place] [address]   Aerobic/Anaerobic Culture w Gram Stain (surgical/deep wound)     Status: None   Collection Time: 01/01/22  3:37 PM   Specimen: PATH Soft tissue  Result Value Ref Range Status   Specimen Description TISSUE  Final   Special Requests BUTTOCKS WOUND  Final   Gram Stain   Final    NO SQUAMOUS EPITHELIAL CELLS SEEN FEW WBC SEEN RARE GRAM POSITIVE RODS FEW GRAM POSITIVE COCCI    Culture   Final    FEW STAPHYLOCOCCUS AUREUS RARE  ENTEROCOCCUS FAECALIS NO ANAEROBES ISOLATED Performed at [facility], [address], [place], [place] [address]    Report Status 01/06/2022 FINAL  Final   Organism ID, Bacteria STAPHYLOCOCCUS AUREUS  Final   Organism ID, Bacteria ENTEROCOCCUS FAECALIS  Final      Susceptibility   Enterococcus faecalis - MIC*    AMPICILLIN <=2 SENSITIVE Sensitive     VANCOMYCIN 1 SENSITIVE Sensitive     GENTAMICIN SYNERGY SENSITIVE Sensitive     * RARE ENTEROCOCCUS FAECALIS   Staphylococcus aureus - MIC*    CIPROFLOXACIN >=8 RESISTANT Resistant     ERYTHROMYCIN >=8 RESISTANT Resistant  GENTAMICIN <=0.5 SENSITIVE Sensitive     OXACILLIN <=0.25 SENSITIVE Sensitive     TETRACYCLINE <=1 SENSITIVE Sensitive     VANCOMYCIN 1 SENSITIVE Sensitive     TRIMETH/SULFA <=10 SENSITIVE Sensitive     CLINDAMYCIN >=8 RESISTANT Resistant     RIFAMPIN <=0.5 SENSITIVE Sensitive     Inducible Clindamycin NEGATIVE Sensitive     * FEW STAPHYLOCOCCUS AUREUS  Aerobic/Anaerobic Culture w Gram Stain (surgical/deep wound)     Status: None   Collection Time: 01/01/22  3:46 PM   Specimen: PATH Soft tissue  Result Value Ref Range Status   Specimen Description TISSUE  Final   Special Requests BUTTOCKS WOUND NO 2  Final   Gram Stain   Final    NO SQUAMOUS EPITHELIAL CELLS SEEN FEW WBC SEEN FEW GRAM POSITIVE COCCI    Culture   Final    MODERATE STAPHYLOCOCCUS AUREUS FEW STAPHYLOCOCCUS EPIDERMIDIS NO ANAEROBES ISOLATED Performed at [facility], [address], [place], [place] [address]    Report Status 01/06/2022 FINAL  Final   Organism ID, Bacteria STAPHYLOCOCCUS AUREUS  Final   Organism ID, Bacteria STAPHYLOCOCCUS EPIDERMIDIS  Final      Susceptibility   Staphylococcus aureus - MIC*    CIPROFLOXACIN >=8 RESISTANT Resistant     ERYTHROMYCIN >=8 RESISTANT Resistant     GENTAMICIN <=0.5 SENSITIVE Sensitive     OXACILLIN <=0.25 SENSITIVE Sensitive     TETRACYCLINE <=1 SENSITIVE Sensitive      VANCOMYCIN 1 SENSITIVE Sensitive     TRIMETH/SULFA <=10 SENSITIVE Sensitive     CLINDAMYCIN >=8 RESISTANT Resistant     RIFAMPIN <=0.5 SENSITIVE Sensitive     Inducible Clindamycin NEGATIVE Sensitive     * MODERATE STAPHYLOCOCCUS AUREUS   Staphylococcus epidermidis - MIC*    CIPROFLOXACIN 4 RESISTANT Resistant     ERYTHROMYCIN >=8 RESISTANT Resistant     GENTAMICIN <=0.5 SENSITIVE Sensitive     OXACILLIN >=4 RESISTANT Resistant     TETRACYCLINE 2 SENSITIVE Sensitive     VANCOMYCIN 1 SENSITIVE Sensitive     TRIMETH/SULFA <=10 SENSITIVE Sensitive     CLINDAMYCIN >=8 RESISTANT Resistant     RIFAMPIN <=0.5 SENSITIVE Sensitive     Inducible Clindamycin NEGATIVE Sensitive     * FEW STAPHYLOCOCCUS EPIDERMIDIS    Coagulation Studies: No results for input(s): "LABPROT", "INR" in the last 72 hours.   Urinalysis: No results for input(s): "COLORURINE", "LABSPEC", "PHURINE", "GLUCOSEU", "HGBUR", "BILIRUBINUR", "KETONESUR", "PROTEINUR", "UROBILINOGEN", "NITRITE", "LEUKOCYTESUR" in the last 72 hours.  Invalid input(s): "APPERANCEUR"     Imaging: No results found.   Medications:      vitamin C  500 mg Oral BID   atorvastatin  40 mg Oral Daily   cephALEXin  250 mg Oral Q8H   fluticasone  2 spray Each Nare Daily   furosemide  40 mg Oral BID   gabapentin  100 mg Oral TID   insulin aspart  0-5 Units Subcutaneous QHS   insulin aspart  0-9 Units Subcutaneous TID WC   insulin aspart  7 Units Subcutaneous TID WC   insulin glargine-yfgn  25 Units Subcutaneous BID   loratadine  10 mg Oral Daily   metoprolol succinate  50 mg Oral BID   multivitamin with minerals  1 tablet Oral Daily   nutrition supplement (JUVEN)  1 packet Oral BID BM   Ensure Max Protein  11 oz Oral QHS   sodium bicarbonate  1,300 mg Oral BID   zinc sulfate  220 mg Oral Daily   hydrALAZINE, morphine injection, ondansetron **OR** ondansetron (ZOFRAN) IV, oxyCODONE  Assessment/ Plan:  [name] is a  66 y.o.  female with past medical history of hypertension, hyperlipidemia, diabetes, and chronic kidney disease stage V. Patient presents to ed for abnormal labs. She has been admitted for Hyponatremia [E87.1] AKI (acute kidney injury) ([place]) [N17.9] Symptomatic anemia [D64.9] Anemia, unspecified type [D64.9] Severe sepsis ([place]) [A41.9, R65.20]   Acute Kidney Injury with hyponatremia on chronic kidney disease stage V with baseline creatinine 3.91 and GFR of 12 on 12/23/21.  Acute kidney injury secondary to underlying illness Chronic kidney disease is secondary to diabetes and hypertension No exposure to IV contrast.  CT abdomen pelvis negative for obstruction, several renal stones identified.  Losartan and furosemide  held since admission.    Renal function continues to decline and patient continues to exhibit uremic symptoms.  BUN 128.  Discussed this with patient and patient agreeable to proceed with renal replacement therapy initiation.  Vascular consulted for PermCath placement.  Once placed we will initiate dialysis treatment, treatment scheduled for Friday.  Renal navigator aware of patient in outpatient clinic search will proceed tomorrow.  Lab Results  Component Value Date   CREATININE 4.36 (H) 01/08/2022   CREATININE 4.17 (H) 01/07/2022   CREATININE 4.18 (H) 01/06/2022    Intake/Output Summary (Last 24 hours) at 01/08/2022 1244 Last data filed at 01/08/2022 0446 Gross per 24 hour  Intake --  Output 900 ml  Net -900 ml    2. Anemia of chronic kidney disease Lab Results  Component Value Date   HGB 8.4 (L) 01/08/2022  Hemoglobin below desired target.  Will consider EPO with dialysis initiation.  3. Secondary Hyperparathyroidism:  Lab Results  Component Value Date   CALCIUM 10.1 01/08/2022   CAION 1.11 (L) 01/01/2022   PHOS 4.1 01/08/2022  Calcium and phosphorus within acceptable range.  We will continue to monitor during this admission.  4.  Acute metabolic acidosis.  Serum  bicarb 27.  Continue oral supplementation.  5.  Generalized edema.  Improved with oral diuresis.   LOS: [place] 6/8/202312:44 PM

## 2022-01-08 NOTE — NC FL2 (Signed)
[place] LEVEL OF CARE SCREENING TOOL     IDENTIFICATION  Patient Name: Theresa Warren Birthdate: 27-Oct-1955 Sex: female Admission Date (Current Location): 12/31/2021  [facility] and [place] Number:  Engineering geologist and Address:  [facility], [address], [place], [place] [address]      Provider Number: [id number]  Attending Physician Name and Address:  Nolberto Hanlon, MD  Relative Name and Phone Number:  none    Current Level of Care: Hospital Recommended Level of Care: [place] Prior Approval Number:    Date Approved/Denied:   PASRR Number:    Discharge Plan: SNF    Current Diagnoses: Patient Active Problem List   Diagnosis Date Noted   Anasarca associated with disorder of kidney 01/05/2022   Premature atrial complexes 01/02/2022   Sacral wound, initial encounter 01/01/2022   Swelling of left lower extremity 01/01/2022   Abdominal pain 01/01/2022   AKI (acute kidney injury) ([place]) 01/01/2022   Hyponatremia 01/01/2022   Symptomatic anemia 12/31/2021   Elevated LFTs 12/31/2021   Severe sepsis ([place]) 12/31/2021   Hypokalemia 12/21/2021   Pressure injury of skin 12/20/2021   Hypomagnesemia 12/19/2021   Acute blood loss anemia 12/19/2021   Abnormal vaginal bleeding 12/19/2021   Uncontrolled type 2 diabetes mellitus with hyperglycemia, with long-term current use of insulin ([place]) 12/19/2021   Chronic kidney disease, stage 5 ([place]) 12/18/2021   Chronic pain syndrome 12/18/2021   Fall at home 12/18/2021   DNR (do not resuscitate)/DNI(Do Not Intubate) 12/18/2021   Type 2 diabetes mellitus with chronic kidney disease, with long-term current use of insulin ([place]) 12/18/2021   Anemia of renal disease 12/18/2021   Long term (current) use of insulin ([place]) 08/30/2018   OSA (obstructive sleep apnea) 11/27/2014   Sleep disturbance 10/10/2014   Severe obesity (BMI >= 40) ([place]) 10/10/2014   Palpitation 09/11/2014    Dyspnea 09/11/2014   Morbid (severe) obesity due to excess calories ([place]) 07/01/2010   Essential hypertension 06/06/2009    Orientation RESPIRATION BLADDER Height & Weight     Self, Time, Situation, Place  Normal Continent Weight: 106.3 kg Height:  5\' 3"  (160 cm)  BEHAVIORAL SYMPTOMS/MOOD NEUROLOGICAL BOWEL NUTRITION STATUS      Continent Diet (renal/carb modified 1200 ml)  AMBULATORY STATUS COMMUNICATION OF NEEDS Skin   Extensive Assist Verbally PU Stage and Appropriate Care (Stage 3 left buttocks, right buttocks stage 4, L hip stage 4, all with wound vac)                       Personal Care Assistance Level of Assistance    Bathing Assistance: Limited assistance Feeding assistance: Limited assistance Dressing Assistance: Maximum assistance     Functional Limitations Info  Sight, Speech, Hearing Sight Info: Adequate Hearing Info: Adequate Speech Info: Adequate    SPECIAL CARE FACTORS FREQUENCY  PT (By licensed PT), OT (By licensed OT)     PT Frequency: 5x per week OT Frequency: 5x per week            Contractures      Additional Factors Info  Allergies, Code Status               Current Medications (01/08/2022):  This is the current hospital active medication list Current Facility-Administered Medications  Medication Dose Route Frequency Provider Last Rate Last Admin   ascorbic acid (VITAMIN C) tablet 500 mg  500 mg Oral BID Olean Ree, MD  500 mg at 01/08/22 1005   atorvastatin (LIPITOR) tablet 40 mg  40 mg Oral Daily Piscoya, Jose, MD   40 mg at 01/08/22 1001   cephALEXin (KEFLEX) capsule 250 mg  250 mg Oral Q8H Griffith, Kelly A, DO   250 mg at 01/08/22 1316   [START ON 01/09/2022] Chlorhexidine Gluconate Cloth 2 % PADS 6 each  6 each Topical Q0600 Breeze, Benancio Deeds, NP       fluticasone (FLONASE) 50 MCG/ACT nasal spray 2 spray  2 spray Each Nare Daily Piscoya, Jose, MD   2 spray at 01/08/22 1007   furosemide (LASIX) tablet 40 mg  40 mg Oral BID  Colon Flattery, NP   40 mg at 01/08/22 1005   gabapentin (NEURONTIN) capsule 100 mg  100 mg Oral TID Colon Flattery, NP   100 mg at 01/08/22 1004   hydrALAZINE (APRESOLINE) tablet 25 mg  25 mg Oral Q6H PRN Nicole Kindred A, DO       insulin aspart (novoLOG) injection 0-5 Units  0-5 Units Subcutaneous QHS Piscoya, Jacqulyn Bath, MD   3 Units at 01/06/22 2136   insulin aspart (novoLOG) injection 0-9 Units  0-9 Units Subcutaneous TID WC Piscoya, Jose, MD   1 Units at 01/08/22 1240   insulin aspart (novoLOG) injection 7 Units  7 Units Subcutaneous TID WC Nolberto Hanlon, MD   7 Units at 01/07/22 1640   insulin glargine-yfgn (SEMGLEE) injection 25 Units  25 Units Subcutaneous BID Nolberto Hanlon, MD   25 Units at 01/08/22 1000   loratadine (CLARITIN) tablet 10 mg  10 mg Oral Daily Piscoya, Jose, MD   10 mg at 01/08/22 1001   metoprolol succinate (TOPROL-XL) 24 hr tablet 50 mg  50 mg Oral BID Geralynn Rile, MD   50 mg at 01/08/22 1005   morphine (PF) 2 MG/ML injection 2 mg  2 mg Intravenous Q4H PRN Piscoya, Jacqulyn Bath, MD   2 mg at 01/07/22 1642   multivitamin with minerals tablet 1 tablet  1 tablet Oral Daily Piscoya, Jacqulyn Bath, MD   1 tablet at 01/08/22 1003   nutrition supplement (JUVEN) (JUVEN) powder packet 1 packet  1 packet Oral BID BM Piscoya, Jose, MD   1 packet at 01/08/22 1316   ondansetron (ZOFRAN) tablet 4 mg  4 mg Oral Q6H PRN Piscoya, Jose, MD       Or   ondansetron (ZOFRAN) injection 4 mg  4 mg Intravenous Q6H PRN Piscoya, Jose, MD   4 mg at 01/04/22 1556   oxyCODONE (Oxy IR/ROXICODONE) immediate release tablet 5-10 mg  5-10 mg Oral Q4H PRN Piscoya, Jose, MD   10 mg at 01/08/22 0532   protein supplement (ENSURE MAX) liquid  11 oz Oral QHS Piscoya, Jose, MD   11 oz at 01/07/22 2143   sodium bicarbonate tablet 1,300 mg  1,300 mg Oral BID Colon Flattery, NP   1,300 mg at 01/08/22 1004   zinc sulfate capsule 220 mg  220 mg Oral Daily Piscoya, Jose, MD   220 mg at 01/08/22 1005     Discharge  Medications: Please see discharge summary for a list of discharge medications.  Relevant Imaging Results:  Relevant Lab Results:   Additional Information [id number]  Pete Pelt, RN

## 2022-01-09 ENCOUNTER — Inpatient Hospital Stay
Admission: EM | Disposition: A | Discharge status: Skilled Nursing Facility | Payer: Self-pay | Source: Home / Self Care | Attending: Internal Medicine

## 2022-01-09 DIAGNOSIS — N186 End stage renal disease: Secondary | ICD-10-CM | POA: Diagnosis not present

## 2022-01-09 DIAGNOSIS — N189 Chronic kidney disease, unspecified: Secondary | ICD-10-CM | POA: Diagnosis not present

## 2022-01-09 DIAGNOSIS — R6520 Severe sepsis without septic shock: Secondary | ICD-10-CM | POA: Diagnosis not present

## 2022-01-09 DIAGNOSIS — L89323 Pressure ulcer of left buttock, stage 3: Secondary | ICD-10-CM | POA: Diagnosis not present

## 2022-01-09 DIAGNOSIS — A419 Sepsis, unspecified organism: Secondary | ICD-10-CM | POA: Diagnosis not present

## 2022-01-09 DIAGNOSIS — N179 Acute kidney failure, unspecified: Secondary | ICD-10-CM

## 2022-01-09 DIAGNOSIS — L89313 Pressure ulcer of right buttock, stage 3: Secondary | ICD-10-CM | POA: Diagnosis not present

## 2022-01-09 HISTORY — PX: DIALYSIS/PERMA CATHETER INSERTION: CATH118288

## 2022-01-09 LAB — RENAL FUNCTION PANEL
Albumin: 2.5 g/dL — ABNORMAL LOW (ref 3.5–5.0)
Anion gap: 11 (ref 5–15)
BUN: 123 mg/dL — ABNORMAL HIGH (ref 8–23)
CO2: 26 mmol/L (ref 22–32)
Calcium: 9.2 mg/dL (ref 8.9–10.3)
Chloride: 100 mmol/L (ref 98–111)
Creatinine, Ser: 4.73 mg/dL — ABNORMAL HIGH (ref 0.44–1.00)
GFR, Estimated: 10 mL/min — ABNORMAL LOW (ref >60)
Glucose, Bld: 87 mg/dL (ref 70–99)
Phosphorus: 4 mg/dL (ref 2.5–4.6)
Potassium: 4.7 mmol/L (ref 3.5–5.1)
Sodium: 137 mmol/L (ref 135–145)

## 2022-01-09 LAB — GLUCOSE, CAPILLARY
Glucose-Capillary: 97 mg/dL (ref 70–99)
Glucose-Capillary: 91 mg/dL (ref 70–99)
Glucose-Capillary: 90 mg/dL (ref 70–99)
Glucose-Capillary: 90 mg/dL (ref 70–99)
Glucose-Capillary: 95 mg/dL (ref 70–99)
Glucose-Capillary: 102 mg/dL — ABNORMAL HIGH (ref 70–99)

## 2022-01-09 LAB — CBC
HCT: 25.7 % — ABNORMAL LOW (ref 36.0–46.0)
Hemoglobin: 8.1 g/dL — ABNORMAL LOW (ref 12.0–15.0)
MCH: 29.3 pg (ref 26.0–34.0)
MCHC: 31.5 g/dL (ref 30.0–36.0)
MCV: 93.1 fL (ref 80.0–100.0)
Platelets: 396 10*3/uL (ref 150–400)
RBC: 2.76 MIL/uL — ABNORMAL LOW (ref 3.87–5.11)
RDW: 15 % (ref 11.5–15.5)
WBC: 11.1 10*3/uL — ABNORMAL HIGH (ref 4.0–10.5)
nRBC: 0 % (ref 0.0–0.2)

## 2022-01-09 SURGERY — DIALYSIS/PERMA CATHETER INSERTION
Anesthesia: Moderate Sedation

## 2022-01-09 MED ORDER — FENTANYL CITRATE (PF) 100 MCG/2ML IJ SOLN
INTRAMUSCULAR | Status: DC | PRN
Start: 2022-01-09 — End: 2022-01-09
  Administered 2022-01-09: 50 ug via INTRAVENOUS

## 2022-01-09 MED ORDER — MIDAZOLAM HCL 2 MG/2ML IJ SOLN
INTRAMUSCULAR | Status: DC | PRN
  Administered 2022-01-09: 2 mg via INTRAVENOUS

## 2022-01-09 MED ORDER — INSULIN GLARGINE-YFGN 100 UNIT/ML ~~LOC~~ SOLN
20.0000 [IU] | Freq: Two times a day (BID) | SUBCUTANEOUS | Status: DC
Start: 2022-01-09 — End: 2022-01-11
  Administered 2022-01-09 – 2022-01-11 (×4): 20 [IU] via SUBCUTANEOUS
  Filled 2022-01-09 (×5): qty 0.2

## 2022-01-09 MED ORDER — LIDOCAINE HCL (PF) 1 % IJ SOLN: 5.0000 mL | INTRAMUSCULAR | Status: DC | PRN

## 2022-01-09 MED ORDER — RENA-VITE PO TABS
1.0000 | ORAL_TABLET | Freq: Every day | ORAL | Status: DC
  Administered 2022-01-09 – 2022-02-09 (×32): 1 via ORAL
  Filled 2022-01-09 (×33): qty 1

## 2022-01-09 MED ORDER — ALTEPLASE 2 MG IJ SOLR: 2.0000 mg | Freq: Once | INTRAMUSCULAR | Status: DC | PRN

## 2022-01-09 MED ORDER — EPOETIN ALFA 4000 UNIT/ML IJ SOLN: 4000.0000 [IU] | Freq: Once | INTRAMUSCULAR | Status: AC

## 2022-01-09 MED ORDER — FAMOTIDINE 20 MG PO TABS: 40.0000 mg | ORAL_TABLET | Freq: Once | ORAL | Status: DC | PRN

## 2022-01-09 MED ORDER — PROSOURCE PLUS PO LIQD
30.0000 mL | Freq: Two times a day (BID) | ORAL | Status: DC
  Administered 2022-01-10 – 2022-01-14 (×8): 30 mL via ORAL
  Filled 2022-01-09 (×10): qty 30

## 2022-01-09 MED ORDER — DIPHENHYDRAMINE HCL 50 MG/ML IJ SOLN: 50.0000 mg | Freq: Once | INTRAMUSCULAR | Status: DC | PRN

## 2022-01-09 MED ORDER — PENTAFLUOROPROP-TETRAFLUOROETH EX AERO: 1.0000 "application " | INHALATION_SPRAY | CUTANEOUS | Status: DC | PRN

## 2022-01-09 MED ORDER — METHYLPREDNISOLONE SODIUM SUCC 125 MG IJ SOLR: 125.0000 mg | Freq: Once | INTRAMUSCULAR | Status: DC | PRN

## 2022-01-09 MED ORDER — INSULIN ASPART 100 UNIT/ML IJ SOLN
3.0000 [IU] | Freq: Three times a day (TID) | INTRAMUSCULAR | Status: DC
  Administered 2022-01-10: 3 [IU] via SUBCUTANEOUS
  Filled 2022-01-09: qty 1

## 2022-01-09 MED ORDER — MIDAZOLAM HCL 2 MG/2ML IJ SOLN
INTRAMUSCULAR | Status: AC
  Filled 2022-01-09: qty 4

## 2022-01-09 MED ORDER — LIDOCAINE-PRILOCAINE 2.5-2.5 % EX CREA
1.0000 "application " | TOPICAL_CREAM | CUTANEOUS | Status: DC | PRN
  Filled 2022-01-09: qty 5

## 2022-01-09 MED ORDER — EPOETIN ALFA 4000 UNIT/ML IJ SOLN
INTRAMUSCULAR | Status: AC
  Administered 2022-01-09: 4000 [IU] via INTRAVENOUS
  Filled 2022-01-09: qty 1

## 2022-01-09 MED ORDER — VANCOMYCIN HCL 1500 MG/300ML IV SOLN
1500.0000 mg | INTRAVENOUS | Status: AC
  Administered 2022-01-09: 1500 mg via INTRAVENOUS
  Filled 2022-01-09 (×2): qty 300

## 2022-01-09 MED ORDER — MORPHINE SULFATE (PF) 2 MG/ML IV SOLN
INTRAVENOUS | Status: AC
  Administered 2022-01-09: 2 mg via INTRAVENOUS
  Filled 2022-01-09: qty 1

## 2022-01-09 MED ORDER — FENTANYL CITRATE PF 50 MCG/ML IJ SOSY
PREFILLED_SYRINGE | INTRAMUSCULAR | Status: AC
  Filled 2022-01-09: qty 2

## 2022-01-09 MED ORDER — SODIUM CHLORIDE 0.9 % IV SOLN: INTRAVENOUS | Status: DC

## 2022-01-09 MED ORDER — MIDAZOLAM HCL 2 MG/ML PO SYRP: 8.0000 mg | ORAL_SOLUTION | Freq: Once | ORAL | Status: DC | PRN

## 2022-01-09 MED ORDER — HYDROMORPHONE HCL 1 MG/ML IJ SOLN
1.0000 mg | Freq: Once | INTRAMUSCULAR | Status: AC | PRN
  Administered 2022-01-24: 1 mg via INTRAVENOUS
  Filled 2022-01-09: qty 1

## 2022-01-09 MED ORDER — ACETAMINOPHEN 325 MG PO TABS
650.0000 mg | ORAL_TABLET | Freq: Four times a day (QID) | ORAL | Status: DC | PRN
  Administered 2022-01-09 – 2022-02-09 (×19): 650 mg via ORAL
  Filled 2022-01-09 (×15): qty 2

## 2022-01-09 MED ORDER — ONDANSETRON HCL 4 MG/2ML IJ SOLN
4.0000 mg | Freq: Four times a day (QID) | INTRAMUSCULAR | Status: DC | PRN
Start: 2022-01-09 — End: 2022-01-29

## 2022-01-09 MED ORDER — HEPARIN SODIUM (PORCINE) 1000 UNIT/ML DIALYSIS
1000.0000 [IU] | INTRAMUSCULAR | Status: DC | PRN
  Administered 2022-01-30 – 2022-02-09 (×2): 1000 [IU] via INTRAVENOUS_CENTRAL
  Filled 2022-01-09 (×8): qty 1

## 2022-01-09 MED ORDER — HEPARIN SODIUM (PORCINE) 1000 UNIT/ML IJ SOLN
INTRAMUSCULAR | Status: AC
  Filled 2022-01-09: qty 10

## 2022-01-09 SURGICAL SUPPLY — 13 items
ADH SKN CLS APL DERMABOND .7 (GAUZE/BANDAGES/DRESSINGS) ×1
CATH CANNON HEMO 15FR 19 (HEMODIALYSIS SUPPLIES) ×1 IMPLANT
COVER PROBE U/S 5X48 (MISCELLANEOUS) ×1 IMPLANT
DERMABOND ADVANCED (GAUZE/BANDAGES/DRESSINGS) ×1
DERMABOND ADVANCED .7 DNX12 (GAUZE/BANDAGES/DRESSINGS) IMPLANT
DRAPE INCISE 23X17 IOBAN STRL (DRAPES) ×1
DRAPE INCISE 23X17 STRL (DRAPES) IMPLANT
DRAPE INCISE IOBAN 23X17 STRL (DRAPES) ×1 IMPLANT
NDL ENTRY 21GA 7CM ECHOTIP (NEEDLE) IMPLANT
NEEDLE ENTRY 21GA 7CM ECHOTIP (NEEDLE) ×2 IMPLANT
SET INTRO CAPELLA COAXIAL (SET/KITS/TRAYS/PACK) ×1 IMPLANT
SUT MNCRL AB 4-0 PS2 18 (SUTURE) ×1 IMPLANT
SUT SILK 0 FSL (SUTURE) ×1 IMPLANT

## 2022-01-09 NOTE — Progress Notes (Addendum)
Nutrition Follow-up  DOCUMENTATION CODES:   Morbid obesity  INTERVENTION:   -Once diet is resumed:  -D/c Ensure Max po daily, each supplement provides 150 kcal and 30 grams of protein.   -30 ml Prosource Plus BID, each supplement provides 100 kcals and 15 grams protein -Continue 1 packet Juven BID, each packet provides 95 calories, 2.5 grams of protein (collagen), and 9.8 grams of carbohydrate (3 grams sugar); also contains 7 grams of L-arginine and L-glutamine, 300 mg vitamin C, 15 mg vitamin E, 1.2 mcg vitamin B-12, 9.5 mg zinc, 200 mg calcium, and 1.5 g  Calcium Beta-hydroxy-Beta-methylbutyrate to support wound healing  -D/c MVI with minerals daily -Renal MVI daily -Continue 500 mg vitamin C BID -Continue 220 mg zinc sulfate daily x 14 days  NUTRITION DIAGNOSIS:   Increased nutrient needs related to wound healing as evidenced by estimated needs.  Ongoing  GOAL:   Patient will meet greater than or equal to 90% of their needs  Progressing   MONITOR:   PO intake, Supplement acceptance, Diet advancement  REASON FOR ASSESSMENT:   Malnutrition Screening Tool    ASSESSMENT:   Pt with PMH of hyperlipidemia, hypertension, insulin-dependent diabetes mellitus, who presents from [facility] for chief concerns of abnormal serum sodium levels on labs.  5/31- s/p Debridement of three decubitus wounds of bilateral buttocks, total debridement area of 133 cm2;  Placement of negative pressure dressing > 50 cm. 6/7- vac changed 6/9- vac changed  Reviewed I/O's: +492 ml x 24 hours and -4.4 L since admission  UOP: 400 ml x 24 hours   Per vascular surgery notes, pt has begun to display uremic symptoms. Plan to place permacath today to start HD.   Pt with good appetite. Noted meal completions 50-100%. Pt has been taking supplements.   Medications reviewed and include vitamin C and zinc sulfate.   Labs reviewed: CBGS: 91-139 (inpatient orders for glycemic control are 0-5 units  insulin aspart daily at bedtime, 0-9 units insulin aspart TID with meals, 3 units insulin aspart TID with meals, and 20 units inuslin glargine-yfgn daily).    Diet Order:   Diet Order             Diet NPO time specified  Diet effective midnight           Diet NPO time specified Except for: Sips with Meds  Diet effective midnight                   EDUCATION NEEDS:   Not appropriate for education at this time  Skin:  Skin Assessment: Skin Integrity Issues: Skin Integrity Issues:: Wound VAC Stage II: rt buttocks, bilateral buttocks, lt hips Stage III: lt buttocks, rt hip Wound Vac: buttocks  Last BM:  01/01/22  Height:   Ht Readings from Last 1 Encounters:  12/31/21 5\' 3"  (1.6 m)    Weight:   Wt Readings from Last 1 Encounters:  01/09/22 106.5 kg    Ideal Body Weight:  52.3 kg  BMI:  Body mass index is 41.59 kg/m.  Estimated Nutritional Needs:   Kcal:  1650-1850  Protein:  90-105 grams  Fluid:  1000 ml + UOP    Loistine Chance, RD, LDN, [place] Registered Dietitian II Certified Diabetes Care and Education Specialist Please refer to [facility] for RD and/or RD on-call/weekend/after hours pager

## 2022-01-09 NOTE — Op Note (Signed)
OPERATIVE NOTE   PROCEDURE: Insertion of tunneled dialysis catheter right IJ approach with ultrasound and fluoroscopic guidance.  PRE-OPERATIVE DIAGNOSIS: Acute on chronic renal insufficiency  POST-OPERATIVE DIAGNOSIS: Same  SURGEON: Theresa Pilar.  ANESTHESIA: Conscious sedation was administered under my direct supervision by the interventional radiology RN. IV Versed plus fentanyl were utilized. Continuous ECG, pulse oximetry and blood pressure was monitored throughout the entire procedure. Conscious sedation was for a total of 21 minutes.  ESTIMATED BLOOD LOSS: Minimal cc  CONTRAST USED:  None  FLUOROSCOPY TIME: 0.5 minutes  INDICATIONS:   Theresa Warren a 66 y.o. y.o. female who presents with acute on chronic renal insufficiency.  She now has reached a state that requires hemodialysis and is therefore undergoing placement of a tunneled catheter.  Risks and benefits of been reviewed all questions answered patient has agreed to proceed.  DESCRIPTION: After obtaining full informed written consent, the patient was positioned supine. The right neck and chest wall was prepped and draped in a sterile fashion. Ultrasound was placed in a sterile sleeve. Ultrasound was utilized to identify the right internal jugular vein which is noted to be echolucent and compressible indicating patency. Image is recorded for the permanent record. Under direct ultrasound visualization a micro-needle is inserted into the vein followed by the micro-wire. Micro-sheath was then advanced and a J wire is inserted without difficulty under fluoroscopic guidance. Small counterincision was made at the wire insertion site. Dilators are passed over the wire and the tunneled dialysis catheter is fed into the central venous system without difficulty.  Under fluoroscopy the catheter tip positioned at the atrial caval junction. The catheter is then approximated to the right chest wall and an exit site selected. 1%  lidocaine is infiltrated in soft tissues at this level small incision is made and the tunneling device is then passed from the exit site to the right neck counterincision. Catheter is then connected to the tunneling device and the catheter was pulled subcutaneously. It is then transected and the hub assembly connected without difficulty. Both lumens aspirate and flush easily. After verification of smooth contour with proper tip position under fluoroscopy the catheter is packed with 5000 units of heparin per lumen.  Catheter secured to the skin of the right chest wall with 0 silk. A sterile dressing is applied with a Biopatch.  COMPLICATIONS: None  CONDITION: Good  Theresa Warren renovascular. Office:  [phone]   01/09/2022,3:20 PM

## 2022-01-09 NOTE — Progress Notes (Signed)
Physical Therapy Treatment Patient Details Name: Theresa Warren MRN: [id number] DOB: 1956/07/09 Today's Date: 01/09/2022   History of Present Illness [name] is a 66 year old female admitted for evaluation of low sodium noted on labs at the facility.  Pt was discharged  to rehab on 5/23 after being admitted following a fall. PMH significant for acute on chronic anemia. hyperlipidemia, hypertension, CKD stage V, insulin-dependent diabetes mellitus    PT Comments    Pt received supine in bed, states she is "glued to the bed" and is agreeable to therapy. Overall, pt mobility status has declined; she currently requires MAX A for bed mobility and is unable to stand from EOB. MD notified who states pt has plans for HD.  She was able to remain sitting EOB for ~15 minutes with sitting balance improving over time; initially presenting with posterior lean. Pt was returned to bed for wound care to follow up with dressing change. Would benefit from skilled PT to address above deficits and promote optimal return to PLOF.   Recommendations for follow up therapy are one component of a multi-disciplinary discharge planning process, led by the attending physician.  Recommendations may be updated based on patient status, additional functional criteria and insurance authorization.  Follow Up Recommendations  Skilled nursing-short term rehab (<3 hours/day)     Assistance Recommended at Discharge Frequent or constant Supervision/Assistance  Patient can return home with the following A lot of help with bathing/dressing/bathroom;Assistance with cooking/housework;Help with stairs or ramp for entrance;Assist for transportation;A lot of help with walking and/or transfers;Direct supervision/assist for medications management   Equipment Recommendations  Rolling walker (2 wheels);BSC/3in1    Recommendations for Other Services       Precautions / Restrictions Precautions Precautions:  Fall Restrictions Weight Bearing Restrictions: No     Mobility  Bed Mobility Overal bed mobility: Needs Assistance Bed Mobility: Supine to Sit, Sit to Supine     Supine to sit: HOB elevated, Max assist Sit to supine: Max assist   General bed mobility comments: MAX A with management of BLE and trunk; very little assist with boostin gup in bed once bed was in trendelenberg Patient Response: Cooperative  Transfers Overall transfer level: Needs assistance Equipment used: Rolling walker (2 wheels) Transfers: Sit to/from Stand Sit to Stand: Max assist           General transfer comment: Unable to clear hips from EOB; attempted x2 reps with MAX lifting assist.    Ambulation/Gait               General Gait Details: unable   Stairs             Wheelchair Mobility    Modified Rankin (Stroke Patients Only)       Balance Overall balance assessment: Needs assistance Sitting-balance support: Feet supported, Bilateral upper extremity supported Sitting balance-Leahy Scale: Poor Sitting balance - Comments: Initially POOR with posterior lean and consistent tactile cueing to correct; improved to FAIR over time with no LOB. Postural control: Posterior lean     Standing balance comment: unable to stand                            Cognition Arousal/Alertness: Awake/alert Behavior During Therapy: WFL for tasks assessed/performed Overall Cognitive Status: Impaired/Different from baseline Area of Impairment: Safety/judgement, Problem solving  Safety/Judgement: Decreased awareness of deficits   Problem Solving: Requires verbal cues General Comments: Some confusion; hallucination stating the floor is moving; unaware that she is making noises (moaning/groaning)        Exercises      General Comments General comments (skin integrity, edema, etc.): wound vac on buttocks; open sores on posterior hips and legs       Pertinent Vitals/Pain Pain Assessment Pain Assessment: Faces Faces Pain Scale: Hurts little more Pain Location: sacrum Pain Descriptors / Indicators: Discomfort, Grimacing Pain Intervention(s): Repositioned    Home Living                          Prior Function            PT Goals (current goals can now be found in the care plan section) Acute Rehab PT Goals Patient Stated Goal: none stated PT Goal Formulation: With patient Time For Goal Achievement: 01/17/22    Frequency    Min 2X/week      PT Plan      Co-evaluation              AM-PAC PT "6 Clicks" Mobility   Outcome Measure  Help needed turning from your back to your side while in a flat bed without using bedrails?: A Lot Help needed moving from lying on your back to sitting on the side of a flat bed without using bedrails?: A Lot Help needed moving to and from a bed to a chair (including a wheelchair)?: Total Help needed standing up from a chair using your arms (e.g., wheelchair or bedside chair)?: Total Help needed to walk in hospital room?: Total Help needed climbing 3-5 steps with a railing? : Total 6 Click Score: 8    End of Session Equipment Utilized During Treatment: Gait belt;Oxygen Activity Tolerance: Patient limited by fatigue Patient left: in bed;with nursing/sitter in room;with call bell/phone within reach Nurse Communication: Mobility status PT Visit Diagnosis: Unsteadiness on feet (R26.81);Muscle weakness (generalized) (M62.81);Difficulty in walking, not elsewhere classified (R26.2)     Time: 7893-8101 PT Time Calculation (min) (ACUTE ONLY): 26 min  Charges:  $Therapeutic Activity: 23-37 mins                      Patrina Levering PT, DPT

## 2022-01-09 NOTE — Consult Note (Signed)
[facility] Vascular Consult Note  MRN : [id number]  Theresa Warren is a 66 y.o. ([birth year]) female who presents with chief complaint of  Chief Complaint  Patient presents with   abnormal labs  .   Consulting Physician:Shantelle Gwyneth Revels, NP Reason for consult: PermCath placement History of Present Illness: Theresa Warren is a 66 year old female with a history of hyperlipidemia, hypertension, chronic kidney disease stage V, and diabetes mellitus who presents to [facility] following low sodium.  She was found to have progressed with acute kidney injury and has begun to have uremic-like symptoms.  It was recommended by nephrology that she began dialysis.  Current Facility-Administered Medications  Medication Dose Route Frequency Provider Last Rate Last Admin   0.9 %  sodium chloride infusion   Intravenous Continuous Kris Hartmann, NP 75 mL/hr at 01/09/22 4098 Infusion Verify at 01/09/22 1191   alteplase (CATHFLO ACTIVASE) injection 2 mg  2 mg Intracatheter Once PRN Colon Flattery, NP       ascorbic acid (VITAMIN C) tablet 500 mg  500 mg Oral BID Piscoya, Jose, MD   500 mg at 01/08/22 2132   atorvastatin (LIPITOR) tablet 40 mg  40 mg Oral Daily Piscoya, Jose, MD   40 mg at 01/08/22 1001   cephALEXin (KEFLEX) capsule 250 mg  250 mg Oral Q8H Griffith, Kelly A, DO   250 mg at 01/09/22 0533   Chlorhexidine Gluconate Cloth 2 % PADS 6 each  6 each Topical Q0600 Colon Flattery, NP   6 each at 01/09/22 0533   fluticasone (FLONASE) 50 MCG/ACT nasal spray 2 spray  2 spray Each Nare Daily Piscoya, Jose, MD   2 spray at 01/08/22 1007   furosemide (LASIX) tablet 40 mg  40 mg Oral BID Colon Flattery, NP   40 mg at 01/08/22 1754   gabapentin (NEURONTIN) capsule 100 mg  100 mg Oral TID Colon Flattery, NP   100 mg at 01/08/22 2132   heparin injection 1,000 Units  1,000 Units Dialysis PRN Colon Flattery, NP       hydrALAZINE (APRESOLINE) tablet 25 mg  25 mg Oral  Q6H PRN Nicole Kindred A, DO       insulin aspart (novoLOG) injection 0-5 Units  0-5 Units Subcutaneous QHS Piscoya, Jacqulyn Bath, MD   3 Units at 01/06/22 2136   insulin aspart (novoLOG) injection 0-9 Units  0-9 Units Subcutaneous TID WC Piscoya, Jose, MD   1 Units at 01/08/22 1240   insulin aspart (novoLOG) injection 7 Units  7 Units Subcutaneous TID WC Nolberto Hanlon, MD   7 Units at 01/07/22 1640   insulin glargine-yfgn (SEMGLEE) injection 25 Units  25 Units Subcutaneous BID Nolberto Hanlon, MD   25 Units at 01/08/22 2156   lidocaine (PF) (XYLOCAINE) 1 % injection 5 mL  5 mL Intradermal PRN Colon Flattery, NP       lidocaine-prilocaine (EMLA) cream 1 application   1 application  Topical PRN Colon Flattery, NP       loratadine (CLARITIN) tablet 10 mg  10 mg Oral Daily Piscoya, Jose, MD   10 mg at 01/08/22 1001   metoprolol succinate (TOPROL-XL) 24 hr tablet 50 mg  50 mg Oral BID Geralynn Rile, MD   50 mg at 01/08/22 2132   morphine (PF) 2 MG/ML injection 2 mg  2 mg Intravenous Q4H PRN Olean Ree, MD   2 mg at 01/07/22 1642   multivitamin with minerals tablet 1 tablet  1 tablet Oral Daily Piscoya,  Jose, MD   1 tablet at 01/08/22 1003   nutrition supplement (JUVEN) (JUVEN) powder packet 1 packet  1 packet Oral BID BM Piscoya, Jose, MD   1 packet at 01/08/22 1316   ondansetron (ZOFRAN) tablet 4 mg  4 mg Oral Q6H PRN Olean Ree, MD       Or   ondansetron (ZOFRAN) injection 4 mg  4 mg Intravenous Q6H PRN Piscoya, Jose, MD   4 mg at 01/04/22 1556   oxyCODONE (Oxy IR/ROXICODONE) immediate release tablet 5-10 mg  5-10 mg Oral Q4H PRN Olean Ree, MD   10 mg at 01/08/22 1850   pentafluoroprop-tetrafluoroeth (GEBAUERS) aerosol 1 application   1 application  Topical PRN Colon Flattery, NP       protein supplement (ENSURE MAX) liquid  11 oz Oral QHS Piscoya, Jose, MD   11 oz at 01/08/22 2156   sodium bicarbonate tablet 1,300 mg  1,300 mg Oral BID Colon Flattery, NP   1,300 mg at 01/08/22  2132   zinc sulfate capsule 220 mg  220 mg Oral Daily Piscoya, Jacqulyn Bath, MD   220 mg at 01/08/22 1005    Past Medical History:  Diagnosis Date   CKD (chronic kidney disease), stage III ([place])    Diabetes ([place])    Hyperlipidemia    Hypertension    Obesity     Past Surgical History:  Procedure Laterality Date   ACHILLES TENDON REPAIR     ANKLE SURGERY     INCISION AND DRAINAGE ABSCESS Bilateral 01/01/2022   Procedure: INCISION AND DRAINAGE ABSCESS-Sacral Decubitus;  Surgeon: Olean Ree, MD;  Location: [facility] ORS;  Service: General;  Laterality: Bilateral;   URETHRAL STRICTURE DILATATION      Social History Social History   Tobacco Use   Smoking status: Never  Substance Use Topics   Alcohol use: Not Currently   Drug use: Not Currently    Family History Family History  Problem Relation Age of Onset   Alzheimer's disease Father    Diabetes Father    CAD Father 35   CAD Other        Multliple maternal aunts and uncles with early onset heart disease   Lung cancer Sister    ALS Mother     Allergies  Allergen Reactions   Codeine Nausea And Vomiting   Sulfa Antibiotics Swelling   Clindamycin/Lincomycin Rash   Penicillins Rash     REVIEW OF SYSTEMS (Negative unless checked)  Constitutional: [] Weight loss  [] Fever  [] Chills Cardiac: [] Chest pain   [] Chest pressure   [] Palpitations   [] Shortness of breath when laying flat   [] Shortness of breath at rest   [] Shortness of breath with exertion. Vascular:  [] Pain in legs with walking   [] Pain in legs at rest   [] Pain in legs when laying flat   [] Claudication   [] Pain in feet when walking  [] Pain in feet at rest  [] Pain in feet when laying flat   [] History of DVT   [] Phlebitis   [x] Swelling in legs   [] Varicose veins   [] Non-healing ulcers Pulmonary:   [] Uses home oxygen   [] Productive cough   [] Hemoptysis   [] Wheeze  [] COPD   [] Asthma Neurologic:  [] Dizziness  [] Blackouts   [] Seizures   [] History of stroke   [] History of TIA   [] Aphasia   [] Temporary blindness   [] Dysphagia   [] Weakness or numbness in arms   [] Weakness or numbness in legs Musculoskeletal:  [] Arthritis   [] Joint swelling   [] Joint  pain   [] Low back pain Hematologic:  [] Easy bruising  [] Easy bleeding   [] Hypercoagulable state   [] Anemic  [] Hepatitis Gastrointestinal:  [] Blood in stool   [] Vomiting blood  [] Gastroesophageal reflux/heartburn   [] Difficulty swallowing. Genitourinary:  [] Chronic kidney disease   [] Difficult urination  [] Frequent urination  [] Burning with urination   [] Blood in urine Skin:  [] Rashes   [] Ulcers   [] Wounds Psychological:  [] History of anxiety   []  History of major depression.  Physical Examination  Vitals:   01/09/22 0018 01/09/22 0443 01/09/22 0500 01/09/22 0817  BP: (!) 130/49 (!) 142/64  103/60  Pulse: 84 (!) 47  82  Resp: 16 19  20   Temp: 98.1 F (36.7 C) 98.6 F (37 C)  100.2 F (37.9 C)  TempSrc:      SpO2: 95% 93%  95%  Weight:   106.5 kg   Height:       Body mass index is 41.59 kg/m. Gen:  WD/WN, NAD Head: [place]/AT, No temporalis wasting. Prominent temp pulse not noted. Ear/Nose/Throat: Hearing grossly intact, nares w/o erythema or drainage, oropharynx w/o Erythema/Exudate Eyes: Sclera non-icteric, conjunctiva clear Neck: Trachea midline.  No JVD.  Pulmonary:  Good air movement, respirations not labored, equal bilaterally.  Cardiac: RRR, normal S1, S2. Vascular: 1+ edema Vessel Right Left  Radial Palpable Palpable   Gastrointestinal: soft, non-tender/non-distended. No guarding/reflex.  Musculoskeletal: M/S 5/5 throughout.  Extremities without ischemic changes.  No deformity or atrophy.  Neurologic: Sensation grossly intact in extremities.  Symmetrical.  Speech is fluent. Motor exam as listed above. Psychiatric: Judgment intact, Mood & affect appropriate for pt's clinical situation. Dermatologic: No rashes or ulcers noted.  No cellulitis or open wounds. Lymph : No Cervical, Axillary, or Inguinal  lymphadenopathy.    CBC Lab Results  Component Value Date   WBC 11.6 (H) 01/08/2022   HGB 8.4 (L) 01/08/2022   HCT 26.1 (L) 01/08/2022   MCV 93.9 01/08/2022   PLT 445 (H) 01/08/2022    BMET    Component Value Date/Time   NA 134 (L) 01/08/2022 0941   K 4.9 01/08/2022 0941   CL 95 (L) 01/08/2022 0941   CO2 27 01/08/2022 0941   GLUCOSE 145 (H) 01/08/2022 0941   BUN 128 (H) 01/08/2022 0941   CREATININE 4.36 (H) 01/08/2022 0941   CALCIUM 10.1 01/08/2022 0941   GFRNONAA 11 (L) 01/08/2022 0941   Estimated Creatinine Clearance: 15.2 mL/min (A) (by C-G formula based on SCr of 4.36 mg/dL (H)).  COAG Lab Results  Component Value Date   INR 1.4 (H) 12/31/2021    Radiology ECHOCARDIOGRAM COMPLETE  Result Date: 01/03/2022    ECHOCARDIOGRAM REPORT   Patient Name:   [facility] [place] Date of Exam: 01/03/2022 Medical Rec #:  [id number]              Height:       63.0 in Accession #:    [id number]             Weight:       213.8 lb Date of Birth:  1955-11-08              BSA:          1.989 m Patient Age:    61 years               BP:           129/62 mmHg Patient Gender: F  HR:           88 bpm. Exam Location:  [facility] Procedure: 2D Echo Indications:     Atrial Fibrillation I48.91  History:         Patient has no prior history of Echocardiogram examinations.  Sonographer:     Kathlen Brunswick RDCS Referring Phys:  [id number] SHERI HAMMOCK Diagnosing Phys: Eleonore Chiquito MD IMPRESSIONS  1. Left ventricular ejection fraction, by estimation, is 60 to 65%. Left ventricular ejection fraction by 2D MOD biplane is 63.2 %. The left ventricle has normal function. The left ventricle has no regional wall motion abnormalities. There is mild asymmetric left ventricular hypertrophy of the basal-septal segment. Left ventricular diastolic parameters were normal.  2. Right ventricular systolic function is normal. The right ventricular size is normal. Tricuspid regurgitation signal is inadequate for  assessing PA pressure.  3. The mitral valve is grossly normal. Trivial mitral valve regurgitation. No evidence of mitral stenosis.  4. The aortic valve is tricuspid. There is mild calcification of the aortic valve. Aortic valve regurgitation is trivial. Aortic valve sclerosis is present, with no evidence of aortic valve stenosis.  5. The inferior vena cava is normal in size with greater than 50% respiratory variability, suggesting right atrial pressure of 3 mmHg. FINDINGS  Left Ventricle: Left ventricular ejection fraction, by estimation, is 60 to 65%. Left ventricular ejection fraction by 2D MOD biplane is 63.2 %. The left ventricle has normal function. The left ventricle has no regional wall motion abnormalities. The left ventricular internal cavity size was normal in size. There is mild asymmetric left ventricular hypertrophy of the basal-septal segment. Left ventricular diastolic parameters were normal. Right Ventricle: The right ventricular size is normal. No increase in right ventricular wall thickness. Right ventricular systolic function is normal. Tricuspid regurgitation signal is inadequate for assessing PA pressure. Left Atrium: Left atrial size was normal in size. Right Atrium: Right atrial size was normal in size. Pericardium: There is no evidence of pericardial effusion. Mitral Valve: The mitral valve is grossly normal. Trivial mitral valve regurgitation. No evidence of mitral valve stenosis. Tricuspid Valve: The tricuspid valve is grossly normal. Tricuspid valve regurgitation is not demonstrated. No evidence of tricuspid stenosis. Aortic Valve: The aortic valve is tricuspid. There is mild calcification of the aortic valve. Aortic valve regurgitation is trivial. Aortic valve sclerosis is present, with no evidence of aortic valve stenosis. Aortic valve peak gradient measures 7.8 mmHg. Pulmonic Valve: The pulmonic valve was grossly normal. Pulmonic valve regurgitation is not visualized. No evidence of  pulmonic stenosis. Aorta: The aortic root is normal in size and structure. Venous: The inferior vena cava is normal in size with greater than 50% respiratory variability, suggesting right atrial pressure of 3 mmHg. IAS/Shunts: The atrial septum is grossly normal.  LEFT VENTRICLE PLAX 2D                        Biplane EF (MOD) LVIDd:         3.45 cm         LV Biplane EF:   Left LVIDs:         2.37 cm                          ventricular LV PW:         1.43 cm  ejection LV IVS:        1.24 cm                          fraction by                                                 2D MOD                                                 biplane is LV Volumes (MOD)                                63.2 %. LV vol d, MOD    65.4 ml A2C:                           Diastology LV vol d, MOD    78.7 ml       LV e' medial:    7.72 cm/s A4C:                           LV E/e' medial:  13.6 LV vol s, MOD    24.5 ml       LV e' lateral:   8.16 cm/s A2C:                           LV E/e' lateral: 12.9 LV vol s, MOD    27.2 ml A4C: LV SV MOD A2C:   40.9 ml LV SV MOD A4C:   78.7 ml LV SV MOD BP:    45.4 ml RIGHT VENTRICLE RV Basal diam:  3.07 cm RV S prime:     16.00 cm/s TAPSE (M-mode): 2.2 cm LEFT ATRIUM             Index        RIGHT ATRIUM           Index LA diam:        4.20 cm 2.11 cm/m   RA Area:     10.30 cm LA Vol (A2C):   24.3 ml 12.21 ml/m  RA Volume:   20.60 ml  10.35 ml/m LA Vol (A4C):   33.9 ml 17.04 ml/m LA Biplane Vol: 30.7 ml 15.43 ml/m  AORTIC VALVE              PULMONIC VALVE AV Vmax:      140.00 cm/s PV Vmax:       1.05 m/s AV Peak Grad: 7.8 mmHg    PV Peak grad:  4.4 mmHg LVOT Vmax:    102.00 cm/s LVOT Vmean:   67.500 cm/s LVOT VTI:     0.241 m MITRAL VALVE MV Area (PHT): 4.06 cm     SHUNTS MV Decel Time: 187 msec     Systemic VTI: 0.24 m MV E velocity: 105.00 cm/s MV A velocity: 84.40 cm/s MV E/A ratio:  1.24 Eleonore Chiquito MD Electronically signed by Eleonore Chiquito MD Signature Date/Time:  01/03/2022/4:18:04 PM    Final  US Abdomen Limited RUQ (LIVER/GB)  Result Date: 01/01/2022 CLINICAL DATA:  Transaminitis EXAM: ULTRASOUND ABDOMEN LIMITED RIGHT UPPER QUADRANT COMPARISON:  CT today FINDINGS: Gallbladder: No gallstones or wall thickening visualized. No sonographic Murphy sign noted by sonographer. Common bile duct: Diameter: Normal caliber, 4 mm Liver: No focal lesion identified. Within normal limits in parenchymal echogenicity. Portal vein is patent on color Doppler imaging with normal direction of blood flow towards the liver. Other: None. IMPRESSION: Unremarkable right upper quadrant ultrasound. Electronically Signed   By: Rolm Baptise M.D.   On: 01/01/2022 19:09   CT ABDOMEN PELVIS WO CONTRAST  Result Date: 01/01/2022 CLINICAL DATA:  66 year old female with acute abdominal pain. Decreased p.o. intake and urine output. EXAM: CT ABDOMEN AND PELVIS WITHOUT CONTRAST TECHNIQUE: Multidetector CT imaging of the abdomen and pelvis was performed following the standard protocol without IV contrast. RADIATION DOSE REDUCTION: This exam was performed according to the departmental dose-optimization program which includes automated exposure control, adjustment of the mA and/or kV according to patient size and/or use of iterative reconstruction technique. COMPARISON:  Pelvis ultrasound 12/19/2021. FINDINGS: Lower chest: Borderline to mild cardiomegaly and minor atelectasis at the lung bases. No pericardial or pleural effusion. Hepatobiliary: Negative noncontrast liver and gallbladder. Pancreas: Fatty atrophy. Spleen: Fairly circumscribed 2.5 cm low-density area in the medial spleen appears to be solitary on this noncontrast exam (series 2, image 21) and is most likely benign such as hemangioma (no follow-up imaging recommended). Adrenals/Urinary Tract: Normal adrenal glands. Nonobstructed kidneys. No nephrolithiasis. Numerous small bilateral renal cysts (no follow-up imaging recommended). Both ureters are  decompressed to the bladder. Bladder distension estimated at 520 mL. No bladder wall thickening or perivesical inflammation identified. Stomach/Bowel: Large bowel retained stool. Occasional diverticula in the sigmoid colon. Oral contrast has reached the hepatic flexure. No large bowel inflammation. Normal appendix on series 2, image 68. Negative terminal ileum. No dilated small bowel. Decompressed stomach. Negative duodenum. No free air, free fluid, or convincing mesenteric inflammation. Vascular/Lymphatic: Widespread calcified atherosclerosis. Calcified aortic atherosclerosis. Normal caliber abdominal aorta. No lymphadenopathy identified. Reproductive: Abnormal low-density endometrial thickening for age (series 2, image 78 and sagittal image 54). Small calcified fundal fibroid. Ovaries are within normal limits. Other: No pelvic free fluid. Musculoskeletal: Chronic appearing right 7th rib fracture. No acute osseous abnormality identified. Bilateral flank body wall edema is partially visible and greater on the right (series 2, image 73). IMPRESSION: 1. Abnormal low-density endometrial thickening for a postmenopausal patient. Endometrial sampling is indicated to exclude carcinoma. If results are benign, sonohysterogram should be considered for focal lesion work-up. (Ref: Radiological Reasoning: Algorithmic Workup of Abnormal Vaginal Bleeding with Endovaginal Sonography and Sonohysterography. AJR 2008; 626:R48-54). 2. Bladder distension estimated at 520 mL. Query urinary retention. 3. No other acute or inflammatory process identified in the noncontrast abdomen or pelvis. Aortic Atherosclerosis (ICD10-I70.0). Electronically Signed   By: Genevie Ann M.D.   On: 01/01/2022 04:19   DG Chest Port 1 View  Result Date: 01/01/2022 CLINICAL DATA:  Hyponatremia EXAM: PORTABLE CHEST 1 VIEW COMPARISON:  12/18/2021 FINDINGS: Lungs are clear.  No pleural effusion or pneumothorax. The heart is normal in size. IMPRESSION: No evidence of  acute cardiopulmonary disease. Electronically Signed   By: Julian Hy M.D.   On: 01/01/2022 00:03   US PELVIS (TRANSABDOMINAL ONLY)  Result Date: 12/19/2021 CLINICAL DATA:  Abnormal vaginal bleeding. EXAM: TRANSABDOMINAL ULTRASOUND OF PELVIS TECHNIQUE: Transabdominal ultrasound examination of the pelvis was performed including evaluation of the uterus, ovaries, adnexal regions, and pelvic cul-de-sac.  Patient declined transvaginal examination. COMPARISON:  None Available. FINDINGS: Uterus Measurements: 10.1 x 5.7 x 4.8 cm = volume: 145 mL. Shadowing echogenic structure measuring 1.5 x 1.2 x 0.9 cm in the uterine fundus. Endometrium Thickness: 6.4 mm. Heterogeneous appearance of the endometrium along the lower uterine segment. Right ovary Not visualized. Left ovary Not visualized. Other findings:  No abnormal free fluid. IMPRESSION: Evaluation is somewhat limited by patient's refusal of the transvaginal portion of the examination. Within this context: 1. Heterogeneous appearance of the endometrium along the lower uterine segment with the endometrial stripe measuring 6.4 mm, consider further evaluation with endometrial sampling versus sonohysterogram in the setting of abnormal postmenopausal uterine bleeding. 2. Shadowing echogenic structure measuring 1.5 cm in the uterine fundus, likely a calcified fibroid. 3. Bilateral ovaries are not visualized and may be atrophic or obscured by bowel gas. Electronically Signed   By: Dahlia Bailiff M.D.   On: 12/19/2021 13:17   US Venous Img Lower Unilateral Left  Result Date: 12/18/2021 CLINICAL DATA:  Unilateral leg swelling EXAM: LEFT LOWER EXTREMITY VENOUS DOPPLER ULTRASOUND TECHNIQUE: Gray-scale sonography with graded compression, as well as color Doppler and duplex ultrasound were performed to evaluate the lower extremity deep venous systems from the level of the common femoral vein and including the common femoral, femoral, profunda femoral, popliteal and calf  veins including the posterior tibial, peroneal and gastrocnemius veins when visible. The superficial great saphenous vein was also interrogated. Spectral Doppler was utilized to evaluate flow at rest and with distal augmentation maneuvers in the common femoral, femoral and popliteal veins. COMPARISON:  None Available. FINDINGS: Contralateral Common Femoral Vein: Respiratory phasicity is normal and symmetric with the symptomatic side. No evidence of thrombus. Normal compressibility. Common Femoral Vein: No evidence of thrombus. Normal compressibility, respiratory phasicity and response to augmentation. Saphenofemoral Junction: No evidence of thrombus. Normal compressibility and flow on color Doppler imaging. Profunda Femoral Vein: No evidence of thrombus. Normal compressibility and flow on color Doppler imaging. Femoral Vein: No evidence of thrombus. Normal compressibility, respiratory phasicity and response to augmentation. Popliteal Vein: No evidence of thrombus. Normal compressibility, respiratory phasicity and response to augmentation. Calf Veins: No evidence of thrombus. Normal compressibility and flow on color Doppler imaging. Superficial Great Saphenous Vein: No evidence of thrombus. Normal compressibility. Venous Reflux:  None. Other Findings:  None. IMPRESSION: No evidence of left lower extremity deep venous thrombosis. Electronically Signed   By: Davina Poke D.O.   On: 12/18/2021 14:38   DG Chest 2 View  Result Date: 12/18/2021 CLINICAL DATA:  Fall, weakness EXAM: CHEST - 2 VIEW COMPARISON:  None Available. FINDINGS: Patient rotation. No consolidation. No visible pleural effusions or pneumothorax. Cardiomediastinal silhouette is within normal limits when accounting for patient rotation. Mildly displaced right lateral seventh rib fracture IMPRESSION: 1. Mildly displaced right lateral seventh rib fracture, potentially acute. 2. No other acute abnormality identified. Electronically Signed   By:  Margaretha Sheffield M.D.   On: 12/18/2021 13:23   DG Pelvis 1-2 Views  Result Date: 12/18/2021 CLINICAL DATA:  Fall 2 days ago EXAM: PELVIS - 1-2 VIEW COMPARISON:  None Available. FINDINGS: Patient is rotated. There is no evidence of pelvic fracture or diastasis. No pelvic bone lesions are seen. Prominent atherosclerotic vascular calcifications. IMPRESSION: Negative. Electronically Signed   By: Davina Poke D.O.   On: 12/18/2021 13:22   CT Cervical Spine Wo Contrast  Result Date: 12/18/2021 CLINICAL DATA:  Neck trauma EXAM: CT CERVICAL SPINE WITHOUT CONTRAST TECHNIQUE: Multidetector CT imaging of  the cervical spine was performed without intravenous contrast. Multiplanar CT image reconstructions were also generated. RADIATION DOSE REDUCTION: This exam was performed according to the departmental dose-optimization program which includes automated exposure control, adjustment of the mA and/or kV according to patient size and/or use of iterative reconstruction technique. COMPARISON:  None Available. FINDINGS: Alignment: Grade 1 anterolisthesis of C6 on C7. No acute subluxation. Skull base and vertebrae: No acute fracture. No primary bone lesion or focal pathologic process. Soft tissues and spinal canal: No prevertebral fluid or swelling. No visible canal hematoma. Disc levels: Mild to moderate intervertebral disc height loss at C4-C5 and C5-C6 with accompanying endplate osteophytes and uncovertebral spurring. Bilateral mild facet arthropathy. Upper chest: No acute process identified. Other: Coarse calcified plaques noted in the right proximal ICA. IMPRESSION: No acute fracture or malalignment identified. Chronic findings as described. Electronically Signed   By: Ofilia Neas M.D.   On: 12/18/2021 13:07   CT Head Wo Contrast  Result Date: 12/18/2021 CLINICAL DATA:  Head trauma EXAM: CT HEAD WITHOUT CONTRAST TECHNIQUE: Contiguous axial images were obtained from the base of the skull through the vertex  without intravenous contrast. RADIATION DOSE REDUCTION: This exam was performed according to the departmental dose-optimization program which includes automated exposure control, adjustment of the mA and/or kV according to patient size and/or use of iterative reconstruction technique. COMPARISON:  None Available. FINDINGS: Brain: No acute intracranial hemorrhage, mass effect, or herniation. No extra-axial fluid collections. No evidence of acute territorial infarct. No hydrocephalus. Mild cortical volume loss. Mild patchy hypodensities in the periventricular and subcortical white matter, likely secondary to chronic microvascular ischemic changes. Vascular: Calcified plaques in the carotid siphons. Skull: Normal. Negative for fracture or focal lesion. Sinuses/Orbits: No acute finding. Other: None. IMPRESSION: Chronic changes with no acute intracranial process identified. Electronically Signed   By: Ofilia Neas M.D.   On: 12/18/2021 13:04      Assessment/Plan 1.  End-stage renal disease  Patient has begun to display uremic symptoms.  She does have acute kidney injury with hyponatremia and had baseline chronic kidney disease stage V.  Nephrology recommended PermCath placement to begin dialysis.  Discussed PermCath placement with patient and she is agreeable to proceed.  We will plan on PermCath placement today.  We will follow-up with patient on an outpatient basis for vein mapping to discuss permanent access.  Family Communication: No family at bedside   Kris Hartmann, NP [facility] [phone] (Office Phone) [phone] (Office Fax)  01/09/2022 10:26 AM    This note was created with Dragon medical transcription system.  Any error is purely unintentional

## 2022-01-09 NOTE — Interval H&P Note (Signed)
History and Physical Interval Note:  01/09/2022 3:17 PM  Theresa Warren  has presented today for surgery, with the diagnosis of ESRD.  The various methods of treatment have been discussed with the patient and family. After consideration of risks, benefits and other options for treatment, the patient has consented to  Procedure(s): DIALYSIS/PERMA CATHETER INSERTION (N/A) as a surgical intervention.  The patient's history has been reviewed, patient examined, no change in status, stable for surgery.  I have reviewed the patient's chart and labs.  Questions were answered to the patient's satisfaction.     Hortencia Pilar

## 2022-01-09 NOTE — Progress Notes (Signed)
Hemodialysis Post Treatment Note:  Tx date:01/09/2022 Tx time: 2 hours Access: Right CVC UF Removed: no fluid removal  Note: HD completed. Tolerated well. No complications. Patient asymptomatic.

## 2022-01-09 NOTE — Progress Notes (Addendum)
[facility]  ROUNDING NOTE   Subjective:   Theresa Warren is a 66 y.o. female with past medical history of hypertension, hyperlipidemia, diabetes, and chronic kidney disease stage V. Patient presents to ed for abnormal labs. She has been admitted for Hyponatremia [E87.1] AKI (acute kidney injury) ([place]) [N17.9] Symptomatic anemia [D64.9] Anemia, unspecified type [D64.9] Severe sepsis ([place]) [A41.9, R65.20]  Patient is known to our clinic from previous admissions. She is followed by Dr Arty Baumgartner in [place] [facility]. She states she recently was seen in office and advised to come to hospital for abnormal sodium labs. Patient states she was at [facility] receiving rehab, able to ambulate with assistance. She denies weakness, loss of appetite, or nausea and vomiting. Reports pain from sacral wound. Denies shortness of breath. States nephrology was preparing for vein mapping to prepare for dialysis in the future. She continues to make urine.    Update:  Patient seen resting quietly, somnolent Currently n.p.o. for scheduled procedure Lower extremity edema remains Able to answer simple questions however very drowsy.  BUN 123 Creatinine 4.73 Urine output 400 mL recorded overnight  Objective:  Vital signs in last 24 hours:  Temp:  [97.6 F (36.4 C)-100.2 F (37.9 C)] 100.2 F (37.9 C) (06/09 0817) Pulse Rate:  [47-96] 82 (06/09 0817) Resp:  [16-20] 20 (06/09 0817) BP: (103-145)/(46-77) 103/60 (06/09 0817) SpO2:  [84 %-95 %] 95 % (06/09 0817) Weight:  [106.5 kg] 106.5 kg (06/09 0500)  Weight change: 0.2 kg Filed Weights   01/07/22 0527 01/08/22 0445 01/09/22 0500  Weight: 101.7 kg 106.3 kg 106.5 kg    Intake/Output: I/O last 3 completed shifts: In: 892 [P.O.:435; I.V.:457] Out: 800 [Urine:800]   Intake/Output this shift:  No intake/output data recorded.  Physical Exam: General: Chronically ill-appearing, somnolent  Head: Normocephalic,  atraumatic. Moist oral mucosal membranes  Eyes: Anicteric  Lungs:  Clear to auscultation, normal effort, room air  Heart: Regular rhythm, tachycardia  Abdomen:  Soft, nontender, obese  Extremities: 1+ peripheral edema.  Neurologic: Oriented to self, bilateral upper extremity myoclonus  Skin: No acute rash, sacral wound with wound vac  Access: None    Basic Metabolic Panel: Recent Labs  Lab 01/03/22 0613 01/04/22 0618 01/05/22 0512 01/06/22 0532 01/07/22 0629 01/08/22 0941 01/09/22 0959  NA 135 133* 134* 134* 132* 134* 137  K 4.6 4.4 4.8 4.7 4.9 4.9 4.7  CL 105 105 102 99 96* 95* 100  CO2 19* 18* 20* 23 24 27 26   GLUCOSE 92 244* 193* 214* 215* 145* 87  BUN 94* 99* 98* 121* 120* 128* 123*  CREATININE 3.88* 3.98* 3.93* 4.18* 4.17* 4.36* 4.73*  CALCIUM 8.6* 9.1 10.0 10.3 10.2 10.1 9.2  MG 1.6* 2.1 2.0  --  1.8  --   --   PHOS  --   --   --   --   --  4.1 4.0     Liver Function Tests: Recent Labs  Lab 01/03/22 0613 01/05/22 0512 01/06/22 0532 01/08/22 0941 01/09/22 0959  AST 184* 125* 128*  --   --   ALT 93* 76* 75*  --   --   ALKPHOS 304* 286* 257*  --   --   BILITOT 0.6 0.5 0.6  --   --   PROT 5.8* 6.8 7.4  --   --   ALBUMIN 1.9* 3.0* 3.3* 2.9* 2.5*    No results for input(s): "LIPASE", "AMYLASE" in the last 168 hours. No results for  input(s): "AMMONIA" in the last 168 hours.  CBC: Recent Labs  Lab 01/05/22 0512 01/06/22 0532 01/07/22 0629 01/08/22 0941 01/09/22 0959  WBC 10.5 12.2* 10.4 11.6* 11.1*  HGB 8.4* 8.3* 9.1* 8.4* 8.1*  HCT 26.4* 25.7* 28.5* 26.1* 25.7*  MCV 92.3 92.8 93.4 93.9 93.1  PLT 393 395 435* 445* 396     Cardiac Enzymes: No results for input(s): "CKTOTAL", "CKMB", "CKMBINDEX", "TROPONINI" in the last 168 hours.  BNP: Invalid input(s): "POCBNP"  CBG: Recent Labs  Lab 01/08/22 1139 01/08/22 1614 01/08/22 2117 01/09/22 0634 01/09/22 0818  GLUCAP 129* 110* 139* 97 91     Microbiology: Results for orders placed or  performed during the hospital encounter of 12/31/21  Blood culture (routine x 2)     Status: None   Collection Time: 12/31/21 11:35 PM   Specimen: BLOOD  Result Value Ref Range Status   Specimen Description BLOOD RIGHT ARM  Final   Special Requests   Final    BOTTLES DRAWN AEROBIC AND ANAEROBIC Blood Culture adequate volume   Culture   Final    NO GROWTH 5 DAYS Performed at [facility], [address] [place], [place] [address]    Report Status 01/06/2022 FINAL  Final  Blood culture (routine x 2)     Status: None   Collection Time: 12/31/21 11:49 PM   Specimen: BLOOD  Result Value Ref Range Status   Specimen Description BLOOD RIGHT HAND  Final   Special Requests   Final    BOTTLES DRAWN AEROBIC AND ANAEROBIC Blood Culture adequate volume   Culture   Final    NO GROWTH 5 DAYS Performed at [facility], [address], [place], [place] [address]    Report Status 01/06/2022 FINAL  Final  MRSA Next Gen by PCR, Nasal     Status: None   Collection Time: 01/01/22 10:17 AM   Specimen: Nasal Mucosa; Nasal Swab  Result Value Ref Range Status   MRSA by PCR Next Gen NOT DETECTED NOT DETECTED Final    Comment: (NOTE) The GeneXpert MRSA Assay (FDA approved for NASAL specimens only), is one component of a comprehensive MRSA colonization surveillance program. It is not intended to diagnose MRSA infection nor to guide or monitor treatment for MRSA infections. Test performance is not FDA approved in patients less than 17 years old. Performed at [facility], [place], [place] [address]   Aerobic/Anaerobic Culture w Gram Stain (surgical/deep wound)     Status: None   Collection Time: 01/01/22  3:37 PM   Specimen: PATH Soft tissue  Result Value Ref Range Status   Specimen Description TISSUE  Final   Special Requests BUTTOCKS WOUND  Final   Gram Stain   Final    NO SQUAMOUS EPITHELIAL CELLS SEEN FEW WBC SEEN RARE GRAM POSITIVE RODS FEW GRAM  POSITIVE COCCI    Culture   Final    FEW STAPHYLOCOCCUS AUREUS RARE ENTEROCOCCUS FAECALIS NO ANAEROBES ISOLATED Performed at [facility], [address], [place], [place] [address]    Report Status 01/06/2022 FINAL  Final   Organism ID, Bacteria STAPHYLOCOCCUS AUREUS  Final   Organism ID, Bacteria ENTEROCOCCUS FAECALIS  Final      Susceptibility   Enterococcus faecalis - MIC*    AMPICILLIN <=2 SENSITIVE Sensitive     VANCOMYCIN 1 SENSITIVE Sensitive     GENTAMICIN SYNERGY SENSITIVE Sensitive     * RARE ENTEROCOCCUS FAECALIS   Staphylococcus aureus -  MIC*    CIPROFLOXACIN >=8 RESISTANT Resistant     ERYTHROMYCIN >=8 RESISTANT Resistant     GENTAMICIN <=0.5 SENSITIVE Sensitive     OXACILLIN <=0.25 SENSITIVE Sensitive     TETRACYCLINE <=1 SENSITIVE Sensitive     VANCOMYCIN 1 SENSITIVE Sensitive     TRIMETH/SULFA <=10 SENSITIVE Sensitive     CLINDAMYCIN >=8 RESISTANT Resistant     RIFAMPIN <=0.5 SENSITIVE Sensitive     Inducible Clindamycin NEGATIVE Sensitive     * FEW STAPHYLOCOCCUS AUREUS  Aerobic/Anaerobic Culture w Gram Stain (surgical/deep wound)     Status: None   Collection Time: 01/01/22  3:46 PM   Specimen: PATH Soft tissue  Result Value Ref Range Status   Specimen Description TISSUE  Final   Special Requests BUTTOCKS WOUND NO 2  Final   Gram Stain   Final    NO SQUAMOUS EPITHELIAL CELLS SEEN FEW WBC SEEN FEW GRAM POSITIVE COCCI    Culture   Final    MODERATE STAPHYLOCOCCUS AUREUS FEW STAPHYLOCOCCUS EPIDERMIDIS NO ANAEROBES ISOLATED Performed at [facility], [place] [address], [place], [place] [address]    Report Status 01/06/2022 FINAL  Final   Organism ID, Bacteria STAPHYLOCOCCUS AUREUS  Final   Organism ID, Bacteria STAPHYLOCOCCUS EPIDERMIDIS  Final      Susceptibility   Staphylococcus aureus - MIC*    CIPROFLOXACIN >=8 RESISTANT Resistant     ERYTHROMYCIN >=8 RESISTANT Resistant     GENTAMICIN <=0.5 SENSITIVE Sensitive     OXACILLIN <=0.25  SENSITIVE Sensitive     TETRACYCLINE <=1 SENSITIVE Sensitive     VANCOMYCIN 1 SENSITIVE Sensitive     TRIMETH/SULFA <=10 SENSITIVE Sensitive     CLINDAMYCIN >=8 RESISTANT Resistant     RIFAMPIN <=0.5 SENSITIVE Sensitive     Inducible Clindamycin NEGATIVE Sensitive     * MODERATE STAPHYLOCOCCUS AUREUS   Staphylococcus epidermidis - MIC*    CIPROFLOXACIN 4 RESISTANT Resistant     ERYTHROMYCIN >=8 RESISTANT Resistant     GENTAMICIN <=0.5 SENSITIVE Sensitive     OXACILLIN >=4 RESISTANT Resistant     TETRACYCLINE 2 SENSITIVE Sensitive     VANCOMYCIN 1 SENSITIVE Sensitive     TRIMETH/SULFA <=10 SENSITIVE Sensitive     CLINDAMYCIN >=8 RESISTANT Resistant     RIFAMPIN <=0.5 SENSITIVE Sensitive     Inducible Clindamycin NEGATIVE Sensitive     * FEW STAPHYLOCOCCUS EPIDERMIDIS    Coagulation Studies: No results for input(s): "LABPROT", "INR" in the last 72 hours.   Urinalysis: No results for input(s): "COLORURINE", "LABSPEC", "PHURINE", "GLUCOSEU", "HGBUR", "BILIRUBINUR", "KETONESUR", "PROTEINUR", "UROBILINOGEN", "NITRITE", "LEUKOCYTESUR" in the last 72 hours.  Invalid input(s): "APPERANCEUR"     Imaging: No results found.   Medications:    sodium chloride 75 mL/hr at 01/09/22 3500     vitamin C  500 mg Oral BID   atorvastatin  40 mg Oral Daily   cephALEXin  250 mg Oral Q8H   Chlorhexidine Gluconate Cloth  6 each Topical Q0600   fluticasone  2 spray Each Nare Daily   furosemide  40 mg Oral BID   gabapentin  100 mg Oral TID   insulin aspart  0-5 Units Subcutaneous QHS   insulin aspart  0-9 Units Subcutaneous TID WC   insulin aspart  3 Units Subcutaneous TID WC   insulin glargine-yfgn  20 Units Subcutaneous BID   loratadine  10 mg Oral Daily   metoprolol succinate  50 mg Oral BID   multivitamin with minerals  1 tablet Oral Daily   nutrition supplement (JUVEN)  1 packet Oral BID BM   Ensure Max Protein  11 oz Oral QHS   sodium bicarbonate  1,300 mg Oral BID   zinc sulfate   220 mg Oral Daily   alteplase, heparin, hydrALAZINE, lidocaine (PF), lidocaine-prilocaine, morphine injection, ondansetron **OR** ondansetron (ZOFRAN) IV, oxyCODONE, pentafluoroprop-tetrafluoroeth  Assessment/ Plan:  Ms. Nikiyah Fackler is a 66 y.o.  female with past medical history of hypertension, hyperlipidemia, diabetes, and chronic kidney disease stage V. Patient presents to ed for abnormal labs. She has been admitted for Hyponatremia [E87.1] AKI (acute kidney injury) ([place]) [N17.9] Symptomatic anemia [D64.9] Anemia, unspecified type [D64.9] Severe sepsis ([place]) [A41.9, R65.20]   End-stage renal disease requiring hemodialysis.  Due to lack of sustainable renal recovery and decline in mental status due to elevated urea, we feel patient is now turned to end-stage renal disease. Chronic kidney disease is secondary to diabetes and hypertension  Overall renal function continues to deteriorate.  BUN elevated at 123.  Uremic symptoms present, confusion, muscle jerking, somnolence.  Patient has agreed to initiate dialysis.  Consulted vascular for placement of PermCath later today.  Patient will receive first dialysis treatment after placement.  Renal navigator aware of patient and outpatient clinic search in progress.  Next dialysis treatment tomorrow.  Lab Results  Component Value Date   CREATININE 4.73 (H) 01/09/2022   CREATININE 4.36 (H) 01/08/2022   CREATININE 4.17 (H) 01/07/2022    Intake/Output Summary (Last 24 hours) at 01/09/2022 1134 Last data filed at 01/09/2022 0608 Gross per 24 hour  Intake 892.01 ml  Output 400 ml  Net 492.01 ml    2. Anemia of chronic kidney disease Lab Results  Component Value Date   HGB 8.1 (L) 01/09/2022  Hemoglobin remains below target.  Will initiate EPO 4000 units IV with dialysis treatments.  3. Secondary Hyperparathyroidism:  Lab Results  Component Value Date   CALCIUM 9.2 01/09/2022   CAION 1.11 (L) 01/01/2022   PHOS 4.0 01/09/2022   We  will continue to monitor during this admission.  4.  Acute metabolic acidosis.  Resolved with oral supplementation.  5.  Generalized edema.  Improved with oral diuresis.   LOS: Paul 6/9/202311:34 AM

## 2022-01-09 NOTE — Progress Notes (Signed)
Theresa Warren Hospital Day(s): 8.   Post op day(s): 8 Days Post-Op.   Interval History:  Patient seen and examined No acute events or new complaints overnight.  Still with pain in buttock, worse with dressing changes No fever, chills Again, Cx staph aureus and epidermidis; sensitivities reviewed. She is on Keflex (started 06/05) PT/OT notes reviewed; recommendations SNF  Vital signs in last 24 hours: [min-max] current  Temp:  [97.6 F (36.4 C)-100.2 F (37.9 C)] 100.2 F (37.9 C) (06/09 0817) Pulse Rate:  [47-96] 82 (06/09 0817) Resp:  [16-20] 20 (06/09 0817) BP: (103-145)/(46-77) 103/60 (06/09 0817) SpO2:  [84 %-95 %] 95 % (06/09 0817) Weight:  [106.5 kg] 106.5 kg (06/09 0500)     Height: 5\' 3"  (160 cm) Weight: 106.5 kg BMI (Calculated): 41.6   Intake/Output last 2 shifts:  06/08 0701 - 06/09 0700 In: 892 [P.O.:435; I.V.:457] Out: 400 [Urine:400]   Physical Exam:  Constitutional: alert, cooperative and no distress  Respiratory: breathing non-labored at rest  Cardiovascular: regular rate and sinus rhythm  Integumentary: Wound vac to the bilateral buttock, bridged to the left anterior hip. Removed. She has stage 3 wound to the left lateral buttock, healthy appearing, some slough on the edges but stable compared to Monday. There is right and left medial gluteal wounds as well, these appear healthy (stage 3), there are smaller surrounding pressure injuries with slough, no evidence of necrosis or infection. Wound vac changed with RN.   Left Lateral Buttock Wound (01/09/2022):    Bilateral gluteal wounds (01/09/2022):      Labs:     Latest Ref Rng & Units 01/09/2022    9:59 AM 01/08/2022    9:41 AM 01/07/2022    6:29 AM  CBC  WBC 4.0 - 10.5 K/uL 11.1  11.6  10.4   Hemoglobin 12.0 - 15.0 g/dL 8.1  8.4  9.1   Hematocrit 36.0 - 46.0 % 25.7  26.1  28.5   Platelets 150 - 400 K/uL 396  445  435       Latest Ref Rng & Units 01/08/2022     9:41 AM 01/07/2022    6:29 AM 01/06/2022    5:32 AM  CMP  Glucose 70 - 99 mg/dL 145  215  214   BUN 8 - 23 mg/dL 128  120  121   Creatinine 0.44 - 1.00 mg/dL 4.36  4.17  4.18   Sodium 135 - 145 mmol/L 134  132  134   Potassium 3.5 - 5.1 mmol/L 4.9  4.9  4.7   Chloride 98 - 111 mmol/L 95  96  99   CO2 22 - 32 mmol/L 27  24  23    Calcium 8.9 - 10.3 mg/dL 10.1  10.2  10.3   Total Protein 6.5 - 8.1 g/dL   7.4   Total Bilirubin 0.3 - 1.2 mg/dL   0.6   Alkaline Phos 38 - 126 U/L   257   AST 15 - 41 U/L   128   ALT 0 - 44 U/L   75      Imaging studies: No new pertinent imaging studies   Assessment/Plan: 66 y.o. female 8 Days Post-Op s/p debridement of three decubitus wounds of bilateral buttocks (total area 133 cm2) and placement of negative pressure dressing (> 50 cm).    - Continue wound vac; change MWF; [place] RN on-board to assist with changes starting today, greatly appreciate their assistance. I will be available  to assist if needed  - No further surgical intervention at this time. I do worry that her immobility, poor functional status, and poor nutritional status will be significant barrier to wound healing. She has smaller pressure injuries with slough around her debrided wounds. Will follow these closely to ensure no worsening. No evidence of necrosis nor infection on examination today.   - Continue Abx (Keflex); cs reviewed            - Local wound care; pressure offload, frequent repositioning, low air loss mattress.Marland KitchenMarland KitchenShe had low air loss mattress at bedside last week, apparently this failed mechanically. Would attempt again.            - Pain control prn           - Further management per primary service   - General surgery will sign off; wounds stable. Would not recommend further debridement at this time. Healing will be very challenging given her immobility, poor functional status, and poor nutritional status. We will be available as needed.   All of the above findings and  recommendations were discussed with the patient, and the medical team, and all of patient's questions were answered to her expressed satisfaction.  -- Edison Simon, PA-C [facility] 01/09/2022, 10:38 AM M-F: 7am - 4pm

## 2022-01-09 NOTE — Progress Notes (Addendum)
Theresa Warren started to sign consent for PermCath placement today. Tremors were making it challenging to sign however due to paranoia about a conspiracy theory she only provided half a signature and is requesting more information before she finishes her signature on the consent form. Writer was unable to identify what the conspiracy theory is/was as Theresa Warren did not provide more information, she became irritable when questions were asked.

## 2022-01-09 NOTE — Progress Notes (Signed)
PROGRESS NOTE    Theresa Warren  NUU:[id number] DOB: [birth year] DOA: 12/31/2021 PCP: Crist Infante, MD    Brief Narrative:  [name] is a 66 year old female hyperlipidemia, hypertension, CKD stage V, insulin-dependent diabetes mellitus, who presents emergency department from [facility] for evaluation of low sodium noted on labs at the facility.  She was discharged to rehab on 5/23 after being admitted following a fall and also with acute on chronic anemia.   ED Course - temp 99.6, HR 107, otherwise unremarkable vitals. Labs with sodium 129, potassium 5.1, chloride 99, bicarb 17, BUN of 100, serum creatinine of 4.61, GFR of 10, WBC 18.4, hemoglobin 6.9, platelets of 473.  LFT's were also elevated.   Patient met criteria for sepsis on arrival, treated per protocol in the ED with IV Cefepime, Flagyl, Vanc, and LR 1 L bolus fluids.  Infection source unclear but felt possibly due to sacral wound infection and concern for underlying osteomyelitis.   Admitted to the hospitalist service for further evaluation and management.  Continued on IV Cefepime and Vanc.   General surgery was consulted for debridement on sacral pressure injury.  Nephrology consulted due to worsening renal function  6/7 complaining of pain where the wound VAC is after being changed today no other complaints. 6/8 discussed with pt about SNF as she lives alone and is weak.she will reconsider it. Messaged case mx to discuss options. 6/9 confused and mildly lethargic this am.plan for HD today as displaying uremic sx.  Consultants:  Surgery  Procedures:   Antimicrobials:  Keflex   Subjective: Drawsy am. confused  Objective: Vitals:   01/08/22 2130 01/09/22 0018 01/09/22 0443 01/09/22 0500  BP:  (!) 130/49 (!) 142/64   Pulse:  84 (!) 47   Resp:  16 19   Temp:  98.1 F (36.7 C) 98.6 F (37 C)   TempSrc:      SpO2: 92% 95% 93%   Weight:    106.5 kg  Height:        Intake/Output Summary  (Last 24 hours) at 01/09/2022 0800 Last data filed at 01/09/2022 3474 Gross per 24 hour  Intake 892.01 ml  Output 400 ml  Net 492.01 ml   Filed Weights   01/07/22 0527 01/08/22 0445 01/09/22 0500  Weight: 101.7 kg 106.3 kg 106.5 kg    Examination: Calm, NAD Cta no w/r Reg s1/s2 no gallop Soft benign +bs Mild edema Lethargic  Mood and affect appropriate in current setting     Data Reviewed: I have personally reviewed following labs and imaging studies  CBC: Recent Labs  Lab 01/04/22 0618 01/05/22 0512 01/06/22 0532 01/07/22 0629 01/08/22 0941  WBC 11.5* 10.5 12.2* 10.4 11.6*  HGB 7.9* 8.4* 8.3* 9.1* 8.4*  HCT 23.9* 26.4* 25.7* 28.5* 26.1*  MCV 91.2 92.3 92.8 93.4 93.9  PLT 347 393 395 435* 259*   Basic Metabolic Panel: Recent Labs  Lab 01/03/22 0613 01/04/22 0618 01/05/22 0512 01/06/22 0532 01/07/22 0629 01/08/22 0941  NA 135 133* 134* 134* 132* 134*  K 4.6 4.4 4.8 4.7 4.9 4.9  CL 105 105 102 99 96* 95*  CO2 19* 18* 20* 23 24 27   GLUCOSE 92 244* 193* 214* 215* 145*  BUN 94* 99* 98* 121* 120* 128*  CREATININE 3.88* 3.98* 3.93* 4.18* 4.17* 4.36*  CALCIUM 8.6* 9.1 10.0 10.3 10.2 10.1  MG 1.6* 2.1 2.0  --  1.8  --   PHOS  --   --   --   --   --  4.1   GFR: Estimated Creatinine Clearance: 15.2 mL/min (A) (by C-G formula based on SCr of 4.36 mg/dL (H)). Liver Function Tests: Recent Labs  Lab 01/03/22 0613 01/05/22 0512 01/06/22 0532 01/08/22 0941  AST 184* 125* 128*  --   ALT 93* 76* 75*  --   ALKPHOS 304* 286* 257*  --   BILITOT 0.6 0.5 0.6  --   PROT 5.8* 6.8 7.4  --   ALBUMIN 1.9* 3.0* 3.3* 2.9*   No results for input(s): "LIPASE", "AMYLASE" in the last 168 hours. No results for input(s): "AMMONIA" in the last 168 hours. Coagulation Profile: No results for input(s): "INR", "PROTIME" in the last 168 hours.  Cardiac Enzymes: No results for input(s): "CKTOTAL", "CKMB", "CKMBINDEX", "TROPONINI" in the last 168 hours. BNP (last 3 results) No  results for input(s): "PROBNP" in the last 8760 hours. HbA1C: No results for input(s): "HGBA1C" in the last 72 hours. CBG: Recent Labs  Lab 01/08/22 0752 01/08/22 1139 01/08/22 1614 01/08/22 2117 01/09/22 0634  GLUCAP 139* 129* 110* 139* 97   Lipid Profile: No results for input(s): "CHOL", "HDL", "LDLCALC", "TRIG", "CHOLHDL", "LDLDIRECT" in the last 72 hours. Thyroid Function Tests: No results for input(s): "TSH", "T4TOTAL", "FREET4", "T3FREE", "THYROIDAB" in the last 72 hours. Anemia Panel: No results for input(s): "VITAMINB12", "FOLATE", "FERRITIN", "TIBC", "IRON", "RETICCTPCT" in the last 72 hours. Sepsis Labs: No results for input(s): "PROCALCITON", "LATICACIDVEN" in the last 168 hours.   Recent Results (from the past 240 hour(s))  Blood culture (routine x 2)     Status: None   Collection Time: 12/31/21 11:35 PM   Specimen: BLOOD  Result Value Ref Range Status   Specimen Description BLOOD RIGHT ARM  Final   Special Requests   Final    BOTTLES DRAWN AEROBIC AND ANAEROBIC Blood Culture adequate volume   Culture   Final    NO GROWTH 5 DAYS Performed at [facility], [address], [place], [place] [address]    Report Status 01/06/2022 FINAL  Final  Blood culture (routine x 2)     Status: None   Collection Time: 12/31/21 11:49 PM   Specimen: BLOOD  Result Value Ref Range Status   Specimen Description BLOOD RIGHT HAND  Final   Special Requests   Final    BOTTLES DRAWN AEROBIC AND ANAEROBIC Blood Culture adequate volume   Culture   Final    NO GROWTH 5 DAYS Performed at [facility], [address], [place], [place] [address]    Report Status 01/06/2022 FINAL  Final  MRSA Next Gen by PCR, Nasal     Status: None   Collection Time: 01/01/22 10:17 AM   Specimen: Nasal Mucosa; Nasal Swab  Result Value Ref Range Status   MRSA by PCR Next Gen NOT DETECTED NOT DETECTED Final    Comment: (NOTE) The GeneXpert MRSA Assay (FDA approved for NASAL specimens  only), is one component of a comprehensive MRSA colonization surveillance program. It is not intended to diagnose MRSA infection nor to guide or monitor treatment for MRSA infections. Test performance is not FDA approved in patients less than 47 years old. Performed at [facility], [place], [place] [address]   Aerobic/Anaerobic Culture w Gram Stain (surgical/deep wound)     Status: None   Collection Time: 01/01/22  3:37 PM   Specimen: PATH Soft tissue  Result Value Ref Range Status   Specimen Description TISSUE  Final   Special Requests BUTTOCKS WOUND  Final   Gram Stain   Final    NO SQUAMOUS EPITHELIAL CELLS SEEN FEW WBC SEEN RARE GRAM POSITIVE RODS FEW GRAM POSITIVE COCCI    Culture   Final    FEW STAPHYLOCOCCUS AUREUS RARE ENTEROCOCCUS FAECALIS NO ANAEROBES ISOLATED Performed at [facility], [address] [place], [place] [address]    Report Status 01/06/2022 FINAL  Final   Organism ID, Bacteria STAPHYLOCOCCUS AUREUS  Final   Organism ID, Bacteria ENTEROCOCCUS FAECALIS  Final      Susceptibility   Enterococcus faecalis - MIC*    AMPICILLIN <=2 SENSITIVE Sensitive     VANCOMYCIN 1 SENSITIVE Sensitive     GENTAMICIN SYNERGY SENSITIVE Sensitive     * RARE ENTEROCOCCUS FAECALIS   Staphylococcus aureus - MIC*    CIPROFLOXACIN >=8 RESISTANT Resistant     ERYTHROMYCIN >=8 RESISTANT Resistant     GENTAMICIN <=0.5 SENSITIVE Sensitive     OXACILLIN <=0.25 SENSITIVE Sensitive     TETRACYCLINE <=1 SENSITIVE Sensitive     VANCOMYCIN 1 SENSITIVE Sensitive     TRIMETH/SULFA <=10 SENSITIVE Sensitive     CLINDAMYCIN >=8 RESISTANT Resistant     RIFAMPIN <=0.5 SENSITIVE Sensitive     Inducible Clindamycin NEGATIVE Sensitive     * FEW STAPHYLOCOCCUS AUREUS  Aerobic/Anaerobic Culture w Gram Stain (surgical/deep wound)     Status: None   Collection Time: 01/01/22  3:46 PM   Specimen: PATH Soft tissue  Result Value Ref Range Status   Specimen  Description TISSUE  Final   Special Requests BUTTOCKS WOUND NO 2  Final   Gram Stain   Final    NO SQUAMOUS EPITHELIAL CELLS SEEN FEW WBC SEEN FEW GRAM POSITIVE COCCI    Culture   Final    MODERATE STAPHYLOCOCCUS AUREUS FEW STAPHYLOCOCCUS EPIDERMIDIS NO ANAEROBES ISOLATED Performed at [facility], [address], [place], [place] [address]    Report Status 01/06/2022 FINAL  Final   Organism ID, Bacteria STAPHYLOCOCCUS AUREUS  Final   Organism ID, Bacteria STAPHYLOCOCCUS EPIDERMIDIS  Final      Susceptibility   Staphylococcus aureus - MIC*    CIPROFLOXACIN >=8 RESISTANT Resistant     ERYTHROMYCIN >=8 RESISTANT Resistant     GENTAMICIN <=0.5 SENSITIVE Sensitive     OXACILLIN <=0.25 SENSITIVE Sensitive     TETRACYCLINE <=1 SENSITIVE Sensitive     VANCOMYCIN 1 SENSITIVE Sensitive     TRIMETH/SULFA <=10 SENSITIVE Sensitive     CLINDAMYCIN >=8 RESISTANT Resistant     RIFAMPIN <=0.5 SENSITIVE Sensitive     Inducible Clindamycin NEGATIVE Sensitive     * MODERATE STAPHYLOCOCCUS AUREUS   Staphylococcus epidermidis - MIC*    CIPROFLOXACIN 4 RESISTANT Resistant     ERYTHROMYCIN >=8 RESISTANT Resistant     GENTAMICIN <=0.5 SENSITIVE Sensitive     OXACILLIN >=4 RESISTANT Resistant     TETRACYCLINE 2 SENSITIVE Sensitive     VANCOMYCIN 1 SENSITIVE Sensitive     TRIMETH/SULFA <=10 SENSITIVE Sensitive     CLINDAMYCIN >=8 RESISTANT Resistant     RIFAMPIN <=0.5 SENSITIVE Sensitive     Inducible Clindamycin NEGATIVE Sensitive     * FEW STAPHYLOCOCCUS EPIDERMIDIS         Radiology Studies: No results found.      Scheduled Meds:  vitamin C  500 mg Oral BID   atorvastatin  40 mg Oral Daily   cephALEXin  250 mg Oral Q8H   Chlorhexidine Gluconate Cloth  6 each Topical  Q0600   fluticasone  2 spray Each Nare Daily   furosemide  40 mg Oral BID   gabapentin  100 mg Oral TID   insulin aspart  0-5 Units Subcutaneous QHS   insulin aspart  0-9 Units Subcutaneous TID WC   insulin  aspart  7 Units Subcutaneous TID WC   insulin glargine-yfgn  25 Units Subcutaneous BID   loratadine  10 mg Oral Daily   metoprolol succinate  50 mg Oral BID   multivitamin with minerals  1 tablet Oral Daily   nutrition supplement (JUVEN)  1 packet Oral BID BM   Ensure Max Protein  11 oz Oral QHS   sodium bicarbonate  1,300 mg Oral BID   zinc sulfate  220 mg Oral Daily   Continuous Infusions:  sodium chloride 75 mL/hr at 01/09/22 0608    Assessment & Plan:   Principal Problem:   Severe sepsis ([place]) Active Problems:   Chronic kidney disease, stage 5 ([place])   Essential hypertension   DNR (do not resuscitate)/DNI(Do Not Intubate)   Type 2 diabetes mellitus with chronic kidney disease, with long-term current use of insulin ([place])   Sacral wound, initial encounter   Swelling of left lower extremity   Abdominal pain   AKI (acute kidney injury) ([place])   Hypomagnesemia   Symptomatic anemia   Hyponatremia   Premature atrial complexes   OSA (obstructive sleep apnea)   Pressure injury of skin   Elevated LFTs   Anasarca associated with disorder of kidney    * Severe sepsis (HCC) POA as evidenced by tachycardia, leukocytosis, AKI and transaminitis consistent with organ dysfunction and severe sepsis with suspected infection source being sacral wounds versus an abdominal source (CT abdomen pelvis negative for anything acute however).  MRSA PCR negative. Wound cultures grew few Staph aureus, rare Enterococcus faecalis. Initially treated with vancomycin/cefepime. Vanc stopped 6/4. Plan is to treat MSSA in both cultures.  Enterococcus likely contaminated from stool d/T location of wounds and MRSE likely colonization of skin  6/9 continue with Keflex    Sacral Decubitus wound Continue abx as above Gsx following, felt wounds stable and signed off.  S/p debridement of three decubitus wounds of b/l buttocks and wound vac placed No further debridment at this time. Healing challenging given  her immobility , poor functional status, and poor nutritional status. Continue wound vac : change MWF; WOC RN on board to assist with changes starting today. Local wound care; pressure offload, freq. Repositioning, low air loss mattress.  Pain control prn   Type 2 diabetes mellitus with chronic kidney disease, with long-term current use of insulin (HCC) Currently npo, bg on low side Holding am levemir since npo Change levemir to 20units bid  Decrease novolog to 3units with mealsh    Essential hypertension Losartan held due to AKI. On p.o. Lasix started by nephrology  IV hydralazine as needed  6/9 bp on low side, starting HD today, will hold am bp meds     AKI on Chronic kidney disease, stage 5 (HCC) Hyponatremia Treated IV Lasix and albumin per nephrology.  6/7 bilateral lower extremity improving AKI secondary to underlying illness  CKD secondary to diabetes and hypertension  No exposure to IV contrast  CT abdomen pelvis negative for obstruction, several renal stones identified.   Losartan held due to AKI  Now switched to Lasix p.o. 40 mg twice daily  6/8 albumin was  stopped 6/9 exhibiting uremic symptoms today.  Vascular consulted for perm cath placement, will  be done today Plan for HD today. Will need outpatient HD placement.        Abdominal pain Patient reported on admission.  CT of abdomen pelvis showed abnormal endometrial thickening for a postmenopausal patient, bladder distention estimated 520 mL, no other acute or inflammatory processes or acute findings. --Monitor clinically for now -- Needs OB/GYN follow-up for endometrial sampling to exclude endometrial carcinoma    Swelling of left lower extremity With redness, patient attributes it to clindamycin.  She reported on admission that this has been present for over one month.   Left lower extremity Doppler US on 12/18/21 was negative for DVT.       Symptomatic anemia Hemoglobin on admission was 6.9 >>  6.3, down from 7.6 at time of discharge on 5/23.  No signs of active bleeding, did have vaginal/endometrial bleeding previous admission but no reports of this recently.  May have slow chronic blood loss from sacral wounds.  Has baseline anemia of chronic disease due to renal failure. Received 1 unit PRBC transfusion on admission. -- Transfusing another unit RBCs today -- Trend hemoglobin and transfuse if less than 7   Hypomagnesemia Resolved with replacement on 6/3. Monitor and replace Mg as needed.     Premature atrial complexes Patient became significantly tachycardic on 6/2 with heart rates sustaining in the 130s.  Cardiology reviewed telemetry and feels patient dysrhythmias were not A-fib but sinus tach with PACs. --Cardiology following On beta blk     Elevated LFTs On admission, alk phos 212, AST 182, ALT 107.  Unclear etiology, repeat LFTs not available today.  Appears LFTs were elevated at time of discharge on 5/23 but have increased since. RUQ ultrasound unremarkable. Acute hepatitis panel negative.  Pressure injury of skin Present on admission.  Patient has multiple sacral pressure injuries.  Some concern at time of admission that these were the source of sepsis. General surgery consulted and took patient to the OR on 6/1 for debridement.   Now with wound VAC. --Initial wound VAC change Monday 6/5 then every M/W/F -- Wound care consulted -- Frequent repositioning --Follow general surgery recommendations --Pain control --Follow blood cultures Pressure Injury 01/01/22 Buttocks Left Stage 3 -  Full thickness tissue loss. Subcutaneous fat may be visible but bone, tendon or muscle are NOT exposed. (Active)  01/01/22 0222  Location: Buttocks  Location Orientation: Left  Staging: Stage 3 -  Full thickness tissue loss. Subcutaneous fat may be visible but bone, tendon or muscle are NOT exposed.  Wound Description (Comments):   Present on Admission: Yes     Pressure Injury  01/01/22 Buttocks Right Stage 2 -  Partial thickness loss of dermis presenting as a shallow open injury with a red, pink wound bed without slough. (Active)  01/01/22 0223  Location: Buttocks  Location Orientation: Right  Staging: Stage 2 -  Partial thickness loss of dermis presenting as a shallow open injury with a red, pink wound bed without slough.  Wound Description (Comments):   Present on Admission: Yes     Pressure Injury 01/01/22 Hip Left Stage 2 -  Partial thickness loss of dermis presenting as a shallow open injury with a red, pink wound bed without slough. (Active)  01/01/22 0225  Location: Hip  Location Orientation: Left  Staging: Stage 2 -  Partial thickness loss of dermis presenting as a shallow open injury with a red, pink wound bed without slough.  Wound Description (Comments):   Present on Admission: Yes     Pressure  Injury 01/01/22 Hip Right Stage 3 -  Full thickness tissue loss. Subcutaneous fat may be visible but bone, tendon or muscle are NOT exposed. (Active)  01/01/22 0225  Location: Hip  Location Orientation: Right  Staging: Stage 3 -  Full thickness tissue loss. Subcutaneous fat may be visible but bone, tendon or muscle are NOT exposed.  Wound Description (Comments):   Present on Admission: Yes          OSA (obstructive sleep apnea) .  Appears not to be on CPAP       DVT prophylaxis: SCD Code Status: DNR Family Communication: None at bedside Disposition Plan:  Status is: Inpatient Remains inpatient appropriate because: renal function worsening, plan for permcath for HD, will start HD and will need HD outpatient placement. Also unsafe discharge, reconsidering snf.       LOS: 8 days   Time spent 35 minutes with more than 50% on [place], MD [facility] Pager [phone]  If 7PM-7AM, please contact night-coverage 01/09/2022, 8:00 AM

## 2022-01-09 NOTE — Consult Note (Addendum)
[facility] Nurse wound follow up Wound type: Routine NPWT dressing change Measurement:Per Wednesday's note by my associate, D. Barbie Haggis.  See also photo taken By Surgical PA-C, Susanne Greenhouse today. Patient with three (3) Stage 4 pressure injuries: 1 to left buttock, 1 to left hip and 1 to sacrum. Moist yellow tissue covering full thickness wounds surrounds debrided pressure injury wounds. These make maintaining an NPWT dressing seal challenging. Wound bed: Red with nonviable wound bed and periphery. Scattered full thickness wounds surround debrided wounds. Drainage (amount, consistency, odor): serous to serosanguinous in cannister and tubing. Periwound:As noted above Dressing procedure/placement/frequency: Five pieces of black foam are removed from wound and wounds then are cleansed with NS. Five pieces of black foam are used to obliterate dead space and bridge wounds together. Dressing is attached to 170mmHg continuous negative pressure and an immediate seal is achieved.  It is noted that patient is not on a mattress replacement with low air loss feature. This is to be reordered today.  Bilateral pressure redistribution heel boots are ordered for use while in bed. A pressure redistribution chair cushion is ordered for use while OOB in chair today.  Next dressing change is MONDAY, June 12.  [facility] nursing team will follow while in house, and  will remain available to this patient, the nursing and medical teams.    Thanks, Maudie Flakes, MSN, RN, [place], Letha [place], Luretha Rued, FAAN  Pager# [phone]

## 2022-01-09 NOTE — H&P (View-Only) (Signed)
[facility] Vascular Consult Note  MRN : [id number]  Theresa Warren is a 66 y.o. (1955/09/27) female who presents with chief complaint of  Chief Complaint  Patient presents with   abnormal labs  .   Consulting Physician:Shantelle Gwyneth Revels, NP Reason for consult: PermCath placement History of Present Illness: Theresa Warren is a 66 year old female with a history of hyperlipidemia, hypertension, chronic kidney disease stage V, and diabetes mellitus who presents to [facility] following low sodium.  She was found to have progressed with acute kidney injury and has begun to have uremic-like symptoms.  It was recommended by nephrology that she began dialysis.  Current Facility-Administered Medications  Medication Dose Route Frequency Provider Last Rate Last Admin   0.9 %  sodium chloride infusion   Intravenous Continuous Kris Hartmann, NP 75 mL/hr at 01/09/22 7619 Infusion Verify at 01/09/22 5093   alteplase (CATHFLO ACTIVASE) injection 2 mg  2 mg Intracatheter Once PRN Colon Flattery, NP       ascorbic acid (VITAMIN C) tablet 500 mg  500 mg Oral BID Piscoya, Jose, MD   500 mg at 01/08/22 2132   atorvastatin (LIPITOR) tablet 40 mg  40 mg Oral Daily Piscoya, Jose, MD   40 mg at 01/08/22 1001   cephALEXin (KEFLEX) capsule 250 mg  250 mg Oral Q8H Griffith, Kelly A, DO   250 mg at 01/09/22 0533   Chlorhexidine Gluconate Cloth 2 % PADS 6 each  6 each Topical Q0600 Colon Flattery, NP   6 each at 01/09/22 0533   fluticasone (FLONASE) 50 MCG/ACT nasal spray 2 spray  2 spray Each Nare Daily Piscoya, Jose, MD   2 spray at 01/08/22 1007   furosemide (LASIX) tablet 40 mg  40 mg Oral BID Colon Flattery, NP   40 mg at 01/08/22 1754   gabapentin (NEURONTIN) capsule 100 mg  100 mg Oral TID Colon Flattery, NP   100 mg at 01/08/22 2132   heparin injection 1,000 Units  1,000 Units Dialysis PRN Colon Flattery, NP       hydrALAZINE (APRESOLINE) tablet 25 mg  25 mg Oral  Q6H PRN Nicole Kindred A, DO       insulin aspart (novoLOG) injection 0-5 Units  0-5 Units Subcutaneous QHS Piscoya, Jacqulyn Bath, MD   3 Units at 01/06/22 2136   insulin aspart (novoLOG) injection 0-9 Units  0-9 Units Subcutaneous TID WC Piscoya, Jose, MD   1 Units at 01/08/22 1240   insulin aspart (novoLOG) injection 7 Units  7 Units Subcutaneous TID WC Nolberto Hanlon, MD   7 Units at 01/07/22 1640   insulin glargine-yfgn (SEMGLEE) injection 25 Units  25 Units Subcutaneous BID Nolberto Hanlon, MD   25 Units at 01/08/22 2156   lidocaine (PF) (XYLOCAINE) 1 % injection 5 mL  5 mL Intradermal PRN Colon Flattery, NP       lidocaine-prilocaine (EMLA) cream 1 application   1 application  Topical PRN Colon Flattery, NP       loratadine (CLARITIN) tablet 10 mg  10 mg Oral Daily Piscoya, Jose, MD   10 mg at 01/08/22 1001   metoprolol succinate (TOPROL-XL) 24 hr tablet 50 mg  50 mg Oral BID Geralynn Rile, MD   50 mg at 01/08/22 2132   morphine (PF) 2 MG/ML injection 2 mg  2 mg Intravenous Q4H PRN Olean Ree, MD   2 mg at 01/07/22 1642   multivitamin with minerals tablet 1 tablet  1 tablet Oral Daily Piscoya,  Jose, MD   1 tablet at 01/08/22 1003   nutrition supplement (JUVEN) (JUVEN) powder packet 1 packet  1 packet Oral BID BM Piscoya, Jose, MD   1 packet at 01/08/22 1316   ondansetron (ZOFRAN) tablet 4 mg  4 mg Oral Q6H PRN Olean Ree, MD       Or   ondansetron (ZOFRAN) injection 4 mg  4 mg Intravenous Q6H PRN Piscoya, Jose, MD   4 mg at 01/04/22 1556   oxyCODONE (Oxy IR/ROXICODONE) immediate release tablet 5-10 mg  5-10 mg Oral Q4H PRN Olean Ree, MD   10 mg at 01/08/22 1850   pentafluoroprop-tetrafluoroeth (GEBAUERS) aerosol 1 application   1 application  Topical PRN Colon Flattery, NP       protein supplement (ENSURE MAX) liquid  11 oz Oral QHS Piscoya, Jose, MD   11 oz at 01/08/22 2156   sodium bicarbonate tablet 1,300 mg  1,300 mg Oral BID Colon Flattery, NP   1,300 mg at 01/08/22  2132   zinc sulfate capsule 220 mg  220 mg Oral Daily Piscoya, Jacqulyn Bath, MD   220 mg at 01/08/22 1005    Past Medical History:  Diagnosis Date   CKD (chronic kidney disease), stage III ([place])    Diabetes ([place])    Hyperlipidemia    Hypertension    Obesity     Past Surgical History:  Procedure Laterality Date   ACHILLES TENDON REPAIR     ANKLE SURGERY     INCISION AND DRAINAGE ABSCESS Bilateral 01/01/2022   Procedure: INCISION AND DRAINAGE ABSCESS-Sacral Decubitus;  Surgeon: Olean Ree, MD;  Location: [facility] ORS;  Service: General;  Laterality: Bilateral;   URETHRAL STRICTURE DILATATION      Social History Social History   Tobacco Use   Smoking status: Never  Substance Use Topics   Alcohol use: Not Currently   Drug use: Not Currently    Family History Family History  Problem Relation Age of Onset   Alzheimer's disease Father    Diabetes Father    CAD Father 16   CAD Other        Multliple maternal aunts and uncles with early onset heart disease   Lung cancer Sister    ALS Mother     Allergies  Allergen Reactions   Codeine Nausea And Vomiting   Sulfa Antibiotics Swelling   Clindamycin/Lincomycin Rash   Penicillins Rash     REVIEW OF SYSTEMS (Negative unless checked)  Constitutional: [] Weight loss  [] Fever  [] Chills Cardiac: [] Chest pain   [] Chest pressure   [] Palpitations   [] Shortness of breath when laying flat   [] Shortness of breath at rest   [] Shortness of breath with exertion. Vascular:  [] Pain in legs with walking   [] Pain in legs at rest   [] Pain in legs when laying flat   [] Claudication   [] Pain in feet when walking  [] Pain in feet at rest  [] Pain in feet when laying flat   [] History of DVT   [] Phlebitis   [x] Swelling in legs   [] Varicose veins   [] Non-healing ulcers Pulmonary:   [] Uses home oxygen   [] Productive cough   [] Hemoptysis   [] Wheeze  [] COPD   [] Asthma Neurologic:  [] Dizziness  [] Blackouts   [] Seizures   [] History of stroke   [] History of TIA   [] Aphasia   [] Temporary blindness   [] Dysphagia   [] Weakness or numbness in arms   [] Weakness or numbness in legs Musculoskeletal:  [] Arthritis   [] Joint swelling   [] Joint  pain   [] Low back pain Hematologic:  [] Easy bruising  [] Easy bleeding   [] Hypercoagulable state   [] Anemic  [] Hepatitis Gastrointestinal:  [] Blood in stool   [] Vomiting blood  [] Gastroesophageal reflux/heartburn   [] Difficulty swallowing. Genitourinary:  [] Chronic kidney disease   [] Difficult urination  [] Frequent urination  [] Burning with urination   [] Blood in urine Skin:  [] Rashes   [] Ulcers   [] Wounds Psychological:  [] History of anxiety   []  History of major depression.  Physical Examination  Vitals:   01/09/22 0018 01/09/22 0443 01/09/22 0500 01/09/22 0817  BP: (!) 130/49 (!) 142/64  103/60  Pulse: 84 (!) 47  82  Resp: 16 19  20   Temp: 98.1 F (36.7 C) 98.6 F (37 C)  100.2 F (37.9 C)  TempSrc:      SpO2: 95% 93%  95%  Weight:   106.5 kg   Height:       Body mass index is 41.59 kg/m. Gen:  WD/WN, NAD Head: [place]/AT, No temporalis wasting. Prominent temp pulse not noted. Ear/Nose/Throat: Hearing grossly intact, nares w/o erythema or drainage, oropharynx w/o Erythema/Exudate Eyes: Sclera non-icteric, conjunctiva clear Neck: Trachea midline.  No JVD.  Pulmonary:  Good air movement, respirations not labored, equal bilaterally.  Cardiac: RRR, normal S1, S2. Vascular: 1+ edema Vessel Right Left  Radial Palpable Palpable   Gastrointestinal: soft, non-tender/non-distended. No guarding/reflex.  Musculoskeletal: M/S 5/5 throughout.  Extremities without ischemic changes.  No deformity or atrophy.  Neurologic: Sensation grossly intact in extremities.  Symmetrical.  Speech is fluent. Motor exam as listed above. Psychiatric: Judgment intact, Mood & affect appropriate for pt's clinical situation. Dermatologic: No rashes or ulcers noted.  No cellulitis or open wounds. Lymph : No Cervical, Axillary, or Inguinal  lymphadenopathy.    CBC Lab Results  Component Value Date   WBC 11.6 (H) 01/08/2022   HGB 8.4 (L) 01/08/2022   HCT 26.1 (L) 01/08/2022   MCV 93.9 01/08/2022   PLT 445 (H) 01/08/2022    BMET    Component Value Date/Time   NA 134 (L) 01/08/2022 0941   K 4.9 01/08/2022 0941   CL 95 (L) 01/08/2022 0941   CO2 27 01/08/2022 0941   GLUCOSE 145 (H) 01/08/2022 0941   BUN 128 (H) 01/08/2022 0941   CREATININE 4.36 (H) 01/08/2022 0941   CALCIUM 10.1 01/08/2022 0941   GFRNONAA 11 (L) 01/08/2022 0941   Estimated Creatinine Clearance: 15.2 mL/min (A) (by C-G formula based on SCr of 4.36 mg/dL (H)).  COAG Lab Results  Component Value Date   INR 1.4 (H) 12/31/2021    Radiology ECHOCARDIOGRAM COMPLETE  Result Date: 01/03/2022    ECHOCARDIOGRAM REPORT   Patient Name:   [facility] Date of Exam: 01/03/2022 Medical Rec #:  [id number]              Height:       63.0 in Accession #:    [id number]             Weight:       213.8 lb Date of Birth:  29-Mar-1956              BSA:          1.989 m Patient Age:    12 years               BP:           129/62 mmHg Patient Gender: F  HR:           88 bpm. Exam Location:  [facility] Procedure: 2D Echo Indications:     Atrial Fibrillation I48.91  History:         Patient has no prior history of Echocardiogram examinations.  Sonographer:     Kathlen Brunswick RDCS Referring Phys:  [id number] SHERI HAMMOCK Diagnosing Phys: Eleonore Chiquito MD IMPRESSIONS  1. Left ventricular ejection fraction, by estimation, is 60 to 65%. Left ventricular ejection fraction by 2D MOD biplane is 63.2 %. The left ventricle has normal function. The left ventricle has no regional wall motion abnormalities. There is mild asymmetric left ventricular hypertrophy of the basal-septal segment. Left ventricular diastolic parameters were normal.  2. Right ventricular systolic function is normal. The right ventricular size is normal. Tricuspid regurgitation signal is inadequate for  assessing PA pressure.  3. The mitral valve is grossly normal. Trivial mitral valve regurgitation. No evidence of mitral stenosis.  4. The aortic valve is tricuspid. There is mild calcification of the aortic valve. Aortic valve regurgitation is trivial. Aortic valve sclerosis is present, with no evidence of aortic valve stenosis.  5. The inferior vena cava is normal in size with greater than 50% respiratory variability, suggesting right atrial pressure of 3 mmHg. FINDINGS  Left Ventricle: Left ventricular ejection fraction, by estimation, is 60 to 65%. Left ventricular ejection fraction by 2D MOD biplane is 63.2 %. The left ventricle has normal function. The left ventricle has no regional wall motion abnormalities. The left ventricular internal cavity size was normal in size. There is mild asymmetric left ventricular hypertrophy of the basal-septal segment. Left ventricular diastolic parameters were normal. Right Ventricle: The right ventricular size is normal. No increase in right ventricular wall thickness. Right ventricular systolic function is normal. Tricuspid regurgitation signal is inadequate for assessing PA pressure. Left Atrium: Left atrial size was normal in size. Right Atrium: Right atrial size was normal in size. Pericardium: There is no evidence of pericardial effusion. Mitral Valve: The mitral valve is grossly normal. Trivial mitral valve regurgitation. No evidence of mitral valve stenosis. Tricuspid Valve: The tricuspid valve is grossly normal. Tricuspid valve regurgitation is not demonstrated. No evidence of tricuspid stenosis. Aortic Valve: The aortic valve is tricuspid. There is mild calcification of the aortic valve. Aortic valve regurgitation is trivial. Aortic valve sclerosis is present, with no evidence of aortic valve stenosis. Aortic valve peak gradient measures 7.8 mmHg. Pulmonic Valve: The pulmonic valve was grossly normal. Pulmonic valve regurgitation is not visualized. No evidence of  pulmonic stenosis. Aorta: The aortic root is normal in size and structure. Venous: The inferior vena cava is normal in size with greater than 50% respiratory variability, suggesting right atrial pressure of 3 mmHg. IAS/Shunts: The atrial septum is grossly normal.  LEFT VENTRICLE PLAX 2D                        Biplane EF (MOD) LVIDd:         3.45 cm         LV Biplane EF:   Left LVIDs:         2.37 cm                          ventricular LV PW:         1.43 cm  ejection LV IVS:        1.24 cm                          fraction by                                                 2D MOD                                                 biplane is LV Volumes (MOD)                                63.2 %. LV vol d, MOD    65.4 ml A2C:                           Diastology LV vol d, MOD    78.7 ml       LV e' medial:    7.72 cm/s A4C:                           LV E/e' medial:  13.6 LV vol s, MOD    24.5 ml       LV e' lateral:   8.16 cm/s A2C:                           LV E/e' lateral: 12.9 LV vol s, MOD    27.2 ml A4C: LV SV MOD A2C:   40.9 ml LV SV MOD A4C:   78.7 ml LV SV MOD BP:    45.4 ml RIGHT VENTRICLE RV Basal diam:  3.07 cm RV S prime:     16.00 cm/s TAPSE (M-mode): 2.2 cm LEFT ATRIUM             Index        RIGHT ATRIUM           Index LA diam:        4.20 cm 2.11 cm/m   RA Area:     10.30 cm LA Vol (A2C):   24.3 ml 12.21 ml/m  RA Volume:   20.60 ml  10.35 ml/m LA Vol (A4C):   33.9 ml 17.04 ml/m LA Biplane Vol: 30.7 ml 15.43 ml/m  AORTIC VALVE              PULMONIC VALVE AV Vmax:      140.00 cm/s PV Vmax:       1.05 m/s AV Peak Grad: 7.8 mmHg    PV Peak grad:  4.4 mmHg LVOT Vmax:    102.00 cm/s LVOT Vmean:   67.500 cm/s LVOT VTI:     0.241 m MITRAL VALVE MV Area (PHT): 4.06 cm     SHUNTS MV Decel Time: 187 msec     Systemic VTI: 0.24 m MV E velocity: 105.00 cm/s MV A velocity: 84.40 cm/s MV E/A ratio:  1.24 Eleonore Chiquito MD Electronically signed by Eleonore Chiquito MD Signature Date/Time:  01/03/2022/4:18:04 PM    Final  US Abdomen Limited RUQ (LIVER/GB)  Result Date: 01/01/2022 CLINICAL DATA:  Transaminitis EXAM: ULTRASOUND ABDOMEN LIMITED RIGHT UPPER QUADRANT COMPARISON:  CT today FINDINGS: Gallbladder: No gallstones or wall thickening visualized. No sonographic Murphy sign noted by sonographer. Common bile duct: Diameter: Normal caliber, 4 mm Liver: No focal lesion identified. Within normal limits in parenchymal echogenicity. Portal vein is patent on color Doppler imaging with normal direction of blood flow towards the liver. Other: None. IMPRESSION: Unremarkable right upper quadrant ultrasound. Electronically Signed   By: Rolm Baptise M.D.   On: 01/01/2022 19:09   CT ABDOMEN PELVIS WO CONTRAST  Result Date: 01/01/2022 CLINICAL DATA:  66 year old female with acute abdominal pain. Decreased p.o. intake and urine output. EXAM: CT ABDOMEN AND PELVIS WITHOUT CONTRAST TECHNIQUE: Multidetector CT imaging of the abdomen and pelvis was performed following the standard protocol without IV contrast. RADIATION DOSE REDUCTION: This exam was performed according to the departmental dose-optimization program which includes automated exposure control, adjustment of the mA and/or kV according to patient size and/or use of iterative reconstruction technique. COMPARISON:  Pelvis ultrasound 12/19/2021. FINDINGS: Lower chest: Borderline to mild cardiomegaly and minor atelectasis at the lung bases. No pericardial or pleural effusion. Hepatobiliary: Negative noncontrast liver and gallbladder. Pancreas: Fatty atrophy. Spleen: Fairly circumscribed 2.5 cm low-density area in the medial spleen appears to be solitary on this noncontrast exam (series 2, image 21) and is most likely benign such as hemangioma (no follow-up imaging recommended). Adrenals/Urinary Tract: Normal adrenal glands. Nonobstructed kidneys. No nephrolithiasis. Numerous small bilateral renal cysts (no follow-up imaging recommended). Both ureters are  decompressed to the bladder. Bladder distension estimated at 520 mL. No bladder wall thickening or perivesical inflammation identified. Stomach/Bowel: Large bowel retained stool. Occasional diverticula in the sigmoid colon. Oral contrast has reached the hepatic flexure. No large bowel inflammation. Normal appendix on series 2, image 68. Negative terminal ileum. No dilated small bowel. Decompressed stomach. Negative duodenum. No free air, free fluid, or convincing mesenteric inflammation. Vascular/Lymphatic: Widespread calcified atherosclerosis. Calcified aortic atherosclerosis. Normal caliber abdominal aorta. No lymphadenopathy identified. Reproductive: Abnormal low-density endometrial thickening for age (series 2, image 19 and sagittal image 54). Small calcified fundal fibroid. Ovaries are within normal limits. Other: No pelvic free fluid. Musculoskeletal: Chronic appearing right 7th rib fracture. No acute osseous abnormality identified. Bilateral flank body wall edema is partially visible and greater on the right (series 2, image 73). IMPRESSION: 1. Abnormal low-density endometrial thickening for a postmenopausal patient. Endometrial sampling is indicated to exclude carcinoma. If results are benign, sonohysterogram should be considered for focal lesion work-up. (Ref: Radiological Reasoning: Algorithmic Workup of Abnormal Vaginal Bleeding with Endovaginal Sonography and Sonohysterography. AJR 2008; 811:B14-78). 2. Bladder distension estimated at 520 mL. Query urinary retention. 3. No other acute or inflammatory process identified in the noncontrast abdomen or pelvis. Aortic Atherosclerosis (ICD10-I70.0). Electronically Signed   By: Genevie Ann M.D.   On: 01/01/2022 04:19   DG Chest Port 1 View  Result Date: 01/01/2022 CLINICAL DATA:  Hyponatremia EXAM: PORTABLE CHEST 1 VIEW COMPARISON:  12/18/2021 FINDINGS: Lungs are clear.  No pleural effusion or pneumothorax. The heart is normal in size. IMPRESSION: No evidence of  acute cardiopulmonary disease. Electronically Signed   By: Julian Hy M.D.   On: 01/01/2022 00:03   US PELVIS (TRANSABDOMINAL ONLY)  Result Date: 12/19/2021 CLINICAL DATA:  Abnormal vaginal bleeding. EXAM: TRANSABDOMINAL ULTRASOUND OF PELVIS TECHNIQUE: Transabdominal ultrasound examination of the pelvis was performed including evaluation of the uterus, ovaries, adnexal regions, and pelvic cul-de-sac.  Patient declined transvaginal examination. COMPARISON:  None Available. FINDINGS: Uterus Measurements: 10.1 x 5.7 x 4.8 cm = volume: 145 mL. Shadowing echogenic structure measuring 1.5 x 1.2 x 0.9 cm in the uterine fundus. Endometrium Thickness: 6.4 mm. Heterogeneous appearance of the endometrium along the lower uterine segment. Right ovary Not visualized. Left ovary Not visualized. Other findings:  No abnormal free fluid. IMPRESSION: Evaluation is somewhat limited by patient's refusal of the transvaginal portion of the examination. Within this context: 1. Heterogeneous appearance of the endometrium along the lower uterine segment with the endometrial stripe measuring 6.4 mm, consider further evaluation with endometrial sampling versus sonohysterogram in the setting of abnormal postmenopausal uterine bleeding. 2. Shadowing echogenic structure measuring 1.5 cm in the uterine fundus, likely a calcified fibroid. 3. Bilateral ovaries are not visualized and may be atrophic or obscured by bowel gas. Electronically Signed   By: Dahlia Bailiff M.D.   On: 12/19/2021 13:17   US Venous Img Lower Unilateral Left  Result Date: 12/18/2021 CLINICAL DATA:  Unilateral leg swelling EXAM: LEFT LOWER EXTREMITY VENOUS DOPPLER ULTRASOUND TECHNIQUE: Gray-scale sonography with graded compression, as well as color Doppler and duplex ultrasound were performed to evaluate the lower extremity deep venous systems from the level of the common femoral vein and including the common femoral, femoral, profunda femoral, popliteal and calf  veins including the posterior tibial, peroneal and gastrocnemius veins when visible. The superficial great saphenous vein was also interrogated. Spectral Doppler was utilized to evaluate flow at rest and with distal augmentation maneuvers in the common femoral, femoral and popliteal veins. COMPARISON:  None Available. FINDINGS: Contralateral Common Femoral Vein: Respiratory phasicity is normal and symmetric with the symptomatic side. No evidence of thrombus. Normal compressibility. Common Femoral Vein: No evidence of thrombus. Normal compressibility, respiratory phasicity and response to augmentation. Saphenofemoral Junction: No evidence of thrombus. Normal compressibility and flow on color Doppler imaging. Profunda Femoral Vein: No evidence of thrombus. Normal compressibility and flow on color Doppler imaging. Femoral Vein: No evidence of thrombus. Normal compressibility, respiratory phasicity and response to augmentation. Popliteal Vein: No evidence of thrombus. Normal compressibility, respiratory phasicity and response to augmentation. Calf Veins: No evidence of thrombus. Normal compressibility and flow on color Doppler imaging. Superficial Great Saphenous Vein: No evidence of thrombus. Normal compressibility. Venous Reflux:  None. Other Findings:  None. IMPRESSION: No evidence of left lower extremity deep venous thrombosis. Electronically Signed   By: Davina Poke D.O.   On: 12/18/2021 14:38   DG Chest 2 View  Result Date: 12/18/2021 CLINICAL DATA:  Fall, weakness EXAM: CHEST - 2 VIEW COMPARISON:  None Available. FINDINGS: Patient rotation. No consolidation. No visible pleural effusions or pneumothorax. Cardiomediastinal silhouette is within normal limits when accounting for patient rotation. Mildly displaced right lateral seventh rib fracture IMPRESSION: 1. Mildly displaced right lateral seventh rib fracture, potentially acute. 2. No other acute abnormality identified. Electronically Signed   By:  Margaretha Sheffield M.D.   On: 12/18/2021 13:23   DG Pelvis 1-2 Views  Result Date: 12/18/2021 CLINICAL DATA:  Fall 2 days ago EXAM: PELVIS - 1-2 VIEW COMPARISON:  None Available. FINDINGS: Patient is rotated. There is no evidence of pelvic fracture or diastasis. No pelvic bone lesions are seen. Prominent atherosclerotic vascular calcifications. IMPRESSION: Negative. Electronically Signed   By: Davina Poke D.O.   On: 12/18/2021 13:22   CT Cervical Spine Wo Contrast  Result Date: 12/18/2021 CLINICAL DATA:  Neck trauma EXAM: CT CERVICAL SPINE WITHOUT CONTRAST TECHNIQUE: Multidetector CT imaging of  the cervical spine was performed without intravenous contrast. Multiplanar CT image reconstructions were also generated. RADIATION DOSE REDUCTION: This exam was performed according to the departmental dose-optimization program which includes automated exposure control, adjustment of the mA and/or kV according to patient size and/or use of iterative reconstruction technique. COMPARISON:  None Available. FINDINGS: Alignment: Grade 1 anterolisthesis of C6 on C7. No acute subluxation. Skull base and vertebrae: No acute fracture. No primary bone lesion or focal pathologic process. Soft tissues and spinal canal: No prevertebral fluid or swelling. No visible canal hematoma. Disc levels: Mild to moderate intervertebral disc height loss at C4-C5 and C5-C6 with accompanying endplate osteophytes and uncovertebral spurring. Bilateral mild facet arthropathy. Upper chest: No acute process identified. Other: Coarse calcified plaques noted in the right proximal ICA. IMPRESSION: No acute fracture or malalignment identified. Chronic findings as described. Electronically Signed   By: Ofilia Neas M.D.   On: 12/18/2021 13:07   CT Head Wo Contrast  Result Date: 12/18/2021 CLINICAL DATA:  Head trauma EXAM: CT HEAD WITHOUT CONTRAST TECHNIQUE: Contiguous axial images were obtained from the base of the skull through the vertex  without intravenous contrast. RADIATION DOSE REDUCTION: This exam was performed according to the departmental dose-optimization program which includes automated exposure control, adjustment of the mA and/or kV according to patient size and/or use of iterative reconstruction technique. COMPARISON:  None Available. FINDINGS: Brain: No acute intracranial hemorrhage, mass effect, or herniation. No extra-axial fluid collections. No evidence of acute territorial infarct. No hydrocephalus. Mild cortical volume loss. Mild patchy hypodensities in the periventricular and subcortical white matter, likely secondary to chronic microvascular ischemic changes. Vascular: Calcified plaques in the carotid siphons. Skull: Normal. Negative for fracture or focal lesion. Sinuses/Orbits: No acute finding. Other: None. IMPRESSION: Chronic changes with no acute intracranial process identified. Electronically Signed   By: Ofilia Neas M.D.   On: 12/18/2021 13:04      Assessment/Plan 1.  End-stage renal disease  Patient has begun to display uremic symptoms.  She does have acute kidney injury with hyponatremia and had baseline chronic kidney disease stage V.  Nephrology recommended PermCath placement to begin dialysis.  Discussed PermCath placement with patient and she is agreeable to proceed.  We will plan on PermCath placement today.  We will follow-up with patient on an outpatient basis for vein mapping to discuss permanent access.  Family Communication: No family at bedside   Kris Hartmann, NP [facility] [phone] (Office Phone) [phone] (Office Fax)  01/09/2022 10:26 AM    This note was created with Dragon medical transcription system.  Any error is purely unintentional

## 2022-01-10 ENCOUNTER — Inpatient Hospital Stay: Payer: Medicare Other

## 2022-01-10 DIAGNOSIS — A419 Sepsis, unspecified organism: Secondary | ICD-10-CM | POA: Diagnosis not present

## 2022-01-10 DIAGNOSIS — R6520 Severe sepsis without septic shock: Secondary | ICD-10-CM | POA: Diagnosis not present

## 2022-01-10 DIAGNOSIS — R509 Fever, unspecified: Secondary | ICD-10-CM

## 2022-01-10 LAB — GLUCOSE, CAPILLARY
Glucose-Capillary: 105 mg/dL — ABNORMAL HIGH (ref 70–99)
Glucose-Capillary: 165 mg/dL — ABNORMAL HIGH (ref 70–99)
Glucose-Capillary: 117 mg/dL — ABNORMAL HIGH (ref 70–99)

## 2022-01-10 MED ORDER — ACETAMINOPHEN 325 MG PO TABS
ORAL_TABLET | ORAL | Status: AC
  Filled 2022-01-10: qty 2

## 2022-01-10 MED ORDER — OXYCODONE HCL 5 MG PO TABS
ORAL_TABLET | ORAL | Status: AC
  Filled 2022-01-10: qty 1

## 2022-01-10 MED ORDER — FUROSEMIDE 40 MG PO TABS
40.0000 mg | ORAL_TABLET | Freq: Every day | ORAL | Status: DC
  Administered 2022-01-11 – 2022-01-24 (×11): 40 mg via ORAL
  Filled 2022-01-10 (×13): qty 1

## 2022-01-10 MED ORDER — HEPARIN SODIUM (PORCINE) 1000 UNIT/ML IJ SOLN
INTRAMUSCULAR | Status: AC
  Administered 2022-01-10: 4200 [IU] via INTRAVENOUS_CENTRAL
  Filled 2022-01-10: qty 10

## 2022-01-10 NOTE — Progress Notes (Signed)
Hemodialysis Post Treatment Note:  Tx date:01/10/2022 Tx time:2 hours and 30 minutes Access: right CVC UF Removed: No fluid removal as ordered  Note:  HD treatment completed. Tolerated well. No HD complications

## 2022-01-10 NOTE — Progress Notes (Signed)
[facility]  PROGRESS NOTE   Subjective:   Patient seen on dialysis.  Still febrile.  Had blood cultures done.  On Keflex.  Objective:  Vital signs: Blood pressure (!) 132/56, pulse 84, temperature 100.2 F (37.9 C), temperature source Oral, resp. rate (!) 23, height 5\' 3"  (1.6 m), weight 101.2 kg, SpO2 92 %.  Intake/Output Summary (Last 24 hours) at 01/10/2022 1356 Last data filed at 01/10/2022 0639 Gross per 24 hour  Intake 8 ml  Output 200 ml  Net -192 ml   Filed Weights   01/09/22 0500 01/09/22 1632 01/10/22 0500  Weight: 106.5 kg 101 kg 101.2 kg     Physical Exam: General:  No acute distress  Head:  Normocephalic, atraumatic. Moist oral mucosal membranes  Eyes:  Anicteric  Neck:  Supple  Lungs:   Clear to auscultation, normal effort  Heart:  S1S2 no rubs  Abdomen:   Soft, nontender, bowel sounds present  Extremities: 2+ peripheral edema.  Neurologic:  Awake, alert, following commands  Skin:  No lesions  Access:     Basic Metabolic Panel: Recent Labs  Lab 01/04/22 0618 01/05/22 0512 01/06/22 0532 01/07/22 0629 01/08/22 0941 01/09/22 0959  NA 133* 134* 134* 132* 134* 137  K 4.4 4.8 4.7 4.9 4.9 4.7  CL 105 102 99 96* 95* 100  CO2 18* 20* 23 24 27 26   GLUCOSE 244* 193* 214* 215* 145* 87  BUN 99* 98* 121* 120* 128* 123*  CREATININE 3.98* 3.93* 4.18* 4.17* 4.36* 4.73*  CALCIUM 9.1 10.0 10.3 10.2 10.1 9.2  MG 2.1 2.0  --  1.8  --   --   PHOS  --   --   --   --  4.1 4.0    CBC: Recent Labs  Lab 01/05/22 0512 01/06/22 0532 01/07/22 0629 01/08/22 0941 01/09/22 0959  WBC 10.5 12.2* 10.4 11.6* 11.1*  HGB 8.4* 8.3* 9.1* 8.4* 8.1*  HCT 26.4* 25.7* 28.5* 26.1* 25.7*  MCV 92.3 92.8 93.4 93.9 93.1  PLT 393 395 435* 445* 396     Urinalysis: No results for input(s): "COLORURINE", "LABSPEC", "PHURINE", "GLUCOSEU", "HGBUR", "BILIRUBINUR", "KETONESUR", "PROTEINUR", "UROBILINOGEN", "NITRITE", "LEUKOCYTESUR" in the last 72 hours.  Invalid  input(s): "APPERANCEUR"    Imaging: DG Chest Port 1 View  Result Date: 01/10/2022 CLINICAL DATA:  New onset fever and shortness of breath. EXAM: PORTABLE CHEST 1 VIEW COMPARISON:  12/31/2021 FINDINGS: 0833 hours. Low volume film. Cardiopericardial silhouette is at upper limits of normal for size. There is pulmonary vascular congestion without overt pulmonary edema. Minimal atelectasis or scarring noted left base. No overt edema or focal airspace consolidation. Right IJ dialysis catheter tip overlies the right atrium. Telemetry leads overlie the chest. IMPRESSION: Pulmonary vascular congestion without overt pulmonary edema. Electronically Signed   By: Misty Stanley M.D.   On: 01/10/2022 10:31   PERIPHERAL VASCULAR CATHETERIZATION  Result Date: 01/09/2022 See surgical note for result.    Medications:     (feeding supplement) PROSource Plus  30 mL Oral BID BM   vitamin C  500 mg Oral BID   atorvastatin  40 mg Oral Daily   cephALEXin  250 mg Oral Q8H   Chlorhexidine Gluconate Cloth  6 each Topical Q0600   fluticasone  2 spray Each Nare Daily   [START ON 01/11/2022] furosemide  40 mg Oral Daily   gabapentin  100 mg Oral TID   insulin aspart  0-5 Units Subcutaneous QHS   insulin aspart  0-9 Units Subcutaneous  TID WC   insulin aspart  3 Units Subcutaneous TID WC   insulin glargine-yfgn  20 Units Subcutaneous BID   loratadine  10 mg Oral Daily   metoprolol succinate  50 mg Oral BID   multivitamin  1 tablet Oral QHS   nutrition supplement (JUVEN)  1 packet Oral BID BM   zinc sulfate  220 mg Oral Daily    Assessment/ Plan:     Principal Problem:   Severe sepsis (HCC) Active Problems:   OSA (obstructive sleep apnea)   Chronic kidney disease, stage 5 (HCC)   Essential hypertension   DNR (do not resuscitate)/DNI(Do Not Intubate)   Type 2 diabetes mellitus with chronic kidney disease, with long-term current use of insulin (HCC)   Hypomagnesemia   Pressure injury of skin   Symptomatic  anemia   Elevated LFTs   Sacral wound, initial encounter   Swelling of left lower extremity   Abdominal pain   AKI (acute kidney injury) ([place])   Hyponatremia   Premature atrial complexes   Anasarca associated with disorder of kidney   Fever  66 year old female with history of hypertension, diabetes, peripheral vascular disease, hyperlipidemia, now with sepsis and acute kidney injury on the top of chronic kidney disease.  Patient is reached end-stage renal disease and was started on renal replacement therapy.  #1: End-stage renal disease: Patient has a permacath placed.  Presently on dialysis and tolerating treatment well.  #2: Sepsis/fever: Patient has sacral decub ulcers.  We will continue antibiotics as per the primary team.  #3: Anemia: Anemia secondary to chronic kidney disease complicated by sepsis.  S/p 1 unit of PRBC.  We will continue anemia protocol.  Will monitor closely.  #4: Secondary hyperparathyroidism: Calcium and phosphorus levels are within range.  We will continue to monitor closely.  #5: Metabolic acidosis: CO2 improved.  Will discontinue sodium bicarbonate  #6: Congestive heart failure: We will decrease the dose of furosemide to 40 mg daily.  We will continue to monitor closely.   LOS: [place], [facility] 6/10/20231:56 PM

## 2022-01-10 NOTE — Progress Notes (Signed)
Pt returned from HD, she appears drowsy and confused, CNA Anderson Malta attempting to clean pt and pt not letting her. I went into the room to help the CNA, I educated pt that we cannot leave her dirty and that we had to clean her, Pt stated that she will go home and do it herself. With reorientation we were able to get her clean. When repositioning her in bed, pt became irritable and verbally abusive, pt tried to punch CNA. Pt was redirected that she was in the hospital and that she is not to punch people. Pt became more calm and cooperative after.

## 2022-01-10 NOTE — Progress Notes (Signed)
OT Cancellation Note  Patient Details Name: Theresa Warren MRN: [id number] DOB: 1955-09-16   Cancelled Treatment:    Reason Eval/Treat Not Completed: Patient at procedure or test/ unavailable. Pt out of the room upon attempt. Per chart, pt in dialysis. Will re-attempt OT tx at later date/time as pt is available.   Ardeth Perfect., MPH, MS, OTR/L ascom [phone] 01/10/22, 1:51 PM

## 2022-01-10 NOTE — Progress Notes (Signed)
Patient down to dialysis via transport.

## 2022-01-10 NOTE — Progress Notes (Addendum)
PROGRESS NOTE    Theresa Warren  RCB:[id number] DOB: August 23, 1955 DOA: 12/31/2021 PCP: Crist Infante, MD    Brief Narrative:  Theresa Warren is a 66 year old female hyperlipidemia, hypertension, CKD stage V, insulin-dependent diabetes mellitus, who presents emergency department from [facility] for evaluation of low sodium noted on labs at the facility.  She was discharged to rehab on 5/23 after being admitted following a fall and also with acute on chronic anemia.   ED Course - temp 99.6, HR 107, otherwise unremarkable vitals. Labs with sodium 129, potassium 5.1, chloride 99, bicarb 17, BUN of 100, serum creatinine of 4.61, GFR of 10, WBC 18.4, hemoglobin 6.9, platelets of 473.  LFT's were also elevated.   Patient met criteria for sepsis on arrival, treated per protocol in the ED with IV Cefepime, Flagyl, Vanc, and LR 1 L bolus fluids.  Infection source unclear but felt possibly due to sacral wound infection and concern for underlying osteomyelitis.   Admitted to the hospitalist service for further evaluation and management.  Continued on IV Cefepime and Vanc.   General surgery was consulted for debridement on sacral pressure injury.  Nephrology consulted due to worsening renal function  6/7 complaining of pain where the wound VAC is after being changed today no other complaints. 6/8 discussed with pt about SNF as she lives alone and is weak.she will reconsider it. Messaged case mx to discuss options. 6/9 confused and mildly lethargic this am.plan for HD today as displaying uremic sx. 6/10 she feels better today.  Less lethargic and confused for me today.  Status post tunneled dialysis catheter right IJ by vascular surgery on 6/9 first HD was started yesterday.Febrile yesterday afternoon  Consultants:  Surgery, vascular surgery, nephrology  Procedures:   Antimicrobials:  Keflex   Subjective: MS better today.  No asterixis.  Denies any shortness of breath or chest  pain.  Reports feeling better today.  Objective: Vitals:   01/09/22 2304 01/10/22 0033 01/10/22 0500 01/10/22 0509  BP:  (!) 128/47  133/63  Pulse:  73  65  Resp: 15 18  18   Temp: 99.8 F (37.7 C) 99 F (37.2 C)  (!) 97.5 F (36.4 C)  TempSrc: Oral Oral  Oral  SpO2:  97%  100%  Weight:   101.2 kg   Height:        Intake/Output Summary (Last 24 hours) at 01/10/2022 0809 Last data filed at 01/10/2022 8032 Gross per 24 hour  Intake 8 ml  Output 200 ml  Net -192 ml   Filed Weights   01/09/22 0500 01/09/22 1632 01/10/22 0500  Weight: 106.5 kg 101 kg 101.2 kg    Examination: Calm, NAD Cta no w/r Reg s1/s2 no gallop Soft benign +bs +edema b/l. Vac in place wound directly reviewed Aaoxox3  Mood and affect appropriate in current setting     Data Reviewed: I have personally reviewed following labs and imaging studies  CBC: Recent Labs  Lab 01/05/22 0512 01/06/22 0532 01/07/22 0629 01/08/22 0941 01/09/22 0959  WBC 10.5 12.2* 10.4 11.6* 11.1*  HGB 8.4* 8.3* 9.1* 8.4* 8.1*  HCT 26.4* 25.7* 28.5* 26.1* 25.7*  MCV 92.3 92.8 93.4 93.9 93.1  PLT 393 395 435* 445* 122   Basic Metabolic Panel: Recent Labs  Lab 01/04/22 0618 01/05/22 0512 01/06/22 0532 01/07/22 0629 01/08/22 0941 01/09/22 0959  NA 133* 134* 134* 132* 134* 137  K 4.4 4.8 4.7 4.9 4.9 4.7  CL 105 102 99 96* 95* 100  CO2  18* 20* 23 24 27 26   GLUCOSE 244* 193* 214* 215* 145* 87  BUN 99* 98* 121* 120* 128* 123*  CREATININE 3.98* 3.93* 4.18* 4.17* 4.36* 4.73*  CALCIUM 9.1 10.0 10.3 10.2 10.1 9.2  MG 2.1 2.0  --  1.8  --   --   PHOS  --   --   --   --  4.1 4.0   GFR: Estimated Creatinine Clearance: 13.6 mL/min (A) (by C-G formula based on SCr of 4.73 mg/dL (H)). Liver Function Tests: Recent Labs  Lab 01/05/22 0512 01/06/22 0532 01/08/22 0941 01/09/22 0959  AST 125* 128*  --   --   ALT 76* 75*  --   --   ALKPHOS 286* 257*  --   --   BILITOT 0.5 0.6  --   --   PROT 6.8 7.4  --   --   ALBUMIN  3.0* 3.3* 2.9* 2.5*   No results for input(s): "LIPASE", "AMYLASE" in the last 168 hours. No results for input(s): "AMMONIA" in the last 168 hours. Coagulation Profile: No results for input(s): "INR", "PROTIME" in the last 168 hours.  Cardiac Enzymes: No results for input(s): "CKTOTAL", "CKMB", "CKMBINDEX", "TROPONINI" in the last 168 hours. BNP (last 3 results) No results for input(s): "PROBNP" in the last 8760 hours. HbA1C: No results for input(s): "HGBA1C" in the last 72 hours. CBG: Recent Labs  Lab 01/09/22 1229 01/09/22 1348 01/09/22 1539 01/09/22 2146 01/10/22 0725  GLUCAP 90 90 95 102* 105*   Lipid Profile: No results for input(s): "CHOL", "HDL", "LDLCALC", "TRIG", "CHOLHDL", "LDLDIRECT" in the last 72 hours. Thyroid Function Tests: No results for input(s): "TSH", "T4TOTAL", "FREET4", "T3FREE", "THYROIDAB" in the last 72 hours. Anemia Panel: No results for input(s): "VITAMINB12", "FOLATE", "FERRITIN", "TIBC", "IRON", "RETICCTPCT" in the last 72 hours. Sepsis Labs: No results for input(s): "PROCALCITON", "LATICACIDVEN" in the last 168 hours.   Recent Results (from the past 240 hour(s))  Blood culture (routine x 2)     Status: None   Collection Time: 12/31/21 11:35 PM   Specimen: BLOOD  Result Value Ref Range Status   Specimen Description BLOOD RIGHT ARM  Final   Special Requests   Final    BOTTLES DRAWN AEROBIC AND ANAEROBIC Blood Culture adequate volume   Culture   Final    NO GROWTH 5 DAYS Performed at [facility], [address] [place], [place] [address]    Report Status 01/06/2022 FINAL  Final  Blood culture (routine x 2)     Status: None   Collection Time: 12/31/21 11:49 PM   Specimen: BLOOD  Result Value Ref Range Status   Specimen Description BLOOD RIGHT HAND  Final   Special Requests   Final    BOTTLES DRAWN AEROBIC AND ANAEROBIC Blood Culture adequate volume   Culture   Final    NO GROWTH 5 DAYS Performed at [facility],  [address], [place], [place] [address]    Report Status 01/06/2022 FINAL  Final  MRSA Next Gen by PCR, Nasal     Status: None   Collection Time: 01/01/22 10:17 AM   Specimen: Nasal Mucosa; Nasal Swab  Result Value Ref Range Status   MRSA by PCR Next Gen NOT DETECTED NOT DETECTED Final    Comment: (NOTE) The GeneXpert MRSA Assay (FDA approved for NASAL specimens only), is one component of a comprehensive MRSA colonization surveillance program. It is not intended to diagnose MRSA infection nor to guide or monitor treatment  for MRSA infections. Test performance is not FDA approved in patients less than 51 years old. Performed at [facility], [place], [place] [address]   Aerobic/Anaerobic Culture w Gram Stain (surgical/deep wound)     Status: None   Collection Time: 01/01/22  3:37 PM   Specimen: PATH Soft tissue  Result Value Ref Range Status   Specimen Description TISSUE  Final   Special Requests BUTTOCKS WOUND  Final   Gram Stain   Final    NO SQUAMOUS EPITHELIAL CELLS SEEN FEW WBC SEEN RARE GRAM POSITIVE RODS FEW GRAM POSITIVE COCCI    Culture   Final    FEW STAPHYLOCOCCUS AUREUS RARE ENTEROCOCCUS FAECALIS NO ANAEROBES ISOLATED Performed at [facility], [address], [place], [place] [address]    Report Status 01/06/2022 FINAL  Final   Organism ID, Bacteria STAPHYLOCOCCUS AUREUS  Final   Organism ID, Bacteria ENTEROCOCCUS FAECALIS  Final      Susceptibility   Enterococcus faecalis - MIC*    AMPICILLIN <=2 SENSITIVE Sensitive     VANCOMYCIN 1 SENSITIVE Sensitive     GENTAMICIN SYNERGY SENSITIVE Sensitive     * RARE ENTEROCOCCUS FAECALIS   Staphylococcus aureus - MIC*    CIPROFLOXACIN >=8 RESISTANT Resistant     ERYTHROMYCIN >=8 RESISTANT Resistant     GENTAMICIN <=0.5 SENSITIVE Sensitive     OXACILLIN <=0.25 SENSITIVE Sensitive     TETRACYCLINE <=1 SENSITIVE Sensitive     VANCOMYCIN 1 SENSITIVE Sensitive     TRIMETH/SULFA <=10  SENSITIVE Sensitive     CLINDAMYCIN >=8 RESISTANT Resistant     RIFAMPIN <=0.5 SENSITIVE Sensitive     Inducible Clindamycin NEGATIVE Sensitive     * FEW STAPHYLOCOCCUS AUREUS  Aerobic/Anaerobic Culture w Gram Stain (surgical/deep wound)     Status: None   Collection Time: 01/01/22  3:46 PM   Specimen: PATH Soft tissue  Result Value Ref Range Status   Specimen Description TISSUE  Final   Special Requests BUTTOCKS WOUND NO 2  Final   Gram Stain   Final    NO SQUAMOUS EPITHELIAL CELLS SEEN FEW WBC SEEN FEW GRAM POSITIVE COCCI    Culture   Final    MODERATE STAPHYLOCOCCUS AUREUS FEW STAPHYLOCOCCUS EPIDERMIDIS NO ANAEROBES ISOLATED Performed at [facility], [address] [place],  [address]    Report Status 01/06/2022 FINAL  Final   Organism ID, Bacteria STAPHYLOCOCCUS AUREUS  Final   Organism ID, Bacteria STAPHYLOCOCCUS EPIDERMIDIS  Final      Susceptibility   Staphylococcus aureus - MIC*    CIPROFLOXACIN >=8 RESISTANT Resistant     ERYTHROMYCIN >=8 RESISTANT Resistant     GENTAMICIN <=0.5 SENSITIVE Sensitive     OXACILLIN <=0.25 SENSITIVE Sensitive     TETRACYCLINE <=1 SENSITIVE Sensitive     VANCOMYCIN 1 SENSITIVE Sensitive     TRIMETH/SULFA <=10 SENSITIVE Sensitive     CLINDAMYCIN >=8 RESISTANT Resistant     RIFAMPIN <=0.5 SENSITIVE Sensitive     Inducible Clindamycin NEGATIVE Sensitive     * MODERATE STAPHYLOCOCCUS AUREUS   Staphylococcus epidermidis - MIC*    CIPROFLOXACIN 4 RESISTANT Resistant     ERYTHROMYCIN >=8 RESISTANT Resistant     GENTAMICIN <=0.5 SENSITIVE Sensitive     OXACILLIN >=4 RESISTANT Resistant     TETRACYCLINE 2 SENSITIVE Sensitive     VANCOMYCIN 1 SENSITIVE Sensitive     TRIMETH/SULFA <=10 SENSITIVE Sensitive     CLINDAMYCIN >=8 RESISTANT Resistant  RIFAMPIN <=0.5 SENSITIVE Sensitive     Inducible Clindamycin NEGATIVE Sensitive     * FEW STAPHYLOCOCCUS EPIDERMIDIS         Radiology Studies: PERIPHERAL VASCULAR  CATHETERIZATION  Result Date: 01/09/2022 See surgical note for result.       Scheduled Meds:  (feeding supplement) PROSource Plus  30 mL Oral BID BM   vitamin C  500 mg Oral BID   atorvastatin  40 mg Oral Daily   cephALEXin  250 mg Oral Q8H   Chlorhexidine Gluconate Cloth  6 each Topical Q0600   fluticasone  2 spray Each Nare Daily   furosemide  40 mg Oral BID   gabapentin  100 mg Oral TID   insulin aspart  0-5 Units Subcutaneous QHS   insulin aspart  0-9 Units Subcutaneous TID WC   insulin aspart  3 Units Subcutaneous TID WC   insulin glargine-yfgn  20 Units Subcutaneous BID   loratadine  10 mg Oral Daily   metoprolol succinate  50 mg Oral BID   multivitamin  1 tablet Oral QHS   nutrition supplement (JUVEN)  1 packet Oral BID BM   sodium bicarbonate  1,300 mg Oral BID   zinc sulfate  220 mg Oral Daily   Continuous Infusions:    Assessment & Plan:   Principal Problem:   Severe sepsis ([place]) Active Problems:   Chronic kidney disease, stage 5 ([place])   Essential hypertension   DNR (do not resuscitate)/DNI(Do Not Intubate)   Type 2 diabetes mellitus with chronic kidney disease, with long-term current use of insulin ([place])   Sacral wound, initial encounter   Swelling of left lower extremity   Abdominal pain   AKI (acute kidney injury) ([employer])   Hypomagnesemia   Symptomatic anemia   Hyponatremia   Premature atrial complexes   OSA (obstructive sleep apnea)   Pressure injury of skin   Elevated LFTs   Anasarca associated with disorder of kidney    * Severe sepsis (HCC) POA as evidenced by tachycardia, leukocytosis, AKI and transaminitis consistent with organ dysfunction and severe sepsis with suspected infection source being sacral wounds versus an abdominal source (CT abdomen pelvis negative for anything acute however).  MRSA PCR negative. Wound cultures grew few Staph aureus, rare Enterococcus faecalis. Initially treated with vancomycin/cefepime. Vanc stopped 6/4. Plan  is to treat MSSA in both cultures.  Enterococcus likely contaminated from stool d/T location of wounds and MRSE likely colonization of skin  6/10 continue with Keflex    Fever On 6/9 evening Ck bcx Continue keflex for now.  Cxr with edema.  Ck cbc and procalcitonin   Sacral Decubitus wound Continue abx as above Gsx following, felt wounds stable and signed off.  S/p debridement of three decubitus wounds of b/l buttocks and wound vac placed No further debridment at this time. Healing challenging given her immobility , poor functional status, and poor nutritional status. Local wound care; pressure offload, freq. Repositioning, low air loss mattress.  Pain control prn 6/10 continue wound VAC: Change MWF; [place] RN on board to assist with changes  Type 2 diabetes mellitus with chronic kidney disease, with long-term current use of insulin (HCC) BG better now.  We will continue on current regimen R-ISS    Essential hypertension Losartan held due to AKI. On p.o. Lasix started by nephrology  IV hydralazine as needed  6/10 stable Continue to monitor     AKI on Chronic kidney disease, stage 5 (HCC) Hyponatremia Treated IV Lasix and albumin  per nephrology.  6/7 bilateral lower extremity improving AKI secondary to underlying illness  CKD secondary to diabetes and hypertension  No exposure to IV contrast  CT abdomen pelvis negative for obstruction, several renal stones identified.   Losartan held due to AKI  Now switched to Lasix p.o. 40 mg twice daily  6/8 albumin was  stopped 6/9 exhibiting uremic symptoms today.  6/10 status post permacath placement on 6/9 by Dr. Delana Meyer first HD on 6/9 . Uremic sx appears to be improving. MS improved HD again today        Abdominal pain Patient reported on admission.  CT of abdomen pelvis showed abnormal endometrial thickening for a postmenopausal patient, bladder distention estimated 520 mL, no other acute or inflammatory processes or  acute findings. --Monitor clinically for now -- Needs OB/GYN follow-up for endometrial sampling to exclude endometrial carcinoma 6/10 no further complaints    Swelling of left lower extremity With redness, patient attributes it to clindamycin.  She reported on admission that this has been present for over one month.   Left lower extremity Doppler US on 12/18/21 was negative for DVT.       Symptomatic anemia Hemoglobin on admission was 6.9 >> 6.3, down from 7.6 at time of discharge on 5/23.  No signs of active bleeding, did have vaginal/endometrial bleeding previous admission but no reports of this recently.  May have slow chronic blood loss from sacral wounds.  Has baseline anemia of chronic disease due to renal failure. Received 1 unit PRBC transfusion on admission. -- Transfusing another unit RBCs today -- Trend hemoglobin and transfuse if less than 7   Hypomagnesemia Resolved with replacement on 6/3. Monitor and replace Mg as needed.     Premature atrial complexes Patient became significantly tachycardic on 6/2 with heart rates sustaining in the 130s.  Cardiology reviewed telemetry and feels patient dysrhythmias were not A-fib but sinus tach with PACs. --Cardiology following On beta blk     Elevated LFTs On admission, alk phos 212, AST 182, ALT 107.  Unclear etiology, repeat LFTs not available today.  Appears LFTs were elevated at time of discharge on 5/23 but have increased since. RUQ ultrasound unremarkable. Acute hepatitis panel negative.  Pressure injury of skin Present on admission.  Patient has multiple sacral pressure injuries.  Some concern at time of admission that these were the source of sepsis. General surgery consulted and took patient to the OR on 6/1 for debridement.   Now with wound VAC. --Initial wound VAC change Monday 6/5 then every M/W/F -- Wound care consulted -- Frequent repositioning --Follow general surgery recommendations --Pain control --Follow  blood cultures Pressure Injury 01/01/22 Buttocks Left Stage 3 -  Full thickness tissue loss. Subcutaneous fat may be visible but bone, tendon or muscle are NOT exposed. (Active)  01/01/22 0222  Location: Buttocks  Location Orientation: Left  Staging: Stage 3 -  Full thickness tissue loss. Subcutaneous fat may be visible but bone, tendon or muscle are NOT exposed.  Wound Description (Comments):   Present on Admission: Yes     Pressure Injury 01/01/22 Buttocks Right Stage 2 -  Partial thickness loss of dermis presenting as a shallow open injury with a red, pink wound bed without slough. (Active)  01/01/22 0223  Location: Buttocks  Location Orientation: Right  Staging: Stage 2 -  Partial thickness loss of dermis presenting as a shallow open injury with a red, pink wound bed without slough.  Wound Description (Comments):   Present on Admission:  Yes     Pressure Injury 01/01/22 Hip Left Stage 2 -  Partial thickness loss of dermis presenting as a shallow open injury with a red, pink wound bed without slough. (Active)  01/01/22 0225  Location: Hip  Location Orientation: Left  Staging: Stage 2 -  Partial thickness loss of dermis presenting as a shallow open injury with a red, pink wound bed without slough.  Wound Description (Comments):   Present on Admission: Yes     Pressure Injury 01/01/22 Hip Right Stage 3 -  Full thickness tissue loss. Subcutaneous fat may be visible but bone, tendon or muscle are NOT exposed. (Active)  01/01/22 0225  Location: Hip  Location Orientation: Right  Staging: Stage 3 -  Full thickness tissue loss. Subcutaneous fat may be visible but bone, tendon or muscle are NOT exposed.  Wound Description (Comments):   Present on Admission: Yes          OSA (obstructive sleep apnea) .  Appears not to be on CPAP       DVT prophylaxis: SCD Code Status: DNR Family Communication: None at bedside Disposition Plan:  Status is: Inpatient Remains inpatient appropriate  because: renal function worsening, on HD now, needs HD as outpatient, needs SNF      LOS: 9 days   Time spent 35 minutes with more than 50% on [place], MD [facility] Pager [phone]  If 7PM-7AM, please contact night-coverage 01/10/2022, 8:09 AM

## 2022-01-11 ENCOUNTER — Inpatient Hospital Stay: Payer: Medicare Other

## 2022-01-11 DIAGNOSIS — A419 Sepsis, unspecified organism: Secondary | ICD-10-CM | POA: Diagnosis not present

## 2022-01-11 DIAGNOSIS — R6520 Severe sepsis without septic shock: Secondary | ICD-10-CM | POA: Diagnosis not present

## 2022-01-11 LAB — BASIC METABOLIC PANEL
Anion gap: 11 (ref 5–15)
BUN: 60 mg/dL — ABNORMAL HIGH (ref 8–23)
CO2: 28 mmol/L (ref 22–32)
Calcium: 9.3 mg/dL (ref 8.9–10.3)
Chloride: 96 mmol/L — ABNORMAL LOW (ref 98–111)
Creatinine, Ser: 3.01 mg/dL — ABNORMAL HIGH (ref 0.44–1.00)
GFR, Estimated: 17 mL/min — ABNORMAL LOW (ref >60)
Glucose, Bld: 99 mg/dL (ref 70–99)
Potassium: 3.6 mmol/L (ref 3.5–5.1)
Sodium: 135 mmol/L (ref 135–145)

## 2022-01-11 LAB — GLUCOSE, CAPILLARY
Glucose-Capillary: 138 mg/dL — ABNORMAL HIGH (ref 70–99)
Glucose-Capillary: 88 mg/dL (ref 70–99)
Glucose-Capillary: 87 mg/dL (ref 70–99)
Glucose-Capillary: 83 mg/dL (ref 70–99)
Glucose-Capillary: 104 mg/dL — ABNORMAL HIGH (ref 70–99)
Glucose-Capillary: 164 mg/dL — ABNORMAL HIGH (ref 70–99)

## 2022-01-11 LAB — PROCALCITONIN: Procalcitonin: 1.99 ng/mL

## 2022-01-11 LAB — CBC
HCT: 25.2 % — ABNORMAL LOW (ref 36.0–46.0)
Hemoglobin: 7.9 g/dL — ABNORMAL LOW (ref 12.0–15.0)
MCH: 29.4 pg (ref 26.0–34.0)
MCHC: 31.3 g/dL (ref 30.0–36.0)
MCV: 93.7 fL (ref 80.0–100.0)
Platelets: 295 K/uL (ref 150–400)
RBC: 2.69 MIL/uL — ABNORMAL LOW (ref 3.87–5.11)
RDW: 14.8 % (ref 11.5–15.5)
WBC: 11.3 K/uL — ABNORMAL HIGH (ref 4.0–10.5)
nRBC: 0 % (ref 0.0–0.2)

## 2022-01-11 MED ORDER — INSULIN GLARGINE-YFGN 100 UNIT/ML ~~LOC~~ SOLN
15.0000 [IU] | Freq: Two times a day (BID) | SUBCUTANEOUS | Status: DC
Start: 2022-01-11 — End: 2022-01-11
  Filled 2022-01-11: qty 0.15

## 2022-01-11 MED ORDER — AMPICILLIN-SULBACTAM SODIUM 3 (2-1) G IJ SOLR
3.0000 g | Freq: Two times a day (BID) | INTRAMUSCULAR | Status: DC
  Administered 2022-01-11 – 2022-01-12 (×2): 3 g via INTRAVENOUS
  Filled 2022-01-11: qty 8
  Filled 2022-01-11: qty 3
  Filled 2022-01-11: qty 8

## 2022-01-11 MED ORDER — SODIUM CHLORIDE 0.9 % IV SOLN: 1.0000 g | Freq: Three times a day (TID) | INTRAVENOUS | Status: DC

## 2022-01-11 NOTE — Progress Notes (Signed)
Occupational Therapy Treatment Patient Details Name: Theresa Warren MRN: [id number] DOB: 1955-11-09 Today's Date: 01/11/2022   History of present illness [name] is a 66 year old female admitted for evaluation of low sodium noted on labs at the facility.  Pt was discharged  to rehab on 5/23 after being admitted following a fall. PMH significant for acute on chronic anemia. hyperlipidemia, hypertension, CKD stage V, insulin-dependent diabetes mellitus   OT comments  [name] was seen for OT tx this date. Pt received notably lethargic/mildly confused. RN aware and informed this Pryor Curia prior to session that pt had been lethargic this date. Pt agreeable to OT tx session with mild encouragement. She is engaged in ADL/therapeutic exercises as described below (see ADL section for additional detail on functional performance). She requires consistent cueing with redirection to task t/o session. Pt unsafe to progress with further functional mobility this date and endorses increased fatigue after tx session, but voces satisfaction with her ability to engage in bed-level exercise and activity. Pt making good progress toward goals and continues to benefit from skilled OT services to maximize return to PLOF and minimize risk of future falls, injury, caregiver burden, and readmission. Will continue to follow POC. Discharge recommendation remains appropriate.     Recommendations for follow up therapy are one component of a multi-disciplinary discharge planning process, led by the attending physician.  Recommendations may be updated based on patient status, additional functional criteria and insurance authorization.    Follow Up Recommendations  Skilled nursing-short term rehab (<3 hours/day)    Assistance Recommended at Discharge Frequent or constant Supervision/Assistance  Patient can return home with the following  A lot of help with bathing/dressing/bathroom;A little help with walking  and/or transfers   Equipment Recommendations  BSC/3in1    Recommendations for Other Services      Precautions / Restrictions Precautions Precautions: Fall Restrictions Weight Bearing Restrictions: No       Mobility Bed Mobility Overal bed mobility: Needs Assistance             General bed mobility comments: +2 MAX A to boost up in bed.    Transfers                   General transfer comment: Deferred for pt/therapist safety.     Balance                                           ADL either performed or assessed with clinical judgement   ADL Overall ADL's : Needs assistance/impaired Eating/Feeding: Sitting;Set up Eating/Feeding Details (indicate cue type and reason): able to come to long sitting in bed with use of bed rails. takes drinks from straw cup with SET UP assist.                                   General ADL Comments: Pt notably lethargic and midly confused. Requires consistent cueing to follow 1 step commands during session with re-direction to task. Able to perform brief UB grooming and drinking while long-sitting in bed or with bed in chair position with SET UP Assist. BUE/BLE movements are laborious and very limited. Anticipate MAX A with +2 for safety/physical assist for bed/functional mobility or Seated ADL management.    Extremity/Trunk Assessment Upper Extremity Assessment Upper Extremity Assessment: Generalized  weakness   Lower Extremity Assessment Lower Extremity Assessment: Generalized weakness        Vision Patient Visual Report: No change from baseline     Perception     Praxis      Cognition Arousal/Alertness: Lethargic Behavior During Therapy: Flat affect, WFL for tasks assessed/performed Overall Cognitive Status: Impaired/Different from baseline Area of Impairment: Following commands, Safety/judgement                       Following Commands: Follows one step commands with  increased time Safety/Judgement: Decreased awareness of deficits, Decreased awareness of safety   Problem Solving: Requires verbal cues, Difficulty sequencing General Comments: Some confusion; hallucination asking to "use that arm over there", gesturing to bed rail when asked to do UE ther ex.        Exercises Other Exercises Other Exercises: OT facilited BUE/BLE ther ex including ankle pumps, straight leg raises, Horizontal AB/ADduction, forward punches x5-10 reps bilaterally. Increased time/effort to perform with max cueing t/o session. OT also facilitated UB grooming and feeding during session.    Shoulder Instructions       General Comments wound vac in place at start/end of session.    Pertinent Vitals/ Pain       Pain Assessment Pain Assessment: Faces Faces Pain Scale: Hurts little more Pain Location: sacrum wounds with attempts at bed mobility/bed boost. Pain Descriptors / Indicators: Discomfort, Grimacing Pain Intervention(s): Limited activity within patient's tolerance, Monitored during session, Repositioned  Home Living                                          Prior Functioning/Environment              Frequency  Min 2X/week        Progress Toward Goals  OT Goals(current goals can now be found in the care plan section)  Progress towards OT goals: Progressing toward goals  Acute Rehab OT Goals Patient Stated Goal: to return home OT Goal Formulation: With patient Time For Goal Achievement: 01/17/22 Potential to Achieve Goals: [place] Discharge plan remains appropriate;Frequency remains appropriate    Co-evaluation                 AM-PAC OT "6 Clicks" Daily Activity     Outcome Measure   Help from another person eating meals?: None Help from another person taking care of personal grooming?: A Little Help from another person toileting, which includes using toliet, bedpan, or urinal?: A Little Help from another person  bathing (including washing, rinsing, drying)?: A Lot Help from another person to put on and taking off regular upper body clothing?: A Little Help from another person to put on and taking off regular lower body clothing?: A Lot 6 Click Score: 17    End of Session    OT Visit Diagnosis: Other abnormalities of gait and mobility (R26.89);Muscle weakness (generalized) (M62.81)   Activity Tolerance Patient limited by lethargy   Patient Left in bed;with call bell/phone within reach;with bed alarm set   Nurse Communication          Time: 4401-0272 OT Time Calculation (min): 34 min  Charges: OT General Charges $OT Visit: 1 Visit OT Treatments $Self Care/Home Management : 8-22 mins $Therapeutic Exercise: 8-22 mins  Shara Blazing, M.S., OTR/L Ascom: [phone] 01/11/22, 2:31 PM

## 2022-01-11 NOTE — Progress Notes (Signed)
[facility]  PROGRESS NOTE   Subjective:   Seen at bedside.  Comfortable this morning. Had 2 and half hours of dialysis via the permacath yesterday.  Objective:  Vital signs: Blood pressure 134/80, pulse 81, temperature 98.6 F (37 C), resp. rate 18, height 5\' 3"  (1.6 m), weight 98 kg, SpO2 93 %.  Intake/Output Summary (Last 24 hours) at 01/11/2022 1202 Last data filed at 01/10/2022 2226 Gross per 24 hour  Intake 360 ml  Output 500 ml  Net -140 ml   Filed Weights   01/10/22 0500 01/10/22 1612 01/11/22 0500  Weight: 101.2 kg 98 kg 98 kg     Physical Exam: General:  No acute distress  Head:  Normocephalic, atraumatic. Moist oral mucosal membranes  Eyes:  Anicteric  Neck:  Supple  Lungs:   Clear to auscultation, normal effort  Heart:  S1S2 no rubs  Abdomen:   Soft, nontender, bowel sounds present  Extremities:  peripheral edema.  Neurologic:  Awake, alert, following commands  Skin:  No lesions  Access:     Basic Metabolic Panel: Recent Labs  Lab 01/05/22 0512 01/06/22 0532 01/07/22 0629 01/08/22 0941 01/09/22 0959 01/11/22 0625  NA 134* 134* 132* 134* 137 135  K 4.8 4.7 4.9 4.9 4.7 3.6  CL 102 99 96* 95* 100 96*  CO2 20* 23 24 27 26 28   GLUCOSE 193* 214* 215* 145* 87 99  BUN 98* 121* 120* 128* 123* 60*  CREATININE 3.93* 4.18* 4.17* 4.36* 4.73* 3.01*  CALCIUM 10.0 10.3 10.2 10.1 9.2 9.3  MG 2.0  --  1.8  --   --   --   PHOS  --   --   --  4.1 4.0  --     CBC: Recent Labs  Lab 01/05/22 0512 01/06/22 0532 01/07/22 0629 01/08/22 0941 01/09/22 0959  WBC 10.5 12.2* 10.4 11.6* 11.1*  HGB 8.4* 8.3* 9.1* 8.4* 8.1*  HCT 26.4* 25.7* 28.5* 26.1* 25.7*  MCV 92.3 92.8 93.4 93.9 93.1  PLT 393 395 435* 445* 396     Urinalysis: No results for input(s): "COLORURINE", "LABSPEC", "PHURINE", "GLUCOSEU", "HGBUR", "BILIRUBINUR", "KETONESUR", "PROTEINUR", "UROBILINOGEN", "NITRITE", "LEUKOCYTESUR" in the last 72 hours.  Invalid input(s): "APPERANCEUR"     Imaging: DG Chest Port 1 View  Result Date: 01/10/2022 CLINICAL DATA:  New onset fever and shortness of breath. EXAM: PORTABLE CHEST 1 VIEW COMPARISON:  12/31/2021 FINDINGS: 0833 hours. Low volume film. Cardiopericardial silhouette is at upper limits of normal for size. There is pulmonary vascular congestion without overt pulmonary edema. Minimal atelectasis or scarring noted left base. No overt edema or focal airspace consolidation. Right IJ dialysis catheter tip overlies the right atrium. Telemetry leads overlie the chest. IMPRESSION: Pulmonary vascular congestion without overt pulmonary edema. Electronically Signed   By: Misty Stanley M.D.   On: 01/10/2022 10:31   PERIPHERAL VASCULAR CATHETERIZATION  Result Date: 01/09/2022 See surgical note for result.    Medications:     (feeding supplement) PROSource Plus  30 mL Oral BID BM   vitamin C  500 mg Oral BID   atorvastatin  40 mg Oral Daily   Chlorhexidine Gluconate Cloth  6 each Topical Q0600   fluticasone  2 spray Each Nare Daily   furosemide  40 mg Oral Daily   gabapentin  100 mg Oral TID   insulin aspart  0-5 Units Subcutaneous QHS   insulin aspart  0-9 Units Subcutaneous TID WC   insulin aspart  3 Units Subcutaneous TID  WC   insulin glargine-yfgn  20 Units Subcutaneous BID   loratadine  10 mg Oral Daily   metoprolol succinate  50 mg Oral BID   multivitamin  1 tablet Oral QHS   nutrition supplement (JUVEN)  1 packet Oral BID BM   zinc sulfate  220 mg Oral Daily    Assessment/ Plan:     Principal Problem:   Severe sepsis (HCC) Active Problems:   OSA (obstructive sleep apnea)   Chronic kidney disease, stage 5 (HCC)   Essential hypertension   DNR (do not resuscitate)/DNI(Do Not Intubate)   Type 2 diabetes mellitus with chronic kidney disease, with long-term current use of insulin (HCC)   Hypomagnesemia   Pressure injury of skin   Symptomatic anemia   Elevated LFTs   Sacral wound, initial encounter   Swelling of  left lower extremity   Abdominal pain   AKI (acute kidney injury) ([place])   Hyponatremia   Premature atrial complexes   Anasarca associated with disorder of kidney   Fever  66 year old female with history of hypertension, diabetes, peripheral vascular disease, hyperlipidemia, now with sepsis and acute kidney injury on the top of chronic kidney disease.  Patient is reached end-stage renal disease and was started on renal replacement therapy.   #1: End-stage renal disease: Patient has a permacath placed.  Presently on dialysis and tolerating treatment well.  Next dialysis will be on Tuesday.   #2: Sepsis/fever: Patient has sacral decub ulcers.  Now off of antibiotics.     #3: Anemia: Anemia secondary to chronic kidney disease complicated by sepsis.  S/p 1 unit of PRBC.  We will continue anemia protocol.  Will monitor closely.   #4: Secondary hyperparathyroidism: Calcium and phosphorus levels are within range.  We will continue to monitor closely.   #5: Metabolic acidosis: CO2 improved.  Will discontinue sodium bicarbonate   #6: Congestive heart failure: We will decrease the dose of furosemide to 40 mg daily.   We will continue to monitor closely.   LOS: [place], [facility] 6/11/202312:02 PM

## 2022-01-11 NOTE — Progress Notes (Signed)
PROGRESS NOTE    Theresa Warren  ZOX:[id number] DOB: Jun 25, 1956 DOA: 12/31/2021 PCP: Crist Infante, MD    Brief Narrative:  Theresa Warren is a 66 year old female hyperlipidemia, hypertension, CKD stage V, insulin-dependent diabetes mellitus, who presents emergency department from [facility] for evaluation of low sodium noted on labs at the facility.  She was discharged to rehab on 5/23 after being admitted following a fall and also with acute on chronic anemia.   ED Course - temp 99.6, HR 107, otherwise unremarkable vitals. Labs with sodium 129, potassium 5.1, chloride 99, bicarb 17, BUN of 100, serum creatinine of 4.61, GFR of 10, WBC 18.4, hemoglobin 6.9, platelets of 473.  LFT's were also elevated.   Patient met criteria for sepsis on arrival, treated per protocol in the ED with IV Cefepime, Flagyl, Vanc, and LR 1 L bolus fluids.  Infection source unclear but felt possibly due to sacral wound infection and concern for underlying osteomyelitis.   Admitted to the hospitalist service for further evaluation and management.  Continued on IV Cefepime and Vanc.   General surgery was consulted for debridement on sacral pressure injury.  Nephrology consulted due to worsening renal function  6/7 complaining of pain where the wound VAC is after being changed today no other complaints. 6/8 discussed with pt about SNF as she lives alone and is weak.she will reconsider it. Messaged case mx to discuss options. 6/9 confused and mildly lethargic this am.plan for HD today as displaying uremic sx. 6/10 she feels better today.  Less lethargic and confused for me today.  Status post tunneled dialysis catheter right IJ by vascular surgery on 6/9 first HD was started yesterday.Febrile yesterday afternoon 6/11 too sleepy this am to participate with exam. When I asked if she plans to eat her breakfast, responded when I get up.  Consultants:  Surgery, vascular surgery, nephrology  Procedures:    Antimicrobials:  Keflex   Subjective: As above  Objective: Vitals:   01/10/22 1801 01/11/22 0018 01/11/22 0411 01/11/22 0500  BP: (!) 135/46 (!) 153/49 (!) 137/50   Pulse: 88 91 88   Resp: 18 18 16    Temp: 98.2 F (36.8 C) (!) 100.5 F (38.1 C) 100.1 F (37.8 C)   TempSrc:      SpO2: 98% 96% 93%   Weight:    98 kg  Height:        Intake/Output Summary (Last 24 hours) at 01/11/2022 0814 Last data filed at 01/10/2022 2226 Gross per 24 hour  Intake 360 ml  Output 500 ml  Net -140 ml   Filed Weights   01/10/22 0500 01/10/22 1612 01/11/22 0500  Weight: 101.2 kg 98 kg 98 kg    Examination: Calm, NAD, very sleepy. Cta no w/r anteriorly Reg s1/s2 no gallop Soft benign +bs No edema Unable to evalute Mood and affect appropriate in current setting     Data Reviewed: I have personally reviewed following labs and imaging studies  CBC: Recent Labs  Lab 01/05/22 0512 01/06/22 0532 01/07/22 0629 01/08/22 0941 01/09/22 0959  WBC 10.5 12.2* 10.4 11.6* 11.1*  HGB 8.4* 8.3* 9.1* 8.4* 8.1*  HCT 26.4* 25.7* 28.5* 26.1* 25.7*  MCV 92.3 92.8 93.4 93.9 93.1  PLT 393 395 435* 445* 811   Basic Metabolic Panel: Recent Labs  Lab 01/05/22 0512 01/06/22 0532 01/07/22 0629 01/08/22 0941 01/09/22 0959 01/11/22 0625  NA 134* 134* 132* 134* 137 135  K 4.8 4.7 4.9 4.9 4.7 3.6  CL 102 99  96* 95* 100 96*  CO2 20* 23 24 27 26 28   GLUCOSE 193* 214* 215* 145* 87 99  BUN 98* 121* 120* 128* 123* 60*  CREATININE 3.93* 4.18* 4.17* 4.36* 4.73* 3.01*  CALCIUM 10.0 10.3 10.2 10.1 9.2 9.3  MG 2.0  --  1.8  --   --   --   PHOS  --   --   --  4.1 4.0  --    GFR: Estimated Creatinine Clearance: 21 mL/min (A) (by C-G formula based on SCr of 3.01 mg/dL (H)). Liver Function Tests: Recent Labs  Lab 01/05/22 0512 01/06/22 0532 01/08/22 0941 01/09/22 0959  AST 125* 128*  --   --   ALT 76* 75*  --   --   ALKPHOS 286* 257*  --   --   BILITOT 0.5 0.6  --   --   PROT 6.8 7.4  --    --   ALBUMIN 3.0* 3.3* 2.9* 2.5*   No results for input(s): "LIPASE", "AMYLASE" in the last 168 hours. No results for input(s): "AMMONIA" in the last 168 hours. Coagulation Profile: No results for input(s): "INR", "PROTIME" in the last 168 hours.  Cardiac Enzymes: No results for input(s): "CKTOTAL", "CKMB", "CKMBINDEX", "TROPONINI" in the last 168 hours. BNP (last 3 results) No results for input(s): "PROBNP" in the last 8760 hours. HbA1C: No results for input(s): "HGBA1C" in the last 72 hours. CBG: Recent Labs  Lab 01/09/22 2146 01/10/22 0725 01/10/22 1157 01/10/22 1710 01/10/22 2150  GLUCAP 102* 105* 165* 117* 138*   Lipid Profile: No results for input(s): "CHOL", "HDL", "LDLCALC", "TRIG", "CHOLHDL", "LDLDIRECT" in the last 72 hours. Thyroid Function Tests: No results for input(s): "TSH", "T4TOTAL", "FREET4", "T3FREE", "THYROIDAB" in the last 72 hours. Anemia Panel: No results for input(s): "VITAMINB12", "FOLATE", "FERRITIN", "TIBC", "IRON", "RETICCTPCT" in the last 72 hours. Sepsis Labs: No results for input(s): "PROCALCITON", "LATICACIDVEN" in the last 168 hours.   Recent Results (from the past 240 hour(s))  MRSA Next Gen by PCR, Nasal     Status: None   Collection Time: 01/01/22 10:17 AM   Specimen: Nasal Mucosa; Nasal Swab  Result Value Ref Range Status   MRSA by PCR Next Gen NOT DETECTED NOT DETECTED Final    Comment: (NOTE) The GeneXpert MRSA Assay (FDA approved for NASAL specimens only), is one component of a comprehensive MRSA colonization surveillance program. It is not intended to diagnose MRSA infection nor to guide or monitor treatment for MRSA infections. Test performance is not FDA approved in patients less than 38 years old. Performed at [facility], [place], [place] [address]   Aerobic/Anaerobic Culture w Gram Stain (surgical/deep wound)     Status: None   Collection Time: 01/01/22  3:37 PM   Specimen: PATH Soft tissue   Result Value Ref Range Status   Specimen Description TISSUE  Final   Special Requests BUTTOCKS WOUND  Final   Gram Stain   Final    NO SQUAMOUS EPITHELIAL CELLS SEEN FEW WBC SEEN RARE GRAM POSITIVE RODS FEW GRAM POSITIVE COCCI    Culture   Final    FEW STAPHYLOCOCCUS AUREUS RARE ENTEROCOCCUS FAECALIS NO ANAEROBES ISOLATED Performed at [facility], [address], [place], [place] [address]    Report Status 01/06/2022 FINAL  Final   Organism ID, Bacteria STAPHYLOCOCCUS AUREUS  Final   Organism ID, Bacteria ENTEROCOCCUS FAECALIS  Final      Susceptibility   Enterococcus faecalis -  MIC*    AMPICILLIN <=2 SENSITIVE Sensitive     VANCOMYCIN 1 SENSITIVE Sensitive     GENTAMICIN SYNERGY SENSITIVE Sensitive     * RARE ENTEROCOCCUS FAECALIS   Staphylococcus aureus - MIC*    CIPROFLOXACIN >=8 RESISTANT Resistant     ERYTHROMYCIN >=8 RESISTANT Resistant     GENTAMICIN <=0.5 SENSITIVE Sensitive     OXACILLIN <=0.25 SENSITIVE Sensitive     TETRACYCLINE <=1 SENSITIVE Sensitive     VANCOMYCIN 1 SENSITIVE Sensitive     TRIMETH/SULFA <=10 SENSITIVE Sensitive     CLINDAMYCIN >=8 RESISTANT Resistant     RIFAMPIN <=0.5 SENSITIVE Sensitive     Inducible Clindamycin NEGATIVE Sensitive     * FEW STAPHYLOCOCCUS AUREUS  Aerobic/Anaerobic Culture w Gram Stain (surgical/deep wound)     Status: None   Collection Time: 01/01/22  3:46 PM   Specimen: PATH Soft tissue  Result Value Ref Range Status   Specimen Description TISSUE  Final   Special Requests BUTTOCKS WOUND NO 2  Final   Gram Stain   Final    NO SQUAMOUS EPITHELIAL CELLS SEEN FEW WBC SEEN FEW GRAM POSITIVE COCCI    Culture   Final    MODERATE STAPHYLOCOCCUS AUREUS FEW STAPHYLOCOCCUS EPIDERMIDIS NO ANAEROBES ISOLATED Performed at [facility], [place] [address], [place], [place] [address]    Report Status 01/06/2022 FINAL  Final   Organism ID, Bacteria STAPHYLOCOCCUS AUREUS  Final   Organism ID, Bacteria STAPHYLOCOCCUS  EPIDERMIDIS  Final      Susceptibility   Staphylococcus aureus - MIC*    CIPROFLOXACIN >=8 RESISTANT Resistant     ERYTHROMYCIN >=8 RESISTANT Resistant     GENTAMICIN <=0.5 SENSITIVE Sensitive     OXACILLIN <=0.25 SENSITIVE Sensitive     TETRACYCLINE <=1 SENSITIVE Sensitive     VANCOMYCIN 1 SENSITIVE Sensitive     TRIMETH/SULFA <=10 SENSITIVE Sensitive     CLINDAMYCIN >=8 RESISTANT Resistant     RIFAMPIN <=0.5 SENSITIVE Sensitive     Inducible Clindamycin NEGATIVE Sensitive     * MODERATE STAPHYLOCOCCUS AUREUS   Staphylococcus epidermidis - MIC*    CIPROFLOXACIN 4 RESISTANT Resistant     ERYTHROMYCIN >=8 RESISTANT Resistant     GENTAMICIN <=0.5 SENSITIVE Sensitive     OXACILLIN >=4 RESISTANT Resistant     TETRACYCLINE 2 SENSITIVE Sensitive     VANCOMYCIN 1 SENSITIVE Sensitive     TRIMETH/SULFA <=10 SENSITIVE Sensitive     CLINDAMYCIN >=8 RESISTANT Resistant     RIFAMPIN <=0.5 SENSITIVE Sensitive     Inducible Clindamycin NEGATIVE Sensitive     * FEW STAPHYLOCOCCUS EPIDERMIDIS  Culture, blood (Routine X 2) w Reflex to ID Panel     Status: None (Preliminary result)   Collection Time: 01/10/22  8:48 AM   Specimen: BLOOD  Result Value Ref Range Status   Specimen Description BLOOD BLOOD RIGHT HAND  Final   Special Requests   Final    BOTTLES DRAWN AEROBIC AND ANAEROBIC Blood Culture results may not be optimal due to an inadequate volume of blood received in culture bottles   Culture   Final    NO GROWTH < 24 HOURS Performed at [facility], [address] [place], [place] [address]    Report Status PENDING  Incomplete  Culture, blood (Routine X 2) w Reflex to ID Panel     Status: None (Preliminary result)   Collection Time: 01/10/22  9:01 AM   Specimen: BLOOD  Result Value Ref Range  Status   Specimen Description BLOOD LEFT ANTECUBITAL  Final   Special Requests   Final    BOTTLES DRAWN AEROBIC AND ANAEROBIC Blood Culture adequate volume   Culture   Final    NO  GROWTH < 24 HOURS Performed at [facility], [address], [place], [place] [address]    Report Status PENDING  Incomplete         Radiology Studies: DG Chest Port 1 View  Result Date: 01/10/2022 CLINICAL DATA:  New onset fever and shortness of breath. EXAM: PORTABLE CHEST 1 VIEW COMPARISON:  12/31/2021 FINDINGS: 0833 hours. Low volume film. Cardiopericardial silhouette is at upper limits of normal for size. There is pulmonary vascular congestion without overt pulmonary edema. Minimal atelectasis or scarring noted left base. No overt edema or focal airspace consolidation. Right IJ dialysis catheter tip overlies the right atrium. Telemetry leads overlie the chest. IMPRESSION: Pulmonary vascular congestion without overt pulmonary edema. Electronically Signed   By: Misty Stanley M.D.   On: 01/10/2022 10:31   PERIPHERAL VASCULAR CATHETERIZATION  Result Date: 01/09/2022 See surgical note for result.       Scheduled Meds:  (feeding supplement) PROSource Plus  30 mL Oral BID BM   vitamin C  500 mg Oral BID   atorvastatin  40 mg Oral Daily   Chlorhexidine Gluconate Cloth  6 each Topical Q0600   fluticasone  2 spray Each Nare Daily   furosemide  40 mg Oral Daily   gabapentin  100 mg Oral TID   insulin aspart  0-5 Units Subcutaneous QHS   insulin aspart  0-9 Units Subcutaneous TID WC   insulin aspart  3 Units Subcutaneous TID WC   insulin glargine-yfgn  20 Units Subcutaneous BID   loratadine  10 mg Oral Daily   metoprolol succinate  50 mg Oral BID   multivitamin  1 tablet Oral QHS   nutrition supplement (JUVEN)  1 packet Oral BID BM   zinc sulfate  220 mg Oral Daily   Continuous Infusions:    Assessment & Plan:   Principal Problem:   Severe sepsis ([place]) Active Problems:   Chronic kidney disease, stage 5 ([place])   Essential hypertension   DNR (do not resuscitate)/DNI(Do Not Intubate)   Type 2 diabetes mellitus with chronic kidney disease, with long-term current use  of insulin (HCC)   Sacral wound, initial encounter   Swelling of left lower extremity   Abdominal pain   AKI (acute kidney injury) ([place])   Hypomagnesemia   Symptomatic anemia   Hyponatremia   Premature atrial complexes   OSA (obstructive sleep apnea)   Pressure injury of skin   Elevated LFTs   Anasarca associated with disorder of kidney   Fever    * Severe sepsis (HCC) POA as evidenced by tachycardia, leukocytosis, AKI and transaminitis consistent with organ dysfunction and severe sepsis with suspected infection source being sacral wounds versus an abdominal source (CT abdomen pelvis negative for anything acute however).  MRSA PCR negative. Wound cultures grew few Staph aureus, rare Enterococcus faecalis. Initially treated with vancomycin/cefepime. Vanc stopped 6/4. Plan is to treat MSSA in both cultures.  Enterococcus likely contaminated from stool d/T location of wounds and MRSE likely colonization of skin  6/11 continue with keflex    Fever On 6/9 evening Ck bcx Continue keflex for now.  6/11 ck procalcit. Start meropenem for broad coverage Has no foley Bcx pending     Sacral Decubitus wound Continue abx as above Gsx following,  felt wounds stable and signed off.  S/p debridement of three decubitus wounds of b/l buttocks and wound vac placed No further debridment at this time. Healing challenging given her immobility , poor functional status, and poor nutritional status. Local wound care; pressure offload, freq. Repositioning, low air loss mattress.  Pain control prn 6/11 continue wound vac : change MWF; [place] RN on board to assist with changes   Type 2 diabetes mellitus with chronic kidney disease, with long-term current use of insulin (HCC) BG on lower side today.  We will decrease Levemir to 15 units twice daily and DC the NovoLog with meals Encourage p.o. intake    Essential hypertension Losartan held due to AKI. On p.o. Lasix started by nephrology  IV  hydralazine as needed  6/10 stable Continue to monitor     AKI on Chronic kidney disease, stage 5 (HCC) Hyponatremia Treated IV Lasix and albumin per nephrology.  6/7 bilateral lower extremity improving AKI secondary to underlying illness  CKD secondary to diabetes and hypertension  No exposure to IV contrast  CT abdomen pelvis negative for obstruction, several renal stones identified.   Losartan held due to AKI  Now switched to Lasix p.o. 40 mg twice daily  6/8 albumin was  stopped 6/9 exhibiting uremic symptoms today.  6/10 status post permacath placement on 6/9 by Dr. Delana Meyer first HD on 6/9 . Uremic sx appears to be improving. MS improved HD again today        Abdominal pain Patient reported on admission.  CT of abdomen pelvis showed abnormal endometrial thickening for a postmenopausal patient, bladder distention estimated 520 mL, no other acute or inflammatory processes or acute findings. --Monitor clinically for now -- Needs OB/GYN follow-up for endometrial sampling to exclude endometrial carcinoma 6/10 no further complaints    Swelling of left lower extremity With redness, patient attributes it to clindamycin.  She reported on admission that this has been present for over one month.   Left lower extremity Doppler US on 12/18/21 was negative for DVT.       Symptomatic anemia Hemoglobin on admission was 6.9 >> 6.3, down from 7.6 at time of discharge on 5/23.  No signs of active bleeding, did have vaginal/endometrial bleeding previous admission but no reports of this recently.  May have slow chronic blood loss from sacral wounds.  Has baseline anemia of chronic disease due to renal failure. Received 1 unit PRBC transfusion on admission. -- Transfusing another unit RBCs today -- Trend hemoglobin and transfuse if less than 7   Hypomagnesemia Resolved with replacement on 6/3. Monitor and replace Mg as needed.     Premature atrial complexes Patient became  significantly tachycardic on 6/2 with heart rates sustaining in the 130s.  Cardiology reviewed telemetry and feels patient dysrhythmias were not A-fib but sinus tach with PACs. --Cardiology following On beta blk     Elevated LFTs On admission, alk phos 212, AST 182, ALT 107.  Unclear etiology, repeat LFTs not available today.  Appears LFTs were elevated at time of discharge on 5/23 but have increased since. RUQ ultrasound unremarkable. Acute hepatitis panel negative.  Pressure injury of skin Present on admission.  Patient has multiple sacral pressure injuries.  Some concern at time of admission that these were the source of sepsis. General surgery consulted and took patient to the OR on 6/1 for debridement.   Now with wound VAC. --Initial wound VAC change Monday 6/5 then every M/W/F -- Wound care consulted -- Frequent repositioning --Follow  general surgery recommendations --Pain control --Follow blood cultures Pressure Injury 01/01/22 Buttocks Left Stage 3 -  Full thickness tissue loss. Subcutaneous fat may be visible but bone, tendon or muscle are NOT exposed. (Active)  01/01/22 0222  Location: Buttocks  Location Orientation: Left  Staging: Stage 3 -  Full thickness tissue loss. Subcutaneous fat may be visible but bone, tendon or muscle are NOT exposed.  Wound Description (Comments):   Present on Admission: Yes     Pressure Injury 01/01/22 Buttocks Right Stage 2 -  Partial thickness loss of dermis presenting as a shallow open injury with a red, pink wound bed without slough. (Active)  01/01/22 0223  Location: Buttocks  Location Orientation: Right  Staging: Stage 2 -  Partial thickness loss of dermis presenting as a shallow open injury with a red, pink wound bed without slough.  Wound Description (Comments):   Present on Admission: Yes     Pressure Injury 01/01/22 Hip Left Stage 2 -  Partial thickness loss of dermis presenting as a shallow open injury with a red, pink wound bed  without slough. (Active)  01/01/22 0225  Location: Hip  Location Orientation: Left  Staging: Stage 2 -  Partial thickness loss of dermis presenting as a shallow open injury with a red, pink wound bed without slough.  Wound Description (Comments):   Present on Admission: Yes     Pressure Injury 01/01/22 Hip Right Stage 3 -  Full thickness tissue loss. Subcutaneous fat may be visible but bone, tendon or muscle are NOT exposed. (Active)  01/01/22 0225  Location: Hip  Location Orientation: Right  Staging: Stage 3 -  Full thickness tissue loss. Subcutaneous fat may be visible but bone, tendon or muscle are NOT exposed.  Wound Description (Comments):   Present on Admission: Yes          OSA (obstructive sleep apnea) .  Appears not to be on CPAP       DVT prophylaxis: SCD Code Status: DNR Family Communication: None at bedside Disposition Plan:  Status is: Inpatient Remains inpatient appropriate because: renal function worsening, on HD now, needs HD as outpatient, needs SNF      LOS: 10 days   Time spent 35 minutes with more than 50% on [place], MD [facility] Pager [phone]  If 7PM-7AM, please contact night-coverage 01/11/2022, 8:14 AM

## 2022-01-11 NOTE — TOC Progression Note (Signed)
Transition of Care [facility]) - Progression Note    Patient Details  Name: Theresa Warren MRN: [id number] Date of Birth: 01-22-1956  Transition of Care [facility]) CM/SW Contact  Izola Price, RN Phone Number: 01/11/2022, 2:34 PM  Clinical Narrative:  6/11: Patient very lethargic and confused today per OT notes. Provider notes also indicated too sleepy to participate in exam. Did verbally respond to provider. RN also aware of this status. HS yesterday and nephrology following. Too lethargic/mild confusion to agree to bed offers. Patient requested to be informed of offers prior to agreeing to transfer per prior CM notes on 01/08/22. Simmie Davies RN CM     Expected Discharge Plan:  (TBD) Barriers to Discharge: Continued Medical Work up  Expected Discharge Plan and Services Expected Discharge Plan:  (TBD)   Discharge Planning Services: CM Consult Post Acute Care Choice:  (TBD) Living arrangements for the past 2 months: Single Family Home                                       Social Determinants of Health (SDOH) Interventions    Readmission Risk Interventions     No data to display

## 2022-01-12 ENCOUNTER — Inpatient Hospital Stay: Payer: Medicare Other

## 2022-01-12 ENCOUNTER — Encounter: Payer: Self-pay | Admitting: Vascular Surgery

## 2022-01-12 DIAGNOSIS — S31809A Unspecified open wound of unspecified buttock, initial encounter: Secondary | ICD-10-CM | POA: Diagnosis not present

## 2022-01-12 DIAGNOSIS — R509 Fever, unspecified: Secondary | ICD-10-CM | POA: Diagnosis not present

## 2022-01-12 DIAGNOSIS — L89323 Pressure ulcer of left buttock, stage 3: Secondary | ICD-10-CM | POA: Diagnosis not present

## 2022-01-12 DIAGNOSIS — R600 Localized edema: Secondary | ICD-10-CM | POA: Diagnosis not present

## 2022-01-12 DIAGNOSIS — L89313 Pressure ulcer of right buttock, stage 3: Secondary | ICD-10-CM | POA: Diagnosis not present

## 2022-01-12 DIAGNOSIS — R6520 Severe sepsis without septic shock: Secondary | ICD-10-CM | POA: Diagnosis not present

## 2022-01-12 DIAGNOSIS — A419 Sepsis, unspecified organism: Secondary | ICD-10-CM | POA: Diagnosis not present

## 2022-01-12 DIAGNOSIS — R5082 Postprocedural fever: Secondary | ICD-10-CM | POA: Diagnosis not present

## 2022-01-12 LAB — CBC WITH DIFFERENTIAL/PLATELET
Abs Immature Granulocytes: 0.14 10*3/uL — ABNORMAL HIGH (ref 0.00–0.07)
Basophils Absolute: 0 10*3/uL (ref 0.0–0.1)
Basophils Relative: 0 %
Eosinophils Absolute: 0.3 10*3/uL (ref 0.0–0.5)
Eosinophils Relative: 2 %
HCT: 23.5 % — ABNORMAL LOW (ref 36.0–46.0)
Hemoglobin: 7.4 g/dL — ABNORMAL LOW (ref 12.0–15.0)
Immature Granulocytes: 1 %
Lymphocytes Relative: 8 %
Lymphs Abs: 1.1 10*3/uL (ref 0.7–4.0)
MCH: 29.6 pg (ref 26.0–34.0)
MCHC: 31.5 g/dL (ref 30.0–36.0)
MCV: 94 fL (ref 80.0–100.0)
Monocytes Absolute: 1.2 10*3/uL — ABNORMAL HIGH (ref 0.1–1.0)
Monocytes Relative: 9 %
Neutro Abs: 11 10*3/uL — ABNORMAL HIGH (ref 1.7–7.7)
Neutrophils Relative %: 80 %
Platelets: 270 10*3/uL (ref 150–400)
RBC: 2.5 MIL/uL — ABNORMAL LOW (ref 3.87–5.11)
RDW: 14.7 % (ref 11.5–15.5)
WBC: 13.7 10*3/uL — ABNORMAL HIGH (ref 4.0–10.5)
nRBC: 0 % (ref 0.0–0.2)

## 2022-01-12 LAB — CBC
HCT: 23.2 % — ABNORMAL LOW (ref 36.0–46.0)
Hemoglobin: 7.4 g/dL — ABNORMAL LOW (ref 12.0–15.0)
MCH: 29.5 pg (ref 26.0–34.0)
MCHC: 31.9 g/dL (ref 30.0–36.0)
MCV: 92.4 fL (ref 80.0–100.0)
Platelets: 255 10*3/uL (ref 150–400)
RBC: 2.51 MIL/uL — ABNORMAL LOW (ref 3.87–5.11)
RDW: 15 % (ref 11.5–15.5)
WBC: 13.6 10*3/uL — ABNORMAL HIGH (ref 4.0–10.5)
nRBC: 0 % (ref 0.0–0.2)

## 2022-01-12 LAB — HEPATIC FUNCTION PANEL
ALT: 156 U/L — ABNORMAL HIGH (ref 0–44)
AST: 262 U/L — ABNORMAL HIGH (ref 15–41)
Albumin: 2 g/dL — ABNORMAL LOW (ref 3.5–5.0)
Alkaline Phosphatase: 299 U/L — ABNORMAL HIGH (ref 38–126)
Bilirubin, Direct: 0.2 mg/dL (ref 0.0–0.2)
Indirect Bilirubin: 0.7 mg/dL (ref 0.3–0.9)
Total Bilirubin: 0.9 mg/dL (ref 0.3–1.2)
Total Protein: 6.3 g/dL — ABNORMAL LOW (ref 6.5–8.1)

## 2022-01-12 LAB — RENAL FUNCTION PANEL
Albumin: 2.1 g/dL — ABNORMAL LOW (ref 3.5–5.0)
Anion gap: 13 (ref 5–15)
BUN: 70 mg/dL — ABNORMAL HIGH (ref 8–23)
CO2: 26 mmol/L (ref 22–32)
Calcium: 8.8 mg/dL — ABNORMAL LOW (ref 8.9–10.3)
Chloride: 95 mmol/L — ABNORMAL LOW (ref 98–111)
Creatinine, Ser: 5.02 mg/dL — ABNORMAL HIGH (ref 0.44–1.00)
GFR, Estimated: 9 mL/min — ABNORMAL LOW (ref >60)
Glucose, Bld: 124 mg/dL — ABNORMAL HIGH (ref 70–99)
Phosphorus: 5 mg/dL — ABNORMAL HIGH (ref 2.5–4.6)
Potassium: 3.8 mmol/L (ref 3.5–5.1)
Sodium: 134 mmol/L — ABNORMAL LOW (ref 135–145)

## 2022-01-12 LAB — GLUCOSE, CAPILLARY
Glucose-Capillary: 88 mg/dL (ref 70–99)
Glucose-Capillary: 227 mg/dL — ABNORMAL HIGH (ref 70–99)
Glucose-Capillary: 244 mg/dL — ABNORMAL HIGH (ref 70–99)

## 2022-01-12 MED ORDER — HEPARIN SODIUM (PORCINE) 1000 UNIT/ML IJ SOLN
INTRAMUSCULAR | Status: AC
  Filled 2022-01-12: qty 10

## 2022-01-12 MED ORDER — MEDIHONEY WOUND/BURN DRESSING EX PSTE
1.0000 "application " | PASTE | Freq: Every day | CUTANEOUS | Status: DC
  Administered 2022-01-13 – 2022-01-31 (×16): 1 via TOPICAL
  Filled 2022-01-12 (×6): qty 44

## 2022-01-12 MED ORDER — HEPARIN SODIUM (PORCINE) 5000 UNIT/ML IJ SOLN
5000.0000 [IU] | Freq: Three times a day (TID) | INTRAMUSCULAR | Status: DC
  Administered 2022-01-12 – 2022-01-18 (×17): 5000 [IU] via SUBCUTANEOUS
  Filled 2022-01-12 (×18): qty 1

## 2022-01-12 MED ORDER — MORPHINE SULFATE (PF) 2 MG/ML IV SOLN
INTRAVENOUS | Status: AC
  Filled 2022-01-12: qty 1

## 2022-01-12 MED ORDER — SODIUM CHLORIDE 0.9 % IV SOLN
500.0000 mg | INTRAVENOUS | Status: AC
  Administered 2022-01-12 – 2022-01-19 (×8): 500 mg via INTRAVENOUS
  Filled 2022-01-12 (×3): qty 500
  Filled 2022-01-12: qty 10
  Filled 2022-01-12: qty 500
  Filled 2022-01-12: qty 10
  Filled 2022-01-12: qty 500
  Filled 2022-01-12: qty 10
  Filled 2022-01-12: qty 500

## 2022-01-12 NOTE — Consult Note (Signed)
Pharmacy Antibiotic Note  Theresa Warren is a 66 y.o. female admitted on 12/31/2021 with ESRD and a gluteal abscess wound infection.  Pharmacy has been consulted for meropenem dosing.  6/12: D2 abx w/ anaerobic coverage for E.Faecailis/MSSA gluteal wound. MD requests escalation to meropenem given patient continues to spike fevers on antibiotic therapy  Plan: Give meropenem 500mg  IV every evening    Height: 5\' 3"  (160 cm) Weight: 102.4 kg (225 lb 12 oz) IBW/kg (Calculated) : 52.4  Temp (24hrs), Avg:100.1 F (37.8 C), Min:98.2 F (36.8 C), Max:102.4 F (39.1 C)  Recent Labs  Lab 01/06/22 0532 01/07/22 0629 01/08/22 0941 01/09/22 0959 01/11/22 0625 01/11/22 1752 01/12/22 1054  WBC 12.2* 10.4 11.6* 11.1*  --  11.3* 13.6*  CREATININE 4.18* 4.17* 4.36* 4.73* 3.01*  --   --     Estimated Creatinine Clearance: 21.6 mL/min (A) (by C-G formula based on SCr of 3.01 mg/dL (H)).    Allergies  Allergen Reactions   Codeine Nausea And Vomiting   Sulfa Antibiotics Swelling   Clindamycin/Lincomycin Rash   Penicillins Rash    Microbiology results:      Thank you for allowing pharmacy to be a part of this patient's care.  Darrick Penna 01/12/2022 11:54 AM

## 2022-01-12 NOTE — Progress Notes (Signed)
OT Cancellation Note  Patient Details Name: Theresa Warren MRN: [id number] DOB: 04-15-1956   Cancelled Treatment:    Reason Eval/Treat Not Completed: Patient at procedure or test/ unavailable. OT continues to follow pt for treatment. Pt noted to be OTF for HD this date. Will continue to follow and re-attempt at a later date/time as available and pt medically appropriate for OT services.   Shara Blazing, M.S., OTR/L Ascom: [phone] 01/12/22, 2:50 PM

## 2022-01-12 NOTE — Progress Notes (Signed)
[facility] Day(s): 11.   Post op day(s): 3 Days Post-Op.   Interval History:  Had signed off on Friday (06/09) after vac change.  Asked to see the patient again secondary to new fevers. (T-Max 102.13F at 0700) Patient seen and examined in HD unit She reports that her pain in her legs had been improving, especially when not moving. She reports pain typically starts when she is active.  She did have wound vac changed already this morning with WOC; note reviewed  Again, Cx staph aureus and epidermidis; sensitivities reviewed. She was on Keflex (started 06/05); broadened to meropenem today  PT/OT notes reviewed; recommendations SNF  Vital signs in last 24 hours: [min-max] current  Temp:  [98.2 F (36.8 C)-102.4 F (39.1 C)] 100.4 F (38 C) (06/12 0836) Pulse Rate:  [66-98] 82 (06/12 0836) Resp:  [18-21] 21 (06/12 0841) BP: (117-153)/(48-84) 128/51 (06/12 0836) SpO2:  [90 %-99 %] 99 % (06/12 0836) Weight:  [102.4 kg] 102.4 kg (06/12 0330)     Height: 5\' 3"  (160 cm) Weight: 102.4 kg BMI (Calculated): 40   Intake/Output last 2 shifts:  06/11 0701 - 06/12 0700 In: 200 [IV Piggyback:200] Out: 61 [Urine:690]   Physical Exam:  Constitutional: alert, cooperative and no distress  Respiratory: breathing non-labored at rest  Cardiovascular: regular rate and sinus rhythm  Integumentary: Wound vac to the bilateral buttock, bridged to the left anterior hip. Good seal. I reviewed WOC RN note from change this morning. On evaluation of her lower extremities, she has a few scattered areas of induration throughout the left leg, there is erythema as well, worse seem to be on the left calf and left hip. I do not appreciate any gross fluctuance. Her extremities remain edematous, 2+ bilaterally, this is unchanged.   Labs:     Latest Ref Rng & Units 01/12/2022   10:54 AM 01/11/2022    5:52 PM 01/09/2022    9:59 AM  CBC  WBC 4.0 - 10.5 K/uL 13.6  11.3   11.1   Hemoglobin 12.0 - 15.0 g/dL 7.4  7.9  8.1   Hematocrit 36.0 - 46.0 % 23.2  25.2  25.7   Platelets 150 - 400 K/uL 255  295  396       Latest Ref Rng & Units 01/11/2022    6:25 AM 01/09/2022    9:59 AM 01/08/2022    9:41 AM  CMP  Glucose 70 - 99 mg/dL 99  87  145   BUN 8 - 23 mg/dL 60  123  128   Creatinine 0.44 - 1.00 mg/dL 3.01  4.73  4.36   Sodium 135 - 145 mmol/L 135  137  134   Potassium 3.5 - 5.1 mmol/L 3.6  4.7  4.9   Chloride 98 - 111 mmol/L 96  100  95   CO2 22 - 32 mmol/L 28  26  27    Calcium 8.9 - 10.3 mg/dL 9.3  9.2  10.1      Imaging studies: No new pertinent imaging studies   Assessment/Plan: 66 y.o. female now with fevers and concern for potential changes to LLE 11 days s/p debridement of three decubitus wounds of bilateral buttocks (total area 133 cm2) and placement of negative pressure dressing (> 50 cm).    - From a LLE perspective this morning, she certainly has scattered areas of erythema and induration but no obvious fluctuance nor abscess. I do think it is reasonable to reassess  these with soft tissue US to ensure no underlying fluid collections. DVT could certainly also be a culprit. Would not proceed with I&D until we have a known source/drainable collection. Again, I do worry that her immobility, poor functional status, and poor nutritional status will be significant barrier to wound healing. Would not do any sort of prophylactic I&D as to not create any more big wounds that will have a difficult time healing given the above.    - Continue wound vac; change MWF; [place] RN on-board to assist with changes, greatly appreciate their assistance. I reviewed notes. I will be available to assist if needed  - Continue Abx; broadened to Meropenem given fevers            - Local wound care; pressure offload, frequent repositioning, low air loss mattress           - Pain control prn           - Further management per primary service; we will follow along  All of the  above findings and recommendations were discussed with the patient, and the medical team, and all of patient's questions were answered to her expressed satisfaction.  -- Edison Simon, PA-C [facility] 01/12/2022, 11:29 AM M-F: 7am - 4pm

## 2022-01-12 NOTE — Consult Note (Signed)
ANTICOAGULATION CONSULT NOTE - Initial Consult  Pharmacy Consult for subQ heparin Indication: VTE prophylaxis  Allergies  Allergen Reactions   Codeine Nausea And Vomiting   Sulfa Antibiotics Swelling   Clindamycin/Lincomycin Rash   Penicillins Rash    Patient Measurements: Height: 5\' 3"  (160 cm) Weight: 102.4 kg (225 lb 12 oz) IBW/kg (Calculated) : 52.4 Heparin Dosing Weight: 102.4 kg  Vital Signs: Temp: 100.4 F (38 C) (06/12 0836) Temp Source: Oral (06/12 0836) BP: 128/51 (06/12 0836) Pulse Rate: 82 (06/12 0836)  Labs: Recent Labs    01/11/22 0625 01/11/22 1752 01/12/22 1054  HGB  --  7.9* 7.4*  HCT  --  25.2* 23.2*  PLT  --  295 255  CREATININE 3.01*  --   --     Estimated Creatinine Clearance: 21.6 mL/min (A) (by C-G formula based on SCr of 3.01 mg/dL (H)).   Medical History: Past Medical History:  Diagnosis Date   CKD (chronic kidney disease), stage III ([place])    Diabetes ([place])    Hyperlipidemia    Hypertension    Obesity     Medications:  PTA: N/A Inpatient: Heparin 1000 units PRN dialysis,  Allergies: No AC/APT related allergies  Assessment: 66 year old female hyperlipidemia, hypertension, CKD stage V, insulin-dependent diabetes mellitus, who presents emergency department from [place] for evaluation of low sodium noted on labs at the facility  Goal of Therapy:  Monitor platelets by anticoagulation protocol: Yes   Plan:  Give heparin 5,000 units subcutaneously every 8 hours  Darrick Penna 01/12/2022,11:43 AM

## 2022-01-12 NOTE — Progress Notes (Addendum)
Hemodialysis Post Treatment Note  January 12, 2022  Access:  Right Subclavian  CVC  UF Removed: 1500 ML  BFR: 300  Next Scheduled Treatment: 01/14/22  Note:  Patient presents for day 3 of a new patient start for hemodialysis, Right subclavian CVC intact, no signs of infection. CVC functions well. Patient tolerates treatment without incident, 1.5-liter fluid removal. Patient medicated for pain. Handoff given, patient transported to assigned room.

## 2022-01-12 NOTE — Progress Notes (Signed)
PROGRESS NOTE    Theresa Warren  SWF:[id number] DOB: Nov 09, 1955 DOA: 12/31/2021 PCP: Crist Infante, MD    Brief Narrative:  [name] is a 66 year old female hyperlipidemia, hypertension, CKD stage V, insulin-dependent diabetes mellitus, who presents emergency department from [facility] for evaluation of low sodium noted on labs at the facility.  She was discharged to rehab on 5/23 after being admitted following a fall and also with acute on chronic anemia.   ED Course - temp 99.6, HR 107, otherwise unremarkable vitals. Labs with sodium 129, potassium 5.1, chloride 99, bicarb 17, BUN of 100, serum creatinine of 4.61, GFR of 10, WBC 18.4, hemoglobin 6.9, platelets of 473.  LFT's were also elevated.   Patient met criteria for sepsis on arrival, treated per protocol in the ED with IV Cefepime, Flagyl, Vanc, and LR 1 L bolus fluids.  Infection source unclear but felt possibly due to sacral wound infection and concern for underlying osteomyelitis.   Admitted to the hospitalist service for further evaluation and management.  Continued on IV Cefepime and Vanc.   General surgery was consulted for debridement on sacral pressure injury.  Nephrology consulted due to worsening renal function  6/7 complaining of pain where the wound VAC is after being changed today no other complaints. 6/8 discussed with pt about SNF as she lives alone and is weak.she will reconsider it. Messaged case mx to discuss options. 6/9 confused and mildly lethargic this am.plan for HD today as displaying uremic sx. 6/10 she feels better today.  Less lethargic and confused for me today.  Status post tunneled dialysis catheter right IJ by vascular surgery on 6/9 first HD was started yesterday.Febrile yesterday afternoon 6/11 too sleepy this am to participate with exam. When I asked if she plans to eat her breakfast, responded when I get up. 6/12 more awake today. C/o LT thigh pain. No sob. Also c/o right leg  pain. Febrile again   Consultants:  Surgery, vascular surgery, nephrology  Procedures:   Antimicrobials:  Keflex   Subjective: No cp  Objective: Vitals:   01/11/22 2017 01/12/22 0330 01/12/22 0417 01/12/22 0700  BP:   (!) 153/61 (!) 145/64  Pulse:   98 81  Resp: 18  18 20   Temp:   98.2 F (36.8 C) (!) 102.4 F (39.1 C)  TempSrc:   Oral Oral  SpO2:   95% 98%  Weight:  102.4 kg    Height:        Intake/Output Summary (Last 24 hours) at 01/12/2022 0825 Last data filed at 01/12/2022 0559 Gross per 24 hour  Intake 200 ml  Output 690 ml  Net -490 ml   Filed Weights   01/10/22 1612 01/11/22 0500 01/12/22 0330  Weight: 98 kg 98 kg 102.4 kg    Examination: Calm, NAD Cta anteriorly , no wheezing Reg s1/s2 no gallop Soft benign +bs B/l LE edema up to thigh. Areas of Lt thigh with healed wound tender to touch, warm.  Awake and alert Mood and affect appropriate in current setting     Data Reviewed: I have personally reviewed following labs and imaging studies  CBC: Recent Labs  Lab 01/06/22 0532 01/07/22 0629 01/08/22 0941 01/09/22 0959 01/11/22 1752  WBC 12.2* 10.4 11.6* 11.1* 11.3*  HGB 8.3* 9.1* 8.4* 8.1* 7.9*  HCT 25.7* 28.5* 26.1* 25.7* 25.2*  MCV 92.8 93.4 93.9 93.1 93.7  PLT 395 435* 445* 396 220   Basic Metabolic Panel: Recent Labs  Lab 01/06/22 0532 01/07/22  8185 01/08/22 0941 01/09/22 0959 01/11/22 0625  NA 134* 132* 134* 137 135  K 4.7 4.9 4.9 4.7 3.6  CL 99 96* 95* 100 96*  CO2 23 24 27 26 28   GLUCOSE 214* 215* 145* 87 99  BUN 121* 120* 128* 123* 60*  CREATININE 4.18* 4.17* 4.36* 4.73* 3.01*  CALCIUM 10.3 10.2 10.1 9.2 9.3  MG  --  1.8  --   --   --   PHOS  --   --  4.1 4.0  --    GFR: Estimated Creatinine Clearance: 21.6 mL/min (A) (by C-G formula based on SCr of 3.01 mg/dL (H)). Liver Function Tests: Recent Labs  Lab 01/06/22 0532 01/08/22 0941 01/09/22 0959  AST 128*  --   --   ALT 75*  --   --   ALKPHOS 257*  --   --    BILITOT 0.6  --   --   PROT 7.4  --   --   ALBUMIN 3.3* 2.9* 2.5*   No results for input(s): "LIPASE", "AMYLASE" in the last 168 hours. No results for input(s): "AMMONIA" in the last 168 hours. Coagulation Profile: No results for input(s): "INR", "PROTIME" in the last 168 hours.  Cardiac Enzymes: No results for input(s): "CKTOTAL", "CKMB", "CKMBINDEX", "TROPONINI" in the last 168 hours. BNP (last 3 results) No results for input(s): "PROBNP" in the last 8760 hours. HbA1C: No results for input(s): "HGBA1C" in the last 72 hours. CBG: Recent Labs  Lab 01/11/22 0938 01/11/22 1152 01/11/22 1315 01/11/22 1744 01/11/22 2019  GLUCAP 88 87 83 104* 164*   Lipid Profile: No results for input(s): "CHOL", "HDL", "LDLCALC", "TRIG", "CHOLHDL", "LDLDIRECT" in the last 72 hours. Thyroid Function Tests: No results for input(s): "TSH", "T4TOTAL", "FREET4", "T3FREE", "THYROIDAB" in the last 72 hours. Anemia Panel: No results for input(s): "VITAMINB12", "FOLATE", "FERRITIN", "TIBC", "IRON", "RETICCTPCT" in the last 72 hours. Sepsis Labs: Recent Labs  Lab 01/11/22 1310  PROCALCITON 1.99     Recent Results (from the past 240 hour(s))  Culture, blood (Routine X 2) w Reflex to ID Panel     Status: None (Preliminary result)   Collection Time: 01/10/22  8:48 AM   Specimen: BLOOD  Result Value Ref Range Status   Specimen Description BLOOD BLOOD RIGHT HAND  Final   Special Requests   Final    BOTTLES DRAWN AEROBIC AND ANAEROBIC Blood Culture results may not be optimal due to an inadequate volume of blood received in culture bottles   Culture   Final    NO GROWTH 2 DAYS Performed at [facility], [address], [place], [place] [address]    Report Status PENDING  Incomplete  Culture, blood (Routine X 2) w Reflex to ID Panel     Status: None (Preliminary result)   Collection Time: 01/10/22  9:01 AM   Specimen: BLOOD  Result Value Ref Range Status   Specimen Description BLOOD LEFT  ANTECUBITAL  Final   Special Requests   Final    BOTTLES DRAWN AEROBIC AND ANAEROBIC Blood Culture adequate volume   Culture   Final    NO GROWTH 2 DAYS Performed at [facility], [address], [place], [place] [address]    Report Status PENDING  Incomplete  Culture, blood (Routine X 2) w Reflex to ID Panel     Status: None (Preliminary result)   Collection Time: 01/11/22  5:52 PM   Specimen: BLOOD  Result Value Ref Range Status   Specimen  Description BLOOD BR[facility]  Final   Special Requests BOTTLES DRAWN AEROBIC AND ANAEROBIC [place]  Final   Culture   Final    NO GROWTH < 12 HOURS Performed at [facility], [address] [place], [place] [address]    Report Status PENDING  Incomplete  Culture, blood (Routine X 2) w Reflex to ID Panel     Status: None (Preliminary result)   Collection Time: 01/11/22  6:00 PM   Specimen: BLOOD  Result Value Ref Range Status   Specimen Description BLOOD LAC  Final   Special Requests BOTTLES DRAWN AEROBIC AND ANAEROBIC BCAV  Final   Culture   Final    NO GROWTH < 12 HOURS Performed at [facility], [address], [place], [place] [address]    Report Status PENDING  Incomplete         Radiology Studies: DG Chest Port 1 View  Result Date: 01/11/2022 CLINICAL DATA:  Fever EXAM: PORTABLE CHEST 1 VIEW COMPARISON:  Previous studies including the examination of 01/10/2022 FINDINGS: Transverse diameter of heart is increased. Central pulmonary vessels are more prominent. This may be partly due to poor inspiration in the current study. There is no focal pulmonary consolidation. There is no pleural effusion or pneumothorax. Tip of dialysis catheter is seen in the region of right atrium. IMPRESSION: Central pulmonary vessels are more prominent which may be due to poor inspiration or suggest CHF. No new focal pulmonary infiltrates are seen. There is no pleural effusion. Electronically Signed   By: Elmer Picker M.D.   On:  01/11/2022 17:59   DG Chest Port 1 View  Result Date: 01/10/2022 CLINICAL DATA:  New onset fever and shortness of breath. EXAM: PORTABLE CHEST 1 VIEW COMPARISON:  12/31/2021 FINDINGS: 0833 hours. Low volume film. Cardiopericardial silhouette is at upper limits of normal for size. There is pulmonary vascular congestion without overt pulmonary edema. Minimal atelectasis or scarring noted left base. No overt edema or focal airspace consolidation. Right IJ dialysis catheter tip overlies the right atrium. Telemetry leads overlie the chest. IMPRESSION: Pulmonary vascular congestion without overt pulmonary edema. Electronically Signed   By: Misty Stanley M.D.   On: 01/10/2022 10:31        Scheduled Meds:  (feeding supplement) PROSource Plus  30 mL Oral BID BM   vitamin C  500 mg Oral BID   atorvastatin  40 mg Oral Daily   Chlorhexidine Gluconate Cloth  6 each Topical Q0600   fluticasone  2 spray Each Nare Daily   furosemide  40 mg Oral Daily   gabapentin  100 mg Oral TID   insulin aspart  0-5 Units Subcutaneous QHS   insulin aspart  0-9 Units Subcutaneous TID WC   loratadine  10 mg Oral Daily   metoprolol succinate  50 mg Oral BID   multivitamin  1 tablet Oral QHS   nutrition supplement (JUVEN)  1 packet Oral BID BM   zinc sulfate  220 mg Oral Daily   Continuous Infusions:  ampicillin-sulbactam (UNASYN) IV 3 g (01/12/22 0133)     Assessment & Plan:   Principal Problem:   Severe sepsis ([place]) Active Problems:   Chronic kidney disease, stage 5 ([place])   Essential hypertension   DNR (do not resuscitate)/DNI(Do Not Intubate)   Type 2 diabetes mellitus with chronic kidney disease, with long-term current use of insulin ([place])   Sacral wound, initial encounter   Swelling of left lower extremity   Abdominal pain   AKI (  acute kidney injury) ([place])   Hypomagnesemia   Symptomatic anemia   Hyponatremia   Premature atrial complexes   OSA (obstructive sleep apnea)   Pressure injury of skin    Elevated LFTs   Anasarca associated with disorder of kidney   Fever    * Severe sepsis (HCC) POA as evidenced by tachycardia, leukocytosis, AKI and transaminitis consistent with organ dysfunction and severe sepsis with suspected infection source being sacral wounds versus an abdominal source (CT abdomen pelvis negative for anything acute however).  MRSA PCR negative. Wound cultures grew few Staph aureus, rare Enterococcus faecalis. Initially treated with vancomycin/cefepime. Vanc stopped 6/4. Plan is to treat MSSA in both cultures.  Enterococcus likely contaminated from stool d/T location of wounds and MRSE likely colonization of skin  6/11 continue with keflex 6/11 on keflex. But still having fevers which started on 6/9, will broaden abx.    Fever On 6/9 evening 6/12 febrile again. Bcx tdn, cxr no pna. Legs feels swollen and warm /tender in areas of healed wound, asked gsx reassess make sure no abscess.  Will broaden abx to meropenem.  Will consult ID at this time      Sacral Decubitus wound Continue abx as above Gsx following, felt wounds stable and signed off.  S/p debridement of three decubitus wounds of b/l buttocks and wound vac placed No further debridment at this time. Healing challenging given her immobility , poor functional status, and poor nutritional status. Local wound care; pressure offload, freq. Repositioning, low air loss mattress.  Pain control prn 6/11 continue wound vac : change MWF; WOC RN on board to assist with changes 6/12 gsx to reassess wounds   Type 2 diabetes mellitus with chronic kidney disease, with long-term current use of insulin (HCC) BG on lower side today.  We will decrease Levemir to 15 units twice daily and DC the NovoLog with meals Encourage p.o. intake 6/12 not much appetite, encouraged po intake Monitor BG closely    Essential hypertension Losartan held due to AKI. On p.o. Lasix started by nephrology  IV hydralazine as needed   6/10 stable Continue to monitor     AKI on Chronic kidney disease, stage 5 (HCC) Hyponatremia Treated IV Lasix and albumin per nephrology.  6/7 bilateral lower extremity improving AKI secondary to underlying illness  CKD secondary to diabetes and hypertension  No exposure to IV contrast  CT abdomen pelvis negative for obstruction, several renal stones identified.   Losartan held due to AKI  Now switched to Lasix p.o. 40 mg twice daily  6/8 albumin was  stopped 6/9 exhibiting uremic symptoms today.  6/10 status post permacath placement on 6/9 by Dr. Delana Meyer first HD on 6/9 . Uremic sx appears to be improving. MS improved 6/12 HD today        Abdominal pain Patient reported on admission.  CT of abdomen pelvis showed abnormal endometrial thickening for a postmenopausal patient, bladder distention estimated 520 mL, no other acute or inflammatory processes or acute findings. --Monitor clinically for now -- Needs OB/GYN follow-up for endometrial sampling to exclude endometrial carcinoma 6/10 no further complaints    Swelling of left lower extremity With redness, patient attributes it to clindamycin.  She reported on admission that this has been present for over one month.   Left lower extremity Doppler US on 12/18/21 was negative for DVT.  6/12 will recheck venous US b/l as LE edema around thigh appears worse.      Symptomatic anemia Hemoglobin on admission  was 6.9 >> 6.3, down from 7.6 at time of discharge on 5/23.  No signs of active bleeding, did have vaginal/endometrial bleeding previous admission but no reports of this recently.  May have slow chronic blood loss from sacral wounds.  Has baseline anemia of chronic disease due to renal failure. Received 1 unit PRBC transfusion on admission. -- Transfusing another unit RBCs today -- Trend hemoglobin and transfuse if less than 7   Hypomagnesemia Resolved with replacement on 6/3. Monitor and replace Mg as needed.      Premature atrial complexes Patient became significantly tachycardic on 6/2 with heart rates sustaining in the 130s.  Cardiology reviewed telemetry and feels patient dysrhythmias were not A-fib but sinus tach with PACs. --Cardiology following On beta blk     Elevated LFTs On admission, alk phos 212, AST 182, ALT 107.  Unclear etiology, repeat LFTs not available today.  Appears LFTs were elevated at time of discharge on 5/23 but have increased since. RUQ ultrasound unremarkable. Acute hepatitis panel negative.  Pressure injury of skin Present on admission.  Patient has multiple sacral pressure injuries.  Some concern at time of admission that these were the source of sepsis. General surgery consulted and took patient to the OR on 6/1 for debridement.   Now with wound VAC. --Initial wound VAC change Monday 6/5 then every M/W/F -- Wound care consulted -- Frequent repositioning --Follow general surgery recommendations --Pain control --Follow blood cultures Pressure Injury 01/01/22 Buttocks Left Stage 3 -  Full thickness tissue loss. Subcutaneous fat may be visible but bone, tendon or muscle are NOT exposed. (Active)  01/01/22 0222  Location: Buttocks  Location Orientation: Left  Staging: Stage 3 -  Full thickness tissue loss. Subcutaneous fat may be visible but bone, tendon or muscle are NOT exposed.  Wound Description (Comments):   Present on Admission: Yes     Pressure Injury 01/01/22 Buttocks Right Stage 2 -  Partial thickness loss of dermis presenting as a shallow open injury with a red, pink wound bed without slough. (Active)  01/01/22 0223  Location: Buttocks  Location Orientation: Right  Staging: Stage 2 -  Partial thickness loss of dermis presenting as a shallow open injury with a red, pink wound bed without slough.  Wound Description (Comments):   Present on Admission: Yes     Pressure Injury 01/01/22 Hip Left Stage 2 -  Partial thickness loss of dermis presenting as a  shallow open injury with a red, pink wound bed without slough. (Active)  01/01/22 0225  Location: Hip  Location Orientation: Left  Staging: Stage 2 -  Partial thickness loss of dermis presenting as a shallow open injury with a red, pink wound bed without slough.  Wound Description (Comments):   Present on Admission: Yes     Pressure Injury 01/01/22 Hip Right Stage 3 -  Full thickness tissue loss. Subcutaneous fat may be visible but bone, tendon or muscle are NOT exposed. (Active)  01/01/22 0225  Location: Hip  Location Orientation: Right  Staging: Stage 3 -  Full thickness tissue loss. Subcutaneous fat may be visible but bone, tendon or muscle are NOT exposed.  Wound Description (Comments):   Present on Admission: Yes          OSA (obstructive sleep apnea) .  Appears not to be on CPAP       DVT prophylaxis: SCD Code Status: DNR Family Communication: None at bedside Disposition Plan:  Status is: Inpatient Remains inpatient appropriate because: renal function worsening,  on HD now, needs HD as outpatient, needs SNF. febrile      LOS: 11 days   Time spent 35 minutes with more than 50% on [place], MD [facility] Pager [phone]  If 7PM-7AM, please contact night-coverage 01/12/2022, 8:25 AM

## 2022-01-12 NOTE — Progress Notes (Signed)
PT Cancellation Note  Patient Details Name: Theresa Warren MRN: [id number] DOB: 1956-04-09   Cancelled Treatment:    Reason Eval/Treat Not Completed: Patient at procedure or test/unavailable. Pt off the floor for hemodialysis at this time. [place], NP, asked for therapy to create goal of increasing sitting tolerance to 4 hours for future outpatient HD treatments. Spoke to RN, Doroteo Bradford to create plan regarding pain meds and probable Harrel Lemon transfer to recliner later this afternoon. Will re-attempt at later time this date.    Patrina Levering PT, DPT

## 2022-01-12 NOTE — Progress Notes (Signed)
[facility]  PROGRESS NOTE   Subjective:   Patient seen and evaluated during dialysis   HEMODIALYSIS FLOWSHEET:  Blood Flow Rate (mL/min): 300 mL/min Arterial Pressure (mmHg): -110 mmHg Venous Pressure (mmHg): 90 mmHg Transmembrane Pressure (mmHg): 60 mmHg Ultrafiltration Rate (mL/min): 660 mL/min Dialysate Flow Rate (mL/min): 500 ml/min Conductivity: 14 Conductivity: 14 Dialysis Fluid Bolus: Normal Saline Bolus Amount (mL): 250 mL   Mild confusion remains  Tolerating meals  Objective:  Vital signs: Blood pressure (!) 164/78, pulse 82, temperature 99.7 F (37.6 C), temperature source Oral, resp. rate (!) 21, height 5\' 3"  (1.6 m), weight 101.8 kg, SpO2 100 %.  Intake/Output Summary (Last 24 hours) at 01/12/2022 1403 Last data filed at 01/12/2022 0800 Gross per 24 hour  Intake 200 ml  Output 690 ml  Net -490 ml    Filed Weights   01/11/22 0500 01/12/22 0330 01/12/22 1155  Weight: 98 kg 102.4 kg 101.8 kg     Physical Exam: General:  No acute distress  Head:  Normocephalic, atraumatic. Moist oral mucosal membranes  Eyes:  Anicteric  Lungs:   Clear to auscultation, normal effort  Heart:  S1S2 no rubs  Abdomen:   Soft, nontender, bowel sounds present  Extremities:  peripheral edema.  Neurologic:  Awake, alert, following commands  Skin:  No lesions  Access: RT Permcath    Basic Metabolic Panel: Recent Labs  Lab 01/07/22 0629 01/08/22 0941 01/09/22 0959 01/11/22 0625 01/12/22 1053  NA 132* 134* 137 135 134*  K 4.9 4.9 4.7 3.6 3.8  CL 96* 95* 100 96* 95*  CO2 24 27 26 28 26   GLUCOSE 215* 145* 87 99 124*  BUN 120* 128* 123* 60* 70*  CREATININE 4.17* 4.36* 4.73* 3.01* 5.02*  CALCIUM 10.2 10.1 9.2 9.3 8.8*  MG 1.8  --   --   --   --   PHOS  --  4.1 4.0  --  5.0*     CBC: Recent Labs  Lab 01/07/22 0629 01/08/22 0941 01/09/22 0959 01/11/22 1752 01/12/22 1054  WBC 10.4 11.6* 11.1* 11.3* 13.6*  HGB 9.1* 8.4* 8.1* 7.9* 7.4*  HCT 28.5*  26.1* 25.7* 25.2* 23.2*  MCV 93.4 93.9 93.1 93.7 92.4  PLT 435* 445* 396 295 255      Urinalysis: No results for input(s): "COLORURINE", "LABSPEC", "PHURINE", "GLUCOSEU", "HGBUR", "BILIRUBINUR", "KETONESUR", "PROTEINUR", "UROBILINOGEN", "NITRITE", "LEUKOCYTESUR" in the last 72 hours.  Invalid input(s): "APPERANCEUR"    Imaging: DG Chest Port 1 View  Result Date: 01/11/2022 CLINICAL DATA:  Fever EXAM: PORTABLE CHEST 1 VIEW COMPARISON:  Previous studies including the examination of 01/10/2022 FINDINGS: Transverse diameter of heart is increased. Central pulmonary vessels are more prominent. This may be partly due to poor inspiration in the current study. There is no focal pulmonary consolidation. There is no pleural effusion or pneumothorax. Tip of dialysis catheter is seen in the region of right atrium. IMPRESSION: Central pulmonary vessels are more prominent which may be due to poor inspiration or suggest CHF. No new focal pulmonary infiltrates are seen. There is no pleural effusion. Electronically Signed   By: Elmer Picker M.D.   On: 01/11/2022 17:59     Medications:    meropenem (MERREM) IV      (feeding supplement) PROSource Plus  30 mL Oral BID BM   vitamin C  500 mg Oral BID   atorvastatin  40 mg Oral Daily   Chlorhexidine Gluconate Cloth  6 each Topical Q0600   fluticasone  2  spray Each Nare Daily   furosemide  40 mg Oral Daily   gabapentin  100 mg Oral TID   heparin injection (subcutaneous)  5,000 Units Subcutaneous Q8H   insulin aspart  0-5 Units Subcutaneous QHS   insulin aspart  0-9 Units Subcutaneous TID WC   leptospermum manuka honey  1 application  Topical Daily   loratadine  10 mg Oral Daily   metoprolol succinate  50 mg Oral BID   multivitamin  1 tablet Oral QHS   nutrition supplement (JUVEN)  1 packet Oral BID BM   zinc sulfate  220 mg Oral Daily    Assessment/ Plan:     Principal Problem:   Severe sepsis (HCC) Active Problems:   OSA (obstructive  sleep apnea)   Chronic kidney disease, stage 5 ([place])   Essential hypertension   DNR (do not resuscitate)/DNI(Do Not Intubate)   Type 2 diabetes mellitus with chronic kidney disease, with long-term current use of insulin (HCC)   Hypomagnesemia   Pressure injury of skin   Symptomatic anemia   Elevated LFTs   Sacral wound, initial encounter   Swelling of left lower extremity   Abdominal pain   AKI (acute kidney injury) ([place])   Hyponatremia   Premature atrial complexes   Anasarca associated with disorder of kidney   Fever  66 year old female with history of hypertension, diabetes, peripheral vascular disease, hyperlipidemia, now with sepsis and acute kidney injury on the top of chronic kidney disease.  Patient is reached end-stage renal disease and was started on renal replacement therapy.   #1: End-stage renal disease: Patient has a permacath placed. Receiving dialysis today, UF 1-1.5L as tolerated. Next treatment scheduled for Wednesday.   Patient encouraged to participate with PT/OT due to needing to sit in recliner for outpatient treatment. PT/OT and nursing staff notified.    #2: Sepsis/fever: Patient has sacral decub ulcers.  Now off of antibiotics.  Wound vac remains in place   #3: Anemia secondary to chronic kidney disease complicated by sepsis. Has received blood transfusions during this admission. We will continue anemia protocol.  Will monitor closely.   #4: Secondary hyperparathyroidism: .Calcium and phosphorus within acceptable range.    #5: Metabolic acidosis: CO2 improved.  Will discontinue sodium bicarbonate   #6: Congestive heart failure: We will decrease the dose of furosemide to 40 mg daily.    LOS: [facility] 6/12/20232:03 PM

## 2022-01-12 NOTE — Progress Notes (Signed)
01/10/22 1341 01/10/22 1345 01/10/22 1400  Vitals  Temp 100.2 F (37.9 C) (RN NOTIFIED OF PT TEMP)  --   --   Temp Source Oral  --   --   BP (!) 132/56 (!) 134/56 (!) 117/50  MAP (mmHg) 79 79 (!) 60  BP Location Right Arm  --   --   BP Method Automatic  --   --   Patient Position (if appropriate) Lying  --   --   Pulse Rate 84 81 83  Pulse Rate Source Monitor  --   --   ECG Heart Rate 85 81 83  Resp (!) 23 (!) 31 (!) 33  During Treatment Monitoring  Blood Flow Rate (mL/min) 250 mL/min 250 mL/min 250 mL/min  Arterial Pressure (mmHg) -80 mmHg -90 mmHg -90 mmHg  Venous Pressure (mmHg) 40 mmHg 80 mmHg 80 mmHg  Transmembrane Pressure (mmHg) 40 mmHg 30 mmHg 30 mmHg  Ultrafiltration Rate (mL/min) 200 mL/min 200 mL/min 200 mL/min  Dialysate Flow Rate (mL/min) 300 ml/min 300 ml/min 300 ml/min  Conductivity 13.7 13.7 13.7  HD Safety Checks Performed Yes  --   --   Dialysis Fluid Bolus Normal Saline  --   --   Bolus Amount (mL) 250 mL  --   --   Intra-Hemodialysis Comments Tx initiated (by PCT faith) Progressing as prescribed Progressing as prescribed    01/10/22 1415 01/10/22 1430 01/10/22 1445  Vitals  Temp  --   --   --   Temp Source  --   --   --   BP 137/60 (!) 135/54 (!) 148/50  MAP (mmHg) 83 75 74  BP Location  --   --   --   BP Method  --   --   --   Patient Position (if appropriate)  --   --   --   Pulse Rate 81 81 (!) 46  Pulse Rate Source  --   --   --   ECG Heart Rate 82 82 89  Resp 18 (!) 28 (!) 21  During Treatment Monitoring  Blood Flow Rate (mL/min) 250 mL/min 250 mL/min 250 mL/min  Arterial Pressure (mmHg) -90 mmHg -90 mmHg -90 mmHg  Venous Pressure (mmHg) 80 mmHg 80 mmHg 80 mmHg  Transmembrane Pressure (mmHg) 30 mmHg 30 mmHg 30 mmHg  Ultrafiltration Rate (mL/min) 200 mL/min 200 mL/min 200 mL/min  Dialysate Flow Rate (mL/min) 300 ml/min 300 ml/min 300 ml/min  Conductivity 13.7 13.7 13.7  HD Safety Checks Performed  --   --   --   Dialysis Fluid Bolus  --    --   --   Bolus Amount (mL)  --   --   --   Intra-Hemodialysis Comments Progressing as prescribed Progressing as prescribed Progressing as prescribed    01/10/22 1500 01/10/22 1515 01/10/22 1530  Vitals  Temp  --   --   --   Temp Source  --   --   --   BP (!) 115/57 (!) 129/52 (!) 146/69  MAP (mmHg) 73 73 92  BP Location  --   --   --   BP Method  --   --   --   Patient Position (if appropriate)  --   --   --   Pulse Rate 82 80 80  Pulse Rate Source  --   --   --   ECG Heart Rate 86 96 86  Resp 19 20 16   During Treatment  Monitoring  Blood Flow Rate (mL/min) 250 mL/min 250 mL/min 250 mL/min  Arterial Pressure (mmHg) -90 mmHg -90 mmHg -90 mmHg  Venous Pressure (mmHg) 80 mmHg 80 mmHg 80 mmHg  Transmembrane Pressure (mmHg) 30 mmHg 30 mmHg 30 mmHg  Ultrafiltration Rate (mL/min) 200 mL/min 200 mL/min 200 mL/min  Dialysate Flow Rate (mL/min) 300 ml/min 300 ml/min 300 ml/min  Conductivity 13.7 13.7 13.7  HD Safety Checks Performed  --   --   --   Dialysis Fluid Bolus  --   --   --   Bolus Amount (mL)  --   --   --   Intra-Hemodialysis Comments Progressing as prescribed Progressing as prescribed Progressing as prescribed    01/10/22 1545 01/10/22 1600  Vitals  Temp  --   --   Temp Source  --   --   BP (!) 122/56 (!) 132/45  MAP (mmHg) 77 69  BP Location  --   --   BP Method  --   --   Patient Position (if appropriate)  --   --   Pulse Rate 67 84  Pulse Rate Source  --   --   ECG Heart Rate 85 85  Resp (!) 23 16  During Treatment Monitoring  Blood Flow Rate (mL/min) 250 mL/min 250 mL/min  Arterial Pressure (mmHg) -90 mmHg -90 mmHg  Venous Pressure (mmHg) 80 mmHg 80 mmHg  Transmembrane Pressure (mmHg) 30 mmHg 30 mmHg  Ultrafiltration Rate (mL/min) 200 mL/min 200 mL/min  Dialysate Flow Rate (mL/min) 300 ml/min 300 ml/min  Conductivity 13.7 13.7  HD Safety Checks Performed  --   --   Dialysis Fluid Bolus  --   --   Bolus Amount (mL)  --   --   Intra-Hemodialysis Comments  Progressing as prescribed Progressing as prescribed

## 2022-01-12 NOTE — Consult Note (Signed)
NAME: Theresa Warren  DOB: [birth year]  MRN: [id number]  Date/Time: 01/12/2022 11:57 AM  REQUESTING PROVIDER: Dr. Kurtis Bushman Subjective:  REASON FOR CONSULT: fever ? Theresa Warren is a 66 y.o. female with a history of CKD, DM, HTN, chronic pain syndrome, recent hospitalization for fall between 12/18/21-12/23/21 and found to be severely anemic due to vaginal bleed and received PRBC presented from [facility]  on 12/31/21 because of Na of 109 .Vitals in the ED were BP 142/66, HR 107, TEMP 99.6 Cr 4.61, WBC 18.4, HB 6.9 and PKT 473, Na 129 After blood cultrue was sent was started on broad spectrum Iv antibiotics Noted to have /l buttock wounds Was taken to the OR by Dr. Hampton Abbot nd underwent debridement of the wounds and wound vac placed Culture sent was MSSa and enterococcus- initially was on IV vanco and cefepime Also seen by nephrologist for worsening kidney function and edema, hypoalbuminemia and metabolic acidosis Pt had HD cath placed RT IJ by vascular on 01/09/22 and started dialysis   Pt has had intermittent low grade fever , which is now high. I am asked to see the patient for the same Pt c/o painful lesions in her leg She thinks it is due to clindamycin she took a month ago for a tooth extraction  Past Medical History:  Diagnosis Date   CKD (chronic kidney disease), stage III ([place])    Diabetes ([place])    Hyperlipidemia    Hypertension    Obesity     Past Surgical History:  Procedure Laterality Date   ACHILLES TENDON REPAIR     ANKLE SURGERY     DIALYSIS/PERMA CATHETER INSERTION N/A 01/09/2022   Procedure: DIALYSIS/PERMA CATHETER INSERTION;  Surgeon: Katha Cabal, MD;  Location: [facility];  Service: Cardiovascular;  Laterality: N/A;   INCISION AND DRAINAGE ABSCESS Bilateral 01/01/2022   Procedure: INCISION AND DRAINAGE ABSCESS-Sacral Decubitus;  Surgeon: Olean Ree, MD;  Location: [facility];  Service: General;  Laterality: Bilateral;   URETHRAL  STRICTURE DILATATION      Social History   Socioeconomic History   Marital status: Divorced    Spouse name: Not on file   Number of children: Not on file   Years of education: Not on file   Highest education level: Not on file  Occupational History   Occupation: Works [employer]  Tobacco Use   Smoking status: Never   Smokeless tobacco: Not on file  Substance and Sexual Activity   Alcohol use: Not Currently   Drug use: Not Currently   Sexual activity: Not Currently  Other Topics Concern   Not on file  Social History Narrative   Lives alone   Social Determinants of Health   Financial Resource Strain: Not on file  Food Insecurity: Not on file  Transportation Needs: Not on file  Physical Activity: Not on file  Stress: Not on file  Social Connections: Not on file  Intimate Partner Violence: Not on file    Family History  Problem Relation Age of Onset   Alzheimer's disease Father    Diabetes Father    CAD Father 62   CAD Other        Multliple maternal aunts and uncles with early onset heart disease   Lung cancer Sister    ALS Mother    Allergies  Allergen Reactions   Codeine Nausea And Vomiting   Sulfa Antibiotics Swelling   Clindamycin/Lincomycin Rash   Penicillins Rash   I? Current Facility-Administered Medications  Medication Dose Route Frequency Provider Last Rate Last Admin   (feeding supplement) PROSource Plus liquid 30 mL  30 mL Oral BID BM Schnier, Dolores Lory, MD   30 mL at 01/12/22 0843   acetaminophen (TYLENOL) tablet 650 mg  650 mg Oral Q6H PRN Sharion Settler, NP   650 mg at 01/12/22 0842   alteplase (CATHFLO ACTIVASE) injection 2 mg  2 mg Intracatheter Once PRN Schnier, Dolores Lory, MD       ascorbic acid (VITAMIN C) tablet 500 mg  500 mg Oral BID Schnier, Dolores Lory, MD   500 mg at 01/12/22 0841   atorvastatin (LIPITOR) tablet 40 mg  40 mg Oral Daily Schnier, Dolores Lory, MD   40 mg at 01/12/22 0841   Chlorhexidine Gluconate Cloth 2 % PADS 6 each  6  each Topical Q0600 Schnier, Dolores Lory, MD   6 each at 01/12/22 0406   fluticasone (FLONASE) 50 MCG/ACT nasal spray 2 spray  2 spray Each Nare Daily Schnier, Dolores Lory, MD   2 spray at 01/12/22 0842   furosemide (LASIX) tablet 40 mg  40 mg Oral Daily Lyla Son, MD   40 mg at 01/12/22 0841   gabapentin (NEURONTIN) capsule 100 mg  100 mg Oral TID Katha Cabal, MD   100 mg at 01/11/22 1713   heparin injection 1,000 Units  1,000 Units Dialysis PRN Katha Cabal, MD   4,200 Units at 01/10/22 1628   heparin injection 5,000 Units  5,000 Units Subcutaneous Q8H Darrick Penna, RPH       hydrALAZINE (APRESOLINE) tablet 25 mg  25 mg Oral Q6H PRN Schnier, Dolores Lory, MD       HYDROmorphone (DILAUDID) injection 1 mg  1 mg Intravenous Once PRN Schnier, Dolores Lory, MD       insulin aspart (novoLOG) injection 0-5 Units  0-5 Units Subcutaneous QHS Schnier, Dolores Lory, MD   3 Units at 01/06/22 2136   insulin aspart (novoLOG) injection 0-9 Units  0-9 Units Subcutaneous TID WC Schnier, Dolores Lory, MD   2 Units at 01/10/22 1228   leptospermum manuka honey (MEDIHONEY) paste 1 application   1 application  Topical Daily Amery, Gwynneth Albright, MD       lidocaine (PF) (XYLOCAINE) 1 % injection 5 mL  5 mL Intradermal PRN Schnier, Dolores Lory, MD       lidocaine-prilocaine (EMLA) cream 1 application   1 application  Topical PRN Schnier, Dolores Lory, MD       loratadine (CLARITIN) tablet 10 mg  10 mg Oral Daily Schnier, Dolores Lory, MD   10 mg at 01/12/22 0841   meropenem (MERREM) 500 mg in sodium chloride 0.9 % 100 mL IVPB  500 mg Intravenous Q24H Nolberto Hanlon, MD       metoprolol succinate (TOPROL-XL) 24 hr tablet 50 mg  50 mg Oral BID Schnier, Dolores Lory, MD   50 mg at 01/12/22 0842   morphine (PF) 2 MG/ML injection 2 mg  2 mg Intravenous Q4H PRN Schnier, Dolores Lory, MD   2 mg at 01/09/22 1717   multivitamin (RENA-VIT) tablet 1 tablet  1 tablet Oral QHS Schnier, Dolores Lory, MD   1 tablet at 01/11/22 2117   nutrition  supplement (JUVEN) (JUVEN) powder packet 1 packet  1 packet Oral BID BM Schnier, Dolores Lory, MD   1 packet at 01/12/22 0842   ondansetron (ZOFRAN) tablet 4 mg  4 mg Oral Q6H PRN Schnier, Dolores Lory, MD  Or   ondansetron (ZOFRAN) injection 4 mg  4 mg Intravenous Q6H PRN Schnier, Dolores Lory, MD   4 mg at 01/04/22 1556   ondansetron (ZOFRAN) injection 4 mg  4 mg Intravenous Q6H PRN Schnier, Dolores Lory, MD       oxyCODONE (Oxy IR/ROXICODONE) immediate release tablet 5-10 mg  5-10 mg Oral Q4H PRN Schnier, Dolores Lory, MD   5 mg at 01/12/22 5176   pentafluoroprop-tetrafluoroeth (GEBAUERS) aerosol 1 application   1 application  Topical PRN Schnier, Dolores Lory, MD       zinc sulfate capsule 220 mg  220 mg Oral Daily Schnier, Dolores Lory, MD   220 mg at 01/12/22 1607     Abtx:  Anti-infectives (From admission, onward)    Start     Dose/Rate Route Frequency Ordered Stop   01/12/22 1800  meropenem (MERREM) 500 mg in sodium chloride 0.9 % 100 mL IVPB        500 mg 200 mL/hr over 30 Minutes Intravenous Every 24 hours 01/12/22 1129     01/11/22 1400  meropenem (MERREM) 1 g in sodium chloride 0.9 % 100 mL IVPB  Status:  Discontinued       Note to Pharmacy: Pharmacy for dosing.   1 g 200 mL/hr over 30 Minutes Intravenous Every 8 hours 01/11/22 1301 01/11/22 1333   01/11/22 1400  Ampicillin-Sulbactam (UNASYN) 3 g in sodium chloride 0.9 % 100 mL IVPB  Status:  Discontinued        3 g 200 mL/hr over 30 Minutes Intravenous Every 12 hours 01/11/22 1333 01/12/22 1129   01/09/22 1203  vancomycin (VANCOREADY) IVPB 1500 mg/300 mL        1,500 mg 150 mL/hr over 120 Minutes Intravenous 120 min pre-op 01/09/22 1203 01/09/22 1630   01/05/22 1200  cephALEXin (KEFLEX) capsule 250 mg        250 mg Oral Every 8 hours 01/05/22 1045 01/11/22 0559   01/01/22 2200  ceFEPIme (MAXIPIME) 2 g in sodium chloride 0.9 % 100 mL IVPB  Status:  Discontinued        2 g 200 mL/hr over 30 Minutes Intravenous Every 24 hours 01/01/22 0315  01/04/22 0855   01/01/22 1000  vancomycin (VANCOREADY) IVPB 500 mg/100 mL  Status:  Discontinued        500 mg 100 mL/hr over 60 Minutes Intravenous Every 36 hours 01/01/22 0315 01/05/22 1045   12/31/21 2345  vancomycin (VANCOCIN) IVPB 1000 mg/200 mL premix       See Hyperspace for full Linked Orders Report.   1,000 mg 200 mL/hr over 60 Minutes Intravenous  Once 12/31/21 2342 01/01/22 0335   12/31/21 2345  vancomycin (VANCOCIN) IVPB 1000 mg/200 mL premix       See Hyperspace for full Linked Orders Report.   1,000 mg 200 mL/hr over 60 Minutes Intravenous  Once 12/31/21 2342 01/01/22 0335   12/31/21 2245  ceFEPIme (MAXIPIME) 2 g in sodium chloride 0.9 % 100 mL IVPB        2 g 200 mL/hr over 30 Minutes Intravenous  Once 12/31/21 2237 01/01/22 0103   12/31/21 2245  vancomycin (VANCOCIN) IVPB 1000 mg/200 mL premix  Status:  Discontinued        1,000 mg 200 mL/hr over 60 Minutes Intravenous  Once 12/31/21 2237 12/31/21 2341   12/31/21 2245  metroNIDAZOLE (FLAGYL) IVPB 500 mg        500 mg 100 mL/hr over 60 Minutes Intravenous  Once 12/31/21 2237  01/01/22 0646       REVIEW OF SYSTEMS:  Const:  fever, negative chills, negative weight loss Eyes: negative diplopia or visual changes, negative eye pain ENT: negative coryza, negative sore throat Resp: negative cough, hemoptysis, has dyspnea Cards: negative for chest pain, palpitations, has lower extremity edema GU: negative for frequency, dysuria and hematuria GI: Negative for abdominal pain, diarrhea, bleeding, constipation Skin: skin rash . No pruritus Heme:easy bruising  MS: generalized weakness and muscle weakness Neurolo:, dizziness, confusion Psych: negative for feelings of anxiety, depression  Endocrine:  diabetes Allergy/Immunology- PCN rash Clindamycin rash: Objective:  VITALS:  BP (!) 128/51 (BP Location: Left Arm)   Pulse 82   Temp (!) 100.4 F (38 C) (Oral)   Resp (!) 21   Ht 5\' 3"  (1.6 m)   Wt 102.4 kg   SpO2 99%    BMI 39.99 kg/m  LDA Rt HD cath PHYSICAL EXAM:  General: Awake, in some distress, oriented in place, person, year, month, responds appropriately to questions , but oes not remember well and unable to capture it completely Head: Normocephalic, without obvious abnormality, atraumatic. Eyes: Conjunctivae clear, anicteric sclerae. Pupils are equal ENT Nares normal. No drainage or sinus tenderness. Lips, mucosa, and tongue normal. No Thrush Neck: symmetrical, no adenopathy, thyroid: non tender no carotid bruit and no JVD. Back: buttock wounds Wound vac Picture reviewed Pre debridement   Post debridement   Lungs: Clear to auscultation bilaterally. No Wheezing or Rhonchi. No rales. Heart: Regular rate and rhythm, no murmur, rub or gallop. Abdomen: Soft, non-tender,not distended. Bowel sounds normal. No masses Extremities: multiple areas of indrated skin/soft tissue with central ischemic are- Upper thighs B/l, left calf Left upper thigh  Rt upper thigh  Left calf   Rt hip area   Skin: macular rash   Lymph: Cervical, supraclavicular normal. Neurologic: Grossly non-focal Pertinent Labs Lab Results CBC    Component Value Date/Time   WBC 13.6 (H) 01/12/2022 1054   RBC 2.51 (L) 01/12/2022 1054   HGB 7.4 (L) 01/12/2022 1054   HCT 23.2 (L) 01/12/2022 1054   PLT 255 01/12/2022 1054   MCV 92.4 01/12/2022 1054   MCH 29.5 01/12/2022 1054   MCHC 31.9 01/12/2022 1054   RDW 15.0 01/12/2022 1054   LYMPHSABS 1.4 01/01/2022 0752   MONOABS 1.1 (H) 01/01/2022 0752   EOSABS 0.1 01/01/2022 0752   BASOSABS 0.0 01/01/2022 0752       Latest Ref Rng & Units 01/11/2022    6:25 AM 01/09/2022    9:59 AM 01/08/2022    9:41 AM  CMP  Glucose 70 - 99 mg/dL 99  87  145   BUN 8 - 23 mg/dL 60  123  128   Creatinine 0.44 - 1.00 mg/dL 3.01  4.73  4.36   Sodium 135 - 145 mmol/L 135  137  134   Potassium 3.5 - 5.1 mmol/L 3.6  4.7  4.9   Chloride 98 - 111 mmol/L 96  100  95   CO2 22 - 32 mmol/L 28   26  27    Calcium 8.9 - 10.3 mg/dL 9.3  9.2  10.1       Microbiology: Recent Results (from the past 240 hour(s))  Culture, blood (Routine X 2) w Reflex to ID Panel     Status: None (Preliminary result)   Collection Time: 01/10/22  8:48 AM   Specimen: BLOOD  Result Value Ref Range Status   Specimen Description BLOOD BLOOD RIGHT HAND  Final  Special Requests   Final    BOTTLES DRAWN AEROBIC AND ANAEROBIC Blood Culture results may not be optimal due to an inadequate volume of blood received in culture bottles   Culture   Final    NO GROWTH 2 DAYS Performed at [facility], [address], [place], [place] [address]    Report Status PENDING  Incomplete  Culture, blood (Routine X 2) w Reflex to ID Panel     Status: None (Preliminary result)   Collection Time: 01/10/22  9:01 AM   Specimen: BLOOD  Result Value Ref Range Status   Specimen Description BLOOD LEFT ANTECUBITAL  Final   Special Requests   Final    BOTTLES DRAWN AEROBIC AND ANAEROBIC Blood Culture adequate volume   Culture   Final    NO GROWTH 2 DAYS Performed at [facility], [address], [place], [place] [address]    Report Status PENDING  Incomplete  Culture, blood (Routine X 2) w Reflex to ID Panel     Status: None (Preliminary result)   Collection Time: 01/11/22  5:52 PM   Specimen: BLOOD  Result Value Ref Range Status   Specimen Description BLOOD BRH  Final   Special Requests BOTTLES DRAWN AEROBIC AND ANAEROBIC BCHV  Final   Culture   Final    NO GROWTH < 12 HOURS Performed at [facility], [address], [place], [place] [address]    Report Status PENDING  Incomplete  Culture, blood (Routine X 2) w Reflex to ID Panel     Status: None (Preliminary result)   Collection Time: 01/11/22  6:00 PM   Specimen: BLOOD  Result Value Ref Range Status   Specimen Description BLOOD LAC  Final   Special Requests BOTTLES DRAWN AEROBIC AND ANAEROBIC BCAV  Final   Culture   Final    NO GROWTH  < 12 HOURS Performed at [facility], [address] [place], [place] [address]    Report Status PENDING  Incomplete    IMAGING RESULTS:  I have personally reviewed the films ?prominent central pulmonary vessels   Impression/Recommendation ?66 yr female with h/o CKD stage V started on HD this admission, has multiple lesions on her lower extremities With fever  B/l buttock wounds- thought to be decubitus on admission and was debrided 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 Due to drug allergy Pt is currently on meropenem  Skin rash- non specific rash - looks chronic- could it be antibiotic / Pcn ? autoimmune  Anemia  DM-on insulin ? ? ___________________________________________________ Discussed with patient, and her nurse Note:  This document was prepared using Dragon voice recognition software and may include unintentional dictation errors.

## 2022-01-12 NOTE — Progress Notes (Signed)
   01/09/22 1636 01/09/22 1644 01/09/22 1700  Vitals  BP  --  (!) 146/52 126/66  MAP (mmHg)  --  77 84  Pulse Rate  --  86 83  ECG Heart Rate  --  87 84  Resp  --  15 16  During Treatment Monitoring  Blood Flow Rate (mL/min) 200 mL/min 200 mL/min 200 mL/min  Arterial Pressure (mmHg) -70 mmHg -70 mmHg -70 mmHg  Venous Pressure (mmHg) 60 mmHg 60 mmHg 60 mmHg  Transmembrane Pressure (mmHg) 20 mmHg 20 mmHg 20 mmHg  Ultrafiltration Rate (mL/min) 240 mL/min 240 mL/min 240 mL/min  Dialysate Flow Rate (mL/min) 300 ml/min 300 ml/min 300 ml/min  Conductivity 13.4 13.4 13.4  HD Safety Checks Performed Yes Yes Yes  Dialysis Fluid Bolus Normal Saline Normal Saline  --   Bolus Amount (mL)  --  250 mL  --   Intra-Hemodialysis Comments Tx initiated Progressing as prescribed Progressing as prescribed    01/09/22 1715 01/09/22 1717 01/09/22 1730  Vitals  BP (!) 123/53 (!) 123/53 (!) 127/56  MAP (mmHg) 74 74 75  Pulse Rate 83 80 84  ECG Heart Rate 84 85 84  Resp 16 16 (!) 22  During Treatment Monitoring  Blood Flow Rate (mL/min) 200 mL/min 200 mL/min 200 mL/min  Arterial Pressure (mmHg) -70 mmHg -70 mmHg -70 mmHg  Venous Pressure (mmHg) 60 mmHg 60 mmHg 60 mmHg  Transmembrane Pressure (mmHg) 20 mmHg 20 mmHg 20 mmHg  Ultrafiltration Rate (mL/min) 240 mL/min 240 mL/min 240 mL/min  Dialysate Flow Rate (mL/min) 300 ml/min 300 ml/min 300 ml/min  Conductivity 13.4 13.4 13.4  HD Safety Checks Performed Yes Yes Yes  Dialysis Fluid Bolus  --   --   --   Bolus Amount (mL)  --   --   --   Intra-Hemodialysis Comments Progressing as prescribed Progressing as prescribed Progressing as prescribed

## 2022-01-12 NOTE — Progress Notes (Signed)
   01/09/22 1745 01/09/22 1800 01/09/22 1815  Vitals  BP (!) 116/56 (!) 139/59 (!) 111/52  MAP (mmHg) 75 84 72  Pulse Rate 84 87 88  ECG Heart Rate 86 87 89  Resp (!) 26 18 (!) 21  During Treatment Monitoring  Blood Flow Rate (mL/min) 200 mL/min 200 mL/min 200 mL/min  Arterial Pressure (mmHg) -70 mmHg -80 mmHg -80 mmHg  Venous Pressure (mmHg) 170 mmHg 200 mmHg 220 mmHg  Transmembrane Pressure (mmHg) 20 mmHg 20 mmHg 20 mmHg  Ultrafiltration Rate (mL/min) 240 mL/min 240 mL/min 240 mL/min  Dialysate Flow Rate (mL/min) 300 ml/min 300 ml/min 300 ml/min  Conductivity 13.4 13.4 13.4  HD Safety Checks Performed Yes Yes Yes  Intra-Hemodialysis Comments Progressing as prescribed Progressing as prescribed Progressing as prescribed    01/09/22 1830 01/09/22 1845 01/09/22 1849  Vitals  BP 131/61 (!) 148/69  --   MAP (mmHg) 81 86  --   Pulse Rate 81 85  --   ECG Heart Rate 83 88  --   Resp 19 (!) 23  --   During Treatment Monitoring  Blood Flow Rate (mL/min) 200 mL/min 200 mL/min  --   Arterial Pressure (mmHg) -80 mmHg -80 mmHg  --   Venous Pressure (mmHg) 220 mmHg 190 mmHg  --   Transmembrane Pressure (mmHg) 20 mmHg 20 mmHg  --   Ultrafiltration Rate (mL/min) 240 mL/min 240 mL/min  --   Dialysate Flow Rate (mL/min) 300 ml/min 300 ml/min  --   Conductivity 13.4 13.4  --   HD Safety Checks Performed Yes Yes  --   Intra-Hemodialysis Comments Progressing as prescribed Progressing as prescribed Tx completed

## 2022-01-12 NOTE — Consult Note (Addendum)
Toast Nurse wound follow up Wound type: Routine NPWT dressing change Measurement: refer to previous progress notes. Patient with three (3) Stage 4 pressure injuries: 1 to left buttock, 1 to left hip and 1 to sacrum. Moist yellow slough covering full thickness wounds surrounds debrided pressure injury wounds. This created a difficult situation to maintaining an NPWT dressing seal challenging. The gluteal cleft areas are red moist and macerated with moisture associated skin damage. ICD-10 CM Codes for Irritant Dermatitis L24A0 - Due to friction or contact with body fluids; unspecified  Wound beds: pale pink/yellow Scattered full thickness wounds surround debrided wounds. Drainage (amount, consistency, odor): mod amt serous to serosanguinous in cannister and tubing.  Dressing procedure/placement/frequency: Each wound had one piece of black foam removed from wound and wounds then are cleansed with NS. Reapplied one piece black foam to each wound and bridged together with another black foam and bridged trackpad to left upper hip to reduce pressure.  Barrier rings applied around wound edges to attempt to maintain a seal.  Dressing is attached to 175mmHg continuous negative pressure and an immediate seal is achieved. Pt medicated for pain prior to the procedure and had mod amt discomfort.    Left upper thigh and posterior thigh with previously noted "rugburn" present on admission from an unknown period of time found down; both areas have evolved. Left upper thigh is an Unstageable pressure injury; 3X3cm, 100% tightly adhered eschar Left posterior thigh with patchy dark red-purple "grid" of Deep tissue pressure injury, 6X6cm Topical treatment orders provided for bedside nurses to perform as follows: Apply Medihoney to left anterior thigh and left outer thigh wounds Q day, then cover with ABD pads and tape  WOC team will plan to change the Vac dressings again on Wed.  Gae Dry MSN, RN, [place],  [place], [place]    I

## 2022-01-12 NOTE — Progress Notes (Signed)
PT Cancellation Note  Patient Details Name: Theresa Warren MRN: [id number] DOB: 02-Jul-1956   Cancelled Treatment:    Reason Eval/Treat Not Completed: Pain limiting ability to participate;Patient at procedure or test/unavailable;Other (comment). Pt returns to room from HD after 4pm. RN states she is lethargic and in pain, both limiting pt ability to participate. Will hold at this time and attempt at later date.    Patrina Levering PT, DPT

## 2022-01-12 NOTE — Progress Notes (Signed)
Working on dialysis outpatient placement at [facility].

## 2022-01-13 DIAGNOSIS — R509 Fever, unspecified: Secondary | ICD-10-CM | POA: Diagnosis not present

## 2022-01-13 DIAGNOSIS — R6520 Severe sepsis without septic shock: Secondary | ICD-10-CM | POA: Diagnosis not present

## 2022-01-13 DIAGNOSIS — N186 End stage renal disease: Secondary | ICD-10-CM | POA: Diagnosis not present

## 2022-01-13 DIAGNOSIS — A419 Sepsis, unspecified organism: Secondary | ICD-10-CM | POA: Diagnosis not present

## 2022-01-13 LAB — HEMOGLOBIN AND HEMATOCRIT, BLOOD
HCT: 24 % — ABNORMAL LOW (ref 36.0–46.0)
Hemoglobin: 7.7 g/dL — ABNORMAL LOW (ref 12.0–15.0)

## 2022-01-13 LAB — GLUCOSE, CAPILLARY
Glucose-Capillary: 204 mg/dL — ABNORMAL HIGH (ref 70–99)
Glucose-Capillary: 273 mg/dL — ABNORMAL HIGH (ref 70–99)
Glucose-Capillary: 234 mg/dL — ABNORMAL HIGH (ref 70–99)
Glucose-Capillary: 265 mg/dL — ABNORMAL HIGH (ref 70–99)

## 2022-01-13 MED ORDER — ALBUMIN HUMAN 25 % IV SOLN: 25.0000 g | Freq: Once | INTRAVENOUS | Status: AC

## 2022-01-13 MED ORDER — INSULIN GLARGINE-YFGN 100 UNIT/ML ~~LOC~~ SOLN
5.0000 [IU] | Freq: Every day | SUBCUTANEOUS | Status: DC
  Administered 2022-01-13 – 2022-01-16 (×4): 5 [IU] via SUBCUTANEOUS
  Filled 2022-01-13 (×4): qty 0.05

## 2022-01-13 NOTE — Progress Notes (Signed)
Physical Therapy Treatment Patient Details Name: Theresa Warren MRN: [id number] DOB: May 04, 1956 Today's Date: 01/13/2022   History of Present Illness [name] is a 66 year old female admitted for evaluation of low sodium noted on labs at the facility.  Pt was discharged  to rehab on 5/23 after being admitted following a fall. PMH significant for acute on chronic anemia. hyperlipidemia, hypertension, CKD stage V, insulin-dependent diabetes mellitus    PT Comments    Patient alert agreeable to PT with some encouragement. Reported pain in L hip, wound vac area, mild-moderate pain signs with mobility attempts. The pt did demonstrate improved tolerance compared to previous session. She was able to transition to EOB with modAx2 pt able to initiate movement but assistance provided to limit shearing on buttocks. Fair sitting balance once repositioned in midline, which pt was able to help with. Sit <> stand with RW and minAx2, and able to to take a few side steps towards [facility] at least minA with PT assist to manage RW. Returned to supine modAx2, and left with all needs in reach. The patient would benefit from further skilled PT intervention to continue to progress towards goals. Recommendation remains appropriate.       Recommendations for follow up therapy are one component of a multi-disciplinary discharge planning process, led by the attending physician.  Recommendations may be updated based on patient status, additional functional criteria and insurance authorization.  Follow Up Recommendations  Skilled nursing-short term rehab (<3 hours/day)     Assistance Recommended at Discharge Frequent or constant Supervision/Assistance  Patient can return home with the following A lot of help with bathing/dressing/bathroom;Assistance with cooking/housework;Help with stairs or ramp for entrance;Assist for transportation;A lot of help with walking and/or transfers;Direct supervision/assist for  medications management   Equipment Recommendations  Rolling walker (2 wheels);BSC/3in1    Recommendations for Other Services       Precautions / Restrictions Precautions Precautions: Fall Restrictions Weight Bearing Restrictions: No     Mobility  Bed Mobility Overal bed mobility: Needs Assistance Bed Mobility: Supine to Sit, Sit to Supine     Supine to sit: Mod assist, +2 for physical assistance Sit to supine: Mod assist, +2 for physical assistance        Transfers Overall transfer level: Needs assistance Equipment used: Rolling walker (2 wheels) Transfers: Sit to/from Stand Sit to Stand: Min assist, +2 physical assistance                Ambulation/Gait Ambulation/Gait assistance: Min assist Gait Distance (Feet): 3 Feet Assistive device: Rolling walker (2 wheels)   Gait velocity: decreased     General Gait Details: side step at EOB, needed PT assist  for RW management   Stairs             Wheelchair Mobility    Modified Rankin (Stroke Patients Only)       Balance Overall balance assessment: Needs assistance Sitting-balance support: Feet supported, Bilateral upper extremity supported Sitting balance-Leahy Scale: Fair Sitting balance - Comments: progressed to fair with assistance to reposition in midline, though pt able to minimally scoot forward in prep for standing, CGA   Standing balance support: Bilateral upper extremity supported Standing balance-Leahy Scale: Poor                              Cognition Arousal/Alertness: Awake/alert Behavior During Therapy: Flat affect Overall Cognitive Status: Impaired/Different from baseline Area of Impairment: Following commands, Safety/judgement  Following Commands: Follows one step commands with increased time Safety/Judgement: Decreased awareness of deficits, Decreased awareness of safety   Problem Solving: Requires verbal cues, Difficulty  sequencing General Comments: Some confusion, some re-direction needed        Exercises      General Comments        Pertinent Vitals/Pain Pain Assessment Pain Assessment: Faces Faces Pain Scale: Hurts little more Pain Location: sacrum wounds with mobility Pain Descriptors / Indicators: Discomfort, Grimacing, Aching Pain Intervention(s): Limited activity within patient's tolerance, Monitored during session, Repositioned    Home Living                          Prior Function            PT Goals (current goals can now be found in the care plan section) Progress towards PT goals: Progressing toward goals    Frequency    Min 2X/week      PT Plan Current plan remains appropriate    Co-evaluation              AM-PAC PT "6 Clicks" Mobility   Outcome Measure  Help needed turning from your back to your side while in a flat bed without using bedrails?: A Lot Help needed moving from lying on your back to sitting on the side of a flat bed without using bedrails?: A Lot Help needed moving to and from a bed to a chair (including a wheelchair)?: A Lot Help needed standing up from a chair using your arms (e.g., wheelchair or bedside chair)?: A Lot Help needed to walk in hospital room?: Total Help needed climbing 3-5 steps with a railing? : Total 6 Click Score: 10    End of Session Equipment Utilized During Treatment: Gait belt Activity Tolerance: Patient limited by pain;Patient limited by fatigue Patient left: in bed;with call bell/phone within reach;with bed alarm set Nurse Communication: Mobility status PT Visit Diagnosis: Unsteadiness on feet (R26.81);Muscle weakness (generalized) (M62.81);Difficulty in walking, not elsewhere classified (R26.2)     Time: 4097-3532 PT Time Calculation (min) (ACUTE ONLY): 25 min  Charges:  $Therapeutic Activity: 23-37 mins                     Lieutenant Diego PT, DPT 4:02 PM,01/13/22

## 2022-01-13 NOTE — Progress Notes (Addendum)
   01/12/22 0700  Assess: MEWS Score  Temp (!) 102.4 F (39.1 C) (nurse notified)  BP (!) 145/64 (nurse notified)  MAP (mmHg) 86  Pulse Rate 81  Resp 20  SpO2 98 %  O2 Device Nasal Cannula  Assess: MEWS Score  MEWS Temp 2  MEWS Systolic 0  MEWS Pulse 0  MEWS RR 0  MEWS LOC 0  MEWS Score 2  MEWS Score Color Yellow  Assess: if the MEWS score is Yellow or Red  Were vital signs taken at a resting state? Yes  Focused Assessment No change from prior assessment  Does the patient meet 2 or more of the SIRS criteria? No  Does the patient have a confirmed or suspected source of infection? Yes  Provider and Rapid Response Notified? No  MEWS guidelines implemented *See Row Information* Yes  Treat  MEWS Interventions Administered scheduled meds/treatments  Pain Scale 0-10  Pain Score 8  Pain Type Acute pain  Pain Location Leg  Pain Orientation Right;Left  Pain Descriptors / Indicators Pounding;Aching;Tightness  Take Vital Signs  Increase Vital Sign Frequency  Yellow: Q 2hr X 2 then Q 4hr X 2, if remains yellow, continue Q 4hrs  Escalate  MEWS: Escalate Yellow: discuss with charge nurse/RN and consider discussing with provider and RRT  Notify: Charge Nurse/RN  Name of Charge Nurse/RN Notified Jo RN  Date Charge Nurse/RN Notified 01/13/22  Time Charge Nurse/RN Notified 0800  Document  Patient Outcome Other (Comment) (will administer PRN meds and continue to monitor.)  Progress note created (see row info) Yes  Assess: SIRS CRITERIA  SIRS Temperature  1  SIRS Pulse 0  SIRS Respirations  0  SIRS WBC 0  SIRS Score Sum  1

## 2022-01-13 NOTE — Progress Notes (Signed)
RN and NT attempted to clean up pt who is soiled with stool. Pt refused to be cleaned up at this time stating, she needed her medication to take effect first. Explained to pt that would return in 1 hour to re attempt cleaning of pt. Pt has also declined changing of wound dressing three times requesting RN return. Will continue to attempt with performing of intentional rounds.

## 2022-01-13 NOTE — Progress Notes (Signed)
Brief Note Reviewed soft tissue US; no evidence of abscess.  Last fever at 1630; none overnight   No further indication for additional debridements or I&D at this time Will follow peripherally today We would like to be present tomorrow (06/14) for bedside wound vac change to evaluate gluteal wounds; will discuss with [place] Please call with questions/concerns   -- Theresa Simon, PA-C [facility] 01/13/2022, 8:02 AM M-F: 7am - 4pm

## 2022-01-13 NOTE — Progress Notes (Addendum)
Date of Admission:  12/31/2021      ID: Theresa Warren is a 66 y.o. female  Principal Problem:   Severe sepsis ([place]) Active Problems:   OSA (obstructive sleep apnea)   Chronic kidney disease, stage 5 (HCC)   Essential hypertension   DNR (do not resuscitate)/DNI(Do Not Intubate)   Type 2 diabetes mellitus with chronic kidney disease, with long-term current use of insulin (HCC)   Hypomagnesemia   Pressure injury of skin   Symptomatic anemia   Elevated LFTs   Sacral wound, initial encounter   Swelling of left lower extremity   Abdominal pain   AKI (acute kidney injury) ([place])   Hyponatremia   Premature atrial complexes   Anasarca associated with disorder of kidney   Fever    Subjective: In lot of pain   Medications:   (feeding supplement) PROSource Plus  30 mL Oral BID BM   vitamin C  500 mg Oral BID   atorvastatin  40 mg Oral Daily   Chlorhexidine Gluconate Cloth  6 each Topical Q0600   fluticasone  2 spray Each Nare Daily   furosemide  40 mg Oral Daily   gabapentin  100 mg Oral TID   heparin injection (subcutaneous)  5,000 Units Subcutaneous Q8H   insulin aspart  0-5 Units Subcutaneous QHS   insulin aspart  0-9 Units Subcutaneous TID WC   insulin glargine-yfgn  5 Units Subcutaneous Daily   leptospermum manuka honey  1 application  Topical Daily   loratadine  10 mg Oral Daily   metoprolol succinate  50 mg Oral BID   multivitamin  1 tablet Oral QHS   nutrition supplement (JUVEN)  1 packet Oral BID BM   zinc sulfate  220 mg Oral Daily    Objective: Vital signs in last 24 hours: Temp:  [97.5 F (36.4 C)-100.6 F (38.1 C)] 99.3 F (37.4 C) (06/13 1143) Pulse Rate:  [64-90] 74 (06/13 1143) Resp:  [15-18] 17 (06/13 1143) BP: (118-141)/(51-60) 120/52 (06/13 1143) SpO2:  [93 %-100 %] 94 % (06/13 1143) Weight:  [102.7 kg] 102.7 kg (06/13 0500)  LDA Rt IJ HD  PHYSICAL EXAM:  General: Awake, chronically ill.  Head: Normocephalic, without obvious  abnormality, atraumatic. Eyes: Conjunctivae clear, anicteric sclerae. Pupils are equal ENT Nares normal. No drainage or sinus tenderness. Lips, mucosa, and tongue normal. No Thrush Neck: Supple, symmetrical, no adenopathy, thyroid: non tender no carotid bruit and no JVD. Lungs: b/l air entry Heart:Tachycardia Abdomen: Soft, pannus Extremities: b/l inner thigh induration and mottling and some ischemia in the center Left calf lesion          Skin: macular rash which is fading and likely old   Lymph: Cervical, supraclavicular normal. Neurologic: Grossly non-focal  Lab Results Recent Labs    01/11/22 0625 01/11/22 1752 01/11/22 1752 01/12/22 1053 01/12/22 1054 01/13/22 1117  WBC  --  11.3*  --   --  13.7*  13.6*  --   HGB  --  7.9*   < >  --  7.4*  7.4* 7.7*  HCT  --  25.2*   < >  --  23.5*  23.2* 24.0*  NA 135  --   --  134*  --   --   K 3.6  --   --  3.8  --   --   CL 96*  --   --  95*  --   --   CO2 28  --   --  26  --   --   BUN 60*  --   --  70*  --   --   CREATININE 3.01*  --   --  5.02*  --   --    < > = values in this interval not displayed.   Liver Panel Recent Labs    01/12/22 1053  PROT 6.3*  ALBUMIN 2.0*  2.1*  AST 262*  ALT 156*  ALKPHOS 299*  BILITOT 0.9  BILIDIR 0.2  IBILI 0.7   Sedimentation Rate No results for input(s): "ESRSEDRATE" in the last 72 hours. C-Reactive Protein No results for input(s): "CRP" in the last 72 hours.  Microbiology: 6/10 BC- NG 6/11 BC- NG 6/1 WC- MSSA/ enterococcus  Studies/Results: [place] RT LOWER EXTREM LTD SOFT TISSUE NON VASCULAR  Result Date: 01/12/2022 CLINICAL DATA:  Right leg edema in thigh and calf EXAM: ULTRASOUND RIGHT LOWER EXTREMITY LIMITED TECHNIQUE: Ultrasound examination of the lower extremity soft tissues was performed in the area of clinical concern. COMPARISON:  None Available. FINDINGS: No focal mass or fluid collection.  Soft tissue edema noted. IMPRESSION: No mass or fluid collection.  Electronically Signed   By: Rolm Baptise M.D.   On: 01/12/2022 19:20   US Venous Img Lower Bilateral (DVT)  Result Date: 01/12/2022 CLINICAL DATA:  Swelling EXAM: BILATERAL LOWER EXTREMITY VENOUS DOPPLER ULTRASOUND TECHNIQUE: Gray-scale sonography with compression, as well as color and duplex ultrasound, were performed to evaluate the deep venous system(s) from the level of the common femoral vein through the popliteal and proximal calf veins. COMPARISON:  None Available. FINDINGS: VENOUS Normal compressibility of the common femoral, superficial femoral, and popliteal veins, as well as the visualized calf veins. Visualized portions of profunda femoral vein and great saphenous vein unremarkable. No filling defects to suggest DVT on grayscale or color Doppler imaging. Doppler waveforms show normal direction of venous flow, normal respiratory plasticity and response to augmentation. Limited views of the contralateral common femoral vein are unremarkable. OTHER None. Limitations: Left distal femoral vein and calf veins not visualized due to edema. IMPRESSION: No evidence of lower extremity DVT with limitations in the left leg as above. Electronically Signed   By: Rolm Baptise M.D.   On: 01/12/2022 19:19   [place] LT LOWER EXTREM LTD SOFT TISSUE NON VASCULAR  Result Date: 01/12/2022 CLINICAL DATA:  Left leg swelling.  Evaluate thigh and calf EXAM: ULTRASOUND LEFT LOWER EXTREMITY LIMITED TECHNIQUE: Ultrasound examination of the lower extremity soft tissues was performed in the area of clinical concern. COMPARISON:  None Available. FINDINGS: No focal mass or fluid collection in the areas of concern in the left thigh and calf. Soft tissue edema. IMPRESSION: No focal mass or fluid collection. Electronically Signed   By: Rolm Baptise M.D.   On: 01/12/2022 19:18   DG Chest Port 1 View  Result Date: 01/11/2022 CLINICAL DATA:  Fever EXAM: PORTABLE CHEST 1 VIEW COMPARISON:  Previous studies including the examination of  01/10/2022 FINDINGS: Transverse diameter of heart is increased. Central pulmonary vessels are more prominent. This may be partly due to poor inspiration in the current study. There is no focal pulmonary consolidation. There is no pleural effusion or pneumothorax. Tip of dialysis catheter is seen in the region of right atrium. IMPRESSION: Central pulmonary vessels are more prominent which may be due to poor inspiration or suggest CHF. No new focal pulmonary infiltrates are seen. There is no pleural effusion. Electronically Signed   By: Elmer Picker M.D.   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 Drug related  Anemia Vaginal bleed post menopausal Endometrial thickening on Scan- r/o malignancy  Dm- on insulin  CKD Stage V-ESRD on dialysis- started this admission   Discussed the management with the patient and care team

## 2022-01-13 NOTE — Progress Notes (Signed)
PROGRESS NOTE    Theresa Warren  WHQ:[id number] DOB: April 12, 1956 DOA: 12/31/2021 PCP: Crist Infante, MD    Brief Narrative:  [name] is a 66 year old female hyperlipidemia, hypertension, CKD stage V, insulin-dependent diabetes mellitus, who presents emergency department from [facility] for evaluation of low sodium noted on labs at the facility.  She was discharged to rehab on 5/23 after being admitted following a fall and also with acute on chronic anemia.   ED Course - temp 99.6, HR 107, otherwise unremarkable vitals. Labs with sodium 129, potassium 5.1, chloride 99, bicarb 17, BUN of 100, serum creatinine of 4.61, GFR of 10, WBC 18.4, hemoglobin 6.9, platelets of 473.  LFT's were also elevated.   Patient met criteria for sepsis on arrival, treated per protocol in the ED with IV Cefepime, Flagyl, Vanc, and LR 1 L bolus fluids.  Infection source unclear but felt possibly due to sacral wound infection and concern for underlying osteomyelitis.   Admitted to the hospitalist service for further evaluation and management.  Continued on IV Cefepime and Vanc.   General surgery was consulted for debridement on sacral pressure injury.  Nephrology consulted due to worsening renal function  Week summary: Patient was lethargic and displayed uremic symptoms.  She was started on hemodialysis after tunneled dialysis catheter was placed by vascular surgery on 6/9.  First dialysis was started on 6/9.  In the evening she was febrile and has been spiking fevers for the past few days.  She was placed on IV meropenem broad-spectrum and ID was consulted.  Also she has left thigh induration wounds soft tissue ultrasound was negative and she was also negative for bilateral DVTs.   Consultants:  Surgery, vascular surgery, nephrology,ID  Procedures:   Antimicrobials:  Keflex Meropenem   Subjective: Feels better today.  Going to try to eat breakfast.  Objective: Vitals:   01/13/22  0018 01/13/22 0500 01/13/22 0510 01/13/22 0806  BP: (!) 118/51  (!) 127/55 134/60  Pulse: 64  72 75  Resp: _0 Temp: 98.8 F (37.1 C)  (!) 97.5 F (36.4 C) 97.9 F (36.6 C)  TempSrc: Oral  Oral Oral  SpO2: 99%  93% 95%  Weight:  102.7 kg    Height:        Intake/Output Summary (Last 24 hours) at 01/13/2022 0955 Last data filed at 01/13/2022 0326 Gross per 24 hour  Intake 100 ml  Output 1501 ml  Net -1401 ml   Filed Weights   01/12/22 1155 01/12/22 1500 01/13/22 0500  Weight: 101.8 kg 99.2 kg 102.7 kg    Examination: Calm, NAD Cta no w/r Reg s1/s2 no gallop Soft benign +bs +LE edema up to thigh.  Aaoxox3  Mood and affect appropriate in current setting     Data Reviewed: I have personally reviewed following labs and imaging studies  CBC: Recent Labs  Lab 01/07/22 0629 01/08/22 0941 01/09/22 0959 01/11/22 1752 01/12/22 1054  WBC 10.4 11.6* 11.1* 11.3* 13.7*  13.6*  NEUTROABS  --   --   --   --  11.0*  HGB 9.1* 8.4* 8.1* 7.9* 7.4*  7.4*  HCT 28.5* 26.1* 25.7* 25.2* 23.5*  23.2*  MCV 93.4 93.9 93.1 93.7 94.0  92.4  PLT 435* 445* 396 295 270  659   Basic Metabolic Panel: Recent Labs  Lab 01/07/22 0629 01/08/22 0941 01/09/22 0959 01/11/22 0625 01/12/22 1053  NA 132* 134* 137 135 134*  K 4.9 4.9 4.7 3.6 3.8  CL 96* 95* 100 96* 95*  CO2 _0 GLUCOSE 215* 145* 87 99 124*  BUN 120* 128* 123* 60* 70*  CREATININE 4.17* 4.36* 4.73* 3.01* 5.02*  CALCIUM 10.2 10.1 9.2 9.3 8.8*  MG 1.8  --   --   --   --   PHOS  --  4.1 4.0  --  5.0*   GFR: Estimated Creatinine Clearance: 13 mL/min (A) (by C-G formula based on SCr of 5.02 mg/dL (H)). Liver Function Tests: Recent Labs  Lab 01/08/22 0941 01/09/22 0959 01/12/22 1053  AST  --   --  262*  ALT  --   --  156*  ALKPHOS  --   --  299*  BILITOT  --   --  0.9  PROT  --   --  6.3*  ALBUMIN 2.9* 2.5* 2.0*  2.1*   No results for input(s): "LIPASE", "AMYLASE" in the last 168 hours. No  results for input(s): "AMMONIA" in the last 168 hours. Coagulation Profile: No results for input(s): "INR", "PROTIME" in the last 168 hours.  Cardiac Enzymes: No results for input(s): "CKTOTAL", "CKMB", "CKMBINDEX", "TROPONINI" in the last 168 hours. BNP (last 3 results) No results for input(s): "PROBNP" in the last 8760 hours. HbA1C: No results for input(s): "HGBA1C" in the last 72 hours. CBG: Recent Labs  Lab 01/11/22 2019 01/12/22 0825 01/12/22 1639 01/12/22 2127 01/13/22 0803  GLUCAP 164* 88 227* 244* 204*   Lipid Profile: No results for input(s): "CHOL", "HDL", "LDLCALC", "TRIG", "CHOLHDL", "LDLDIRECT" in the last 72 hours. Thyroid Function Tests: No results for input(s): "TSH", "T4TOTAL", "FREET4", "T3FREE", "THYROIDAB" in the last 72 hours. Anemia Panel: No results for input(s): "VITAMINB12", "FOLATE", "FERRITIN", "TIBC", "IRON", "RETICCTPCT" in the last 72 hours. Sepsis Labs: Recent Labs  Lab 01/11/22 1310  PROCALCITON 1.99     Recent Results (from the past 240 hour(s))  Culture, blood (Routine X 2) w Reflex to ID Panel     Status: None (Preliminary result)   Collection Time: 01/10/22  8:48 AM   Specimen: BLOOD  Result Value Ref Range Status   Specimen Description BLOOD BLOOD RIGHT HAND  Final   Special Requests   Final    BOTTLES DRAWN AEROBIC AND ANAEROBIC Blood Culture results may not be optimal due to an inadequate volume of blood received in culture bottles   Culture   Final    NO GROWTH 3 DAYS Performed at [facility], [address] [place], [place] [address]    Report Status PENDING  Incomplete  Culture, blood (Routine X 2) w Reflex to ID Panel     Status: None (Preliminary result)   Collection Time: 01/10/22  9:01 AM   Specimen: BLOOD  Result Value Ref Range Status   Specimen Description BLOOD LEFT ANTECUBITAL  Final   Special Requests   Final    BOTTLES DRAWN AEROBIC AND ANAEROBIC Blood Culture adequate volume   Culture   Final     NO GROWTH 3 DAYS Performed at [facility], [address], [place], [place] [address]    Report Status PENDING  Incomplete  Culture, blood (Routine X 2) w Reflex to ID Panel     Status: None (Preliminary result)   Collection Time: 01/11/22  5:52 PM   Specimen: BLOOD  Result Value Ref Range Status   Specimen Description BLOOD [facility]  Final   Special Requests BOTTLES DRAWN AEROBIC AND ANAEROBIC [place]  Final   Culture  Final    NO GROWTH 2 DAYS Performed at [facility], [address] [place], [place] [address]    Report Status PENDING  Incomplete  Culture, blood (Routine X 2) w Reflex to ID Panel     Status: None (Preliminary result)   Collection Time: 01/11/22  6:00 PM   Specimen: BLOOD  Result Value Ref Range Status   Specimen Description BLOOD LAC  Final   Special Requests BOTTLES DRAWN AEROBIC AND ANAEROBIC BCAV  Final   Culture   Final    NO GROWTH 2 DAYS Performed at [facility], [address] [place], [place] [address]    Report Status PENDING  Incomplete         Radiology Studies: [place] RT LOWER EXTREM LTD SOFT TISSUE NON VASCULAR  Result Date: 01/12/2022 CLINICAL DATA:  Right leg edema in thigh and calf EXAM: ULTRASOUND RIGHT LOWER EXTREMITY LIMITED TECHNIQUE: Ultrasound examination of the lower extremity soft tissues was performed in the area of clinical concern. COMPARISON:  None Available. FINDINGS: No focal mass or fluid collection.  Soft tissue edema noted. IMPRESSION: No mass or fluid collection. Electronically Signed   By: Rolm Baptise M.D.   On: 01/12/2022 19:20   US Venous Img Lower Bilateral (DVT)  Result Date: 01/12/2022 CLINICAL DATA:  Swelling EXAM: BILATERAL LOWER EXTREMITY VENOUS DOPPLER ULTRASOUND TECHNIQUE: Gray-scale sonography with compression, as well as color and duplex ultrasound, were performed to evaluate the deep venous system(s) from the level of the common femoral vein through the popliteal and proximal calf veins.  COMPARISON:  None Available. FINDINGS: VENOUS Normal compressibility of the common femoral, superficial femoral, and popliteal veins, as well as the visualized calf veins. Visualized portions of profunda femoral vein and great saphenous vein unremarkable. No filling defects to suggest DVT on grayscale or color Doppler imaging. Doppler waveforms show normal direction of venous flow, normal respiratory plasticity and response to augmentation. Limited views of the contralateral common femoral vein are unremarkable. OTHER None. Limitations: Left distal femoral vein and calf veins not visualized due to edema. IMPRESSION: No evidence of lower extremity DVT with limitations in the left leg as above. Electronically Signed   By: Rolm Baptise M.D.   On: 01/12/2022 19:19   [place] LT LOWER EXTREM LTD SOFT TISSUE NON VASCULAR  Result Date: 01/12/2022 CLINICAL DATA:  Left leg swelling.  Evaluate thigh and calf EXAM: ULTRASOUND LEFT LOWER EXTREMITY LIMITED TECHNIQUE: Ultrasound examination of the lower extremity soft tissues was performed in the area of clinical concern. COMPARISON:  None Available. FINDINGS: No focal mass or fluid collection in the areas of concern in the left thigh and calf. Soft tissue edema. IMPRESSION: No focal mass or fluid collection. Electronically Signed   By: Rolm Baptise M.D.   On: 01/12/2022 19:18   DG Chest Port 1 View  Result Date: 01/11/2022 CLINICAL DATA:  Fever EXAM: PORTABLE CHEST 1 VIEW COMPARISON:  Previous studies including the examination of 01/10/2022 FINDINGS: Transverse diameter of heart is increased. Central pulmonary vessels are more prominent. This may be partly due to poor inspiration in the current study. There is no focal pulmonary consolidation. There is no pleural effusion or pneumothorax. Tip of dialysis catheter is seen in the region of right atrium. IMPRESSION: Central pulmonary vessels are more prominent which may be due to poor inspiration or suggest CHF. No new focal  pulmonary infiltrates are seen. There is no pleural effusion. Electronically Signed   By: Prudy Feeler.D.  On: 01/11/2022 17:59        Scheduled Meds:  (feeding supplement) PROSource Plus  30 mL Oral BID BM   vitamin C  500 mg Oral BID   atorvastatin  40 mg Oral Daily   Chlorhexidine Gluconate Cloth  6 each Topical Q0600   fluticasone  2 spray Each Nare Daily   furosemide  40 mg Oral Daily   gabapentin  100 mg Oral TID   heparin injection (subcutaneous)  5,000 Units Subcutaneous Q8H   insulin aspart  0-5 Units Subcutaneous QHS   insulin aspart  0-9 Units Subcutaneous TID WC   leptospermum manuka honey  1 application  Topical Daily   loratadine  10 mg Oral Daily   metoprolol succinate  50 mg Oral BID   multivitamin  1 tablet Oral QHS   nutrition supplement (JUVEN)  1 packet Oral BID BM   zinc sulfate  220 mg Oral Daily   Continuous Infusions:  meropenem (MERREM) IV 500 mg (01/12/22 1735)     Assessment & Plan:   Principal Problem:   Severe sepsis ([place]) Active Problems:   Chronic kidney disease, stage 5 ([place])   Essential hypertension   DNR (do not resuscitate)/DNI(Do Not Intubate)   Type 2 diabetes mellitus with chronic kidney disease, with long-term current use of insulin ([place])   Sacral wound, initial encounter   Swelling of left lower extremity   Abdominal pain   AKI (acute kidney injury) ([place])   Hypomagnesemia   Symptomatic anemia   Hyponatremia   Premature atrial complexes   OSA (obstructive sleep apnea)   Pressure injury of skin   Elevated LFTs   Anasarca associated with disorder of kidney   Fever    * Severe sepsis (HCC) POA as evidenced by tachycardia, leukocytosis, AKI and transaminitis consistent with organ dysfunction and severe sepsis with suspected infection source being sacral wounds versus an abdominal source (CT abdomen pelvis negative for anything acute however).  MRSA PCR negative. Wound cultures grew few Staph aureus, rare Enterococcus  faecalis. Initially treated with vancomycin/cefepime. Vanc stopped 6/4. Plan to treat MSSA in both cultures.  Enterococcus likely contaminated from stool d/T location of wounds and MRSE likely colonization of skin .  6/13 was Started on Keflex, but due to spiking fevers below , started on broad spectrum abx with meropenm.    Fever On 6/9 evening 6/12 febrile again. Bcx tdn, cxr no pna. Legs feels swollen and warm /tender in areas of healed wound, asked gsx reassess make sure no abscess.  6/13 ID was consulted Venous US negative dvt.  Soft tissue US per Gsx request, negative Keflex was discontinued and started on IV meropenem Per ID fever could be secondary to infection with gram-negative rods of buttocks wounds versus questionable due to drug allergy question from calciphylaxix versus skin rash Labs ordered by ID May need bx, will d/w with ID. ID rec. sodium thiosulphate, but not given, will f/u to see if still should give .       Sacral Decubitus wound Continue abx as above Gsx following, felt wounds stable  S/p debridement of three decubitus wounds of b/l buttocks and wound vac placed No further debridment at this time. Healing challenging given her immobility , poor functional status, and poor nutritional status. Local wound care; pressure offload, freq. Repositioning, low air loss mattress.  Pain control prn 6/12 continue wound VAC: Change WMD; [place] RN on board to assist with change.    Type 2 diabetes mellitus with chronic  kidney disease, with long-term current use of insulin (HCC) BG started running low her standing insulin was d/c'd as her po intake was very poor. Today BG more elevated, will start with lantus 5 units qd. Increase as needed.   Essential hypertension Losartan held due to AKI. On p.o. Lasix started by nephrology  IV hydralazine as needed  Overall stable     AKI on Chronic kidney disease, stage 5 (HCC) Hyponatremia Treated IV Lasix and albumin  per nephrology.  AKI secondary to underlying illness  CKD secondary to diabetes and hypertension  No exposure to IV contrast  CT abdomen pelvis negative for obstruction, several renal stones identified.   Losartan held due to AKI  Treated with albumin and then was  stopped 6/9 exhibiting uremic symptoms today.  status post permacath placement on 6/9 by Dr. Delana Meyer first HD on 6/9 Uremic sx appears to be improving. MS improved HD on 6/13.        Abdominal pain Patient reported on admission.  CT of abdomen pelvis showed abnormal endometrial thickening for a postmenopausal patient, bladder distention estimated 520 mL, no other acute or inflammatory processes or acute findings. --Monitor clinically for now -- Needs OB/GYN follow-up for endometrial sampling to exclude endometrial carcinoma no further complaints    Swelling of left lower extremity With redness, patient attributes it to clindamycin.  She reported on admission that this has been present for over one month.   Left lower extremity Doppler US on 12/18/21 was negative for DVT.  Venous US repeated on 6/12 b/l negative for DVT      Symptomatic anemia Hemoglobin on admission was 6.9 >> 6.3, down from 7.6 at time of discharge on 5/23.  No signs of active bleeding, did have vaginal/endometrial bleeding previous admission but no reports of this recently.  May have slow chronic blood loss from sacral wounds.  Has baseline anemia of chronic disease due to renal failure. Received 2 unit PRBC transfusion  -- Trend hemoglobin and transfuse if less than 7 6/13 will ck h/h today , may need another transfusion   Hypomagnesemia Resolved with replacement on 6/3. Monitor and replace Mg as needed.     Premature atrial complexes Patient became significantly tachycardic on 6/2 with heart rates sustaining in the 130s.  Cardiology reviewed telemetry and feels patient dysrhythmias were not A-fib but sinus tach with PACs. --Cardiology  following On beta blk     Elevated LFTs On admission, alk phos 212, AST 182, ALT 107.  Unclear etiology, repeat LFTs not available today.  Appears LFTs were elevated at time of discharge on 5/23 but have increased since. RUQ ultrasound unremarkable. Acute hepatitis panel negative.  Pressure injury of skin Present on admission.  Patient has multiple sacral pressure injuries.  Some concern at time of admission that these were the source of sepsis. General surgery consulted and took patient to the OR on 6/1 for debridement.   Now with wound VAC. --Initial wound VAC change Monday 6/5 then every M/W/F -- Wound care consulted -- Frequent repositioning --Follow general surgery recommendations --Pain control Bcx negative Pressure Injury 01/01/22 Buttocks Left Stage 3 -  Full thickness tissue loss. Subcutaneous fat may be visible but bone, tendon or muscle are NOT exposed. (Active)  01/01/22 0222  Location: Buttocks  Location Orientation: Left  Staging: Stage 3 -  Full thickness tissue loss. Subcutaneous fat may be visible but bone, tendon or muscle are NOT exposed.  Wound Description (Comments):   Present on Admission: Yes  Pressure Injury 01/01/22 Buttocks Right Stage 2 -  Partial thickness loss of dermis presenting as a shallow open injury with a red, pink wound bed without slough. (Active)  01/01/22 0223  Location: Buttocks  Location Orientation: Right  Staging: Stage 2 -  Partial thickness loss of dermis presenting as a shallow open injury with a red, pink wound bed without slough.  Wound Description (Comments):   Present on Admission: Yes     Pressure Injury 01/01/22 Hip Left Stage 2 -  Partial thickness loss of dermis presenting as a shallow open injury with a red, pink wound bed without slough. (Active)  01/01/22 0225  Location: Hip  Location Orientation: Left  Staging: Stage 2 -  Partial thickness loss of dermis presenting as a shallow open injury with a red, pink wound bed  without slough.  Wound Description (Comments):   Present on Admission: Yes     Pressure Injury 01/01/22 Hip Right Stage 3 -  Full thickness tissue loss. Subcutaneous fat may be visible but bone, tendon or muscle are NOT exposed. (Active)  01/01/22 0225  Location: Hip  Location Orientation: Right  Staging: Stage 3 -  Full thickness tissue loss. Subcutaneous fat may be visible but bone, tendon or muscle are NOT exposed.  Wound Description (Comments):   Present on Admission: Yes          OSA (obstructive sleep apnea) .  Appears not to be on CPAP       DVT prophylaxis: SCD Code Status: DNR Family Communication: None at bedside Disposition Plan:  Status is: Inpatient Remains inpatient appropriate because: renal function worsening, on HD now, needs HD as outpatient, needs SNF. Febrile w/u      LOS: 12 days   Time spent 35 minutes with more than 50% on [place], MD [facility] Pager [phone]  If 7PM-7AM, please contact night-coverage 01/13/2022, 9:55 AM

## 2022-01-13 NOTE — Progress Notes (Addendum)
[facility]  PROGRESS NOTE   Subjective:   Patient seen sitting up in bed, alert and oriented to self, place, and situation. Tolerating meals.  Appetite remains poor.  Denies nausea and vomiting. Patient remains on room air, denies shortness of breath Complains of discomfort at sacral wound.   Objective:  Vital signs: Blood pressure 134/60, pulse 75, temperature 97.9 F (36.6 C), temperature source Oral, resp. rate 16, height 5\' 3"  (1.6 m), weight 102.7 kg, SpO2 95 %.  Intake/Output Summary (Last 24 hours) at 01/13/2022 1036 Last data filed at 01/13/2022 0326 Gross per 24 hour  Intake 100 ml  Output 1501 ml  Net -1401 ml    Filed Weights   01/12/22 1155 01/12/22 1500 01/13/22 0500  Weight: 101.8 kg 99.2 kg 102.7 kg     Physical Exam: General:  No acute distress  Head:  Normocephalic, atraumatic. Moist oral mucosal membranes  Eyes:  Anicteric  Lungs:   Clear to auscultation, normal effort  Heart:  S1S2 no rubs  Abdomen:   Soft, nontender, bowel sounds present  Extremities:  + peripheral edema.  Neurologic:  Awake, alert, following commands  Skin:  No lesions  Access: RT Permcath    Basic Metabolic Panel: Recent Labs  Lab 01/07/22 0629 01/08/22 0941 01/09/22 0959 01/11/22 0625 01/12/22 1053  NA 132* 134* 137 135 134*  K 4.9 4.9 4.7 3.6 3.8  CL 96* 95* 100 96* 95*  CO2 24 27 26 28 26   GLUCOSE 215* 145* 87 99 124*  BUN 120* 128* 123* 60* 70*  CREATININE 4.17* 4.36* 4.73* 3.01* 5.02*  CALCIUM 10.2 10.1 9.2 9.3 8.8*  MG 1.8  --   --   --   --   PHOS  --  4.1 4.0  --  5.0*     CBC: Recent Labs  Lab 01/07/22 0629 01/08/22 0941 01/09/22 0959 01/11/22 1752 01/12/22 1054  WBC 10.4 11.6* 11.1* 11.3* 13.7*  13.6*  NEUTROABS  --   --   --   --  11.0*  HGB 9.1* 8.4* 8.1* 7.9* 7.4*  7.4*  HCT 28.5* 26.1* 25.7* 25.2* 23.5*  23.2*  MCV 93.4 93.9 93.1 93.7 94.0  92.4  PLT 435* 445* 396 295 270  255      Urinalysis: No results for  input(s): "COLORURINE", "LABSPEC", "PHURINE", "GLUCOSEU", "HGBUR", "BILIRUBINUR", "KETONESUR", "PROTEINUR", "UROBILINOGEN", "NITRITE", "LEUKOCYTESUR" in the last 72 hours.  Invalid input(s): "APPERANCEUR"    Imaging: [place] RT LOWER EXTREM LTD SOFT TISSUE NON VASCULAR  Result Date: 01/12/2022 CLINICAL DATA:  Right leg edema in thigh and calf EXAM: ULTRASOUND RIGHT LOWER EXTREMITY LIMITED TECHNIQUE: Ultrasound examination of the lower extremity soft tissues was performed in the area of clinical concern. COMPARISON:  None Available. FINDINGS: No focal mass or fluid collection.  Soft tissue edema noted. IMPRESSION: No mass or fluid collection. Electronically Signed   By: Rolm Baptise M.D.   On: 01/12/2022 19:20   US Venous Img Lower Bilateral (DVT)  Result Date: 01/12/2022 CLINICAL DATA:  Swelling EXAM: BILATERAL LOWER EXTREMITY VENOUS DOPPLER ULTRASOUND TECHNIQUE: Gray-scale sonography with compression, as well as color and duplex ultrasound, were performed to evaluate the deep venous system(s) from the level of the common femoral vein through the popliteal and proximal calf veins. COMPARISON:  None Available. FINDINGS: VENOUS Normal compressibility of the common femoral, superficial femoral, and popliteal veins, as well as the visualized calf veins. Visualized portions of profunda femoral vein and great saphenous vein unremarkable. No filling defects  to suggest DVT on grayscale or color Doppler imaging. Doppler waveforms show normal direction of venous flow, normal respiratory plasticity and response to augmentation. Limited views of the contralateral common femoral vein are unremarkable. OTHER None. Limitations: Left distal femoral vein and calf veins not visualized due to edema. IMPRESSION: No evidence of lower extremity DVT with limitations in the left leg as above. Electronically Signed   By: Rolm Baptise M.D.   On: 01/12/2022 19:19   [place] LT LOWER EXTREM LTD SOFT TISSUE NON VASCULAR  Result Date:  01/12/2022 CLINICAL DATA:  Left leg swelling.  Evaluate thigh and calf EXAM: ULTRASOUND LEFT LOWER EXTREMITY LIMITED TECHNIQUE: Ultrasound examination of the lower extremity soft tissues was performed in the area of clinical concern. COMPARISON:  None Available. FINDINGS: No focal mass or fluid collection in the areas of concern in the left thigh and calf. Soft tissue edema. IMPRESSION: No focal mass or fluid collection. Electronically Signed   By: Rolm Baptise M.D.   On: 01/12/2022 19:18   DG Chest Port 1 View  Result Date: 01/11/2022 CLINICAL DATA:  Fever EXAM: PORTABLE CHEST 1 VIEW COMPARISON:  Previous studies including the examination of 01/10/2022 FINDINGS: Transverse diameter of heart is increased. Central pulmonary vessels are more prominent. This may be partly due to poor inspiration in the current study. There is no focal pulmonary consolidation. There is no pleural effusion or pneumothorax. Tip of dialysis catheter is seen in the region of right atrium. IMPRESSION: Central pulmonary vessels are more prominent which may be due to poor inspiration or suggest CHF. No new focal pulmonary infiltrates are seen. There is no pleural effusion. Electronically Signed   By: Elmer Picker M.D.   On: 01/11/2022 17:59     Medications:    meropenem (MERREM) IV 500 mg (01/12/22 1735)    (feeding supplement) PROSource Plus  30 mL Oral BID BM   vitamin C  500 mg Oral BID   atorvastatin  40 mg Oral Daily   Chlorhexidine Gluconate Cloth  6 each Topical Q0600   fluticasone  2 spray Each Nare Daily   furosemide  40 mg Oral Daily   gabapentin  100 mg Oral TID   heparin injection (subcutaneous)  5,000 Units Subcutaneous Q8H   insulin aspart  0-5 Units Subcutaneous QHS   insulin aspart  0-9 Units Subcutaneous TID WC   insulin glargine-yfgn  5 Units Subcutaneous Daily   leptospermum manuka honey  1 application  Topical Daily   loratadine  10 mg Oral Daily   metoprolol succinate  50 mg Oral BID    multivitamin  1 tablet Oral QHS   nutrition supplement (JUVEN)  1 packet Oral BID BM   zinc sulfate  220 mg Oral Daily    Assessment/ Plan:     Principal Problem:   Severe sepsis (HCC) Active Problems:   OSA (obstructive sleep apnea)   Chronic kidney disease, stage 5 ([place])   Essential hypertension   DNR (do not resuscitate)/DNI(Do Not Intubate)   Type 2 diabetes mellitus with chronic kidney disease, with long-term current use of insulin (HCC)   Hypomagnesemia   Pressure injury of skin   Symptomatic anemia   Elevated LFTs   Sacral wound, initial encounter   Swelling of left lower extremity   Abdominal pain   AKI (acute kidney injury) ([place])   Hyponatremia   Premature atrial complexes   Anasarca associated with disorder of kidney   Fever  66 year old female with history of hypertension, diabetes,  peripheral vascular disease, hyperlipidemia, now with sepsis and acute kidney injury on the top of chronic kidney disease.  Patient is reached end-stage renal disease and was started on renal replacement therapy.   #1: End-stage renal disease: Patient has a permacath placed.  Patient received dialysis yesterday, tolerated well.  UF goal 1.5 L achieved.  Next treatment scheduled for Wednesday.  We will reduce UF goal and consideration of continued urination. Will order Albumin with treatment tomorrow to aid in fluid removal. Renal navigator aware of patient and outpatient clinic search in progress.  Monitoring discharge plan closely to determine adjustments in outpatient needs. Informed patient and staff of the need to sit in a recliner for at least 4 hours to be successful for outpatient treatment   #2: Sepsis/fever: Currently prescribed meropenem.  Negative pressure wound VAC remains in place.   #3: Anemia secondary to chronic kidney disease complicated by sepsis. Has received blood transfusions during this admission.  Hemoglobin 7.4, primary team considering blood transfusion.   #4:  Secondary hyperparathyroidism: We will continue monitoring bone minerals during this admission.  #5: Metabolic acidosis: CO2 improved.     #6: Congestive heart failure: Remains on daily furosemide 40 mg    LOS: [facility] 6/13/202310:36 AM

## 2022-01-13 NOTE — Progress Notes (Signed)
OT Cancellation Note  Patient Details Name: Theresa Warren MRN: [id number] DOB: [birth year]   Cancelled Treatment:    Reason Eval/Treat Not Completed: Other (comment). On attempt, pt with nursing staff for care. Will re-attempt OT tx at later date/time as pt is available.   Ardeth Perfect., MPH, MS, OTR/L ascom [phone] 01/13/22, 5:08 PM

## 2022-01-14 DIAGNOSIS — L89309 Pressure ulcer of unspecified buttock, unspecified stage: Secondary | ICD-10-CM | POA: Diagnosis not present

## 2022-01-14 DIAGNOSIS — R600 Localized edema: Secondary | ICD-10-CM | POA: Diagnosis not present

## 2022-01-14 DIAGNOSIS — A419 Sepsis, unspecified organism: Secondary | ICD-10-CM | POA: Diagnosis not present

## 2022-01-14 DIAGNOSIS — R6520 Severe sepsis without septic shock: Secondary | ICD-10-CM

## 2022-01-14 DIAGNOSIS — M7989 Other specified soft tissue disorders: Secondary | ICD-10-CM | POA: Diagnosis not present

## 2022-01-14 DIAGNOSIS — N186 End stage renal disease: Secondary | ICD-10-CM | POA: Diagnosis not present

## 2022-01-14 DIAGNOSIS — L89313 Pressure ulcer of right buttock, stage 3: Secondary | ICD-10-CM

## 2022-01-14 DIAGNOSIS — L89323 Pressure ulcer of left buttock, stage 3: Secondary | ICD-10-CM

## 2022-01-14 LAB — CK: Total CK: 10 U/L — ABNORMAL LOW (ref 38–234)

## 2022-01-14 LAB — GLUCOSE, CAPILLARY
Glucose-Capillary: 190 mg/dL — ABNORMAL HIGH (ref 70–99)
Glucose-Capillary: 123 mg/dL — ABNORMAL HIGH (ref 70–99)
Glucose-Capillary: 196 mg/dL — ABNORMAL HIGH (ref 70–99)
Glucose-Capillary: 270 mg/dL — ABNORMAL HIGH (ref 70–99)

## 2022-01-14 LAB — ANA COMPREHENSIVE PANEL

## 2022-01-14 LAB — RENAL FUNCTION PANEL
Albumin: 2 g/dL — ABNORMAL LOW (ref 3.5–5.0)
Anion gap: 13 (ref 5–15)
BUN: 58 mg/dL — ABNORMAL HIGH (ref 8–23)
CO2: 26 mmol/L (ref 22–32)
Calcium: 9.3 mg/dL (ref 8.9–10.3)
Chloride: 94 mmol/L — ABNORMAL LOW (ref 98–111)
Creatinine, Ser: 4.82 mg/dL — ABNORMAL HIGH (ref 0.44–1.00)
GFR, Estimated: 10 mL/min — ABNORMAL LOW (ref >60)
Glucose, Bld: 195 mg/dL — ABNORMAL HIGH (ref 70–99)
Phosphorus: 4 mg/dL (ref 2.5–4.6)
Potassium: 3.7 mmol/L (ref 3.5–5.1)
Sodium: 133 mmol/L — ABNORMAL LOW (ref 135–145)

## 2022-01-14 LAB — CBC
HCT: 24.8 % — ABNORMAL LOW (ref 36.0–46.0)
Hemoglobin: 7.8 g/dL — ABNORMAL LOW (ref 12.0–15.0)
MCH: 29.5 pg (ref 26.0–34.0)
MCHC: 31.5 g/dL (ref 30.0–36.0)
MCV: 93.9 fL (ref 80.0–100.0)
Platelets: 254 10*3/uL (ref 150–400)
RBC: 2.64 MIL/uL — ABNORMAL LOW (ref 3.87–5.11)
RDW: 14.8 % (ref 11.5–15.5)
WBC: 15.9 10*3/uL — ABNORMAL HIGH (ref 4.0–10.5)
nRBC: 0 % (ref 0.0–0.2)

## 2022-01-14 MED ORDER — LIDOCAINE-EPINEPHRINE 1 %-1:100000 IJ SOLN
20.0000 mL | Freq: Once | INTRAMUSCULAR | Status: DC
  Filled 2022-01-14: qty 20

## 2022-01-14 MED ORDER — HEPARIN SODIUM (PORCINE) 1000 UNIT/ML IJ SOLN
INTRAMUSCULAR | Status: AC
  Administered 2022-01-14: 4200 [IU] via INTRAVENOUS_CENTRAL
  Filled 2022-01-14: qty 10

## 2022-01-14 MED ORDER — SODIUM THIOSULFATE 250 MG/ML IV SOLN
25.0000 g | INTRAVENOUS | Status: DC
  Administered 2022-01-19: 25 g via INTRAVENOUS
  Filled 2022-01-14 (×3): qty 100

## 2022-01-14 MED ORDER — ALBUMIN HUMAN 25 % IV SOLN
INTRAVENOUS | Status: AC
  Administered 2022-01-14: 25 g via INTRAVENOUS
  Filled 2022-01-14: qty 100

## 2022-01-14 MED ORDER — OXYCODONE HCL 5 MG PO TABS
ORAL_TABLET | ORAL | Status: AC
  Filled 2022-01-14: qty 2

## 2022-01-14 MED ORDER — EPOETIN ALFA 10000 UNIT/ML IJ SOLN
INTRAMUSCULAR | Status: AC
  Administered 2022-01-14: 10000 [IU] via INTRAVENOUS
  Filled 2022-01-14: qty 1

## 2022-01-14 MED ORDER — PROSOURCE PLUS PO LIQD
30.0000 mL | Freq: Three times a day (TID) | ORAL | Status: DC
  Administered 2022-01-14 – 2022-01-29 (×27): 30 mL via ORAL
  Filled 2022-01-14 (×45): qty 30

## 2022-01-14 MED ORDER — EPOETIN ALFA 10000 UNIT/ML IJ SOLN
10000.0000 [IU] | INTRAMUSCULAR | Status: DC
  Administered 2022-01-16 – 2022-02-09 (×10): 10000 [IU] via INTRAVENOUS
  Filled 2022-01-14 (×11): qty 1

## 2022-01-14 NOTE — Progress Notes (Signed)
Nutrition Follow-up  DOCUMENTATION CODES:   Morbid obesity  INTERVENTION:   -Increase 30 ml Prosource Plus to TID, each supplement provides 100 kcals and 15 grams protein -Continue 1 packet Juven BID, each packet provides 95 calories, 2.5 grams of protein (collagen), and 9.8 grams of carbohydrate (3 grams sugar); also contains 7 grams of L-arginine and L-glutamine, 300 mg vitamin C, 15 mg vitamin E, 1.2 mcg vitamin B-12, 9.5 mg zinc, 200 mg calcium, and 1.5 g  Calcium Beta-hydroxy-Beta-methylbutyrate to support wound healing  -Continue renal MVI daily -Continue 500 mg vitamin C BID -Continue 220 mg zinc sulfate daily x 14 days  NUTRITION DIAGNOSIS:   Increased nutrient needs related to wound healing as evidenced by estimated needs.  Ongoing  GOAL:   Patient will meet greater than or equal to 90% of their needs  Progressing   MONITOR:   PO intake, Supplement acceptance, Diet advancement  REASON FOR ASSESSMENT:   Malnutrition Screening Tool    ASSESSMENT:   Pt with PMH of hyperlipidemia, hypertension, insulin-dependent diabetes mellitus, who presents from [facility] for chief concerns of abnormal serum sodium levels on labs.  5/31- s/p Debridement of three decubitus wounds of bilateral buttocks, total debridement area of 133 cm2;  Placement of negative pressure dressing > 50 cm. 6/7- vac changed 6/9- vac changed 6/9- HD cath placed, HD initiated 6/13- soft tissue ultrasound reveals no evidence of abscess  Reviewed I/O's: +120 ml x 24 hours and -6.5 L since admission  Pt unavailable at time of visit. Attempted to speak with pt via call to hospital room phone, however, unable to reach.   Pt with variable oral intake. Noted periods of lethargy and confusion. Meal completions 50-100%. Pt is taking supplements.   Per TOC notes, plan for SNF placement at discharge.   Medications reviewed and include lasix.   Labs reviewed: CBGS: [id number] (inpatient orders for  glycemic control are 0-5 units insulin aspart daily at bedtime, 5 units insulin glargine-yfgn daily, and 0-9 units insulin aspart TID with meals).    Diet Order:   Diet Order             Diet renal/carb modified with fluid restriction Diet-HS Snack? Nothing; Fluid restriction: 1200 mL Fluid; Room service appropriate? Yes; Fluid consistency: Thin  Diet effective now                   EDUCATION NEEDS:   Not appropriate for education at this time  Skin:  Skin Assessment: Skin Integrity Issues: Skin Integrity Issues:: DTI DTI: lt thigh Stage II: rt buttocks, bilateral buttocks, lt hips Stage III: lt buttocks, rt hip Wound Vac: buttocks  Last BM:  01/13/22  Height:   Ht Readings from Last 1 Encounters:  12/31/21 5\' 3"  (1.6 m)    Weight:   Wt Readings from Last 1 Encounters:  01/14/22 99.8 kg    Ideal Body Weight:  52.3 kg  BMI:  Body mass index is 38.97 kg/m.  Estimated Nutritional Needs:   Kcal:  1650-1850  Protein:  90-105 grams  Fluid:  1000 ml + UOP    Loistine Chance, RD, LDN, [place] Registered Dietitian II Certified Diabetes Care and Education Specialist Please refer to [facility] for RD and/or RD on-call/weekend/after hours pager

## 2022-01-14 NOTE — Progress Notes (Signed)
Date of Admission:  12/31/2021      ID: Theresa Warren is a 66 y.o. female  Principal Problem:   Severe sepsis (Theresa Warren) Active Problems:   OSA (obstructive sleep apnea)   Chronic kidney disease, stage 5 (HCC)   Essential hypertension   DNR (do not resuscitate)/DNI(Do Not Intubate)   Type 2 diabetes mellitus with chronic kidney disease, with long-term current use of insulin (HCC)   Hypomagnesemia   Pressure injury of skin   Symptomatic anemia   Elevated LFTs   Sacral wound, initial encounter   Swelling of left lower extremity   Abdominal pain   AKI (acute kidney injury) (Theresa Warren)   Hyponatremia   Premature atrial complexes   Anasarca associated with disorder of kidney   Fever    Subjective: In lot of pain Poor appetite  Medications:   (feeding supplement) PROSource Plus  30 mL Oral BID BM   vitamin C  500 mg Oral BID   atorvastatin  40 mg Oral Daily   Chlorhexidine Gluconate Cloth  6 each Topical Q0600   epoetin (EPOGEN/PROCRIT) injection  10,000 Units Intravenous Q M,W,F-HD   fluticasone  2 spray Each Nare Daily   furosemide  40 mg Oral Daily   gabapentin  100 mg Oral TID   heparin injection (subcutaneous)  5,000 Units Subcutaneous Q8H   heparin sodium (porcine)       insulin aspart  0-5 Units Subcutaneous QHS   insulin aspart  0-9 Units Subcutaneous TID WC   insulin glargine-yfgn  5 Units Subcutaneous Daily   leptospermum manuka honey  1 application  Topical Daily   loratadine  10 mg Oral Daily   metoprolol succinate  50 mg Oral BID   multivitamin  1 tablet Oral QHS   nutrition supplement (JUVEN)  1 packet Oral BID BM   oxyCODONE       zinc sulfate  220 mg Oral Daily    Objective: Vital signs in last 24 hours: Temp:  [97.9 F (36.6 C)-98.9 F (37.2 C)] 98.9 F (37.2 C) (06/14 1003) Pulse Rate:  [68-92] 80 (06/14 1200) Resp:  [12-27] 19 (06/14 1200) BP: (126-153)/(46-86) 153/63 (06/14 1200) SpO2:  [94 %-98 %] 98 % (06/14 1003) Weight:  [101.3  kg-101.8 kg] 101.3 kg (06/14 1003)  LDA Rt IJ HD  PHYSICAL EXAM:  General: Awake,  ill. In distress Neck: symmetrical, no adenopathy, thyroid: non tender Lungs: b/l air entry Heart:Tachycardia Abdomen: Soft, pannus Extremities: b/l inner thigh induration and mottling and some ischemia in the center Left calf lesion            Skin: macular rash which is fading and likely old   Lymph: Cervical, supraclavicular normal. Neurologic: Grossly non-focal  Lab Results Recent Labs    01/12/22 1053 01/12/22 1054 01/13/22 1117 01/14/22 0406  WBC  --  13.7*  13.6*  --  15.9*  HGB  --  7.4*  7.4* 7.7* 7.8*  HCT  --  23.5*  23.2* 24.0* 24.8*  NA 134*  --   --  133*  K 3.8  --   --  3.7  CL 95*  --   --  94*  CO2 26  --   --  26  BUN 70*  --   --  58*  CREATININE 5.02*  --   --  4.82*   Liver Panel Recent Labs    01/12/22 1053 01/14/22 0406  PROT 6.3*  --   ALBUMIN 2.0*  2.1* 2.0*  AST 262*  --   ALT 156*  --   ALKPHOS 299*  --   BILITOT 0.9  --   BILIDIR 0.2  --   IBILI 0.7  --    Sedimentation Rate No results for input(s): "ESRSEDRATE" in the last 72 hours. C-Reactive Protein No results for input(s): "CRP" in the last 72 hours.  Microbiology: 6/10 BC- NG 6/11 BC- NG 6/1 WC- MSSA/ enterococcus  Studies/Results: [place] RT LOWER EXTREM LTD SOFT TISSUE NON VASCULAR  Result Date: 01/12/2022 CLINICAL DATA:  Right leg edema in thigh and calf EXAM: ULTRASOUND RIGHT LOWER EXTREMITY LIMITED TECHNIQUE: Ultrasound examination of the lower extremity soft tissues was performed in the area of clinical concern. COMPARISON:  None Available. FINDINGS: No focal mass or fluid collection.  Soft tissue edema noted. IMPRESSION: No mass or fluid collection. Electronically Signed   By: Theresa Warren M.D.   On: 01/12/2022 19:20   US Venous Img Lower Bilateral (DVT)  Result Date: 01/12/2022 CLINICAL DATA:  Swelling EXAM: BILATERAL LOWER EXTREMITY VENOUS DOPPLER ULTRASOUND  TECHNIQUE: Gray-scale sonography with compression, as well as color and duplex ultrasound, were performed to evaluate the deep venous system(s) from the level of the common femoral vein through the popliteal and proximal calf veins. COMPARISON:  None Available. FINDINGS: VENOUS Normal compressibility of the common femoral, superficial femoral, and popliteal veins, as well as the visualized calf veins. Visualized portions of profunda femoral vein and great saphenous vein unremarkable. No filling defects to suggest DVT on grayscale or color Doppler imaging. Doppler waveforms show normal direction of venous flow, normal respiratory plasticity and response to augmentation. Limited views of the contralateral common femoral vein are unremarkable. OTHER None. Limitations: Left distal femoral vein and calf veins not visualized due to edema. IMPRESSION: No evidence of lower extremity DVT with limitations in the left leg as above. Electronically Signed   By: Theresa Warren M.D.   On: 01/12/2022 19:19   [place] LT LOWER EXTREM LTD SOFT TISSUE NON VASCULAR  Result Date: 01/12/2022 CLINICAL DATA:  Left leg swelling.  Evaluate thigh and calf EXAM: ULTRASOUND LEFT LOWER EXTREMITY LIMITED TECHNIQUE: Ultrasound examination of the lower extremity soft tissues was performed in the area of clinical concern. COMPARISON:  None Available. FINDINGS: No focal mass or fluid collection in the areas of concern in the left thigh and calf. Soft tissue edema. IMPRESSION: No focal mass or fluid collection. Electronically Signed   By: Theresa Warren M.D.   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 Drug related  Anemia Vaginal bleed post menopausal Endometrial thickening on Scan- r/o malignancy  Dm- on insulin  CKD Stage V-ESRD on dialysis- started this admission   Discussed the management with the  patient and care team Dr.Piscoya took 2 punch biopsies this afternoon

## 2022-01-14 NOTE — TOC Progression Note (Signed)
Transition of Care [facility]) - Progression Note    Patient Details  Name: Theresa Warren MRN: [id number] Date of Birth: 26-Nov-1955  [facility]) CM/SW [place], RN Phone Number: 01/14/2022, 9:50 AM  Clinical Narrative:   delayed entry from 13 Jan 2022:  RNCM spoke with patient at bedside re: disposition.  Patient lives at home and said she has no assistance whatsoever at home.  Patient gave [facility] permission to speak with her cousins Aleen Campi [phone]  (he lives in [place]) and Norge [phone] about disposition.    RNCM spoke with cousins who are concerned with patient returning to [facility] due to fall.  RNCM informed cousins of other bed offers and to refer to medicare.gov for ratings, especially since they are not local and not familiar with facilities.  [employer] will look at facilities and call rNCM back with disposition plan.  Both cousins asked the hospitalist contact them with information about patient's care plan and progress.  Dr. Kurtis Bushman notified.  TOC to follow.    Expected Discharge Plan:  (TBD) Barriers to Discharge: Continued Medical Work up  Expected Discharge Plan and Services Expected Discharge Plan:  (TBD)   Discharge Planning Services: CM Consult Post Acute Care Choice:  (TBD) Living arrangements for the past 2 months: Single Family Home                                       Social Determinants of Health (SDOH) Interventions    Readmission Risk Interventions     No data to display

## 2022-01-14 NOTE — Progress Notes (Addendum)
[facility]  PROGRESS NOTE   Subjective:   Patient briefly seen this morning resting in bed Appears comfortable.   Patient later seen and evaluated during dialysis   HEMODIALYSIS FLOWSHEET:  Blood Flow Rate (mL/min): 300 mL/min Arterial Pressure (mmHg): -110 mmHg Venous Pressure (mmHg): 110 mmHg Transmembrane Pressure (mmHg): 60 mmHg Ultrafiltration Rate (mL/min): 500 mL/min Dialysate Flow Rate (mL/min): 500 ml/min Conductivity: 13.9 Conductivity: 13.9 Dialysis Fluid Bolus: Normal Saline Bolus Amount (mL): 250 mL  Currently complaining of abdominal pain and discomfort, requesting pain medication.   Objective:  Vital signs: Blood pressure (!) 146/86, pulse 90, temperature 98.9 F (37.2 C), temperature source Oral, resp. rate 20, height 5\' 3"  (1.6 m), weight 101.3 kg, SpO2 98 %.  Intake/Output Summary (Last 24 hours) at 01/14/2022 1016 Last data filed at 01/13/2022 1300 Gross per 24 hour  Intake 120 ml  Output --  Net 120 ml    Filed Weights   01/13/22 0500 01/14/22 0207 01/14/22 1003  Weight: 102.7 kg 101.8 kg 101.3 kg     Physical Exam: General:  No acute distress  Head:  Normocephalic, atraumatic. Moist oral mucosal membranes  Eyes:  Anicteric  Lungs:   Clear to auscultation, normal effort  Heart:  S1S2 no rubs  Abdomen:   Soft, nontender, bowel sounds present  Extremities:  + peripheral edema.  Neurologic:  Awake, alert, following commands  Skin:  No lesions  Access: RT Permcath    Basic Metabolic Panel: Recent Labs  Lab 01/08/22 0941 01/09/22 0959 01/11/22 0625 01/12/22 1053 01/14/22 0406  NA 134* 137 135 134* 133*  K 4.9 4.7 3.6 3.8 3.7  CL 95* 100 96* 95* 94*  CO2 27 26 28 26 26   GLUCOSE 145* 87 99 124* 195*  BUN 128* 123* 60* 70* 58*  CREATININE 4.36* 4.73* 3.01* 5.02* 4.82*  CALCIUM 10.1 9.2 9.3 8.8* 9.3  PHOS 4.1 4.0  --  5.0* 4.0     CBC: Recent Labs  Lab 01/08/22 0941 01/09/22 0959 01/11/22 1752 01/12/22 1054  01/13/22 1117 01/14/22 0406  WBC 11.6* 11.1* 11.3* 13.7*  13.6*  --  15.9*  NEUTROABS  --   --   --  11.0*  --   --   HGB 8.4* 8.1* 7.9* 7.4*  7.4* 7.7* 7.8*  HCT 26.1* 25.7* 25.2* 23.5*  23.2* 24.0* 24.8*  MCV 93.9 93.1 93.7 94.0  92.4  --  93.9  PLT 445* 396 295 270  255  --  254      Urinalysis: No results for input(s): "COLORURINE", "LABSPEC", "PHURINE", "GLUCOSEU", "HGBUR", "BILIRUBINUR", "KETONESUR", "PROTEINUR", "UROBILINOGEN", "NITRITE", "LEUKOCYTESUR" in the last 72 hours.  Invalid input(s): "APPERANCEUR"    Imaging: [place] RT LOWER EXTREM LTD SOFT TISSUE NON VASCULAR  Result Date: 01/12/2022 CLINICAL DATA:  Right leg edema in thigh and calf EXAM: ULTRASOUND RIGHT LOWER EXTREMITY LIMITED TECHNIQUE: Ultrasound examination of the lower extremity soft tissues was performed in the area of clinical concern. COMPARISON:  None Available. FINDINGS: No focal mass or fluid collection.  Soft tissue edema noted. IMPRESSION: No mass or fluid collection. Electronically Signed   By: Rolm Baptise M.D.   On: 01/12/2022 19:20   US Venous Img Lower Bilateral (DVT)  Result Date: 01/12/2022 CLINICAL DATA:  Swelling EXAM: BILATERAL LOWER EXTREMITY VENOUS DOPPLER ULTRASOUND TECHNIQUE: Gray-scale sonography with compression, as well as color and duplex ultrasound, were performed to evaluate the deep venous system(s) from the level of the common femoral vein through the popliteal and proximal  calf veins. COMPARISON:  None Available. FINDINGS: VENOUS Normal compressibility of the common femoral, superficial femoral, and popliteal veins, as well as the visualized calf veins. Visualized portions of profunda femoral vein and great saphenous vein unremarkable. No filling defects to suggest DVT on grayscale or color Doppler imaging. Doppler waveforms show normal direction of venous flow, normal respiratory plasticity and response to augmentation. Limited views of the contralateral common femoral vein are  unremarkable. OTHER None. Limitations: Left distal femoral vein and calf veins not visualized due to edema. IMPRESSION: No evidence of lower extremity DVT with limitations in the left leg as above. Electronically Signed   By: Rolm Baptise M.D.   On: 01/12/2022 19:19   [place] LT LOWER EXTREM LTD SOFT TISSUE NON VASCULAR  Result Date: 01/12/2022 CLINICAL DATA:  Left leg swelling.  Evaluate thigh and calf EXAM: ULTRASOUND LEFT LOWER EXTREMITY LIMITED TECHNIQUE: Ultrasound examination of the lower extremity soft tissues was performed in the area of clinical concern. COMPARISON:  None Available. FINDINGS: No focal mass or fluid collection in the areas of concern in the left thigh and calf. Soft tissue edema. IMPRESSION: No focal mass or fluid collection. Electronically Signed   By: Rolm Baptise M.D.   On: 01/12/2022 19:18     Medications:    albumin human     meropenem (MERREM) IV 500 mg (01/13/22 1651)    (feeding supplement) PROSource Plus  30 mL Oral BID BM   vitamin C  500 mg Oral BID   atorvastatin  40 mg Oral Daily   Chlorhexidine Gluconate Cloth  6 each Topical Q0600   fluticasone  2 spray Each Nare Daily   furosemide  40 mg Oral Daily   gabapentin  100 mg Oral TID   heparin injection (subcutaneous)  5,000 Units Subcutaneous Q8H   insulin aspart  0-5 Units Subcutaneous QHS   insulin aspart  0-9 Units Subcutaneous TID WC   insulin glargine-yfgn  5 Units Subcutaneous Daily   leptospermum manuka honey  1 application  Topical Daily   loratadine  10 mg Oral Daily   metoprolol succinate  50 mg Oral BID   multivitamin  1 tablet Oral QHS   nutrition supplement (JUVEN)  1 packet Oral BID BM   oxyCODONE       zinc sulfate  220 mg Oral Daily    Assessment/ Plan:     Principal Problem:   Severe sepsis (HCC) Active Problems:   OSA (obstructive sleep apnea)   Chronic kidney disease, stage 5 ([place])   Essential hypertension   DNR (do not resuscitate)/DNI(Do Not Intubate)   Type 2 diabetes  mellitus with chronic kidney disease, with long-term current use of insulin (HCC)   Hypomagnesemia   Pressure injury of skin   Symptomatic anemia   Elevated LFTs   Sacral wound, initial encounter   Swelling of left lower extremity   Abdominal pain   AKI (acute kidney injury) ([place])   Hyponatremia   Premature atrial complexes   Anasarca associated with disorder of kidney   Fever  66 year old female with history of hypertension, diabetes, peripheral vascular disease, hyperlipidemia, now with sepsis and acute kidney injury on the top of chronic kidney disease.  Patient is reached end-stage renal disease and was started on renal replacement therapy.   #1: End-stage renal disease: Patient has a permacath placed.   Patient receiving dialysis, UF goal 0.5 to 1 L as tolerated as patient continues to urinate.  Lower extremity edema improved yet remains.  We will order albumin with dialysis today to improve fluid removal.  Continue furosemide 40 mg daily.  Next treatment scheduled for Friday.  Patient currently working with PT and OT for mobility needs.  Patient able to sit at side of bed with assistance for short time.  Nephrology goal for patient to tolerate sitting in chair for 4 hours to receive outpatient treatments.  Oral pain medication given at treatment initiation. Renal navigator currently seeking outpatient clinic chair.   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  Negative pressure wound VAC remains in place and managed by surgery. Possible biopsy for diagnosis planned today or tomorrow. Will begin sodium thiosulfate with dialysis treatments.    #3: Anemia secondary to chronic kidney disease complicated by sepsis. Has received blood transfusions during this admission.  Hemoglobin 7.8, below acceptable goal.  Will order EPO 10,000 units IV with dialysis treatments.   #4: Secondary hyperparathyroidism: Calcium and phosphorus are  within acceptable range for this patient.  We will continue to monitor.  #5: Acute metabolic acidosis: Resolved.     #6: Congestive heart failure: Daily furosemide 40 mg    LOS: [facility] 6/14/202310:16 AM

## 2022-01-14 NOTE — Progress Notes (Signed)
Occupational Therapy Treatment Patient Details Name: Theresa Warren MRN: [id number] DOB: September 01, 1955 Today's Date: 01/14/2022   History of present illness [name] is a 66 year old female admitted for evaluation of low sodium noted on labs at the facility.  Pt was discharged  to rehab on 5/23 after being admitted following a fall. PMH significant for acute on chronic anemia. hyperlipidemia, hypertension, CKD stage V, insulin-dependent diabetes mellitus   OT comments  Chart reviewed, RN cleared pt for participation in OT tx session. Pt is oriented to self, grossly oriented to place, date, and situation however appears tangential and with poor attention to task at hand. Pressure relieving  cushion brought into the room for eventual attempt to chair as pt appears with decreased cognition for appropriate weight shift as compared to previous tx sessions where pt was amb throughout room. Pt requires MOD A for supine>sit +2, MAX A for sit>supine +2 with sig increased time required. STS completed with MIN A with RW with significantly increased time, 5 steps up the bed with MIN A with RW with sig increased time. Pt able to sit on edge of bed for approx 5 minutes with good dynamic sitting balance. TOTAL A required for LB dressing. Increased assist required for all mobility tasks on this date as compared to treatment sessions provided a week ago. Pt provided significant education re: importance of pressure relief, position changes to facilitate wound healing. Will benefit from additional education. Pt is left as received, all needs met. OT will follow acutely.    Recommendations for follow up therapy are one component of a multi-disciplinary discharge planning process, led by the attending physician.  Recommendations may be updated based on patient status, additional functional criteria and insurance authorization.    Follow Up Recommendations  Skilled nursing-short term rehab (<3 hours/day)     Assistance Recommended at Discharge Frequent or constant Supervision/Assistance  Patient can return home with the following  A lot of help with bathing/dressing/bathroom;A little help with walking and/or transfers   Equipment Recommendations  BSC/3in1    Recommendations for Other Services      Precautions / Restrictions Precautions Precautions: Fall Precaution Comments: wound vac sacral area Restrictions Weight Bearing Restrictions: No       Mobility Bed Mobility Overal bed mobility: Needs Assistance Bed Mobility: Supine to Sit, Sit to Supine     Supine to sit: Mod assist, +2 for physical assistance, HOB elevated Sit to supine: Max assist, +2 for physical assistance, HOB elevated   General bed mobility comments: TOTAL A for boost in bed +2    Transfers Overall transfer level: Needs assistance Equipment used: Rolling walker (2 wheels) Transfers: Sit to/from Stand Sit to Stand: Min assist           General transfer comment: amb 5 steps to the left up the bed with MIN A with RW, step by step vcs, significantly increased time     Balance Overall balance assessment: Needs assistance Sitting-balance support: Feet supported, Bilateral upper extremity supported Sitting balance-Leahy Scale: Good Sitting balance - Comments: seated on edge of bed for approx 5 minutes without LOB, dynamic sitting peformed while pt scratching her back without LOB   Standing balance support: Bilateral upper extremity supported Standing balance-Leahy Scale: Fair                             ADL either performed or assessed with clinical judgement   ADL Overall ADL's :  Needs assistance/impaired                     Lower Body Dressing: Total assistance Lower Body Dressing Details (indicate cue type and reason): socks                    Extremity/Trunk Assessment              Vision       Perception     Praxis      Cognition Arousal/Alertness:  Awake/alert Behavior During Therapy: Flat affect Overall Cognitive Status: Impaired/Different from baseline Area of Impairment: Following commands, Safety/judgement, Attention, Awareness, Problem solving                   Current Attention Level: Focused   Following Commands: Follows one step commands with increased time Safety/Judgement: Decreased awareness of deficits, Decreased awareness of safety Awareness: Emergent Problem Solving: Requires verbal cues, Difficulty sequencing, Decreased initiation, Requires tactile cues, Slow processing          Exercises      Shoulder Instructions       General Comments      Pertinent Vitals/ Pain       Pain Assessment Pain Assessment: Faces Faces Pain Scale: Hurts even more Pain Location: generalized Pain Descriptors / Indicators: Crying, Discomfort, Guarding Pain Intervention(s): Limited activity within patient's tolerance, Monitored during session, Repositioned  Home Living                                          Prior Functioning/Environment              Frequency  Min 2X/week        Progress Toward Goals  OT Goals(current goals can now be found in the care plan section)  Progress towards OT goals: Progressing toward goals     Plan Discharge plan remains appropriate;Frequency remains appropriate    Co-evaluation                 AM-PAC OT "6 Clicks" Daily Activity     Outcome Measure   Help from another person eating meals?: A Little Help from another person taking care of personal grooming?: A Little Help from another person toileting, which includes using toliet, bedpan, or urinal?: A Lot Help from another person bathing (including washing, rinsing, drying)?: A Lot Help from another person to put on and taking off regular upper body clothing?: A Lot Help from another person to put on and taking off regular lower body clothing?: Total 6 Click Score: 13    End of Session  Equipment Utilized During Treatment: Rolling walker (2 wheels)  OT Visit Diagnosis: Other abnormalities of gait and mobility (R26.89);Muscle weakness (generalized) (M62.81)   Activity Tolerance Patient limited by lethargy   Patient Left in bed;with call bell/phone within reach;with bed alarm set   Nurse Communication Mobility status        Time: 4076-8088 OT Time Calculation (min): 38 min  Charges: OT General Charges $OT Visit: 1 Visit OT Treatments $Self Care/Home Management : 8-22 mins $Therapeutic Activity: 8-22 mins Shanon Payor, OTD OTR/L  01/14/22, 4:07 PM

## 2022-01-14 NOTE — Progress Notes (Signed)
Hemodialysis Post Treatment Note:  Tx date:01/14/2022 Tx time: 3 hours Access: Right CVC UF Removed: 1L  Note: HD treatment completed. Tolerated well. No HD complications.

## 2022-01-14 NOTE — Progress Notes (Signed)
OT Cancellation Note  Patient Details Name: Theresa Warren MRN: [id number] DOB: 1956/02/25   Cancelled Treatment:    Reason Eval/Treat Not Completed: Patient at procedure or test/ unavailable;Other (comment) (pt off floor at HD, OT will reattempt as able)  Shanon Payor, OTD OTR/L  01/14/22, 12:45 PM

## 2022-01-14 NOTE — Procedures (Addendum)
  Procedure Date:  01/14/2022  Pre-operative Diagnosis:  Possible calciphylaxis  Post-operative Diagnosis:  Possible calciphylaxis  Procedure:   Left inner thigh 4 mm skin punch excisional biopsy.  Left outer thigh 6 mm skin punch excisional biopsy.  Surgeon:  Melvyn Neth, MD  Assistant:  Edison Simon, PA-C  Anesthesia:  6 ml 1% lidocaine with epi  Estimated Blood Loss:  5 ml  Specimens:   Left inner thigh skin biopsy Left outer thigh skin biopsy  Complications:  None  Indications for Procedure:  This is a 66 y.o. female with possible diagnosis of calciphylaxis, requiring skin biopsy for diagnosis.  The risks of bleeding, abscess or infection, injury to surrounding structures, and need for further procedures were all discussed with the patient and was willing to proceed.  Description of Procedure: The patient was correctly identified at bedside.  Appropriate time-outs were performed prior to procedure.  The patient's left thigh was prepped and draped in usual sterile fashion.  Mr. Olean Ree infused Local anesthetic intradermally at the two predetermined sites based on the patient's wounds.  He then proceeded with the two biopsies.  First, a 6 mm skin punch biopsy was obtained in the lateral thigh and scalpel was used to free it from the deep subcutaneous tissue.  This was placed in formalin.  Next, a 4 mm skin punch biopsy was obtained in the inner thigh and scalpel was used to free it from the deep subcutaneous tissue.  This was also placed in formalin in a different container.  Manual pressure was applied to both wounds for hemostasis.  Afterwards, the wounds were dressed with dry gauze and tape.  The patient tolerated the procedure well and all sharps were appropriately disposed of at the end of the case.  I was present, immediately available and supervised Mr. Olean Ree during the entire procedure.    Melvyn Neth, MD

## 2022-01-14 NOTE — Consult Note (Signed)
[place] Nurse wound follow up Wound type:Unstageable pressure injuries over the bilateral buttocks; upper gluteal cleft and left buttock Multiple dark purple areas over the LEs and hips, to have punch bx of one of these areas today, unclear etiology on these. ID/general surgery following  Unstageable PI: left buttock; 10cm x 5cm x 2cm; 90% necrotic with periwound skin necrosis Unstageable PI: left upper gluteal cleft; 8cm x 6cm x 1.0cm; 50%  necrotic; otherwise pale  Unstageable PI: right upper gluteal cleft; 7cm x 9cm x 1.0cm; 80% necrotic, otherwise pale Measurement:see above  Wound bed:see above Drainage (amount, consistency, odor) moderate in VAC canister  Periwound: scattered yellow necrotic lesions aprox 8 counted, adherent yellow slough Dressing procedure/placement/frequency: Removed old NPWT dressing (3) pc of black foam and foam bridges  Periwound skin protected with skin barrier wipe; VAC drape used between left gluteal cleft wound and left buttock; bridge to the left hip; skin protected with VAC drape  Utilized ostomy barrier ring, cut into pieces in the skin folds, gluteal cleft, perianal area to aid in seal  Filled wound with  __3_ piece of black foam, and 2 pc used for bridge between wounds  Sealed NPWT dressing at 167mm HG Patient received IV pain medication per bedside nurse prior to dressing change Patient tolerated procedure well; but had pain management issues and discussed this with WOC while I was changing, she is concerned about pain management regimen.  I have communicated with general surgery, ID, and hospitalist about this   [place] nurse will continue to provide NPWT dressing changed due to the complexity of the dressing change.     [place], [place], [place]

## 2022-01-14 NOTE — Plan of Care (Signed)
  Problem: Fluid Volume: Goal: Hemodynamic stability will improve Outcome: Progressing   Problem: Respiratory: Goal: Ability to maintain adequate ventilation will improve Outcome: Progressing   Problem: Metabolic: Goal: Ability to maintain appropriate glucose levels will improve Outcome: Progressing   Problem: Nutritional: Goal: Maintenance of adequate nutrition will improve Outcome: Progressing

## 2022-01-14 NOTE — Progress Notes (Signed)
PT Cancellation Note  Patient Details Name: Theresa Warren MRN: [id number] DOB: Dec 11, 1955   Cancelled Treatment:    Reason Eval/Treat Not Completed: Other (comment). Pt out of room at this time, PT to attempt as able.   Lieutenant Diego PT, DPT 12:44 PM,01/14/22

## 2022-01-14 NOTE — Progress Notes (Signed)
[place] Hospital Day(s): [place] day(s): 5 Days Post-Op.   Interval History:  Patient seen and examined No acute events or new complaints overnight.  Patient reports she continues to have lots of pain in her buttock and legs; worse with dressing changes She continues to have worsening leukocytosis; 15.9K Hgb stable 7.8 BMP consistent with known ESRD; on HD (MWF) No significant electrolyte derangements    Vital signs in last 24 hours: [min-max] current  Temp:  [97.9 F (36.6 C)-99.3 F (37.4 C)] 97.9 F (36.6 C) (06/14 0853) Pulse Rate:  [68-79] 71 (06/14 0853) Resp:  [15-18] 15 (06/14 0837) BP: (120-139)/(46-59) 133/59 (06/14 0853) SpO2:  [94 %-98 %] 98 % (06/14 0853) Weight:  [101.8 kg] 101.8 kg (06/14 0207)     Height: 5\' 3"  (160 cm) Weight: 101.8 kg BMI (Calculated): 39.77   Intake/Output last 2 shifts:  06/13 0701 - 06/14 0700 In: 120 [P.O.:120] Out: -    Physical Exam:  Constitutional: alert, cooperative and no distress  Respiratory: breathing non-labored at rest  Cardiovascular: regular rate and sinus rhythm  Integumentary:   - Left Ischial Wound: 10cm x 5cm x 2cm, there is granulating tissue in the wound bed however there is a significant amount of necrotic tissue on the wound edges. I attempt to debride this with scissors but was unable do to the quality of the instruments (no scalpels on the floor storage), no appreciable abscess  - Bilateral Gluteal Wounds: Left measures 8cm x 6cm x 1.0cm and the right measures 7cm x 9cm x 1.0cm. There is some early granulation tissue but also has areas of further necrosis, no gross infection.   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   Left Ischial Wound (01/14/2022):   Bilateral Gluteal wounds (01/14/2022):    Left Anterior Hip Wound: (01/14/2022):     Labs:     Latest Ref Rng & Units  01/14/2022    4:06 AM 01/13/2022   11:17 AM 01/12/2022   10:54 AM  CBC  WBC 4.0 - 10.5 K/uL 15.9   13.6    13.7   Hemoglobin 12.0 - 15.0 g/dL 7.8  7.7  7.4    7.4   Hematocrit 36.0 - 46.0 % 24.8  24.0  23.2    23.5   Platelets 150 - 400 K/uL 254   255    270       Latest Ref Rng & Units 01/14/2022    4:06 AM 01/12/2022   10:53 AM 01/11/2022    6:25 AM  CMP  Glucose 70 - 99 mg/dL 195  124  99   BUN 8 - 23 mg/dL 58  70  60   Creatinine 0.44 - 1.00 mg/dL 4.82  5.02  3.01   Sodium 135 - 145 mmol/L 133  134  135   Potassium 3.5 - 5.1 mmol/L 3.7  3.8  3.6   Chloride 98 - 111 mmol/L 94  95  96   CO2 22 - 32 mmol/L 26  26  28    Calcium 8.9 - 10.3 mg/dL 9.3  8.8  9.3   Total Protein 6.5 - 8.1 g/dL  6.3    Total Bilirubin 0.3 - 1.2 mg/dL  0.9    Alkaline Phos 38 - 126 U/L  299    AST 15 - 41 U/L  262    ALT 0 - 44 U/L  156       Imaging studies: No new pertinent imaging studies   Assessment/Plan:  66 y.o. female with worsening leukocytosis and poor wound healing 13 days s/p debridement of three decubitus wounds of bilateral buttocks (total area 133 cm2) and placement of negative pressure dressing (> 50 cm).    - Unfortunately, her wound have continued to worsen now with more areas of necrotic tissue around the wound beds. This do not appear to have an overt active infection. Again, her immobility, physical deconditioning, poor nutritional status, and comorbidities are a massive barrier to wound healing. I am unsure these wounds will ever have a chance to heal given the above. I think it will be important to illicit goals of care and how extensive and aggressive she would like to be. Of course, further debridements are going to create even large wounds which will encounter the same issues. We will be happy to debride these again, if that is what the patient desires.   - We have also been asked to obtain a punch biopsy of the left anterior hip wound, which we will be happy to do. Timing to  be determined.   - Continue wound vac for now; MWF; will update WOC team if plans for return to OR   - Continue Abx; ID on board           - Local wound care; pressure offload, frequent repositioning, low air loss mattress (this should be reordered           - Pain control prn           - Further management per primary service; we will follow along  All of the above findings and recommendations were discussed with the patient, and the medical team, and all of patient's questions were answered to her expressed satisfaction.  -- Edison Simon, PA-C [facility] 01/14/2022, 9:43 AM M-F: 7am - 4pm

## 2022-01-14 NOTE — TOC Progression Note (Signed)
Transition of Care [facility]) - Progression Note    Patient Details  Name: Theresa Warren MRN: [id number] Date of Birth: 10/06/1955  Transition of Care [facility]) CM/SW [place], RN Phone Number: 01/14/2022, 9:19 AM  Clinical Narrative:     Met the patient at the bedside, She is not medically ready to go to [facility] SNF yet, I explained the 20 days free for STR and she was at [facility] from 5/23-5/31 and used 8 of the 20 days, she agrees to review bed offers when she is closer to being medically ready, she has 3 wound vacs, Surgey came in to see her and they will be doing a punch biopsy today as well  Expected Discharge Plan:  (TBD) Barriers to Discharge: Continued Medical Work up  Expected Discharge Plan and Services Expected Discharge Plan:  (TBD)   Discharge Planning Services: CM Consult Post Acute Care Choice:  (TBD) Living arrangements for the past 2 months: Single Family Home                                       Social Determinants of Health (SDOH) Interventions    Readmission Risk Interventions     No data to display

## 2022-01-14 NOTE — Progress Notes (Signed)
Progress Note   Patient: Theresa Warren KZS:[id number] DOB: Jul 01, 1956 DOA: 12/31/2021     13 DOS: the patient was seen and examined on 01/14/2022   Brief hospital course: [name] is a 66 year old female hyperlipidemia, hypertension, CKD stage V, insulin-dependent diabetes mellitus, who presents emergency department from [facility] for evaluation of low sodium noted on labs at the facility.  She was discharged to rehab on 5/23 after being admitted following a fall and also with acute on chronic anemia.  ED Course - temp 99.6, HR 107, otherwise unremarkable vitals. Labs with sodium 129, potassium 5.1, chloride 99, bicarb 17, BUN of 100, serum creatinine of 4.61, GFR of 10, WBC 18.4, hemoglobin 6.9, platelets of 473.  LFT's were also elevated.  Patient met criteria for sepsis on arrival, treated per protocol in the ED with IV Cefepime, Flagyl, Vanc, and LR 1 L bolus fluids.  Infection source unclear but felt possibly due to sacral wound infection and concern for underlying osteomyelitis.  Admitted to the hospitalist service for further evaluation and management.  Continued on IV Cefepime and Vanc.   General surgery was consulted for debridement on sacral pressure injury.  Nephrology consulted due to worsening renal function.   Weeks summary from prior provider. Patient was lethargic and displayed uremic symptoms.  She was started on hemodialysis after tunneled dialysis catheter was placed by vascular surgery on 6/9.  First dialysis was started on 6/9.  In the evening she was febrile and has been spiking fevers for the past few days.  She was placed on IV meropenem broad-spectrum and ID was consulted.  Also she has left thigh induration wounds soft tissue ultrasound was negative and she was also negative for bilateral DVTs.  6/14: 2 separate wound cultures, 1 with MSSA and Enterococcus faecalis and second with MSSA and Staph epidermidis resistant to methicillin.  Antibiotics  were switched to meropenem.  ID is on board.  Patient had multiple wound debridements. 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 Autoimmune and vasculitis labs pending.  CK low at 10, acute hepatitis panel negative. Patient also has postmenopausal vaginal bleeding resulted in blood loss anemia, endometrial thickening on scan which will required outpatient gynecologic evaluation to rule out malignancy.  So far received 2 unit of PRBC.   Assessment and Plan: * Severe sepsis ([place]) POA as evidenced by tachycardia, leukocytosis, AKI and transaminitis consistent with organ dysfunction and severe sepsis with suspected infection source being sacral wounds versus an abdominal source (CT abdomen pelvis negative for anything acute however).  MRSA PCR negative. Wound cultures grew few Staph aureus, rare Enterococcus faecalis. Initially treated with vancomycin/cefepime. Vanc stopped 6/4.  --Transition to p.o. Keflex to complete course, and then transition to meropenem as she became febrile again. -Repeat wound cultures with MSSA and staph epi which was resistant to methicillin. 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 -Skin biopsy was requested for general surgery -Sodium thiosulfate was given during dialysis today for concern of calciphylaxis -Continue with meropenem -Continue with wound care  Swelling of left lower extremity With redness, patient attributes it to clindamycin.  She reported on admission that this has been present for over one month.   Left lower extremity Doppler US on 12/18/21 was negative for DVT.  Monitor closely for now.  Sacral wound, initial encounter See above. Pictures in surgery note  Chronic kidney disease, stage 5 ([place]) Not transition to ESRD and patient was started on dialysis. -Continue with dialysis -  Appreciate nephrology help  AKI (acute kidney injury) ([place]) Please see above as patient is now on  dialysis.  Premature atrial complexes Patient became significantly tachycardic on 6/2 with heart rates sustaining in the 130s.  Cardiology reviewed telemetry and feels patient dysrhythmias were not A-fib but sinus tach with PACs. --Cardiology following -- Continue Lopressor 25 mg BID   Hypomagnesemia Resolved with replacement on 6/3. Monitor and replace Mg as needed.   Symptomatic anemia Hemoglobin on admission was 6.9 >> 6.3, down from 7.6 at time of discharge on 5/23.  No signs of active bleeding, did have vaginal/endometrial bleeding previous admission but no reports of this recently.  May have slow chronic blood loss from sacral wounds.  Has baseline anemia of chronic disease due to renal failure. Received 2 unit PRBC transfusion . -Continue to monitor -Transfuse if below 7  Abdominal pain Patient reported on admission.  CT of abdomen pelvis showed abnormal endometrial thickening for a postmenopausal patient, bladder distention estimated 520 mL, no other acute or inflammatory processes or acute findings. --Monitor clinically for now -- Needs OB/GYN follow-up for endometrial sampling to exclude endometrial carcinoma  Hyponatremia Resolved.  Presented with sodium 129.  This was noted on labs at [facility] and was the reason patient presented to the ED. improved with IV hydration. -Continue to monitor  Essential hypertension Blood pressure mildly elevated. -Continue with Lasix, metoprolol and hydralazine  Type 2 diabetes mellitus with chronic kidney disease, with long-term current use of insulin (HCC) CBG mildly elevated. -Continue Semglee -Continue with SSI  DNR (do not resuscitate)/DNI(Do Not Intubate) Admitting hospitalist confirmed DNR CODE STATUS with patient during admission encounter  Anasarca associated with disorder of kidney Nephrology following. Treated with Lasix IV, later Lasix drip, IV albumin.  Nephrology switched back to PO Lasix today.  Monitor I/O's & Daily  Wts  Elevated LFTs On admission, alk phos 212, AST 182, ALT 107.  Unclear etiology, repeat LFTs not available today.  Appears LFTs were elevated at time of discharge on 5/23 but have increased since. RUQ ultrasound unremarkable. Acute hepatitis panel negative. -- Follow CMP  Pressure injury of skin Present on admission.  Patient has multiple sacral pressure injuries.  Some concern at time of admission that these were the source of sepsis. General surgery consulted and took patient to the OR on 6/1 for debridement.   Now with wound VAC. --Initial wound VAC change Monday 6/5 then every M/W/F -- Wound care consulted -- Frequent repositioning --Follow general surgery recommendations --Pain control --Follow blood cultures Pressure Injury 01/01/22 Buttocks Left Stage 3 -  Full thickness tissue loss. Subcutaneous fat may be visible but bone, tendon or muscle are NOT exposed. (Active)  01/01/22 0222  Location: Buttocks  Location Orientation: Left  Staging: Stage 3 -  Full thickness tissue loss. Subcutaneous fat may be visible but bone, tendon or muscle are NOT exposed.  Wound Description (Comments):   Present on Admission: Yes     Pressure Injury 01/01/22 Buttocks Right Stage 2 -  Partial thickness loss of dermis presenting as a shallow open injury with a red, pink wound bed without slough. (Active)  01/01/22 0223  Location: Buttocks  Location Orientation: Right  Staging: Stage 2 -  Partial thickness loss of dermis presenting as a shallow open injury with a red, pink wound bed without slough.  Wound Description (Comments):   Present on Admission: Yes     Pressure Injury 01/01/22 Hip Left Stage 2 -  Partial thickness loss of dermis presenting  as a shallow open injury with a red, pink wound bed without slough. (Active)  01/01/22 0225  Location: Hip  Location Orientation: Left  Staging: Stage 2 -  Partial thickness loss of dermis presenting as a shallow open injury with a red, pink wound  bed without slough.  Wound Description (Comments):   Present on Admission: Yes     Pressure Injury 01/01/22 Hip Right Stage 3 -  Full thickness tissue loss. Subcutaneous fat may be visible but bone, tendon or muscle are NOT exposed. (Active)  01/01/22 0225  Location: Hip  Location Orientation: Right  Staging: Stage 3 -  Full thickness tissue loss. Subcutaneous fat may be visible but bone, tendon or muscle are NOT exposed.  Wound Description (Comments):   Present on Admission: Yes      OSA (obstructive sleep apnea) .  Appears not to be on CPAP        Subjective: Patient continued to experience quite a bit of pain around both buttock and thigh area.  She was seen during dialysis.  Physical Exam: Vitals:   01/14/22 1315 01/14/22 1329 01/14/22 1415 01/14/22 1708  BP: (!) 148/61  (!) 144/51 (!) 147/51  Pulse: 79  82 90  Resp: 20   18  Temp: 99.6 F (37.6 C)  99.5 F (37.5 C) 100.2 F (37.9 C)  TempSrc: Oral     SpO2: 98%  95% 95%  Weight:  99.8 kg    Height:       General.  Well-developed lady, in no acute distress. Pulmonary.  Lungs clear bilaterally, normal respiratory effort. CV.  Regular rate and rhythm, no JVD, rub or murmur. Abdomen.  Soft, nontender, nondistended, BS positive. CNS.  Alert and oriented .  No focal neurologic deficit. Extremities.  Multiple pressure injuries, multiple wound involving inner thigh there is some central necrosis and skin mottling.  2+ lower extremity edema. Psychiatry.  Judgment and insight appears normal.  Data Reviewed: Prior notes, labs and images reviewed  Family Communication:   Disposition: Status is: Inpatient Remains inpatient appropriate because: Severity of illness   Planned Discharge Destination: Skilled nursing facility  DVT prophylaxis.  Subcu heparin Time spent: 55 minutes  This record has been created using Systems analyst. Errors have been sought and corrected,but may not always be located.  Such creation errors do not reflect on the standard of care.  Author: Lorella Nimrod, MD 01/14/2022 5:47 PM  For on call review [email or web address].

## 2022-01-14 NOTE — Inpatient Diabetes Management (Signed)
Inpatient Diabetes Program Recommendations  AACE/ADA: New Consensus Statement on Inpatient Glycemic Control   Target Ranges:  Prepandial:   less than 140 mg/dL      Peak postprandial:   less than 180 mg/dL (1-2 hours)      Critically ill patients:  140 - 180 mg/dL    Latest Reference Range & Units 01/13/22 08:03 01/13/22 11:43 01/13/22 16:28 01/13/22 20:54 01/14/22 08:50  Glucose-Capillary 70 - 99 mg/dL 204 (H) 273 (H) 234 (H) 265 (H) 190 (H)   Review of Glycemic Control  Diabetes history: DM2 Outpatient Diabetes medications: Humulin R U500 30 unit mark on U100 syringe in the morning, Humulin R U500 25 unit mark on U100 syringe in the evening Current orders for Inpatient glycemic control: Semglee 5 units daily, Novolog 0-9 units TID with meals, Novolog 0-5 units QHS   Inpatient Diabetes Program Recommendations:    Insulin: Please consider increasing Semglee to 10 units daily. If post prandial glucose is consistently over 180 mg/dl, may need to consider ordering low dose meal coverage insulin if patient is eating at least 50% of meals.  Thanks, Barnie Alderman, RN, MSN, [place] Diabetes Coordinator Inpatient Diabetes Program [phone] (Team Pager from 8am to [place])

## 2022-01-15 DIAGNOSIS — A419 Sepsis, unspecified organism: Secondary | ICD-10-CM | POA: Diagnosis not present

## 2022-01-15 DIAGNOSIS — Z992 Dependence on renal dialysis: Secondary | ICD-10-CM | POA: Diagnosis not present

## 2022-01-15 DIAGNOSIS — N186 End stage renal disease: Secondary | ICD-10-CM | POA: Diagnosis not present

## 2022-01-15 DIAGNOSIS — R6520 Severe sepsis without septic shock: Secondary | ICD-10-CM | POA: Diagnosis not present

## 2022-01-15 LAB — CULTURE, BLOOD (ROUTINE X 2)
Culture: NO GROWTH
Culture: NO GROWTH
Special Requests: ADEQUATE

## 2022-01-15 LAB — ANCA PROFILE
Anti-MPO Antibodies: <0.2 units (ref 0.0–0.9)
Anti-PR3 Antibodies: <0.2 units (ref 0.0–0.9)
Atypical P-ANCA titer: <1:20 {titer}
C-ANCA: <1:20 {titer}
P-ANCA: <1:20 {titer}

## 2022-01-15 LAB — GLUCOSE, CAPILLARY
Glucose-Capillary: 151 mg/dL — ABNORMAL HIGH (ref 70–99)
Glucose-Capillary: 196 mg/dL — ABNORMAL HIGH (ref 70–99)
Glucose-Capillary: 261 mg/dL — ABNORMAL HIGH (ref 70–99)
Glucose-Capillary: 306 mg/dL — ABNORMAL HIGH (ref 70–99)

## 2022-01-15 NOTE — Progress Notes (Addendum)
Occupational Therapy Treatment Patient Details Name: Theresa Warren MRN: [id number] DOB: 28-Jun-1956 Today's Date: 01/15/2022   History of present illness Theresa Warren is a 66 year old female admitted for evaluation of low sodium noted on labs at the facility.  Pt was discharged  to rehab on 5/23 after being admitted following a fall. PMH significant for acute on chronic anemia. hyperlipidemia, hypertension, CKD stage V, insulin-dependent diabetes mellitus   OT comments  Pt greeted in bed requiring significant cueing for participation in all tasks on this date. Pt agreeable to mobility after multi model cueing and encouragement, education on importance of oob mobility. Co tx completed with PT. Re-eval also completed on this date. Anticipate pt requires MAX A for bathing tasks, MAX A for UB dressing, TOTAL A for LB dressing tasks at this time. Bed mobility completed with MOD A +2, STS with MIN A +2. Pt able to stand for approx 30 seconds then requires rest break. Goals are downgraded due to pt current level of functioning however pt did demo progress in activity tolerance from previous tx session to this treatment session today. Pt will continue to benefit from skilled OT to address functional deficits and to facilitate return to PLOF.   Also recommend pressure relieving, air filled cushion (such as a roho high profile with cushion cover) for dialysis chair due to pt wounds and the need to sit for an extended amount of time for HD treatment (up to 4 hrs): Pt has the following wounds and wound vac in this area per surgery note 4/16: - "Left Ischial Wound: 10cm x 5cm x 2cm, there is granulating tissue in the wound bed however there is a significant amount of necrotic tissue on the wound edges. Bilateral Gluteal Wounds: Left measures 8cm x 6cm x 1.0cm and the right measures 7cm x 9cm x 1.0cm. There is some early granulation tissue but also has areas of further necrosis, no gross infection.  Left  Anterior Hip Wound: Eschar to the anterior left hip, appearance overall concerning for calciphylaxis potentially"  Patient seat width 19", seat depth 23".       Recommendations for follow up therapy are one component of a multi-disciplinary discharge planning process, led by the attending physician.  Recommendations may be updated based on patient status, additional functional criteria and insurance authorization.    Follow Up Recommendations  Skilled nursing-short term rehab (<3 hours/day)    Assistance Recommended at Discharge Frequent or constant Supervision/Assistance  Patient can return home with the following  A lot of help with bathing/dressing/bathroom;A little help with walking and/or transfers   Equipment Recommendations  BSC/3in1    Recommendations for Other Services      Precautions / Restrictions Precautions Precautions: Fall Precaution Comments: wound vac sacral area Restrictions Weight Bearing Restrictions: No       Mobility Bed Mobility Overal bed mobility: Needs Assistance Bed Mobility: Rolling, Sidelying to Sit, Sit to Sidelying Rolling: Mod assist Sidelying to sit: Mod assist, +2 for safety/equipment     Sit to sidelying: Mod assist, +2 for safety/equipment      Transfers Overall transfer level: Needs assistance Equipment used: Rolling walker (2 wheels) Transfers: Sit to/from Stand Sit to Stand: Min assist, +2 safety/equipment           General transfer comment: two steps forward/backward, up the bed with MIN A with RW +2     Balance Overall balance assessment: Needs assistance Sitting-balance support: Feet supported, Bilateral upper extremity supported Sitting balance-Leahy Scale: Good  Standing balance support: Bilateral upper extremity supported Standing balance-Leahy Scale: Fair                             ADL either performed or assessed with clinical judgement   ADL Overall ADL's : Needs  assistance/impaired Eating/Feeding: Sitting;Set up                   Lower Body Dressing: Total assistance       Toileting- Clothing Manipulation and Hygiene: Maximal assistance;Sit to/from stand Toileting - Clothing Manipulation Details (indicate cue type and reason): anticipated            Extremity/Trunk Assessment Upper Extremity Assessment Upper Extremity Assessment: Generalized weakness   Lower Extremity Assessment Lower Extremity Assessment: Generalized weakness        Vision Baseline Vision/History: 1 Wears glasses Patient Visual Report: No change from baseline     Perception     Praxis      Cognition Arousal/Alertness: Awake/alert Behavior During Therapy: Flat affect Overall Cognitive Status: Impaired/Different from baseline Area of Impairment: Following commands, Safety/judgement, Attention, Awareness, Problem solving                   Current Attention Level: Focused   Following Commands: Follows one step commands with increased time Safety/Judgement: Decreased awareness of deficits, Decreased awareness of safety Awareness: Emergent Problem Solving: Requires verbal cues, Difficulty sequencing, Decreased initiation, Requires tactile cues, Slow processing          Exercises Other Exercises Other Exercises: edu re: importance of pressure relief/ oob mobility    Shoulder Instructions       General Comments wound vac in place at start/end of session    Pertinent Vitals/ Pain       Pain Assessment Pain Assessment: Faces Faces Pain Scale: Hurts even more Pain Location: LLE Pain Descriptors / Indicators: Crying, Discomfort, Guarding, Grimacing, Moaning Pain Intervention(s): Limited activity within patient's tolerance, Monitored during session, Repositioned  Home Living                                          Prior Functioning/Environment              Frequency  Min 2X/week        Progress Toward  Goals  OT Goals(current goals can now be found in the care plan section)  Progress towards OT goals: Progressing toward goals  Acute Rehab OT Goals Patient Stated Goal: go home OT Goal Formulation: With patient Time For Goal Achievement: 01/29/22 Potential to Achieve Goals: Fair ADL Goals Pt Will Perform Grooming: with min assist;standing;sitting Pt Will Perform Upper Body Dressing: with set-up Pt Will Perform Lower Body Dressing: with min assist;with adaptive equipment;sit to/from stand Pt Will Transfer to Toilet: with supervision;stand pivot transfer;bedside commode Pt Will Perform Toileting - Clothing Manipulation and hygiene: with min assist;sit to/from stand  Plan Discharge plan remains appropriate;Frequency remains appropriate    Co-evaluation    PT/OT/SLP Co-Evaluation/Treatment: Yes Reason for Co-Treatment: To address functional/ADL transfers;For patient/therapist safety PT goals addressed during session: Mobility/safety with mobility;Balance;Proper use of DME OT goals addressed during session: ADL's and self-care;Proper use of Adaptive equipment and DME      AM-PAC OT "6 Clicks" Daily Activity     Outcome Measure   Help from another person eating meals?: A Little Help from  another person taking care of personal grooming?: A Little Help from another person toileting, which includes using toliet, bedpan, or urinal?: A Lot Help from another person bathing (including washing, rinsing, drying)?: A Lot Help from another person to put on and taking off regular upper body clothing?: A Lot Help from another person to put on and taking off regular lower body clothing?: Total 6 Click Score: 13    End of Session Equipment Utilized During Treatment: Rolling walker (2 wheels);Gait belt  OT Visit Diagnosis: Other abnormalities of gait and mobility (R26.89);Muscle weakness (generalized) (M62.81)   Activity Tolerance Patient limited by pain   Patient Left in bed;with call  bell/phone within reach;with bed alarm set   Nurse Communication Mobility status        Time: 1450-1520 OT Time Calculation (min): 30 min  Charges: OT General Charges $OT Visit: 1 Visit OT Treatments $Self Care/Home Management : 8-22 mins $Therapeutic Activity: 8-22 mins Shanon Payor, OTD OTR/L  ATP 01/15/22, 4:08 PM

## 2022-01-15 NOTE — Progress Notes (Signed)
   01/15/22 1420  Clinical Encounter Type  Visited With Patient  Visit Type Initial;Social support   Chaplain Anay Walter f/u on attempt to visit w pt this a.m.  Chaplain B inquired about pt's needs and what would help her to feel supported. Pt shared her concerns over getting bills together and specifically in light of not being inclined to use digital comm or willingness to share personal data w/ friends.  Chaplain B validated pt's choices and shared that, although helping w bills is outside the role of a chaplain, as her chaplain it is part of our goal to help pt feel seen and heard, supported, and to seek ways to address pt needs.  Chaplain B put in a request for a companion sitter who may be able to assist in some social goals. Visit was interrupted by PT so Chaplain Karleen Seebeck made plan w pt to f/u on 6/16 to continue conversation.

## 2022-01-15 NOTE — Assessment & Plan Note (Signed)
Nephrology following. Treated with Lasix IV, later Lasix drip, IV albumin.  Nephrology switched back to PO Lasix today.  Monitor I/O's & Daily Wts

## 2022-01-15 NOTE — Progress Notes (Signed)
Progress Note   Patient: Theresa Warren IRJ:[id number] DOB: 27-Sep-1955 DOA: 12/31/2021     14 DOS: the patient was seen and examined on 01/15/2022   Brief hospital course: Theresa Warren is a 67 year old female hyperlipidemia, hypertension, CKD stage V, insulin-dependent diabetes mellitus, who presents emergency department from [facility] for evaluation of low sodium noted on labs at the facility.  She was discharged to rehab on 5/23 after being admitted following a fall and also with acute on chronic anemia.  ED Course - temp 99.6, HR 107, otherwise unremarkable vitals. Labs with sodium 129, potassium 5.1, chloride 99, bicarb 17, BUN of 100, serum creatinine of 4.61, GFR of 10, WBC 18.4, hemoglobin 6.9, platelets of 473.  LFT's were also elevated.  Patient met criteria for sepsis on arrival, treated per protocol in the ED with IV Cefepime, Flagyl, Vanc, and LR 1 L bolus fluids.  Infection source unclear but felt possibly due to sacral wound infection and concern for underlying osteomyelitis.  Admitted to the hospitalist service for further evaluation and management.  Continued on IV Cefepime and Vanc.   General surgery was consulted for debridement on sacral pressure injury.  Nephrology consulted due to worsening renal function.   Weeks summary from prior provider. Patient was lethargic and displayed uremic symptoms.  She was started on hemodialysis after tunneled dialysis catheter was placed by vascular surgery on 6/9.  First dialysis was started on 6/9.  In the evening she was febrile and has been spiking fevers for the past few days.  She was placed on IV meropenem broad-spectrum and ID was consulted.  Also she has left thigh induration wounds soft tissue ultrasound was negative and she was also negative for bilateral DVTs.  6/14: 2 separate wound cultures, 1 with MSSA and Enterococcus faecalis and second with MSSA and Staph epidermidis resistant to methicillin.  Antibiotics  were switched to meropenem.  ID is on board.  Patient had multiple wound debridements. 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 Autoimmune and vasculitis labs pending.  CK low at 10, acute hepatitis panel negative. Patient also has postmenopausal vaginal bleeding resulted in blood loss anemia, endometrial thickening on scan which will required outpatient gynecologic evaluation to rule out malignancy.  So far received 2 unit of PRBC.  6/15: Her wound continued to get worse.  Skin biopsy for concern of calciphylaxis was done by general surgery on 01/14/2022.  New pictures in ID note. ANA comprehensive profile was negative.  Rest of the autoimmune labs are pending.   Assessment and Plan: * Severe sepsis ([place]) POA as evidenced by tachycardia, leukocytosis, AKI and transaminitis consistent with organ dysfunction and severe sepsis with suspected infection source being sacral wounds versus an abdominal source (CT abdomen pelvis negative for anything acute however).  MRSA PCR negative. Wound cultures grew few Staph aureus, rare Enterococcus faecalis. Initially treated with vancomycin/cefepime. Vanc stopped 6/4.  --Transition to p.o. Keflex to complete course, and then transition to meropenem as she became febrile again. -Repeat wound cultures with MSSA and staph epi which was resistant to methicillin. 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 -Skin biopsy was done on 6/14 by general surgery -Sodium thiosulfate was given during dialysis today for concern of calciphylaxis -Continue with meropenem -Continue with wound care -Follow skin biopsy results  Swelling of left lower extremity With redness, patient attributes it to clindamycin.  She reported on admission that this has been present for over one month.  Left lower extremity Doppler US on 12/18/21 was negative for DVT.  Monitor closely for now.  Sacral wound, initial encounter See  above. Pictures in surgery note  Chronic kidney disease, stage 5 ([place]) Not transition to ESRD and patient was started on dialysis. -Continue with dialysis -Appreciate nephrology help  AKI (acute kidney injury) ([place]) Please see above as patient is now on dialysis.  Premature atrial complexes Patient became significantly tachycardic on 6/2 with heart rates sustaining in the 130s.  Cardiology reviewed telemetry and feels patient dysrhythmias were not A-fib but sinus tach with PACs. --Cardiology following -- Continue Lopressor 25 mg BID   Hypomagnesemia Resolved with replacement on 6/3. Monitor and replace Mg as needed.   Symptomatic anemia Hemoglobin on admission was 6.9 >> 6.3, down from 7.6 at time of discharge on 5/23.  No signs of active bleeding, did have vaginal/endometrial bleeding previous admission but no reports of this recently.  May have slow chronic blood loss from sacral wounds.  Has baseline anemia of chronic disease due to renal failure. Received 2 unit PRBC transfusion . -Continue to monitor -Transfuse if below 7  Abdominal pain Patient reported on admission.  CT of abdomen pelvis showed abnormal endometrial thickening for a postmenopausal patient, bladder distention estimated 520 mL, no other acute or inflammatory processes or acute findings. --Monitor clinically for now -- Needs OB/GYN follow-up for endometrial sampling to exclude endometrial carcinoma  Hyponatremia Resolved.  Presented with sodium 129.  This was noted on labs at [facility] and was the reason patient presented to the ED. improved with IV hydration. -Continue to monitor  Essential hypertension Blood pressure mildly elevated. -Continue with Lasix, metoprolol and hydralazine  Type 2 diabetes mellitus with chronic kidney disease, with long-term current use of insulin (HCC) CBG mildly elevated. 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 -Skin biopsy obtained   Anasarca associated with disorder of kidney Nephrology following. Treated with Lasix IV, later Lasix drip, IV albumin.  Nephrology switched back to PO Lasix today.  Monitor I/O's & Daily Wts  Elevated LFTs On admission, alk phos 212, AST 182, ALT 107.  Unclear etiology, repeat LFTs not available today.  Appears LFTs were elevated at time of discharge on 5/23 but have increased since. RUQ ultrasound unremarkable. Acute hepatitis panel negative. -- Follow CMP  Pressure injury of skin Present on admission.  Patient has multiple sacral pressure injuries.  Some concern at time of admission that these were the source of sepsis. General surgery consulted and took patient to the OR on 6/1 for debridement.   Now with wound VAC. --Initial wound VAC change Monday 6/5 then every M/W/F -- Wound care consulted -- Frequent repositioning --Follow general surgery recommendations --Pain control --Follow blood cultures Pressure Injury 01/01/22 Buttocks Left Stage 3 -  Full thickness tissue loss. Subcutaneous fat may be visible but bone, tendon or muscle are NOT exposed. (Active)  01/01/22 0222  Location: Buttocks  Location Orientation: Left  Staging: Stage 3 -  Full thickness tissue loss. Subcutaneous fat may be visible but bone, tendon or muscle are NOT exposed.  Wound Description (Comments):   Present on Admission: Yes     Pressure Injury 01/01/22 Buttocks Right Stage 2 -  Partial thickness loss of dermis presenting as a shallow open injury with a red, pink wound bed without slough. (Active)  01/01/22 0223  Location: Buttocks  Location Orientation: Right  Staging: Stage 2 -  Partial thickness loss of dermis presenting as a shallow open injury with a red, pink wound bed without slough.  Wound Description  (Comments):   Present on Admission: Yes     Pressure Injury 01/01/22 Hip Left Stage 2 -  Partial thickness loss of dermis presenting as a shallow open injury with a red, pink wound bed without slough. (Active)  01/01/22 0225  Location: Hip  Location Orientation: Left  Staging: Stage 2 -  Partial thickness loss of dermis presenting as a shallow open injury with a red, pink wound bed without slough.  Wound Description (Comments):   Present on Admission: Yes     Pressure Injury 01/01/22 Hip Right Stage 3 -  Full thickness tissue loss. Subcutaneous fat may be visible but bone, tendon or muscle are NOT exposed. (Active)  01/01/22 0225  Location: Hip  Location Orientation: Right  Staging: Stage 3 -  Full thickness tissue loss. Subcutaneous fat may be visible but bone, tendon or muscle are NOT exposed.  Wound Description (Comments):   Present on Admission: Yes      OSA (obstructive sleep apnea) .  Appears not to be on CPAP     Subjective: Patient continued to have significant pain in her buttocks.  She was very frustrated with her progress.  Continued to feel more tired and weak.  Physical Exam: Vitals:   01/15/22 0507 01/15/22 0527 01/15/22 0821 01/15/22 1203  BP: (!) 127/56  (!) 119/47 (!) 138/57  Pulse: 73  71 86  Resp: 18  (!) 21   Temp: 98.7 F (37.1 C)  98.6 F (37 C) 98.8 F (37.1 C)  TempSrc:      SpO2: 96%  99% 98%  Weight:  103.9 kg    Height:       General.  Well-developed lady, in no acute distress. Pulmonary.  Lungs clear bilaterally, normal respiratory effort. CV.  Regular rate and rhythm, no JVD, rub or murmur. Abdomen.  Soft, nontender, nondistended, BS positive. CNS.  Alert and oriented .  No focal neurologic deficit. Extremities.  2+ lower extremity edema, multiple wounds involving buttock, hip and thighs. Psychiatry.  Judgment and insight appears normal.  Data Reviewed: Prior notes, labs and images reviewed  Family Communication: Tried calling the  cousin who lives in [place] at her request with no response.  Disposition: Status is: Inpatient Remains inpatient appropriate because: Severity of illness   Planned Discharge Destination: Skilled nursing facility  DVT prophylaxis.  Subcu heparin Time spent: 50 minutes  This record has been created using Systems analyst. Errors have been sought and corrected,but may not always be located. Such creation errors do not reflect on the standard of care.  Author: Lorella Nimrod, MD 01/15/2022 3:55 PM  For on call review [email or web address].

## 2022-01-15 NOTE — Assessment & Plan Note (Signed)
See above. Pictures in surgery note

## 2022-01-15 NOTE — Progress Notes (Addendum)
ID Pt in sever epain Not at all comfortable Skin biopsy was done yesterday  O/e in distress Awake Patient Vitals for the past 24 hrs:  BP Temp Pulse Resp SpO2 Weight  01/15/22 2115 (!) 150/54 99.5 F (37.5 C) (!) 108 18 92 % --  01/15/22 1630 (!) 161/58 99.3 F (37.4 C) (!) 101 18 92 % --  01/15/22 1203 (!) 138/57 98.8 F (37.1 C) 86 -- 98 % --  01/15/22 0821 (!) 119/47 98.6 F (37 C) 71 (!) 21 99 % --  01/15/22 0527 -- -- -- -- -- 103.9 kg  01/15/22 0507 (!) 127/56 98.7 F (37.1 C) 73 18 96 % --   Chest b/l air entry Hs- Tachycardia Rash today is much better- so looks like autonomic response CNS non focal Skin- multiple areas of indurated skin/soft tissue over the lower extremities        B/l buttock wounds covered with dressing   Labs    Latest Ref Rng & Units 01/14/2022    4:06 AM 01/13/2022   11:17 AM 01/12/2022   10:54 AM  CBC  WBC 4.0 - 10.5 K/uL 15.9   13.6    13.7   Hemoglobin 12.0 - 15.0 g/dL 7.8  7.7  7.4    7.4   Hematocrit 36.0 - 46.0 % 24.8  24.0  23.2    23.5   Platelets 150 - 400 K/uL 254   255    270        Latest Ref Rng & Units 01/14/2022    4:06 AM 01/12/2022   10:53 AM 01/11/2022    6:25 AM  CMP  Glucose 70 - 99 mg/dL 195  124  99   BUN 8 - 23 mg/dL 58  70  60   Creatinine 0.44 - 1.00 mg/dL 4.82  5.02  3.01   Sodium 135 - 145 mmol/L 133  134  135   Potassium 3.5 - 5.1 mmol/L 3.7  3.8  3.6   Chloride 98 - 111 mmol/L 94  95  96   CO2 22 - 32 mmol/L 26  26  28    Calcium 8.9 - 10.3 mg/dL 9.3  8.8  9.3   Total Protein 6.5 - 8.1 g/dL  6.3    Total Bilirubin 0.3 - 1.2 mg/dL  0.9    Alkaline Phos 38 - 126 U/L  299    AST 15 - 41 U/L  262    ALT 0 - 44 U/L  156     6/11 BC NG 6/10 BC NG  ANA profile  neg ANCA neg Antiphospholipid pending  Gluteal wound culture- staph aureus and enterococcus  Impression/recommendation B/l buttock wounds- calciphylaxis VS pressure- favor the former Secondarily infected- s/p debridement- continue  meropenem  Multiple indurated lesions over the lower extremities D.D calciphylaxis\ Vasculitis  Lupus  APL abs ?? paraneoplastic  Biopsy done ANA/ANCA neg   Fever- likely due to skin lesions  Transaminitis ? Cause  Skin rash- waxes and wanes- doubt drug related ? Autonomic   Vaginal bleed and endometrial stripe thickening- recommend GYN for endometrial biopsy to r/o malignancy  [facility] available by phone on Friday and weekend- call if needed

## 2022-01-15 NOTE — Progress Notes (Signed)
[facility]  PROGRESS NOTE   Subjective:   Patient alert and appears oriented Tolerating meals Pain controlled with prescribed mediations States she is able to sit in a chair for a couple hours and has done this during this admission.    Objective:  Vital signs: Blood pressure (!) 119/47, pulse 71, temperature 98.6 F (37 C), resp. rate (!) 21, height 5\' 3"  (1.6 m), weight 103.9 kg, SpO2 99 %.  Intake/Output Summary (Last 24 hours) at 01/15/2022 1116 Last data filed at 01/15/2022 1022 Gross per 24 hour  Intake 420 ml  Output 1000 ml  Net -580 ml    Filed Weights   01/14/22 1003 01/14/22 1329 01/15/22 0527  Weight: 101.3 kg 99.8 kg 103.9 kg     Physical Exam: General:  No acute distress  Head:  Normocephalic, atraumatic. Moist oral mucosal membranes  Eyes:  Anicteric  Lungs:   Clear to auscultation, normal effort  Heart:  S1S2 no rubs  Abdomen:   Soft, nontender, bowel sounds present  Extremities:  + peripheral edema.  Neurologic:  Awake, alert, following commands  Skin:  No lesions  Access: RT Permcath    Basic Metabolic Panel: Recent Labs  Lab 01/09/22 0959 01/11/22 0625 01/12/22 1053 01/14/22 0406  NA 137 135 134* 133*  K 4.7 3.6 3.8 3.7  CL 100 96* 95* 94*  CO2 26 28 26 26   GLUCOSE 87 99 124* 195*  BUN 123* 60* 70* 58*  CREATININE 4.73* 3.01* 5.02* 4.82*  CALCIUM 9.2 9.3 8.8* 9.3  PHOS 4.0  --  5.0* 4.0     CBC: Recent Labs  Lab 01/09/22 0959 01/11/22 1752 01/12/22 1054 01/13/22 1117 01/14/22 0406  WBC 11.1* 11.3* 13.7*  13.6*  --  15.9*  NEUTROABS  --   --  11.0*  --   --   HGB 8.1* 7.9* 7.4*  7.4* 7.7* 7.8*  HCT 25.7* 25.2* 23.5*  23.2* 24.0* 24.8*  MCV 93.1 93.7 94.0  92.4  --  93.9  PLT 396 295 270  255  --  254      Urinalysis: No results for input(s): "COLORURINE", "LABSPEC", "PHURINE", "GLUCOSEU", "HGBUR", "BILIRUBINUR", "KETONESUR", "PROTEINUR", "UROBILINOGEN", "NITRITE", "LEUKOCYTESUR" in the last 72  hours.  Invalid input(s): "APPERANCEUR"    Imaging: No results found.   Medications:    meropenem (MERREM) IV 500 mg (01/14/22 1717)   [START ON 01/16/2022] sodium thiosulfate 25 g in sodium chloride 0.9 % 200 mL Infusion for Calciphylaxis      (feeding supplement) PROSource Plus  30 mL Oral TID BM   vitamin C  500 mg Oral BID   atorvastatin  40 mg Oral Daily   Chlorhexidine Gluconate Cloth  6 each Topical Q0600   epoetin (EPOGEN/PROCRIT) injection  10,000 Units Intravenous Q M,W,F-HD   fluticasone  2 spray Each Nare Daily   furosemide  40 mg Oral Daily   gabapentin  100 mg Oral TID   heparin injection (subcutaneous)  5,000 Units Subcutaneous Q8H   insulin aspart  0-5 Units Subcutaneous QHS   insulin aspart  0-9 Units Subcutaneous TID WC   insulin glargine-yfgn  5 Units Subcutaneous Daily   leptospermum manuka honey  1 application  Topical Daily   lidocaine-EPINEPHrine  20 mL Intradermal Once   loratadine  10 mg Oral Daily   metoprolol succinate  50 mg Oral BID   multivitamin  1 tablet Oral QHS   nutrition supplement (JUVEN)  1 packet Oral BID BM  zinc sulfate  220 mg Oral Daily    Assessment/ Plan:     Principal Problem:   Severe sepsis ([place]) Active Problems:   OSA (obstructive sleep apnea)   Chronic kidney disease, stage 5 (HCC)   Essential hypertension   DNR (do not resuscitate)/DNI(Do Not Intubate)   Type 2 diabetes mellitus with chronic kidney disease, with long-term current use of insulin (HCC)   Hypomagnesemia   Pressure injury of skin   Symptomatic anemia   Elevated LFTs   Sacral wound, initial encounter   Swelling of left lower extremity   Abdominal pain   AKI (acute kidney injury) ([place])   Hyponatremia   Premature atrial complexes   Anasarca associated with disorder of kidney   Calciphylaxis  66 year old female with history of hypertension, diabetes, peripheral vascular disease, hyperlipidemia, now with sepsis and acute kidney injury on the top of  chronic kidney disease.  Patient is reached end-stage renal disease and was started on renal replacement therapy.   #1: End-stage renal disease: Patient has a permacath placed.   Received dialysis yesterday, UF goal 1L achieved. Next treatment scheduled for Friday. Will order albumin with treatment based on labs to aid in fluid removal.  Renal navigator currently seeking outpatient clinic chair.   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  Negative pressure wound VAC remains in place and managed by surgery.  Punch biopsy performed yesterday.  Will begin sodium thiosulfate with dialysis treatments.    #3: Anemia secondary to chronic kidney disease complicated by sepsis. Has received blood transfusions during this admission.  Hemoglobin below desired goal.  EPO with dialysis treatments.   #4: Secondary hyperparathyroidism: Calcium and phosphorus are within acceptable range for this patient.    #5: Acute metabolic acidosis: Resolved.     #6: Congestive heart failure: Echo from 01/03/22 show mild LVH at basal septal segment. Daily furosemide 40 mg    LOS: [facility] 6/15/202311:16 AM

## 2022-01-15 NOTE — Assessment & Plan Note (Signed)
POA as evidenced by tachycardia, leukocytosis, AKI and transaminitis consistent with organ dysfunction and severe sepsis with suspected infection source being sacral wounds versus an abdominal source (CT abdomen pelvis negative for anything acute however).  MRSA PCR negative. Wound cultures grew few Staph aureus, rare Enterococcus faecalis. Initially treated with vancomycin/cefepime. Vanc stopped 6/4.  --Transition to p.o. Keflex to complete course, and then transition to meropenem as she became febrile again. -Repeat wound cultures with MSSA and staph epi which was resistant to methicillin. 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 -Skin biopsy was done on 6/14 by general surgery -Sodium thiosulfate was given during dialysis today for concern of calciphylaxis -Continue with meropenem -Continue with wound care -Follow skin biopsy results

## 2022-01-15 NOTE — Progress Notes (Signed)
   01/15/22 1210  Clinical Encounter Type  Visited With Patient not available;Health care provider  Visit Type Initial   Chaplain Rosalynn Sergent attempted to f/u with pt following conversation with Velna Hatchet, RN earlier.  Chaplain B put in a request for a companion sitter who may be able to spend time with pt looking at her bills, since this was the primary need communicated.  This chaplain will attempt to f/u later (pt was on phone) to directly assess needs and plan ways to help pt feel supported.

## 2022-01-15 NOTE — Progress Notes (Signed)
Physical Therapy Treatment Patient Details Name: [name] MRN: [id number] DOB: 01-22-1956 Today's Date: 01/15/2022   History of Present Illness [name] is a 66 year old female admitted for evaluation of low sodium noted on labs at the facility.  Pt was discharged  to rehab on 5/23 after being admitted following a fall. PMH significant for acute on chronic anemia. hyperlipidemia, hypertension, CKD stage V, insulin-dependent diabetes mellitus    PT Comments    Patient alert, emotional throughout session and often expressed her frustration. Adamantly refusing to mobilize with PT, but with OT entering room for co-treat, agreeable with maximum education and encouragement. The patient was instructed in the log roll technique with modA (2+ for safety) and use of bed rails. Sidelying to sit with modA as well, pt able to initiate and use BUE. She was able to sit 7-9 minutes in total, and then stand with minA with RW, 2+ for safety. With encouragement she was able to step to the R, forwards/backwards and had good eccentric control when returning to sitting. Log roll to return to bed, also modA mostly for BLE assist. Repositioned in bed with pillows under right hip at end of session. The patient would benefit from further skilled PT intervention to continue to progress towards goals. Recommendation remains appropriate.      Recommendations for follow up therapy are one component of a multi-disciplinary discharge planning process, led by the attending physician.  Recommendations may be updated based on patient status, additional functional criteria and insurance authorization.  Follow Up Recommendations  Skilled nursing-short term rehab (<3 hours/day)     Assistance Recommended at Discharge Frequent or constant Supervision/Assistance  Patient can return home with the following A lot of help with bathing/dressing/bathroom;Assistance with cooking/housework;Help with stairs or ramp for  entrance;Assist for transportation;A lot of help with walking and/or transfers;Direct supervision/assist for medications management   Equipment Recommendations  Rolling walker (2 wheels);BSC/3in1;Wheelchair cushion (measurements PT);Wheelchair (measurements PT)    Recommendations for Other Services       Precautions / Restrictions Precautions Precautions: Fall Precaution Comments: wound vac sacral area Restrictions Weight Bearing Restrictions: No     Mobility  Bed Mobility Overal bed mobility: Needs Assistance Bed Mobility: Rolling, Sidelying to Sit, Sit to Sidelying Rolling: Mod assist Sidelying to sit: Mod assist, +2 for safety/equipment     Sit to sidelying: Mod assist, +2 for safety/equipment General bed mobility comments: TOTAL A for boost in bed +2    Transfers Overall transfer level: Needs assistance Equipment used: Rolling walker (2 wheels) Transfers: Sit to/from Stand Sit to Stand: Min assist, +2 safety/equipment           General transfer comment: able to take two steps forwards/backwards, 1 step to the right, declined stepping forward again but takes a few more steps backwards to put legs against the bed    Ambulation/Gait Ambulation/Gait assistance: Min guard, +2 safety/equipment Gait Distance (Feet): 4 Feet Assistive device: Rolling walker (2 wheels)         General Gait Details: able to take two steps forwards/backwards, 1 step to the right, declined stepping forward again but takes a few more steps backwards to put legs against the bed   Stairs             Wheelchair Mobility    Modified Rankin (Stroke Patients Only)       Balance Overall balance assessment: Needs assistance Sitting-balance support: Feet supported, Bilateral upper extremity supported Sitting balance-Leahy Scale: Good  Standing balance support: Bilateral upper extremity supported Standing balance-Leahy Scale: Fair                               Cognition Arousal/Alertness: Awake/alert Behavior During Therapy: Flat affect Overall Cognitive Status: Impaired/Different from baseline Area of Impairment: Following commands, Safety/judgement, Attention, Awareness, Problem solving                   Current Attention Level: Focused   Following Commands: Follows one step commands with increased time Safety/Judgement: Decreased awareness of deficits, Decreased awareness of safety Awareness: Emergent Problem Solving: Requires verbal cues, Difficulty sequencing, Decreased initiation, Requires tactile cues, Slow processing General Comments: some emotional lability noted; varied from frustrated to joking with PT/OT        Exercises      General Comments General comments (skin integrity, edema, etc.): wound vac in place at start/end of session      Pertinent Vitals/Pain Pain Assessment Pain Assessment: Faces Faces Pain Scale: Hurts even more Pain Location: generalized Pain Descriptors / Indicators: Crying, Discomfort, Guarding, Grimacing, Moaning Pain Intervention(s): Limited activity within patient's tolerance, Monitored during session, Repositioned    Home Living                          Prior Function            PT Goals (current goals can now be found in the care plan section) Progress towards PT goals: Progressing toward goals    Frequency    Min 2X/week      PT Plan Current plan remains appropriate    Co-evaluation PT/OT/SLP Co-Evaluation/Treatment: Yes Reason for Co-Treatment: To address functional/ADL transfers;For patient/therapist safety PT goals addressed during session: Mobility/safety with mobility;Balance;Proper use of DME OT goals addressed during session: ADL's and self-care      AM-PAC PT "6 Clicks" Mobility   Outcome Measure  Help needed turning from your back to your side while in a flat bed without using bedrails?: A Lot Help needed moving from lying on your back to  sitting on the side of a flat bed without using bedrails?: A Lot Help needed moving to and from a bed to a chair (including a wheelchair)?: A Lot Help needed standing up from a chair using your arms (e.g., wheelchair or bedside chair)?: A Lot Help needed to walk in hospital room?: A Lot Help needed climbing 3-5 steps with a railing? : Total 6 Click Score: 11    End of Session Equipment Utilized During Treatment: Gait belt Activity Tolerance: Patient limited by pain Patient left: in bed;with call bell/phone within reach;with bed alarm set Nurse Communication: Mobility status PT Visit Diagnosis: Unsteadiness on feet (R26.81);Muscle weakness (generalized) (M62.81);Difficulty in walking, not elsewhere classified (R26.2)     Time: 4332-9518 PT Time Calculation (min) (ACUTE ONLY): 38 min  Charges:  $Therapeutic Activity: 8-22 mins                     Lieutenant Diego PT, DPT 3:44 PM,01/15/22

## 2022-01-15 NOTE — Assessment & Plan Note (Signed)
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 -Skin biopsy obtained

## 2022-01-15 NOTE — Assessment & Plan Note (Signed)
Resolved.  Presented with sodium 129.  This was noted on labs at [facility] and was the reason patient presented to the ED. improved with IV hydration. -Continue to monitor

## 2022-01-16 DIAGNOSIS — L89313 Pressure ulcer of right buttock, stage 3: Secondary | ICD-10-CM | POA: Diagnosis not present

## 2022-01-16 DIAGNOSIS — L89323 Pressure ulcer of left buttock, stage 3: Secondary | ICD-10-CM | POA: Diagnosis not present

## 2022-01-16 DIAGNOSIS — A419 Sepsis, unspecified organism: Secondary | ICD-10-CM | POA: Diagnosis not present

## 2022-01-16 DIAGNOSIS — R6520 Severe sepsis without septic shock: Secondary | ICD-10-CM

## 2022-01-16 LAB — CBC
HCT: 22.2 % — ABNORMAL LOW (ref 36.0–46.0)
Hemoglobin: 7 g/dL — ABNORMAL LOW (ref 12.0–15.0)
MCH: 29 pg (ref 26.0–34.0)
MCHC: 31.5 g/dL (ref 30.0–36.0)
MCV: 92.1 fL (ref 80.0–100.0)
Platelets: 253 10*3/uL (ref 150–400)
RBC: 2.41 MIL/uL — ABNORMAL LOW (ref 3.87–5.11)
RDW: 14.6 % (ref 11.5–15.5)
WBC: 11.1 10*3/uL — ABNORMAL HIGH (ref 4.0–10.5)
nRBC: 0 % (ref 0.0–0.2)

## 2022-01-16 LAB — RENAL FUNCTION PANEL
Albumin: 2.3 g/dL — ABNORMAL LOW (ref 3.5–5.0)
Anion gap: 12 (ref 5–15)
BUN: 74 mg/dL — ABNORMAL HIGH (ref 8–23)
CO2: 25 mmol/L (ref 22–32)
Calcium: 9.4 mg/dL (ref 8.9–10.3)
Chloride: 96 mmol/L — ABNORMAL LOW (ref 98–111)
Creatinine, Ser: 4.89 mg/dL — ABNORMAL HIGH (ref 0.44–1.00)
GFR, Estimated: 9 mL/min — ABNORMAL LOW (ref >60)
Glucose, Bld: 266 mg/dL — ABNORMAL HIGH (ref 70–99)
Phosphorus: 3.9 mg/dL (ref 2.5–4.6)
Potassium: 3.5 mmol/L (ref 3.5–5.1)
Sodium: 133 mmol/L — ABNORMAL LOW (ref 135–145)

## 2022-01-16 LAB — HEXAGONAL PHASE PHOSPHOLIPID: Hexagonal Phase Phospholipid: 6 s (ref 0–11)

## 2022-01-16 LAB — DRVVT MIX: dRVVT Mix: 41.2 s — ABNORMAL HIGH (ref 0.0–40.4)

## 2022-01-16 LAB — GLUCOSE, CAPILLARY
Glucose-Capillary: 248 mg/dL — ABNORMAL HIGH (ref 70–99)
Glucose-Capillary: 181 mg/dL — ABNORMAL HIGH (ref 70–99)
Glucose-Capillary: 196 mg/dL — ABNORMAL HIGH (ref 70–99)

## 2022-01-16 LAB — ANTIPHOSPHOLIPID SYNDROME PROF
Anticardiolipin IgG: <9 GPL U/mL (ref 0–14)
Anticardiolipin IgM: <9 MPL U/mL (ref 0–12)
DRVVT: 48.8 s — ABNORMAL HIGH (ref 0.0–47.0)
PTT Lupus Anticoagulant: 57.7 s — ABNORMAL HIGH (ref 0.0–43.5)

## 2022-01-16 LAB — PTT-LA MIX: PTT-LA Mix: 53.5 s — ABNORMAL HIGH (ref 0.0–40.5)

## 2022-01-16 LAB — CULTURE, BLOOD (ROUTINE X 2)
Culture: NO GROWTH
Culture: NO GROWTH

## 2022-01-16 LAB — DRVVT CONFIRM: dRVVT Confirm: 0.9 ratio (ref 0.8–1.2)

## 2022-01-16 LAB — PARATHYROID HORMONE, INTACT (NO CA): PTH: 23 pg/mL (ref 15–65)

## 2022-01-16 LAB — ANA W/REFLEX IF POSITIVE: Anti Nuclear Antibody (ANA): NEGATIVE

## 2022-01-16 MED ORDER — INSULIN ASPART 100 UNIT/ML IJ SOLN
3.0000 [IU] | Freq: Three times a day (TID) | INTRAMUSCULAR | Status: DC
  Administered 2022-01-16 – 2022-01-18 (×5): 3 [IU] via SUBCUTANEOUS
  Filled 2022-01-16 (×3): qty 1

## 2022-01-16 MED ORDER — INSULIN GLARGINE-YFGN 100 UNIT/ML ~~LOC~~ SOLN
5.0000 [IU] | Freq: Two times a day (BID) | SUBCUTANEOUS | Status: DC
  Administered 2022-01-16 – 2022-01-17 (×3): 5 [IU] via SUBCUTANEOUS
  Filled 2022-01-16 (×4): qty 0.05

## 2022-01-16 MED ORDER — EPOETIN ALFA 10000 UNIT/ML IJ SOLN
INTRAMUSCULAR | Status: AC
  Filled 2022-01-16: qty 1

## 2022-01-16 NOTE — Progress Notes (Signed)
Patient identified as a  new dialysis start.  Visited patient and left education materials in room.  Patient listened and had no questions.  Patient stated she was tired and fell asleep during visit.  I spoke to the the renal coordinator in dialysis and asked her to let me know if patient had any further questions.

## 2022-01-16 NOTE — Consult Note (Addendum)
[place] Nurse wound follow up Surgical PA and Dr Dahlia Byes at the bedside to assess wound appearance and discuss plan of care.  Wounds have continued to decline, with greater then 25% of slough to inner wounds and all are surrounded by additional slough and eschar to wound edges.  Pt was medicated for pain prior to the procedure and it was still very painful and she was crying.  Surgical team has determined the best plan of action is to continue Vac therapy to decrease the pain and frequency of dressing changes.  Pt was incontinent a large amt loose stool; it will be difficult to maintain a seal to the Vac dressing if this continues to occur, related to the close proximity of the wounds to her rectum nearby.   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 Biopsy to r/o calciphalaxis is pending. Drainage (amount, consistency, odor) moderate amt pink drainage in VAC canister  Periwound: scattered yellow necrotic lesions aprox 8 counted, adherent yellow slough Dressing procedure/placement/frequency: Removed old NPWT dressing (3) pc of black foam and foam bridges  Periwound skin protected with skin barrier wipe; VAC drape used between left gluteal cleft wound and left buttock; bridge to the left hip; skin protected with VAC drape  Utilized ostomy barrier rings, cut into pieces in the skin folds, gluteal cleft, perianal area to aid in seal  Filled wound with  __3_ piece of black foam, and 3 pc used for bridge between wounds  Sealed NPWT dressing at 177mm HG   WOC team will continue to provide NPWT dressing changes Q M/W/F. Julien Girt MSN, RN, [place], [place], [place]

## 2022-01-16 NOTE — Progress Notes (Signed)
Patient has has been accepted at [place] 12:15pm. Referral was originally sent to [facility], however they are at capacity and referral was rerouted to next closest clinic. At present patient is not medically ready for discharge and plan for placement is undecided. If patient requires SNF placement, clinic may need to be switched.

## 2022-01-16 NOTE — Assessment & Plan Note (Signed)
CBG remained elevated above 200 -Increase Semglee to 5 units twice daily -Add 3 units with meals -Continue with SSI

## 2022-01-16 NOTE — Progress Notes (Signed)
PT Cancellation Note  Patient Details Name: Theresa Warren MRN: [id number] DOB: 1955-10-01   Cancelled Treatment:    Reason Eval/Treat Not Completed: Other (comment). Pt out of room at this time, PT to re-attempt as able.    Lieutenant Diego PT, DPT 11:56 AM,01/16/22

## 2022-01-16 NOTE — TOC Progression Note (Signed)
Transition of Care [facility]) - Progression Note    Patient Details  Name: Theresa Warren MRN: [id number] Date of Birth: [birth year]  Transition of Care [facility]) CM/SW [place], RN Phone Number: 01/16/2022, 8:22 AM  Clinical Narrative:    TOC continues to follow the patient for needs She continues to not be medically ready for Discharge and continues to undergo treatment, Biopsy completed and pending results, Will review the bed offers once the patient is more medically ready to Discharge, The patient requested that we reach out to her cousin in [place], however the number provided is not a working number [employer] [phone], Will attempt to get a different number to be able to reach out.    Expected Discharge Plan:  (TBD) Barriers to Discharge: Continued Medical Work up  Expected Discharge Plan and Services Expected Discharge Plan:  (TBD)   Discharge Planning Services: CM Consult Post Acute Care Choice:  (TBD) Living arrangements for the past 2 months: Single Family Home                                       Social Determinants of Health (SDOH) Interventions    Readmission Risk Interventions     No data to display

## 2022-01-16 NOTE — Assessment & Plan Note (Signed)
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 -Skin biopsy obtained, preliminary results were negative for calciphylaxis

## 2022-01-16 NOTE — TOC Progression Note (Signed)
Transition of Care [facility]) - Progression Note    Patient Details  Name: Queenie Aufiero MRN: [id number] Date of Birth: 1956-07-28  Transition of Care [facility]) CM/SW [place], RN Phone Number: 01/16/2022, 2:06 PM  Clinical Narrative:    Patient's cousin Rolan called and provided a working number [phone],  Provided the information to the physician to call with update   Expected Discharge Plan:  (TBD) Barriers to Discharge: Continued Medical Work up  Expected Discharge Plan and Services Expected Discharge Plan:  (TBD)   Discharge Planning Services: CM Consult Post Acute Care Choice:  (TBD) Living arrangements for the past 2 months: Single Family Home                                       Social Determinants of Health (SDOH) Interventions    Readmission Risk Interventions     No data to display

## 2022-01-16 NOTE — Progress Notes (Signed)
Progress Note   Patient: Theresa Warren EHM:[id number] DOB: May 11, 1956 DOA: 12/31/2021     15 DOS: the patient was seen and examined on 01/16/2022   Brief hospital course: Ms. Elisabella Hacker is a 66 year old female hyperlipidemia, hypertension, CKD stage V, insulin-dependent diabetes mellitus, who presents emergency department from [facility] for evaluation of low sodium noted on labs at the facility.  She was discharged to rehab on 5/23 after being admitted following a fall and also with acute on chronic anemia.  ED Course - temp 99.6, HR 107, otherwise unremarkable vitals. Labs with sodium 129, potassium 5.1, chloride 99, bicarb 17, BUN of 100, serum creatinine of 4.61, GFR of 10, WBC 18.4, hemoglobin 6.9, platelets of 473.  LFT's were also elevated.  Patient met criteria for sepsis on arrival, treated per protocol in the ED with IV Cefepime, Flagyl, Vanc, and LR 1 L bolus fluids.  Infection source unclear but felt possibly due to sacral wound infection and concern for underlying osteomyelitis.  Admitted to the hospitalist service for further evaluation and management.  Continued on IV Cefepime and Vanc.   General surgery was consulted for debridement on sacral pressure injury.  Nephrology consulted due to worsening renal function.   Weeks summary from prior provider. Patient was lethargic and displayed uremic symptoms.  She was started on hemodialysis after tunneled dialysis catheter was placed by vascular surgery on 6/9.  First dialysis was started on 6/9.  In the evening she was febrile and has been spiking fevers for the past few days.  She was placed on IV meropenem broad-spectrum and ID was consulted.  Also she has left thigh induration wounds soft tissue ultrasound was negative and she was also negative for bilateral DVTs.  6/14: 2 separate wound cultures, 1 with MSSA and Enterococcus faecalis and second with MSSA and Staph epidermidis resistant to methicillin.  Antibiotics  were switched to meropenem.  ID is on board.  Patient had multiple wound debridements. 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 Autoimmune and vasculitis labs pending.  CK low at 10, acute hepatitis panel negative. Patient also has postmenopausal vaginal bleeding resulted in blood loss anemia, endometrial thickening on scan which will required outpatient gynecologic evaluation to rule out malignancy.  So far received 2 unit of PRBC.  6/15: Her wound continued to get worse.  Skin biopsy for concern of calciphylaxis was done by general surgery on 01/14/2022.  New pictures in ID note. ANA comprehensive profile was negative.  Rest of the autoimmune labs are pending.  6/16: ANCA profile negative.  Antiphospholipid antibody and skin biopsy is still pending. CBG elevated, increased Semglee to twice daily and added mealtime coverage. Preliminary skin biopsy reports with no calciphylaxis, some microthrombosis, slight sent to [facility] for dermatology department review. Multiple deep wounds and necrosis.  See today's surgery progress note for new pictures.    Assessment and Plan: * Severe sepsis ([place]) POA as evidenced by tachycardia, leukocytosis, AKI and transaminitis consistent with organ dysfunction and severe sepsis with suspected infection source being sacral wounds versus an abdominal source (CT abdomen pelvis negative for anything acute however).  MRSA PCR negative. Wound cultures grew few Staph aureus, rare Enterococcus faecalis. Initially treated with vancomycin/cefepime. Vanc stopped 6/4.  --Transition to p.o. Keflex to complete course, and then transition to meropenem as she became febrile again. -Repeat wound cultures with MSSA and staph epi which was resistant to methicillin. 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 -Skin biopsy was  done on 6/14 by general surgery, preliminary results are negative for calciphylaxis. -Sodium thiosulfate was  given during dialysis on 6/14 for concern of calciphylaxis -Continue with meropenem -Continue with wound care -Follow final skin biopsy results  Swelling of left lower extremity With redness, patient attributes it to clindamycin.  She reported on admission that this has been present for over one month.   Left lower extremity Doppler US on 12/18/21 was negative for DVT.  Monitor closely for now.  Sacral wound, initial encounter See above. Pictures in surgery note  Chronic kidney disease, stage 5 ([place]) Not transition to ESRD and patient was started on dialysis. -Continue with dialysis -Appreciate nephrology help  AKI (acute kidney injury) ([place]) Please see above as patient is now on dialysis.  Premature atrial complexes Patient became significantly tachycardic on 6/2 with heart rates sustaining in the 130s.  Cardiology reviewed telemetry and feels patient dysrhythmias were not A-fib but sinus tach with PACs. --Cardiology following -- Continue Lopressor 25 mg BID   Hypomagnesemia Resolved with replacement on 6/3. Monitor and replace Mg as needed.   Symptomatic anemia Hemoglobin on admission was 6.9 >> 6.3, down from 7.6 at time of discharge on 5/23.  No signs of active bleeding, did have vaginal/endometrial bleeding previous admission but no reports of this recently.  May have slow chronic blood loss from sacral wounds.  Has baseline anemia of chronic disease due to renal failure. Received 2 unit PRBC transfusion . -Continue to monitor -Transfuse if below 7  Abdominal pain Patient reported on admission.  CT of abdomen pelvis showed abnormal endometrial thickening for a postmenopausal patient, bladder distention estimated 520 mL, no other acute or inflammatory processes or acute findings. --Monitor clinically for now -- Needs OB/GYN follow-up for endometrial sampling to exclude endometrial carcinoma  Hyponatremia Resolved.  Presented with sodium 129.  This was noted on labs at  [facility] and was the reason patient presented to the ED. improved with IV hydration. -Continue to monitor  Essential hypertension Blood pressure mildly elevated. 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 -Skin biopsy obtained, preliminary results were negative for calciphylaxis   Anasarca associated with disorder of kidney Nephrology following. Treated with Lasix IV, later Lasix drip, IV albumin.  Nephrology switched back to PO Lasix today.  Monitor I/O's & Daily Wts  Elevated LFTs On admission, alk phos 212, AST 182, ALT 107.  Unclear etiology, repeat LFTs not available today.  Appears LFTs were elevated at time of discharge on 5/23 but have increased since. RUQ ultrasound unremarkable. Acute hepatitis panel negative. -- Follow CMP  Pressure injury of skin Present on admission.  Patient has multiple sacral pressure injuries.  Some concern at time of admission that these were the source of sepsis. General surgery consulted and took patient to the OR on 6/1 for debridement.   Now with wound VAC. --Initial wound VAC change Monday 6/5 then every M/W/F -- Wound care consulted -- Frequent repositioning --Follow general surgery recommendations --Pain control --Follow blood cultures Pressure Injury 01/01/22 Buttocks Left Stage 3 -  Full thickness tissue loss. Subcutaneous fat may be visible but bone,  tendon or muscle are NOT exposed. (Active)  01/01/22 0222  Location: Buttocks  Location Orientation: Left  Staging: Stage 3 -  Full thickness  tissue loss. Subcutaneous fat may be visible but bone, tendon or muscle are NOT exposed.  Wound Description (Comments):   Present on Admission: Yes     Pressure Injury 01/01/22 Buttocks Right Stage 2 -  Partial thickness loss of dermis presenting as a shallow open injury with a red, pink wound bed without slough. (Active)  01/01/22 0223  Location: Buttocks  Location Orientation: Right  Staging: Stage 2 -  Partial thickness loss of dermis presenting as a shallow open injury with a red, pink wound bed without slough.  Wound Description (Comments):   Present on Admission: Yes     Pressure Injury 01/01/22 Hip Left Stage 2 -  Partial thickness loss of dermis presenting as a shallow open injury with a red, pink wound bed without slough. (Active)  01/01/22 0225  Location: Hip  Location Orientation: Left  Staging: Stage 2 -  Partial thickness loss of dermis presenting as a shallow open injury with a red, pink wound bed without slough.  Wound Description (Comments):   Present on Admission: Yes     Pressure Injury 01/01/22 Hip Right Stage 3 -  Full thickness tissue loss. Subcutaneous fat may be visible but bone, tendon or muscle are NOT exposed. (Active)  01/01/22 0225  Location: Hip  Location Orientation: Right  Staging: Stage 3 -  Full thickness tissue loss. Subcutaneous fat may be visible but bone, tendon or muscle are NOT exposed.  Wound Description (Comments):   Present on Admission: Yes      OSA (obstructive sleep apnea) .  Appears not to be on CPAP        Subjective: Patient was seen during dialysis today.  Continued to have significant pain in her wounds especially in the buttock region.  Physical Exam: Vitals:   01/16/22 1345 01/16/22 1400 01/16/22 1415 01/16/22 1433  BP: (!) 125/52 138/62 (!) 129/57 (!) 153/59  Pulse: 73 74 76 81  Resp: 18 19  (!) 21  Temp:    98.9 F (37.2 C)  TempSrc:    Oral  SpO2: 95% 95% 97% 99%  Weight:    104 kg  Height:       General.   Obese lady, in no acute distress.  Multiple appointments with clean dressing. Pulmonary.  Lungs clear bilaterally, normal respiratory effort. CV.  Regular rate and rhythm, no JVD, rub or murmur. Abdomen.  Soft, nontender, nondistended, BS positive. CNS.  Alert and oriented .  No focal neurologic deficit. Extremities.  2+ LE edema, no cyanosis, pulses intact and symmetrical. Psychiatry.  Judgment and insight appears normal.  Data Reviewed: Prior notes, labs reviewed  Family Communication: Tried calling cousin Rolan again, unable to reach him.  Disposition: Status is: Inpatient Remains inpatient appropriate because: Severity of illness   Planned Discharge Destination: Skilled nursing facility  DVT prophylaxis.  Subcu heparin Time spent: 50 minutes  This record has been created using Systems analyst. Errors have been sought and corrected,but may not always be located. Such creation errors do not reflect on the standard of care.  Author: Lorella Nimrod, MD 01/16/2022 2:56 PM  For on call review [email or web address].

## 2022-01-16 NOTE — Assessment & Plan Note (Signed)
Resolved with replacement on 6/3. Monitor and replace Mg as needed.

## 2022-01-16 NOTE — TOC Progression Note (Addendum)
Transition of Care [facility]) - Progression Note    Patient Details  Name: Theresa Warren MRN: [id number] Date of Birth: 04-22-1956  Transition of Care [facility]) CM/SW [place], RN Phone Number: 01/16/2022, 11:55 AM  Clinical Narrative:    New number for the cousin Rolan  [phone] Provided to Physician to call   Expected Discharge Plan:  (TBD) Barriers to Discharge: Continued Medical Work up  Expected Discharge Plan and Services Expected Discharge Plan:  (TBD)   Discharge Planning Services: CM Consult Post Acute Care Choice:  (TBD) Living arrangements for the past 2 months: Single Family Home                                       Social Determinants of Health (SDOH) Interventions    Readmission Risk Interventions     No data to display

## 2022-01-16 NOTE — Assessment & Plan Note (Signed)
Resolved.  Presented with sodium 129.  This was noted on labs at [facility] and was the reason patient presented to the ED. improved with IV hydration. -Continue to monitor

## 2022-01-16 NOTE — Assessment & Plan Note (Signed)
See above. Pictures in surgery note

## 2022-01-16 NOTE — Assessment & Plan Note (Signed)
With redness, patient attributes it to clindamycin.  She reported on admission that this has been present for over one month.   Left lower extremity Doppler US on 12/18/21 was negative for DVT.  Monitor closely for now.

## 2022-01-16 NOTE — Progress Notes (Signed)
Hemodialysis Post Treatment Note  16 January 2022  Access:  Right Subclavian Catheter   UF Removed:   BFR: .5 Liter      400  Next Scheduled Treatment: 01/19/22  Note:  Patient seen for scheduled hemodialysis treatment, CVC intact, without signs of infection. Prescribed BFR is maintained throughout the course of treatment, targeted UF reached with 0.5-liter fluid removal. Medication given per order. Patient stable throughout treatment, no concerns raised, transported to assigned room post tx, report provided to primary nurse.

## 2022-01-16 NOTE — Progress Notes (Addendum)
[place] Hospital Day(s): 15.   Post op day(s): 7 Days Post-Op.   Interval History:  Patient seen and examined No acute events or new complaints overnight.  Patient reports she continues to have lots of pain in her buttock and legs; worse with dressing changes Labs are pending Skin biopsy from 06/14 pending as well   Vital signs in last 24 hours: [min-max] current  Temp:  [98.3 F (36.8 C)-99.5 F (37.5 C)] 98.6 F (37 C) (06/16 0755) Pulse Rate:  [43-108] 75 (06/16 0755) Resp:  [16-18] 18 (06/16 0755) BP: (124-161)/(53-58) 126/58 (06/16 0755) SpO2:  [92 %-98 %] 97 % (06/16 0755) Weight:  [105 kg] 105 kg (06/16 0500)     Height: 5\' 3"  (160 cm) Weight: 105 kg BMI (Calculated): 41.02   Intake/Output last 2 shifts:  06/15 0701 - 06/16 0700 In: 600 [P.O.:300; IV Piggyback:300] Out: -    Physical Exam:  Constitutional: alert, cooperative and no distress  Respiratory: breathing non-labored at rest  Cardiovascular: regular rate and sinus rhythm  Integumentary:   - Left Ischial Wound: 10cm x 5cm x 2cm, there is granulating tissue in the wound bed however there is a significant amount of necrotic tissue on the wound edges.   - Bilateral Gluteal Wounds: Left measures 8cm x 6cm x 1.0cm and the right measures 7cm x 9cm x 1.0cm. There is some early granulation tissue but also has areas of further necrosis, no gross infection.   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   Left Ischial Wound & Bilateral Gluteal wounds (01/16/2022):             Labs:     Latest Ref Rng & Units 01/14/2022    4:06 AM 01/13/2022   11:17 AM 01/12/2022   10:54 AM  CBC  WBC 4.0 - 10.5 K/uL 15.9   13.6    13.7   Hemoglobin 12.0 - 15.0 g/dL 7.8  7.7  7.4    7.4   Hematocrit 36.0 - 46.0 % 24.8  24.0  23.2    23.5   Platelets 150 - 400 K/uL 254   255    270       Latest  Ref Rng & Units 01/14/2022    4:06 AM 01/12/2022   10:53 AM 01/11/2022    6:25 AM  CMP  Glucose 70 - 99 mg/dL 195  124  99   BUN 8 - 23 mg/dL 58  70  60   Creatinine 0.44 - 1.00 mg/dL 4.82  5.02  3.01   Sodium 135 - 145 mmol/L 133  134  135   Potassium 3.5 - 5.1 mmol/L 3.7  3.8  3.6   Chloride 98 - 111 mmol/L 94  95  96   CO2 22 - 32 mmol/L 26  26  28    Calcium 8.9 - 10.3 mg/dL 9.3  8.8  9.3   Total Protein 6.5 - 8.1 g/dL  6.3    Total Bilirubin 0.3 - 1.2 mg/dL  0.9    Alkaline Phos 38 - 126 U/L  299    AST 15 - 41 U/L  262    ALT 0 - 44 U/L  156       Imaging studies: No new pertinent imaging studies   Assessment/Plan:  66 y.o. female 15 days s/p debridement of three decubitus wounds of bilateral buttocks (total area 133 cm2) and placement  of negative pressure dressing (> 50 cm).    - Continue wound vac for now; MWF schedule. We did discuss wound vac use in wounds with >25% necrosis. Unfortunately, I do not think we have a lot of options in this case. She has a difficult time with dressing changes, and I think the benefit of only changing wound vac 3x weekly outweighs transitioning to daily gauze dressings.   - Follow up skin biopsy results; pending   - Continue Abx; ID on board           - Local wound care; pressure offload, frequent repositioning, low air loss mattress           - Pain control prn           - Further management per primary service; we will follow along  All of the above findings and recommendations were discussed with the patient, and the medical team, and all of patient's questions were answered to her expressed satisfaction.  -- Edison Simon, PA-C [facility] 01/16/2022, 9:15 AM M-F: 7am - 4pm

## 2022-01-16 NOTE — Progress Notes (Signed)
[facility]  PROGRESS NOTE   Subjective:   Patient seen and evaluated during dialysis   HEMODIALYSIS FLOWSHEET:  Blood Flow Rate (mL/min): 400 mL/min Arterial Pressure (mmHg): -160 mmHg Venous Pressure (mmHg): 140 mmHg Transmembrane Pressure (mmHg): 60 mmHg Ultrafiltration Rate (mL/min): 330 mL/min Dialysate Flow Rate (mL/min): 550 ml/min Conductivity: 13.9 Conductivity: 13.9 Dialysis Fluid Bolus: Normal Saline Bolus Amount (mL): 250 mL  Confusion remains at times Mild discomfort from sacral area   Objective:  Vital signs: Blood pressure (!) 153/59, pulse 81, temperature 98.9 F (37.2 C), temperature source Oral, resp. rate (!) 21, height 5\' 3"  (1.6 m), weight 104 kg, SpO2 99 %.  Intake/Output Summary (Last 24 hours) at 01/16/2022 1448 Last data filed at 01/16/2022 1433 Gross per 24 hour  Intake 420 ml  Output 500 ml  Net -80 ml    Filed Weights   01/16/22 0500 01/16/22 1133 01/16/22 1433  Weight: 105 kg 103 kg 104 kg     Physical Exam: General:  No acute distress  Head:  Normocephalic, atraumatic. Moist oral mucosal membranes  Eyes:  Anicteric  Lungs:   Clear to auscultation, normal effort  Heart:  S1S2 no rubs  Abdomen:   Soft, nontender, bowel sounds present  Extremities:  + peripheral edema.  Neurologic:  Awake, alert, following commands  Skin:  No lesions, sacral wound with NPWV  Access: RT Permcath    Basic Metabolic Panel: Recent Labs  Lab 01/11/22 0625 01/12/22 1053 01/14/22 0406 01/16/22 0916  NA 135 134* 133* 133*  K 3.6 3.8 3.7 3.5  CL 96* 95* 94* 96*  CO2 28 26 26 25   GLUCOSE 99 124* 195* 266*  BUN 60* 70* 58* 74*  CREATININE 3.01* 5.02* 4.82* 4.89*  CALCIUM 9.3 8.8* 9.3 9.4  PHOS  --  5.0* 4.0 3.9     CBC: Recent Labs  Lab 01/11/22 1752 01/12/22 1054 01/13/22 1117 01/14/22 0406 01/16/22 0916  WBC 11.3* 13.7*  13.6*  --  15.9* 11.1*  NEUTROABS  --  11.0*  --   --   --   HGB 7.9* 7.4*  7.4* 7.7* 7.8* 7.0*   HCT 25.2* 23.5*  23.2* 24.0* 24.8* 22.2*  MCV 93.7 94.0  92.4  --  93.9 92.1  PLT 295 270  255  --  254 253      Urinalysis: No results for input(s): "COLORURINE", "LABSPEC", "PHURINE", "GLUCOSEU", "HGBUR", "BILIRUBINUR", "KETONESUR", "PROTEINUR", "UROBILINOGEN", "NITRITE", "LEUKOCYTESUR" in the last 72 hours.  Invalid input(s): "APPERANCEUR"    Imaging: No results found.   Medications:    meropenem (MERREM) IV Stopped (01/15/22 1724)   sodium thiosulfate 25 g in sodium chloride 0.9 % 200 mL Infusion for Calciphylaxis      (feeding supplement) PROSource Plus  30 mL Oral TID BM   vitamin C  500 mg Oral BID   Chlorhexidine Gluconate Cloth  6 each Topical Q0600   epoetin alfa       epoetin (EPOGEN/PROCRIT) injection  10,000 Units Intravenous Q M,W,F-HD   fluticasone  2 spray Each Nare Daily   furosemide  40 mg Oral Daily   gabapentin  100 mg Oral TID   heparin injection (subcutaneous)  5,000 Units Subcutaneous Q8H   insulin aspart  0-5 Units Subcutaneous QHS   insulin aspart  0-9 Units Subcutaneous TID WC   insulin aspart  3 Units Subcutaneous TID WC   insulin glargine-yfgn  5 Units Subcutaneous BID   leptospermum manuka honey  1 application  Topical  Daily   lidocaine-EPINEPHrine  20 mL Intradermal Once   loratadine  10 mg Oral Daily   metoprolol succinate  50 mg Oral BID   multivitamin  1 tablet Oral QHS   nutrition supplement (JUVEN)  1 packet Oral BID BM    Assessment/ Plan:     Principal Problem:   Severe sepsis ([place]) Active Problems:   OSA (obstructive sleep apnea)   Chronic kidney disease, stage 5 (HCC)   Essential hypertension   DNR (do not resuscitate)/DNI(Do Not Intubate)   Type 2 diabetes mellitus with chronic kidney disease, with long-term current use of insulin (HCC)   Hypomagnesemia   Pressure injury of skin   Symptomatic anemia   Elevated LFTs   Sacral wound, initial encounter   Swelling of left lower extremity   Abdominal pain   AKI (acute  kidney injury) ([place])   Hyponatremia   Premature atrial complexes   Anasarca associated with disorder of kidney   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  Patient is reached end-stage renal disease and was started on renal replacement therapy.   #1: End-stage renal disease: Patient has a permacath placed.   Dialysis received today, UF 574ml achieved. Next treatment scheduled for Monday.  Recommend palliative consult to evaluate GOC. Valid concern of whether patient will be able to tolerate sitting in a chair for 4-5 hours for outpatient dialysis. If patient is unable to tolerate sitting in reclinier for this length of time, continuing dialysis may not be beneficial.    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  Negative pressure wound VAC remains in place and managed by surgery.  Sodium thiosulfate with dialysis treatments.    #3: Anemia secondary to chronic kidney disease complicated by sepsis. Has received blood transfusions during this admission.  Hemoglobin 7.0. EPO with dialysis treatments.   #4: Secondary hyperparathyroidism: Calcium and phosphorus are at goal   #5: Acute metabolic acidosis: Resolved.     #6: Congestive heart failure: Echo from 01/03/22 show mild LVH at basal septal segment. Daily furosemide 40 mg    LOS: [facility] 6/16/20232:48 PM

## 2022-01-16 NOTE — Assessment & Plan Note (Signed)
POA as evidenced by tachycardia, leukocytosis, AKI and transaminitis consistent with organ dysfunction and severe sepsis with suspected infection source being sacral wounds versus an abdominal source (CT abdomen pelvis negative for anything acute however).  MRSA PCR negative. Wound cultures grew few Staph aureus, rare Enterococcus faecalis. Initially treated with vancomycin/cefepime. Vanc stopped 6/4.  --Transition to p.o. Keflex to complete course, and then transition to meropenem as she became febrile again. -Repeat wound cultures with MSSA and staph epi which was resistant to methicillin. 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 -Skin biopsy was done on 6/14 by general surgery, preliminary results are negative for calciphylaxis. -Sodium thiosulfate was given during dialysis on 6/14 for concern of calciphylaxis -Continue with meropenem -Continue with wound care -Follow final skin biopsy results

## 2022-01-17 DIAGNOSIS — N186 End stage renal disease: Secondary | ICD-10-CM | POA: Diagnosis not present

## 2022-01-17 LAB — GLUCOSE, CAPILLARY
Glucose-Capillary: 190 mg/dL — ABNORMAL HIGH (ref 70–99)
Glucose-Capillary: 261 mg/dL — ABNORMAL HIGH (ref 70–99)
Glucose-Capillary: 325 mg/dL — ABNORMAL HIGH (ref 70–99)
Glucose-Capillary: 331 mg/dL — ABNORMAL HIGH (ref 70–99)

## 2022-01-17 MED ORDER — MUSCLE RUB 10-15 % EX CREA: TOPICAL_CREAM | CUTANEOUS | Status: DC | PRN

## 2022-01-17 NOTE — Progress Notes (Addendum)
Occupational Therapy Treatment Patient Details Name: Theresa Warren MRN: [id number] DOB: 05-20-1956 Today's Date: 01/17/2022   History of present illness [name] is a 66 year old female admitted for evaluation of low sodium noted on labs at the facility.  Pt was discharged  to rehab on 5/23 after being admitted following a fall. PMH significant for acute on chronic anemia. hyperlipidemia, hypertension, CKD stage V, insulin-dependent diabetes mellitus   OT comments  Chart reviewed, RN cleared pt for participation in OT tx session. Pt requests to mobilize on this date. Tx session targeted progressing functional mobility and activity tolerance to improve participation in activity. Significant improvement noted in activity tolerance on this date, with pt completing bed mobility with MIN A, STS with MIN A with RW, SPT to bedside chair with MIN A with RW. Pt left in chair with pressure relieving cushion, eating a snack with SET UP. Pt able to perform anterior and lateral weight shifts with supervision. Red theraband provided with HEP demo for UB strengthening. RN aware of pt status. OT will continue to follow acutely.    Recommendations for follow up therapy are one component of a multi-disciplinary discharge planning process, led by the attending physician.  Recommendations may be updated based on patient status, additional functional criteria and insurance authorization.    Follow Up Recommendations  Skilled nursing-short term rehab (<3 hours/day)    Assistance Recommended at Discharge Frequent or constant Supervision/Assistance  Patient can return home with the following  A lot of help with walking and/or transfers;A lot of help with bathing/dressing/bathroom   Equipment Recommendations  BSC/3in1    Recommendations for Other Services      Precautions / Restrictions Precautions Precautions: Fall Precaution Comments: wound vac sacral area Restrictions Weight Bearing  Restrictions: No       Mobility Bed Mobility Overal bed mobility: Needs Assistance Bed Mobility: Supine to Sit     Supine to sit: Min assist, HOB elevated          Transfers Overall transfer level: Needs assistance Equipment used: Rolling walker (2 wheels) Transfers: Sit to/from Stand Sit to Stand: Min assist, From elevated surface                 Balance Overall balance assessment: Needs assistance Sitting-balance support: Feet supported, Bilateral upper extremity supported Sitting balance-Leahy Scale: Good     Standing balance support: Bilateral upper extremity supported, During functional activity Standing balance-Leahy Scale: Fair                             ADL either performed or assessed with clinical judgement   ADL Overall ADL's : Needs assistance/impaired Eating/Feeding: Sitting;Set up   Grooming: Wash/dry face;Sitting;Set up                   Toilet Transfer: Minimal assistance;Stand-pivot Toilet Transfer Details (indicate cue type and reason): simulated to bedside chair                Extremity/Trunk Assessment              Vision       Perception     Praxis      Cognition Arousal/Alertness: Awake/alert Behavior During Therapy: Flat affect Overall Cognitive Status: Impaired/Different from baseline Area of Impairment: Orientation, Attention, Following commands, Safety/judgement, Awareness, Problem solving                 Orientation Level: Disoriented to Current  Attention Level: Sustained   Following Commands: Follows one step commands with increased time   Awareness: Emergent Problem Solving: Requires verbal cues, Difficulty sequencing, Decreased initiation, Requires tactile cues, Slow processing          Exercises      Shoulder Instructions       General Comments      Pertinent Vitals/ Pain       Pain Assessment Pain Assessment: 0-10 Pain Score: 6  Pain Location: sacrum Pain  Descriptors / Indicators: Grimacing, Guarding Pain Intervention(s): Limited activity within patient's tolerance, Monitored during session, Repositioned, Premedicated before session  Home Living                                          Prior Functioning/Environment              Frequency  Min 2X/week        Progress Toward Goals  OT Goals(current goals can now be found in the care plan section)  Progress towards OT goals: Progressing toward goals     Plan Discharge plan remains appropriate;Frequency remains appropriate    Co-evaluation                 AM-PAC OT "6 Clicks" Daily Activity     Outcome Measure   Help from another person eating meals?: None Help from another person taking care of personal grooming?: A Little Help from another person toileting, which includes using toliet, bedpan, or urinal?: A Lot Help from another person bathing (including washing, rinsing, drying)?: A Lot Help from another person to put on and taking off regular upper body clothing?: A Lot Help from another person to put on and taking off regular lower body clothing?: Total 6 Click Score: 14    End of Session Equipment Utilized During Treatment: Rolling walker (2 wheels);Gait belt  OT Visit Diagnosis: Other abnormalities of gait and mobility (R26.89);Muscle weakness (generalized) (M62.81)   Activity Tolerance Patient limited by pain   Patient Left with call bell/phone within reach;with chair alarm set;in chair   Nurse Communication Mobility status        Time: 9480-1655 OT Time Calculation (min): 26 min  Charges: OT General Charges $OT Visit: 1 Visit OT Treatments $Self Care/Home Management : 8-22 mins $Therapeutic Activity: 8-22 mins  Shanon Payor, OTD OTR/L  01/17/22, 3:41 PM

## 2022-01-17 NOTE — Progress Notes (Signed)
[facility]  PROGRESS NOTE   Subjective:   Patient seen at bedside today.  Denies any acute complaints or shortness of breath.  Objective:  Vital signs: Blood pressure 136/64, pulse 71, temperature 97.9 F (36.6 C), temperature source Oral, resp. rate 16, height 5\' 3"  (1.6 m), weight 104 kg, SpO2 98 %.  Intake/Output Summary (Last 24 hours) at 01/17/2022 1151 Last data filed at 01/16/2022 1433 Gross per 24 hour  Intake --  Output 500 ml  Net -500 ml    Filed Weights   01/16/22 1133 01/16/22 1433 01/17/22 0500  Weight: 103 kg 104 kg 104 kg     Physical Exam: General:  No acute distress  Head:  Normocephalic, atraumatic. Moist oral mucosal membranes  Eyes:  Anicteric  Lungs:   Clear to auscultation, normal effort  Heart:  S1S2 no rubs  Abdomen:   Soft, nontender, bowel sounds present  Extremities:  + peripheral edema.  Neurologic:  Awake, alert, following commands  Skin:  No lesions, sacral wound with NPWV  Access: RT Permcath    Basic Metabolic Panel: Recent Labs  Lab 01/11/22 0625 01/12/22 1053 01/14/22 0406 01/16/22 0916  NA 135 134* 133* 133*  K 3.6 3.8 3.7 3.5  CL 96* 95* 94* 96*  CO2 28 26 26 25   GLUCOSE 99 124* 195* 266*  BUN 60* 70* 58* 74*  CREATININE 3.01* 5.02* 4.82* 4.89*  CALCIUM 9.3 8.8* 9.3 9.4  PHOS  --  5.0* 4.0 3.9     CBC: Recent Labs  Lab 01/11/22 1752 01/12/22 1054 01/13/22 1117 01/14/22 0406 01/16/22 0916  WBC 11.3* 13.7*  13.6*  --  15.9* 11.1*  NEUTROABS  --  11.0*  --   --   --   HGB 7.9* 7.4*  7.4* 7.7* 7.8* 7.0*  HCT 25.2* 23.5*  23.2* 24.0* 24.8* 22.2*  MCV 93.7 94.0  92.4  --  93.9 92.1  PLT 295 270  255  --  254 253      Urinalysis: No results for input(s): "COLORURINE", "LABSPEC", "PHURINE", "GLUCOSEU", "HGBUR", "BILIRUBINUR", "KETONESUR", "PROTEINUR", "UROBILINOGEN", "NITRITE", "LEUKOCYTESUR" in the last 72 hours.  Invalid input(s): "APPERANCEUR"    Imaging: No results  found.   Medications:    meropenem (MERREM) IV 500 mg (01/16/22 1737)   sodium thiosulfate 25 g in sodium chloride 0.9 % 200 mL Infusion for Calciphylaxis      (feeding supplement) PROSource Plus  30 mL Oral TID BM   vitamin C  500 mg Oral BID   Chlorhexidine Gluconate Cloth  6 each Topical Q0600   epoetin (EPOGEN/PROCRIT) injection  10,000 Units Intravenous Q M,W,F-HD   fluticasone  2 spray Each Nare Daily   furosemide  40 mg Oral Daily   gabapentin  100 mg Oral TID   heparin injection (subcutaneous)  5,000 Units Subcutaneous Q8H   insulin aspart  0-5 Units Subcutaneous QHS   insulin aspart  0-9 Units Subcutaneous TID WC   insulin aspart  3 Units Subcutaneous TID WC   insulin glargine-yfgn  5 Units Subcutaneous BID   leptospermum manuka honey  1 application  Topical Daily   lidocaine-EPINEPHrine  20 mL Intradermal Once   loratadine  10 mg Oral Daily   metoprolol succinate  50 mg Oral BID   multivitamin  1 tablet Oral QHS   nutrition supplement (JUVEN)  1 packet Oral BID BM    Assessment/ Plan:     Principal Problem:   Severe sepsis (HCC) Active Problems:  OSA (obstructive sleep apnea)   Chronic kidney disease, stage 5 (HCC)   Essential hypertension   DNR (do not resuscitate)/DNI(Do Not Intubate)   Type 2 diabetes mellitus with chronic kidney disease, with long-term current use of insulin (HCC)   Hypomagnesemia   Pressure injury of skin   Symptomatic anemia   Elevated LFTs   Sacral wound, initial encounter   Swelling of left lower extremity   Abdominal pain   AKI (acute kidney injury) ([place])   Hyponatremia   Premature atrial complexes   Anasarca associated with disorder of kidney   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  Patient is reached end-stage renal disease and was started on renal replacement therapy.    #1: End-stage renal disease: Patient has a permacath   Tolerated her treatment on Friday.  Next treatment scheduled for Monday.  Recommend palliative consult to evaluate GOC. Valid concern of whether patient will be able to tolerate sitting in a chair for 4-5 hours for outpatient dialysis.    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  Negative pressure wound VAC remains in place and managed by surgery.  Sodium thiosulfate with dialysis treatments.    #3: Anemia secondary to chronic kidney disease complicated by sepsis. Has received blood transfusions during this admission.  - EPO with dialysis treatments.   #4: Secondary hyperparathyroidism: Calcium and phosphorus are at goal   #5: Acute metabolic acidosis: Resolved.     #6 2D echo from January 03, 2022-:LVEF 60 to 65%, no regional wall motion abnormalities, asymmetric left ventricular hypertrophy of the basal septal segment.  Diastolic parameters normal.    LOS: [facility] 6/17/202311:51 AM

## 2022-01-17 NOTE — Progress Notes (Signed)
Patient refusing any foam dressings be placed/replaced on bottom around area where vac is placed. States "it's been a nice day and I don't want to ruin it." All other would on legs and hip changed/dressed per orders.

## 2022-01-17 NOTE — Progress Notes (Signed)
Progress Note   Patient: Theresa Warren DOB: Apr 05, 1956 DOA: 12/31/2021     16 DOS: the patient was seen and examined on 01/17/2022   Brief hospital course: [name] is a 66 year old female hyperlipidemia, hypertension, CKD stage V, insulin-dependent diabetes mellitus, who presents emergency department from [facility] for evaluation of low sodium noted on labs at the facility.  She was discharged to rehab on 5/23 after being admitted following a fall and also with acute on chronic anemia.  ED Course - temp 99.6, HR 107, otherwise unremarkable vitals. Labs with sodium 129, potassium 5.1, chloride 99, bicarb 17, BUN of 100, serum creatinine of 4.61, GFR of 10, WBC 18.4, hemoglobin 6.9, platelets of 473.  LFT's were also elevated.  Patient met criteria for sepsis on arrival, treated per protocol in the ED with IV Cefepime, Flagyl, Vanc, and LR 1 L bolus fluids.  Infection source unclear but felt possibly due to sacral wound infection and concern for underlying osteomyelitis.  Admitted to the hospitalist service for further evaluation and management.  Continued on IV Cefepime and Vanc.   General surgery was consulted for debridement on sacral pressure injury.  Nephrology consulted due to worsening renal function.   Weeks summary from prior provider. Patient was lethargic and displayed uremic symptoms.  She was started on hemodialysis after tunneled dialysis catheter was placed by vascular surgery on 6/9.  First dialysis was started on 6/9.  In the evening she was febrile and has been spiking fevers for the past few days.  She was placed on IV meropenem broad-spectrum and ID was consulted.  Also she has left thigh induration wounds soft tissue ultrasound was negative and she was also negative for bilateral DVTs.  6/14: 2 separate wound cultures, 1 with MSSA and Enterococcus faecalis and second with MSSA and Staph epidermidis resistant to methicillin.  Antibiotics  were switched to meropenem.  ID is on board.  Patient had multiple wound debridements. 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 Autoimmune and vasculitis labs pending.  CK low at 10, acute hepatitis panel negative. Patient also has postmenopausal vaginal bleeding resulted in blood loss anemia, endometrial thickening on scan which will required outpatient gynecologic evaluation to rule out malignancy.  So far received 2 unit of PRBC.  6/15: Her wound continued to get worse.  Skin biopsy for concern of calciphylaxis was done by general surgery on 01/14/2022.  New pictures in ID note. ANA comprehensive profile was negative.  Rest of the autoimmune labs are pending.  6/16: ANCA profile negative.  Antiphospholipid antibody and skin biopsy is still pending. CBG elevated, increased Semglee to twice daily and added mealtime coverage. Preliminary skin biopsy reports with no calciphylaxis, some microthrombosis, slight sent to [facility] for dermatology department review. Multiple deep wounds and necrosis.  See today's surgery progress note for new pictures.  6/17: Patient antiphospholipid syndrome profile with negative anticardiolipin IgG and IgM but positive PTT lupus anticoagulant and DRVVT, on further characterization DRVVT mix was slightly elevated with normal DRVVT confirm.  Final skin biopsy results still pending.    Assessment and Plan: * Severe sepsis ([place]) POA as evidenced by tachycardia, leukocytosis, AKI and transaminitis consistent with organ dysfunction and severe sepsis with suspected infection source being sacral wounds versus an abdominal source (CT abdomen pelvis negative for anything acute however).  MRSA PCR negative. Wound cultures grew few Staph aureus, rare Enterococcus faecalis. Initially treated with vancomycin/cefepime. Vanc stopped 6/4.  --Transition to p.o. Keflex to  complete course, and then transition to meropenem as she became febrile again. -Repeat wound  cultures with MSSA and staph epi which was resistant to methicillin. 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 -Skin biopsy was done on 6/14 by general surgery, preliminary results are negative for calciphylaxis. -Sodium thiosulfate was given during dialysis on 6/14 for concern of calciphylaxis -Continue with meropenem -Continue with wound care -Follow final skin biopsy results  Swelling of left lower extremity With redness, patient attributes it to clindamycin.  She reported on admission that this has been present for over one month.   Left lower extremity Doppler US on 12/18/21 was negative for DVT.  Monitor closely for now.  Sacral wound, initial encounter See above. Pictures in surgery note  Chronic kidney disease, stage 5 ([place]) Not transition to ESRD and patient was started on dialysis. -Continue with dialysis -Appreciate nephrology help  AKI (acute kidney injury) ([place]) Please see above as patient is now on dialysis.  Premature atrial complexes Patient became significantly tachycardic on 6/2 with heart rates sustaining in the 130s.  Cardiology reviewed telemetry and feels patient dysrhythmias were not A-fib but sinus tach with PACs. --Cardiology following -- Continue Lopressor 25 mg BID   Hypomagnesemia Resolved with replacement on 6/3. Monitor and replace Mg as needed.   Symptomatic anemia Hemoglobin on admission was 6.9 >> 6.3, down from 7.6 at time of discharge on 5/23.  No signs of active bleeding, did have vaginal/endometrial bleeding previous admission but no reports of this recently.  May have slow chronic blood loss from sacral wounds.  Has baseline anemia of chronic disease due to renal failure. Received 2 unit PRBC transfusion . -Continue to monitor -Transfuse if below 7  Abdominal pain Patient reported on admission.  CT of abdomen pelvis showed abnormal endometrial thickening for a postmenopausal patient, bladder distention  estimated 520 mL, no other acute or inflammatory processes or acute findings. --Monitor clinically for now -- Needs OB/GYN follow-up for endometrial sampling to exclude endometrial carcinoma  Hyponatremia Resolved.  Presented with sodium 129.  This was noted on labs at [facility] and was the reason patient presented to the ED. improved with IV hydration. -Continue to monitor  Essential hypertension Blood pressure mildly elevated. 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 -Skin biopsy obtained, preliminary results were negative for calciphylaxis   Anasarca associated with disorder of kidney Nephrology following. Treated with Lasix IV, later Lasix drip, IV albumin.  Nephrology switched back to PO Lasix today.  Monitor I/O's & Daily Wts  Elevated LFTs On admission, alk phos 212, AST 182, ALT 107.  Unclear etiology, repeat LFTs not available today.  Appears LFTs were elevated at time of discharge on 5/23 but have increased since. RUQ ultrasound unremarkable. Acute hepatitis panel negative. -- Follow CMP  Pressure injury of skin Present on admission.  Patient has multiple sacral pressure injuries.  Some concern at time of admission that these were the source of sepsis. General surgery consulted and took patient to the OR on 6/1 for debridement.   Now with wound VAC. --Initial wound VAC change Monday 6/5 then  every M/W/F -- Wound care consulted -- Frequent repositioning --Follow general surgery recommendations --Pain control --Follow blood  cultures Pressure Injury 01/01/22 Buttocks Left Stage 3 -  Full thickness tissue loss. Subcutaneous fat may be visible but bone, tendon or muscle are NOT exposed. (Active)  01/01/22 0222  Location: Buttocks  Location Orientation: Left  Staging: Stage 3 -  Full thickness tissue loss. Subcutaneous fat may be visible but bone, tendon or muscle are NOT exposed.  Wound Description (Comments):   Present on Admission: Yes     Pressure Injury 01/01/22 Buttocks Right Stage 2 -  Partial thickness loss of dermis presenting as a shallow open injury with a red, pink wound bed without slough. (Active)  01/01/22 0223  Location: Buttocks  Location Orientation: Right  Staging: Stage 2 -  Partial thickness loss of dermis presenting as a shallow open injury with a red, pink wound bed without slough.  Wound Description (Comments):   Present on Admission: Yes     Pressure Injury 01/01/22 Hip Left Stage 2 -  Partial thickness loss of dermis presenting as a shallow open injury with a red, pink wound bed without slough. (Active)  01/01/22 0225  Location: Hip  Location Orientation: Left  Staging: Stage 2 -  Partial thickness loss of dermis presenting as a shallow open injury with a red, pink wound bed without slough.  Wound Description (Comments):   Present on Admission: Yes     Pressure Injury 01/01/22 Hip Right Stage 3 -  Full thickness tissue loss. Subcutaneous fat may be visible but bone, tendon or muscle are NOT exposed. (Active)  01/01/22 0225  Location: Hip  Location Orientation: Right  Staging: Stage 3 -  Full thickness tissue loss. Subcutaneous fat may be visible but bone, tendon or muscle are NOT exposed.  Wound Description (Comments):   Present on Admission: Yes      OSA (obstructive sleep apnea) .  Appears not to be on CPAP     Subjective: Patient was seen and examined today.  She thinks that her strength is improving.  Feeling much less weak today.  She was hopeful that her wounds will  heal and then she can go back to her normal self.  Physical Exam: Vitals:   01/16/22 2017 01/17/22 0453 01/17/22 0500 01/17/22 0933  BP: (!) 139/50 (!) 140/58  136/64  Pulse: (!) 40 78  71  Resp: 18 18  16   Temp: 98.6 F (37 C) 98.9 F (37.2 C)  97.9 F (36.6 C)  TempSrc:    Oral  SpO2: 100% 97%  98%  Weight:   104 kg   Height:       General.     In no acute distress. Pulmonary.  Lungs clear bilaterally, normal respiratory effort. CV.  Regular rate and rhythm, no JVD, rub or murmur. Abdomen.  Soft, nontender, nondistended, BS positive. CNS.  Alert and oriented .  No focal neurologic deficit. Extremities.  1+ LE edema, no cyanosis, pulses intact and symmetrical.  Multiple clean bandages involving buttocks, hips and thighs. Psychiatry.  Judgment and insight appears normal.  Data Reviewed: Prior notes and labs reviewed  Family Communication: Discussed with patient  Disposition: Status is: Inpatient Remains inpatient appropriate because: Severity of illness   Planned Discharge Destination: Skilled nursing facility  DVT prophylaxis.  Heparin Time spent: 45 minutes  This record has been created using Systems analyst. Errors have been sought and corrected,but may not always be located. Such creation errors do  not reflect on the standard of care.  Author: Lorella Nimrod, MD 01/17/2022 1:39 PM  For on call review [email or web address].

## 2022-01-18 DIAGNOSIS — N186 End stage renal disease: Secondary | ICD-10-CM | POA: Diagnosis not present

## 2022-01-18 LAB — GLUCOSE, CAPILLARY
Glucose-Capillary: 321 mg/dL — ABNORMAL HIGH (ref 70–99)
Glucose-Capillary: 284 mg/dL — ABNORMAL HIGH (ref 70–99)
Glucose-Capillary: 203 mg/dL — ABNORMAL HIGH (ref 70–99)
Glucose-Capillary: 203 mg/dL — ABNORMAL HIGH (ref 70–99)

## 2022-01-18 MED ORDER — INSULIN ASPART 100 UNIT/ML IJ SOLN
5.0000 [IU] | Freq: Three times a day (TID) | INTRAMUSCULAR | Status: DC
Start: 2022-01-18 — End: 2022-01-29
  Administered 2022-01-18 – 2022-01-29 (×11): 5 [IU] via SUBCUTANEOUS
  Filled 2022-01-18 (×14): qty 1

## 2022-01-18 MED ORDER — INSULIN GLARGINE-YFGN 100 UNIT/ML ~~LOC~~ SOLN
8.0000 [IU] | Freq: Two times a day (BID) | SUBCUTANEOUS | Status: DC
  Administered 2022-01-18 – 2022-01-29 (×22): 8 [IU] via SUBCUTANEOUS
  Filled 2022-01-18 (×24): qty 0.08

## 2022-01-18 MED ORDER — ALBUMIN HUMAN 25 % IV SOLN
25.0000 g | Freq: Once | INTRAVENOUS | Status: AC
  Administered 2022-01-19: 25 g via INTRAVENOUS

## 2022-01-18 NOTE — Assessment & Plan Note (Addendum)
POA as evidenced by tachycardia, leukocytosis, AKI and transaminitis consistent with organ dysfunction and severe sepsis with suspected infection source being sacral wounds versus an abdominal source (CT abdomen pelvis negative for anything acute however).  MRSA PCR negative. Wound cultures grew few Staph aureus, rare Enterococcus faecalis. Initially treated with vancomycin/cefepime. Vanc stopped 6/4.  --Transition to p.o. Keflex to complete course, and then transition to meropenem as she became febrile again. -Repeat wound cultures with MSSA and staph epi which was resistant to methicillin. 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 -Skin biopsy was done on 6/14 by general surgery, preliminary results are negative for calciphylaxis.  Vasculitis can be a possibility, very nonspecific elevation of lupus anticoagulant, patient was on heparin.  Holding heparin for couple of days and then we will repeat blood test. -Sodium thiosulfate was given during dialysis on 6/14 for concern of calciphylaxis -Continue with meropenem -Continue with wound care -Follow final skin biopsy results

## 2022-01-18 NOTE — Progress Notes (Signed)
Multiple attempts made throughout the day to encourage patient to allow dressing changes that are ordered daily. Patient continuing to delay dressing changes for "later." Patient now requesting they be done "sometime next shift after I've ate my dinner, hopefully not soon."

## 2022-01-18 NOTE — Progress Notes (Signed)
[facility]  PROGRESS NOTE   Subjective:   Patient seen at bedside today.  Denies any acute complaints or shortness of breath.  Objective:  Vital signs: Blood pressure (!) 126/54, pulse 83, temperature 98.8 F (37.1 C), temperature source Oral, resp. rate 18, height 5\' 3"  (1.6 m), weight 104 kg, SpO2 96 %.  Intake/Output Summary (Last 24 hours) at 01/18/2022 1236 Last data filed at 01/17/2022 1300 Gross per 24 hour  Intake 240 ml  Output --  Net 240 ml    Filed Weights   01/16/22 1433 01/17/22 0500 01/18/22 0500  Weight: 104 kg 104 kg 104 kg     Physical Exam: General:  No acute distress  Head:  Normocephalic, atraumatic. Moist oral mucosal membranes  Eyes:  Anicteric  Lungs:   Clear to auscultation, normal effort  Heart:  S1S2 no rubs  Abdomen:   Soft, nontender, bowel sounds present  Extremities:  + peripheral edema.  Neurologic:  Awake, alert, following commands  Skin:  No lesions, sacral wound with NPWV  Access: RT Permcath    Basic Metabolic Panel: Recent Labs  Lab 01/12/22 1053 01/14/22 0406 01/16/22 0916  NA 134* 133* 133*  K 3.8 3.7 3.5  CL 95* 94* 96*  CO2 26 26 25   GLUCOSE 124* 195* 266*  BUN 70* 58* 74*  CREATININE 5.02* 4.82* 4.89*  CALCIUM 8.8* 9.3 9.4  PHOS 5.0* 4.0 3.9     CBC: Recent Labs  Lab 01/11/22 1752 01/12/22 1054 01/13/22 1117 01/14/22 0406 01/16/22 0916  WBC 11.3* 13.7*  13.6*  --  15.9* 11.1*  NEUTROABS  --  11.0*  --   --   --   HGB 7.9* 7.4*  7.4* 7.7* 7.8* 7.0*  HCT 25.2* 23.5*  23.2* 24.0* 24.8* 22.2*  MCV 93.7 94.0  92.4  --  93.9 92.1  PLT 295 270  255  --  254 253      Urinalysis: No results for input(s): "COLORURINE", "LABSPEC", "PHURINE", "GLUCOSEU", "HGBUR", "BILIRUBINUR", "KETONESUR", "PROTEINUR", "UROBILINOGEN", "NITRITE", "LEUKOCYTESUR" in the last 72 hours.  Invalid input(s): "APPERANCEUR"    Imaging: No results found.   Medications:    meropenem (MERREM) IV 500 mg (01/17/22  1836)   sodium thiosulfate 25 g in sodium chloride 0.9 % 200 mL Infusion for Calciphylaxis      (feeding supplement) PROSource Plus  30 mL Oral TID BM   vitamin C  500 mg Oral BID   Chlorhexidine Gluconate Cloth  6 each Topical Q0600   epoetin (EPOGEN/PROCRIT) injection  10,000 Units Intravenous Q M,W,F-HD   fluticasone  2 spray Each Nare Daily   furosemide  40 mg Oral Daily   gabapentin  100 mg Oral TID   insulin aspart  0-5 Units Subcutaneous QHS   insulin aspart  0-9 Units Subcutaneous TID WC   insulin aspart  5 Units Subcutaneous TID WC   insulin glargine-yfgn  8 Units Subcutaneous BID   leptospermum manuka honey  1 application  Topical Daily   lidocaine-EPINEPHrine  20 mL Intradermal Once   loratadine  10 mg Oral Daily   metoprolol succinate  50 mg Oral BID   multivitamin  1 tablet Oral QHS   nutrition supplement (JUVEN)  1 packet Oral BID BM    Assessment/ Plan:     Principal Problem:   Severe sepsis (HCC) Active Problems:   OSA (obstructive sleep apnea)   Chronic kidney disease, stage 5 ([place])   Essential hypertension   DNR (do not  resuscitate)/DNI(Do Not Intubate)   Type 2 diabetes mellitus with chronic kidney disease, with long-term current use of insulin (HCC)   Hypomagnesemia   Pressure injury of skin   Symptomatic anemia   Elevated LFTs   Sacral wound, initial encounter   Swelling of left lower extremity   Abdominal pain   AKI (acute kidney injury) ([place])   Hyponatremia   Premature atrial complexes   Anasarca associated with disorder of kidney   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  Patient is reached end-stage renal disease and was started on renal replacement therapy.   #: End-stage renal disease: Patient has a permacath   Tolerated her treatment on Friday.  Next treatment scheduled for Monday.       #: Sepsis/fever likely from sacral wound believed to be calciphylaxis: Currently prescribed meropenem.  Negative pressure wound VAC remains in place and managed by surgery team.  Sodium thiosulfate with dialysis treatments.    #: Anemia secondary to chronic kidney disease complicated by sepsis. Has received blood transfusions during this admission.  - EPO with dialysis treatments.   #: Secondary hyperparathyroidism: Calcium and phosphorus are at goal   # Diabetes with CKD Lab Results  Component Value Date   HGBA1C 10.0 (H) 12/18/2021   Poorly controlled Diabetes   2D echo from January 03, 2022-:LVEF 60 to 65%, no regional wall motion abnormalities, asymmetric left ventricular hypertrophy of the basal septal segment.  Diastolic parameters normal.    LOS: [facility] 6/18/202312:36 PM

## 2022-01-18 NOTE — Progress Notes (Signed)
Progress Note   Patient: Theresa Warren ZRA:[id number] DOB: 27-Nov-1955 DOA: 12/31/2021     17 DOS: the patient was seen and examined on 01/18/2022   Brief hospital course: Ms. Jadda Hunsucker is a 66 year old female hyperlipidemia, hypertension, CKD stage V, insulin-dependent diabetes mellitus, who presents emergency department from [facility] for evaluation of low sodium noted on labs at the facility.  She was discharged to rehab on 5/23 after being admitted following a fall and also with acute on chronic anemia.  ED Course - temp 99.6, HR 107, otherwise unremarkable vitals. Labs with sodium 129, potassium 5.1, chloride 99, bicarb 17, BUN of 100, serum creatinine of 4.61, GFR of 10, WBC 18.4, hemoglobin 6.9, platelets of 473.  LFT's were also elevated.  Patient met criteria for sepsis on arrival, treated per protocol in the ED with IV Cefepime, Flagyl, Vanc, and LR 1 L bolus fluids.  Infection source unclear but felt possibly due to sacral wound infection and concern for underlying osteomyelitis.  Admitted to the hospitalist service for further evaluation and management.  Continued on IV Cefepime and Vanc.   General surgery was consulted for debridement on sacral pressure injury.  Nephrology consulted due to worsening renal function.   Weeks summary from prior provider. Patient was lethargic and displayed uremic symptoms.  She was started on hemodialysis after tunneled dialysis catheter was placed by vascular surgery on 6/9.  First dialysis was started on 6/9.  In the evening she was febrile and has been spiking fevers for the past few days.  She was placed on IV meropenem broad-spectrum and ID was consulted.  Also she has left thigh induration wounds soft tissue ultrasound was negative and she was also negative for bilateral DVTs.  6/14: 2 separate wound cultures, 1 with MSSA and Enterococcus faecalis and second with MSSA and Staph epidermidis resistant to methicillin.  Antibiotics  were switched to meropenem.  ID is on board.  Patient had multiple wound debridements. 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 Autoimmune and vasculitis labs pending.  CK low at 10, acute hepatitis panel negative. Patient also has postmenopausal vaginal bleeding resulted in blood loss anemia, endometrial thickening on scan which will required outpatient gynecologic evaluation to rule out malignancy.  So far received 2 unit of PRBC.  6/15: Her wound continued to get worse.  Skin biopsy for concern of calciphylaxis was done by general surgery on 01/14/2022.  New pictures in ID note. ANA comprehensive profile was negative.  Rest of the autoimmune labs are pending.  6/16: ANCA profile negative.  Antiphospholipid antibody and skin biopsy is still pending. CBG elevated, increased Semglee to twice daily and added mealtime coverage. Preliminary skin biopsy reports with no calciphylaxis, some microthrombosis, slight sent to [facility] for dermatology department review. Multiple deep wounds and necrosis.  See today's surgery progress note for new pictures.  6/17: Patient antiphospholipid syndrome profile with negative anticardiolipin IgG and IgM but positive PTT lupus anticoagulant and DRVVT, on further characterization DRVVT mix was slightly elevated with normal DRVVT confirm.  Final skin biopsy results still pending.  6/18: CBG remained elevated, ongoing adjustment to her insulin.  Had a curbside consult with different rheumatologist and according to her recommendations she was suggesting stopping heparin for couple of days and repeat PTT lupus anticoagulant.  Patient has very nonspecific testing and does not make the cutoff for antiphospholipid syndrome.   Assessment and Plan: * Severe sepsis ([place]) POA as evidenced by tachycardia, leukocytosis, AKI and transaminitis consistent  with organ dysfunction and severe sepsis with suspected infection source being sacral wounds versus an abdominal  source (CT abdomen pelvis negative for anything acute however).  MRSA PCR negative. Wound cultures grew few Staph aureus, rare Enterococcus faecalis. Initially treated with vancomycin/cefepime. Vanc stopped 6/4.  --Transition to p.o. Keflex to complete course, and then transition to meropenem as she became febrile again. -Repeat wound cultures with MSSA and staph epi which was resistant to methicillin. 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 -Skin biopsy was done on 6/14 by general surgery, preliminary results are negative for calciphylaxis.  Vasculitis can be a possibility, very nonspecific elevation of lupus anticoagulant, patient was on heparin.  Holding heparin for couple of days and then we will repeat blood test. -Sodium thiosulfate was given during dialysis on 6/14 for concern of calciphylaxis -Continue with meropenem -Continue with wound care -Follow final skin biopsy results  Swelling of left lower extremity With redness, patient attributes it to clindamycin.  She reported on admission that this has been present for over one month.   Left lower extremity Doppler US on 12/18/21 was negative for DVT.  Monitor closely for now.  Sacral wound, initial encounter See above. Pictures in surgery note  Chronic kidney disease, stage 5 ([place]) Not transition to ESRD and patient was started on dialysis. -Continue with dialysis -Appreciate nephrology help  AKI (acute kidney injury) ([place]) Please see above as patient is now on dialysis.  Premature atrial complexes Patient became significantly tachycardic on 6/2 with heart rates sustaining in the 130s.  Cardiology reviewed telemetry and feels patient dysrhythmias were not A-fib but sinus tach with PACs. --Cardiology following -- Continue Lopressor 25 mg BID   Hypomagnesemia Resolved with replacement on 6/3. Monitor and replace Mg as needed.   Symptomatic anemia Hemoglobin on admission was 6.9 >> 6.3,  down from 7.6 at time of discharge on 5/23.  No signs of active bleeding, did have vaginal/endometrial bleeding previous admission but no reports of this recently.  May have slow chronic blood loss from sacral wounds.  Has baseline anemia of chronic disease due to renal failure. Received 2 unit PRBC transfusion . -Continue to monitor -Transfuse if below 7  Abdominal pain Patient reported on admission.  CT of abdomen pelvis showed abnormal endometrial thickening for a postmenopausal patient, bladder distention estimated 520 mL, no other acute or inflammatory processes or acute findings. --Monitor clinically for now -- Needs OB/GYN follow-up for endometrial sampling to exclude endometrial carcinoma  Hyponatremia Resolved.  Presented with sodium 129.  This was noted on labs at [facility] and was the reason patient presented to the ED. improved with IV hydration. -Continue to monitor  Essential hypertension Blood pressure mildly elevated. 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 -Skin biopsy obtained, preliminary results were negative for calciphylaxis   Anasarca associated with disorder of kidney Nephrology following. Treated with Lasix IV, later Lasix drip, IV albumin.  Nephrology switched back to PO Lasix today.  Monitor I/O's & Daily Wts  Elevated LFTs On admission, alk phos 212, AST 182, ALT 107.  Unclear etiology, repeat  LFTs not available today.  Appears LFTs were elevated at time of discharge on 5/23 but have  increased since. RUQ ultrasound unremarkable. Acute hepatitis panel negative. -- Follow CMP  Pressure injury of skin Present on admission.  Patient has multiple sacral pressure injuries.  Some concern at time of admission that these were the source of sepsis. General surgery consulted and took patient to the OR on 6/1 for debridement.   Now with wound VAC. --Initial wound VAC change Monday 6/5 then every M/W/F -- Wound care consulted -- Frequent repositioning --Follow general surgery recommendations --Pain control --Follow blood cultures Pressure Injury 01/01/22 Buttocks Left Stage 3 -  Full thickness tissue loss. Subcutaneous fat may be visible but bone, tendon or muscle are NOT exposed. (Active)  01/01/22 0222  Location: Buttocks  Location Orientation: Left  Staging: Stage 3 -  Full thickness tissue loss. Subcutaneous fat may be visible but bone, tendon or muscle are NOT exposed.  Wound Description (Comments):   Present on Admission: Yes     Pressure Injury 01/01/22 Buttocks Right Stage 2 -  Partial thickness loss of dermis presenting as a shallow open injury with a red, pink wound bed without slough. (Active)  01/01/22 0223  Location: Buttocks  Location Orientation: Right  Staging: Stage 2 -  Partial thickness loss of dermis presenting as a shallow open injury with a red, pink wound bed without slough.  Wound Description (Comments):   Present on Admission: Yes     Pressure Injury 01/01/22 Hip Left Stage 2 -  Partial thickness loss of dermis presenting as a shallow open injury with a red, pink wound bed without slough. (Active)  01/01/22 0225  Location: Hip  Location Orientation: Left  Staging: Stage 2 -  Partial thickness loss of dermis presenting as a shallow open injury with a red, pink wound bed without slough.  Wound Description (Comments):   Present on Admission: Yes     Pressure Injury 01/01/22 Hip Right Stage 3 -  Full thickness tissue loss. Subcutaneous fat may be  visible but bone, tendon or muscle are NOT exposed. (Active)  01/01/22 0225  Location: Hip  Location Orientation: Right  Staging: Stage 3 -  Full thickness tissue loss. Subcutaneous fat may be visible but bone, tendon or muscle are NOT exposed.  Wound Description (Comments):   Present on Admission: Yes      OSA (obstructive sleep apnea) .  Appears not to be on CPAP     Subjective: Patient was seen and examined today.  She was having 6 out of 10 pain on right hip.  Bandages were changed yesterday.  Physical Exam: Vitals:   01/17/22 2131 01/18/22 0455 01/18/22 0500 01/18/22 1006  BP: (!) 121/55 (!) 135/58  (!) 126/54  Pulse: 83 79  83  Resp: (!) 22 18  18   Temp: 99 F (37.2 C) 99.4 F (37.4 C)  98.8 F (37.1 C)  TempSrc:    Oral  SpO2: 97% 98%  96%  Weight:   104 kg   Height:       General.  Obese lady, in no acute distress. Pulmonary.  Lungs clear bilaterally, normal respiratory effort. CV.  Regular rate and rhythm, no JVD, rub or murmur. Abdomen.  Soft, nontender, nondistended, BS positive. CNS.  Alert and oriented .  No focal neurologic deficit. Extremities.  Trace LE edema, no cyanosis, pulses intact and symmetrical.  Multiple bandages on bilateral buttocks, hips and thigh regions Psychiatry.  Judgment and insight appears normal.  Data Reviewed: Prior notes and labs reviewed  Family Communication: Discussed with patient  Disposition: Status is: Inpatient Remains inpatient appropriate because: Severity of illness   Planned Discharge Destination: Skilled nursing facility  DVT prophylaxis.  SCDs Time spent: 45 minutes  This record has been created using Systems analyst. Errors have been sought and corrected,but may not always be located. Such creation errors do not reflect on the standard of care.  Author: Lorella Nimrod, MD 01/18/2022 12:16 PM  For on call review [email or web address].

## 2022-01-18 NOTE — Assessment & Plan Note (Signed)
With redness, patient attributes it to clindamycin.  She reported on admission that this has been present for over one month.   Left lower extremity Doppler US on 12/18/21 was negative for DVT.  Monitor closely for now.

## 2022-01-18 NOTE — Assessment & Plan Note (Signed)
See above. Pictures in surgery note

## 2022-01-18 NOTE — Assessment & Plan Note (Signed)
CBG remained elevated, at 321 this morning -Increase Semglee to 8 units twice daily -Increase mealtime coverage to 5 units -Continue with SSI

## 2022-01-19 DIAGNOSIS — A419 Sepsis, unspecified organism: Secondary | ICD-10-CM | POA: Diagnosis not present

## 2022-01-19 DIAGNOSIS — R6520 Severe sepsis without septic shock: Secondary | ICD-10-CM | POA: Diagnosis not present

## 2022-01-19 DIAGNOSIS — R52 Pain, unspecified: Secondary | ICD-10-CM | POA: Diagnosis not present

## 2022-01-19 DIAGNOSIS — N186 End stage renal disease: Secondary | ICD-10-CM | POA: Diagnosis not present

## 2022-01-19 LAB — GLUCOSE, CAPILLARY
Glucose-Capillary: 156 mg/dL — ABNORMAL HIGH (ref 70–99)
Glucose-Capillary: 203 mg/dL — ABNORMAL HIGH (ref 70–99)
Glucose-Capillary: 189 mg/dL — ABNORMAL HIGH (ref 70–99)

## 2022-01-19 MED ORDER — POVIDONE-IODINE 5 % EX SOLN: Freq: Every day | CUTANEOUS | Status: DC

## 2022-01-19 MED ORDER — HEPARIN SODIUM (PORCINE) 1000 UNIT/ML IJ SOLN
INTRAMUSCULAR | Status: AC
  Administered 2022-01-19: 10000 [IU]
  Filled 2022-01-19: qty 10

## 2022-01-19 MED ORDER — EPOETIN ALFA 10000 UNIT/ML IJ SOLN
INTRAMUSCULAR | Status: AC
  Filled 2022-01-19: qty 1

## 2022-01-19 MED ORDER — MORPHINE SULFATE (PF) 2 MG/ML IV SOLN
INTRAVENOUS | Status: AC
  Filled 2022-01-19: qty 1

## 2022-01-19 MED ORDER — ACETAMINOPHEN 325 MG PO TABS
ORAL_TABLET | ORAL | Status: AC
  Filled 2022-01-19: qty 2

## 2022-01-19 MED ORDER — SODIUM CHLORIDE 0.9 % IV SOLN: INTRAVENOUS | Status: DC | PRN

## 2022-01-19 MED ORDER — ALBUMIN HUMAN 25 % IV SOLN
INTRAVENOUS | Status: AC
  Filled 2022-01-19: qty 50

## 2022-01-19 NOTE — Consult Note (Cosign Needed)
Consultation Note Date: 01/19/2022   Patient Name: Theresa Warren  DOB: 10-18-1955  MRN: [id number]  Age / Sex: 66 y.o., female  PCP: Crist Infante, MD Referring Physician: Lorella Nimrod, MD  Reason for Consultation: Pain control  HPI/Patient Profile: Theresa Warren is a 66 year old female hyperlipidemia, hypertension, CKD stage V, insulin-dependent diabetes mellitus, who presents emergency department from [facility] for evaluation of low sodium noted on labs at the facility.  She was discharged to rehab on 5/23 after being admitted following a fall and also with acute on chronic anemia.  Clinical Assessment and Goals of Care: Consult noted for pain management. Notes and labs reviewed independently. In to see patient. She is very tired as she had dialysis earlier today. This has just begun during this admission. She tells me she feels she is doing okay with it.   She tells me she has pain in her low back, buttocks, and in her hips; she tells me they are sore. Discussed turning in bed to help discomfort and redistribute weight and pressure. Needs to be working on mobility daily.     Patient with current order for: Oxycodone 5-10mg  PO q 4 hrs PRN  Morphine 2mg  IV q 4 hrs PRN.  Dilaudid 1mg  IV 1 time dose PRN: Not given  24 hour use of Oxycodone:  6/19 at 05:30 am 10mg  and 13:32pm 10mg .  Prior dose before 6/19 was dated 6/17.  24 hour use of Morphine:  6/19 at 11:31am. Previous dose was on 6/16.   SUMMARY OF RECOMMENDATIONS   Would recommend maximizing currently ordered PRN medications for pain. To minimize peaks and troughs, would recommend providing medication daily, and at lower doses with increased frequency, in place of higher doses with less frequency.       Primary Diagnoses: Present on Admission:  Symptomatic anemia  Elevated LFTs  Severe sepsis (HCC)  OSA (obstructive  sleep apnea)  DNR (do not resuscitate)/DNI(Do Not Intubate)  Essential hypertension  Sacral wound, initial encounter  Swelling of left lower extremity  Abdominal pain  Chronic kidney disease, stage 5 (HCC)  AKI (acute kidney injury) ([place])  Hyponatremia  Pressure injury of skin  Premature atrial complexes  Hypomagnesemia   I have reviewed the medical record, interviewed the patient and family, and examined the patient. The following aspects are pertinent.  Past Medical History:  Diagnosis Date   CKD (chronic kidney disease), stage III ([place])    Diabetes ([place])    Hyperlipidemia    Hypertension    Obesity    Social History   Socioeconomic History   Marital status: Divorced    Spouse name: Not on file   Number of children: Not on file   Years of education: Not on file   Highest education level: Not on file  Occupational History   Occupation: Works [employer]  Tobacco Use   Smoking status: Never   Smokeless tobacco: Not on file  Substance and Sexual Activity   Alcohol use: Not Currently   Drug use:  Not Currently   Sexual activity: Not Currently  Other Topics Concern   Not on file  Social History Narrative   Lives alone   Social Determinants of Health   Financial Resource Strain: Not on file  Food Insecurity: Not on file  Transportation Needs: Not on file  Physical Activity: Not on file  Stress: Not on file  Social Connections: Not on file   Family History  Problem Relation Age of Onset   Alzheimer's disease Father    Diabetes Father    CAD Father 2   CAD Other        Multliple maternal aunts and uncles with early onset heart disease   Lung cancer Sister    ALS Mother    Scheduled Meds:  (feeding supplement) PROSource Plus  30 mL Oral TID BM   vitamin C  500 mg Oral BID   Chlorhexidine Gluconate Cloth  6 each Topical Q0600   epoetin (EPOGEN/PROCRIT) injection  10,000 Units Intravenous Q M,W,F-HD   fluticasone  2 spray Each Nare Daily   furosemide   40 mg Oral Daily   gabapentin  100 mg Oral TID   insulin aspart  0-5 Units Subcutaneous QHS   insulin aspart  0-9 Units Subcutaneous TID WC   insulin aspart  5 Units Subcutaneous TID WC   insulin glargine-yfgn  8 Units Subcutaneous BID   leptospermum manuka honey  1 application  Topical Daily   lidocaine-EPINEPHrine  20 mL Intradermal Once   loratadine  10 mg Oral Daily   metoprolol succinate  50 mg Oral BID   morphine (PF)       multivitamin  1 tablet Oral QHS   nutrition supplement (JUVEN)  1 packet Oral BID BM   Continuous Infusions:  meropenem (MERREM) IV Stopped (01/18/22 1757)   sodium thiosulfate 25 g in sodium chloride 0.9 % 200 mL Infusion for Calciphylaxis Stopped (01/19/22 1305)   PRN Meds:.acetaminophen, alteplase, heparin, hydrALAZINE, HYDROmorphone (DILAUDID) injection, lidocaine (PF), lidocaine-prilocaine, morphine injection, morphine (PF), Muscle Rub, ondansetron **OR** ondansetron (ZOFRAN) IV, ondansetron (ZOFRAN) IV, oxyCODONE, pentafluoroprop-tetrafluoroeth Medications Prior to Admission:  Prior to Admission medications   Medication Sig Start Date End Date Taking? Authorizing Provider  allopurinol (ZYLOPRIM) 100 MG tablet Take 100 mg by mouth daily.    [provider]  atorvastatin (LIPITOR) 40 MG tablet Take 40 mg by mouth daily.    [provider]  fexofenadine (ALLEGRA) 180 MG tablet Take 180 mg by mouth daily.    [provider]  fluticasone (FLONASE) 50 MCG/ACT nasal spray Place 2 sprays into both nostrils daily. 11/27/14 12/19/21  Vilinda Boehringer, MD  furosemide (LASIX) 20 MG tablet Take 20 mg by mouth daily.    [provider]  HUMULIN R 500 UNIT/ML SOLN injection Inject 25-30 Units into the skin 2 (two) times daily with a meal. Patient reports she uses Humulin R U500 vials and U100 insulin syringe. Draws up to 30 unit mark on U100 syringe QAM, Humulin R U500 25 unit mark on U100 syringe QPM 07/10/14   [provider]   losartan (COZAAR) 50 MG tablet Take 1 tablet (50 mg total) by mouth daily. 12/24/21   Sharen Hones, MD  metoprolol tartrate (LOPRESSOR) 25 MG tablet Take 1 tablet (25 mg total) by mouth 2 (two) times daily. 12/23/21   Sharen Hones, MD  nystatin (MYCOSTATIN/NYSTOP) powder Apply topically. 10/28/21   [provider]  Omega-3 Fatty Acids (FISH OIL) 500 MG CAPS Take by mouth.  [provider]  sodium bicarbonate 650 MG tablet Take 1 tablet (650 mg total) by mouth 2 (two) times daily. 12/23/21 12/23/22  Sharen Hones, MD  triamcinolone cream (KENALOG) 0.1 % Apply 1 application. topically 3 (three) times daily. 11/12/21   [provider]  Vitamin D, Ergocalciferol, (DRISDOL) 50000 UNITS CAPS capsule Take 1 capsule by mouth 2 (two) times a week. 07/22/14   [provider]   Allergies  Allergen Reactions   Codeine Nausea And Vomiting   Sulfa Antibiotics Swelling   Clindamycin/Lincomycin Rash   Penicillins Rash   Review of Systems  Constitutional:        Complains of pain.     Physical Exam Pulmonary:     Effort: Pulmonary effort is normal.  Neurological:     Mental Status: She is alert.     Vital Signs: BP 121/60 (BP Location: Right Arm)   Pulse 78   Temp 98.1 F (36.7 C)   Resp 17   Ht 5\' 3"  (1.6 m)   Wt 109 kg   SpO2 97%   BMI 42.57 kg/m  Pain Scale: 0-10 POSS *See Group Information*: S-Acceptable,Sleep, easy to arouse Pain Score: 10-Worst pain ever   SpO2: SpO2: 97 % O2 Device:SpO2: 97 % O2 Flow Rate: .O2 Flow Rate (L/min): 3 L/min  IO: Intake/output summary:  Intake/Output Summary (Last 24 hours) at 01/19/2022 1538 Last data filed at 01/19/2022 1301 Gross per 24 hour  Intake 562.27 ml  Output 1200 ml  Net -637.73 ml    LBM: Last BM Date : 01/18/22 Baseline Weight: Weight: 97 kg Most recent weight: Weight: 109 kg      Signed by: Asencion Gowda, NP   Please contact Palliative Medicine Team phone at [phone] for questions and  concerns.  For individual provider: See Shea Evans

## 2022-01-19 NOTE — Progress Notes (Signed)
Progress Note   Patient: Theresa Warren OVZ:[id number] DOB: Apr 15, 1956 DOA: 12/31/2021     18 DOS: the patient was seen and examined on 01/19/2022   Brief hospital course: Theresa Warren is a 66 year old female hyperlipidemia, hypertension, CKD stage V, insulin-dependent diabetes mellitus, who presents emergency department from [facility] for evaluation of low sodium noted on labs at the facility.  She was discharged to rehab on 5/23 after being admitted following a fall and also with acute on chronic anemia.  ED Course - temp 99.6, HR 107, otherwise unremarkable vitals. Labs with sodium 129, potassium 5.1, chloride 99, bicarb 17, BUN of 100, serum creatinine of 4.61, GFR of 10, WBC 18.4, hemoglobin 6.9, platelets of 473.  LFT's were also elevated.  Patient met criteria for sepsis on arrival, treated per protocol in the ED with IV Cefepime, Flagyl, Vanc, and LR 1 L bolus fluids.  Infection source unclear but felt possibly due to sacral wound infection and concern for underlying osteomyelitis.  Admitted to the hospitalist service for further evaluation and management.  Continued on IV Cefepime and Vanc.   General surgery was consulted for debridement on sacral pressure injury.  Nephrology consulted due to worsening renal function.   Weeks summary from prior provider. Patient was lethargic and displayed uremic symptoms.  She was started on hemodialysis after tunneled dialysis catheter was placed by vascular surgery on 6/9.  First dialysis was started on 6/9.  In the evening she was febrile and has been spiking fevers for the past few days.  She was placed on IV meropenem broad-spectrum and ID was consulted.  Also she has left thigh induration wounds soft tissue ultrasound was negative and she was also negative for bilateral DVTs.  6/14: 2 separate wound cultures, 1 with MSSA and Enterococcus faecalis and second with MSSA and Staph epidermidis resistant to methicillin.  Antibiotics  were switched to meropenem.  ID is on board.  Patient had multiple wound debridements. 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 Autoimmune and vasculitis labs pending.  CK low at 10, acute hepatitis panel negative. Patient also has postmenopausal vaginal bleeding resulted in blood loss anemia, endometrial thickening on scan which will required outpatient gynecologic evaluation to rule out malignancy.  So far received 2 unit of PRBC.  6/15: Her wound continued to get worse.  Skin biopsy for concern of calciphylaxis was done by general surgery on 01/14/2022.  New pictures in ID note. ANA comprehensive profile was negative.  Rest of the autoimmune labs are pending.  6/16: ANCA profile negative.  Antiphospholipid antibody and skin biopsy is still pending. CBG elevated, increased Semglee to twice daily and added mealtime coverage. Preliminary skin biopsy reports with no calciphylaxis, some microthrombosis, slight sent to [facility] for dermatology department review. Multiple deep wounds and necrosis.  See today's surgery progress note for new pictures.  6/17: Patient antiphospholipid syndrome profile with negative anticardiolipin IgG and IgM but positive PTT lupus anticoagulant and DRVVT, on further characterization DRVVT mix was slightly elevated with normal DRVVT confirm.  Final skin biopsy results still pending.  6/18: CBG remained elevated, ongoing adjustment to her insulin.  Had a curbside consult with different rheumatologist and according to her recommendations she was suggesting stopping heparin for couple of days and repeat PTT lupus anticoagulant.  Patient has very nonspecific testing and does not make the cutoff for antiphospholipid syndrome.  6/19: Per nursing staff patient refusing daily dressing change and keep delaying which interferes with their workflow.  Holding heparin and we can repeat PTT lupus anticoagulant tomorrow before restarting heparin for DVT prophylaxis.   Final skin biopsy results are still pending Continues to have significant pain-palliative care was consulted for pain management. Pulm nephrology if she cannot sit in a dialysis chair, they might not be able to offer dialysis for a long time.   Assessment and Plan: * Severe sepsis ([place]) POA as evidenced by tachycardia, leukocytosis, AKI and transaminitis consistent with organ dysfunction and severe sepsis with suspected infection source being sacral wounds versus an abdominal source (CT abdomen pelvis negative for anything acute however).  MRSA PCR negative. Wound cultures grew few Staph aureus, rare Enterococcus faecalis. Initially treated with vancomycin/cefepime. Vanc stopped 6/4.  --Transition to p.o. Keflex to complete course, and then transition to meropenem as she became febrile again. -Repeat wound cultures with MSSA and staph epi which was resistant to methicillin. 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 -Skin biopsy was done on 6/14 by general surgery, preliminary results are negative for calciphylaxis.  Vasculitis can be a possibility, very nonspecific elevation of lupus anticoagulant, patient was on heparin.  Holding heparin for couple of days and then we will repeat blood tomorrow -Sodium thiosulfate was given during dialysis on 6/14 for concern of calciphylaxis -Continue with meropenem -Continue with wound care -Follow final skin biopsy results -Palliative care consulted for pain management  Swelling of left lower extremity Improving Initially with redness, patient attributes it to clindamycin.  She reported on admission that this has been present for over one month.   Left lower extremity Doppler US on 12/18/21 was negative for DVT.  Monitor closely for now.  Sacral wound, initial encounter See above. Pictures in surgery note  Chronic kidney disease, stage 5 ([place]) Not transition to ESRD and patient was started on dialysis. -Continue  with dialysis -Appreciate nephrology help  AKI (acute kidney injury) ([place]) Please see above as patient is now on dialysis.  Premature atrial complexes Patient became significantly tachycardic on 6/2 with heart rates sustaining in the 130s.  Cardiology reviewed telemetry and feels patient dysrhythmias were not A-fib but sinus tach with PACs. --Cardiology following -- Continue Lopressor 25 mg BID   Hypomagnesemia Resolved with replacement on 6/3. Monitor and replace Mg as needed.   Symptomatic anemia Hemoglobin on admission was 6.9 >> 6.3, down from 7.6 at time of discharge on 5/23.  No signs of active bleeding, did have vaginal/endometrial bleeding previous admission but no reports of this recently.  May have slow chronic blood loss from sacral wounds.  Has baseline anemia of chronic disease due to renal failure. Received 2 unit PRBC transfusion . -Continue to monitor -Transfuse if below 7  Abdominal pain Patient reported on admission.  CT of abdomen pelvis showed abnormal endometrial thickening for a postmenopausal patient, bladder distention estimated 520 mL, no other acute or inflammatory processes or acute findings. --Monitor clinically for now -- Needs OB/GYN follow-up for endometrial sampling to exclude endometrial carcinoma  Hyponatremia Resolved.  Presented with sodium 129.  This was noted on labs at [facility] and was the reason patient presented to the ED. improved with IV hydration. -Continue to monitor  Essential hypertension Blood pressure mildly elevated. 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 -Skin biopsy obtained, preliminary results were negative for calciphylaxis   Anasarca associated with disorder of kidney Nephrology following. Treated with Lasix IV, later Lasix drip, IV albumin.  Nephrology switched back to PO Lasix today.  Monitor I/O's & Daily Wts  Elevated LFTs On admission, alk phos 212, AST 182, ALT 107.  Unclear etiology, repeat LFTs not available today.  Appears LFTs were elevated at time of discharge on 5/23 but have increased since. RUQ ultrasound unremarkable. Acute hepatitis panel negative. -- Follow CMP  Pressure injury of skin Present on admission.  Patient has multiple sacral pressure injuries.  Some concern at time of admission that these were the source of sepsis. General surgery consulted and took patient to the OR on 6/1 for debridement.   Now with wound VAC. --Initial wound VAC change Monday 6/5 then every M/W/F -- Wound care consulted -- Frequent repositioning --Follow general surgery recommendations --Pain control --Follow blood cultures Pressure Injury 01/01/22 Buttocks Left Stage 3 -  Full thickness tissue loss. Subcutaneous fat may be visible but bone, tendon or muscle are NOT exposed. (Active)  01/01/22 0222  Location: Buttocks  Location Orientation: Left  Staging: Stage 3 -  Full thickness tissue loss. Subcutaneous fat may be visible but bone, tendon or muscle are NOT exposed.  Wound Description (Comments):   Present on Admission: Yes     Pressure Injury 01/01/22 Buttocks Right Stage 2 -  Partial thickness loss of dermis presenting as a shallow open injury with a red, pink wound bed without slough. (Active)  01/01/22 0223  Location: Buttocks  Location Orientation: Right  Staging: Stage 2 -  Partial thickness loss of dermis presenting as a shallow open injury with a red, pink wound bed without slough.  Wound Description (Comments):    Present on Admission: Yes     Pressure Injury 01/01/22 Hip Left Stage 2 -  Partial thickness loss of dermis presenting as a shallow open injury with a red, pink wound bed without slough. (Active)  01/01/22 0225  Location: Hip  Location Orientation: Left  Staging: Stage 2 -  Partial thickness loss of dermis presenting as a shallow open injury with a red, pink wound bed without slough.  Wound Description (Comments):   Present on Admission: Yes     Pressure Injury 01/01/22 Hip Right Stage 3 -  Full thickness tissue loss. Subcutaneous fat may be visible but bone, tendon or muscle are NOT exposed. (Active)  01/01/22 0225  Location: Hip  Location Orientation: Right  Staging: Stage 3 -  Full thickness tissue loss. Subcutaneous fat may be visible but bone, tendon or muscle are NOT exposed.  Wound Description (Comments):   Present on Admission: Yes      OSA (obstructive sleep apnea) .  Appears not to be on CPAP     Subjective: Patient was complaining of significant pain involving both buttocks and hips.  She was seen during dialysis and asking for pain medications.  Encouraged her to cooperate with wound care nurses and have her dressing changed as recommended.  Physical Exam: Vitals:   01/19/22 1200 01/19/22 1215 01/19/22 1230 01/19/22 1310  BP: (!) 103/46 (!) 133/47 (!) 134/56 121/60  Pulse: 69 61 66 78  Resp: (!) 24 (!) _0 Temp:   98.8 F (37.1 C) 98.1 F (36.7 C)  TempSrc:  Oral   SpO2:   94% 97%  Weight:   109 kg   Height:       General.     In no acute distress.  Multiple wounds involving bilateral buttock, hip and thighs Pulmonary.  Lungs clear bilaterally, normal respiratory effort. CV.  Regular rate and rhythm, no JVD, rub or murmur. Abdomen.  Soft, nontender, nondistended, BS positive. CNS.  Alert and oriented .  No focal neurologic deficit. Extremities.  No edema, no cyanosis, pulses intact and symmetrical. Psychiatry.  Judgment and insight appears  normal.  Data Reviewed: Prior records reviewed  Family Communication:   Disposition: Status is: Inpatient Remains inpatient appropriate because: Severity of illness   Planned Discharge Destination: Skilled nursing facility  Time spent: 48 minutes  This record has been created using Systems analyst. Errors have been sought and corrected,but may not always be located. Such creation errors do not reflect on the standard of care.  Author: Lorella Nimrod, MD 01/19/2022 3:08 PM  For on call review [email or web address].

## 2022-01-19 NOTE — TOC Progression Note (Signed)
Transition of Care [facility]) - Progression Note    Patient Details  Name: Theresa Warren MRN: [id number] Date of Birth: 1955/11/07  [facility] CM/SW [place], RN Phone Number: 01/19/2022, 8:46 AM  Clinical Narrative:     TOC to continue to follow the patient for needs, When medically ready will review bed choices  Expected Discharge Plan:  (TBD) Barriers to Discharge: Continued Medical Work up  Expected Discharge Plan and Services Expected Discharge Plan:  (TBD)   Discharge Planning Services: CM Consult Post Acute Care Choice:  (TBD) Living arrangements for the past 2 months: Single Family Home                                       Social Determinants of Health (SDOH) Interventions    Readmission Risk Interventions     No data to display

## 2022-01-19 NOTE — Assessment & Plan Note (Signed)
Improving Initially with redness, patient attributes it to clindamycin.  She reported on admission that this has been present for over one month.   Left lower extremity Doppler US on 12/18/21 was negative for DVT.  Monitor closely for now.

## 2022-01-19 NOTE — Progress Notes (Signed)
PT Cancellation Note  Patient Details Name: Theresa Warren MRN: [id number] DOB: 1955/09/19   Cancelled Treatment:    Reason Eval/Treat Not Completed: Other (comment).  Attempted to initiate therapy session with pt but then nurse arrived and reported MD requesting to talk with pt (and brought pt phone with MD on).  Will re-attempt PT session at a later date/time.  Leitha Bleak, PT 01/19/22, 3:55 PM

## 2022-01-19 NOTE — Progress Notes (Signed)
[facility]  PROGRESS NOTE   Subjective:   Patient seen and evaluated during dialysis   HEMODIALYSIS FLOWSHEET:  Blood Flow Rate (mL/min): 400 mL/min Arterial Pressure (mmHg): -170 mmHg Venous Pressure (mmHg): 130 mmHg Transmembrane Pressure (mmHg): 60 mmHg Ultrafiltration Rate (mL/min): 700 mL/min Dialysate Flow Rate (mL/min): 600 ml/min Conductivity: 13.9 Conductivity: 13.9 Dialysis Fluid Bolus: Normal Saline Bolus Amount (mL): 250 mL  Complaining of sacral and left hip pain.  Objective:  Vital signs: Blood pressure 120/70, pulse 85, temperature 100.2 F (37.9 C), temperature source Oral, resp. rate 10, height 5\' 3"  (1.6 m), weight 112 kg, SpO2 97 %. No intake or output data in the 24 hours ending 01/19/22 1158  Filed Weights   01/18/22 0500 01/19/22 0500 01/19/22 0921  Weight: 104 kg 104 kg 112 kg     Physical Exam: General:  No acute distress  Head:  Normocephalic, atraumatic. Moist oral mucosal membranes  Eyes:  Anicteric  Lungs:   Clear to auscultation, normal effort  Heart:  S1S2 no rubs  Abdomen:   Soft, nontender, bowel sounds present  Extremities:  + peripheral edema.  Neurologic:  Awake, alert, following commands  Skin:  No lesions, sacral wound with NPWV  Access: RT Permcath    Basic Metabolic Panel: Recent Labs  Lab 01/14/22 0406 01/16/22 0916  NA 133* 133*  K 3.7 3.5  CL 94* 96*  CO2 26 25  GLUCOSE 195* 266*  BUN 58* 74*  CREATININE 4.82* 4.89*  CALCIUM 9.3 9.4  PHOS 4.0 3.9     CBC: Recent Labs  Lab 01/13/22 1117 01/14/22 0406 01/16/22 0916  WBC  --  15.9* 11.1*  HGB 7.7* 7.8* 7.0*  HCT 24.0* 24.8* 22.2*  MCV  --  93.9 92.1  PLT  --  254 253      Urinalysis: No results for input(s): "COLORURINE", "LABSPEC", "PHURINE", "GLUCOSEU", "HGBUR", "BILIRUBINUR", "KETONESUR", "PROTEINUR", "UROBILINOGEN", "NITRITE", "LEUKOCYTESUR" in the last 72 hours.  Invalid input(s): "APPERANCEUR"    Imaging: No results  found.   Medications:    meropenem (MERREM) IV 500 mg (01/18/22 1711)   sodium thiosulfate 25 g in sodium chloride 0.9 % 200 mL Infusion for Calciphylaxis 25 g (01/19/22 1118)    (feeding supplement) PROSource Plus  30 mL Oral TID BM   vitamin C  500 mg Oral BID   Chlorhexidine Gluconate Cloth  6 each Topical Q0600   epoetin (EPOGEN/PROCRIT) injection  10,000 Units Intravenous Q M,W,F-HD   fluticasone  2 spray Each Nare Daily   furosemide  40 mg Oral Daily   gabapentin  100 mg Oral TID   insulin aspart  0-5 Units Subcutaneous QHS   insulin aspart  0-9 Units Subcutaneous TID WC   insulin aspart  5 Units Subcutaneous TID WC   insulin glargine-yfgn  8 Units Subcutaneous BID   leptospermum manuka honey  1 application  Topical Daily   lidocaine-EPINEPHrine  20 mL Intradermal Once   loratadine  10 mg Oral Daily   metoprolol succinate  50 mg Oral BID   morphine (PF)       multivitamin  1 tablet Oral QHS   nutrition supplement (JUVEN)  1 packet Oral BID BM   povidone-Iodine   Topical Daily    Assessment/ Plan:     Principal Problem:   Severe sepsis (HCC) Active Problems:   OSA (obstructive sleep apnea)   Chronic kidney disease, stage 5 ([place])   Essential hypertension   DNR (do not resuscitate)/DNI(Do Not Intubate)  Type 2 diabetes mellitus with chronic kidney disease, with long-term current use of insulin (HCC)   Hypomagnesemia   Pressure injury of skin   Symptomatic anemia   Elevated LFTs   Sacral wound, initial encounter   Swelling of left lower extremity   Abdominal pain   AKI (acute kidney injury) ([place])   Hyponatremia   Premature atrial complexes   Anasarca associated with disorder of kidney   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  Patient is reached end-stage renal disease and was started on renal replacement  therapy.   2D echo from January 03, 2022-:LVEF 60 to 65%, no regional wall motion abnormalities, asymmetric left ventricular hypertrophy of the basal septal segment.  Diastolic parameters normal.   #: End-stage renal disease: Patient has a permacath   Receiving treatment today, UF goal 1 to 1.5 L as tolerated.  Concern remains if patient will be able to tolerate sitting in chair for outpatient dialysis.  Recommend palliative care for goals of care discussion.  Next treatment scheduled for Wednesday.     #: Sepsis/fever likely from sacral wound believed to be calciphylaxis: Currently prescribed meropenem.  Negative pressure wound VAC remains in place and managed by surgery team.  We will continue sodium thiosulfate with dialysis treatments.   #: Anemia secondary to chronic kidney disease complicated by sepsis. Has received blood transfusions during this admission.  -We will obtain updated CBC with a.m. labs.  Continue EPO with dialysis treatments.   #: Secondary hyperparathyroidism:  We will obtain updated labs in a.m.  # Diabetes with CKD Lab Results  Component Value Date   HGBA1C 10.0 (H) 12/18/2021   Poorly controlled Diabetes Glucose remains elevated, primary team to manage SSI    LOS: [facility] 6/19/202311:58 AM

## 2022-01-19 NOTE — Progress Notes (Signed)
Hemodialysis Post Treatment Note  19 January 2022  Access: Right Subclavian Catheter  BFR: 400   UF Removed: 1.2 liters  Next Scheduled Treatment : 01/21/22  Note:  Patient presents to treatment without new concerns, intact CVC, slightly elevated temp.   Patient tolerates hemodialysis treatment without difficulty, maintaining the prescribed BFR, Targeted UF reached Dressing changed to CVC, no signs of infection. Patient with ongoing pain to buttocks, PRN pain medication given with some immediate relief.   Report given to primary nurse, patient transported to assigned room.

## 2022-01-19 NOTE — Progress Notes (Signed)
Brief Progress Note Patient chart reviewed.  Unfortunately patient in dialysis at time of my attempted evaluation with [place] RN No new labs Still awaiting final pathology results on skin biopsy; does not appear to be calciphylaxis but this has been sent out for confirmation   Spent extensive time this morning reviewing patient case and current wound care with Val Riles, [place] RN. We reviewed barriers to wound healing including poor functional status, malnutrition, significant comorbidities. Reviewed previous discussions with wound care team regarding wound vac, status of the wound, difficulty with pain control, etc. Skin biopsy pending to rule out calciphylaxis; will not debride further until certain it is not this. After our discussion, we agreed to trial betadine moistened gauze dressing in attempts to dry out wounds and decrease microbial contamination. If she has a hard time with these, we will transition back to wound vac on Wednesday 06/21 to minimize number of changes.   Again, patient is out of room (likely in dialysis), and I will be in OR this afternoon and unable to assess in person at time of vac change.   Additionally, I do think we need to involve palliative care (if not done so already) to determine goals of care and how aggressive she would like to be. If her biopsy is negative for calciphylaxis, we can certainly continue to debride these wounds but we will likely continue to face the same barriers to wound healing. Very difficult situation.   Please call with questions/concerns. I will be available for wound care assessment on Wednesday 06/21.   -- Edison Simon, PA-C [facility] 01/19/2022, 9:16 AM M-F: 7am - 4pm

## 2022-01-19 NOTE — Consult Note (Signed)
[place] Nurse wound follow up Patient back to room 127 in [facility]. Wound type: unknown etiology at this time All of the black foam pieces were removed from the buttock wounds. 7.5% betadine moistened 4 x 4 gauzes (total of 2 tubs worth) were placed into and over the wounds on the buttocks. Then multiple ABD pads were placed to secure the 4 x 4s and taped in place with paper tape.    The patient whimpered very slightly, but she tolerated the procedure beautifully.  She was able to turn herself to her right side independently.  She verbalized her understanding for the wound therapy change.  I have placed the patient on the [place] f/u list for re-evaluation on Wednesday of this week. Val Riles, RN, MSN, CWOCN, CNS-BC, pager [phone]

## 2022-01-19 NOTE — Consult Note (Signed)
Pharmacy Antibiotic Note  Theresa Warren is a 66 y.o. female admitted on 12/31/2021 with ESRD and a gluteal abscess wound infection.  Pharmacy has been consulted for meropenem dosing.  -Hemodialysis pt  Plan: Day 8- meropenem 500mg  IV q24h in evening after Hemodialysis    6/12: D2 abx w/ anaerobic coverage for E.Faecailis/MSSA gluteal wound. MD requests escalation to meropenem given patient continues to spike fevers on antibiotic therapy    Height: 5\' 3"  (160 cm) Weight: 109 kg (240 lb 4.8 oz) IBW/kg (Calculated) : 52.4  Temp (24hrs), Avg:98.9 F (37.2 C), Min:98.1 F (36.7 C), Max:100.2 F (37.9 C)  Recent Labs  Lab 01/14/22 0406 01/16/22 0916  WBC 15.9* 11.1*  CREATININE 4.82* 4.89*     Estimated Creatinine Clearance: 13.8 mL/min (A) (by C-G formula based on SCr of 4.89 mg/dL (H)).    Allergies  Allergen Reactions   Codeine Nausea And Vomiting   Sulfa Antibiotics Swelling   Clindamycin/Lincomycin Rash   Penicillins Rash    Microbiology results:      Thank you for allowing pharmacy to be a part of this patient's care.  Delbert Vu A 01/19/2022 3:51 PM

## 2022-01-19 NOTE — Assessment & Plan Note (Addendum)
POA as evidenced by tachycardia, leukocytosis, AKI and transaminitis consistent with organ dysfunction and severe sepsis with suspected infection source being sacral wounds versus an abdominal source (CT abdomen pelvis negative for anything acute however).  MRSA PCR negative. Wound cultures grew few Staph aureus, rare Enterococcus faecalis. Initially treated with vancomycin/cefepime. Vanc stopped 6/4.  --Transition to p.o. Keflex to complete course, and then transition to meropenem as she became febrile again. -Repeat wound cultures with MSSA and staph epi which was resistant to methicillin. 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 -Skin biopsy was done on 6/14 by general surgery, preliminary results are negative for calciphylaxis.  Vasculitis can be a possibility, very nonspecific elevation of lupus anticoagulant, patient was on heparin.  Holding heparin for couple of days and then we will repeat blood tomorrow -Sodium thiosulfate was given during dialysis on 6/14 for concern of calciphylaxis -Continue with meropenem -Continue with wound care -Follow final skin biopsy results -Palliative care consulted for pain management

## 2022-01-19 NOTE — Assessment & Plan Note (Signed)
Resolved.  Presented with sodium 129.  This was noted on labs at [facility] and was the reason patient presented to the ED. improved with IV hydration. -Continue to monitor

## 2022-01-19 NOTE — Assessment & Plan Note (Signed)
See above. Pictures in surgery note

## 2022-01-19 NOTE — Plan of Care (Signed)
  Problem: Respiratory: Goal: Ability to maintain adequate ventilation will improve Outcome: Progressing   Problem: Coping: Goal: Ability to adjust to condition or change in health will improve Outcome: Progressing   Problem: Coping: Goal: Level of anxiety will decrease Outcome: Progressing   Problem: Safety: Goal: Ability to remain free from injury will improve Outcome: Progressing

## 2022-01-20 DIAGNOSIS — N186 End stage renal disease: Secondary | ICD-10-CM | POA: Diagnosis not present

## 2022-01-20 LAB — RENAL FUNCTION PANEL
Albumin: 2.3 g/dL — ABNORMAL LOW (ref 3.5–5.0)
Anion gap: 12 (ref 5–15)
BUN: 53 mg/dL — ABNORMAL HIGH (ref 8–23)
CO2: 28 mmol/L (ref 22–32)
Calcium: 9.7 mg/dL (ref 8.9–10.3)
Chloride: 96 mmol/L — ABNORMAL LOW (ref 98–111)
Creatinine, Ser: 3.31 mg/dL — ABNORMAL HIGH (ref 0.44–1.00)
GFR, Estimated: 15 mL/min — ABNORMAL LOW (ref >60)
Glucose, Bld: 195 mg/dL — ABNORMAL HIGH (ref 70–99)
Phosphorus: 3.4 mg/dL (ref 2.5–4.6)
Potassium: 3.9 mmol/L (ref 3.5–5.1)
Sodium: 136 mmol/L (ref 135–145)

## 2022-01-20 LAB — CBC
HCT: 25.1 % — ABNORMAL LOW (ref 36.0–46.0)
Hemoglobin: 7.8 g/dL — ABNORMAL LOW (ref 12.0–15.0)
MCH: 29.1 pg (ref 26.0–34.0)
MCHC: 31.1 g/dL (ref 30.0–36.0)
MCV: 93.7 fL (ref 80.0–100.0)
Platelets: 279 K/uL (ref 150–400)
RBC: 2.68 MIL/uL — ABNORMAL LOW (ref 3.87–5.11)
RDW: 15.2 % (ref 11.5–15.5)
WBC: 11.9 K/uL — ABNORMAL HIGH (ref 4.0–10.5)
nRBC: 0 % (ref 0.0–0.2)

## 2022-01-20 LAB — GLUCOSE, CAPILLARY
Glucose-Capillary: 237 mg/dL — ABNORMAL HIGH (ref 70–99)
Glucose-Capillary: 267 mg/dL — ABNORMAL HIGH (ref 70–99)
Glucose-Capillary: 208 mg/dL — ABNORMAL HIGH (ref 70–99)
Glucose-Capillary: 185 mg/dL — ABNORMAL HIGH (ref 70–99)

## 2022-01-20 NOTE — Assessment & Plan Note (Signed)
Improving Initially with redness, patient attributes it to clindamycin.  She reported on admission that this has been present for over one month.   Left lower extremity Doppler US on 12/18/21 was negative for DVT.  Monitor closely for now.

## 2022-01-20 NOTE — Evaluation (Signed)
Physical Therapy Re-Evaluation Patient Details Name: Theresa Warren MRN: [id number] DOB: 23-Nov-1955 Today's Date: 01/20/2022  History of Present Illness  [name] is a 66 year old female admitted for evaluation of low sodium noted on labs at the facility.  Pt was discharged  to rehab on 5/23 after being admitted following a fall. PMH significant for acute on chronic anemia. hyperlipidemia, hypertension, CKD stage V, insulin-dependent diabetes mellitus.  Clinical Impression  PT re-evaluation performed d/t extended hospitalization.  Pt sitting in recliner upon PT arrival and requesting back to bed (2 NT's present and then pt's nurse arrived).  Initially pt unable to stand up to RW 1st trial standing with 1 assist (from recliner); able to stand up to RW 2nd trial standing with mod assist x2 but unable to stand long enough to take steps recliner to bed so pt sat back down in recliner; pt then stood up to RW 3rd trial standing with mod assist x2 and able to take steps (stand step turn) recliner to bed min to mod assist x2 but then pt sat too early (even with vc's to stay standing) so pt only partially sitting on bed; and with 2 assist pt able to briefly stand and pivot so pt sitting more securely on edge of bed.  Pt assisted back to bed and nursing providing wound care end of session.  PT POC reviewed and updated as appropriate.    Recommendations for follow up therapy are one component of a multi-disciplinary discharge planning process, led by the attending physician.  Recommendations may be updated based on patient status, additional functional criteria and insurance authorization.  Follow Up Recommendations Skilled nursing-short term rehab (<3 hours/day)    Assistance Recommended at Discharge Frequent or constant Supervision/Assistance  Patient can return home with the following  Two people to help with walking and/or transfers;Two people to help with  bathing/dressing/bathroom;Assistance with cooking/housework;Assist for transportation;Help with stairs or ramp for entrance    Equipment Recommendations Rolling walker (2 wheels);BSC/3in1;Wheelchair cushion (measurements PT);Wheelchair (measurements PT);Hospital bed  Recommendations for Other Services       Functional Status Assessment Patient has had a recent decline in their functional status and demonstrates the ability to make significant improvements in function in a reasonable and predictable amount of time.     Precautions / Restrictions Precautions Precautions: Fall Precaution Comments: sacral wounds Restrictions Weight Bearing Restrictions: No      Mobility  Bed Mobility Overal bed mobility: Needs Assistance Bed Mobility: Sit to Supine, Rolling Rolling: Mod assist, Max assist (logrolling towards R side)     Sit to supine: Max assist, +2 for physical assistance   General bed mobility comments: assist for trunk and B LE's    Transfers Overall transfer level: Needs assistance Equipment used: Rolling walker (2 wheels) Transfers: Sit to/from Stand, Bed to chair/wheelchair/BSC Sit to Stand: Mod assist, +2 physical assistance   Step pivot transfers: Min assist, Mod assist, +2 physical assistance       General transfer comment: pt unable to stand up to RW 1st trial standing with 1 assist (from recliner); able to stand up to RW 2nd trial standing with mod assist x2 but unable to stand long enough to take steps recliner to bed so pt sat back down on recliner; pt then stood up to RW 3rd trial standing with mod assist x2 and able to take steps (stand step turn) recliner to bed but sat too early (even with vc's to stay standing) so pt only partially sitting on  bed; with 2 assist pt able to briefly stand and pivot so pt sitting more securely on edge of bed    Ambulation/Gait Ambulation/Gait assistance:  (not appropriate at this time)                Chief Strategy Officer    Modified Rankin (Stroke Patients Only)       Balance Overall balance assessment: Needs assistance Sitting-balance support: No upper extremity supported, Feet supported Sitting balance-Leahy Scale: Good Sitting balance - Comments: steady sitting reaching within BOS   Standing balance support: Bilateral upper extremity supported, During functional activity, Reliant on assistive device for balance Standing balance-Leahy Scale: Poor Standing balance comment: assist for balance with standing activities                             Pertinent Vitals/Pain Pain Assessment Pain Assessment: Faces Faces Pain Scale: Hurts little more Pain Location: sacrum Pain Descriptors / Indicators: Discomfort Pain Intervention(s): Limited activity within patient's tolerance, Monitored during session, Repositioned    Home Living Family/patient expects to be discharged to:: Private residence Living Arrangements: Alone   Type of Home: House Home Access: Stairs to enter Entrance Stairs-Rails: Right Entrance Stairs-Number of Steps: 4-5   Home Layout: Two level;Able to live on main level with bedroom/bathroom Home Equipment: Cane - single point      Prior Function Prior Level of Function : Independent/Modified Independent;Driving             Mobility Comments: Per initial therapy eval "Pt reports she was amb with RW around STR with MOD I; prior to admission prior to fall was amb with cane, +fall history" ADLs Comments: Per initial therapy evaluation "assist for ADLs in rehab, was trialing use of AE; prior to previous admission was indep in ADL/IADL per pt report"     Hand Dominance        Extremity/Trunk Assessment   Upper Extremity Assessment Upper Extremity Assessment: Generalized weakness    Lower Extremity Assessment Lower Extremity Assessment: Generalized weakness    Cervical / Trunk Assessment Cervical / Trunk Assessment: Other  exceptions Cervical / Trunk Exceptions: forward head/shoulders  Communication   Communication: No difficulties  Cognition Arousal/Alertness: Awake/alert Behavior During Therapy: Impulsive Overall Cognitive Status: Within Functional Limits for tasks assessed                                          General Comments  Nursing cleared pt for participation in physical therapy.  Pt agreeable to PT session.    Exercises     Assessment/Plan    PT Assessment Patient needs continued PT services  PT Problem List Decreased strength;Pain;Cardiopulmonary status limiting activity;Decreased activity tolerance;Decreased knowledge of use of DME;Decreased balance;Decreased mobility;Decreased skin integrity       PT Treatment Interventions DME instruction;Therapeutic exercise;Gait training;Balance training;Stair training;Functional mobility training;Therapeutic activities;Patient/family education    PT Goals (Current goals can be found in the Care Plan section)  Acute Rehab PT Goals Patient Stated Goal: to improve strength and mobility PT Goal Formulation: With patient Time For Goal Achievement: 02/03/22 Potential to Achieve Goals: Fair    Frequency Min 2X/week     Co-evaluation               AM-PAC PT "6 Clicks" Mobility  Outcome Measure Help needed turning from your back to your side while in a flat bed without using bedrails?: A Lot Help needed moving from lying on your back to sitting on the side of a flat bed without using bedrails?: A Lot Help needed moving to and from a bed to a chair (including a wheelchair)?: Total Help needed standing up from a chair using your arms (e.g., wheelchair or bedside chair)?: Total Help needed to walk in hospital room?: Total Help needed climbing 3-5 steps with a railing? : Total 6 Click Score: 8    End of Session Equipment Utilized During Treatment: Gait belt Activity Tolerance: Patient limited by fatigue Patient left: in  bed;with nursing/sitter in room (nurse and 2 NT's present assisting pt with wound care in bed) Nurse Communication: Mobility status;Precautions PT Visit Diagnosis: Unsteadiness on feet (R26.81);Muscle weakness (generalized) (M62.81);Difficulty in walking, not elsewhere classified (R26.2)    Time: 1610-9604 PT Time Calculation (min) (ACUTE ONLY): 14 min   Charges:   PT Evaluation $PT Re-evaluation: 1 Re-eval PT Treatments $Therapeutic Activity: 8-22 mins       Leitha Bleak, PT 01/20/22, 2:52 PM

## 2022-01-20 NOTE — Progress Notes (Signed)
[facility]  PROGRESS NOTE   Subjective:   Patient sitting up in bed Alert with confusion at times Partially completed breakfast tray at bedside Pain managed with medications Patient states she has sat in chair, not noted in charting  Objective:  Vital signs: Blood pressure (!) 128/55, pulse 87, temperature 99.2 F (37.3 C), temperature source Oral, resp. rate 18, height 5\' 3"  (1.6 m), weight 110.2 kg, SpO2 100 %.  Intake/Output Summary (Last 24 hours) at 01/20/2022 1352 Last data filed at 01/20/2022 1000 Gross per 24 hour  Intake 546.02 ml  Output --  Net 546.02 ml    Filed Weights   01/19/22 0921 01/19/22 1230 01/20/22 0500  Weight: 112 kg 109 kg 110.2 kg     Physical Exam: General:  No acute distress  Head:  Normocephalic, atraumatic. Moist oral mucosal membranes  Eyes:  Anicteric  Lungs:   Clear to auscultation, normal effort  Heart:  S1S2 no rubs  Abdomen:   Soft, nontender, bowel sounds present  Extremities:  + peripheral edema.  Neurologic:  Awake, alert, following commands  Skin:  No lesions, sacral wound with NPWV  Access: RT Permcath    Basic Metabolic Panel: Recent Labs  Lab 01/14/22 0406 01/16/22 0916 01/20/22 0434  NA 133* 133* 136  K 3.7 3.5 3.9  CL 94* 96* 96*  CO2 26 25 28   GLUCOSE 195* 266* 195*  BUN 58* 74* 53*  CREATININE 4.82* 4.89* 3.31*  CALCIUM 9.3 9.4 9.7  PHOS 4.0 3.9 3.4     CBC: Recent Labs  Lab 01/14/22 0406 01/16/22 0916 01/20/22 0434  WBC 15.9* 11.1* 11.9*  HGB 7.8* 7.0* 7.8*  HCT 24.8* 22.2* 25.1*  MCV 93.9 92.1 93.7  PLT 254 253 279      Urinalysis: No results for input(s): "COLORURINE", "LABSPEC", "PHURINE", "GLUCOSEU", "HGBUR", "BILIRUBINUR", "KETONESUR", "PROTEINUR", "UROBILINOGEN", "NITRITE", "LEUKOCYTESUR" in the last 72 hours.  Invalid input(s): "APPERANCEUR"    Imaging: No results found.   Medications:    sodium chloride Stopped (01/19/22 1845)   sodium thiosulfate 25 g in  sodium chloride 0.9 % 200 mL Infusion for Calciphylaxis Stopped (01/19/22 1305)    (feeding supplement) PROSource Plus  30 mL Oral TID BM   vitamin C  500 mg Oral BID   Chlorhexidine Gluconate Cloth  6 each Topical Q0600   epoetin (EPOGEN/PROCRIT) injection  10,000 Units Intravenous Q M,W,F-HD   fluticasone  2 spray Each Nare Daily   furosemide  40 mg Oral Daily   gabapentin  100 mg Oral TID   insulin aspart  0-5 Units Subcutaneous QHS   insulin aspart  0-9 Units Subcutaneous TID WC   insulin aspart  5 Units Subcutaneous TID WC   insulin glargine-yfgn  8 Units Subcutaneous BID   leptospermum manuka honey  1 application  Topical Daily   lidocaine-EPINEPHrine  20 mL Intradermal Once   loratadine  10 mg Oral Daily   metoprolol succinate  50 mg Oral BID   multivitamin  1 tablet Oral QHS   nutrition supplement (JUVEN)  1 packet Oral BID BM    Assessment/ Plan:     Principal Problem:   Severe sepsis (HCC) Active Problems:   OSA (obstructive sleep apnea)   Chronic kidney disease, stage 5 ([place])   Essential hypertension   DNR (do not resuscitate)/DNI(Do Not Intubate)   Type 2 diabetes mellitus with chronic kidney disease, with long-term current use of insulin (HCC)   Hypomagnesemia   Pressure injury of skin  Symptomatic anemia   Elevated LFTs   Sacral wound, initial encounter   Swelling of left lower extremity   Abdominal pain   AKI (acute kidney injury) ([place])   Hyponatremia   Premature atrial complexes   Anasarca associated with disorder of kidney   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  Patient is reached end-stage renal disease and was started on renal replacement therapy.   2D echo from January 03, 2022-:LVEF 60 to 65%, no regional wall motion abnormalities, asymmetric left ventricular hypertrophy of the basal septal  segment.  Diastolic parameters normal.   #: End-stage renal disease: Patient has a permacath   Scheduled to receive dialysis tomorrow. Per OT note, patient has been placed in chair to build endurance and gauge tolerance for needed outpatient dialysis placement. We will monitor this progress closely     #: Sepsis/fever likely from sacral wound. Biopsy negative for calciphylaxis. Prescribed oral Keflex.  Negative pressure wound VAC remains in place and managed by surgery team.  Sodium thiosulfate with dialysis treatments.   #: Anemia secondary to chronic kidney disease complicated by sepsis. Has received blood transfusions during this admission.  -Hgb 7.8. Continue EPO with dialysis treatments.   #: Secondary hyperparathyroidism: Calcium and phosphorus within acceptable range, will continue to monitor  # Diabetes with CKD Lab Results  Component Value Date   HGBA1C 10.0 (H) 12/18/2021   Poorly controlled Diabetes Glucose elevated, primary team to manage SSI    LOS: [facility] 6/20/20231:52 PM

## 2022-01-20 NOTE — Progress Notes (Signed)
Progress Note   Patient: Brindle Leyba [id number] DOB: 07/15/1956 DOA: 12/31/2021     19 DOS: the patient was seen and examined on 01/20/2022   Brief hospital course: Ms. Nayelie Gionfriddo is a 67 year old female hyperlipidemia, hypertension, CKD stage V, insulin-dependent diabetes mellitus, who presents emergency department from [facility] for evaluation of low sodium noted on labs at the facility.  She was discharged to rehab on 5/23 after being admitted following a fall and also with acute on chronic anemia.  ED Course - temp 99.6, HR 107, otherwise unremarkable vitals. Labs with sodium 129, potassium 5.1, chloride 99, bicarb 17, BUN of 100, serum creatinine of 4.61, GFR of 10, WBC 18.4, hemoglobin 6.9, platelets of 473.  LFT's were also elevated.  Patient met criteria for sepsis on arrival, treated per protocol in the ED with IV Cefepime, Flagyl, Vanc, and LR 1 L bolus fluids.  Infection source unclear but felt possibly due to sacral wound infection and concern for underlying osteomyelitis.  Admitted to the hospitalist service for further evaluation and management.  Continued on IV Cefepime and Vanc.   General surgery was consulted for debridement on sacral pressure injury.  Nephrology consulted due to worsening renal function.   Weeks summary from prior provider. Patient was lethargic and displayed uremic symptoms.  She was started on hemodialysis after tunneled dialysis catheter was placed by vascular surgery on 6/9.  First dialysis was started on 6/9.  In the evening she was febrile and has been spiking fevers for the past few days.  She was placed on IV meropenem broad-spectrum and ID was consulted.  Also she has left thigh induration wounds soft tissue ultrasound was negative and she was also negative for bilateral DVTs.  6/14: 2 separate wound cultures, 1 with MSSA and Enterococcus faecalis and second with MSSA and Staph epidermidis resistant to methicillin.  Antibiotics  were switched to meropenem.  ID is on board.  Patient had multiple wound debridements. 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 Autoimmune and vasculitis labs pending.  CK low at 10, acute hepatitis panel negative. Patient also has postmenopausal vaginal bleeding resulted in blood loss anemia, endometrial thickening on scan which will required outpatient gynecologic evaluation to rule out malignancy.  So far received 2 unit of PRBC.  6/15: Her wound continued to get worse.  Skin biopsy for concern of calciphylaxis was done by general surgery on 01/14/2022.  New pictures in ID note. ANA comprehensive profile was negative.  Rest of the autoimmune labs are pending.  6/16: ANCA profile negative.  Antiphospholipid antibody and skin biopsy is still pending. CBG elevated, increased Semglee to twice daily and added mealtime coverage. Preliminary skin biopsy reports with no calciphylaxis, some microthrombosis, slight sent to [facility] for dermatology department review. Multiple deep wounds and necrosis.  See today's surgery progress note for new pictures.  6/17: Patient antiphospholipid syndrome profile with negative anticardiolipin IgG and IgM but positive PTT lupus anticoagulant and DRVVT, on further characterization DRVVT mix was slightly elevated with normal DRVVT confirm.  Final skin biopsy results still pending.  6/18: CBG remained elevated, ongoing adjustment to her insulin.  Had a curbside consult with different rheumatologist and according to her recommendations she was suggesting stopping heparin for couple of days and repeat PTT lupus anticoagulant.  Patient has very nonspecific testing and does not make the cutoff for antiphospholipid syndrome.  6/19: Per nursing staff patient refusing daily dressing change and keep delaying which interferes with their workflow.  Holding heparin and we can repeat PTT lupus anticoagulant tomorrow before restarting heparin for DVT prophylaxis.   Final skin biopsy results are still pending Continues to have significant pain-palliative care was consulted for pain management. Pulm nephrology if she cannot sit in a dialysis chair, they might not be able to offer dialysis for a long time.  6/20: Palliative care was consulted yesterday for pain management, apparently patient was not using her as needed meds appropriately and they suggested giving a few doses daily.  Lupus anticoagulant was repeated as patient was off the heparin for 2 days.  Received second dose of sodium thiosulfate during dialysis yesterday.  Skin biopsy still pending. ID stopped antibiotics and observing now without them.    Assessment and Plan: * Severe sepsis ([place]) POA as evidenced by tachycardia, leukocytosis, AKI and transaminitis consistent with organ dysfunction and severe sepsis with suspected infection source being sacral wounds versus an abdominal source (CT abdomen pelvis negative for anything acute however).  MRSA PCR negative. Wound cultures grew few Staph aureus, rare Enterococcus faecalis. Initially treated with vancomycin/cefepime. Vanc stopped 6/4.  --Transition to p.o. Keflex to complete course, and then transition to meropenem as she became febrile again. -Repeat wound cultures with MSSA and staph epi which was resistant to methicillin. 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 -Skin biopsy was done on 6/14 by general surgery, preliminary results are negative for calciphylaxis.  Vasculitis can be a possibility, very nonspecific elevation of lupus anticoagulant, patient was on heparin.  Holding heparin for couple of days and then we will repeat blood tomorrow -Sodium thiosulfate was given during dialysis on 6/14and 6/19 for concern of calciphylaxis -Meropenem was discontinued today and we will observe without antibiotics now. -Continue with wound care -Follow final skin biopsy results -Palliative care consulted for pain  management  Swelling of left lower extremity Improving Initially with redness, patient attributes it to clindamycin.  She reported on admission that this has been present for over one month.   Left lower extremity Doppler US on 12/18/21 was negative for DVT.  Monitor closely for now.  Sacral wound, initial encounter See above. Pictures in surgery note  Chronic kidney disease, stage 5 ([place]) Not transition to ESRD and patient was started on dialysis. -Continue with dialysis -Appreciate nephrology help  AKI (acute kidney injury) ([place]) Please see above as patient is now on dialysis.  Premature atrial complexes Patient became significantly tachycardic on 6/2 with heart rates sustaining in the 130s.  Cardiology reviewed telemetry and feels patient dysrhythmias were not A-fib but sinus tach with PACs. --Cardiology following -- Continue Lopressor 25 mg BID   Hypomagnesemia Resolved with replacement on 6/3. Monitor and replace Mg as needed.   Symptomatic anemia Hemoglobin on admission was 6.9 >> 6.3, down from 7.6 at time of discharge on 5/23.  No signs of active bleeding, did have vaginal/endometrial bleeding previous admission but no reports of this recently.  May have slow chronic blood loss from sacral wounds.  Has baseline anemia of chronic disease due to renal failure. Received 2 unit PRBC transfusion . -Continue to monitor -Transfuse if below 7  Abdominal pain Patient reported on admission.  CT of abdomen pelvis showed abnormal endometrial thickening for a postmenopausal patient, bladder distention estimated 520 mL, no other acute or inflammatory processes or acute findings. --Monitor clinically for now -- Needs OB/GYN follow-up for endometrial sampling to exclude endometrial carcinoma  Hyponatremia Resolved.  Presented with sodium 129.  This was noted on labs  at SNF and was the reason patient presented to the ED. improved with IV hydration. -Continue to monitor  Essential  hypertension Blood pressure mildly elevated. 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 -Skin biopsy obtained, preliminary results were negative for calciphylaxis   Anasarca associated with disorder of kidney Nephrology following. Treated with Lasix IV, later Lasix drip, IV albumin.  Nephrology switched back to PO Lasix today.  Monitor I/O's & Daily Wts  Elevated LFTs On admission, alk phos 212, AST 182, ALT 107.  Unclear etiology, repeat LFTs not available today.  Appears LFTs were elevated at time of discharge on 5/23 but have increased since. RUQ ultrasound unremarkable. Acute hepatitis panel negative. -- Follow CMP  Pressure injury of skin Present on admission.  Patient has multiple sacral pressure injuries.  Some concern at time of admission that these were the source of sepsis. General surgery consulted and took patient to the OR on 6/1 for debridement.   Now with wound VAC. --Initial wound VAC change Monday 6/5 then every M/W/F -- Wound care consulted -- Frequent repositioning --Follow general surgery recommendations --Pain control --Follow blood cultures Pressure Injury 01/01/22 Buttocks Left Stage 3 -  Full thickness tissue loss. Subcutaneous fat may be visible but bone, tendon or muscle are NOT exposed. (Active)  01/01/22 0222  Location: Buttocks  Location Orientation: Left  Staging: Stage 3 -  Full thickness tissue loss. Subcutaneous fat may be visible but bone, tendon or muscle are NOT exposed.   Wound Description (Comments):   Present on Admission: Yes     Pressure Injury 01/01/22 Buttocks Right Stage 2 -  Partial thickness loss of dermis presenting as a shallow open injury with a red, pink wound bed without slough. (Active)  01/01/22 0223  Location: Buttocks  Location Orientation: Right  Staging: Stage 2 -  Partial thickness loss of dermis presenting as a shallow open injury with a red, pink wound bed without slough.  Wound Description (Comments):   Present on Admission: Yes     Pressure Injury 01/01/22 Hip Left Stage 2 -  Partial thickness loss of dermis presenting as a shallow open injury with a red, pink wound bed without slough. (Active)  01/01/22 0225  Location: Hip  Location Orientation: Left  Staging: Stage 2 -  Partial thickness loss of dermis presenting as a shallow open injury with a red, pink wound bed without slough.  Wound Description (Comments):   Present on Admission: Yes     Pressure Injury 01/01/22 Hip Right Stage 3 -  Full thickness tissue loss. Subcutaneous fat may be visible but bone, tendon or muscle are NOT exposed. (Active)  01/01/22 0225  Location: Hip  Location Orientation: Right  Staging: Stage 3 -  Full thickness tissue loss. Subcutaneous fat may be visible but bone, tendon or muscle are NOT exposed.  Wound Description (Comments):   Present on Admission: Yes      OSA (obstructive sleep apnea) .  Appears not to be on CPAP     Subjective: Patient was examined and seen today.  Pain little improved today.  A cousin at  bedside.  Dressings were changed this morning.  Physical Exam: Vitals:   01/20/22 0615 01/20/22 0740 01/20/22 1128 01/20/22 1540  BP: (!) 145/60 (!) 140/58 (!) 128/55 139/63  Pulse: 94 87 87 96  Resp: 20 18 18 18   Temp: 99.3 F (37.4 C) 99.1 F (37.3 C) 99.2 F (37.3 C) 99.7 F (37.6 C)  TempSrc:  Oral Oral   SpO2: 94% 100% 100% 100%  Weight:      Height:       General.  Well-developed, obese lady, in no acute  distress. Pulmonary.  Lungs clear bilaterally, normal respiratory effort. CV.  Regular rate and rhythm, no JVD, rub or murmur. Abdomen.  Soft, nontender, nondistended, BS positive. CNS.  Alert and oriented .  No focal neurologic deficit. Extremities.  2+ LE edema, no cyanosis, pulses intact and symmetrical.  Multiple dressings involving bilateral buttock, hip and thigh. Psychiatry.  Judgment and insight appears normal.  Data Reviewed: Prior data reviewed  Family Communication: Discussed with a cousin at bedside.  Disposition: Status is: Inpatient Remains inpatient appropriate because: Severity of illness   Planned Discharge Destination: Skilled nursing facility  Time spent: 45 minutes  This record has been created using Systems analyst. Errors have been sought and corrected,but may not always be located. Such creation errors do not reflect on the standard of care.  Author: Lorella Nimrod, MD 01/20/2022 4:22 PM  For on call review [email or web address].

## 2022-01-20 NOTE — Progress Notes (Signed)
Occupational Therapy Treatment Patient Details Name: Theresa Warren MRN: [id number] DOB: Apr 28, 1956 Today's Date: 01/20/2022   History of present illness [name] is a 66 year old female admitted for evaluation of low sodium noted on labs at the facility.  Pt was discharged  to rehab on 5/23 after being admitted following a fall. PMH significant for acute on chronic anemia. hyperlipidemia, hypertension, CKD stage V, insulin-dependent diabetes mellitus   OT comments  Upon entering the room, pt supine in bed and agreeable to OT intervention. Pt performing bed mobility with min A for trunk support , increased time, and encouragement for task. Pt sitting on EOB for ~ 5 minutes with supervision for static sitting balance. She stands from bed with min A and step pivots to recliner chair with min A. Pt seated on additional cushion in recliner chair secondary to wounds. Pt is found to be itching and several of her dressings and encouraged to not engage in that activity. Pt remains in recliner chair with plans to stay up for ~ 2 hours in order to increase tolerance for dialysis chair/treatment. Call bell and all needed items within reach and chair alarm activated.    Recommendations for follow up therapy are one component of a multi-disciplinary discharge planning process, led by the attending physician.  Recommendations may be updated based on patient status, additional functional criteria and insurance authorization.    Follow Up Recommendations  Skilled nursing-short term rehab (<3 hours/day)    Assistance Recommended at Discharge Frequent or constant Supervision/Assistance  Patient can return home with the following  A lot of help with walking and/or transfers;A lot of help with bathing/dressing/bathroom   Equipment Recommendations  Other (comment) (defer to next venue of care)       Precautions / Restrictions Precautions Precautions: Fall Restrictions Weight Bearing  Restrictions: No       Mobility Bed Mobility Overal bed mobility: Needs Assistance Bed Mobility: Supine to Sit     Supine to sit: Min assist     General bed mobility comments: assist for trunk support and mod multimodal cuing for technique    Transfers Overall transfer level: Needs assistance Equipment used: Rolling walker (2 wheels) Transfers: Sit to/from Stand, Bed to chair/wheelchair/BSC Sit to Stand: Min assist     Step pivot transfers: Min assist           Balance Overall balance assessment: Needs assistance Sitting-balance support: Feet supported, Bilateral upper extremity supported Sitting balance-Leahy Scale: Good     Standing balance support: Bilateral upper extremity supported, During functional activity, Reliant on assistive device for balance Standing balance-Leahy Scale: Fair                             ADL either performed or assessed with clinical judgement    Extremity/Trunk Assessment Upper Extremity Assessment Upper Extremity Assessment: Generalized weakness   Lower Extremity Assessment Lower Extremity Assessment: Generalized weakness        Vision Patient Visual Report: No change from baseline            Cognition Arousal/Alertness: Awake/alert Behavior During Therapy: WFL for tasks assessed/performed Overall Cognitive Status: Within Functional Limits for tasks assessed  Pertinent Vitals/ Pain       Pain Assessment Pain Assessment: Faces Faces Pain Scale: Hurts little more Pain Location: sacrum Pain Descriptors / Indicators: Discomfort Pain Intervention(s): Monitored during session, Repositioned         Frequency  Min 2X/week        Progress Toward Goals  OT Goals(current goals can now be found in the care plan section)  Progress towards OT goals: Progressing toward goals  Acute Rehab OT Goals Patient Stated Goal: to get  better OT Goal Formulation: With patient Time For Goal Achievement: 01/29/22 Potential to Achieve Goals: [place] Discharge plan remains appropriate;Frequency remains appropriate       AM-PAC OT "6 Clicks" Daily Activity     Outcome Measure   Help from another person eating meals?: None Help from another person taking care of personal grooming?: A Little Help from another person toileting, which includes using toliet, bedpan, or urinal?: A Lot Help from another person bathing (including washing, rinsing, drying)?: A Lot Help from another person to put on and taking off regular upper body clothing?: A Little Help from another person to put on and taking off regular lower body clothing?: A Lot 6 Click Score: 16    End of Session Equipment Utilized During Treatment: Rolling walker (2 wheels)  OT Visit Diagnosis: Other abnormalities of gait and mobility (R26.89);Muscle weakness (generalized) (M62.81)   Activity Tolerance Patient limited by fatigue   Patient Left with call bell/phone within reach;with chair alarm set;in chair   Nurse Communication Mobility status;Other (comment) (Plans for pt to sit up for ~ 2 hours before returning to bed)        Time: 4098-1191 OT Time Calculation (min): 23 min  Charges: OT General Charges $OT Visit: 1 Visit OT Treatments $Therapeutic Activity: 23-37 mins  Darleen Crocker, MS, OTR/L , CBIS ascom [phone]  01/20/22, 1:03 PM

## 2022-01-20 NOTE — Assessment & Plan Note (Signed)
POA as evidenced by tachycardia, leukocytosis, AKI and transaminitis consistent with organ dysfunction and severe sepsis with suspected infection source being sacral wounds versus an abdominal source (CT abdomen pelvis negative for anything acute however).  MRSA PCR negative. Wound cultures grew few Staph aureus, rare Enterococcus faecalis. Initially treated with vancomycin/cefepime. Vanc stopped 6/4.  --Transition to p.o. Keflex to complete course, and then transition to meropenem as she became febrile again. -Repeat wound cultures with MSSA and staph epi which was resistant to methicillin. 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 -Skin biopsy was done on 6/14 by general surgery, preliminary results are negative for calciphylaxis.  Vasculitis can be a possibility, very nonspecific elevation of lupus anticoagulant, patient was on heparin.  Holding heparin for couple of days and then we will repeat blood tomorrow -Sodium thiosulfate was given during dialysis on 6/14and 6/19 for concern of calciphylaxis -Meropenem was discontinued today and we will observe without antibiotics now. -Continue with wound care -Follow final skin biopsy results -Palliative care consulted for pain management

## 2022-01-20 NOTE — Assessment & Plan Note (Signed)
See above. Pictures in surgery note

## 2022-01-20 NOTE — Progress Notes (Signed)
Nutrition Follow-up  DOCUMENTATION CODES:   Morbid obesity  INTERVENTION:   -Continue 30 ml Prosource Plus TID, each supplement provides 100 kcals and 15 grams protein -Continue 1 packet Juven BID, each packet provides 95 calories, 2.5 grams of protein (collagen), and 9.8 grams of carbohydrate (3 grams sugar); also contains 7 grams of L-arginine and L-glutamine, 300 mg vitamin C, 15 mg vitamin E, 1.2 mcg vitamin B-12, 9.5 mg zinc, 200 mg calcium, and 1.5 g  Calcium Beta-hydroxy-Beta-methylbutyrate to support wound healing  -Continue renal MVI daily -Continue 500 mg vitamin C BID -Continue 220 mg zinc sulfate daily x 14 days  NUTRITION DIAGNOSIS:   Increased nutrient needs related to wound healing as evidenced by estimated needs.  Ongoing  GOAL:   Patient will meet greater than or equal to 90% of their needs  Progressing   MONITOR:   PO intake, Supplement acceptance, Diet advancement  REASON FOR ASSESSMENT:   Malnutrition Screening Tool    ASSESSMENT:   Pt with PMH of hyperlipidemia, hypertension, insulin-dependent diabetes mellitus, who presents from [facility] for chief concerns of abnormal serum sodium levels on labs.  5/31- s/p Debridement of three decubitus wounds of bilateral buttocks, total debridement area of 133 cm2;  Placement of negative pressure dressing > 50 cm. 6/7- vac changed 6/9- vac changed 6/9- HD cath placed, HD initiated 6/13- soft tissue ultrasound reveals no evidence of abscess 6/14- s/p punch biopsy on lt inner and outerthighs due to possible calciphylaxis   Reviewed I/O's: +88 ml x 24 houra and -4.8 L since 01/06/22  Pt sleeping soundly at time of visit. RD did not disturb.   Pt with continue good oral intake. Meal completion 75-100%. Pt continues to drink supplements well.   Biopsy results pending regarding concern for calciphylaxis.   Palliative care consulted for goals of care and pain control.   Labs reviewed: CBGS: [id number]  (inpatient orders for glycemic control are 0-5 units insulin aspart daily at bedtime, 0-9 units insulin aspart TID with meals, 5 units insulin aspart TID with meals, and 8 units insulin glargine-yfgn BID).    Diet Order:   Diet Order             Diet renal/carb modified with fluid restriction Diet-HS Snack? Nothing; Fluid restriction: 1200 mL Fluid; Room service appropriate? Yes; Fluid consistency: Thin  Diet effective now                   EDUCATION NEEDS:   Not appropriate for education at this time  Skin:  Skin Assessment: Skin Integrity Issues: Skin Integrity Issues:: Stage IV DTI: lt thigh Stage II: rt buttocks, bilateral buttocks, lt hips Stage III: lt buttocks, rt hip Stage IV: lt hip, lt buttocks, sacrum Wound Vac: buttocks  Last BM:  01/16/22  Height:   Ht Readings from Last 1 Encounters:  12/31/21 5\' 3"  (1.6 m)    Weight:   Wt Readings from Last 1 Encounters:  01/20/22 110.2 kg    Ideal Body Weight:  52.3 kg  BMI:  Body mass index is 43.04 kg/m.  Estimated Nutritional Needs:   Kcal:  1650-1850  Protein:  90-105 grams  Fluid:  1000 ml + UOP    Loistine Chance, RD, LDN, [place] Registered Dietitian II Certified Diabetes Care and Education Specialist Please refer to [facility] for RD and/or RD on-call/weekend/after hours pager

## 2022-01-21 DIAGNOSIS — Z992 Dependence on renal dialysis: Secondary | ICD-10-CM | POA: Diagnosis not present

## 2022-01-21 DIAGNOSIS — A419 Sepsis, unspecified organism: Secondary | ICD-10-CM | POA: Diagnosis not present

## 2022-01-21 DIAGNOSIS — R6520 Severe sepsis without septic shock: Secondary | ICD-10-CM | POA: Diagnosis not present

## 2022-01-21 DIAGNOSIS — N186 End stage renal disease: Secondary | ICD-10-CM | POA: Diagnosis not present

## 2022-01-21 LAB — GLUCOSE, CAPILLARY
Glucose-Capillary: 252 mg/dL — ABNORMAL HIGH (ref 70–99)
Glucose-Capillary: 203 mg/dL — ABNORMAL HIGH (ref 70–99)
Glucose-Capillary: 138 mg/dL — ABNORMAL HIGH (ref 70–99)

## 2022-01-21 LAB — LUPUS ANTICOAGULANT PANEL
DRVVT: 35.9 s (ref 0.0–47.0)
PTT Lupus Anticoagulant: 45.1 s — ABNORMAL HIGH (ref 0.0–43.5)

## 2022-01-21 LAB — PTT-LA MIX: PTT-LA Mix: 43.1 s — ABNORMAL HIGH (ref 0.0–40.5)

## 2022-01-21 LAB — HEXAGONAL PHASE PHOSPHOLIPID: Hexagonal Phase Phospholipid: 2 s (ref 0–11)

## 2022-01-21 MED ORDER — EPOETIN ALFA 10000 UNIT/ML IJ SOLN
INTRAMUSCULAR | Status: AC
  Filled 2022-01-21: qty 1

## 2022-01-21 MED ORDER — OXYCODONE HCL 5 MG PO TABS
ORAL_TABLET | ORAL | Status: AC
  Filled 2022-01-21: qty 1

## 2022-01-21 MED ORDER — HEPARIN SODIUM (PORCINE) 1000 UNIT/ML IJ SOLN
INTRAMUSCULAR | Status: AC
  Administered 2022-01-21: 4200 [IU] via INTRAVENOUS_CENTRAL
  Filled 2022-01-21: qty 10

## 2022-01-21 NOTE — TOC Progression Note (Signed)
Transition of Care [facility]) - Progression Note    Patient Details  Name: Theresa Warren MRN: [id number] Date of Birth: Dec 23, 1955  Transition of Care [facility]) CM/SW [place], RN Phone Number: 01/21/2022, 11:30 AM  Clinical Narrative:    Met with the patient's cousin Theresa Warren  from [place] in the room, They are working with a Chief Executive Officer to do the [employer] work and she is going to appoint her local friend Theresa Warren as her POA, Theresa Warren will plan to come to the hospital to do the PAO paperwork for healthcare with the chaplain,   I explained that there are bed offers but that it is too early to review them simply because the patient is not medically ready to DC, She was at [facility] for about a week  from 5/23-5/31 and does not want to return to [facility] will need to be resent to ensure the bed offers that are in place are still available.  I explained that The biopsy results are still pending and have been sent to [facility] would like to be kept in the loop of what is going on secondary to Theresa Warren once the POA papers are in place, he provided his phone number to be [phone], the number is listed in the system  The patient does not have any immediate family only cousins that live out of state,  She used to work in Corporate treasurer and is very aware of what is going on and what things mean. The patient feels like she is on a merry go round, she can hear what is going on around her but due to pain medication she is often in and out of coherency  and feels like she is dreaming when she hears conversations int he room or people speaking to her, It may be necessary to repeat things to her at a later time if she is not coherent.    She is more alert in the mornings prior to having pain medication and wound care I explained that we will always try to include the patient in what is going on and give her a voice. Theresa Warren stated understanding He said that another cousin is  going to come to see her next week and that others will try to visit as well.   Expected Discharge Plan: [place] Barriers to Discharge: Continued Medical Work up  Expected Discharge Plan and Services Expected Discharge Plan: [place]   Discharge Planning Services: CM Consult Post Acute Care Choice:  (TBD) Living arrangements for the past 2 months: Single Family Home                                       Social Determinants of Health (SDOH) Interventions    Readmission Risk Interventions     No data to display

## 2022-01-21 NOTE — Progress Notes (Signed)
PROGRESS NOTE    Theresa Warren  WUJ:[id number] DOB: 06-28-1956 DOA: 12/31/2021 PCP: Crist Infante, MD    Brief Narrative:  Brief hospital course: Theresa Warren is a 66 year old female hyperlipidemia, hypertension, CKD stage V, insulin-dependent diabetes mellitus, who presents emergency department from [facility] for evaluation of low sodium noted on labs at the facility.  She was discharged to rehab on 5/23 after being admitted following a fall and also with acute on chronic anemia.   ED Course - temp 99.6, HR 107, otherwise unremarkable vitals. Labs with sodium 129, potassium 5.1, chloride 99, bicarb 17, BUN of 100, serum creatinine of 4.61, GFR of 10, WBC 18.4, hemoglobin 6.9, platelets of 473.  LFT's were also elevated.   Patient met criteria for sepsis on arrival, treated per protocol in the ED with IV Cefepime, Flagyl, Vanc, and LR 1 L bolus fluids.  Infection source unclear but felt possibly due to sacral wound infection and concern for underlying osteomyelitis.   Admitted to the hospitalist service for further evaluation and management.  Continued on IV Cefepime and Vanc.   General surgery was consulted for debridement on sacral pressure injury.  Nephrology consulted due to worsening renal function.    Weeks summary from prior provider. Patient was lethargic and displayed uremic symptoms.  She was started on hemodialysis after tunneled dialysis catheter was placed by vascular surgery on 6/9.  First dialysis was started on 6/9.  In the evening she was febrile and has been spiking fevers for the past few days.  She was placed on IV meropenem broad-spectrum and ID was consulted.  Also she has left thigh induration wounds soft tissue ultrasound was negative and she was also negative for bilateral DVTs.   6/14: 2 separate wound cultures, 1 with MSSA and Enterococcus faecalis and second with MSSA and Staph epidermidis resistant to methicillin.  Antibiotics were switched to  meropenem.  ID is on board.  Patient had multiple wound debridements. 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 Autoimmune and vasculitis labs pending.  CK low at 10, acute hepatitis panel negative. Patient also has postmenopausal vaginal bleeding resulted in blood loss anemia, endometrial thickening on scan which will required outpatient gynecologic evaluation to rule out malignancy.  So far received 2 unit of PRBC.   6/15: Her wound continued to get worse.  Skin biopsy for concern of calciphylaxis was done by general surgery on 01/14/2022.  New pictures in ID note. ANA comprehensive profile was negative.  Rest of the autoimmune labs are pending.   6/16: ANCA profile negative.  Antiphospholipid antibody and skin biopsy is still pending. CBG elevated, increased Semglee to twice daily and added mealtime coverage. Preliminary skin biopsy reports with no calciphylaxis, some microthrombosis, slight sent to [facility] for dermatology department review. Multiple deep wounds and necrosis.  See today's surgery progress note for new pictures.   6/17: Patient antiphospholipid syndrome profile with negative anticardiolipin IgG and IgM but positive PTT lupus anticoagulant and DRVVT, on further characterization DRVVT mix was slightly elevated with normal DRVVT confirm.  Final skin biopsy results still pending.   6/18: CBG remained elevated, ongoing adjustment to her insulin.  Had a curbside consult with different rheumatologist and according to her recommendations she was suggesting stopping heparin for couple of days and repeat PTT lupus anticoagulant.  Patient has very nonspecific testing and does not make the cutoff for antiphospholipid syndrome.   6/19: Per nursing staff patient refusing daily dressing change and keep delaying which  interferes with their workflow.  Holding heparin and we can repeat PTT lupus anticoagulant tomorrow before restarting heparin for DVT prophylaxis.  Final skin  biopsy results are still pending Continues to have significant pain-palliative care was consulted for pain management. Pulm nephrology if she cannot sit in a dialysis chair, they might not be able to offer dialysis for a long time.   6/20: Palliative care was consulted yesterday for pain management, apparently patient was not using her as needed meds appropriately and they suggested giving a few doses daily.  Lupus anticoagulant was repeated as patient was off the heparin for 2 days.  Received second dose of sodium thiosulfate during dialysis yesterday.  Skin biopsy still pending. ID stopped antibiotics and observing now without them.   6/21 Had HD earlier. Feels tired after HD now.    Consultants:  Nephrology, palliative, ID  Procedures:   Antimicrobials:     Subjective: No abd pain  Objective: Vitals:   01/20/22 1540 01/20/22 2046 01/21/22 0500 01/21/22 0542  BP: 139/63 (!) 125/53  (!) 148/64  Pulse: 96 87  89  Resp: 18 18  14   Temp: 99.7 F (37.6 C) 99.2 F (37.3 C)  99.3 F (37.4 C)  TempSrc:      SpO2: 100% 94%  100%  Weight:   107 kg   Height:        Intake/Output Summary (Last 24 hours) at 01/21/2022 0801 Last data filed at 01/20/2022 1000 Gross per 24 hour  Intake 60 ml  Output --  Net 60 ml   Filed Weights   01/19/22 1230 01/20/22 0500 01/21/22 0500  Weight: 109 kg 110.2 kg 107 kg    Examination: Calm, NAD Cta no w/r Reg s1/s2 no gallop Soft benign +bs Mild LE edema. Multiple areas of wound with dressing on them. I check couple of them Responds, but very tired. Mood and affect appropriate in current setting     Data Reviewed: I have personally reviewed following labs and imaging studies  CBC: Recent Labs  Lab 01/16/22 0916 01/20/22 0434  WBC 11.1* 11.9*  HGB 7.0* 7.8*  HCT 22.2* 25.1*  MCV 92.1 93.7  PLT 253 962   Basic Metabolic Panel: Recent Labs  Lab 01/16/22 0916 01/20/22 0434  NA 133* 136  K 3.5 3.9  CL 96* 96*  CO2 25 28   GLUCOSE 266* 195*  BUN 74* 53*  CREATININE 4.89* 3.31*  CALCIUM 9.4 9.7  PHOS 3.9 3.4   GFR: Estimated Creatinine Clearance: 20.1 mL/min (A) (by C-G formula based on SCr of 3.31 mg/dL (H)). Liver Function Tests: Recent Labs  Lab 01/16/22 0916 01/20/22 0434  ALBUMIN 2.3* 2.3*   No results for input(s): "LIPASE", "AMYLASE" in the last 168 hours. No results for input(s): "AMMONIA" in the last 168 hours. Coagulation Profile: No results for input(s): "INR", "PROTIME" in the last 168 hours. Cardiac Enzymes: No results for input(s): "CKTOTAL", "CKMB", "CKMBINDEX", "TROPONINI" in the last 168 hours. BNP (last 3 results) No results for input(s): "PROBNP" in the last 8760 hours. HbA1C: No results for input(s): "HGBA1C" in the last 72 hours. CBG: Recent Labs  Lab 01/19/22 2101 01/20/22 0842 01/20/22 1128 01/20/22 1542 01/20/22 2048  GLUCAP 189* 237* 267* 208* 185*   Lipid Profile: No results for input(s): "CHOL", "HDL", "LDLCALC", "TRIG", "CHOLHDL", "LDLDIRECT" in the last 72 hours. Thyroid Function Tests: No results for input(s): "TSH", "T4TOTAL", "FREET4", "T3FREE", "THYROIDAB" in the last 72 hours. Anemia Panel: No results for input(s): "VITAMINB12", "FOLATE", "  FERRITIN", "TIBC", "IRON", "RETICCTPCT" in the last 72 hours. Sepsis Labs: No results for input(s): "PROCALCITON", "LATICACIDVEN" in the last 168 hours.  Recent Results (from the past 240 hour(s))  Culture, blood (Routine X 2) w Reflex to ID Panel     Status: None   Collection Time: 01/11/22  5:52 PM   Specimen: BLOOD  Result Value Ref Range Status   Specimen Description BLOOD [facility]  Final   Special Requests BOTTLES DRAWN AEROBIC AND ANAEROBIC [place]  Final   Culture   Final    NO GROWTH 5 DAYS Performed at [facility], [address], [place], [place] [address]    Report Status 01/16/2022 FINAL  Final  Culture, blood (Routine X 2) w Reflex to ID Panel     Status: None   Collection Time: 01/11/22  6:00  PM   Specimen: BLOOD  Result Value Ref Range Status   Specimen Description BLOOD LAC  Final   Special Requests BOTTLES DRAWN AEROBIC AND ANAEROBIC BCAV  Final   Culture   Final    NO GROWTH 5 DAYS Performed at [facility], [address] [place], [place] [address]    Report Status 01/16/2022 FINAL  Final         Radiology Studies: No results found.      Scheduled Meds:  (feeding supplement) PROSource Plus  30 mL Oral TID BM   vitamin C  500 mg Oral BID   Chlorhexidine Gluconate Cloth  6 each Topical Q0600   epoetin (EPOGEN/PROCRIT) injection  10,000 Units Intravenous Q M,W,F-HD   fluticasone  2 spray Each Nare Daily   furosemide  40 mg Oral Daily   gabapentin  100 mg Oral TID   insulin aspart  0-5 Units Subcutaneous QHS   insulin aspart  0-9 Units Subcutaneous TID WC   insulin aspart  5 Units Subcutaneous TID WC   insulin glargine-yfgn  8 Units Subcutaneous BID   leptospermum manuka honey  1 application  Topical Daily   lidocaine-EPINEPHrine  20 mL Intradermal Once   loratadine  10 mg Oral Daily   metoprolol succinate  50 mg Oral BID   multivitamin  1 tablet Oral QHS   nutrition supplement (JUVEN)  1 packet Oral BID BM   Continuous Infusions:  sodium chloride Stopped (01/19/22 1845)   sodium thiosulfate 25 g in sodium chloride 0.9 % 200 mL Infusion for Calciphylaxis Stopped (01/19/22 1305)    Assessment & Plan:   Principal Problem:   Severe sepsis ([place]) Active Problems:   Sacral wound, initial encounter   Swelling of left lower extremity   Chronic kidney disease, stage 5 (HCC)   AKI (acute kidney injury) ([place])   Hypomagnesemia   Premature atrial complexes   Symptomatic anemia   Abdominal pain   Hyponatremia   Essential hypertension   Type 2 diabetes mellitus with chronic kidney disease, with long-term current use of insulin ([place])   DNR (do not resuscitate)/DNI(Do Not Intubate)   OSA (obstructive sleep apnea)   Pressure injury of skin    Elevated LFTs   Anasarca associated with disorder of kidney   Calciphylaxis   Severe sepsis (HCC) POA as evidenced by tachycardia, leukocytosis, AKI and transaminitis consistent with organ dysfunction and severe sepsis with suspected infection source being sacral wounds versus an abdominal source (CT abdomen pelvis negative for anything acute however).  MRSA PCR negative. Wound cultures grew few Staph aureus, rare Enterococcus faecalis. Initially treated with vancomycin/cefepime. Vanc stopped 6/4.  --Transition  to p.o. Keflex to complete course, and then transition to meropenem as she became febrile again. -Repeat wound cultures with MSSA and staph epi which was resistant to methicillin. 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 -Skin biopsy was done on 6/14 by general surgery, preliminary results are negative for calciphylaxis.  Vasculitis can be a possibility, very nonspecific elevation of lupus anticoagulant, patient was on heparin.  Holding heparin for couple of days and then we will repeat blood tomorrow -Sodium thiosulfate was given during dialysis on 6/14and 6/19 for concern of calciphylaxis -Meropenem was discontinued today and we will observe without antibiotics now. 6/21 continue wound care Follow-up skin biopsy results from [facility] Palliative following please see note    Swelling of left lower extremity Improving Initially with redness, patient attributes it to clindamycin.  She reported on admission that this has been present for over one month.   Left lower extremity Doppler US on 12/18/21 was negative for DVT. 6/21 continue to monitor   Sacral wound, initial encounter See above. Surgery was following 6/21 wound vac placed Wound nsg following   Chronic kidney disease, stage 5 ([place]) Not transition to ESRD and patient was started on dialysis. 6/21 Had HD today   AKI (acute kidney injury) ([place]) Please see above as patient is now on dialysis.    Premature atrial complexes Patient became significantly tachycardic on 6/2 with heart rates sustaining in the 130s.  Cardiology reviewed telemetry and feels patient dysrhythmias were not A-fib but sinus tach with PACs. --Cardiology following -- Continue Lopressor 25 mg BID     Hypomagnesemia Resolved with replacement on 6/3. Monitor and replace Mg as needed.     Symptomatic anemia Hemoglobin on admission was 6.9 >> 6.3, down from 7.6 at time of discharge on 5/23.  No signs of active bleeding, did have vaginal/endometrial bleeding previous admission but no reports of this recently.  May have slow chronic blood loss from sacral wounds.  Has baseline anemia of chronic disease due to renal failure. Received 2 unit PRBC transfusion . -Continue to monitor -Transfuse if below 7   Abdominal pain Patient reported on admission.  CT of abdomen pelvis showed abnormal endometrial thickening for a postmenopausal patient, bladder distention estimated 520 mL, no other acute or inflammatory processes or acute findings. --Monitor clinically for now -- Needs OB/GYN follow-up for endometrial sampling to exclude endometrial carcinoma   Hyponatremia Resolved.  Presented with sodium 129.  This was noted on labs at [facility] and was the reason patient presented to the ED. improved with IV hydration. -Continue to monitor   Essential hypertension Blood pressure mildly elevated. -Continue with Lasix, metoprolol and hydralazine   Type 2 diabetes mellitus with chronic kidney disease, with long-term current use of insulin (HCC) CBG remained elevated, at 321 this morning -Increase Semglee to 8 units twice daily -Increase mealtime coverage to 5 units -Continue with SSI   DNR (do not resuscitate)/DNI(Do Not Intubate) Admitting hospitalist confirmed DNR CODE STATUS with patient during admission encounter   Calciphylaxis Concern of calciphylaxis due to nonhealing and worsening wound with necrosis. -Skin biopsy  obtained, preliminary results were negative for calciphylaxis     Anasarca associated with disorder of kidney Nephrology following. Treated with Lasix IV, later Lasix drip, IV albumin.  Nephrology switched back to PO Lasix today.  Monitor I/O's & Daily Wts   Elevated LFTs On admission, alk phos 212, AST 182, ALT 107.  Unclear etiology, repeat LFTs not available today.  Appears LFTs were  elevated at time of discharge on 5/23 but have increased since. RUQ ultrasound unremarkable. Acute hepatitis panel negative. -- Follow CMP   Pressure injury of skin Present on admission.  Patient has multiple sacral pressure injuries.  Some concern at time of admission that these were the source of sepsis. General surgery consulted and took patient to the OR on 6/1 for debridement.   Now with wound VAC. --Initial wound VAC change Monday 6/5 then every M/W/F -- Wound care consulted -- Frequent repositioning --Follow general surgery recommendations --Pain control --Follow blood cultures Pressure Injury 01/01/22 Buttocks Left Stage 3 -  Full thickness tissue loss. Subcutaneous fat may be visible but bone, tendon or muscle are NOT exposed. (Active)  01/01/22 0222  Location: Buttocks  Location Orientation: Left  Staging: Stage 3 -  Full thickness tissue loss. Subcutaneous fat may be visible but bone, tendon or muscle are NOT exposed.  Wound Description (Comments):   Present on Admission: Yes     Pressure Injury 01/01/22 Buttocks Right Stage 2 -  Partial thickness loss of dermis presenting as a shallow open injury with a red, pink wound bed without slough. (Active)  01/01/22 0223  Location: Buttocks  Location Orientation: Right  Staging: Stage 2 -  Partial thickness loss of dermis presenting as a shallow open injury with a red, pink wound bed without slough.  Wound Description (Comments):   Present on Admission: Yes     Pressure Injury 01/01/22 Hip Left Stage 2 -  Partial thickness loss of dermis  presenting as a shallow open injury with a red, pink wound bed without slough. (Active)  01/01/22 0225  Location: Hip  Location Orientation: Left  Staging: Stage 2 -  Partial thickness loss of dermis presenting as a shallow open injury with a red, pink wound bed without slough.  Wound Description (Comments):   Present on Admission: Yes     Pressure Injury 01/01/22 Hip Right Stage 3 -  Full thickness tissue loss. Subcutaneous fat may be visible but bone, tendon or muscle are NOT exposed. (Active)  01/01/22 0225  Location: Hip  Location Orientation: Right  Staging: Stage 3 -  Full thickness tissue loss. Subcutaneous fat may be visible but bone, tendon or muscle are NOT exposed.  Wound Description (Comments):   Present on Admission: Yes          OSA (obstructive sleep apnea) .  Appears not to be on CPAP     DVT prophylaxis: scd Code Status:dnr Family Communication: none at bedside Disposition Plan: SNF Status is: Inpatient Remains inpatient appropriate because: w/u pending      LOS: 20 days   Time spent: 53mn    SNolberto Hanlon MD [facility] Pager [phone]  If 7PM-7AM, please contact night-coverage 01/21/2022, 8:01 AM

## 2022-01-21 NOTE — Plan of Care (Signed)
Spoke with nephrology who would like [place] conversations initiated. Current consult is for pain management. Patient has been in dialysis today and not in room. Will reattempt visit at another time.

## 2022-01-21 NOTE — Progress Notes (Signed)
PT Cancellation Note  Patient Details Name: Theresa Warren MRN: [id number] DOB: [birth year]   Cancelled Treatment:    Reason Eval/Treat Not Completed: Patient at procedure or test/unavailable. Patient at HD, will re-attempt at later time/date.    Zenon Leaf 01/21/2022, 11:58 AM

## 2022-01-21 NOTE — Progress Notes (Signed)
Hemodialysis Post Treatment Note:  Tx date:01/21/2022 Tx time: 3 hours Access: right CVC UF Removed: 500 ml  Note: HD treatment completed. Tolerated well. No HD complications noted. Post HD BP 147/50.

## 2022-01-21 NOTE — Progress Notes (Signed)
ID Pt says pain is betterr but on turning had severe pain In dialysis tearful Skin biopsy was done last week  O/e in distress Awake Patient Vitals for the past 24 hrs:  BP Temp Temp src Pulse Resp SpO2 Weight  01/21/22 1230 (!) 137/46 -- -- 99 19 -- --  01/21/22 1215 (!) 138/47 -- -- 97 (!) 25 -- --  01/21/22 1200 (!) 126/46 -- -- 92 20 -- --  01/21/22 1145 (!) 133/47 -- -- 97 (!) 21 -- --  01/21/22 1130 (!) 122/55 -- -- 96 (!) 28 -- --  01/21/22 1115 (!) 131/105 -- -- 93 19 -- --  01/21/22 1100 (!) 108/95 -- -- 94 14 -- --  01/21/22 1045 (!) 129/54 -- -- 96 19 -- --  01/21/22 1033 -- 99.7 F (37.6 C) Oral 100 17 100 % --  01/21/22 1028 -- -- -- -- -- -- 111 kg  01/21/22 0802 (!) 146/52 98.7 F (37.1 C) Oral 93 18 96 % --  01/21/22 0542 (!) 148/64 99.3 F (37.4 C) -- 89 14 100 % --  01/21/22 0500 -- -- -- -- -- -- 107 kg  01/20/22 2046 (!) 125/53 99.2 F (37.3 C) -- 87 18 94 % --  01/20/22 1540 139/63 99.7 F (37.6 C) -- 96 18 100 % --   Chest b/l air entry Hs- Tachycardia CNS non focal Skin- multiple areas of indurated skin/soft tissue over the lower extremities        B/l buttock wounds covered with wound vac- did not examine   Labs    Latest Ref Rng & Units 01/20/2022    4:34 AM 01/16/2022    9:16 AM 01/14/2022    4:06 AM  CBC  WBC 4.0 - 10.5 K/uL 11.9  11.1  15.9   Hemoglobin 12.0 - 15.0 g/dL 7.8  7.0  7.8   Hematocrit 36.0 - 46.0 % 25.1  22.2  24.8   Platelets 150 - 400 K/uL 279  253  254        Latest Ref Rng & Units 01/20/2022    4:34 AM 01/16/2022    9:16 AM 01/14/2022    4:06 AM  CMP  Glucose 70 - 99 mg/dL 195  266  195   BUN 8 - 23 mg/dL 53  74  58   Creatinine 0.44 - 1.00 mg/dL 3.31  4.89  4.82   Sodium 135 - 145 mmol/L 136  133  133   Potassium 3.5 - 5.1 mmol/L 3.9  3.5  3.7   Chloride 98 - 111 mmol/L 96  96  94   CO2 22 - 32 mmol/L 28  25  26    Calcium 8.9 - 10.3 mg/dL 9.7  9.4  9.3    6/11 BC NG 6/10 BC NG  ANA profile  neg ANCA  neg Antiphospholipid pending  Gluteal wound culture- staph aureus and enterococcus  Impression/recommendation B/l buttock wounds- calciphylaxis VS pressure-  Completed 7 days of antibiotic Off antibiotics X 48 h  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 Paraneoplastic Punch biopsy from the left hip and inner thigh did not show calciphylaxis when read at [facility]- but is has been sent to [place] whether it is a sampling error ANA/ANCA neg Pt will need care at a center whether there is dermatology, so that a better biopsy can be done and there lesions can be managed  accordingly   Fever- has resolved  Transaminitis ? Cause- will repeat tomorrow  Skin rash- waxes and wanes- doubt drug related  ? Autonomic   Vaginal bleed and endometrial stripe thickening- recommend GYN for endometrial biopsy to r/o malignancy  Dicussed the management with hospitalist and nephrologist

## 2022-01-21 NOTE — Progress Notes (Addendum)
[facility]  PROGRESS NOTE   Subjective:   Patient seen and evaluated during dialysis   HEMODIALYSIS FLOWSHEET:  Blood Flow Rate (mL/min): 400 mL/min Arterial Pressure (mmHg): -150 mmHg Venous Pressure (mmHg): 120 mmHg Transmembrane Pressure (mmHg): 60 mmHg Ultrafiltration Rate (mL/min): 330 mL/min Dialysate Flow Rate (mL/min): 600 ml/min Conductivity: 14 Conductivity: 14 Dialysis Fluid Bolus: Normal Saline Bolus Amount (mL): 250 mL  Currently complaining of lower back and sacral pain.  Tolerated breakfast well  Objective:  Vital signs: Blood pressure (!) 129/54, pulse 96, temperature 99.7 F (37.6 C), temperature source Oral, resp. rate 19, height 5\' 3"  (1.6 m), weight 111 kg, SpO2 100 %. No intake or output data in the 24 hours ending 01/21/22 1110  Filed Weights   01/20/22 0500 01/21/22 0500 01/21/22 1028  Weight: 110.2 kg 107 kg 111 kg     Physical Exam: General:  No acute distress  Head:  Normocephalic, atraumatic. Moist oral mucosal membranes  Eyes:  Anicteric  Lungs:   Clear to auscultation, normal effort  Heart:  S1S2 no rubs  Abdomen:   Soft, nontender, bowel sounds present  Extremities:  + peripheral edema.  Neurologic:  Awake, alert, following commands  Skin:  No lesions, sacral wound with NPWV  Access: RT Permcath    Basic Metabolic Panel: Recent Labs  Lab 01/16/22 0916 01/20/22 0434  NA 133* 136  K 3.5 3.9  CL 96* 96*  CO2 25 28  GLUCOSE 266* 195*  BUN 74* 53*  CREATININE 4.89* 3.31*  CALCIUM 9.4 9.7  PHOS 3.9 3.4     CBC: Recent Labs  Lab 01/16/22 0916 01/20/22 0434  WBC 11.1* 11.9*  HGB 7.0* 7.8*  HCT 22.2* 25.1*  MCV 92.1 93.7  PLT 253 279      Urinalysis: No results for input(s): "COLORURINE", "LABSPEC", "PHURINE", "GLUCOSEU", "HGBUR", "BILIRUBINUR", "KETONESUR", "PROTEINUR", "UROBILINOGEN", "NITRITE", "LEUKOCYTESUR" in the last 72 hours.  Invalid input(s): "APPERANCEUR"    Imaging: No results  found.   Medications:    sodium chloride Stopped (01/19/22 1845)    (feeding supplement) PROSource Plus  30 mL Oral TID BM   vitamin C  500 mg Oral BID   Chlorhexidine Gluconate Cloth  6 each Topical Q0600   epoetin alfa       epoetin (EPOGEN/PROCRIT) injection  10,000 Units Intravenous Q M,W,F-HD   fluticasone  2 spray Each Nare Daily   furosemide  40 mg Oral Daily   gabapentin  100 mg Oral TID   heparin sodium (porcine)       insulin aspart  0-5 Units Subcutaneous QHS   insulin aspart  0-9 Units Subcutaneous TID WC   insulin aspart  5 Units Subcutaneous TID WC   insulin glargine-yfgn  8 Units Subcutaneous BID   leptospermum manuka honey  1 application  Topical Daily   lidocaine-EPINEPHrine  20 mL Intradermal Once   loratadine  10 mg Oral Daily   metoprolol succinate  50 mg Oral BID   multivitamin  1 tablet Oral QHS   nutrition supplement (JUVEN)  1 packet Oral BID BM    Assessment/ Plan:     Principal Problem:   Severe sepsis (HCC) Active Problems:   OSA (obstructive sleep apnea)   Chronic kidney disease, stage 5 ([place])   Essential hypertension   DNR (do not resuscitate)/DNI(Do Not Intubate)   Type 2 diabetes mellitus with chronic kidney disease, with long-term current use of insulin (HCC)   Hypomagnesemia   Pressure injury of skin  Symptomatic anemia   Elevated LFTs   Sacral wound, initial encounter   Swelling of left lower extremity   Abdominal pain   AKI (acute kidney injury) ([place])   Hyponatremia   Premature atrial complexes   Anasarca associated with disorder of kidney   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  Patient is reached end-stage renal disease and was started on renal replacement therapy.   2D echo from January 03, 2022-:LVEF 60 to 65%, no regional wall motion abnormalities, asymmetric left  ventricular hypertrophy of the basal septal segment.  Diastolic parameters normal.   #: End-stage renal disease: Patient has a permacath   Receiving scheduled treatment now, UF goal 0.5 L.  Improved lower extremity edema and patient states she is not urinating as often, will decrease desired UF to encourage urination.   Per chart review, patient was able to tolerate sitting 2 hours in a chair yesterday.  This is encouraging.  We will continue to monitor this progress.  For outpatient dialysis, patient will be seated in a chair for at least 4 to 5 hours consecutively.   #: Sepsis/fever likely from sacral wound. Biopsy negative for calciphylaxis. Prescribed oral Keflex.  Negative pressure wound VAC remains in place and managed by surgery team.  We will discontinue sodium thiosulfate at this time.   #: Anemia secondary to chronic kidney disease complicated by sepsis. Has received blood transfusions during this admission.  -Hgb 7.8. EPO with dialysis treatments.   #: Secondary hyperparathyroidism: We are continuing to monitor bone minerals during this admission.  # Diabetes with CKD Lab Results  Component Value Date   HGBA1C 10.0 (H) 12/18/2021   Poorly controlled Diabetes Glucose remains elevated, primary team to manage SSI    LOS: [facility] 6/21/202311:10 AM

## 2022-01-22 DIAGNOSIS — A419 Sepsis, unspecified organism: Secondary | ICD-10-CM | POA: Diagnosis not present

## 2022-01-22 DIAGNOSIS — Z7189 Other specified counseling: Secondary | ICD-10-CM

## 2022-01-22 DIAGNOSIS — R52 Pain, unspecified: Secondary | ICD-10-CM | POA: Diagnosis not present

## 2022-01-22 DIAGNOSIS — R6520 Severe sepsis without septic shock: Secondary | ICD-10-CM | POA: Diagnosis not present

## 2022-01-22 LAB — HEPATIC FUNCTION PANEL
ALT: 58 U/L — ABNORMAL HIGH (ref 0–44)
AST: 66 U/L — ABNORMAL HIGH (ref 15–41)
Albumin: 2.4 g/dL — ABNORMAL LOW (ref 3.5–5.0)
Alkaline Phosphatase: 174 U/L — ABNORMAL HIGH (ref 38–126)
Bilirubin, Direct: 0.1 mg/dL (ref 0.0–0.2)
Indirect Bilirubin: 0.6 mg/dL (ref 0.3–0.9)
Total Bilirubin: 0.7 mg/dL (ref 0.3–1.2)
Total Protein: 6.5 g/dL (ref 6.5–8.1)

## 2022-01-22 LAB — GLUCOSE, CAPILLARY
Glucose-Capillary: 132 mg/dL — ABNORMAL HIGH (ref 70–99)
Glucose-Capillary: 212 mg/dL — ABNORMAL HIGH (ref 70–99)
Glucose-Capillary: 214 mg/dL — ABNORMAL HIGH (ref 70–99)
Glucose-Capillary: 147 mg/dL — ABNORMAL HIGH (ref 70–99)

## 2022-01-22 NOTE — Consult Note (Cosign Needed Addendum)
Consultation Note Date: 01/22/2022   Patient Name: Theresa Warren  DOB: 02/06/1956  MRN: [id number]  Age / Sex: 66 y.o., female  PCP: Crist Infante, MD Referring Physician: Nolberto Hanlon, MD  Reason for Consultation: Establishing goals of care and pain  HPI/Patient Profile:Theresa Warren is a 66 year old female hyperlipidemia, hypertension, CKD stage V, insulin-dependent diabetes mellitus, who presents emergency department from [facility] for evaluation of low sodium noted on labs at the facility.  She was discharged to rehab on 5/23 after being admitted following a fall and also with acute on chronic anemia.   Clinical Assessment and Goals of Care: Notes and labs reviewed. In to see patient. Theresa Warren is at bedside. Patient states she is trying to balance her bills before taking pain medication which makes her sleepy.   We discuss the advance directive which was just completed. Theresa Warren is patient's HPOA. She confirms DNR/DNI. She discusses she would not want to live in a vegetative state, and would just want to be kept comfortable. She would like to continue current care at this time, and is amenable to dialysis. She is awaiting results of skin biopsy.   She discusses pain in the legs that is cramping in nature, and comes in waves. Would recommend Flexeril for muscle cramps.     SUMMARY OF RECOMMENDATIONS   --    Would recommend Flexeril for cramping wave like pain in extremities.  --    Agree with current Oxycodone use. Would recommend 5mg  for moderate pain and 10mg  for severe pain.  Would recommend staff administering smaller doses of Oxycodone more frequently (5mg  every 4 hours) than larger doses further apart (10 mg every 7-12 hours). Discussed with patient using medication as needed and being mindful of delaying dosing as pain will increase, and oral meds take more time to work. --    Agree  with PRN IV Morphine, but would use only for dressing changes or breakthrough pain, and only after patient has received regular Oxycodone dosing, and no sooner than 45 minutes from most recent Oxycodone dose.    DNR/DNI. Treat the treatable.   I will follow up Monday on my return.        Primary Diagnoses: Present on Admission:  Symptomatic anemia  Elevated LFTs  Severe sepsis (HCC)  OSA (obstructive sleep apnea)  DNR (do not resuscitate)/DNI(Do Not Intubate)  Essential hypertension  Sacral wound, initial encounter  Swelling of left lower extremity  Abdominal pain  Chronic kidney disease, stage 5 (HCC)  AKI (acute kidney injury) ([place])  Hyponatremia  Pressure injury of skin  Premature atrial complexes  Hypomagnesemia   I have reviewed the medical record, interviewed the patient and family, and examined the patient. The following aspects are pertinent.  Past Medical History:  Diagnosis Date   CKD (chronic kidney disease), stage III ([place])    Diabetes ([place])    Hyperlipidemia    Hypertension    Obesity    Social History   Socioeconomic History   Marital status: Divorced  Spouse name: Not on file   Number of children: Not on file   Years of education: Not on file   Highest education level: Not on file  Occupational History   Occupation: Works [employer]  Tobacco Use   Smoking status: Never   Smokeless tobacco: Not on file  Substance and Sexual Activity   Alcohol use: Not Currently   Drug use: Not Currently   Sexual activity: Not Currently  Other Topics Concern   Not on file  Social History Narrative   Lives alone   Social Determinants of Health   Financial Resource Strain: Not on file  Food Insecurity: Not on file  Transportation Needs: Not on file  Physical Activity: Not on file  Stress: Not on file  Social Connections: Not on file   Family History  Problem Relation Age of Onset   Alzheimer's disease Father    Diabetes Father    CAD Father 24    CAD Other        Multliple maternal aunts and uncles with early onset heart disease   Lung cancer Sister    ALS Mother    Scheduled Meds:  (feeding supplement) PROSource Plus  30 mL Oral TID BM   vitamin C  500 mg Oral BID   Chlorhexidine Gluconate Cloth  6 each Topical Q0600   epoetin (EPOGEN/PROCRIT) injection  10,000 Units Intravenous Q M,W,F-HD   fluticasone  2 spray Each Nare Daily   furosemide  40 mg Oral Daily   gabapentin  100 mg Oral TID   insulin aspart  0-5 Units Subcutaneous QHS   insulin aspart  0-9 Units Subcutaneous TID WC   insulin aspart  5 Units Subcutaneous TID WC   insulin glargine-yfgn  8 Units Subcutaneous BID   leptospermum manuka honey  1 application  Topical Daily   lidocaine-EPINEPHrine  20 mL Intradermal Once   loratadine  10 mg Oral Daily   metoprolol succinate  50 mg Oral BID   multivitamin  1 tablet Oral QHS   nutrition supplement (JUVEN)  1 packet Oral BID BM   Continuous Infusions:  sodium chloride Stopped (01/19/22 1845)   PRN Meds:.sodium chloride, acetaminophen, alteplase, heparin, hydrALAZINE, HYDROmorphone (DILAUDID) injection, lidocaine (PF), lidocaine-prilocaine, morphine injection, Muscle Rub, ondansetron **OR** ondansetron (ZOFRAN) IV, ondansetron (ZOFRAN) IV, oxyCODONE, pentafluoroprop-tetrafluoroeth Medications Prior to Admission:  Prior to Admission medications   Medication Sig Start Date End Date Taking? Authorizing Provider  allopurinol (ZYLOPRIM) 100 MG tablet Take 100 mg by mouth daily.    [provider]  atorvastatin (LIPITOR) 40 MG tablet Take 40 mg by mouth daily.    [provider]  fexofenadine (ALLEGRA) 180 MG tablet Take 180 mg by mouth daily.    [provider]  fluticasone (FLONASE) 50 MCG/ACT nasal spray Place 2 sprays into both nostrils daily. 11/27/14 12/19/21  Vilinda Boehringer, MD  furosemide (LASIX) 20 MG tablet Take 20 mg by mouth daily.    [provider]  HUMULIN R 500 UNIT/ML SOLN  injection Inject 25-30 Units into the skin 2 (two) times daily with a meal. Patient reports she uses Humulin R U500 vials and U100 insulin syringe. Draws up to 30 unit mark on U100 syringe QAM, Humulin R U500 25 unit mark on U100 syringe QPM 07/10/14   [provider]  losartan (COZAAR) 50 MG tablet Take 1 tablet (50 mg total) by mouth daily. 12/24/21   Sharen Hones, MD  metoprolol tartrate (LOPRESSOR) 25 MG tablet Take 1 tablet (  25 mg total) by mouth 2 (two) times daily. 12/23/21   Sharen Hones, MD  nystatin (MYCOSTATIN/NYSTOP) powder Apply topically. 10/28/21   [provider]  Omega-3 Fatty Acids (FISH OIL) 500 MG CAPS Take by mouth.    [provider]  sodium bicarbonate 650 MG tablet Take 1 tablet (650 mg total) by mouth 2 (two) times daily. 12/23/21 12/23/22  Sharen Hones, MD  triamcinolone cream (KENALOG) 0.1 % Apply 1 application. topically 3 (three) times daily. 11/12/21   [provider]  Vitamin D, Ergocalciferol, (DRISDOL) 50000 UNITS CAPS capsule Take 1 capsule by mouth 2 (two) times a week. 07/22/14   [provider]   Allergies  Allergen Reactions   Codeine Nausea And Vomiting   Sulfa Antibiotics Swelling   Clindamycin/Lincomycin Rash   Penicillins Rash   Review of Systems  Constitutional:        Pain from sitting    Physical Exam Pulmonary:     Effort: Pulmonary effort is normal.  Neurological:     Mental Status: She is alert.     Vital Signs: BP (!) 128/55 (BP Location: Right Arm)   Pulse 95   Temp 98.8 F (37.1 C) (Oral)   Resp 18   Ht 5\' 3"  (1.6 m)   Wt 107 kg   SpO2 99%   BMI 41.79 kg/m  Pain Scale: 0-10 POSS *See Group Information*: 1-Acceptable,Awake and alert Pain Score: 6    SpO2: SpO2: 99 % O2 Device:SpO2: 99 % O2 Flow Rate: .O2 Flow Rate (L/min): 3 L/min  IO: Intake/output summary:  Intake/Output Summary (Last 24 hours) at 01/22/2022 1609 Last data filed at 01/21/2022 2300 Gross per 24 hour  Intake 240  ml  Output --  Net 240 ml    LBM: Last BM Date : 01/20/22 Baseline Weight: Weight: 97 kg Most recent weight: Weight: 107 kg     Palliative Assessment/Data: 40%    Signed by: Asencion Gowda, NP   Please contact Palliative Medicine Team phone at [phone] for questions and concerns.  For individual provider: See Shea Evans

## 2022-01-22 NOTE — Progress Notes (Incomplete)
ID Pt says pain is betterr but on turning had severe pain In dialysis tearful Skin biopsy was done last week  O/e in distress Awake Patient Vitals for the past 24 hrs:  BP Temp Temp src Pulse Resp SpO2 Weight  01/22/22 1125 (!) 128/55 98.8 F (37.1 C) Oral 95 18 -- --  01/22/22 0800 (!) 144/76 100.2 F (37.9 C) Oral 83 20 99 % --  01/22/22 0438 -- -- -- -- -- -- 107 kg  01/22/22 0436 125/71 99.1 F (37.3 C) -- 86 18 100 % --  01/21/22 2144 -- 99.3 F (37.4 C) Oral -- -- -- --  01/21/22 2057 (!) 143/53 (!) 100.7 F (38.2 C) Oral 96 18 94 % --  01/21/22 1622 (!) 150/52 99.3 F (37.4 C) -- 99 -- 93 % --   Chest b/l air entry Hs- Tachycardia CNS non focal Skin- multiple areas of indurated skin/soft tissue over the lower extremities        B/l buttock wounds covered with wound vac- did not examine   Labs    Latest Ref Rng & Units 01/20/2022    4:34 AM 01/16/2022    9:16 AM 01/14/2022    4:06 AM  CBC  WBC 4.0 - 10.5 K/uL 11.9  11.1  15.9   Hemoglobin 12.0 - 15.0 g/dL 7.8  7.0  7.8   Hematocrit 36.0 - 46.0 % 25.1  22.2  24.8   Platelets 150 - 400 K/uL 279  253  254        Latest Ref Rng & Units 01/22/2022    4:29 AM 01/20/2022    4:34 AM 01/16/2022    9:16 AM  CMP  Glucose 70 - 99 mg/dL  195  266   BUN 8 - 23 mg/dL  53  74   Creatinine 0.44 - 1.00 mg/dL  3.31  4.89   Sodium 135 - 145 mmol/L  136  133   Potassium 3.5 - 5.1 mmol/L  3.9  3.5   Chloride 98 - 111 mmol/L  96  96   CO2 22 - 32 mmol/L  28  25   Calcium 8.9 - 10.3 mg/dL  9.7  9.4   Total Protein 6.5 - 8.1 g/dL 6.5     Total Bilirubin 0.3 - 1.2 mg/dL 0.7     Alkaline Phos 38 - 126 U/L 174     AST 15 - 41 U/L 66     ALT 0 - 44 U/L 58      6/11 BC NG 6/10 BC NG  ANA profile  neg ANCA neg Antiphospholipid pending  Gluteal wound culture- staph aureus and enterococcus  Impression/recommendation B/l buttock wounds- calciphylaxis VS pressure-  Completed 7 days of antibiotic Off antibiotics X 48  h  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 Paraneoplastic Punch biopsy from the left hip and inner thigh did not show calciphylaxis when read at [facility]- but is has been sent to [place] whether it is a sampling error ANA/ANCA neg Pt will need care at a center whether there is dermatology, so that a better biopsy can be done and there lesions can be managed accordingly   Fever- has resolved  Transaminitis ? Cause- will repeat tomorrow  Skin rash- waxes and wanes- doubt drug related  ? Autonomic   Vaginal bleed and endometrial stripe thickening- recommend GYN for endometrial biopsy to r/o malignancy  Dicussed  the management with hospitalist and nephrologist

## 2022-01-22 NOTE — Progress Notes (Signed)
Offered to change dressings while doing CHG bath, pt refused. States "I'd rather do it later this morning." CHG bath completed. Will notify oncoming nurse.

## 2022-01-22 NOTE — Progress Notes (Signed)
   01/22/22 1500  Clinical Encounter Type  Visited With Patient and family together  Visit Type Initial  Referral From Chaplain  Consult/Referral To Chaplain   Chaplain facilitated completion of Advance Directive

## 2022-01-22 NOTE — Progress Notes (Signed)
PROGRESS NOTE    Theresa Warren  BPZ:[id number] DOB: 15-Feb-1956 DOA: 12/31/2021 PCP: Crist Infante, MD    Brief Narrative:  Brief hospital course: [name] is a 66 year old female hyperlipidemia, hypertension, CKD stage V, insulin-dependent diabetes mellitus, who presents emergency department from [facility] for evaluation of low sodium noted on labs at the facility.  She was discharged to rehab on 5/23 after being admitted following a fall and also with acute on chronic anemia.   ED Course - temp 99.6, HR 107, otherwise unremarkable vitals. Labs with sodium 129, potassium 5.1, chloride 99, bicarb 17, BUN of 100, serum creatinine of 4.61, GFR of 10, WBC 18.4, hemoglobin 6.9, platelets of 473.  LFT's were also elevated.   Patient met criteria for sepsis on arrival, treated per protocol in the ED with IV Cefepime, Flagyl, Vanc, and LR 1 L bolus fluids.  Infection source unclear but felt possibly due to sacral wound infection and concern for underlying osteomyelitis.   Admitted to the hospitalist service for further evaluation and management.  Continued on IV Cefepime and Vanc.   General surgery was consulted for debridement on sacral pressure injury.  Nephrology consulted due to worsening renal function.    Weeks summary from prior provider. Patient was lethargic and displayed uremic symptoms.  She was started on hemodialysis after tunneled dialysis catheter was placed by vascular surgery on 6/9.  First dialysis was started on 6/9.  In the evening she was febrile and has been spiking fevers for the past few days.  She was placed on IV meropenem broad-spectrum and ID was consulted.  Also she has left thigh induration wounds soft tissue ultrasound was negative and she was also negative for bilateral DVTs.   6/14: 2 separate wound cultures, 1 with MSSA and Enterococcus faecalis and second with MSSA and Staph epidermidis resistant to methicillin.  Antibiotics were switched to  meropenem.  ID is on board.  Patient had multiple wound debridements. 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 Autoimmune and vasculitis labs pending.  CK low at 10, acute hepatitis panel negative. Patient also has postmenopausal vaginal bleeding resulted in blood loss anemia, endometrial thickening on scan which will required outpatient gynecologic evaluation to rule out malignancy.  So far received 2 unit of PRBC.   6/15: Her wound continued to get worse.  Skin biopsy for concern of calciphylaxis was done by general surgery on 01/14/2022.  New pictures in ID note. ANA comprehensive profile was negative.  Rest of the autoimmune labs are pending.   6/16: ANCA profile negative.  Antiphospholipid antibody and skin biopsy is still pending. CBG elevated, increased Semglee to twice daily and added mealtime coverage. Preliminary skin biopsy reports with no calciphylaxis, some microthrombosis, slight sent to [facility] for dermatology department review. Multiple deep wounds and necrosis.  See today's surgery progress note for new pictures.   6/17: Patient antiphospholipid syndrome profile with negative anticardiolipin IgG and IgM but positive PTT lupus anticoagulant and DRVVT, on further characterization DRVVT mix was slightly elevated with normal DRVVT confirm.  Final skin biopsy results still pending.   6/18: CBG remained elevated, ongoing adjustment to her insulin.  Had a curbside consult with different rheumatologist and according to her recommendations she was suggesting stopping heparin for couple of days and repeat PTT lupus anticoagulant.  Patient has very nonspecific testing and does not make the cutoff for antiphospholipid syndrome.   6/19: Per nursing staff patient refusing daily dressing change and keep delaying which  interferes with their workflow.  Holding heparin and we can repeat PTT lupus anticoagulant tomorrow before restarting heparin for DVT prophylaxis.  Final skin  biopsy results are still pending Continues to have significant pain-palliative care was consulted for pain management. Pulm nephrology if she cannot sit in a dialysis chair, they might not be able to offer dialysis for a long time.   6/20: Palliative care was consulted yesterday for pain management, apparently patient was not using her as needed meds appropriately and they suggested giving a few doses daily.  Lupus anticoagulant was repeated as patient was off the heparin for 2 days.  Received second dose of sodium thiosulfate during dialysis yesterday.  Skin biopsy still pending. ID stopped antibiotics and observing now without them.   6/21 Had HD earlier. Feels tired after HD now.  6/22 was given pain meds due to pain 30 min earlier, now tells me she is sleepy. No other complaints. No sob or cp. Tmax 100.7   Consultants:  Nephrology, palliative, ID  Procedures:   Antimicrobials:     Subjective: No abd pain  Objective: Vitals:   01/21/22 2057 01/21/22 2144 01/22/22 0436 01/22/22 0438  BP: (!) 143/53  125/71   Pulse: 96  86   Resp: 18  18   Temp: (!) 100.7 F (38.2 C) 99.3 F (37.4 C) 99.1 F (37.3 C)   TempSrc: Oral Oral    SpO2: 94%  100%   Weight:    107 kg  Height:        Intake/Output Summary (Last 24 hours) at 01/22/2022 0815 Last data filed at 01/21/2022 2300 Gross per 24 hour  Intake 240 ml  Output 500 ml  Net -260 ml   Filed Weights   01/21/22 1028 01/21/22 1400 01/22/22 0438  Weight: 111 kg 110 kg 107 kg    Examination: Calm, NAD, appears tired and relaxed Cta no w/r Reg s1/s2 no gallop Soft benign +bs +edema b/l . Wound vac in  place Awake , answers questions, grosslyi ntact Mood and affect appropriate in current setting    Data Reviewed: I have personally reviewed following labs and imaging studies  CBC: Recent Labs  Lab 01/16/22 0916 01/20/22 0434  WBC 11.1* 11.9*  HGB 7.0* 7.8*  HCT 22.2* 25.1*  MCV 92.1 93.7  PLT 253 102   Basic  Metabolic Panel: Recent Labs  Lab 01/16/22 0916 01/20/22 0434  NA 133* 136  K 3.5 3.9  CL 96* 96*  CO2 25 28  GLUCOSE 266* 195*  BUN 74* 53*  CREATININE 4.89* 3.31*  CALCIUM 9.4 9.7  PHOS 3.9 3.4   GFR: Estimated Creatinine Clearance: 20.1 mL/min (A) (by C-G formula based on SCr of 3.31 mg/dL (H)). Liver Function Tests: Recent Labs  Lab 01/16/22 0916 01/20/22 0434 01/22/22 0429  AST  --   --  66*  ALT  --   --  58*  ALKPHOS  --   --  174*  BILITOT  --   --  0.7  PROT  --   --  6.5  ALBUMIN 2.3* 2.3* 2.4*   No results for input(s): "LIPASE", "AMYLASE" in the last 168 hours. No results for input(s): "AMMONIA" in the last 168 hours. Coagulation Profile: No results for input(s): "INR", "PROTIME" in the last 168 hours. Cardiac Enzymes: No results for input(s): "CKTOTAL", "CKMB", "CKMBINDEX", "TROPONINI" in the last 168 hours. BNP (last 3 results) No results for input(s): "PROBNP" in the last 8760 hours. HbA1C: No results for input(s): "  HGBA1C" in the last 72 hours. CBG: Recent Labs  Lab 01/20/22 2048 01/21/22 0804 01/21/22 1624 01/21/22 2059 01/22/22 0749  GLUCAP 185* 252* 203* 138* 132*   Lipid Profile: No results for input(s): "CHOL", "HDL", "LDLCALC", "TRIG", "CHOLHDL", "LDLDIRECT" in the last 72 hours. Thyroid Function Tests: No results for input(s): "TSH", "T4TOTAL", "FREET4", "T3FREE", "THYROIDAB" in the last 72 hours. Anemia Panel: No results for input(s): "VITAMINB12", "FOLATE", "FERRITIN", "TIBC", "IRON", "RETICCTPCT" in the last 72 hours. Sepsis Labs: No results for input(s): "PROCALCITON", "LATICACIDVEN" in the last 168 hours.  No results found for this or any previous visit (from the past 240 hour(s)).        Radiology Studies: No results found.      Scheduled Meds:  (feeding supplement) PROSource Plus  30 mL Oral TID BM   vitamin C  500 mg Oral BID   Chlorhexidine Gluconate Cloth  6 each Topical Q0600   epoetin (EPOGEN/PROCRIT)  injection  10,000 Units Intravenous Q M,W,F-HD   fluticasone  2 spray Each Nare Daily   furosemide  40 mg Oral Daily   gabapentin  100 mg Oral TID   insulin aspart  0-5 Units Subcutaneous QHS   insulin aspart  0-9 Units Subcutaneous TID WC   insulin aspart  5 Units Subcutaneous TID WC   insulin glargine-yfgn  8 Units Subcutaneous BID   leptospermum manuka honey  1 application  Topical Daily   lidocaine-EPINEPHrine  20 mL Intradermal Once   loratadine  10 mg Oral Daily   metoprolol succinate  50 mg Oral BID   multivitamin  1 tablet Oral QHS   nutrition supplement (JUVEN)  1 packet Oral BID BM   Continuous Infusions:  sodium chloride Stopped (01/19/22 1845)    Assessment & Plan:   Principal Problem:   Severe sepsis ([place]) Active Problems:   Sacral wound, initial encounter   Swelling of left lower extremity   Chronic kidney disease, stage 5 (HCC)   AKI (acute kidney injury) ([place])   Hypomagnesemia   Premature atrial complexes   Symptomatic anemia   Abdominal pain   Hyponatremia   Essential hypertension   Type 2 diabetes mellitus with chronic kidney disease, with long-term current use of insulin ([place])   DNR (do not resuscitate)/DNI(Do Not Intubate)   OSA (obstructive sleep apnea)   Pressure injury of skin   Elevated LFTs   Anasarca associated with disorder of kidney   Calciphylaxis   Severe sepsis (HCC) POA as evidenced by tachycardia, leukocytosis, AKI and transaminitis consistent with organ dysfunction and severe sepsis with suspected infection source being sacral wounds versus an abdominal source (CT abdomen pelvis negative for anything acute however).  MRSA PCR negative. Wound cultures grew few Staph aureus, rare Enterococcus faecalis. Initially treated with vancomycin/cefepime. Vanc stopped 6/4.  --Transition to p.o. Keflex to complete course, and then transition to meropenem as she became febrile again. -Repeat wound cultures with MSSA and staph epi which was  resistant to methicillin. 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 -Skin biopsy was done on 6/14 by general surgery, preliminary results are negative for calciphylaxis.  Vasculitis can be a possibility, very nonspecific elevation of lupus anticoagulant, patient was on heparin.  Holding heparin for couple of days and then we will repeat blood tomorrow -Sodium thiosulfate was given during dialysis on 6/14and 6/19 for concern of calciphylaxis -Meropenem was discontinued today and we will observe without antibiotics now. continue wound care 6/22 follow-up skin biopsy results from [facility]  Palliative following please see note Will repeat lupus anticoagulant as abve. Although did get hepain during HD       Swelling of left lower extremity Improving Initially with redness, patient attributes it to clindamycin.  She reported on admission that this has been present for over one month.   Left lower extremity Doppler US on 12/18/21 was negative for DVT. 6/22 continue to monitor Keep legs elevagted   Sacral wound, initial encounter See above. Surgery was following 6/22 continue wound vac /vac care Wound nsg following    AKI on Chronic kidney disease, stage 5 ([place]) Now transition to ESRD and patient was started on dialysis. 6/22 had HD yesterday. Nephrology following     Premature atrial complexes Patient became significantly tachycardic on 6/2 with heart rates sustaining in the 130s.  Cardiology reviewed telemetry and feels patient dysrhythmias were not A-fib but sinus tach with PACs. --Cardiology following -- Continue Lopressor 25 mg BID     Hypomagnesemia Resolved with replacement on 6/3. Monitor and replace Mg as needed.     Symptomatic anemia Hemoglobin on admission was 6.9 >> 6.3, down from 7.6 at time of discharge on 5/23.  No signs of active bleeding, did have vaginal/endometrial bleeding previous admission but no reports of this recently.  May  have slow chronic blood loss from sacral wounds.  Has baseline anemia of chronic disease due to renal failure. Received 2 unit PRBC transfusion . -Continue to monitor -Transfuse if below 7   Abdominal pain Patient reported on admission.  CT of abdomen pelvis showed abnormal endometrial thickening for a postmenopausal patient, bladder distention estimated 520 mL, no other acute or inflammatory processes or acute findings. --Monitor clinically for now -- Needs OB/GYN follow-up for endometrial sampling to exclude endometrial carcinoma   Hyponatremia Resolved.  Presented with sodium 129.  This was noted on labs at [facility] and was the reason patient presented to the ED. improved with IV hydration. -Continue to monitor   Essential hypertension Blood pressure mildly elevated. -Continue with Lasix, metoprolol and hydralazine   Type 2 diabetes mellitus with chronic kidney disease, with long-term current use of insulin (HCC) CBG remained elevated, at 321 this morning -Increase Semglee to 8 units twice daily -Increase mealtime coverage to 5 units -Continue with SSI   DNR (do not resuscitate)/DNI(Do Not Intubate) Admitting hospitalist confirmed DNR CODE STATUS with patient during admission encounter   Calciphylaxis Concern of calciphylaxis due to nonhealing and worsening wound with necrosis. -Skin biopsy obtained, preliminary results were negative for calciphylaxis derm pathology from [facility] pending      Anasarca associated with disorder of kidney Nephrology following. Treated with Lasix IV, later Lasix drip, IV albumin.  Nephrology switched back to PO Lasix today.  Monitor I/O's & Daily Wts   Elevated LFTs On admission, alk phos 212, AST 182, ALT 107.  Unclear etiology, repeat LFTs not available today.  Appears LFTs were elevated at time of discharge on 5/23 but have increased since. RUQ ultrasound unremarkable. Acute hepatitis panel negative. -- Follow CMP periodically   Pressure injury of  skin Present on admission.  Patient has multiple sacral pressure injuries.  Some concern at time of admission that these were the source of sepsis. General surgery consulted and took patient to the OR on 6/1 for debridement.   Now with wound VAC. --Initial wound VAC change Monday 6/5 then every M/W/F -- Wound care consulted -- Frequent repositioning --Follow general surgery recommendations --Pain control --Follow blood cultures Pressure Injury 01/01/22 Buttocks Left  Stage 3 -  Full thickness tissue loss. Subcutaneous fat may be visible but bone, tendon or muscle are NOT exposed. (Active)  01/01/22 0222  Location: Buttocks  Location Orientation: Left  Staging: Stage 3 -  Full thickness tissue loss. Subcutaneous fat may be visible but bone, tendon or muscle are NOT exposed.  Wound Description (Comments):   Present on Admission: Yes     Pressure Injury 01/01/22 Buttocks Right Stage 2 -  Partial thickness loss of dermis presenting as a shallow open injury with a red, pink wound bed without slough. (Active)  01/01/22 0223  Location: Buttocks  Location Orientation: Right  Staging: Stage 2 -  Partial thickness loss of dermis presenting as a shallow open injury with a red, pink wound bed without slough.  Wound Description (Comments):   Present on Admission: Yes     Pressure Injury 01/01/22 Hip Left Stage 2 -  Partial thickness loss of dermis presenting as a shallow open injury with a red, pink wound bed without slough. (Active)  01/01/22 0225  Location: Hip  Location Orientation: Left  Staging: Stage 2 -  Partial thickness loss of dermis presenting as a shallow open injury with a red, pink wound bed without slough.  Wound Description (Comments):   Present on Admission: Yes     Pressure Injury 01/01/22 Hip Right Stage 3 -  Full thickness tissue loss. Subcutaneous fat may be visible but bone, tendon or muscle are NOT exposed. (Active)  01/01/22 0225  Location: Hip  Location Orientation:  Right  Staging: Stage 3 -  Full thickness tissue loss. Subcutaneous fat may be visible but bone, tendon or muscle are NOT exposed.  Wound Description (Comments):   Present on Admission: Yes          OSA (obstructive sleep apnea) .  Appears not to be on CPAP     DVT prophylaxis: scd Code Status:dnr Family Communication: none at bedside Disposition Plan: SNF Status is: Inpatient Remains inpatient appropriate because: w/u pending      LOS: 21 days   Time spent: 30 min    Nolberto Hanlon, MD [facility] Pager [phone]  If 7PM-7AM, please contact night-coverage 01/22/2022, 8:15 AM

## 2022-01-22 NOTE — Progress Notes (Signed)
Patient had assisted fall occur with RN and CNA at bedside to assist patient to floor. RN and CNA attempted to get patient back to bed from chair with walker. Patient stood up, became weak and could not hold strength in legs anymore. Staff safely lowered patient to ground. No injuries, vitals stable, MD notified. Family at bedside during event as well. Pain meds given to patient. Will continue to monitor.

## 2022-01-22 NOTE — TOC Progression Note (Addendum)
Transition of Care [facility]) - Progression Note    Patient Details  Name: Zhaniya Odonoghue MRN: [id number] Date of Birth: 30-Jul-1956  [facility] CM/SW Contact  Marlowe Sax, RN Phone Number: 01/22/2022, 8:57 AM  Clinical Narrative:     I met with the patient this morning at the bedside, She stated her pain is tolerable right now, I explained to her that we are awaiting the biopsy results, she is still talking with her friend Darl Pikes about being the HCPOA, I explained that she can come to the hospital and the Chaplain can help with the papers for that.  She stated that her bed is not working properly, she stated that it is not inflating correctly, She stated that she has notified the nurse.  I went to speak to the nurse, she is in another room, I left my contact information with the Secretary and requested a call  The bedside nurse called and I shared the information, she stated understanding and that the bed is actually working  Expected Discharge Plan: Skilled Nursing Facility Barriers to Discharge: Continued Medical Work up  Expected Discharge Plan and Services Expected Discharge Plan: Skilled Nursing Facility   Discharge Planning Services: CM Consult Post Acute Care Choice:  (TBD) Living arrangements for the past 2 months: Single Family Home                                       Social Determinants of Health (SDOH) Interventions    Readmission Risk Interventions     No data to display

## 2022-01-22 NOTE — Progress Notes (Signed)
Physical Therapy Treatment Patient Details Name: Theresa Warren MRN: [id number] DOB: 19-Aug-1955 Today's Date: 01/22/2022   History of Present Illness Theresa Warren is a 66 year old female admitted for evaluation of low sodium noted on labs at the facility.  Pt was discharged  to rehab on 5/23 after being admitted following a fall. PMH significant for acute on chronic anemia. hyperlipidemia, hypertension, CKD stage V, insulin-dependent diabetes mellitus.    PT Comments    Pt long sitting in bed, OT in room setting up a plan for today's session. OT/PT co-treat performed for pt safety as she had a near fall in the recent past during a transfer. Increased time was required for pt to process that log roll would be a safer choice regarding her sacral wounds to get out of bed. Once agreeable, pt required +2 assist to lift trunk to upright. She was able to stand, stand-pivot transfer and ambulate 2 feet, all with MIN Ax2. Heavy steadying and guarding provided. Pt was motivated to ambulate this date, exceeding original goal set. She had difficulty boosting hips back in recliner. Total Ax2 provided by PT and OT for improved positioning and pressure distribution. Pt agreeable to sittingin chair for 1 hour until 11:30. Nursing staff was made aware. D/c recs remain appropriate. Would benefit from skilled PT to address above deficits and promote optimal return to PLOF.    Recommendations for follow up therapy are one component of a multi-disciplinary discharge planning process, led by the attending physician.  Recommendations may be updated based on patient status, additional functional criteria and insurance authorization.  Follow Up Recommendations  Skilled nursing-short term rehab (<3 hours/day) Can patient physically be transported by private vehicle: No   Assistance Recommended at Discharge Frequent or constant Supervision/Assistance  Patient can return home with the following Two people to  help with walking and/or transfers;Two people to help with bathing/dressing/bathroom;Assistance with cooking/housework;Assist for transportation;Help with stairs or ramp for entrance   Equipment Recommendations  Rolling walker (2 wheels);BSC/3in1;Wheelchair cushion (measurements PT);Wheelchair (measurements PT);Hospital bed    Recommendations for Other Services       Precautions / Restrictions Precautions Precautions: Fall Precaution Comments: sacral wounds Restrictions Weight Bearing Restrictions: No     Mobility  Bed Mobility Overal bed mobility: Needs Assistance Bed Mobility: Rolling, Sidelying to Sit Rolling: Mod assist Sidelying to sit: Mod assist, +2 for safety/equipment, +2 for physical assistance       General bed mobility comments: Log roll - assist at hip during roll; MOD A +2 to lift trunk to upright. Increased time for pt to allow PT/OT assistance.    Transfers Overall transfer level: Needs assistance Equipment used: Rolling walker (2 wheels) Transfers: Sit to/from Stand, Bed to chair/wheelchair/BSC Sit to Stand: Min assist, +2 safety/equipment, From elevated surface Stand pivot transfers: Min assist, +2 safety/equipment         General transfer comment: STS x2 reps from elevated surfaces (EOB and recliner with pillows); no lifting assist required. Steadying was provided at all times for pt safety. Pt was unable to boost her hips back in the recliner, PT/OT provided total A for positioning in recliner.    Ambulation/Gait Ambulation/Gait assistance: Min assist Gait Distance (Feet): 2 Feet Assistive device: Rolling walker (2 wheels) Gait Pattern/deviations: Step-through pattern, Decreased stride length, Trunk flexed, Shuffle Gait velocity: decreased     General Gait Details: Pt ambulates 2 feet forward with close chair follow. Decreased stride length and foot clearance bilaterally.   Stairs  Wheelchair Mobility    Modified Rankin  (Stroke Patients Only)       Balance Overall balance assessment: Needs assistance Sitting-balance support: No upper extremity supported, Feet supported Sitting balance-Leahy Scale: Good Sitting balance - Comments: steady sitting on EOB and edge of recliner; PT remained close to ensure no sliding forward   Standing balance support: Bilateral upper extremity supported, During functional activity, Reliant on assistive device for balance Standing balance-Leahy Scale: Poor Standing balance comment: assist for balance with standing activities                            Cognition Arousal/Alertness: Awake/alert Behavior During Therapy: WFL for tasks assessed/performed Overall Cognitive Status: Within Functional Limits for tasks assessed                           Safety/Judgement: Decreased awareness of safety   Problem Solving: Slow processing General Comments: Emotions varied from frustrated to joking with PT/OT. Pt followed commands but with significantly increased time. Multiple instances of redirection to task due to slow processing.        Exercises      General Comments        Pertinent Vitals/Pain Pain Assessment Pain Assessment: Faces Faces Pain Scale: Hurts even more Pain Location: sacrum Pain Descriptors / Indicators: Discomfort Pain Intervention(s): Limited activity within patient's tolerance, Monitored during session, Repositioned    Home Living                          Prior Function            PT Goals (current goals can now be found in the care plan section) Acute Rehab PT Goals Patient Stated Goal: to improve strength and mobility PT Goal Formulation: With patient Time For Goal Achievement: 02/03/22 Potential to Achieve Goals: Fair    Frequency    Min 2X/week      PT Plan      Co-evaluation PT/OT/SLP Co-Evaluation/Treatment: Yes Reason for Co-Treatment: Necessary to address cognition/behavior during  functional activity;For patient/therapist safety;To address functional/ADL transfers PT goals addressed during session: Mobility/safety with mobility;Balance;Proper use of DME OT goals addressed during session: ADL's and self-care;Proper use of Adaptive equipment and DME      AM-PAC PT "6 Clicks" Mobility   Outcome Measure  Help needed turning from your back to your side while in a flat bed without using bedrails?: A Lot Help needed moving from lying on your back to sitting on the side of a flat bed without using bedrails?: A Lot Help needed moving to and from a bed to a chair (including a wheelchair)?: A Lot Help needed standing up from a chair using your arms (e.g., wheelchair or bedside chair)?: A Lot Help needed to walk in hospital room?: A Lot Help needed climbing 3-5 steps with a railing? : Total 6 Click Score: 11    End of Session Equipment Utilized During Treatment: Gait belt Activity Tolerance: Patient limited by fatigue Patient left: in chair;with call bell/phone within reach;with chair alarm set Nurse Communication: Mobility status;Precautions (pt agreeance to remain in chair until 11:30am) PT Visit Diagnosis: Unsteadiness on feet (R26.81);Muscle weakness (generalized) (M62.81);Difficulty in walking, not elsewhere classified (R26.2)     Time: 7829-5621 PT Time Calculation (min) (ACUTE ONLY): 33 min  Charges:  $Therapeutic Activity: 8-22 mins  Patrina Levering PT, DPT

## 2022-01-22 NOTE — Progress Notes (Signed)
[facility]  PROGRESS NOTE   Subjective:   Patient seen sitting up in bed Nursing student, instructor and floor nurse at bedside Complains of discomfort with recent repositioning. Appetite appropriate   Objective:  Vital signs: Blood pressure (!) 144/76, pulse 83, temperature 100.2 F (37.9 C), temperature source Oral, resp. rate 20, height 5\' 3"  (1.6 m), weight 107 kg, SpO2 99 %.  Intake/Output Summary (Last 24 hours) at 01/22/2022 1010 Last data filed at 01/21/2022 2300 Gross per 24 hour  Intake 240 ml  Output 500 ml  Net -260 ml    Filed Weights   01/21/22 1028 01/21/22 1400 01/22/22 0438  Weight: 111 kg 110 kg 107 kg     Physical Exam: General:  No acute distress  Head:  Normocephalic, atraumatic. Moist oral mucosal membranes  Eyes:  Anicteric  Lungs:   Clear to auscultation, normal effort  Heart:  S1S2 no rubs  Abdomen:   Soft, nontender, bowel sounds present  Extremities:  + peripheral edema.  Neurologic:  Awake, alert, following commands  Skin:  No lesions, sacral wound with NPWV  Access: RT Permcath    Basic Metabolic Panel: Recent Labs  Lab 01/16/22 0916 01/20/22 0434  NA 133* 136  K 3.5 3.9  CL 96* 96*  CO2 25 28  GLUCOSE 266* 195*  BUN 74* 53*  CREATININE 4.89* 3.31*  CALCIUM 9.4 9.7  PHOS 3.9 3.4     CBC: Recent Labs  Lab 01/16/22 0916 01/20/22 0434  WBC 11.1* 11.9*  HGB 7.0* 7.8*  HCT 22.2* 25.1*  MCV 92.1 93.7  PLT 253 279      Urinalysis: No results for input(s): "COLORURINE", "LABSPEC", "PHURINE", "GLUCOSEU", "HGBUR", "BILIRUBINUR", "KETONESUR", "PROTEINUR", "UROBILINOGEN", "NITRITE", "LEUKOCYTESUR" in the last 72 hours.  Invalid input(s): "APPERANCEUR"    Imaging: No results found.   Medications:    sodium chloride Stopped (01/19/22 1845)    (feeding supplement) PROSource Plus  30 mL Oral TID BM   vitamin C  500 mg Oral BID   Chlorhexidine Gluconate Cloth  6 each Topical Q0600   epoetin  (EPOGEN/PROCRIT) injection  10,000 Units Intravenous Q M,W,F-HD   fluticasone  2 spray Each Nare Daily   furosemide  40 mg Oral Daily   gabapentin  100 mg Oral TID   insulin aspart  0-5 Units Subcutaneous QHS   insulin aspart  0-9 Units Subcutaneous TID WC   insulin aspart  5 Units Subcutaneous TID WC   insulin glargine-yfgn  8 Units Subcutaneous BID   leptospermum manuka honey  1 application  Topical Daily   lidocaine-EPINEPHrine  20 mL Intradermal Once   loratadine  10 mg Oral Daily   metoprolol succinate  50 mg Oral BID   multivitamin  1 tablet Oral QHS   nutrition supplement (JUVEN)  1 packet Oral BID BM    Assessment/ Plan:     Principal Problem:   Severe sepsis (HCC) Active Problems:   OSA (obstructive sleep apnea)   Chronic kidney disease, stage 5 ([place])   Essential hypertension   DNR (do not resuscitate)/DNI(Do Not Intubate)   Type 2 diabetes mellitus with chronic kidney disease, with long-term current use of insulin (HCC)   Hypomagnesemia   Pressure injury of skin   Symptomatic anemia   Elevated LFTs   Sacral wound, initial encounter   Swelling of left lower extremity   Abdominal pain   AKI (acute kidney injury) ([place])   Hyponatremia   Premature atrial complexes   Anasarca associated  with disorder of kidney   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  Patient is reached end-stage renal disease and was started on renal replacement therapy.   2D echo from January 03, 2022-:LVEF 60 to 65%, no regional wall motion abnormalities, asymmetric left ventricular hypertrophy of the basal septal segment.  Diastolic parameters normal.   #: End-stage renal disease: Patient has a permacath   Dialysis received yesterday, UF 578ml achieved. Tolerated treatment well. Next treatment scheduled for Friday.   Continue to encourage patient  to sit in chair to build tolerance for outpatient treatments.       #: Sepsis/fever likely from sacral wound. Biopsy negative for calciphylaxis. Prescribed oral Keflex.  Negative pressure wound VAC remains in place and managed by surgery team.  ID following, Derm consulted   #: Anemia secondary to chronic kidney disease complicated by sepsis. Has received blood transfusions during this admission.  -Will obtain updated labs in the am. EPO with dialysis treatments.   #: Secondary hyperparathyroidism: Will recheck labs in am   # Diabetes with CKD Lab Results  Component Value Date   HGBA1C 10.0 (H) 12/18/2021   Poorly controlled Diabetes Glucose remains elevated, primary team to manage SSI    LOS: [facility] 6/22/202310:10 AM

## 2022-01-23 DIAGNOSIS — L89313 Pressure ulcer of right buttock, stage 3: Secondary | ICD-10-CM | POA: Diagnosis not present

## 2022-01-23 DIAGNOSIS — A419 Sepsis, unspecified organism: Secondary | ICD-10-CM | POA: Diagnosis not present

## 2022-01-23 DIAGNOSIS — L989 Disorder of the skin and subcutaneous tissue, unspecified: Secondary | ICD-10-CM

## 2022-01-23 DIAGNOSIS — R6520 Severe sepsis without septic shock: Secondary | ICD-10-CM

## 2022-01-23 DIAGNOSIS — N186 End stage renal disease: Secondary | ICD-10-CM | POA: Diagnosis not present

## 2022-01-23 DIAGNOSIS — L89323 Pressure ulcer of left buttock, stage 3: Secondary | ICD-10-CM | POA: Diagnosis not present

## 2022-01-23 DIAGNOSIS — R21 Rash and other nonspecific skin eruption: Secondary | ICD-10-CM | POA: Diagnosis not present

## 2022-01-23 DIAGNOSIS — Z992 Dependence on renal dialysis: Secondary | ICD-10-CM | POA: Diagnosis not present

## 2022-01-23 LAB — CBC
HCT: 23.5 % — ABNORMAL LOW (ref 36.0–46.0)
Hemoglobin: 7.4 g/dL — ABNORMAL LOW (ref 12.0–15.0)
MCH: 28.8 pg (ref 26.0–34.0)
MCHC: 31.5 g/dL (ref 30.0–36.0)
MCV: 91.4 fL (ref 80.0–100.0)
Platelets: 240 10*3/uL (ref 150–400)
RBC: 2.57 MIL/uL — ABNORMAL LOW (ref 3.87–5.11)
RDW: 15.5 % (ref 11.5–15.5)
WBC: 15.1 10*3/uL — ABNORMAL HIGH (ref 4.0–10.5)
nRBC: 0 % (ref 0.0–0.2)

## 2022-01-23 LAB — BASIC METABOLIC PANEL
Anion gap: 13 (ref 5–15)
BUN: 61 mg/dL — ABNORMAL HIGH (ref 8–23)
CO2: 25 mmol/L (ref 22–32)
Calcium: 9.2 mg/dL (ref 8.9–10.3)
Chloride: 94 mmol/L — ABNORMAL LOW (ref 98–111)
Creatinine, Ser: 4.55 mg/dL — ABNORMAL HIGH (ref 0.44–1.00)
GFR, Estimated: 10 mL/min — ABNORMAL LOW (ref >60)
Glucose, Bld: 151 mg/dL — ABNORMAL HIGH (ref 70–99)
Potassium: 4.2 mmol/L (ref 3.5–5.1)
Sodium: 132 mmol/L — ABNORMAL LOW (ref 135–145)

## 2022-01-23 LAB — GLUCOSE, CAPILLARY
Glucose-Capillary: 149 mg/dL — ABNORMAL HIGH (ref 70–99)
Glucose-Capillary: 95 mg/dL (ref 70–99)
Glucose-Capillary: 167 mg/dL — ABNORMAL HIGH (ref 70–99)
Glucose-Capillary: 241 mg/dL — ABNORMAL HIGH (ref 70–99)

## 2022-01-23 MED ORDER — OXYCODONE HCL 5 MG PO TABS
ORAL_TABLET | ORAL | Status: AC
  Administered 2022-01-23: 5 mg via ORAL
  Filled 2022-01-23: qty 1

## 2022-01-23 MED ORDER — HEPARIN SODIUM (PORCINE) 1000 UNIT/ML IJ SOLN
INTRAMUSCULAR | Status: AC
  Administered 2022-01-23: 4200 [IU] via INTRAVENOUS_CENTRAL
  Filled 2022-01-23: qty 10

## 2022-01-23 MED ORDER — EPOETIN ALFA 10000 UNIT/ML IJ SOLN
INTRAMUSCULAR | Status: AC
  Filled 2022-01-23: qty 1

## 2022-01-23 NOTE — Progress Notes (Addendum)
PROGRESS NOTE    Theresa Warren  ZOX:[id number] DOB: 1956/05/10 DOA: 12/31/2021 PCP: Rodrigo Ran, MD    Brief Narrative:  Brief hospital course: [name] is a 66 year old female hyperlipidemia, hypertension, CKD stage V, insulin-dependent diabetes mellitus, who presents emergency department from [facility] for evaluation of low sodium noted on labs at the facility.  She was discharged to rehab on 5/23 after being admitted following a fall and also with acute on chronic anemia.   ED Course - temp 99.6, HR 107, otherwise unremarkable vitals. Labs with sodium 129, potassium 5.1, chloride 99, bicarb 17, BUN of 100, serum creatinine of 4.61, GFR of 10, WBC 18.4, hemoglobin 6.9, platelets of 473.  LFT's were also elevated.   Patient met criteria for sepsis on arrival, treated per protocol in the ED with IV Cefepime, Flagyl, Vanc, and LR 1 L bolus fluids.  Infection source unclear but felt possibly due to sacral wound infection and concern for underlying osteomyelitis.   Admitted to the hospitalist service for further evaluation and management.  Continued on IV Cefepime and Vanc.   General surgery was consulted for debridement on sacral pressure injury.  Nephrology consulted due to worsening renal function.    Weeks summary from prior provider. Patient was lethargic and displayed uremic symptoms.  She was started on hemodialysis after tunneled dialysis catheter was placed by vascular surgery on 6/9.  First dialysis was started on 6/9.  In the evening she was febrile and has been spiking fevers for the past few days.  She was placed on IV meropenem broad-spectrum and ID was consulted.  Also she has left thigh induration wounds soft tissue ultrasound was negative and she was also negative for bilateral DVTs.   6/14: 2 separate wound cultures, 1 with MSSA and Enterococcus faecalis and second with MSSA and Staph epidermidis resistant to methicillin.  Antibiotics were switched to  meropenem.  ID is on board.  Patient had multiple wound debridements. 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 Autoimmune and vasculitis labs pending.  CK low at 10, acute hepatitis panel negative. Patient also has postmenopausal vaginal bleeding resulted in blood loss anemia, endometrial thickening on scan which will required outpatient gynecologic evaluation to rule out malignancy.  So far received 2 unit of PRBC.   6/15: Her wound continued to get worse.  Skin biopsy for concern of calciphylaxis was done by general surgery on 01/14/2022.  New pictures in ID note. ANA comprehensive profile was negative.  Rest of the autoimmune labs are pending.   6/16: ANCA profile negative.  Antiphospholipid antibody and skin biopsy is still pending. CBG elevated, increased Semglee to twice daily and added mealtime coverage. Preliminary skin biopsy reports with no calciphylaxis, some microthrombosis, slight sent to [facility] for dermatology department review. Multiple deep wounds and necrosis.  See today's surgery progress note for new pictures.   6/17: Patient antiphospholipid syndrome profile with negative anticardiolipin IgG and IgM but positive PTT lupus anticoagulant and DRVVT, on further characterization DRVVT mix was slightly elevated with normal DRVVT confirm.  Final skin biopsy results still pending.   6/18: CBG remained elevated, ongoing adjustment to her insulin.  Had a curbside consult with different rheumatologist and according to her recommendations she was suggesting stopping heparin for couple of days and repeat PTT lupus anticoagulant.  Patient has very nonspecific testing and does not make the cutoff for antiphospholipid syndrome.   6/19: Per nursing staff patient refusing daily dressing change and keep delaying which  interferes with their workflow.  Holding heparin and we can repeat PTT lupus anticoagulant tomorrow before restarting heparin for DVT prophylaxis.  Final skin  biopsy results are still pending Continues to have significant pain-palliative care was consulted for pain management. Pulm nephrology if she cannot sit in a dialysis chair, they might not be able to offer dialysis for a long time.   6/20: Palliative care was consulted yesterday for pain management, apparently patient was not using her as needed meds appropriately and they suggested giving a few doses daily.  Lupus anticoagulant was repeated as patient was off the heparin for 2 days.  Received second dose of sodium thiosulfate during dialysis yesterday.  Skin biopsy still pending. ID stopped antibiotics and observing now without them.   6/21 Had HD earlier. Feels tired after HD now.  6/22 was given pain meds due to pain 30 min earlier, now tells me she is sleepy. No other complaints. No sob or cp. Tmax 100.7 6/23 seen in HD today. C/o pain around buttom and hips, nsg made aware.Noticed reddish rash like in her face today, some on her hands. Seems appears periodically ,. Wonder if medication induced.   Consultants:  Nephrology, palliative, ID  Procedures:   Antimicrobials:     Subjective: No sob or cp  Objective: Vitals:   01/22/22 1657 01/22/22 2030 01/23/22 0636 01/23/22 0736  BP: 107/74 (!) 92/52 135/69 (!) 143/74  Pulse: 94 68 95 (!) 107  Resp: 20 18 18 18   Temp: 98.8 F (37.1 C) 99.6 F (37.6 C) 99.3 F (37.4 C) 98 F (36.7 C)  TempSrc:  Oral    SpO2: 99% 97% 94% 95%  Weight:      Height:       No intake or output data in the 24 hours ending 01/23/22 0808  Filed Weights   01/21/22 1028 01/21/22 1400 01/22/22 0438  Weight: 111 kg 110 kg 107 kg    Examination: Calm, NAD, in HD Cta no w/r Reg s1/s2 no gallop Soft benign +bs +edema Grossly intact Mood and affect appropriate in current setting    Data Reviewed: I have personally reviewed following labs and imaging studies  CBC: Recent Labs  Lab 01/16/22 0916 01/20/22 0434  WBC 11.1* 11.9*  HGB 7.0* 7.8*   HCT 22.2* 25.1*  MCV 92.1 93.7  PLT 253 279   Basic Metabolic Panel: Recent Labs  Lab 01/16/22 0916 01/20/22 0434  NA 133* 136  K 3.5 3.9  CL 96* 96*  CO2 25 28  GLUCOSE 266* 195*  BUN 74* 53*  CREATININE 4.89* 3.31*  CALCIUM 9.4 9.7  PHOS 3.9 3.4   GFR: Estimated Creatinine Clearance: 20.1 mL/min (A) (by C-G formula based on SCr of 3.31 mg/dL (H)). Liver Function Tests: Recent Labs  Lab 01/16/22 0916 01/20/22 0434 01/22/22 0429  AST  --   --  66*  ALT  --   --  58*  ALKPHOS  --   --  174*  BILITOT  --   --  0.7  PROT  --   --  6.5  ALBUMIN 2.3* 2.3* 2.4*   No results for input(s): "LIPASE", "AMYLASE" in the last 168 hours. No results for input(s): "AMMONIA" in the last 168 hours. Coagulation Profile: No results for input(s): "INR", "PROTIME" in the last 168 hours. Cardiac Enzymes: No results for input(s): "CKTOTAL", "CKMB", "CKMBINDEX", "TROPONINI" in the last 168 hours. BNP (last 3 results) No results for input(s): "PROBNP" in the last 8760 hours. HbA1C:  No results for input(s): "HGBA1C" in the last 72 hours. CBG: Recent Labs  Lab 01/22/22 0749 01/22/22 1301 01/22/22 1709 01/22/22 2034 01/23/22 0736  GLUCAP 132* 212* 214* 147* 149*   Lipid Profile: No results for input(s): "CHOL", "HDL", "LDLCALC", "TRIG", "CHOLHDL", "LDLDIRECT" in the last 72 hours. Thyroid Function Tests: No results for input(s): "TSH", "T4TOTAL", "FREET4", "T3FREE", "THYROIDAB" in the last 72 hours. Anemia Panel: No results for input(s): "VITAMINB12", "FOLATE", "FERRITIN", "TIBC", "IRON", "RETICCTPCT" in the last 72 hours. Sepsis Labs: No results for input(s): "PROCALCITON", "LATICACIDVEN" in the last 168 hours.  No results found for this or any previous visit (from the past 240 hour(s)).        Radiology Studies: No results found.      Scheduled Meds:  (feeding supplement) PROSource Plus  30 mL Oral TID BM   vitamin C  500 mg Oral BID   Chlorhexidine Gluconate  Cloth  6 each Topical Q0600   epoetin (EPOGEN/PROCRIT) injection  10,000 Units Intravenous Q M,W,F-HD   fluticasone  2 spray Each Nare Daily   furosemide  40 mg Oral Daily   gabapentin  100 mg Oral TID   insulin aspart  0-5 Units Subcutaneous QHS   insulin aspart  0-9 Units Subcutaneous TID WC   insulin aspart  5 Units Subcutaneous TID WC   insulin glargine-yfgn  8 Units Subcutaneous BID   leptospermum manuka honey  1 application  Topical Daily   lidocaine-EPINEPHrine  20 mL Intradermal Once   loratadine  10 mg Oral Daily   metoprolol succinate  50 mg Oral BID   multivitamin  1 tablet Oral QHS   nutrition supplement (JUVEN)  1 packet Oral BID BM   Continuous Infusions:  sodium chloride Stopped (01/19/22 1845)    Assessment & Plan:   Principal Problem:   Severe sepsis (HCC) Active Problems:   Sacral wound, initial encounter   Swelling of left lower extremity   Chronic kidney disease, stage 5 (HCC)   AKI (acute kidney injury) (HCC)   Hypomagnesemia   Premature atrial complexes   Symptomatic anemia   Abdominal pain   Hyponatremia   Essential hypertension   Type 2 diabetes mellitus with chronic kidney disease, with long-term current use of insulin (HCC)   DNR (do not resuscitate)/DNI(Do Not Intubate)   OSA (obstructive sleep apnea)   Pressure injury of skin   Elevated LFTs   Anasarca associated with disorder of kidney   Calciphylaxis   Severe sepsis (HCC) POA as evidenced by tachycardia, leukocytosis, AKI and transaminitis consistent with organ dysfunction and severe sepsis with suspected infection source being sacral wounds versus an abdominal source (CT abdomen pelvis negative for anything acute however).  MRSA PCR negative. Wound cultures grew few Staph aureus, rare Enterococcus faecalis. Initially treated with vancomycin/cefepime. Vanc stopped 6/4.  --Transition to p.o. Keflex to complete course, and then transition to meropenem as she became febrile again. -Repeat  wound cultures with MSSA and staph epi which was resistant to methicillin. 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 -Skin biopsy was done on 6/14 by general surgery, preliminary results are negative for calciphylaxis.  Vasculitis can be a possibility, very nonspecific elevation of lupus anticoagulant, patient was on heparin.  Holding heparin for couple of days and then we will repeat blood tomorrow -Sodium thiosulfate was given during dialysis on 6/14and 6/19 for concern of calciphylaxis -Meropenem was discontinued today and we will observe without antibiotics now. continue wound care 6/23 f/u skin bx  results from [facility] pending Palliative following 6/22 follow-up skin biopsy results from [facility]  Palliative following please see note Lupus testing pending. From 6/20 Is less       Swelling of left lower extremity Improving Initially with redness, patient attributes it to clindamycin.  She reported on admission that this has been present for over one month.   Left lower extremity Doppler US on 12/18/21 was negative for DVT.  6/23 keep legs elevated     Sacral wound, initial encounter See above. 6/23 General surgery following.  Dry eschar in the edges.  No purulence or wet gangrene.  There is no absolute indication for further debridement.  6/22 continue wound vac /vac care Wound nsg following Dressing changed with WOC RN today Continue to pressure offload, frequent repositioning, low-air-loss mattress Continue Decadron gauze and ABD pads to the buttocks.  WOC RN signed off    AKI on Chronic kidney disease, stage 5 (HCC) Now transition to ESRD and patient was started on dialysis. 6/23 in dialysis today  Nephrology following  Has dialysis outpatients spot however needs to be able to sit in chair for 2 hours so far this has not happened       Premature atrial complexes Patient became significantly tachycardic on 6/2 with heart rates sustaining in the 130s.   Cardiology reviewed telemetry and feels patient dysrhythmias were not A-fib but sinus tach with PACs. --Cardiology following -- Continue Lopressor 25 mg BID     Hypomagnesemia Resolved with replacement on 6/3. Monitor and replace Mg as needed.     Symptomatic anemia Hemoglobin on admission was 6.9 >> 6.3, down from 7.6 at time of discharge on 5/23.  No signs of active bleeding, did have vaginal/endometrial bleeding previous admission but no reports of this recently.  May have slow chronic blood loss from sacral wounds.  Has baseline anemia of chronic disease due to renal failure. Received 2 unit PRBC transfusion . -Continue to monitor -Transfuse if below 7   Abdominal pain Patient reported on admission.  CT of abdomen pelvis showed abnormal endometrial thickening for a postmenopausal patient, bladder distention estimated 520 mL, no other acute or inflammatory processes or acute findings. --Monitor clinically for now -- Needs OB/GYN follow-up for endometrial sampling to exclude endometrial carcinoma   Hyponatremia Resolved.  Presented with sodium 129.  This was noted on labs at [facility] and was the reason patient presented to the ED. improved with IV hydration. -Continue to monitor   Essential hypertension Blood pressure mildly elevated. -Continue with Lasix, metoprolol and hydralazine  Rash Intermittently shows up in body, face, extremities. D/w ID too who noticed it today. Wonder if allergic rxn to morphine. Will hold morphine and see if improvement Off abx for few day.   Type 2 diabetes mellitus with chronic kidney disease, with long-term current use of insulin (HCC) CBG remained elevated, at 321 this morning -Increase Semglee to 8 units twice daily -Increase mealtime coverage to 5 units -Continue with SSI   DNR (do not resuscitate)/DNI(Do Not Intubate) Admitting hospitalist confirmed DNR CODE STATUS with patient during admission encounter   Calciphylaxis Concern of  calciphylaxis due to nonhealing and worsening wound with necrosis. -Skin biopsy obtained, preliminary results were negative for calciphylaxis derm pathology from [facility] pending      Anasarca associated with disorder of kidney Nephrology following. Treated with Lasix IV, later Lasix drip, IV albumin.  Nephrology switched back to PO Lasix today.  Monitor I/O's & Daily Wts   Elevated LFTs On admission,  alk phos 212, AST 182, ALT 107.  Unclear etiology, repeat LFTs not available today.  Appears LFTs were elevated at time of discharge on 5/23 but have increased since. RUQ ultrasound unremarkable. Acute hepatitis panel negative. -- Follow CMP periodically   Pressure injury of skin Present on admission.  Patient has multiple sacral pressure injuries.  Some concern at time of admission that these were the source of sepsis. General surgery consulted and took patient to the OR on 6/1 for debridement.   Now with wound VAC. --Initial wound VAC change Monday 6/5 then every M/W/F -- Wound care consulted -- Frequent repositioning --Follow general surgery recommendations --Pain control --Follow blood cultures Pressure Injury 01/01/22 Buttocks Left Stage 3 -  Full thickness tissue loss. Subcutaneous fat may be visible but bone, tendon or muscle are NOT exposed. (Active)  01/01/22 0222  Location: Buttocks  Location Orientation: Left  Staging: Stage 3 -  Full thickness tissue loss. Subcutaneous fat may be visible but bone, tendon or muscle are NOT exposed.  Wound Description (Comments):   Present on Admission: Yes     Pressure Injury 01/01/22 Buttocks Right Stage 2 -  Partial thickness loss of dermis presenting as a shallow open injury with a red, pink wound bed without slough. (Active)  01/01/22 0223  Location: Buttocks  Location Orientation: Right  Staging: Stage 2 -  Partial thickness loss of dermis presenting as a shallow open injury with a red, pink wound bed without slough.  Wound Description  (Comments):   Present on Admission: Yes     Pressure Injury 01/01/22 Hip Left Stage 2 -  Partial thickness loss of dermis presenting as a shallow open injury with a red, pink wound bed without slough. (Active)  01/01/22 0225  Location: Hip  Location Orientation: Left  Staging: Stage 2 -  Partial thickness loss of dermis presenting as a shallow open injury with a red, pink wound bed without slough.  Wound Description (Comments):   Present on Admission: Yes     Pressure Injury 01/01/22 Hip Right Stage 3 -  Full thickness tissue loss. Subcutaneous fat may be visible but bone, tendon or muscle are NOT exposed. (Active)  01/01/22 0225  Location: Hip  Location Orientation: Right  Staging: Stage 3 -  Full thickness tissue loss. Subcutaneous fat may be visible but bone, tendon or muscle are NOT exposed.  Wound Description (Comments):   Present on Admission: Yes          OSA (obstructive sleep apnea) .  Appears not to be on CPAP     DVT prophylaxis: scd Code Status:dnr Family Communication: none at bedside Disposition Plan: SNF Status is: Inpatient Remains inpatient appropriate because: w/u pending      LOS: 22 days   Time spent: 35 min    Lynn Ito, MD [facility] Pager [phone]  If 7PM-7AM, please contact night-coverage 01/23/2022, 8:08 AM

## 2022-01-24 DIAGNOSIS — A419 Sepsis, unspecified organism: Secondary | ICD-10-CM | POA: Diagnosis not present

## 2022-01-24 DIAGNOSIS — R6520 Severe sepsis without septic shock: Secondary | ICD-10-CM | POA: Diagnosis not present

## 2022-01-24 LAB — HEMOGLOBIN AND HEMATOCRIT, BLOOD
HCT: 22.5 % — ABNORMAL LOW (ref 36.0–46.0)
Hemoglobin: 7.1 g/dL — ABNORMAL LOW (ref 12.0–15.0)

## 2022-01-24 LAB — CBC
HCT: 22 % — ABNORMAL LOW (ref 36.0–46.0)
Hemoglobin: 6.9 g/dL — ABNORMAL LOW (ref 12.0–15.0)
MCH: 28.5 pg (ref 26.0–34.0)
MCHC: 31.4 g/dL (ref 30.0–36.0)
MCV: 90.9 fL (ref 80.0–100.0)
Platelets: 256 10*3/uL (ref 150–400)
RBC: 2.42 MIL/uL — ABNORMAL LOW (ref 3.87–5.11)
RDW: 15.3 % (ref 11.5–15.5)
WBC: 14.3 10*3/uL — ABNORMAL HIGH (ref 4.0–10.5)
nRBC: 0 % (ref 0.0–0.2)

## 2022-01-24 LAB — LUPUS ANTICOAGULANT PANEL
DRVVT: 57.2 s — ABNORMAL HIGH (ref 0.0–47.0)
PTT Lupus Anticoagulant: 55.2 s — ABNORMAL HIGH (ref 0.0–43.5)

## 2022-01-24 LAB — PREPARE RBC (CROSSMATCH)

## 2022-01-24 LAB — GLUCOSE, CAPILLARY
Glucose-Capillary: 163 mg/dL — ABNORMAL HIGH (ref 70–99)
Glucose-Capillary: 192 mg/dL — ABNORMAL HIGH (ref 70–99)
Glucose-Capillary: 213 mg/dL — ABNORMAL HIGH (ref 70–99)
Glucose-Capillary: 221 mg/dL — ABNORMAL HIGH (ref 70–99)

## 2022-01-24 LAB — HEXAGONAL PHASE PHOSPHOLIPID: Hexagonal Phase Phospholipid: 5 s (ref 0–11)

## 2022-01-24 LAB — DRVVT CONFIRM: dRVVT Confirm: 1.1 ratio (ref 0.8–1.2)

## 2022-01-24 LAB — DRVVT MIX: dRVVT Mix: 49.8 s — ABNORMAL HIGH (ref 0.0–40.4)

## 2022-01-24 LAB — PTT-LA MIX: PTT-LA Mix: 53.4 s — ABNORMAL HIGH (ref 0.0–40.5)

## 2022-01-24 MED ORDER — SODIUM CHLORIDE 0.9 % IV SOLN
500.0000 mg | INTRAVENOUS | Status: DC
  Administered 2022-01-24 – 2022-01-27 (×4): 500 mg via INTRAVENOUS
  Filled 2022-01-24 (×3): qty 500
  Filled 2022-01-24 (×2): qty 10

## 2022-01-24 MED ORDER — METOPROLOL SUCCINATE ER 25 MG PO TB24
25.0000 mg | ORAL_TABLET | Freq: Two times a day (BID) | ORAL | Status: DC
  Administered 2022-01-24 – 2022-02-10 (×34): 25 mg via ORAL
  Filled 2022-01-24 (×35): qty 1

## 2022-01-24 MED ORDER — SODIUM CHLORIDE 0.9% IV SOLUTION: Freq: Once | INTRAVENOUS | Status: AC

## 2022-01-24 MED ORDER — ACETAMINOPHEN 325 MG PO TABS
650.0000 mg | ORAL_TABLET | Freq: Once | ORAL | Status: AC
  Administered 2022-01-24: 650 mg via ORAL
  Filled 2022-01-24: qty 2

## 2022-01-24 MED ORDER — CLOTRIMAZOLE 1 % VA CREA
1.0000 | TOPICAL_CREAM | Freq: Every day | VAGINAL | Status: AC
  Administered 2022-01-24 – 2022-01-30 (×6): 1 via VAGINAL
  Filled 2022-01-24: qty 45

## 2022-01-24 NOTE — Progress Notes (Signed)
PROGRESS NOTE    Theresa Warren  NFA:[id number] DOB: [birth year] DOA: 12/31/2021 PCP: Rodrigo Ran, MD    Brief Narrative:  Brief hospital course: [name] is a 66 year old female hyperlipidemia, hypertension, CKD stage V, insulin-dependent diabetes mellitus, who presents emergency department from [facility] for evaluation of low sodium noted on labs at the facility.  She was discharged to rehab on 5/23 after being admitted following a fall and also with acute on chronic anemia.   ED Course - temp 99.6, HR 107, otherwise unremarkable vitals. Labs with sodium 129, potassium 5.1, chloride 99, bicarb 17, BUN of 100, serum creatinine of 4.61, GFR of 10, WBC 18.4, hemoglobin 6.9, platelets of 473.  LFT's were also elevated.   Patient met criteria for sepsis on arrival, treated per protocol in the ED with IV Cefepime, Flagyl, Vanc, and LR 1 L bolus fluids.  Infection source unclear but felt possibly due to sacral wound infection and concern for underlying osteomyelitis.   Admitted to the hospitalist service for further evaluation and management.  Continued on IV Cefepime and Vanc.   General surgery was consulted for debridement on sacral pressure injury.  Nephrology consulted due to worsening renal function.    Weeks summary from prior provider. Patient was lethargic and displayed uremic symptoms.  She was started on hemodialysis after tunneled dialysis catheter was placed by vascular surgery on 6/9.  First dialysis was started on 6/9.  In the evening she was febrile and has been spiking fevers for the past few days.  She was placed on IV meropenem broad-spectrum and ID was consulted.  Also she has left thigh induration wounds soft tissue ultrasound was negative and she was also negative for bilateral DVTs.   6/14: 2 separate wound cultures, 1 with MSSA and Enterococcus faecalis and second with MSSA and Staph epidermidis resistant to methicillin.  Antibiotics were switched to  meropenem.  ID is on board.  Patient had multiple wound debridements. 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 Autoimmune and vasculitis labs pending.  CK low at 10, acute hepatitis panel negative. Patient also has postmenopausal vaginal bleeding resulted in blood loss anemia, endometrial thickening on scan which will required outpatient gynecologic evaluation to rule out malignancy.  So far received 2 unit of PRBC.   6/15: Her wound continued to get worse.  Skin biopsy for concern of calciphylaxis was done by general surgery on 01/14/2022.  New pictures in ID note. ANA comprehensive profile was negative.  Rest of the autoimmune labs are pending.   6/16: ANCA profile negative.  Antiphospholipid antibody and skin biopsy is still pending. CBG elevated, increased Semglee to twice daily and added mealtime coverage. Preliminary skin biopsy reports with no calciphylaxis, some microthrombosis, slight sent to [facility] for dermatology department review. Multiple deep wounds and necrosis.  See today's surgery progress note for new pictures.   6/17: Patient antiphospholipid syndrome profile with negative anticardiolipin IgG and IgM but positive PTT lupus anticoagulant and DRVVT, on further characterization DRVVT mix was slightly elevated with normal DRVVT confirm.  Final skin biopsy results still pending.   6/18: CBG remained elevated, ongoing adjustment to her insulin.  Had a curbside consult with different rheumatologist and according to her recommendations she was suggesting stopping heparin for couple of days and repeat PTT lupus anticoagulant.  Patient has very nonspecific testing and does not make the cutoff for antiphospholipid syndrome.   6/19: Per nursing staff patient refusing daily dressing change and keep delaying which  interferes with their workflow.  Holding heparin and we can repeat PTT lupus anticoagulant tomorrow before restarting heparin for DVT prophylaxis.  Final skin  biopsy results are still pending Continues to have significant pain-palliative care was consulted for pain management. Pulm nephrology if she cannot sit in a dialysis chair, they might not be able to offer dialysis for a long time.   6/20: Palliative care was consulted yesterday for pain management, apparently patient was not using her as needed meds appropriately and they suggested giving a few doses daily.  Lupus anticoagulant was repeated as patient was off the heparin for 2 days.  Received second dose of sodium thiosulfate during dialysis yesterday.  Skin biopsy still pending. ID stopped antibiotics and observing now without them.   6/21 Had HD earlier. Feels tired after HD now.  6/22 was given pain meds due to pain 30 min earlier, now tells me she is sleepy. No other complaints. No sob or cp. Tmax 100.7 6/23 seen in HD today. C/o pain around buttom and hips, nsg made aware.Noticed reddish rash like in her face today, some on her hands. Seems appears periodically ,. Wonder if medication induced.  6/24 c/o vaginal burning, irriration. Tmax 101.discussed with patient about the face and body rash. Told her I am holding morphine for now to see if improves. However she stated she has been having this problem since took clindamycin in the past for tooth infection.  Consultants:  Nephrology, palliative, ID  Procedures:   Antimicrobials:     Subjective: Pain controlled    Objective: Vitals:   01/23/22 2137 01/24/22 0500 01/24/22 0536 01/24/22 0814  BP: 129/63  123/66 102/67  Pulse: 91  87 80  Resp: 20  18 18   Temp: (!) 100.8 F (38.2 C)  99.1 F (37.3 C) 99.5 F (37.5 C)  TempSrc: Oral     SpO2: 97%  95% 96%  Weight:  110 kg    Height:        Intake/Output Summary (Last 24 hours) at 01/24/2022 0828 Last data filed at 01/23/2022 2159 Gross per 24 hour  Intake 180 ml  Output 1000 ml  Net -820 ml    Filed Weights   01/23/22 0918 01/23/22 1225 01/24/22 0500  Weight: 105 kg  104 kg 110 kg    Examination: Calm, NAD Cta no w/r Reg s1/s2 no gallop Soft benign +bs +edema Vagina area moisture around area.some discharge , difficult to see whitish plaques [place]aka grossly intact Mood and affect appropriate in current setting    Data Reviewed: I have personally reviewed following labs and imaging studies  CBC: Recent Labs  Lab 01/20/22 0434 01/23/22 0811  WBC 11.9* 15.1*  HGB 7.8* 7.4*  HCT 25.1* 23.5*  MCV 93.7 91.4  PLT 279 240   Basic Metabolic Panel: Recent Labs  Lab 01/20/22 0434 01/23/22 0811  NA 136 132*  K 3.9 4.2  CL 96* 94*  CO2 28 25  GLUCOSE 195* 151*  BUN 53* 61*  CREATININE 3.31* 4.55*  CALCIUM 9.7 9.2  PHOS 3.4  --    GFR: Estimated Creatinine Clearance: 14.9 mL/min (A) (by C-G formula based on SCr of 4.55 mg/dL (H)). Liver Function Tests: Recent Labs  Lab 01/20/22 0434 01/22/22 0429  AST  --  66*  ALT  --  58*  ALKPHOS  --  174*  BILITOT  --  0.7  PROT  --  6.5  ALBUMIN 2.3* 2.4*   No results for input(s): "LIPASE", "AMYLASE" in  the last 168 hours. No results for input(s): "AMMONIA" in the last 168 hours. Coagulation Profile: No results for input(s): "INR", "PROTIME" in the last 168 hours. Cardiac Enzymes: No results for input(s): "CKTOTAL", "CKMB", "CKMBINDEX", "TROPONINI" in the last 168 hours. BNP (last 3 results) No results for input(s): "PROBNP" in the last 8760 hours. HbA1C: No results for input(s): "HGBA1C" in the last 72 hours. CBG: Recent Labs  Lab 01/22/22 2034 01/23/22 0736 01/23/22 1306 01/23/22 1616 01/23/22 2139  GLUCAP 147* 149* 95 167* 241*   Lipid Profile: No results for input(s): "CHOL", "HDL", "LDLCALC", "TRIG", "CHOLHDL", "LDLDIRECT" in the last 72 hours. Thyroid Function Tests: No results for input(s): "TSH", "T4TOTAL", "FREET4", "T3FREE", "THYROIDAB" in the last 72 hours. Anemia Panel: No results for input(s): "VITAMINB12", "FOLATE", "FERRITIN", "TIBC", "IRON", "RETICCTPCT" in the  last 72 hours. Sepsis Labs: No results for input(s): "PROCALCITON", "LATICACIDVEN" in the last 168 hours.  No results found for this or any previous visit (from the past 240 hour(s)).        Radiology Studies: No results found.      Scheduled Meds:  (feeding supplement) PROSource Plus  30 mL Oral TID BM   vitamin C  500 mg Oral BID   Chlorhexidine Gluconate Cloth  6 each Topical Q0600   epoetin (EPOGEN/PROCRIT) injection  10,000 Units Intravenous Q M,W,F-HD   fluticasone  2 spray Each Nare Daily   furosemide  40 mg Oral Daily   gabapentin  100 mg Oral TID   insulin aspart  0-5 Units Subcutaneous QHS   insulin aspart  0-9 Units Subcutaneous TID WC   insulin aspart  5 Units Subcutaneous TID WC   insulin glargine-yfgn  8 Units Subcutaneous BID   leptospermum manuka honey  1 application  Topical Daily   lidocaine-EPINEPHrine  20 mL Intradermal Once   loratadine  10 mg Oral Daily   metoprolol succinate  50 mg Oral BID   multivitamin  1 tablet Oral QHS   nutrition supplement (JUVEN)  1 packet Oral BID BM   Continuous Infusions:  sodium chloride Stopped (01/19/22 1845)    Assessment & Plan:   Principal Problem:   Severe sepsis (HCC) Active Problems:   Sacral wound, initial encounter   Swelling of left lower extremity   Chronic kidney disease, stage 5 (HCC)   AKI (acute kidney injury) (HCC)   Hypomagnesemia   Premature atrial complexes   Symptomatic anemia   Abdominal pain   Hyponatremia   Essential hypertension   Type 2 diabetes mellitus with chronic kidney disease, with long-term current use of insulin (HCC)   DNR (do not resuscitate)/DNI(Do Not Intubate)   OSA (obstructive sleep apnea)   Pressure injury of skin   Elevated LFTs   Anasarca associated with disorder of kidney   Calciphylaxis   Severe sepsis (HCC) POA as evidenced by tachycardia, leukocytosis, AKI and transaminitis consistent with organ dysfunction and severe sepsis with suspected infection  source being sacral wounds versus an abdominal source (CT abdomen pelvis negative for anything acute however).  MRSA PCR negative. Wound cultures grew few Staph aureus, rare Enterococcus faecalis. Initially treated with vancomycin/cefepime. Vanc stopped 6/4.  --Transition to p.o. Keflex to complete course, and then transition to meropenem as she became febrile again. -Repeat wound cultures with MSSA and staph epi which was resistant to methicillin. 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 -Skin biopsy was done on 6/14 by general surgery, preliminary results are negative for calciphylaxis.  Vasculitis can be  a possibility, very nonspecific elevation of lupus anticoagulant, patient was on heparin.  Holding heparin for couple of days and then we will repeat blood tomorrow -Sodium thiosulfate was given during dialysis on 6/14and 6/19 for concern of calciphylaxis -Meropenem was discontinued  and we will observe without antibiotics now. continue wound care Palliative following please see note Lupus testing pending. From 6/20 Is less 6/24 tmax 101, cbc pending, if wbc up may consider resuming meropenem Ck bcx        Swelling of left lower extremity Improving Initially with redness, patient attributes it to clindamycin.  She reported on admission that this has been present for over one month.   Left lower extremity Doppler US on 12/18/21 was negative for DVT.  6/24 keep legs elevated.   Vaginal Yeast Keep area dry-d/w nsg Start vaginal suppository  Hold on diflucan as LFT were elevated .   Sacral wound, initial encounter See above. 6/23 General surgery following.  Dry eschar in the edges.  No purulence or wet gangrene.  There is no absolute indication for further debridement.  6/22 continue wound vac /vac care Wound nsg following Dressing changed with WOC RN today Continue to pressure offload, frequent repositioning, low-air-loss mattress Continue Decadron  gauze and ABD pads to the buttocks.  WOC RN signed off    AKI on Chronic kidney disease, stage 5 (HCC) Now transition to ESRD and patient was started on dialysis. 6/23 in dialysis today  Nephrology following  Has dialysis outpatients spot however needs to be able to sit in chair for 2 hours so far this has not happened       Premature atrial complexes Patient became significantly tachycardic on 6/2 with heart rates sustaining in the 130s.  Cardiology reviewed telemetry and feels patient dysrhythmias were not A-fib but sinus tach with PACs. --Cardiology following -- Continue Lopressor 25 mg BID     Hypomagnesemia Resolved with replacement on 6/3. Monitor and replace Mg as needed.     Symptomatic anemia Hemoglobin on admission was 6.9 >> 6.3, down from 7.6 at time of discharge on 5/23.  No signs of active bleeding, did have vaginal/endometrial bleeding previous admission but no reports of this recently.  May have slow chronic blood loss from sacral wounds.  Has baseline anemia of chronic disease due to renal failure. Received 2 unit PRBC transfusion . -Continue to monitor -Transfuse if below 7   Abdominal pain Patient reported on admission.  CT of abdomen pelvis showed abnormal endometrial thickening for a postmenopausal patient, bladder distention estimated 520 mL, no other acute or inflammatory processes or acute findings. --Monitor clinically for now -- Needs OB/GYN follow-up for endometrial sampling to exclude endometrial carcinoma   Hyponatremia Resolved.  Presented with sodium 129.  This was noted on labs at [facility] and was the reason patient presented to the ED. improved with IV hydration. -Continue to monitor   Essential hypertension Blood pressure mildly elevated. -Continue with Lasix, metoprolol and hydralazine  Rash Intermittently shows up in body, face, extremities. D/w ID too who noticed it today. Wonder if allergic rxn to morphine. Will hold morphine and see if  improvement Off abx for few day.   Type 2 diabetes mellitus with chronic kidney disease, with long-term current use of insulin (HCC) CBG remained elevated, at 321 this morning -Increase Semglee to 8 units twice daily -Increase mealtime coverage to 5 units -Continue with SSI   DNR (do not resuscitate)/DNI(Do Not Intubate) Admitting hospitalist confirmed DNR CODE STATUS with patient during  admission encounter   Calciphylaxis Concern of calciphylaxis due to nonhealing and worsening wound with necrosis. -Skin biopsy obtained, preliminary results were negative for calciphylaxis derm pathology from [facility] pending      Anasarca associated with disorder of kidney Nephrology following. Treated with Lasix IV, later Lasix drip, IV albumin.  Nephrology switched back to PO Lasix today.  Monitor I/O's & Daily Wts   Elevated LFTs On admission, alk phos 212, AST 182, ALT 107.  Unclear etiology, repeat LFTs not available today.  Appears LFTs were elevated at time of discharge on 5/23 but have increased since. RUQ ultrasound unremarkable. Acute hepatitis panel negative. -- Follow CMP periodically   Pressure injury of skin Present on admission.  Patient has multiple sacral pressure injuries.  Some concern at time of admission that these were the source of sepsis. General surgery consulted and took patient to the OR on 6/1 for debridement.   Now with wound VAC. --Initial wound VAC change Monday 6/5 then every M/W/F -- Wound care consulted -- Frequent repositioning --Follow general surgery recommendations --Pain control --Follow blood cultures Pressure Injury 01/01/22 Buttocks Left Stage 3 -  Full thickness tissue loss. Subcutaneous fat may be visible but bone, tendon or muscle are NOT exposed. (Active)  01/01/22 0222  Location: Buttocks  Location Orientation: Left  Staging: Stage 3 -  Full thickness tissue loss. Subcutaneous fat may be visible but bone, tendon or muscle are NOT exposed.  Wound  Description (Comments):   Present on Admission: Yes     Pressure Injury 01/01/22 Buttocks Right Stage 2 -  Partial thickness loss of dermis presenting as a shallow open injury with a red, pink wound bed without slough. (Active)  01/01/22 0223  Location: Buttocks  Location Orientation: Right  Staging: Stage 2 -  Partial thickness loss of dermis presenting as a shallow open injury with a red, pink wound bed without slough.  Wound Description (Comments):   Present on Admission: Yes     Pressure Injury 01/01/22 Hip Left Stage 2 -  Partial thickness loss of dermis presenting as a shallow open injury with a red, pink wound bed without slough. (Active)  01/01/22 0225  Location: Hip  Location Orientation: Left  Staging: Stage 2 -  Partial thickness loss of dermis presenting as a shallow open injury with a red, pink wound bed without slough.  Wound Description (Comments):   Present on Admission: Yes     Pressure Injury 01/01/22 Hip Right Stage 3 -  Full thickness tissue loss. Subcutaneous fat may be visible but bone, tendon or muscle are NOT exposed. (Active)  01/01/22 0225  Location: Hip  Location Orientation: Right  Staging: Stage 3 -  Full thickness tissue loss. Subcutaneous fat may be visible but bone, tendon or muscle are NOT exposed.  Wound Description (Comments):   Present on Admission: Yes          OSA (obstructive sleep apnea) .  Appears not to be on CPAP     DVT prophylaxis: scd Code Status:dnr Family Communication: none at bedside Disposition Plan: SNF Status is: Inpatient Remains inpatient appropriate because: w/u pending      LOS: 23 days   Time spent: 35 min    Lynn Ito, MD [facility] Pager [phone]  If 7PM-7AM, please contact night-coverage 01/24/2022, 8:28 AM

## 2022-01-24 NOTE — Progress Notes (Signed)
RN attempted dressing changes multiple times today with patient. Patient refusing dressings to be changed and states she "wants to wait until its dark out". RN explained importance of dressing change but still refusing.

## 2022-01-24 NOTE — Progress Notes (Signed)
[facility]  PROGRESS NOTE   Subjective:   Patient was seen today on first floor Patient offers no new specific physical complaint. I then discussed with the patient to please allow the nurses to do the wound care as on review of the data/Discussion with the floor team I had noticed that patient refused sacral dressing changes.  Patient was understanding   Objective:  Vital signs: Blood pressure 102/67, pulse 80, temperature 99.5 F (37.5 C), resp. rate 18, height 5\' 3"  (1.6 m), weight 110 kg, SpO2 96 %.  Intake/Output Summary (Last 24 hours) at 01/24/2022 0831 Last data filed at 01/23/2022 2159 Gross per 24 hour  Intake 180 ml  Output 1000 ml  Net -820 ml   Filed Weights   01/23/22 0918 01/23/22 1225 01/24/22 0500  Weight: 105 kg 104 kg 110 kg     Physical Exam: General:  No acute distress  Head:  Normocephalic, atraumatic. Moist oral mucosal membranes  Eyes:  Anicteric  Lungs:   Clear to auscultation, normal effort  Heart:  S1S2 no rubs  Abdomen:   Soft, nontender, bowel sounds present  Extremities:  + peripheral edema.  Neurologic:  Awake, alert, following commands  Skin:  No lesions, sacral wound with NPWV  Access: RT Permcath    Basic Metabolic Panel: Recent Labs  Lab 01/20/22 0434 01/23/22 0811  NA 136 132*  K 3.9 4.2  CL 96* 94*  CO2 28 25  GLUCOSE 195* 151*  BUN 53* 61*  CREATININE 3.31* 4.55*  CALCIUM 9.7 9.2  PHOS 3.4  --     CBC: Recent Labs  Lab 01/20/22 0434 01/23/22 0811  WBC 11.9* 15.1*  HGB 7.8* 7.4*  HCT 25.1* 23.5*  MCV 93.7 91.4  PLT 279 240     Urinalysis: No results for input(s): "COLORURINE", "LABSPEC", "PHURINE", "GLUCOSEU", "HGBUR", "BILIRUBINUR", "KETONESUR", "PROTEINUR", "UROBILINOGEN", "NITRITE", "LEUKOCYTESUR" in the last 72 hours.  Invalid input(s): "APPERANCEUR"    Imaging: No results found.   Medications:    sodium chloride Stopped (01/19/22 1845)    (feeding supplement) PROSource Plus  30  mL Oral TID BM   vitamin C  500 mg Oral BID   Chlorhexidine Gluconate Cloth  6 each Topical Q0600   epoetin (EPOGEN/PROCRIT) injection  10,000 Units Intravenous Q M,W,F-HD   fluticasone  2 spray Each Nare Daily   furosemide  40 mg Oral Daily   gabapentin  100 mg Oral TID   insulin aspart  0-5 Units Subcutaneous QHS   insulin aspart  0-9 Units Subcutaneous TID WC   insulin aspart  5 Units Subcutaneous TID WC   insulin glargine-yfgn  8 Units Subcutaneous BID   leptospermum manuka honey  1 application  Topical Daily   lidocaine-EPINEPHrine  20 mL Intradermal Once   loratadine  10 mg Oral Daily   metoprolol succinate  50 mg Oral BID   multivitamin  1 tablet Oral QHS   nutrition supplement (JUVEN)  1 packet Oral BID BM    Assessment/ Plan:     Principal Problem:   Severe sepsis (HCC) Active Problems:   OSA (obstructive sleep apnea)   Chronic kidney disease, stage 5 (HCC)   Essential hypertension   DNR (do not resuscitate)/DNI(Do Not Intubate)   Type 2 diabetes mellitus with chronic kidney disease, with long-term current use of insulin (HCC)   Hypomagnesemia   Pressure injury of skin   Symptomatic anemia   Elevated LFTs   Sacral wound, initial encounter   Swelling of  left lower extremity   Abdominal pain   AKI (acute kidney injury) (HCC)   Hyponatremia   Premature atrial complexes   Anasarca associated with disorder of kidney   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  Patient is reached end-stage renal disease and was started on renal replacement therapy.   2D echo from January 03, 2022-:LVEF 60 to 65%, no regional wall motion abnormalities, asymmetric left ventricular hypertrophy of the basal septal segment.  Diastolic parameters normal.   #: End-stage renal disease: Patient has a permacath   Patient did receive her dialysis  yesterday            No need for renal placement therapy today             We will plan for next treatment  for Monday.  Patient currently working with PT/OT and has been able to sit in a chair for short periods of time, longest time of 2 hours.  We will continue to monitor this progress.  Patient has outpatient chair  at [facility] clinic that will be held until 01/27/2022.  If patient has not been discharged at that time, chair will be released and we will resubmit request.      #: Sepsis/fever likely from sacral wound. Biopsy negative for calciphylaxis. Prescribed oral Keflex.  Negative pressure wound VAC remains in place and managed by surgery team.  ID following, Derm consulted   #: Anemia secondary to chronic kidney disease complicated by sepsis. Has received blood transfusions during this admission.  -Hemoglobin 7.4, below target.  Remains on 10,000 units IV EPO with dialysis treatments.   #: Secondary hyperparathyroidism:  Calcium remains within acceptable range.  # Diabetes with CKD Lab Results  Component Value Date   HGBA1C 10.0 (H) 12/18/2021   Poorly controlled Diabetes Glucose control improved.  Primary team to manage SSI    LOS: 23 Cambell Stanek s [facility] 6/24/20238:31 AM

## 2022-01-25 DIAGNOSIS — R6520 Severe sepsis without septic shock: Secondary | ICD-10-CM | POA: Diagnosis not present

## 2022-01-25 DIAGNOSIS — A419 Sepsis, unspecified organism: Secondary | ICD-10-CM | POA: Diagnosis not present

## 2022-01-25 LAB — GLUCOSE, CAPILLARY
Glucose-Capillary: 165 mg/dL — ABNORMAL HIGH (ref 70–99)
Glucose-Capillary: 107 mg/dL — ABNORMAL HIGH (ref 70–99)
Glucose-Capillary: 143 mg/dL — ABNORMAL HIGH (ref 70–99)
Glucose-Capillary: 154 mg/dL — ABNORMAL HIGH (ref 70–99)

## 2022-01-25 LAB — HEMOGLOBIN AND HEMATOCRIT, BLOOD
HCT: 26.8 % — ABNORMAL LOW (ref 36.0–46.0)
Hemoglobin: 8.6 g/dL — ABNORMAL LOW (ref 12.0–15.0)

## 2022-01-25 LAB — PREPARE RBC (CROSSMATCH)

## 2022-01-25 MED ORDER — SODIUM CHLORIDE 0.9% IV SOLUTION
Freq: Once | INTRAVENOUS | Status: AC
Start: 2022-01-25 — End: 2022-01-25

## 2022-01-25 NOTE — Progress Notes (Signed)
Patient has refused all wound care dressing changes.  Nurse has educated the patient.  Patient continues to refuse.  Nurse will pass this information along to the day shift RN.

## 2022-01-25 NOTE — Progress Notes (Signed)
[facility]  PROGRESS NOTE   Subjective:   Patient was seen today on first floor Patient offers no new specific physical complaint.    Objective:  Vital signs: Blood pressure (!) 130/53, pulse 79, temperature 99.9 F (37.7 C), temperature source Oral, resp. rate 16, height 5\' 3"  (1.6 m), weight 110 kg, SpO2 96 %.  Intake/Output Summary (Last 24 hours) at 01/25/2022 1324 Last data filed at 01/25/2022 0600 Gross per 24 hour  Intake 749.17 ml  Output --  Net 749.17 ml   Filed Weights   01/23/22 0918 01/23/22 1225 01/24/22 0500  Weight: 105 kg 104 kg 110 kg     Physical Exam: General:  No acute distress  Head:  Normocephalic, atraumatic. Moist oral mucosal membranes  Eyes:  Anicteric  Lungs:   Clear to auscultation, normal effort  Heart:  S1S2 no rubs  Abdomen:   Soft, nontender, bowel sounds present  Extremities:  + peripheral edema.  Neurologic:  Awake, alert, following commands  Skin:  No lesions, sacral wound with NPWV  Access: RT Permcath    Basic Metabolic Panel: Recent Labs  Lab 01/20/22 0434 01/23/22 0811  NA 136 132*  K 3.9 4.2  CL 96* 94*  CO2 28 25  GLUCOSE 195* 151*  BUN 53* 61*  CREATININE 3.31* 4.55*  CALCIUM 9.7 9.2  PHOS 3.4  --     CBC: Recent Labs  Lab 01/20/22 0434 01/23/22 0811 01/24/22 1501 01/24/22 2324 01/25/22 0712  WBC 11.9* 15.1* 14.3*  --   --   HGB 7.8* 7.4* 6.9* 7.1* 8.6*  HCT 25.1* 23.5* 22.0* 22.5* 26.8*  MCV 93.7 91.4 90.9  --   --   PLT 279 240 256  --   --      Urinalysis: No results for input(s): "COLORURINE", "LABSPEC", "PHURINE", "GLUCOSEU", "HGBUR", "BILIRUBINUR", "KETONESUR", "PROTEINUR", "UROBILINOGEN", "NITRITE", "LEUKOCYTESUR" in the last 72 hours.  Invalid input(s): "APPERANCEUR"    Imaging: No results found.   Medications:    sodium chloride Stopped (01/19/22 1845)   meropenem (MERREM) IV 200 mL/hr at 01/24/22 1751    (feeding supplement) PROSource Plus  30 mL Oral TID BM    vitamin C  500 mg Oral BID   Chlorhexidine Gluconate Cloth  6 each Topical Q0600   clotrimazole  1 Applicatorful Vaginal QHS   epoetin (EPOGEN/PROCRIT) injection  10,000 Units Intravenous Q M,W,F-HD   fluticasone  2 spray Each Nare Daily   gabapentin  100 mg Oral TID   insulin aspart  0-5 Units Subcutaneous QHS   insulin aspart  0-9 Units Subcutaneous TID WC   insulin aspart  5 Units Subcutaneous TID WC   insulin glargine-yfgn  8 Units Subcutaneous BID   leptospermum manuka honey  1 application  Topical Daily   lidocaine-EPINEPHrine  20 mL Intradermal Once   loratadine  10 mg Oral Daily   metoprolol succinate  25 mg Oral BID   multivitamin  1 tablet Oral QHS   nutrition supplement (JUVEN)  1 packet Oral BID BM    Assessment/ Plan:     Principal Problem:   Severe sepsis (HCC) Active Problems:   OSA (obstructive sleep apnea)   Chronic kidney disease, stage 5 (HCC)   Essential hypertension   DNR (do not resuscitate)/DNI(Do Not Intubate)   Type 2 diabetes mellitus with chronic kidney disease, with long-term current use of insulin (HCC)   Hypomagnesemia   Pressure injury of skin   Symptomatic anemia   Elevated LFTs  Sacral wound, initial encounter   Swelling of left lower extremity   Abdominal pain   AKI (acute kidney injury) (HCC)   Hyponatremia   Premature atrial complexes   Anasarca associated with disorder of kidney   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  Patient is reached end-stage renal disease and was started on renal replacement therapy.   2D echo from January 03, 2022-:LVEF 60 to 65%, no regional wall motion abnormalities, asymmetric left ventricular hypertrophy of the basal septal segment.  Diastolic parameters normal.   #: End-stage renal disease: Patient has a permacath   Patient did receive her dialysis on  Friday            No need for renal placement therapy today             We will plan for next treatment  for Monday.  Patient currently working with PT/OT and has been able to sit in a chair for short periods of time, longest time of 2 hours.  We will continue to monitor this progress.  Patient has outpatient chair  at [facility] clinic that will be held until 01/27/2022.  If patient has not been discharged at that time, chair will be released and we will resubmit request.      #: Sepsis/fever likely from sacral wound. Biopsy negative for calciphylaxis. Prescribed oral Keflex.  Negative pressure wound VAC remains in place and managed by surgery team.  ID following, Derm consulted   #: Anemia secondary to chronic kidney disease complicated by sepsis. Has received blood transfusions during this admission.  -Hemoglobin 7.4, below target.  Remains on 10,000 units IV EPO with dialysis treatments.   #: Secondary hyperparathyroidism:  Calcium remains within acceptable range.  # Diabetes with CKD Lab Results  Component Value Date   HGBA1C 10.0 (H) 12/18/2021   Poorly controlled Diabetes Glucose control improved.  Primary team to manage SSI    LOS: 24 Carmita Boom s [facility] 6/25/20231:24 PM

## 2022-01-26 DIAGNOSIS — A419 Sepsis, unspecified organism: Secondary | ICD-10-CM | POA: Diagnosis not present

## 2022-01-26 DIAGNOSIS — N186 End stage renal disease: Secondary | ICD-10-CM | POA: Diagnosis not present

## 2022-01-26 DIAGNOSIS — R52 Pain, unspecified: Secondary | ICD-10-CM | POA: Diagnosis not present

## 2022-01-26 DIAGNOSIS — R6520 Severe sepsis without septic shock: Secondary | ICD-10-CM | POA: Diagnosis not present

## 2022-01-26 LAB — TYPE AND SCREEN
ABO/RH(D): O POS
Antibody Screen: NEGATIVE
Unit division: 0
Unit division: 0

## 2022-01-26 LAB — BPAM RBC
Blood Product Expiration Date: 202307182359
Blood Product Expiration Date: 202307182359
ISSUE DATE / TIME: 202306241804
ISSUE DATE / TIME: 202306250300
Unit Type and Rh: 5100
Unit Type and Rh: 5100

## 2022-01-26 LAB — CBC
HCT: 29.3 % — ABNORMAL LOW (ref 36.0–46.0)
Hemoglobin: 9.4 g/dL — ABNORMAL LOW (ref 12.0–15.0)
MCH: 27.8 pg (ref 26.0–34.0)
MCHC: 32.1 g/dL (ref 30.0–36.0)
MCV: 86.7 fL (ref 80.0–100.0)
Platelets: 261 10*3/uL (ref 150–400)
RBC: 3.38 MIL/uL — ABNORMAL LOW (ref 3.87–5.11)
RDW: 17 % — ABNORMAL HIGH (ref 11.5–15.5)
WBC: 12.2 10*3/uL — ABNORMAL HIGH (ref 4.0–10.5)
nRBC: 0 % (ref 0.0–0.2)

## 2022-01-26 LAB — RENAL FUNCTION PANEL
Albumin: 2.3 g/dL — ABNORMAL LOW (ref 3.5–5.0)
Anion gap: 12 (ref 5–15)
BUN: 58 mg/dL — ABNORMAL HIGH (ref 8–23)
CO2: 25 mmol/L (ref 22–32)
Calcium: 9 mg/dL (ref 8.9–10.3)
Chloride: 97 mmol/L — ABNORMAL LOW (ref 98–111)
Creatinine, Ser: 5.68 mg/dL — ABNORMAL HIGH (ref 0.44–1.00)
GFR, Estimated: 8 mL/min — ABNORMAL LOW (ref >60)
Glucose, Bld: 147 mg/dL — ABNORMAL HIGH (ref 70–99)
Phosphorus: 4.4 mg/dL (ref 2.5–4.6)
Potassium: 4.2 mmol/L (ref 3.5–5.1)
Sodium: 134 mmol/L — ABNORMAL LOW (ref 135–145)

## 2022-01-26 LAB — GLUCOSE, CAPILLARY
Glucose-Capillary: 140 mg/dL — ABNORMAL HIGH (ref 70–99)
Glucose-Capillary: 152 mg/dL — ABNORMAL HIGH (ref 70–99)
Glucose-Capillary: 120 mg/dL — ABNORMAL HIGH (ref 70–99)

## 2022-01-26 MED ORDER — OXYCODONE HCL 5 MG PO TABS: 5.0000 mg | ORAL_TABLET | ORAL | Status: DC | PRN

## 2022-01-26 MED ORDER — OXYCODONE HCL 5 MG PO TABS
5.0000 mg | ORAL_TABLET | Freq: Four times a day (QID) | ORAL | Status: DC | PRN
  Administered 2022-01-26 – 2022-01-29 (×8): 5 mg via ORAL
  Filled 2022-01-26 (×8): qty 1

## 2022-01-26 MED ORDER — EPOETIN ALFA 10000 UNIT/ML IJ SOLN
INTRAMUSCULAR | Status: AC
  Filled 2022-01-26: qty 1

## 2022-01-26 MED ORDER — HEPARIN SODIUM (PORCINE) 1000 UNIT/ML IJ SOLN
INTRAMUSCULAR | Status: AC
  Filled 2022-01-26: qty 10

## 2022-01-27 DIAGNOSIS — A419 Sepsis, unspecified organism: Secondary | ICD-10-CM | POA: Diagnosis not present

## 2022-01-27 DIAGNOSIS — R6520 Severe sepsis without septic shock: Secondary | ICD-10-CM | POA: Diagnosis not present

## 2022-01-27 LAB — GLUCOSE, CAPILLARY
Glucose-Capillary: 145 mg/dL — ABNORMAL HIGH (ref 70–99)
Glucose-Capillary: 260 mg/dL — ABNORMAL HIGH (ref 70–99)
Glucose-Capillary: 214 mg/dL — ABNORMAL HIGH (ref 70–99)
Glucose-Capillary: 265 mg/dL — ABNORMAL HIGH (ref 70–99)

## 2022-01-27 NOTE — Progress Notes (Signed)
PROGRESS NOTE    Theresa Warren  JXB:[id number] DOB: 1956-06-16 DOA: 12/31/2021 PCP: Rodrigo Ran, MD    Brief Narrative:  Brief hospital course: Theresa Warren is a 66 year old female hyperlipidemia, hypertension, CKD stage V, insulin-dependent diabetes mellitus, who presents emergency department from [facility] for evaluation of low sodium noted on labs at the facility.  She was discharged to rehab on 5/23 after being admitted following a fall and also with acute on chronic anemia.   ED Course - temp 99.6, HR 107, otherwise unremarkable vitals. Labs with sodium 129, potassium 5.1, chloride 99, bicarb 17, BUN of 100, serum creatinine of 4.61, GFR of 10, WBC 18.4, hemoglobin 6.9, platelets of 473.  LFT's were also elevated.   Patient met criteria for sepsis on arrival, treated per protocol in the ED with IV Cefepime, Flagyl, Vanc, and LR 1 L bolus fluids.  Infection source unclear but felt possibly due to sacral wound infection and concern for underlying osteomyelitis.   Admitted to the hospitalist service for further evaluation and management.  Continued on IV Cefepime and Vanc.   General surgery was consulted for debridement on sacral pressure injury.  Nephrology consulted due to worsening renal function.    Weeks summary from prior provider. Patient was lethargic and displayed uremic symptoms.  She was started on hemodialysis after tunneled dialysis catheter was placed by vascular surgery on 6/9.  First dialysis was started on 6/9.  In the evening she was febrile and has been spiking fevers for the past few days.  She was placed on IV meropenem broad-spectrum and ID was consulted.  Also she has left thigh induration wounds soft tissue ultrasound was negative and she was also negative for bilateral DVTs.   6/14: 2 separate wound cultures, 1 with MSSA and Enterococcus faecalis and second with MSSA and Staph epidermidis resistant to methicillin.  Antibiotics were switched to  meropenem.  ID is on board.  Patient had multiple wound debridements. 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 Autoimmune and vasculitis labs pending.  CK low at 10, acute hepatitis panel negative. Patient also has postmenopausal vaginal bleeding resulted in blood loss anemia, endometrial thickening on scan which will required outpatient gynecologic evaluation to rule out malignancy.  So far received 2 unit of PRBC.   6/15: Her wound continued to get worse.  Skin biopsy for concern of calciphylaxis was done by general surgery on 01/14/2022.  New pictures in ID note. ANA comprehensive profile was negative.  Rest of the autoimmune labs are pending.   6/16: ANCA profile negative.  Antiphospholipid antibody and skin biopsy is still pending. CBG elevated, increased Semglee to twice daily and added mealtime coverage. Preliminary skin biopsy reports with no calciphylaxis, some microthrombosis, slight sent to [facility] for dermatology department review. Multiple deep wounds and necrosis.  See today's surgery progress note for new pictures.   6/17: Patient antiphospholipid syndrome profile with negative anticardiolipin IgG and IgM but positive PTT lupus anticoagulant and DRVVT, on further characterization DRVVT mix was slightly elevated with normal DRVVT confirm.  Final skin biopsy results still pending.   6/18: CBG remained elevated, ongoing adjustment to her insulin.  Had a curbside consult with different rheumatologist and according to her recommendations she was suggesting stopping heparin for couple of days and repeat PTT lupus anticoagulant.  Patient has very nonspecific testing and does not make the cutoff for antiphospholipid syndrome.   6/19: Per nursing staff patient refusing daily dressing change and keep delaying which  interferes with their workflow.  Holding heparin and we can repeat PTT lupus anticoagulant tomorrow before restarting heparin for DVT prophylaxis.  Final skin  biopsy results are still pending Continues to have significant pain-palliative care was consulted for pain management. Pulm nephrology if she cannot sit in a dialysis chair, they might not be able to offer dialysis for a long time.   6/20: Palliative care was consulted yesterday for pain management, apparently patient was not using her as needed meds appropriately and they suggested giving a few doses daily.  Lupus anticoagulant was repeated as patient was off the heparin for 2 days.  Received second dose of sodium thiosulfate during dialysis yesterday.  Skin biopsy still pending. ID stopped antibiotics and observing now without them.   Week summary: Had HD. Weaning down pain meds. Broke into this reddish hives off abx , not sure whats causing it, but stopped diluadid and morphine and seems to slowly improving.  Had vaginal yeast infection- on vaginal cream treatment.  Had Fever again, bcx pending, started on iv abx empirically again.   The indurated infarcted wounds on her extremities look calciphylaxis to me but preliminary biopsy reported as not calciphylaxis by our pathologist. The indurated infarcted wounds on her extremities look calciphylaxis to me but preliminary biopsy reported as not calciphylaxis by our pathologist.Has been sent to [facility] dermatopathology.     Consultants:  Nephrology, palliative, ID  Procedures:   Antimicrobials:  meropenem   Subjective: No sob or cp.    Objective: Vitals:   01/26/22 2108 01/27/22 0458 01/27/22 0531 01/27/22 0733  BP: (!) 130/49  (!) 121/57 (!) 119/50  Pulse: 81  75 74  Resp: 16  16 16   Temp: 97.9 F (36.6 C)  98.8 F (37.1 C) 98.3 F (36.8 C)  TempSrc:    Oral  SpO2: 95%  96% 97%  Weight:  112 kg    Height:        Intake/Output Summary (Last 24 hours) at 01/27/2022 0813 Last data filed at 01/26/2022 1336 Gross per 24 hour  Intake --  Output 1000 ml  Net -1000 ml    Filed Weights   01/26/22 1008 01/26/22 1345 01/27/22  0458  Weight: 112 kg 111 kg 112 kg    Examination: Calm, NAD Heent: no rash Cta no w/r Reg s1/s2 no gallop Soft benign +bs +hives, some hives/rash, less than before Awake and alert. Mood and affect appropriate in current setting    Data Reviewed: I have personally reviewed following labs and imaging studies  CBC: Recent Labs  Lab 01/23/22 0811 01/24/22 1501 01/24/22 2324 01/25/22 0712 01/26/22 0913  WBC 15.1* 14.3*  --   --  12.2*  HGB 7.4* 6.9* 7.1* 8.6* 9.4*  HCT 23.5* 22.0* 22.5* 26.8* 29.3*  MCV 91.4 90.9  --   --  86.7  PLT 240 256  --   --  261   Basic Metabolic Panel: Recent Labs  Lab 01/23/22 0811 01/26/22 0913  NA 132* 134*  K 4.2 4.2  CL 94* 97*  CO2 25 25  GLUCOSE 151* 147*  BUN 61* 58*  CREATININE 4.55* 5.68*  CALCIUM 9.2 9.0  PHOS  --  4.4   GFR: Estimated Creatinine Clearance: 12 mL/min (A) (by C-G formula based on SCr of 5.68 mg/dL (H)). Liver Function Tests: Recent Labs  Lab 01/22/22 0429 01/26/22 0913  AST 66*  --   ALT 58*  --   ALKPHOS 174*  --   BILITOT 0.7  --  PROT 6.5  --   ALBUMIN 2.4* 2.3*   No results for input(s): "LIPASE", "AMYLASE" in the last 168 hours. No results for input(s): "AMMONIA" in the last 168 hours. Coagulation Profile: No results for input(s): "INR", "PROTIME" in the last 168 hours. Cardiac Enzymes: No results for input(s): "CKTOTAL", "CKMB", "CKMBINDEX", "TROPONINI" in the last 168 hours. BNP (last 3 results) No results for input(s): "PROBNP" in the last 8760 hours. HbA1C: No results for input(s): "HGBA1C" in the last 72 hours. CBG: Recent Labs  Lab 01/25/22 1625 01/25/22 2112 01/26/22 0843 01/26/22 1629 01/26/22 2109  GLUCAP 143* 154* 140* 152* 120*   Lipid Profile: No results for input(s): "CHOL", "HDL", "LDLCALC", "TRIG", "CHOLHDL", "LDLDIRECT" in the last 72 hours. Thyroid Function Tests: No results for input(s): "TSH", "T4TOTAL", "FREET4", "T3FREE", "THYROIDAB" in the last 72  hours. Anemia Panel: No results for input(s): "VITAMINB12", "FOLATE", "FERRITIN", "TIBC", "IRON", "RETICCTPCT" in the last 72 hours. Sepsis Labs: No results for input(s): "PROCALCITON", "LATICACIDVEN" in the last 168 hours.  Recent Results (from the past 240 hour(s))  Culture, blood (Routine X 2) w Reflex to ID Panel     Status: None (Preliminary result)   Collection Time: 01/24/22  3:01 PM   Specimen: BLOOD RIGHT HAND  Result Value Ref Range Status   Specimen Description BLOOD RIGHT HAND  Final   Special Requests   Final    BOTTLES DRAWN AEROBIC AND ANAEROBIC Blood Culture adequate volume   Culture   Final    NO GROWTH 2 DAYS Performed at [facility], [address], [place], [place] [address]    Report Status PENDING  Incomplete  Culture, blood (Routine X 2) w Reflex to ID Panel     Status: None (Preliminary result)   Collection Time: 01/25/22  9:13 PM   Specimen: BLOOD  Result Value Ref Range Status   Specimen Description BLOOD RIGHT ANTECUBITAL  Final   Special Requests   Final    BOTTLES DRAWN AEROBIC AND ANAEROBIC Blood Culture results may not be optimal due to an excessive volume of blood received in culture bottles   Culture   Final    NO GROWTH < 12 HOURS Performed at [facility], [address], [place], [place] [address]    Report Status PENDING  Incomplete          Radiology Studies: No results found.      Scheduled Meds:  (feeding supplement) PROSource Plus  30 mL Oral TID BM   vitamin C  500 mg Oral BID   Chlorhexidine Gluconate Cloth  6 each Topical Q0600   clotrimazole  1 Applicatorful Vaginal QHS   epoetin (EPOGEN/PROCRIT) injection  10,000 Units Intravenous Q M,W,F-HD   fluticasone  2 spray Each Nare Daily   gabapentin  100 mg Oral TID   insulin aspart  0-5 Units Subcutaneous QHS   insulin aspart  0-9 Units Subcutaneous TID WC   insulin aspart  5 Units Subcutaneous TID WC   insulin glargine-yfgn  8 Units Subcutaneous BID    leptospermum manuka honey  1 application  Topical Daily   lidocaine-EPINEPHrine  20 mL Intradermal Once   loratadine  10 mg Oral Daily   metoprolol succinate  25 mg Oral BID   multivitamin  1 tablet Oral QHS   nutrition supplement (JUVEN)  1 packet Oral BID BM   Continuous Infusions:  sodium chloride Stopped (01/19/22 1845)   meropenem (MERREM) IV 500 mg (01/26/22 1704)    Assessment &  Plan:   Principal Problem:   Severe sepsis (HCC) Active Problems:   Sacral wound, initial encounter   Swelling of left lower extremity   Chronic kidney disease, stage 5 (HCC)   AKI (acute kidney injury) (HCC)   Hypomagnesemia   Premature atrial complexes   Symptomatic anemia   Abdominal pain   Hyponatremia   Essential hypertension   Type 2 diabetes mellitus with chronic kidney disease, with long-term current use of insulin (HCC)   DNR (do not resuscitate)/DNI(Do Not Intubate)   OSA (obstructive sleep apnea)   Pressure injury of skin   Elevated LFTs   Anasarca associated with disorder of kidney   Calciphylaxis   Severe sepsis (HCC) POA as evidenced by tachycardia, leukocytosis, AKI and transaminitis consistent with organ dysfunction and severe sepsis with suspected infection source being sacral wounds versus an abdominal source (CT abdomen pelvis negative for anything acute however).  MRSA PCR negative. Wound cultures grew few Staph aureus, rare Enterococcus faecalis. Initially treated with vancomycin/cefepime. Vanc stopped 6/4.  --Transition to p.o. Keflex to complete course, and then transition to meropenem as she became febrile again. -Repeat wound cultures with MSSA and staph epi which was resistant to methicillin. 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 -Skin biopsy was done on 6/14 by general surgery, preliminary results are negative for calciphylaxis.  Vasculitis can be a possibility, very nonspecific elevation of lupus anticoagulant, patient was on  heparin.  -Sodium thiosulfate was given during dialysis on 6/14and 6/19 for concern of calciphylaxis -Meropenem was discontinued  and we will observe without antibiotics now. continue wound care Palliative following please see note  From 6/20 Is less 6/24 tmax 101, cbc pending, if wbc up may consider resuming meropenem 6/27 wbc coming down, bcx pending and so far TDN ID feels may have to remove HD catheter.        Swelling of left lower extremity Improving Initially with redness, patient attributes it to clindamycin.  She reported on admission that this has been present for over one month.   Left lower extremity Doppler US on 12/18/21 was negative for DVT.  6/27 keep legs elevated.       Vaginal Yeast Keep area dry-d/w nsg Hold on diflucan as LFT were elevated . 6/27 continue vaginal suppository       Sacral wound, initial encounter See above. 6/23 General surgery following.  Dry eschar in the edges.  No purulence or wet gangrene.  There is no absolute indication for further debridement.  6/22 continue wound vac /vac care Wound nsg following Dressing changed with WOC RN today Continue to pressure offload, frequent repositioning, low-air-loss mattress 6/27 continue Decadron gauze and ABD pads to buttocks.  Wound nursing had signed off        AKI on Chronic kidney disease, stage 5 (HCC) Now transition to ESRD and patient was started on dialysis. Nephrology following  Has dialysis outpatients spot however needs to be able to sit in chair for 2 hours so far this has not happened  Had HD yesterday      Premature atrial complexes Patient became significantly tachycardic on 6/2 with heart rates sustaining in the 130s.  Cardiology reviewed telemetry and feels patient dysrhythmias were not A-fib but sinus tach with PACs. --Cardiology following -- Continue Lopressor 25 mg BID     Hypomagnesemia Resolved with replacement on 6/3. Monitor and replace Mg as needed.      Symptomatic anemia Hemoglobin on admission was 6.9 >> 6.3, down from  7.6 at time of discharge on 5/23.  No signs of active bleeding, did have vaginal/endometrial bleeding previous admission but no reports of this recently.  May have slow chronic blood loss from sacral wounds.  Has baseline anemia of chronic disease due to renal failure. Received 2 unit PRBC transfusion . -Continue to monitor -Transfuse if below 7   Abdominal pain Patient reported on admission.  CT of abdomen pelvis showed abnormal endometrial thickening for a postmenopausal patient, bladder distention estimated 520 mL, no other acute or inflammatory processes or acute findings. --Monitor clinically for now -- Needs OB/GYN follow-up for endometrial sampling to exclude endometrial carcinoma   Hyponatremia Resolved.  Presented with sodium 129.  This was noted on labs at [facility] and was the reason patient presented to the ED. improved with IV hydration. -Continue to monitor    Essential hypertension Blood pressure mildly elevated. -Continue metoprolol Discontinued hydralazine (for possible cause of rahs) and lasix(by nephrology)   Rash Intermittently shows up in body, face, extremities. D/w ID too who noticed it today.  Rash improving. Diluadid and morphine stopped. Dc'd hydralazine. Possibly due to pain med as above.   Type 2 diabetes mellitus with chronic kidney disease, with long-term current use of insulin (HCC) CBG remained elevated, at 321 this morning -Increase Semglee to 8 units twice daily -Increase mealtime coverage to 5 units -Continue with SSI   DNR (do not resuscitate)/DNI(Do Not Intubate) Admitting hospitalist confirmed DNR CODE STATUS with patient during admission encounter   Calciphylaxis Concern of calciphylaxis due to nonhealing and worsening wound with necrosis. -Skin biopsy obtained, preliminary results were negative for calciphylaxis derm pathology from [facility] pending      Anasarca associated  with disorder of kidney Nephrology following. Treated with Lasix IV, later Lasix drip, IV albumin.  Nephrology switched back to PO Lasix today.  Monitor I/O's & Daily Wts   Elevated LFTs On admission, alk phos 212, AST 182, ALT 107.  Unclear etiology, repeat LFTs not available today.  Appears LFTs were elevated at time of discharge on 5/23 but have increased since. RUQ ultrasound unremarkable. Acute hepatitis panel negative. -- Follow CMP periodically   Pressure injury of skin Present on admission.  Patient has multiple sacral pressure injuries.  Some concern at time of admission that these were the source of sepsis. General surgery consulted and took patient to the OR on 6/1 for debridement.   Now with wound VAC. --Initial wound VAC change Monday 6/5 then every M/W/F -- Wound care consulted -- Frequent repositioning --Follow general surgery recommendations --Pain control --Follow blood cultures Pressure Injury 01/01/22 Buttocks Left Stage 3 -  Full thickness tissue loss. Subcutaneous fat may be visible but bone, tendon or muscle are NOT exposed. (Active)  01/01/22 0222  Location: Buttocks  Location Orientation: Left  Staging: Stage 3 -  Full thickness tissue loss. Subcutaneous fat may be visible but bone, tendon or muscle are NOT exposed.  Wound Description (Comments):   Present on Admission: Yes     Pressure Injury 01/01/22 Buttocks Right Stage 2 -  Partial thickness loss of dermis presenting as a shallow open injury with a red, pink wound bed without slough. (Active)  01/01/22 0223  Location: Buttocks  Location Orientation: Right  Staging: Stage 2 -  Partial thickness loss of dermis presenting as a shallow open injury with a red, pink wound bed without slough.  Wound Description (Comments):   Present on Admission: Yes     Pressure Injury 01/01/22 Hip Left Stage 2 -  Partial thickness loss of dermis presenting as a shallow open injury with a red, pink wound bed without slough.  (Active)  01/01/22 0225  Location: Hip  Location Orientation: Left  Staging: Stage 2 -  Partial thickness loss of dermis presenting as a shallow open injury with a red, pink wound bed without slough.  Wound Description (Comments):   Present on Admission: Yes     Pressure Injury 01/01/22 Hip Right Stage 3 -  Full thickness tissue loss. Subcutaneous fat may be visible but bone, tendon or muscle are NOT exposed. (Active)  01/01/22 0225  Location: Hip  Location Orientation: Right  Staging: Stage 3 -  Full thickness tissue loss. Subcutaneous fat may be visible but bone, tendon or muscle are NOT exposed.  Wound Description (Comments):   Present on Admission: Yes          OSA (obstructive sleep apnea) .  Appears not to be on CPAP     DVT prophylaxis: scd Code Status:dnr Family Communication: none at bedside Disposition Plan: SNF Status is: Inpatient Remains inpatient appropriate because: w/u pending      LOS: 26 days   Time spent: 35 min    Lynn Ito, MD [facility] Pager [phone]  If 7PM-7AM, please contact night-coverage 01/27/2022, 8:13 AM

## 2022-01-28 DIAGNOSIS — T07XXXA Unspecified multiple injuries, initial encounter: Secondary | ICD-10-CM

## 2022-01-28 DIAGNOSIS — N185 Chronic kidney disease, stage 5: Secondary | ICD-10-CM

## 2022-01-28 DIAGNOSIS — N186 End stage renal disease: Secondary | ICD-10-CM

## 2022-01-28 DIAGNOSIS — Z794 Long term (current) use of insulin: Secondary | ICD-10-CM

## 2022-01-28 DIAGNOSIS — S31000A Unspecified open wound of lower back and pelvis without penetration into retroperitoneum, initial encounter: Secondary | ICD-10-CM | POA: Diagnosis not present

## 2022-01-28 DIAGNOSIS — D649 Anemia, unspecified: Secondary | ICD-10-CM

## 2022-01-28 DIAGNOSIS — E1122 Type 2 diabetes mellitus with diabetic chronic kidney disease: Secondary | ICD-10-CM

## 2022-01-28 LAB — CBC
HCT: 27.1 % — ABNORMAL LOW (ref 36.0–46.0)
Hemoglobin: 8.3 g/dL — ABNORMAL LOW (ref 12.0–15.0)
MCH: 27 pg (ref 26.0–34.0)
MCHC: 30.6 g/dL (ref 30.0–36.0)
MCV: 88.3 fL (ref 80.0–100.0)
Platelets: 265 10*3/uL (ref 150–400)
RBC: 3.07 MIL/uL — ABNORMAL LOW (ref 3.87–5.11)
RDW: 16.1 % — ABNORMAL HIGH (ref 11.5–15.5)
WBC: 7.5 10*3/uL (ref 4.0–10.5)
nRBC: 0 % (ref 0.0–0.2)

## 2022-01-28 LAB — RENAL FUNCTION PANEL
Albumin: 2.1 g/dL — ABNORMAL LOW (ref 3.5–5.0)
Anion gap: 11 (ref 5–15)
BUN: 50 mg/dL — ABNORMAL HIGH (ref 8–23)
CO2: 28 mmol/L (ref 22–32)
Calcium: 8.9 mg/dL (ref 8.9–10.3)
Chloride: 97 mmol/L — ABNORMAL LOW (ref 98–111)
Creatinine, Ser: 4.37 mg/dL — ABNORMAL HIGH (ref 0.44–1.00)
GFR, Estimated: 11 mL/min — ABNORMAL LOW (ref >60)
Glucose, Bld: 165 mg/dL — ABNORMAL HIGH (ref 70–99)
Phosphorus: 3.5 mg/dL (ref 2.5–4.6)
Potassium: 3.9 mmol/L (ref 3.5–5.1)
Sodium: 136 mmol/L (ref 135–145)

## 2022-01-28 LAB — GLUCOSE, CAPILLARY
Glucose-Capillary: 197 mg/dL — ABNORMAL HIGH (ref 70–99)
Glucose-Capillary: 168 mg/dL — ABNORMAL HIGH (ref 70–99)
Glucose-Capillary: 177 mg/dL — ABNORMAL HIGH (ref 70–99)
Glucose-Capillary: 195 mg/dL — ABNORMAL HIGH (ref 70–99)

## 2022-01-28 LAB — SURGICAL PATHOLOGY

## 2022-01-28 MED ORDER — FENTANYL 12 MCG/HR TD PT72
1.0000 | MEDICATED_PATCH | TRANSDERMAL | Status: DC
  Administered 2022-01-28: 1 via TRANSDERMAL
  Filled 2022-01-28: qty 1

## 2022-01-28 MED ORDER — SENNOSIDES-DOCUSATE SODIUM 8.6-50 MG PO TABS
1.0000 | ORAL_TABLET | Freq: Two times a day (BID) | ORAL | Status: DC
  Administered 2022-01-28 – 2022-02-08 (×17): 1 via ORAL
  Filled 2022-01-28 (×25): qty 1

## 2022-01-28 MED ORDER — ACETAMINOPHEN 325 MG PO TABS
ORAL_TABLET | ORAL | Status: AC
  Filled 2022-01-28: qty 2

## 2022-01-28 MED ORDER — HEPARIN SODIUM (PORCINE) 1000 UNIT/ML IJ SOLN
INTRAMUSCULAR | Status: AC
  Filled 2022-01-28: qty 10

## 2022-01-28 MED ORDER — POLYETHYLENE GLYCOL 3350 17 G PO PACK
17.0000 g | PACK | Freq: Every day | ORAL | Status: DC
  Administered 2022-01-29 – 2022-02-06 (×5): 17 g via ORAL
  Filled 2022-01-28 (×12): qty 1

## 2022-01-28 MED ORDER — FENTANYL 12 MCG/HR TD PT72: 1.0000 | MEDICATED_PATCH | TRANSDERMAL | Status: DC

## 2022-01-28 MED ORDER — EPOETIN ALFA 10000 UNIT/ML IJ SOLN
INTRAMUSCULAR | Status: AC
  Filled 2022-01-28: qty 1

## 2022-01-28 NOTE — Progress Notes (Signed)
OT Cancellation Note  Patient Details Name: Theresa Warren MRN: [id number] DOB: 1956/06/13   Cancelled Treatment:    Reason Eval/Treat Not Completed: Fatigue/lethargy limiting ability to participate.  Pt just returned from dialysis and requested OT to check in later.  Will attempt later as time allows.  Leta Speller, MS, OTR/L  Darleene Cleaver 01/28/2022, 2:04 PM

## 2022-01-28 NOTE — Progress Notes (Signed)
[place] [facility] [place] NAME: Theresa Warren    MR#:  [id number]  DATE OF BIRTH:  Jun 28, 1956  SUBJECTIVE:   no family at bedside. Patient seen earlier. Got back from dialysis. Very tearful given significant pain in her right hip with the lesions she has. Tolerating PO diet. Trying to work with physical therapy. Unable to sit for hemodialysis due to her pain.   VITALS:  Blood pressure 132/71, pulse (!) 101, temperature 98 F (36.7 C), temperature source Oral, resp. rate 18, height 5\' 3"  (1.6 m), weight 104 kg, SpO2 98 %.  PHYSICAL EXAMINATION:   GENERAL:  66 y.o.-year-old patient lying in the bed with no acute distress. Morbidly obese appears chronically ill and deconditioned LUNGS: Normal breath sounds bilaterally, no wheezing, rales, rhonchi.  CARDIOVASCULAR: S1, S2 normal. No murmurs.  ABDOMEN: Soft, nontender, nondistended. Abdominal obesity NEUROLOGIC: nonfocal  patient is alert and awake SKIN: Areas of central infarction with surrounding painful induration over the upper thighs, inner thigs , left calf Gluteal wounds  Rt upper hip lesion    Left and rt gluteal debrided ulcers     LABORATORY PANEL:  CBC Recent Labs  Lab 01/28/22 1000  WBC 7.5  HGB 8.3*  HCT 27.1*  PLT 265    Chemistries  Recent Labs  Lab 01/22/22 0429 01/23/22 0811 01/28/22 1000  NA  --    < > 136  K  --    < > 3.9  CL  --    < > 97*  CO2  --    < > 28  GLUCOSE  --    < > 165*  BUN  --    < > 50*  CREATININE  --    < > 4.37*  CALCIUM  --    < > 8.9  AST 66*  --   --   ALT 58*  --   --   ALKPHOS 174*  --   --   BILITOT 0.7  --   --    < > = values in this interval not displayed.    Assessment and Plan Theresa Warren is a 66 year old female hyperlipidemia, hypertension, CKD stage V, insulin-dependent diabetes mellitus, who presents emergency department from [facility] for evaluation of low sodium noted on labs at the facility.  She  was discharged to rehab on 5/23 after being admitted following a fall and also with acute on chronic anemia.  Patient has been here for last 27 days. Please review detailed progress note in [facility] if needed.  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 Steva Ready has messaged dermatology to discuss and give curbside consultation -- currently off antibiotic  bilateral sacral wounds -- status post debridement with surgery -- continue dressing changes as per instructions  end-stage renal disease on hemodialysis -- patient will need outpatient hemodialysis. -- She is having a very hard time sitting up for dialysis given her morbid obesity significant pain from her skin lesions -- continue to encourage her to sit for dialysis  generalized weakness, deconditioning -- PT to see as and when able to -- recommends rehab however patient is adamant to go home. Will need to readdress that with patient and discussed with family  anemia of chronic disease -- status post two unit  blood transfusion -- no evidence of any active bleeding. -- No history of vaginally bleeding. 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 open area right below coccyx (Active)  12/20/21 0800  Location: Buttocks  Location Orientation: Left  Staging: Stage 4 - Full thickness tissue loss with exposed bone, tendon or muscle.  Wound  Description (Comments): open area right below coccyx  Present on Admission: Yes (First day with patient. Unknown if present at admission.)     Pressure Injury 01/01/22 Buttocks Left Stage 3 -  Full thickness tissue loss. Subcutaneous fat may be visible but bone, tendon or muscle are NOT exposed. (Active)  01/01/22 0222  Location: Buttocks  Location Orientation: Left  Staging: Stage 3 -  Full thickness tissue loss. Subcutaneous fat may be visible but bone, tendon or muscle are NOT exposed.  Wound Description (Comments):   Present on Admission: Yes     Pressure Injury 01/01/22 Buttocks Right Stage 4 - Full thickness tissue loss with exposed bone, tendon or muscle. (Active)  01/01/22 0223  Location: Buttocks  Location Orientation: Right  Staging: Stage 4 - Full thickness tissue loss with exposed bone, tendon or muscle.  Wound Description (Comments):   Present on Admission: Yes     Pressure Injury 01/01/22 Hip Left Stage 4 - Full thickness tissue loss with exposed bone, tendon or muscle. (Active)  01/01/22 0225  Location: Hip  Location Orientation: Left  Staging: Stage 4 - Full thickness tissue loss with exposed bone, tendon or muscle.  Wound Description (Comments):   Present on Admission: Yes     Pressure Injury 01/01/22 Hip Right Stage 3 -  Full thickness tissue loss. Subcutaneous fat may be visible but bone, tendon or muscle are NOT exposed. (Active)  01/01/22 0225  Location: Hip  Location Orientation: Right  Staging: Stage 3 -  Full thickness tissue loss. Subcutaneous fat may be visible but bone, tendon or muscle are NOT exposed.  Wound Description (Comments):   Present on Admission: Yes     Pressure Injury 01/12/22 Thigh Left;Upper Unstageable - Full thickness tissue loss in which the base of the injury is covered by slough (yellow, tan, gray, green or brown) and/or eschar (tan, brown or black) in the wound bed. previously noted "rugburns"  (Active)  01/12/22   Location: Thigh   Location Orientation: Left;Upper  Staging: Unstageable - Full thickness tissue loss in which the base of the injury is covered by slough (yellow, tan, gray, green or brown) and/or eschar (tan, brown or black) in the wound bed.  Wound Description (Comments): previously noted "rugburns" have evolved  Present on Admission: Yes     Pressure Injury 01/12/22 Thigh Left;Posterior Deep Tissue Pressure Injury - Purple or maroon localized area of discolored intact skin or blood-filled blister due to damage of underlying soft tissue from pressure and/or shear. previously noted " rugburns" (Active)  01/12/22   Location: Thigh  Location Orientation: Left;Posterior  Staging: Deep Tissue Pressure Injury - Purple or maroon localized area of discolored intact skin or blood-filled blister due to damage of underlying soft tissue from pressure and/or shear.  Wound Description (Comments): previously noted " rugburns" have  evolved  Present on Admission: Yes      Procedures: Family communication : none today Consults : ID, nephrology CODE STATUS: DNR DVT Prophylaxis : due to severe necrotic skin lesions (?heparin) unable to give it. Level of care: Med-Surg Status is: Inpatient Remains inpatient appropriate because: multiple medical comorbdities, Out pt HD, pain control for skin lesions    D/w dr Delaine Lame at length  [place]: 35 minutes.  >50% time spent on counselling and coordination of care  Note: This dictation was prepared with Dragon dictation along with smaller phrase technology. Any transcriptional errors that result from this process are unintentional.  Fritzi Mandes M.D    [facility]   CC: Primary care physician; Crist Infante, MD

## 2022-01-28 NOTE — TOC Progression Note (Signed)
Transition of Care [facility]) - Progression Note    Patient Details  Name: Theresa Warren MRN: [id number] Date of Birth: Apr 20, 1956  [facility]) CM/SW Contact  Donnelly Angelica, LCSW Phone Number: 01/28/2022, 3:05 PM  Clinical Narrative:    CSW contacted pt's cousin Jori Moll. Jori Moll stated that pt was confused and scared because she thought she authorized a SNF. CSW informed him that pt refused SNF and wants to go home with [facility]. Jori Moll had some questions for MD but said [name] is already expected a return call back form MD and is the [place]. Jori Moll denied having any further questions for CSW.   Expected Discharge Plan: [place] Barriers to Discharge: Continued Medical Work up  Expected Discharge Plan and Services Expected Discharge Plan: [place]   Discharge Planning Services: CM Consult Post Acute Care Choice:  (TBD) Living arrangements for the past 2 months: Single Family Home                                       Social Determinants of Health (SDOH) Interventions    Readmission Risk Interventions     No data to display

## 2022-01-28 NOTE — Progress Notes (Signed)
Treatment Date: 01/28/2022   Treatment Time: 3 hours    Access: Right CVC   UF Removed: 1008 ml   Next Treatment: 01/30/2022   Note:   HD treatment completed. Tolerated well, no adverse effects noted or reported. Patient hemodynamically stable. Report given to floor nurse Siri Cole, RN.

## 2022-01-28 NOTE — Progress Notes (Signed)
[facility]  PROGRESS NOTE   Subjective:   Patient seen and evaluated during dialysis   HEMODIALYSIS FLOWSHEET:  Blood Flow Rate (mL/min): 400 mL/min Arterial Pressure (mmHg): -190 mmHg Venous Pressure (mmHg): 170 mmHg Transmembrane Pressure (mmHg): 50 mmHg Ultrafiltration Rate (mL/min): 500 mL/min Dialysate Flow Rate (mL/min): 500 ml/min Conductivity: 13.7 Conductivity: 13.7 Dialysis Fluid Bolus: Normal Saline Bolus Amount (mL): 250 mL  No complaints at this time   Objective:  Vital signs: Blood pressure (!) 150/63, pulse 92, temperature 98.9 F (37.2 C), temperature source Oral, resp. rate (!) 25, height 5\' 3"  (1.6 m), weight 104 kg, SpO2 91 %.  Intake/Output Summary (Last 24 hours) at 01/28/2022 1320 Last data filed at 01/28/2022 1302 Gross per 24 hour  Intake 240 ml  Output 1008 ml  Net -768 ml    Filed Weights   01/28/22 0500 01/28/22 0927 01/28/22 1317  Weight: 108 kg 105 kg 104 kg     Physical Exam: General:  No acute distress  Head:  Normocephalic, atraumatic. Moist oral mucosal membranes  Eyes:  Anicteric  Lungs:   Clear to auscultation, normal effort  Heart:  S1S2 no rubs  Abdomen:   Soft, nontender, bowel sounds present  Extremities:  trace peripheral edema.  Neurologic:  Awake, alert, following commands  Skin:  No lesions, sacral wound  Access: RT Permcath    Basic Metabolic Panel: Recent Labs  Lab 01/23/22 0811 01/26/22 0913 01/28/22 1000  NA 132* 134* 136  K 4.2 4.2 3.9  CL 94* 97* 97*  CO2 25 25 28   GLUCOSE 151* 147* 165*  BUN 61* 58* 50*  CREATININE 4.55* 5.68* 4.37*  CALCIUM 9.2 9.0 8.9  PHOS  --  4.4 3.5     CBC: Recent Labs  Lab 01/23/22 0811 01/24/22 1501 01/24/22 2324 01/25/22 0712 01/26/22 0913 01/28/22 1000  WBC 15.1* 14.3*  --   --  12.2* 7.5  HGB 7.4* 6.9* 7.1* 8.6* 9.4* 8.3*  HCT 23.5* 22.0* 22.5* 26.8* 29.3* 27.1*  MCV 91.4 90.9  --   --  86.7 88.3  PLT 240 256  --   --  261 265       Urinalysis: No results for input(s): "COLORURINE", "LABSPEC", "PHURINE", "GLUCOSEU", "HGBUR", "BILIRUBINUR", "KETONESUR", "PROTEINUR", "UROBILINOGEN", "NITRITE", "LEUKOCYTESUR" in the last 72 hours.  Invalid input(s): "APPERANCEUR"    Imaging: No results found.   Medications:    sodium chloride Stopped (01/19/22 1845)   meropenem (MERREM) IV 500 mg (01/27/22 1708)    (feeding supplement) PROSource Plus  30 mL Oral TID BM   vitamin C  500 mg Oral BID   Chlorhexidine Gluconate Cloth  6 each Topical Q0600   clotrimazole  1 Applicatorful Vaginal QHS   epoetin alfa       epoetin (EPOGEN/PROCRIT) injection  10,000 Units Intravenous Q M,W,F-HD   fluticasone  2 spray Each Nare Daily   gabapentin  100 mg Oral TID   heparin sodium (porcine)       insulin aspart  0-5 Units Subcutaneous QHS   insulin aspart  0-9 Units Subcutaneous TID WC   insulin aspart  5 Units Subcutaneous TID WC   insulin glargine-yfgn  8 Units Subcutaneous BID   leptospermum manuka honey  1 application  Topical Daily   lidocaine-EPINEPHrine  20 mL Intradermal Once   loratadine  10 mg Oral Daily   metoprolol succinate  25 mg Oral BID   multivitamin  1 tablet Oral QHS   nutrition supplement (JUVEN)  1 packet Oral BID BM   polyethylene glycol  17 g Oral Daily   senna-docusate  1 tablet Oral BID    Assessment/ Plan:     Principal Problem:   Severe sepsis ([place]) Active Problems:   OSA (obstructive sleep apnea)   Chronic kidney disease, stage 5 (HCC)   Essential hypertension   DNR (do not resuscitate)/DNI(Do Not Intubate)   Type 2 diabetes mellitus with chronic kidney disease, with long-term current use of insulin (HCC)   Hypomagnesemia   Pressure injury of skin   Symptomatic anemia   Elevated LFTs   Sacral wound, initial encounter   Swelling of left lower extremity   Abdominal pain   AKI (acute kidney injury) ([place])   Hyponatremia   Premature atrial complexes   Anasarca associated with disorder  of kidney   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  Patient is reached end-stage renal disease and was started on renal replacement therapy.   2D echo from January 03, 2022-:LVEF 60 to 65%, no regional wall motion abnormalities, asymmetric left ventricular hypertrophy of the basal septal segment.  Diastolic parameters normal.   #: End-stage renal disease: Patient has a permacath   Receiving scheduled dialysis treatment.  UF 1 L as tolerated.  Patient refused to sit in chair due to fatigue.  This was discussed with patient, reiterated the need for her to sit in a chair to tolerate outpatient dialysis.  Patient states she will continue to work with PT towards his goals.   #: Sepsis/fever likely from sacral wound. Biopsy negative for calciphylaxis. Prescribed Meropenem, with plans to hold. Negative pressure wound VAC remains in place and managed by surgery team.  Skin biopsy sent to [facility].  ID continues to follow.   #: Anemia secondary to chronic kidney disease complicated by sepsis. Has received blood transfusions during this admission.  -Hemoglobin 8.3. Continue EPO with dialysis treatments.   #: Secondary hyperparathyroidism:  Calcium and phosphorus are currently at desired goal.  We will continue to monitor  # Diabetes with CKD Lab Results  Component Value Date   HGBA1C 10.0 (H) 12/18/2021  Glucose remains elevated.  Primary team to manage SSI    LOS: [facility] 6/28/20231:20 PM

## 2022-01-28 NOTE — Progress Notes (Signed)
ID Pt says she is feeling better Had HD  O/e awake  Patient Vitals for the past 24 hrs:  BP Temp Temp src Pulse Resp SpO2 Weight  01/28/22 1348 (!) 156/63 98 F (36.7 C) Oral 92 20 99 % --  01/28/22 1317 -- -- -- -- -- -- 104 kg  01/28/22 1315 -- 98.9 F (37.2 C) Oral -- (!) 25 -- --  01/28/22 1302 (!) 150/63 -- -- 92 11 91 % --  01/28/22 1230 (!) 156/66 -- -- 88 17 99 % --  01/28/22 1215 (!) 152/63 -- -- 88 18 98 % --  01/28/22 1200 (!) 155/71 -- -- 89 (!) 23 98 % --  01/28/22 1145 (!) 158/57 -- -- 87 20 99 % --  01/28/22 1130 (!) 138/56 -- -- 88 (!) 25 97 % --  01/28/22 1115 (!) 135/55 -- -- 86 15 97 % --  01/28/22 1100 (!) 162/67 -- -- 88 18 97 % --  01/28/22 1045 (!) 123/105 -- -- 89 (!) 27 97 % --  01/28/22 1030 (!) 134/57 -- -- 85 16 97 % --  01/28/22 1015 (!) 151/54 -- -- 86 20 100 % --  01/28/22 1000 105/70 -- -- 83 14 98 % --  01/28/22 0951 -- -- -- 83 18 95 % --  01/28/22 0927 -- -- -- -- -- -- 105 kg  01/28/22 0922 (!) 143/74 -- -- 92 20 97 % --  01/28/22 0914 -- 98.7 F (37.1 C) Oral -- -- -- --  01/28/22 0838 (!) 138/56 99.3 F (37.4 C) Oral 82 20 96 % --  01/28/22 0547 (!) 144/60 99.2 F (37.3 C) -- 85 20 99 % --  01/28/22 0500 -- -- -- -- -- -- 108 kg  01/27/22 2041 (!) 126/49 99.4 F (37.4 C) -- 86 16 97 % --    Face erythema less, but skin very dry Erythematous rash over legs     All wounds inspected Areas of central infarction with surrounding painful induration over the upper thighs, inner thigs , left calf Gluteal wounds  Rt upper hip lesion   Left and rt gluteal debrided ulcers    Labs    Latest Ref Rng & Units 01/26/2022    9:13 AM 01/25/2022    7:12 AM 01/24/2022   11:24 PM  CBC  WBC 4.0 - 10.5 K/uL 12.2     Hemoglobin 12.0 - 15.0 g/dL 9.4  8.6  7.1   Hematocrit 36.0 - 46.0 % 29.3  26.8  22.5   Platelets 150 - 400 K/uL 261       Impression/recommendation  ESRD on dialysis thru HD catheter   Bilateral buttock wounds which have  been debrided.  Pressure ulcers Had completed 19 days of antibiotic and she was off antibiotics since 01/20/2022.  Over the weekend She was back on meropenem because of fever and low-grade leukocytosis.   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    The differential diagnosis for this indurated infarcted wounds:a) calciphylaxis ( biopsy report from [facility] finalized no calcium in vessels) B) antiphospholipid antibody syndrome ( neg antibodies)  c) lupus panniculitis( normal ANA) , embolic manifestation ( need to get vascualr and cardiology involved,) d) Coumadin induced necrosis ( she has never been on coumadin)  e) cholesterol emboli, f) cardiac source of thrombus g) heparin induced necrosis ( gets heparin , but no thrombocytopenia) with a skin rash which is waxing and waning heparin could be a culprit Patient  also has an unexplained macular rash which is fleeting and moves around.  Initially it was thought to be due to antibiotics but even after stopping antibiotics it was worsening and hence other medications need to e looked into including heparin, dilaudid Pt needs to be seen by dermatologist Will need TEE to look at any thrombi, vegetation   Intermittent fever:    Recent vaginal bleed and ultrasound showing endometrial stripe thickening.  Recommend GYN for endometrial biopsy to rule out malignancy.  Discussed the management with the hospitalist

## 2022-01-28 NOTE — TOC Progression Note (Signed)
Transition of Care [facility]) - Progression Note    Patient Details  Name: Theresa Warren MRN: [id number] Date of Birth: October 10, 1955  Transition of Care [facility]) CM/SW Contact  Donnelly Angelica, LCSW Phone Number: 01/28/2022, 6:25 AM  Clinical Narrative:    CSW received a voicemail from pt's cousin requesting an update on biopsy results. CSW attempted to reach family member and was unsuccessful.    Expected Discharge Plan: [place] Barriers to Discharge: Continued Medical Work up  Expected Discharge Plan and Services Expected Discharge Plan: [place]   Discharge Planning Services: CM Consult Post Acute Care Choice:  (TBD) Living arrangements for the past 2 months: Single Family Home                                       Social Determinants of Health (SDOH) Interventions    Readmission Risk Interventions     No data to display

## 2022-01-28 NOTE — Progress Notes (Signed)
Nutrition Follow-up  DOCUMENTATION CODES:   Morbid obesity  INTERVENTION:   -Continue 30 ml Prosource Plus TID, each supplement provides 100 kcals and 15 grams protein -Continue 1 packet Juven BID, each packet provides 95 calories, 2.5 grams of protein (collagen), and 9.8 grams of carbohydrate (3 grams sugar); also contains 7 grams of L-arginine and L-glutamine, 300 mg vitamin C, 15 mg vitamin E, 1.2 mcg vitamin B-12, 9.5 mg zinc, 200 mg calcium, and 1.5 g  Calcium Beta-hydroxy-Beta-methylbutyrate to support wound healing  -Continue renal MVI daily -Continue 500 mg vitamin C BID -Last BM 01/23/22; messaged RN and MD regarding possibility of starting bowel regimen  NUTRITION DIAGNOSIS:   Increased nutrient needs related to wound healing as evidenced by estimated needs.  Ongoing  GOAL:   Patient will meet greater than or equal to 90% of their needs  Progressing  MONITOR:   PO intake, Supplement acceptance, Diet advancement  REASON FOR ASSESSMENT:   Malnutrition Screening Tool    ASSESSMENT:   Pt with PMH of hyperlipidemia, hypertension, insulin-dependent diabetes mellitus, who presents from [facility] for chief concerns of abnormal serum sodium levels on labs.  5/31- s/p Debridement of three decubitus wounds of bilateral buttocks, total debridement area of 133 cm2;  Placement of negative pressure dressing > 50 cm. 6/7- vac changed 6/9- vac changed 6/9- HD cath placed, HD initiated 6/13- soft tissue ultrasound reveals no evidence of abscess 6/14- s/p punch biopsy on lt inner and outerthighs due to possible calciphylaxis    Reviewed I/O's: +480 ml x 24 hours and -762 ml x 24 hours   Pt unavailable at time of visit. Attempted to speak with pt via call to hospital room phone, however, unable to reach.   Pt remains with good appetite. Noted meal completions 50-100%. Pt is taking supplements.   Noted pt has been refusing dressing changes.   Palliative care consulted  for pain control and goalf of care.   Pt with no BM since 01/23/22. Messaged RN and MD via secure chat to consider bowel regimen.   Medications reviewed and include vitamin C and epogen.   Labs reviewed: Na: 134, CBGS: 122-265 (inpatient orders for glycemic control are 0-5 units insulin aspart TID with meals, 5 units insulin aspart TID with meals, and 8 units insulin glargine-yfgn BID).    Diet Order:   Diet Order             Diet renal/carb modified with fluid restriction Diet-HS Snack? Nothing; Fluid restriction: 1200 mL Fluid; Room service appropriate? Yes; Fluid consistency: Thin  Diet effective now                   EDUCATION NEEDS:   Not appropriate for education at this time  Skin:  Skin Assessment: Skin Integrity Issues: Skin Integrity Issues:: Stage IV DTI: lt thigh Stage II: rt buttocks, bilateral buttocks, lt hips Stage III: lt buttocks, rt hip Stage IV: lt hip, lt buttocks, sacrum Wound Vac: buttocks  Last BM:  01/20/22  Height:   Ht Readings from Last 1 Encounters:  12/31/21 5\' 3"  (1.6 m)    Weight:   Wt Readings from Last 1 Encounters:  01/28/22 108 kg    Ideal Body Weight:  52.3 kg  BMI:  Body mass index is 42.18 kg/m.  Estimated Nutritional Needs:   Kcal:  1650-1850  Protein:  90-105 grams  Fluid:  1000 ml + UOP    Loistine Chance, RD, LDN, [place] Registered Dietitian II Certified Diabetes  Care and Education Specialist Please refer to [facility] for RD and/or RD on-call/weekend/after hours pager

## 2022-01-29 DIAGNOSIS — R6520 Severe sepsis without septic shock: Secondary | ICD-10-CM | POA: Diagnosis not present

## 2022-01-29 DIAGNOSIS — Z515 Encounter for palliative care: Secondary | ICD-10-CM

## 2022-01-29 DIAGNOSIS — T07XXXA Unspecified multiple injuries, initial encounter: Secondary | ICD-10-CM | POA: Diagnosis not present

## 2022-01-29 DIAGNOSIS — Z7189 Other specified counseling: Secondary | ICD-10-CM | POA: Diagnosis not present

## 2022-01-29 DIAGNOSIS — A419 Sepsis, unspecified organism: Secondary | ICD-10-CM | POA: Diagnosis not present

## 2022-01-29 DIAGNOSIS — S31000A Unspecified open wound of lower back and pelvis without penetration into retroperitoneum, initial encounter: Secondary | ICD-10-CM | POA: Diagnosis not present

## 2022-01-29 DIAGNOSIS — N186 End stage renal disease: Secondary | ICD-10-CM | POA: Diagnosis not present

## 2022-01-29 LAB — GLUCOSE, CAPILLARY
Glucose-Capillary: 149 mg/dL — ABNORMAL HIGH (ref 70–99)
Glucose-Capillary: 167 mg/dL — ABNORMAL HIGH (ref 70–99)
Glucose-Capillary: 184 mg/dL — ABNORMAL HIGH (ref 70–99)
Glucose-Capillary: 155 mg/dL — ABNORMAL HIGH (ref 70–99)

## 2022-01-29 MED ORDER — GABAPENTIN 100 MG PO CAPS
200.0000 mg | ORAL_CAPSULE | Freq: Three times a day (TID) | ORAL | Status: DC
  Administered 2022-01-29 – 2022-02-10 (×34): 200 mg via ORAL
  Filled 2022-01-29 (×35): qty 2

## 2022-01-29 MED ORDER — INSULIN ASPART 100 UNIT/ML IJ SOLN
7.0000 [IU] | Freq: Three times a day (TID) | INTRAMUSCULAR | Status: DC
  Administered 2022-01-29 – 2022-02-02 (×7): 7 [IU] via SUBCUTANEOUS
  Filled 2022-01-29 (×9): qty 1

## 2022-01-29 MED ORDER — OXYCODONE HCL 5 MG PO TABS
7.5000 mg | ORAL_TABLET | Freq: Four times a day (QID) | ORAL | Status: DC | PRN
  Administered 2022-01-29 – 2022-01-30 (×3): 7.5 mg via ORAL
  Filled 2022-01-29 (×3): qty 2

## 2022-01-29 MED ORDER — INSULIN GLARGINE-YFGN 100 UNIT/ML ~~LOC~~ SOLN
12.0000 [IU] | Freq: Two times a day (BID) | SUBCUTANEOUS | Status: DC
  Administered 2022-01-29: 12 [IU] via SUBCUTANEOUS
  Filled 2022-01-29 (×3): qty 0.12

## 2022-01-29 MED ORDER — ENSURE MAX PROTEIN PO LIQD
11.0000 [oz_av] | Freq: Two times a day (BID) | ORAL | Status: DC
  Administered 2022-01-29 – 2022-02-10 (×20): 11 [oz_av] via ORAL
  Filled 2022-01-29: qty 330

## 2022-01-29 NOTE — Progress Notes (Signed)
Patient sat up in recliner for 4 hours and tolerated it well. However she declines wound care at this time.

## 2022-01-29 NOTE — Progress Notes (Signed)
Physical Therapy Treatment Patient Details Name: Theresa Warren MRN: [id number] DOB: January 05, 1956 Today's Date: 01/29/2022   History of Present Illness [name] is a 66 year old female admitted for evaluation of low sodium noted on labs at the facility.  Pt was discharged  to rehab on 5/23 after being admitted following a fall. PMH significant for acute on chronic anemia. hyperlipidemia, hypertension, CKD stage V, insulin-dependent diabetes mellitus.    PT Comments    Pt mid-transfer to recliner with OT. Agreeable to further mobility. STS from low surface of recliner was very challenging for pt. She required MOD-MAX A+2 for lift, steadying, hip extension and heavy VC on technique. From recliner, she attempted STS x6 reps; she was successful with 2. Ambulation distance improved from 65ft to 84ft this date; pt responded well to setting a visual goal. Close chair follow was provided for pt safety. Pt encouraged to remain sitting in chair with goal of 4 hours, the time it will take to complete dialysis. Continue to rec d/c to SNF; pt is declining d/c rec stating she plans to d/c home. Recommending w/c with ROHO cushion for pressure relief due to pressure sores. Would benefit from skilled PT to address above deficits and promote optimal return to PLOF.   Recommendations for follow up therapy are one component of a multi-disciplinary discharge planning process, led by the attending physician.  Recommendations may be updated based on patient status, additional functional criteria and insurance authorization.  Follow Up Recommendations  Skilled nursing-short term rehab (<3 hours/day) Can patient physically be transported by private vehicle: No   Assistance Recommended at Discharge Frequent or constant Supervision/Assistance  Patient can return home with the following Two people to help with walking and/or transfers;Two people to help with bathing/dressing/bathroom;Assistance with  cooking/housework;Assist for transportation;Help with stairs or ramp for entrance   Equipment Recommendations  Rolling walker (2 wheels);BSC/3in1;Wheelchair cushion (measurements PT);Wheelchair (measurements PT);Hospital bed    Recommendations for Other Services       Precautions / Restrictions Precautions Precautions: Fall Precaution Comments: sacral & leg wounds Restrictions Weight Bearing Restrictions: No     Mobility  Bed Mobility               General bed mobility comments: Not assessed; mid-transfer with OT upon arrival to room    Transfers Overall transfer level: Needs assistance Equipment used: Rolling walker (2 wheels) Transfers: Sit to/from Stand Sit to Stand: +2 safety/equipment, Max assist           General transfer comment: Assist ranged from MOD-MAX A. She did require physical assist of 2 people today from recliner. 6 attempts, 2 successful with hip extension. Improved performance with youth walker.    Ambulation/Gait Ambulation/Gait assistance: Min assist, +2 safety/equipment Gait Distance (Feet): 10 Feet Assistive device: Rolling walker (2 wheels) Gait Pattern/deviations: Step-through pattern, Decreased stride length, Trunk flexed, Shuffle Gait velocity: decreased     General Gait Details: 65ft with very close chair follow by second person. MIN A to steady. Encouragement required; visual goal set for distance.   Stairs             Wheelchair Mobility    Modified Rankin (Stroke Patients Only)       Balance Overall balance assessment: Needs assistance Sitting-balance support: Feet supported, Bilateral upper extremity supported Sitting balance-Leahy Scale: Fair     Standing balance support: Bilateral upper extremity supported, During functional activity, Reliant on assistive device for balance Standing balance-Leahy Scale: Poor Standing balance comment: CGA-MIN A at  all times in standing using RW                             Cognition Arousal/Alertness: Awake/alert Behavior During Therapy: WFL for tasks assessed/performed, Flat affect Overall Cognitive Status: No family/caregiver present to determine baseline cognitive functioning Area of Impairment: Attention, Following commands, Awareness, Safety/judgement, Problem solving                   Current Attention Level: Selective   Following Commands: Follows one step commands with increased time, Follows one step commands inconsistently Safety/Judgement: Decreased awareness of safety, Decreased awareness of deficits Awareness: Emergent Problem Solving: Slow processing, Decreased initiation, Difficulty sequencing, Requires verbal cues, Requires tactile cues          Exercises      General Comments        Pertinent Vitals/Pain Pain Assessment Pain Assessment: Faces Faces Pain Scale: Hurts little more Pain Location: BLEs Pain Descriptors / Indicators: Grimacing, Guarding, Aching    Home Living                          Prior Function            PT Goals (current goals can now be found in the care plan section) Acute Rehab PT Goals Patient Stated Goal: to improve strength and mobility PT Goal Formulation: With patient Time For Goal Achievement: 02/03/22 Potential to Achieve Goals: Fair    Frequency    Min 2X/week      PT Plan      Co-evaluation PT/OT/SLP Co-Evaluation/Treatment: Yes Reason for Co-Treatment: Necessary to address cognition/behavior during functional activity;For patient/therapist safety;To address functional/ADL transfers PT goals addressed during session: Mobility/safety with mobility;Balance;Proper use of DME OT goals addressed during session: ADL's and self-care      AM-PAC PT "6 Clicks" Mobility   Outcome Measure  Help needed turning from your back to your side while in a flat bed without using bedrails?: A Little Help needed moving from lying on your back to sitting on the side of a  flat bed without using bedrails?: A Lot Help needed moving to and from a bed to a chair (including a wheelchair)?: A Lot Help needed standing up from a chair using your arms (e.g., wheelchair or bedside chair)?: A Lot Help needed to walk in hospital room?: A Lot Help needed climbing 3-5 steps with a railing? : Total 6 Click Score: 12    End of Session Equipment Utilized During Treatment: Gait belt Activity Tolerance: Patient limited by fatigue Patient left: in bed;with call bell/phone within reach;with bed alarm set Nurse Communication: Mobility status PT Visit Diagnosis: Unsteadiness on feet (R26.81);Muscle weakness (generalized) (M62.81);Difficulty in walking, not elsewhere classified (R26.2)     Time: 0931-1000 PT Time Calculation (min) (ACUTE ONLY): 29 min  Charges:  $Therapeutic Activity: 8-22 mins                     Patrina Levering PT, DPT

## 2022-01-29 NOTE — Progress Notes (Signed)
Referral to [facility] has expired. New referral will need to be started open discharge plan is confirmed. Clinic and time may change with new referral.

## 2022-01-29 NOTE — Progress Notes (Addendum)
Daily Progress Note   Patient Name: Theresa Warren       Date: 01/29/2022 DOB: [birth year]  Age: 66 y.o. MRN#: [id number] Attending Physician: Fritzi Mandes, MD Primary Care Physician: Crist Infante, MD Admit Date: 12/31/2021  Reason for Consultation/Follow-up: Establishing goals of care  Subjective: Notes and labs reviewed. Have spoken with nephrology regarding how she is doing with dialysis and pain management during dialysis; there is concern she does not want to sit for dialysis the length of time needed for outpatient chair dialysis. In to see patient. She states she does want to sit for dialysis but has pain. Discussed a gel foam pillow to sit on. She states the oxycodone makes her confused. We dicussed her pain and pre-medicating before dressing changes and dialysis. We have previously discussed not delaying pain medications as this will increase pain. She confirms DNR/ DNI and continuing all other care.  Spoke with attending on unit upon leaving room. Discussed pain management and evolution of recommendations while following patient based on needs, and signs and symptoms. Discussed low oxycodone requirements as per [facility] and staff, and sleepiness and confusion. In discussion with primary team previously, discussed multi-modal recommendations which primary team said they may consider, and  that primary team would manage pain, and PMT would continue to follow for [place]. Discussed that pathology returned and patient does not have calciphylaxis. Discussed pain is at sites of bed sores.   We discussed current plan as per primary team, and that a Fentanyl patch has been ordered and oxycodone has been increased by them. Discussed that if the goal is no longer weaning off narcotics I agree with a  fentanyl patch as she is a dialysis patient. We discussed dosing. Discussed oral oxycodone changes and an overall goal of weaning from oral meds, and then hopefully at some point, off patch. Discussed overall caution due to renal failure.   As part of multi-modal therapy, continue to recommend scheduled Tylenol. Consider increasing Neurontin for neuropathic pain.   Length of Stay: 28  Current Medications: Scheduled Meds:   (feeding supplement) PROSource Plus  30 mL Oral TID BM   vitamin C  500 mg Oral BID   Chlorhexidine Gluconate Cloth  6 each Topical Q0600   clotrimazole  1 Applicatorful Vaginal QHS   epoetin (EPOGEN/PROCRIT) injection  10,000 Units  Intravenous Q M,W,F-HD   fentaNYL  1 patch Transdermal Q72H   fluticasone  2 spray Each Nare Daily   gabapentin  100 mg Oral TID   insulin aspart  0-5 Units Subcutaneous QHS   insulin aspart  0-9 Units Subcutaneous TID WC   insulin aspart  5 Units Subcutaneous TID WC   insulin glargine-yfgn  8 Units Subcutaneous BID   leptospermum manuka honey  1 application  Topical Daily   loratadine  10 mg Oral Daily   metoprolol succinate  25 mg Oral BID   multivitamin  1 tablet Oral QHS   nutrition supplement (JUVEN)  1 packet Oral BID BM   polyethylene glycol  17 g Oral Daily   senna-docusate  1 tablet Oral BID    Continuous Infusions:  sodium chloride Stopped (01/19/22 1845)    PRN Meds: sodium chloride, acetaminophen, alteplase, heparin, lidocaine-prilocaine, Muscle Rub, ondansetron **OR** ondansetron (ZOFRAN) IV, ondansetron (ZOFRAN) IV, oxyCODONE, pentafluoroprop-tetrafluoroeth  Physical Exam Pulmonary:     Effort: Pulmonary effort is normal.  Neurological:     Mental Status: She is alert.             Vital Signs: BP (!) 144/55 (BP Location: Right Arm)   Pulse 76   Temp 99.5 F (37.5 C)   Resp 20   Ht 5\' 3"  (1.6 m)   Wt 104 kg   SpO2 97%   BMI 40.61 kg/m  SpO2: SpO2: 97 % O2 Device: O2 Device: Room Air O2 Flow Rate: O2  Flow Rate (L/min): 3 L/min  Intake/output summary:  Intake/Output Summary (Last 24 hours) at 01/29/2022 1119 Last data filed at 01/29/2022 7741 Gross per 24 hour  Intake 240 ml  Output 1008 ml  Net -768 ml   LBM: Last BM Date : 01/23/22 Baseline Weight: Weight: 97 kg Most recent weight: Weight: 104 kg       Palliative Assessment/Data: 50%      Patient Active Problem List   Diagnosis Date Noted   ESRD (end stage renal disease) ([place])    Calciphylaxis    Anasarca associated with disorder of kidney 01/05/2022   Premature atrial complexes 01/02/2022   Sacral wound, initial encounter 01/01/2022   Swelling of left lower extremity 01/01/2022   Abdominal pain 01/01/2022   AKI (acute kidney injury) ([place]) 01/01/2022   Hyponatremia 01/01/2022   Symptomatic anemia 12/31/2021   Elevated LFTs 12/31/2021   Severe sepsis (HCC) 12/31/2021   Hypokalemia 12/21/2021   Pressure injury of skin 12/20/2021   Hypomagnesemia 12/19/2021   Acute blood loss anemia 12/19/2021   Abnormal vaginal bleeding 12/19/2021   Uncontrolled type 2 diabetes mellitus with hyperglycemia, with long-term current use of insulin ([place]) 12/19/2021   Chronic kidney disease, stage 5 ([place]) 12/18/2021   Chronic pain syndrome 12/18/2021   Fall at home 12/18/2021   DNR (do not resuscitate)/DNI(Do Not Intubate) 12/18/2021   Type 2 diabetes mellitus with chronic kidney disease, with long-term current use of insulin ([place]) 12/18/2021   Anemia of renal disease 12/18/2021   Long term (current) use of insulin ([place]) 08/30/2018   OSA (obstructive sleep apnea) 11/27/2014   Sleep disturbance 10/10/2014   Severe obesity (BMI >= 40) ([place]) 10/10/2014   Palpitation 09/11/2014   Dyspnea 09/11/2014   Morbid (severe) obesity due to excess calories ([place]) 07/01/2010   Essential hypertension 06/06/2009    Palliative Care Assessment & Plan    Recommendations/Plan: Patient wants to continue trying dialysis. Needs a gel foam pad to sit on  in dialysis. Discussed pain management with attending as above.  Recommend scheduled Tylenol.  Recommend considering increasing Neurontin. Recommend outpatient palliative.  I will follow up Tuesday on my return.   Code Status:    Code Status Orders  (From admission, onward)           Start     Ordered   12/31/21 2333  Do not attempt resuscitation (DNR)  Continuous       Question Answer Comment  In the event of cardiac or respiratory ARREST Do not call a "code blue"   In the event of cardiac or respiratory ARREST Do not perform Intubation, CPR, defibrillation or ACLS   In the event of cardiac or respiratory ARREST Use medication by any route, position, wound care, and other measures to relive pain and suffering. May use oxygen, suction and manual treatment of airway obstruction as needed for comfort.   Comments verified with patient      12/31/21 2332           Code Status History     Date Active Date Inactive Code Status Order ID Comments User Context   12/18/2021 1711 12/23/2021 1816 DNR [id number]  Kristopher Oppenheim, DO Inpatient   12/18/2021 1432 12/18/2021 1711 DNR [id number]  Kristopher Oppenheim, DO ED      Advance Directive Documentation    Flowsheet Row Most Recent Value  Type of Advance Directive Out of facility DNR (pink MOST or yellow form)  Pre-existing out of facility DNR order (yellow form or pink MOST form) --  "MOST" Form in Place? --       Prognosis:  Unable to determine   Care plan was discussed with attending  Thank you for allowing the Palliative Medicine Team to assist in the care of this patient.   Asencion Gowda, NP  Please contact Palliative Medicine Team phone at [phone] for questions and concerns.

## 2022-01-29 NOTE — TOC Progression Note (Signed)
Transition of Care [facility]) - Progression Note    Patient Details  Name: Niamh Rada MRN: [id number] Date of Birth: Mar 29, 1956  [facility]) CM/SW [place], LCSW Phone Number: 01/29/2022, 1:57 PM  Clinical Narrative:    CSW requested DME (Manual Wheel Chair with Roho cushion) from [employer], per pT recommendation.    Expected Discharge Plan: [place] Barriers to Discharge: Continued Medical Work up  Expected Discharge Plan and Services Expected Discharge Plan: [place]   Discharge Planning Services: CM Consult Post Acute Care Choice:  (TBD) Living arrangements for the past 2 months: Single Family Home                                       Social Determinants of Health (SDOH) Interventions    Readmission Risk Interventions     No data to display

## 2022-01-29 NOTE — Progress Notes (Signed)
Occupational Therapy Treatment Patient Details Name: Theresa Warren MRN: [id number] DOB: 1956-07-09 Today's Date: 01/29/2022   History of present illness [name] is a 66 year old female admitted for evaluation of low sodium noted on labs at the facility.  Pt was discharged  to rehab on 5/23 after being admitted following a fall. PMH significant for acute on chronic anemia. hyperlipidemia, hypertension, CKD stage V, insulin-dependent diabetes mellitus.   OT comments  Chart reviewed, pt greeted in bed agreeable to OT tx session. Co tx completed with Pt. Re-evaluation completed on this date with pt noted to make progress towards goals. Pt reports she is continuing to refuse discharge to STR although she requires +2 assist for mobility and has a fall history. Pt provided extensive education and questioning re: help she will have at home, how she will mobilize throughout the house, where she will sleep, how she will get into the house (pt has 4 STE). Pt guarded and mildy receptive to therapist's suggestions. Discussed goal of upright sitting for at least 4 hrs in chair to facilitate ability to sit in chair at dialysis. Pt reports she was able to do this over the weekend. Improvements made in LB dressing, STS with MIN A, SPT with MIN A with RW, amb with youth walker with MIN A. Pt continues to require significant assist for ADL/mobility and OT continues to recommend to STR to address functional deficits however if pt does discharge home, recommend frequent/constant supervision, a lot of help with mobility/ADL tasks, manual wheelchair with roho cushion, ramp for house, bsc, hospital bed.   Pt is left in bedside chair, NAD, all needs met. OT will follow acutely.    Recommendations for follow up therapy are one component of a multi-disciplinary discharge planning process, led by the attending physician.  Recommendations may be updated based on patient status, additional functional criteria  and insurance authorization.    Follow Up Recommendations  Skilled nursing-short term rehab (<3 hours/day)    Assistance Recommended at Discharge Frequent or constant Supervision/Assistance  Patient can return home with the following  A lot of help with walking and/or transfers;A lot of help with bathing/dressing/bathroom;Assist for transportation;Help with stairs or ramp for entrance   Equipment Recommendations  Wheelchair cushion (measurements OT);Wheelchair (measurements OT);BSC/3in1;Hospital bed    Recommendations for Other Services      Precautions / Restrictions Precautions Precautions: Fall Precaution Comments: sacral & leg wounds Restrictions Weight Bearing Restrictions: No        ADL either performed or assessed with clinical judgement   ADL Overall ADL's : Needs assistance/impaired                                       General ADL Comments: supine>sit using log roll technique with MOD A, STS from raised surface with MIN A with RW, SPT to bedside chair with RW with CGA. Pt amb approx 10 feet with close chair follow with MIN A. Pt able to donn one sock with sueprvision, MAX A for donnign one sock due to fatigue.      Cognition Arousal/Alertness: Awake/alert Behavior During Therapy: WFL for tasks assessed/performed, Flat affect Overall Cognitive Status: No family/caregiver present to determine baseline cognitive functioning Area of Impairment: Attention, Following commands, Awareness, Safety/judgement, Problem solving                   Current Attention Level: Selective   Following  Commands: Follows one step commands with increased time, Follows one step commands inconsistently Safety/Judgement: Decreased awareness of safety, Decreased awareness of deficits Awareness: Emergent Problem Solving: Slow processing, Decreased initiation, Difficulty sequencing, Requires verbal cues, Requires tactile cues          Exercises Other  Exercises Other Exercises: edu re: importance of pressure relief/ oob mobility            Pertinent Vitals/ Pain       Pain Assessment Pain Assessment: Faces Faces Pain Scale: Hurts little more Pain Location: BLEs Pain Descriptors / Indicators: Grimacing, Guarding, Aching Pain Intervention(s): Limited activity within patient's tolerance, Monitored during session, Repositioned, Premedicated before session   Frequency  Min 2X/week        Progress Toward Goals  OT Goals(current goals can now be found in the care plan section)  Progress towards OT goals: Progressing toward goals  Acute Rehab OT Goals Patient Stated Goal: go home OT Goal Formulation: With patient Time For Goal Achievement: 02/12/22 Potential to Achieve Goals: Fair ADL Goals Pt Will Perform Upper Body Dressing: with set-up  Plan Discharge plan remains appropriate;Frequency remains appropriate    Co-evaluation    PT/OT/SLP Co-Evaluation/Treatment: Yes     OT goals addressed during session: ADL's and self-care      AM-PAC OT "6 Clicks" Daily Activity     Outcome Measure   Help from another person eating meals?: None Help from another person taking care of personal grooming?: A Little Help from another person toileting, which includes using toliet, bedpan, or urinal?: A Lot Help from another person bathing (including washing, rinsing, drying)?: A Lot Help from another person to put on and taking off regular upper body clothing?: A Little Help from another person to put on and taking off regular lower body clothing?: A Lot 6 Click Score: 16    End of Session Equipment Utilized During Treatment: Rolling walker (2 wheels);Gait belt  OT Visit Diagnosis: Other abnormalities of gait and mobility (R26.89);Muscle weakness (generalized) (M62.81)   Activity Tolerance Patient limited by pain   Patient Left in chair;with call bell/phone within reach;with chair alarm set (RN aware of Pt status)   Nurse  Communication Mobility status        Time: 0919-8022 OT Time Calculation (min): 46 min  Charges: OT General Charges $OT Visit: 1 Visit OT Evaluation $OT Re-eval: 1 Re-eval OT Treatments $Self Care/Home Management : 8-22 mins  Shanon Payor, OTD OTR/L  01/29/22, 10:42 AM

## 2022-01-29 NOTE — Progress Notes (Signed)
Theresa Warren    MR#:  [id number]  DATE OF BIRTH:  12-18-1955  SUBJECTIVE:   no family at bedside.out sitting in the chair. Pain present and worse on exertion Overall looks better today  VITALS:  Blood pressure (!) 144/55, pulse 76, temperature 99.5 F (37.5 C), resp. rate 20, height 5\' 3"  (1.6 m), weight 104 kg, SpO2 97 %.  PHYSICAL EXAMINATION:   GENERAL:  66 y.o.-year-old patient lying in the bed  Morbidly obese appears chronically ill and deconditioned LUNGS: Normal breath sounds bilaterally, no wheezing, rales, rhonchi.  CARDIOVASCULAR: S1, S2 normal. No murmurs.  ABDOMEN: Soft, nontender, nondistended. Abdominal obesity NEUROLOGIC: nonfocal  patient is alert and awake SKIN: Areas of central infarction with surrounding painful induration over the upper thighs, inner thigs , left calf Gluteal wounds  Rt upper hip lesion    Left and rt gluteal debrided ulcers     LABORATORY PANEL:  CBC Recent Labs  Lab 01/28/22 1000  WBC 7.5  HGB 8.3*  HCT 27.1*  PLT 265     Chemistries  Recent Labs  Lab 01/28/22 1000  NA 136  K 3.9  CL 97*  CO2 28  GLUCOSE 165*  BUN 50*  CREATININE 4.37*  CALCIUM 8.9     Assessment and Plan Theresa Warren is a 65 year old female hyperlipidemia, hypertension, CKD stage V, insulin-dependent diabetes mellitus, who presents emergency department from [place] for evaluation of low sodium noted on labs at the facility.  She was discharged to rehab on 5/23 after being admitted following a fall and also with acute on chronic anemia.  Patient has been here for last 27 days. Please review detailed progress note in [facility] if needed.  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 Steva Ready has messaged dermatology to discuss and give curbside consultation -- currently off antibiotic --continue aggressive wound care --discussed pain management--for now cont fentanyl patch +oxycodone. Moving forward increased fentanyl dose and decrease oxycodone dose to get better pain control. Pt agreeable.  bilateral sacral wounds -- status post debridement with surgery -- continue dressing changes as per instructions  end-stage renal disease on hemodialysis -- patient will need outpatient hemodialysis. -- She is having a very hard time sitting up for dialysis given her morbid obesity significant pain from her skin lesions -- continue to encourage her to sit for dialysis  generalized weakness, deconditioning -- PT to see as and when able to -- recommends rehab however patient is adamant to go home. Will need to readdress that with patient and discussed with family  anemia of chronic disease in the setting of ESRD -- status post two unit blood transfusion -- no evidence of any active bleeding. -- No history of vaginally bleeding. 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 open area right below coccyx (Active)  12/20/21  0800  Location: Buttocks  Location Orientation: Left  Staging: Stage 4 - Full thickness tissue loss with exposed bone, tendon or muscle.  Wound Description (Comments): open area right below coccyx  Present on Admission: Yes (First day with patient. Unknown  if present at admission.)     Pressure Injury 01/01/22 Buttocks Left Stage 3 -  Full thickness tissue loss. Subcutaneous fat may be visible but bone, tendon or muscle are NOT exposed. (Active)  01/01/22 0222  Location: Buttocks  Location Orientation: Left  Staging: Stage 3 -  Full thickness tissue loss. Subcutaneous fat may be visible but bone, tendon or muscle are NOT exposed.  Wound Description (Comments):   Present on Admission: Yes     Pressure Injury 01/01/22 Buttocks Right Stage 4 - Full thickness tissue loss with exposed bone, tendon or muscle. (Active)  01/01/22 0223  Location: Buttocks  Location Orientation: Right  Staging: Stage 4 - Full thickness tissue loss with exposed bone, tendon or muscle.  Wound Description (Comments):   Present on Admission: Yes     Pressure Injury 01/01/22 Hip Left Stage 4 - Full thickness tissue loss with exposed bone, tendon or muscle. (Active)  01/01/22 0225  Location: Hip  Location Orientation: Left  Staging: Stage 4 - Full thickness tissue loss with exposed bone, tendon or muscle.  Wound Description (Comments):   Present on Admission: Yes     Pressure Injury 01/01/22 Hip Right Stage 3 -  Full thickness tissue loss. Subcutaneous fat may be visible but bone, tendon or muscle are NOT exposed. (Active)  01/01/22 0225  Location: Hip  Location Orientation: Right  Staging: Stage 3 -  Full thickness tissue loss. Subcutaneous fat may be visible but bone, tendon or muscle are NOT exposed.  Wound Description (Comments):   Present on Admission: Yes     Pressure Injury 01/12/22 Thigh Left;Upper Unstageable - Full thickness tissue loss in which the base of the injury is covered by slough (yellow, tan, gray, green or brown) and/or eschar (tan, brown or black) in the wound bed. previously noted "rugburns"  (Active)  01/12/22   Location: Thigh  Location Orientation: Left;Upper  Staging: Unstageable - Full thickness tissue loss in which the base of the  injury is covered by slough (yellow, tan, gray, green or brown) and/or eschar (tan, brown or black) in the wound bed.  Wound Description (Comments): previously noted "rugburns" have evolved  Present on Admission: Yes     Pressure Injury 01/12/22 Thigh Left;Posterior Deep Tissue Pressure Injury - Purple or maroon localized area of discolored intact skin or blood-filled blister due to damage of underlying soft tissue from pressure and/or shear. previously noted " rugburns" (Active)  01/12/22   Location: Thigh  Location Orientation: Left;Posterior  Staging: Deep Tissue Pressure Injury - Purple or maroon localized area of discolored intact skin or blood-filled blister due to damage of underlying soft tissue from pressure and/or shear.  Wound Description (Comments): previously noted " rugburns" have evolved  Present on Admission: Yes      Family communication : none today Consults : ID, nephrology CODE STATUS: DNR DVT Prophylaxis : due to severe necrotic skin lesions (?heparin) unable to give it. Level of care: Med-Surg Status is: Inpatient Remains inpatient appropriate because: multiple medical comorbdities, Out pt HD, pain control for skin lesions     D/w dr Delaine Lame at length  [place]: 35 minutes.  >50% time spent on counselling and coordination of care  Note: This  dictation was prepared with Dragon dictation along with smaller phrase technology. Any transcriptional errors that result from this process are unintentional.  Fritzi Mandes M.D    [facility]   CC: Primary care physician; Crist Infante, MD

## 2022-01-29 NOTE — Progress Notes (Signed)
[facility]  PROGRESS NOTE   Subjective:   Patient seen sitting in chair, participating in therapy session.  Alert and oriented, confusion at times Therapy states she has sat in chair for 4 hours previously during a visit with friends.  Denies pain at this time Lower extremity edema remains, worse in left than right   Objective:  Vital signs: Blood pressure (!) 144/55, pulse 76, temperature 99.5 F (37.5 C), resp. rate 20, height 5\' 3"  (1.6 m), weight 104 kg, SpO2 97 %.  Intake/Output Summary (Last 24 hours) at 01/29/2022 1244 Last data filed at 01/29/2022 1000 Gross per 24 hour  Intake 600 ml  Output 1008 ml  Net -408 ml    Filed Weights   01/28/22 0500 01/28/22 0927 01/28/22 1317  Weight: 108 kg 105 kg 104 kg     Physical Exam: General:  No acute distress  Head:  Normocephalic, atraumatic. Moist oral mucosal membranes  Eyes:  Anicteric  Lungs:   Clear to auscultation, normal effort  Heart:  S1S2 no rubs  Abdomen:   Soft, nontender, bowel sounds present  Extremities:  Trace right peripheral edema.1+ left peripheral edema  Neurologic:  Awake, alert, following commands  Skin:  No lesions, sacral wound  Access: RT Permcath    Basic Metabolic Panel: Recent Labs  Lab 01/23/22 0811 01/26/22 0913 01/28/22 1000  NA 132* 134* 136  K 4.2 4.2 3.9  CL 94* 97* 97*  CO2 25 25 28   GLUCOSE 151* 147* 165*  BUN 61* 58* 50*  CREATININE 4.55* 5.68* 4.37*  CALCIUM 9.2 9.0 8.9  PHOS  --  4.4 3.5     CBC: Recent Labs  Lab 01/23/22 0811 01/24/22 1501 01/24/22 2324 01/25/22 0712 01/26/22 0913 01/28/22 1000  WBC 15.1* 14.3*  --   --  12.2* 7.5  HGB 7.4* 6.9* 7.1* 8.6* 9.4* 8.3*  HCT 23.5* 22.0* 22.5* 26.8* 29.3* 27.1*  MCV 91.4 90.9  --   --  86.7 88.3  PLT 240 256  --   --  261 265      Urinalysis: No results for input(s): "COLORURINE", "LABSPEC", "PHURINE", "GLUCOSEU", "HGBUR", "BILIRUBINUR", "KETONESUR", "PROTEINUR", "UROBILINOGEN", "NITRITE",  "LEUKOCYTESUR" in the last 72 hours.  Invalid input(s): "APPERANCEUR"    Imaging: No results found.   Medications:    sodium chloride Stopped (01/19/22 1845)    vitamin C  500 mg Oral BID   Chlorhexidine Gluconate Cloth  6 each Topical Q0600   clotrimazole  1 Applicatorful Vaginal QHS   epoetin (EPOGEN/PROCRIT) injection  10,000 Units Intravenous Q M,W,F-HD   fentaNYL  1 patch Transdermal Q72H   fluticasone  2 spray Each Nare Daily   gabapentin  100 mg Oral TID   insulin aspart  0-5 Units Subcutaneous QHS   insulin aspart  0-9 Units Subcutaneous TID WC   insulin aspart  5 Units Subcutaneous TID WC   insulin glargine-yfgn  8 Units Subcutaneous BID   leptospermum manuka honey  1 application  Topical Daily   loratadine  10 mg Oral Daily   metoprolol succinate  25 mg Oral BID   multivitamin  1 tablet Oral QHS   nutrition supplement (JUVEN)  1 packet Oral BID BM   polyethylene glycol  17 g Oral Daily   Ensure Max Protein  11 oz Oral BID   senna-docusate  1 tablet Oral BID    Assessment/ Plan:     Principal Problem:   Severe sepsis (HCC) Active Problems:   OSA (obstructive  sleep apnea)   Chronic kidney disease, stage 5 (HCC)   Essential hypertension   DNR (do not resuscitate)/DNI(Do Not Intubate)   Type 2 diabetes mellitus with chronic kidney disease, with long-term current use of insulin (HCC)   Hypomagnesemia   Pressure injury of skin   Symptomatic anemia   Elevated LFTs   Sacral wound, initial encounter   Swelling of left lower extremity   Abdominal pain   AKI (acute kidney injury) ([place])   Hyponatremia   Premature atrial complexes   Anasarca associated with disorder of kidney   Calciphylaxis   ESRD (end stage renal disease) ([place])  66 year old female with history of hypertension, diabetes, peripheral vascular disease, hyperlipidemia, now with sepsis and acute kidney injury on the top of chronic kidney disease.  Patient is reached end-stage renal disease and was  started on renal replacement therapy.   2D echo from January 03, 2022-:LVEF 60 to 65%, no regional wall motion abnormalities, asymmetric left ventricular hypertrophy of the basal septal segment.  Diastolic parameters normal.   #: End-stage renal disease: Patient has a permacath   Dialysis received yesterday, UF 1L achieved. Next treatment scheduled for tomorrow in chair. Will resubmit for outpatient chair due to current request expiration. Transportation will need to be arranged initially until formal transport situated.    #: Sepsis/fever likely from sacral wound. Biopsy negative for calciphylaxis. Prescribed Meropenem, with plans to hold. Negative pressure wound VAC remains in place and managed by surgery team.  Skin biopsy sent to [facility].  ID continues to follow.   #: Anemia secondary to chronic kidney disease complicated by sepsis. Has received blood transfusions during this admission.  - Continue EPO with dialysis treatments.   #: Secondary hyperparathyroidism:  We will continue to monitor  # Diabetes with CKD Lab Results  Component Value Date   HGBA1C 10.0 (H) 12/18/2021  Glucose remains elevated at times  Primary team to manage SSI    LOS: [facility] 6/29/202312:44 PM

## 2022-01-30 DIAGNOSIS — A419 Sepsis, unspecified organism: Secondary | ICD-10-CM | POA: Diagnosis not present

## 2022-01-30 DIAGNOSIS — R6520 Severe sepsis without septic shock: Secondary | ICD-10-CM | POA: Diagnosis not present

## 2022-01-30 DIAGNOSIS — L270 Generalized skin eruption due to drugs and medicaments taken internally: Secondary | ICD-10-CM

## 2022-01-30 DIAGNOSIS — N186 End stage renal disease: Secondary | ICD-10-CM | POA: Diagnosis not present

## 2022-01-30 DIAGNOSIS — Z992 Dependence on renal dialysis: Secondary | ICD-10-CM | POA: Diagnosis not present

## 2022-01-30 LAB — GLUCOSE, CAPILLARY
Glucose-Capillary: 138 mg/dL — ABNORMAL HIGH (ref 70–99)
Glucose-Capillary: 154 mg/dL — ABNORMAL HIGH (ref 70–99)
Glucose-Capillary: 167 mg/dL — ABNORMAL HIGH (ref 70–99)
Glucose-Capillary: 76 mg/dL (ref 70–99)

## 2022-01-30 LAB — CULTURE, BLOOD (ROUTINE X 2)
Culture: NO GROWTH
Culture: NO GROWTH
Special Requests: ADEQUATE

## 2022-01-30 MED ORDER — INSULIN GLARGINE-YFGN 100 UNIT/ML ~~LOC~~ SOLN
15.0000 [IU] | Freq: Two times a day (BID) | SUBCUTANEOUS | Status: DC
  Administered 2022-01-31 – 2022-02-05 (×9): 15 [IU] via SUBCUTANEOUS
  Filled 2022-01-30 (×14): qty 0.15

## 2022-01-30 MED ORDER — SODIUM THIOSULFATE 250 MG/ML IV SOLN
25.0000 g | INTRAVENOUS | Status: DC
  Administered 2022-01-30 – 2022-02-06 (×4): 25 g via INTRAVENOUS
  Filled 2022-01-30 (×5): qty 100

## 2022-01-30 MED ORDER — OXYCODONE HCL 5 MG PO TABS
5.0000 mg | ORAL_TABLET | Freq: Four times a day (QID) | ORAL | Status: DC | PRN
  Administered 2022-01-30 – 2022-02-10 (×25): 5 mg via ORAL
  Filled 2022-01-30 (×26): qty 1

## 2022-01-30 MED ORDER — FENTANYL 25 MCG/HR TD PT72
1.0000 | MEDICATED_PATCH | TRANSDERMAL | Status: DC
  Administered 2022-01-30 – 2022-02-05 (×3): 1 via TRANSDERMAL
  Filled 2022-01-30 (×4): qty 1

## 2022-01-30 MED ORDER — HEPARIN SODIUM (PORCINE) 1000 UNIT/ML IJ SOLN
INTRAMUSCULAR | Status: AC
  Filled 2022-01-30: qty 10

## 2022-01-30 MED ORDER — EPOETIN ALFA 10000 UNIT/ML IJ SOLN
INTRAMUSCULAR | Status: AC
  Filled 2022-01-30: qty 1

## 2022-01-30 NOTE — Progress Notes (Signed)
I attempted to change pt wound 3 times throughout shit. Each time I asked patient( 0800, 1330, and 1510) she stated that she did not want them cjhanged today and that she would do them in the morning. I educated pt on the effects of her decision and wound care orders. She stated that she understood but was to tired from Dialysis and ambulating today that she just wanted to rest andd be left alone. MD made aware no new orders placed at this time. I will continue to monitor and will make oncoming RN aware.

## 2022-01-30 NOTE — Progress Notes (Addendum)
ID Pt says she is feeling better Pain is much better she says Rash has faded Had HD  O/e awake and alert, no distress Patient Vitals for the past 24 hrs:  BP Temp Temp src Pulse Resp SpO2 Weight  01/30/22 1015 (!) 143/78 -- -- (!) 114 18 98 % --  01/30/22 1000 137/73 -- -- (!) 104 14 99 % --  01/30/22 0945 (!) 135/56 -- -- (!) 107 13 99 % --  01/30/22 0930 131/64 -- -- (!) 102 19 98 % --  01/30/22 0915 (!) 143/77 -- -- 99 (!) 24 95 % --  01/30/22 0900 (!) 139/104 99.8 F (37.7 C) Oral (!) 103 15 95 % --  01/30/22 0855 (!) 150/68 -- -- (!) 105 11 97 % --  01/30/22 0850 -- -- -- -- -- -- 97.6 kg  01/30/22 0609 (!) 100/54 98.5 F (36.9 C) -- 73 17 100 % --  01/29/22 2041 (!) 123/49 98.6 F (37 C) -- 82 15 95 % --  01/29/22 1556 (!) 130/56 99.1 F (37.3 C) -- 83 18 99 % --  01/29/22 1048 -- -- -- 76 -- -- --    Face erythema resolved     Erythematous rash over legs resolved 01/30/22     01/26/22   01/30/22 Over abdomen rash has resolved   01/26/22   All wounds inspected Areas of central infarction with surrounding painful induration over the upper thighs, inner thigs , left calf Gluteal wounds covered with dressing   Labs    Latest Ref Rng & Units 01/28/2022   10:00 AM 01/26/2022    9:13 AM 01/25/2022    7:12 AM  CBC  WBC 4.0 - 10.5 K/uL 7.5  12.2    Hemoglobin 12.0 - 15.0 g/dL 8.3  9.4  8.6   Hematocrit 36.0 - 46.0 % 27.1  29.3  26.8   Platelets 150 - 400 K/uL 265  261      .    Latest Ref Rng & Units 01/28/2022   10:00 AM 01/26/2022    9:13 AM 01/23/2022    8:11 AM  CMP  Glucose 70 - 99 mg/dL 165  147  151   BUN 8 - 23 mg/dL 50  58  61   Creatinine 0.44 - 1.00 mg/dL 4.37  5.68  4.55   Sodium 135 - 145 mmol/L 136  134  132   Potassium 3.5 - 5.1 mmol/L 3.9  4.2  4.2   Chloride 98 - 111 mmol/L 97  97  94   CO2 22 - 32 mmol/L 28  25  25    Calcium 8.9 - 10.3 mg/dL 8.9  9.0  9.2     Impression/recommendation  ESRD on dialysis thru HD  catheter   Bilateral buttock wounds which have been debrided.  Pressure ulcers VS calciphylaxis ( very likely the latter)- staph aureus and enterococcus in culture Had completed 19 days of antibiotic and she was off antibiotics since 01/20/2022.  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   The differential diagnosis for this indurated infarcted wounds:a) calciphylaxis ( biopsy report from [facility] finalized no calcium in vessels ) But this is still clincially calciphylaxis ( reviewed pictures unofficially with dermatologist) B) antiphospholipid antibody syndrome ( neg antibodies)  c) lupus panniculitis( normal ANA) ,  embolic manifestation ( 2 d echo no vegetations and also it will not be deep,) d) Coumadin induced necrosis ( she has never been on coumadin)   heparin induced necrosis ( gets heparin , but no thrombocytopenia and also it is superficial involving the skin)   Macular erythematous rash over trunk, legs and arms and face- it waxed and waned - resolved since stopping dilaudid on 01/26/22   Initially it was thought to be due to antibiotics but even after stopping antibiotics it was worsening and hence dilaudid was stopped on 01/26/22 and the rash has resolved   Pt needs to be seen by dermatologist as OP . We dont have in patient dermatologist service    Recent vaginal bleed and ultrasound showing endometrial stripe thickening.  Recommend GYN for endometrial biopsy to rule out malignancy.  Discussed the management with the patient , hospitalist and nephrologist ID will sign off call if needed

## 2022-01-30 NOTE — Progress Notes (Signed)
[facility] NAME: Theresa Warren    MR#:  [id number]  DATE OF BIRTH:  [birth year]  SUBJECTIVE:   patient seen at dialysis today. She is getting her dialysis in the recliner chair. Overall slowly improving. Discussed pain regimen but her. VITALS:  Blood pressure (!) 131/59, pulse (!) 111, temperature 99.8 F (37.7 C), temperature source Oral, resp. rate 16, height 5\' 3"  (1.6 m), weight 97.6 kg, SpO2 98 %.  PHYSICAL EXAMINATION:   GENERAL:  66 y.o.-year-old patient lying in the bed  Morbidly obese appears chronically ill and deconditioned LUNGS: Normal breath sounds bilaterally, no wheezing, rales, rhonchi.  CARDIOVASCULAR: S1, S2 normal. No murmurs.  ABDOMEN: Soft, nontender, nondistended. Abdominal obesity NEUROLOGIC: nonfocal  patient is alert and awake SKIN: Areas of central infarction with surrounding painful induration over the upper thighs, inner thigs , left calf Gluteal wounds  Rt upper hip lesion    Left and rt gluteal debrided ulcers     LABORATORY PANEL:  CBC Recent Labs  Lab 01/28/22 1000  WBC 7.5  HGB 8.3*  HCT 27.1*  PLT 265     Chemistries  Recent Labs  Lab 01/28/22 1000  NA 136  K 3.9  CL 97*  CO2 28  GLUCOSE 165*  BUN 50*  CREATININE 4.37*  CALCIUM 8.9     Assessment and Plan Theresa Warren is a 66 year old female hyperlipidemia, hypertension, CKD stage V, insulin-dependent diabetes mellitus, who presents emergency department from [place] for evaluation of low sodium noted on labs at the facility.  She was discharged to rehab on 5/23 after being admitted following a fall and also with acute on chronic anemia.  Patient has been here for last 27 days. Please review detailed progress note in [facility] if needed.  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 Steva Ready has messaged dermatology to discuss and give curbside consultation -- currently off antibiotic --continue aggressive wound care --discussed pain management--for now cont fentanyl patch +oxycodone. Moving forward increased fentanyl dose and decrease oxycodone dose to get better pain control. Pt agreeable.  bilateral sacral wounds -- status post debridement with surgery -- continue dressing changes as per instructions  end-stage renal disease on hemodialysis -- patient will need outpatient hemodialysis-- dialysis coordinator working on and -- She is having a very hard time sitting up for dialysis given her morbid obesity significant pain from her skin lesions -- patient sat in the recliner at dialysis. Will continue to do the same  generalized weakness, deconditioning -- PT to see as and when able to -- recommends rehab however patient is adamant to go home.  -- DME ordered  anemia of chronic disease in the setting of ESRD -- status post two unit blood transfusion -- no evidence of any active bleeding. -- No history of vaginally bleeding. 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 open area right below coccyx (Active)  12/20/21 0800  Location: Buttocks  Location Orientation: Left  Staging: Stage 4 - Full thickness tissue loss with exposed bone, tendon or muscle.  Wound Description (Comments): open area right  below coccyx  Present on Admission: Yes (First day with patient. Unknown if present at admission.)     Pressure Injury 01/01/22 Buttocks Left Stage 3 -  Full thickness tissue loss. Subcutaneous fat may be visible but bone, tendon or muscle are NOT exposed. (Active)  01/01/22 0222  Location: Buttocks  Location Orientation: Left  Staging: Stage 3 -  Full thickness tissue loss. Subcutaneous fat may be visible but bone, tendon or muscle are NOT exposed.  Wound Description (Comments):   Present on Admission: Yes     Pressure Injury 01/01/22 Buttocks Right Stage 4 - Full thickness tissue loss with exposed bone, tendon or muscle. (Active)  01/01/22 0223  Location: Buttocks  Location Orientation: Right  Staging: Stage 4 - Full thickness tissue loss with exposed bone, tendon or muscle.  Wound Description (Comments):   Present on Admission: Yes     Pressure Injury 01/01/22 Hip Left Stage 4 - Full thickness tissue loss with exposed bone, tendon or muscle. (Active)  01/01/22 0225  Location: Hip  Location Orientation: Left  Staging: Stage 4 - Full thickness tissue loss with exposed bone, tendon or muscle.  Wound Description (Comments):   Present on Admission: Yes     Pressure Injury 01/01/22 Hip Right Stage 3 -  Full thickness tissue loss. Subcutaneous fat may be visible but bone, tendon or muscle are NOT exposed. (Active)  01/01/22 0225  Location: Hip  Location Orientation: Right  Staging: Stage 3 -  Full thickness tissue loss. Subcutaneous fat may be visible but bone, tendon or muscle are NOT exposed.  Wound Description (Comments):   Present on Admission: Yes     Pressure Injury 01/12/22 Thigh Left;Upper Unstageable - Full thickness tissue loss in which the base of the injury is covered by slough (yellow, tan, gray, green or brown) and/or eschar (tan, brown or black) in the wound bed. previously noted "rugburns"  (Active)  01/12/22   Location: Thigh  Location Orientation: Left;Upper   Staging: Unstageable - Full thickness tissue loss in which the base of the injury is covered by slough (yellow, tan, gray, green or brown) and/or eschar (tan, brown or black) in the wound bed.  Wound Description (Comments): previously noted "rugburns" have evolved  Present on Admission: Yes     Pressure Injury 01/12/22 Thigh Left;Posterior Deep Tissue Pressure Injury - Purple or maroon localized area of discolored intact skin or blood-filled blister due to damage of underlying soft tissue from pressure and/or shear. previously noted " rugburns" (Active)  01/12/22   Location: Thigh  Location Orientation: Left;Posterior  Staging: Deep Tissue Pressure Injury - Purple or maroon localized area of discolored intact skin or blood-filled blister due to damage of underlying soft tissue from pressure and/or shear.  Wound Description (Comments): previously noted " rugburns" have evolved  Present on Admission: Yes      Family communication : friend [name] on the phone Consults : ID, nephrology CODE STATUS: DNR DVT Prophylaxis : due to severe necrotic skin lesions (?heparin) unable to give it. Level of care: Med-Surg Status is: Inpatient Remains inpatient appropriate because: multiple medical comorbdities, Out pt HD, pain control for skin lesions     D/w dr Delaine Lame at length.  Anticipate discharge early next week wants outpatient dialysis chair  time is established. Patient and her friend [name] is aware.  TOTAL TIME TAKING CARE OF THIS PATIENT: 35 minutes.  >50% time spent on counselling and coordination of care  Note: This dictation was prepared with Dragon dictation along with smaller phrase technology. Any transcriptional errors that result from this process are unintentional.  Fritzi Mandes M.D    [facility]   CC: Primary care physician; Crist Infante, MD

## 2022-01-30 NOTE — Progress Notes (Signed)
Pt refused wound care at this time

## 2022-01-30 NOTE — Progress Notes (Signed)
[facility]  PROGRESS NOTE   Subjective:   Patient seen and evaluated during dialysis   HEMODIALYSIS FLOWSHEET:  Blood Flow Rate (mL/min): 400 mL/min Arterial Pressure (mmHg): -180 mmHg Venous Pressure (mmHg): 140 mmHg Transmembrane Pressure (mmHg): 20 mmHg Ultrafiltration Rate (mL/min): 210 mL/min Dialysate Flow Rate (mL/min): 600 ml/min Conductivity: 14 Conductivity: 14 Dialysis Fluid Bolus: Normal Saline Bolus Amount (mL): 250 mL  Patient currently seated in recliner to receive treatment.  Tolerating well.  No complaints of pain or discomfort.   Objective:  Vital signs: Blood pressure (!) 131/59, pulse (!) 111, temperature 99.8 F (37.7 C), temperature source Oral, resp. rate 16, height 5\' 3"  (1.6 m), weight 97.6 kg, SpO2 98 %.  Intake/Output Summary (Last 24 hours) at 01/30/2022 1219 Last data filed at 01/29/2022 1653 Gross per 24 hour  Intake 690 ml  Output --  Net 690 ml    Filed Weights   01/28/22 0927 01/28/22 1317 01/30/22 0850  Weight: 105 kg 104 kg 97.6 kg     Physical Exam: General:  No acute distress  Head:  Normocephalic, atraumatic. Moist oral mucosal membranes  Eyes:  Anicteric  Lungs:   Clear to auscultation, normal effort  Heart:  S1S2 no rubs  Abdomen:   Soft, nontender, bowel sounds present  Extremities:  Trace right peripheral edema.1+ left peripheral edema  Neurologic:  Awake, alert, following commands  Skin:  No lesions, sacral wound  Access: RT Permcath    Basic Metabolic Panel: Recent Labs  Lab 01/26/22 0913 01/28/22 1000  NA 134* 136  K 4.2 3.9  CL 97* 97*  CO2 25 28  GLUCOSE 147* 165*  BUN 58* 50*  CREATININE 5.68* 4.37*  CALCIUM 9.0 8.9  PHOS 4.4 3.5     CBC: Recent Labs  Lab 01/24/22 1501 01/24/22 2324 01/25/22 0712 01/26/22 0913 01/28/22 1000  WBC 14.3*  --   --  12.2* 7.5  HGB 6.9* 7.1* 8.6* 9.4* 8.3*  HCT 22.0* 22.5* 26.8* 29.3* 27.1*  MCV 90.9  --   --  86.7 88.3  PLT 256  --   --  261 265       Urinalysis: No results for input(s): "COLORURINE", "LABSPEC", "PHURINE", "GLUCOSEU", "HGBUR", "BILIRUBINUR", "KETONESUR", "PROTEINUR", "UROBILINOGEN", "NITRITE", "LEUKOCYTESUR" in the last 72 hours.  Invalid input(s): "APPERANCEUR"    Imaging: No results found.   Medications:    sodium chloride Stopped (01/19/22 1845)   sodium thiosulfate 25 g in sodium chloride 0.9 % 200 mL Infusion for Calciphylaxis 25 g (01/30/22 0952)    vitamin C  500 mg Oral BID   Chlorhexidine Gluconate Cloth  6 each Topical Q0600   clotrimazole  1 Applicatorful Vaginal QHS   epoetin alfa       epoetin (EPOGEN/PROCRIT) injection  10,000 Units Intravenous Q M,W,F-HD   fentaNYL  1 patch Transdermal Q72H   fluticasone  2 spray Each Nare Daily   gabapentin  200 mg Oral TID   heparin sodium (porcine)       insulin aspart  0-5 Units Subcutaneous QHS   insulin aspart  0-9 Units Subcutaneous TID WC   insulin aspart  7 Units Subcutaneous TID WC   insulin glargine-yfgn  12 Units Subcutaneous BID   leptospermum manuka honey  1 application  Topical Daily   loratadine  10 mg Oral Daily   metoprolol succinate  25 mg Oral BID   multivitamin  1 tablet Oral QHS   nutrition supplement (JUVEN)  1 packet Oral BID BM  polyethylene glycol  17 g Oral Daily   Ensure Max Protein  11 oz Oral BID   senna-docusate  1 tablet Oral BID    Assessment/ Plan:     Principal Problem:   Severe sepsis ([place]) Active Problems:   OSA (obstructive sleep apnea)   Chronic kidney disease, stage 5 (HCC)   Essential hypertension   DNR (do not resuscitate)/DNI(Do Not Intubate)   Type 2 diabetes mellitus with chronic kidney disease, with long-term current use of insulin (HCC)   Hypomagnesemia   Pressure injury of skin   Symptomatic anemia   Elevated LFTs   Sacral wound, initial encounter   Swelling of left lower extremity   Abdominal pain   AKI (acute kidney injury) ([place])   Hyponatremia   Premature atrial complexes    Anasarca associated with disorder of kidney   Calciphylaxis   ESRD (end stage renal disease) ([place])  66 year old female with history of hypertension, diabetes, peripheral vascular disease, hyperlipidemia, now with sepsis and acute kidney injury on the top of chronic kidney disease.  Patient is reached end-stage renal disease and was started on renal replacement therapy.   2D echo from January 03, 2022-:LVEF 60 to 65%, no regional wall motion abnormalities, asymmetric left ventricular hypertrophy of the basal septal segment.  Diastolic parameters normal.   #: End-stage renal disease on dialysis:  Patient currently receiving hemodialysis seated in chair, tolerating well.  UF goal reduced to 0.5 to 1 L as tolerated.  Renal navigator to resubmit for outpatient chair.  Next treatment scheduled for Monday.     #: Sepsis/fever likely from sacral wound. Biopsy negative for calciphylaxis. Prescribed Meropenem, with plans to hold. Negative pressure wound VAC remains in place and managed by surgery team.  Skin biopsy sent to [facility], negative for calciphylaxis.  ID following and recommends continuation of sodium thiosulfate with dialysis treatments.  This has been ordered.   #: Anemia secondary to chronic kidney disease complicated by sepsis. Has received blood transfusions during this admission.  -We will obtain updated labs in AM.  Continue EPO with dialysis treatments.   #: Secondary hyperparathyroidism:  We will obtain updated labs in AM.  # Diabetes with CKD Lab Results  Component Value Date   HGBA1C 10.0 (H) 12/18/2021  Glucose remains elevated at times  Primary team to manage SSI    LOS: [facility] 6/30/202312:19 PM

## 2022-01-31 DIAGNOSIS — S31000A Unspecified open wound of lower back and pelvis without penetration into retroperitoneum, initial encounter: Secondary | ICD-10-CM | POA: Diagnosis not present

## 2022-01-31 DIAGNOSIS — T07XXXA Unspecified multiple injuries, initial encounter: Secondary | ICD-10-CM | POA: Diagnosis not present

## 2022-01-31 DIAGNOSIS — N186 End stage renal disease: Secondary | ICD-10-CM | POA: Diagnosis not present

## 2022-01-31 LAB — CBC
HCT: 28.5 % — ABNORMAL LOW (ref 36.0–46.0)
Hemoglobin: 9.2 g/dL — ABNORMAL LOW (ref 12.0–15.0)
MCH: 27.7 pg (ref 26.0–34.0)
MCHC: 32.3 g/dL (ref 30.0–36.0)
MCV: 85.8 fL (ref 80.0–100.0)
Platelets: 252 10*3/uL (ref 150–400)
RBC: 3.32 MIL/uL — ABNORMAL LOW (ref 3.87–5.11)
RDW: 16.1 % — ABNORMAL HIGH (ref 11.5–15.5)
WBC: 11.2 10*3/uL — ABNORMAL HIGH (ref 4.0–10.5)
nRBC: 0 % (ref 0.0–0.2)

## 2022-01-31 LAB — GLUCOSE, CAPILLARY
Glucose-Capillary: 144 mg/dL — ABNORMAL HIGH (ref 70–99)
Glucose-Capillary: 124 mg/dL — ABNORMAL HIGH (ref 70–99)
Glucose-Capillary: 46 mg/dL — ABNORMAL LOW (ref 70–99)
Glucose-Capillary: 83 mg/dL (ref 70–99)
Glucose-Capillary: 88 mg/dL (ref 70–99)

## 2022-01-31 LAB — RENAL FUNCTION PANEL
Albumin: 2.3 g/dL — ABNORMAL LOW (ref 3.5–5.0)
Anion gap: 17 — ABNORMAL HIGH (ref 5–15)
BUN: 41 mg/dL — ABNORMAL HIGH (ref 8–23)
CO2: 23 mmol/L (ref 22–32)
Calcium: 9.6 mg/dL (ref 8.9–10.3)
Chloride: 94 mmol/L — ABNORMAL LOW (ref 98–111)
Creatinine, Ser: 3.03 mg/dL — ABNORMAL HIGH (ref 0.44–1.00)
GFR, Estimated: 17 mL/min — ABNORMAL LOW (ref >60)
Glucose, Bld: 130 mg/dL — ABNORMAL HIGH (ref 70–99)
Phosphorus: 2 mg/dL — ABNORMAL LOW (ref 2.5–4.6)
Potassium: 3.3 mmol/L — ABNORMAL LOW (ref 3.5–5.1)
Sodium: 134 mmol/L — ABNORMAL LOW (ref 135–145)

## 2022-01-31 MED ORDER — MEDIHONEY WOUND/BURN DRESSING EX PSTE
1.0000 | PASTE | Freq: Every day | CUTANEOUS | Status: DC
  Administered 2022-02-02 – 2022-02-08 (×5): 1 via TOPICAL
  Filled 2022-01-31 (×3): qty 44

## 2022-01-31 NOTE — Progress Notes (Signed)
    Durable Medical Equipment  (From admission, onward)           Start     Ordered   01/30/22 1223  For home use only DME Walker rolling  Once       Question Answer Comment  Walker: With [place] [employer]   Patient needs a walker to treat with the following condition General weakness      01/30/22 1222   01/30/22 1222  For home use only DME Hospital bed  Once       Question Answer Comment  Length of Need Lifetime   Bed type Semi-electric      01/30/22 1222   01/15/22 1611  For home use only DME Other see comment  Once       Comments: High profile roho cushion-  Patient seat width 19", seat depth 23"  Question:  Length of Need  Answer:  Lifetime   01/15/22 1612

## 2022-01-31 NOTE — Progress Notes (Signed)
Hypoglycemic Event  CBG: 46  Treatment: 8 oz juice/soda  Symptoms: None  Follow-up CBG: Time:1726 CBG Result:83  Possible Reasons for Event: Inadequate meal intake  Comments/MD notified:no    Thurmon Fair

## 2022-01-31 NOTE — Progress Notes (Signed)
   01/30/22 2100  Mobility  HOB Elevated/Bed Position HOB 30  Activity Turned to back - supine  Range of Motion/Exercises Active;Right arm;Left arm  Level of Assistance +2 (takes two people)  Games developer wheel walker  Activity Response Tolerated fair

## 2022-01-31 NOTE — Progress Notes (Signed)
[place] [facility] [place] NAME: Theresa Warren    MR#:  [id number]  DATE OF BIRTH:  15-Jul-1956  SUBJECTIVE:   patient seen at dialysis today. She is getting her dialysis in the recliner chair. Overall slowly improving. Discussed pain regimen but her. VITALS:  Blood pressure (!) 157/71, pulse 88, temperature 99.1 F (37.3 C), resp. rate 18, height 5\' 3"  (1.6 m), weight 97.6 kg, SpO2 100 %.  PHYSICAL EXAMINATION:   GENERAL:  65 y.o.-year-old patient lying in the bed  Morbidly obese appears chronically ill and deconditioned LUNGS: Normal breath sounds bilaterally, no wheezing, rales, rhonchi.  CARDIOVASCULAR: S1, S2 normal. No murmurs.  ABDOMEN: Soft, nontender, nondistended. Abdominal obesity NEUROLOGIC: nonfocal  patient is alert and awake SKIN: Areas of central infarction with surrounding painful induration over the upper thighs, inner thigs , left calf  Wounds as of 01/31/22               LABORATORY PANEL:  CBC Recent Labs  Lab 01/31/22 0645  WBC 11.2*  HGB 9.2*  HCT 28.5*  PLT 252     Chemistries  Recent Labs  Lab 01/31/22 0645  NA 134*  K 3.3*  CL 94*  CO2 23  GLUCOSE 130*  BUN 41*  CREATININE 3.03*  CALCIUM 9.6     Assessment and Plan Theresa Warren is a 65 year old female hyperlipidemia, hypertension, CKD stage V, insulin-dependent diabetes mellitus, who presents emergency department from [place] for evaluation of low sodium noted on labs at the facility.  She was discharged to rehab on 5/23 after being admitted following a fall and also with acute on chronic anemia.  Patient has been here for last 27 days. Please review detailed progress note in [facility] if needed.  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 Steva Ready has messaged dermatology to discuss and give curbside consultation -- currently off antibiotic --continue aggressive wound care --discussed pain management--for now cont fentanyl patch +oxycodone. Moving forward increased fentanyl dose and decrease oxycodone dose to get better pain control. Pt agreeable. --Wound pictures as off 01/31/2022 taken  Bilateral sacral wounds -- status post debridement with surgery -- continue dressing changes as per instructions  End-stage renal disease on hemodialysis -- patient will need outpatient hemodialysis-- dialysis coordinator working on and -- She is having a very hard time sitting up for dialysis given her morbid obesity significant pain from her skin lesions -- patient sat in the recliner at dialysis. Will continue to do the same  generalized weakness, deconditioning -- PT to see as and when able to -- recommends rehab however patient is adamant to go home.  -- DME ordered  anemia of chronic disease in the setting of ESRD -- status post two unit blood transfusion -- no evidence of any active bleeding. -- No history of vaginally bleeding. -- CT scan of the abdomen showed abnormal endometrial thickening for postmenopausal woman--- patient recommended to follow-up with GYN if symptoms recur  hypertension  --continue metoprolol  Type 2 Dm with end-stage renal disease, long-term use of insulin -- continue current insulin regimen and adjust according to sugars  Pressure Injury 12/20/21 Buttocks Left Stage 4 - Full thickness tissue loss with exposed bone,  tendon or muscle. open area right below coccyx (Active)  12/20/21 0800  Location: Buttocks  Location Orientation: Left  Staging: Stage 4 - Full thickness tissue loss with exposed bone, tendon or muscle.  Wound Description (Comments): open area right below coccyx  Present  on Admission: Yes (First day with patient. Unknown if present at admission.)     Pressure Injury 01/01/22 Buttocks Left Stage 3 -  Full thickness tissue loss. Subcutaneous fat may be visible but bone, tendon or muscle are NOT exposed. (Active)  01/01/22 0222  Location: Buttocks  Location Orientation: Left  Staging: Stage 3 -  Full thickness tissue loss. Subcutaneous fat may be visible but bone, tendon or muscle are NOT exposed.  Wound Description (Comments):   Present on Admission: Yes     Pressure Injury 01/01/22 Buttocks Right Stage 4 - Full thickness tissue loss with exposed bone, tendon or muscle. (Active)  01/01/22 0223  Location: Buttocks  Location Orientation: Right  Staging: Stage 4 - Full thickness tissue loss with exposed bone, tendon or muscle.  Wound Description (Comments):   Present on Admission: Yes     Pressure Injury 01/01/22 Hip Left Stage 4 - Full thickness tissue loss with exposed bone, tendon or muscle. (Active)  01/01/22 0225  Location: Hip  Location Orientation: Left  Staging: Stage 4 - Full thickness tissue loss with exposed bone, tendon or muscle.  Wound Description (Comments):   Present on Admission: Yes     Pressure Injury 01/01/22 Hip Right Stage 3 -  Full thickness tissue loss. Subcutaneous fat may be visible but bone, tendon or muscle are NOT exposed. (Active)  01/01/22 0225  Location: Hip  Location Orientation: Right  Staging: Stage 3 -  Full thickness tissue loss. Subcutaneous fat may be visible but bone, tendon or muscle are NOT exposed.  Wound Description (Comments):   Present on Admission: Yes     Pressure Injury 01/12/22 Thigh Left;Upper Unstageable - Full thickness tissue loss in which the base of the injury is covered by slough (yellow, tan, gray, green or brown) and/or eschar (tan, brown or black) in the wound bed. previously noted "rugburns"  (Active)  01/12/22   Location: Thigh  Location Orientation: Left;Upper  Staging: Unstageable - Full  thickness tissue loss in which the base of the injury is covered by slough (yellow, tan, gray, green or brown) and/or eschar (tan, brown or black) in the wound bed.  Wound Description (Comments): previously noted "rugburns" have evolved  Present on Admission: Yes     Pressure Injury 01/12/22 Thigh Left;Posterior Deep Tissue Pressure Injury - Purple or maroon localized area of discolored intact skin or blood-filled blister due to damage of underlying soft tissue from pressure and/or shear. previously noted " rugburns" (Active)  01/12/22   Location: Thigh  Location Orientation: Left;Posterior  Staging: Deep Tissue Pressure Injury - Purple or maroon localized area of discolored intact skin or blood-filled blister due to damage of underlying soft tissue from pressure and/or shear.  Wound Description (Comments): previously noted " rugburns" have evolved  Present on Admission: Yes    Overall poor prognosis  Family communication : friend [name] on the phone  96/29) Consults : ID, nephrology CODE STATUS: DNR DVT Prophylaxis : due to severe necrotic skin lesions (?heparin) unable to give it. Level of care: Med-Surg Status is: Inpatient Remains inpatient appropriate because: multiple medical comorbdities, Out pt HD, pain control for skin lesions     D/w dr Delaine Lame at length.  Anticipate discharge early next  week wants outpatient dialysis chair time is established. Patient and her friend [name] is aware.  TOTAL TIME TAKING CARE OF THIS PATIENT: 35 minutes.  >50% time spent on counselling and coordination of care  Note: This dictation was prepared with Dragon dictation along with smaller phrase technology. Any transcriptional errors that result from this process are unintentional.  Fritzi Mandes M.D    [facility]   CC: Primary care physician; Crist Infante, MD

## 2022-01-31 NOTE — TOC Progression Note (Signed)
Transition of Care [facility]) - Progression Note    Patient Details  Name: Shalona Harbour MRN: [id number] Date of Birth: 24-Mar-1956  [facility]) CM/SW Contact  Izola Price, RN Phone Number: 01/31/2022, 11:27 AM  Clinical Narrative: 7/1: Follow up on DME with Delana Meyer at [facility] on hospital bed, RW, and wheelchair. Indicated it had not been started. Order in as of 6/30. Narrative progress notes placed for co-sign. Simmie Davies RN CM       Expected Discharge Plan: Skilled Nursing Facility Barriers to Discharge: Continued Medical Work up  Expected Discharge Plan and Services Expected Discharge Plan: [place]   Discharge Planning Services: CM Consult Post Acute Care Choice:  (TBD) Living arrangements for the past 2 months: Single Family Home                                       Social Determinants of Health (SDOH) Interventions    Readmission Risk Interventions     No data to display

## 2022-01-31 NOTE — Progress Notes (Signed)
Patient refusing scheduled wound care at this time. Patient educated on importance of wound care and scheduled dressing changes. Patient continues to refuse, states she "will allow it to be done later today"

## 2022-01-31 NOTE — Progress Notes (Signed)
[facility]  PROGRESS NOTE   Subjective:   Patient underwent hemodialysis treatment yesterday in chair. Tolerated well. Appears to be in good spirits today.   Objective:  Vital signs: Blood pressure (!) 157/71, pulse 88, temperature 99.1 F (37.3 C), resp. rate 18, height 5\' 3"  (1.6 m), weight 97.6 kg, SpO2 100 %.  Intake/Output Summary (Last 24 hours) at 01/31/2022 1211 Last data filed at 01/31/2022 1000 Gross per 24 hour  Intake 680 ml  Output --  Net 680 ml    Filed Weights   01/28/22 0927 01/28/22 1317 01/30/22 0850  Weight: 105 kg 104 kg 97.6 kg     Physical Exam: General:  No acute distress  Head:  Normocephalic, atraumatic. Moist oral mucosal membranes  Eyes:  Anicteric  Lungs:   Clear to auscultation, normal effort  Heart:  S1S2 no rubs  Abdomen:   Soft, nontender, bowel sounds present  Extremities:  Trace right peripheral edema.1+ left peripheral edema  Neurologic:  Awake, alert, following commands  Skin:  No lesions, sacral wound  Access: RT Permcath    Basic Metabolic Panel: Recent Labs  Lab 01/26/22 0913 01/28/22 1000 01/31/22 0645  NA 134* 136 134*  K 4.2 3.9 3.3*  CL 97* 97* 94*  CO2 25 28 23   GLUCOSE 147* 165* 130*  BUN 58* 50* 41*  CREATININE 5.68* 4.37* 3.03*  CALCIUM 9.0 8.9 9.6  PHOS 4.4 3.5 2.0*     CBC: Recent Labs  Lab 01/24/22 1501 01/24/22 2324 01/25/22 0712 01/26/22 0913 01/28/22 1000 01/31/22 0645  WBC 14.3*  --   --  12.2* 7.5 11.2*  HGB 6.9* 7.1* 8.6* 9.4* 8.3* 9.2*  HCT 22.0* 22.5* 26.8* 29.3* 27.1* 28.5*  MCV 90.9  --   --  86.7 88.3 85.8  PLT 256  --   --  261 265 252      Urinalysis: No results for input(s): "COLORURINE", "LABSPEC", "PHURINE", "GLUCOSEU", "HGBUR", "BILIRUBINUR", "KETONESUR", "PROTEINUR", "UROBILINOGEN", "NITRITE", "LEUKOCYTESUR" in the last 72 hours.  Invalid input(s): "APPERANCEUR"    Imaging: No results found.   Medications:    sodium chloride Stopped (01/19/22 1845)    sodium thiosulfate 25 g in sodium chloride 0.9 % 200 mL Infusion for Calciphylaxis Stopped (01/30/22 1100)    vitamin C  500 mg Oral BID   Chlorhexidine Gluconate Cloth  6 each Topical Q0600   clotrimazole  1 Applicatorful Vaginal QHS   epoetin (EPOGEN/PROCRIT) injection  10,000 Units Intravenous Q M,W,F-HD   fentaNYL  1 patch Transdermal Q72H   fluticasone  2 spray Each Nare Daily   gabapentin  200 mg Oral TID   insulin aspart  0-5 Units Subcutaneous QHS   insulin aspart  0-9 Units Subcutaneous TID WC   insulin aspart  7 Units Subcutaneous TID WC   insulin glargine-yfgn  15 Units Subcutaneous BID   [START ON 02/01/2022] leptospermum manuka honey  1 Application Topical Daily   loratadine  10 mg Oral Daily   metoprolol succinate  25 mg Oral BID   multivitamin  1 tablet Oral QHS   nutrition supplement (JUVEN)  1 packet Oral BID BM   polyethylene glycol  17 g Oral Daily   Ensure Max Protein  11 oz Oral BID   senna-docusate  1 tablet Oral BID    Assessment/ Plan:     Principal Problem:   Severe sepsis (HCC) Active Problems:   OSA (obstructive sleep apnea)   Chronic kidney disease, stage 5 ([place])   Essential  hypertension   DNR (do not resuscitate)/DNI(Do Not Intubate)   Type 2 diabetes mellitus with chronic kidney disease, with long-term current use of insulin (HCC)   Hypomagnesemia   Pressure injury of skin   Symptomatic anemia   Elevated LFTs   Sacral wound, initial encounter   Swelling of left lower extremity   Abdominal pain   AKI (acute kidney injury) ([place])   Hyponatremia   Premature atrial complexes   Anasarca associated with disorder of kidney   Calciphylaxis   ESRD (end stage renal disease) ([place])  66 year old female with history of hypertension, diabetes, peripheral vascular disease, hyperlipidemia, now with sepsis and acute kidney injury on the top of chronic kidney disease.  Patient is reached end-stage renal disease and was started on renal replacement  therapy.   2D echo from January 03, 2022-:LVEF 60 to 65%, no regional wall motion abnormalities, asymmetric left ventricular hypertrophy of the basal septal segment.  Diastolic parameters normal.   #: End-stage renal disease on dialysis:  Patient underwent hemodialysis treatment yesterday.  Tolerated well.  We will plan for hemodialysis treatment again on Monday.   #: Sepsis/fever likely from sacral wound. Biopsy negative for calciphylaxis. Prescribed Meropenem, with plans to hold. Negative pressure wound VAC remains in place and managed by surgery team.  Skin biopsy sent to [facility], negative for calciphylaxis.  ID following and recommends continuation of sodium thiosulfate with dialysis treatments.  This has been ordered.   #: Anemia secondary to chronic kidney disease complicated by sepsis. Has received blood transfusions during this admission.  -Maintain Epogen with dialysis treatments.   #: Secondary hyperparathyroidism:  Phosphorus 2.0 at last check.  Continue to monitor bone metabolism parameters.  # Diabetes with CKD Lab Results  Component Value Date   HGBA1C 10.0 (H) 12/18/2021  Glucose remains elevated at times  Primary team to manage SSI    LOS: [address] [facility] 7/1/202312:11 PM

## 2022-01-31 NOTE — Progress Notes (Signed)
   01/31/22 2100  Mobility  HOB Elevated/Bed Position Self regulated  Activity Turned to right side;Turned to left side  Range of Motion/Exercises Active;Right arm;Left arm  Level of Assistance +2 (takes two people)  Assistive Device BSC;Wheelchair;Front wheel walker  Activity Response Tolerated fair

## 2022-02-01 DIAGNOSIS — A419 Sepsis, unspecified organism: Secondary | ICD-10-CM | POA: Diagnosis not present

## 2022-02-01 DIAGNOSIS — R6520 Severe sepsis without septic shock: Secondary | ICD-10-CM | POA: Diagnosis not present

## 2022-02-01 LAB — GLUCOSE, CAPILLARY
Glucose-Capillary: 113 mg/dL — ABNORMAL HIGH (ref 70–99)
Glucose-Capillary: 263 mg/dL — ABNORMAL HIGH (ref 70–99)
Glucose-Capillary: 187 mg/dL — ABNORMAL HIGH (ref 70–99)
Glucose-Capillary: 76 mg/dL (ref 70–99)

## 2022-02-01 MED ORDER — ORAL CARE MOUTH RINSE: 15.0000 mL | OROMUCOSAL | Status: DC | PRN

## 2022-02-01 NOTE — Progress Notes (Signed)
[facility]  PROGRESS NOTE   Subjective:   Patient seen and evaluated at bedside. Due for hemodialysis treatment again tomorrow. Resting comfortably at the moment.   Objective:  Vital signs: Blood pressure (!) 131/48, pulse 91, temperature 99.2 F (37.3 C), resp. rate 19, height 5\' 3"  (1.6 m), weight 97.4 kg, SpO2 99 %.  Intake/Output Summary (Last 24 hours) at 02/01/2022 1250 Last data filed at 02/01/2022 1020 Gross per 24 hour  Intake 960 ml  Output --  Net 960 ml    Filed Weights   01/28/22 1317 01/30/22 0850 02/01/22 0100  Weight: 104 kg 97.6 kg 97.4 kg     Physical Exam: General:  No acute distress  Head:  Normocephalic, atraumatic. Moist oral mucosal membranes  Eyes:  Anicteric  Lungs:   Clear to auscultation, normal effort  Heart:  S1S2 no rubs  Abdomen:   Soft, nontender, bowel sounds present  Extremities:  Trace right peripheral edema.1+ left peripheral edema  Neurologic:  Awake, alert, following commands  Skin:  Sacral wound  Access:  Right IJ PermCath    Basic Metabolic Panel: Recent Labs  Lab 01/26/22 0913 01/28/22 1000 01/31/22 0645  NA 134* 136 134*  K 4.2 3.9 3.3*  CL 97* 97* 94*  CO2 25 28 23   GLUCOSE 147* 165* 130*  BUN 58* 50* 41*  CREATININE 5.68* 4.37* 3.03*  CALCIUM 9.0 8.9 9.6  PHOS 4.4 3.5 2.0*     CBC: Recent Labs  Lab 01/26/22 0913 01/28/22 1000 01/31/22 0645  WBC 12.2* 7.5 11.2*  HGB 9.4* 8.3* 9.2*  HCT 29.3* 27.1* 28.5*  MCV 86.7 88.3 85.8  PLT 261 265 252      Urinalysis: No results for input(s): "COLORURINE", "LABSPEC", "PHURINE", "GLUCOSEU", "HGBUR", "BILIRUBINUR", "KETONESUR", "PROTEINUR", "UROBILINOGEN", "NITRITE", "LEUKOCYTESUR" in the last 72 hours.  Invalid input(s): "APPERANCEUR"    Imaging: No results found.   Medications:    sodium chloride Stopped (01/19/22 1845)   sodium thiosulfate 25 g in sodium chloride 0.9 % 200 mL Infusion for Calciphylaxis Stopped (01/30/22 1100)     vitamin C  500 mg Oral BID   Chlorhexidine Gluconate Cloth  6 each Topical Q0600   epoetin (EPOGEN/PROCRIT) injection  10,000 Units Intravenous Q M,W,F-HD   fentaNYL  1 patch Transdermal Q72H   fluticasone  2 spray Each Nare Daily   gabapentin  200 mg Oral TID   insulin aspart  0-5 Units Subcutaneous QHS   insulin aspart  0-9 Units Subcutaneous TID WC   insulin aspart  7 Units Subcutaneous TID WC   insulin glargine-yfgn  15 Units Subcutaneous BID   leptospermum manuka honey  1 Application Topical Daily   loratadine  10 mg Oral Daily   metoprolol succinate  25 mg Oral BID   multivitamin  1 tablet Oral QHS   nutrition supplement (JUVEN)  1 packet Oral BID BM   polyethylene glycol  17 g Oral Daily   Ensure Max Protein  11 oz Oral BID   senna-docusate  1 tablet Oral BID    Assessment/ Plan:     Principal Problem:   Severe sepsis (HCC) Active Problems:   OSA (obstructive sleep apnea)   Chronic kidney disease, stage 5 ([place])   Essential hypertension   DNR (do not resuscitate)/DNI(Do Not Intubate)   Type 2 diabetes mellitus with chronic kidney disease, with long-term current use of insulin (HCC)   Hypomagnesemia   Pressure injury of skin   Symptomatic anemia   Elevated LFTs  Sacral wound, initial encounter   Swelling of left lower extremity   Abdominal pain   AKI (acute kidney injury) ([place])   Hyponatremia   Premature atrial complexes   Anasarca associated with disorder of kidney   Calciphylaxis   ESRD (end stage renal disease) ([place])  66 year old female with history of hypertension, diabetes, peripheral vascular disease, hyperlipidemia, now with sepsis and acute kidney injury on the top of chronic kidney disease.  Patient is reached end-stage renal disease and was started on renal replacement therapy.   2D echo from January 03, 2022-:LVEF 60 to 65%, no regional wall motion abnormalities, asymmetric left ventricular hypertrophy of the basal septal segment.  Diastolic parameters  normal.   #: End-stage renal disease on dialysis:  Patient will be due for hemodialysis treatment again tomorrow.   #: Sepsis/fever likely from sacral wound. Biopsy negative for calciphylaxis. Prescribed Meropenem, with plans to hold. Negative pressure wound VAC remains in place and managed by surgery team.  Skin biopsy sent to [facility], negative for calciphylaxis.  ID following and recommends continuation of sodium thiosulfate with dialysis treatments.  Maintain the patient on this as outpatient.   #: Anemia secondary to chronic kidney disease complicated by sepsis. Has received blood transfusions during this admission.  Lab Results  Component Value Date   HGB 9.2 (L) 01/31/2022  Continue Epogen 10,000 units IV with dialysis treatments.    #: Secondary hyperparathyroidism:  Phosphorus 2.0 at last check.  Continue to monitor bone metabolism parameters.  # Diabetes with CKD Lab Results  Component Value Date   HGBA1C 10.0 (H) 12/18/2021  Glucose remains elevated at times  Primary team to manage SSI    LOS: 70 Najee Manninen [facility] 7/2/202312:50 PM

## 2022-02-01 NOTE — Progress Notes (Signed)
Pt refusing wound care at this time, pt states "they just changed my dressing" Pt educated that last dressing change was 01/31/22 and the dressing should be changed daily. Pt then tearfully states "you all just want to keep tearing me apart by doing dressing changes." Pt continues to refuse dressing changes.

## 2022-02-01 NOTE — Progress Notes (Signed)
[facility] [place] NAME: Theresa Warren    MR#:  [id number]  DATE OF BIRTH:  1956-02-23  SUBJECTIVE:   With friend Theresa Warren in the room In good spirits VITALS:  Blood pressure (!) 131/48, pulse 91, temperature 99.2 F (37.3 C), resp. rate 19, height 5\' 3"  (1.6 m), weight 97.4 kg, SpO2 99 %.  PHYSICAL EXAMINATION:   GENERAL:  66 y.o.-year-old patient lying in the bed  Morbidly obese appears chronically ill and deconditioned LUNGS: Normal breath sounds bilaterally, no wheezing, rales, rhonchi.  CARDIOVASCULAR: S1, S2 normal. No murmurs.  ABDOMEN: Soft, nontender, nondistended. Abdominal obesity NEUROLOGIC: nonfocal  patient is alert and awake SKIN: Areas of central infarction with surrounding painful induration over the upper thighs, inner thigs , left calf  Wounds as of 01/31/22               LABORATORY PANEL:  CBC Recent Labs  Lab 01/31/22 0645  WBC 11.2*  HGB 9.2*  HCT 28.5*  PLT 252     Chemistries  Recent Labs  Lab 01/31/22 0645  NA 134*  K 3.3*  CL 94*  CO2 23  GLUCOSE 130*  BUN 41*  CREATININE 3.03*  CALCIUM 9.6     Assessment and Plan Theresa Warren is a 66 year old female hyperlipidemia, hypertension, CKD stage V, insulin-dependent diabetes mellitus, who presents emergency department from [facility] for evaluation of low sodium noted on labs at the facility.  She was discharged to rehab on 5/23 after being admitted following a fall and also with acute on chronic anemia.  Patient has been here for last 27 days. Please review detailed progress note in [facility] if needed.  Severe sepsis present on admission Significant wound over the buttock and bilateral thighs Painful necrotic lesions -- sepsis improved -- wound appears necrotic concern for calciphylaxis (although bx neg for it)--however curb sided Dermatology and they think this is favoring calciphylaxis -- vasculitis workup so far has been  negative -- ID Dr. Steva Ready has messaged dermatology to discuss and give curbside consultation -- currently off antibiotic --continue aggressive wound care --discussed pain management--for now cont fentanyl patch +oxycodone. Moving forward increased fentanyl dose and decrease oxycodone dose to get better pain control. Pt agreeable. --Wound pictures as off 01/31/2022 taken  Bilateral sacral wounds -- status post debridement with surgery -- continue dressing changes as per instructions  End-stage renal disease on hemodialysis -- patient will need outpatient hemodialysis-- dialysis coordinator working on and -- She is having a very hard time sitting up for dialysis given her morbid obesity significant pain from her skin lesions -- patient sat in the recliner at dialysis. Will continue to do the same  generalized weakness, deconditioning -- PT to see as and when able to -- recommends rehab however patient is adamant to go home.  -- DME ordered  anemia of chronic disease in the setting of ESRD -- status post two unit blood transfusion -- no evidence of any active bleeding. -- No history of vaginally bleeding. 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 open area right below coccyx (Active)  12/20/21 0800  Location: Buttocks  Location Orientation: Left  Staging: Stage 4 - Full thickness tissue loss with exposed bone, tendon or muscle.  Wound Description (Comments): open area right below coccyx  Present on Admission: Yes (First day with patient. Unknown if present at admission.)     Pressure  Injury 01/01/22 Buttocks Left Stage 3 -  Full thickness tissue loss. Subcutaneous fat may be visible but bone, tendon or muscle are NOT exposed. (Active)  01/01/22 0222  Location: Buttocks  Location Orientation: Left  Staging: Stage 3 -  Full thickness tissue loss. Subcutaneous fat may be visible but bone, tendon or muscle are NOT exposed.  Wound Description (Comments):   Present on Admission: Yes     Pressure Injury 01/01/22 Buttocks Right Stage 4 - Full thickness tissue loss with exposed bone, tendon or muscle. (Active)  01/01/22 0223  Location: Buttocks  Location Orientation: Right  Staging: Stage 4 - Full thickness tissue loss with exposed bone, tendon or muscle.  Wound Description (Comments):   Present on Admission: Yes     Pressure Injury 01/01/22 Hip Left Stage 4 - Full thickness tissue loss with exposed bone, tendon or muscle. (Active)  01/01/22 0225  Location: Hip  Location Orientation: Left  Staging: Stage 4 - Full thickness tissue loss with exposed bone, tendon or muscle.  Wound Description (Comments):   Present on Admission: Yes     Pressure Injury 01/01/22 Hip Right Stage 3 -  Full thickness tissue loss. Subcutaneous fat may be visible but bone, tendon or muscle are NOT exposed. (Active)  01/01/22 0225  Location: Hip  Location Orientation: Right  Staging: Stage 3 -  Full thickness tissue loss. Subcutaneous fat may be visible but bone, tendon or muscle are NOT exposed.  Wound Description (Comments):   Present on Admission: Yes     Pressure Injury 01/12/22 Thigh Left;Upper Unstageable - Full thickness tissue loss in which the base of the injury is covered by slough (yellow, tan, gray, green or brown) and/or eschar (tan, brown or black) in the wound bed. previously noted "rugburns"  (Active)  01/12/22   Location: Thigh  Location Orientation: Left;Upper  Staging: Unstageable - Full thickness tissue loss in which the base of the injury is covered by slough (yellow, tan,  gray, green or brown) and/or eschar (tan, brown or black) in the wound bed.  Wound Description (Comments): previously noted "rugburns" have evolved  Present on Admission: Yes     Pressure Injury 01/12/22 Thigh Left;Posterior Deep Tissue Pressure Injury - Purple or maroon localized area of discolored intact skin or blood-filled blister due to damage of underlying soft tissue from pressure and/or shear. previously noted " rugburns" (Active)  01/12/22   Location: Thigh  Location Orientation: Left;Posterior  Staging: Deep Tissue Pressure Injury - Purple or maroon localized area of discolored intact skin or blood-filled blister due to damage of underlying soft tissue from pressure and/or shear.  Wound Description (Comments): previously noted " rugburns" have evolved  Present on Admission: Yes    Overall poor prognosis  Family communication : friend Theresa Warren on the phone  96/29) Consults : ID, nephrology CODE STATUS: DNR DVT Prophylaxis : due to severe necrotic skin lesions (?heparin) unable to give it. Level of care: Med-Surg Status is: Inpatient Remains inpatient appropriate because: multiple medical comorbdities, awaiting Out pt HD, pain control for skin lesions      Anticipate discharge early next week wants outpatient dialysis chair time is established. Patient and her friend Theresa Warren is aware.  TOTAL  TIME TAKING CARE OF THIS PATIENT: 35 minutes.  >50% time spent on counselling and coordination of care  Note: This dictation was prepared with Dragon dictation along with smaller phrase technology. Any transcriptional errors that result from this process are unintentional.  Fritzi Mandes M.D    [facility]   CC: Primary care physician; Crist Infante, MD

## 2022-02-02 DIAGNOSIS — T07XXXA Unspecified multiple injuries, initial encounter: Secondary | ICD-10-CM | POA: Diagnosis not present

## 2022-02-02 DIAGNOSIS — N186 End stage renal disease: Secondary | ICD-10-CM | POA: Diagnosis not present

## 2022-02-02 DIAGNOSIS — S31000A Unspecified open wound of lower back and pelvis without penetration into retroperitoneum, initial encounter: Secondary | ICD-10-CM | POA: Diagnosis not present

## 2022-02-02 LAB — CBC
HCT: 26.1 % — ABNORMAL LOW (ref 36.0–46.0)
Hemoglobin: 8.2 g/dL — ABNORMAL LOW (ref 12.0–15.0)
MCH: 27.4 pg (ref 26.0–34.0)
MCHC: 31.4 g/dL (ref 30.0–36.0)
MCV: 87.3 fL (ref 80.0–100.0)
Platelets: 269 K/uL (ref 150–400)
RBC: 2.99 MIL/uL — ABNORMAL LOW (ref 3.87–5.11)
RDW: 16.2 % — ABNORMAL HIGH (ref 11.5–15.5)
WBC: 10.6 K/uL — ABNORMAL HIGH (ref 4.0–10.5)
nRBC: 0 % (ref 0.0–0.2)

## 2022-02-02 LAB — RENAL FUNCTION PANEL
Albumin: 1.9 g/dL — ABNORMAL LOW (ref 3.5–5.0)
Anion gap: 16 — ABNORMAL HIGH (ref 5–15)
BUN: 67 mg/dL — ABNORMAL HIGH (ref 8–23)
CO2: 22 mmol/L (ref 22–32)
Calcium: 9.3 mg/dL (ref 8.9–10.3)
Chloride: 94 mmol/L — ABNORMAL LOW (ref 98–111)
Creatinine, Ser: 4.74 mg/dL — ABNORMAL HIGH (ref 0.44–1.00)
GFR, Estimated: 10 mL/min — ABNORMAL LOW (ref >60)
Glucose, Bld: 167 mg/dL — ABNORMAL HIGH (ref 70–99)
Phosphorus: 3.8 mg/dL (ref 2.5–4.6)
Potassium: 4 mmol/L (ref 3.5–5.1)
Sodium: 132 mmol/L — ABNORMAL LOW (ref 135–145)

## 2022-02-02 LAB — GLUCOSE, CAPILLARY
Glucose-Capillary: 141 mg/dL — ABNORMAL HIGH (ref 70–99)
Glucose-Capillary: 100 mg/dL — ABNORMAL HIGH (ref 70–99)
Glucose-Capillary: 108 mg/dL — ABNORMAL HIGH (ref 70–99)

## 2022-02-02 MED ORDER — EPOETIN ALFA 10000 UNIT/ML IJ SOLN
INTRAMUSCULAR | Status: AC
  Filled 2022-02-02: qty 1

## 2022-02-02 MED ORDER — HEPARIN SODIUM (PORCINE) 1000 UNIT/ML IJ SOLN
INTRAMUSCULAR | Status: AC
  Filled 2022-02-02: qty 10

## 2022-02-02 MED ORDER — BISACODYL 10 MG RE SUPP: 10.0000 mg | Freq: Once | RECTAL | Status: DC

## 2022-02-02 MED ORDER — POTASSIUM CHLORIDE CRYS ER 20 MEQ PO TBCR
20.0000 meq | EXTENDED_RELEASE_TABLET | Freq: Once | ORAL | Status: AC
  Administered 2022-02-02: 20 meq via ORAL
  Filled 2022-02-02: qty 1

## 2022-02-02 MED ORDER — ACETAMINOPHEN 325 MG PO TABS
ORAL_TABLET | ORAL | Status: AC
  Filled 2022-02-02: qty 2

## 2022-02-02 NOTE — Progress Notes (Signed)
Occupational Therapy Treatment Patient Details Name: Theresa Warren MRN: [id number] DOB: January 13, 1956 Today's Date: 02/02/2022   History of present illness [name] is a 66 year old female admitted for evaluation of low sodium noted on labs at the facility.  Pt was discharged  to rehab on 5/23 after being admitted following a fall. PMH significant for acute on chronic anemia. hyperlipidemia, hypertension, CKD stage V, insulin-dependent diabetes mellitus.   OT comments  Pt seen for OT tx this afternoon, cleared by RN to work with pt. Initially attempting to co-tx with PT for safety to address ADL transfers. However, pt declined despite education and encouragement but agreeable to bed level exercises. Pt instructed in BUE exercises including alternating forward punches and crossing midline to tap bed rails using opposite arms to also work trunk rotation and core strength, x10 each arm. Also educated in importance of pressure relief/ oob mobility. Pt continues to benefit from skilled OT services and currently unable to care for herself at home. SNF remains most appropriate discharge recommendation.    Recommendations for follow up therapy are one component of a multi-disciplinary discharge planning process, led by the attending physician.  Recommendations may be updated based on patient status, additional functional criteria and insurance authorization.    Follow Up Recommendations  Skilled nursing-short term rehab (<3 hours/day)    Assistance Recommended at Discharge Frequent or constant Supervision/Assistance  Patient can return home with the following  A lot of help with walking and/or transfers;A lot of help with bathing/dressing/bathroom;Assist for transportation;Help with stairs or ramp for entrance   Equipment Recommendations  Wheelchair cushion (measurements OT);Wheelchair (measurements OT);BSC/3in1;Hospital bed    Recommendations for Other Services      Precautions /  Restrictions Precautions Precautions: Fall Precaution Comments: sacral & leg wounds Restrictions Weight Bearing Restrictions: No       Mobility Bed Mobility               General bed mobility comments: Pt declined despite encouragement for repositioning    Transfers                         Balance                                           ADL either performed or assessed with clinical judgement   ADL                                              Extremity/Trunk Assessment              Vision       Perception     Praxis      Cognition   Behavior During Therapy: Flat affect, Anxious Overall Cognitive Status: No family/caregiver present to determine baseline cognitive functioning Area of Impairment: Attention, Following commands, Awareness, Safety/judgement, Problem solving                   Current Attention Level: Selective   Following Commands: Follows one step commands with increased time, Follows one step commands inconsistently Safety/Judgement: Decreased awareness of safety, Decreased awareness of deficits Awareness: Anticipatory Problem Solving: Slow processing, Decreased initiation, Difficulty sequencing, Requires verbal cues, Requires tactile cues  Exercises Other Exercises Other Exercises: Pt instructed in BUE exercises including alternating forward punches and crossing midline to tap bed rails using opposite arms to also work trunk rotation and core strength, x10 each arm. Also educated in importance of pressure relief/ oob mobility    Shoulder Instructions       General Comments      Pertinent Vitals/ Pain       Pain Assessment Pain Assessment: Faces Faces Pain Scale: Hurts a little bit Pain Descriptors / Indicators: Grimacing, Guarding Pain Intervention(s): Monitored during session, Limited activity within patient's tolerance, Premedicated before session  Home Living                                           Prior Functioning/Environment              Frequency  Min 2X/week        Progress Toward Goals  OT Goals(current goals can now be found in the care plan section)  Progress towards OT goals: OT to reassess next treatment  Acute Rehab OT Goals Patient Stated Goal: go home OT Goal Formulation: With patient Time For Goal Achievement: 02/12/22 Potential to Achieve Goals: [place] Discharge plan remains appropriate;Frequency remains appropriate    Co-evaluation                 AM-PAC OT "6 Clicks" Daily Activity     Outcome Measure   Help from another person eating meals?: None Help from another person taking care of personal grooming?: A Little Help from another person toileting, which includes using toliet, bedpan, or urinal?: A Lot Help from another person bathing (including washing, rinsing, drying)?: A Lot Help from another person to put on and taking off regular upper body clothing?: A Little Help from another person to put on and taking off regular lower body clothing?: A Lot 6 Click Score: 16    End of Session    OT Visit Diagnosis: Other abnormalities of gait and mobility (R26.89);Muscle weakness (generalized) (M62.81)   Activity Tolerance Patient limited by pain;Other (comment) (self limiting)   Patient Left in bed;with call bell/phone within reach;with bed alarm set   Nurse Communication          Time: [phone] OT Time Calculation (min): 15 min  Charges: OT General Charges $OT Visit: 1 Visit OT Treatments $Therapeutic Exercise: 8-22 mins  Ardeth Perfect., MPH, MS, OTR/L ascom [phone] 02/02/22, 4:47 PM

## 2022-02-02 NOTE — Progress Notes (Signed)
Enetered pt room to adminster medications and notify pt that she was going to dialysis. Pt stated " do I have to go?" I stated " its highly recommended. Pt then stated " I don't want to go and I don't want to get in dialysis chair" I educated her on the risks and benefits of this choice.Pt then agreed to go in dialysis chair. Approx 0830 me and the Elberta Fortis, [place] attempted to get her in the dialysis chair. She sat up on the side of be Independently and prepared herself to stand with the walker. Me and Elberta Fortis got on both sides of pt to assist her with standing. Pt on the count of 3 stood up with very minimal assistance from staff. Pt stood for a few seconds and stated that she felt fine while standing. Pt took about 2-3 steps and then slid to the floor while letting go of the walker. With me and the NA still standing beside her. Pt did not attempt to use upper body strengthen to stand while holding the walker. As soon as pt was guided to floor pt looks at me and states " SO, now what I don't have to go to dialysis?" I stated no you still need to go to dialysis bc it is important. I then assessed pt for injuries and pain. 0/10 pain scale and no injuries noted.  While Elberta Fortis left out of room to go get help pt then states " Dang yawl are not going to ever let me go home with all these falls" , I then  asked pt would she allow me to get V/S and she stated no bc they had already obtained them this morning. I explained ot pt the v/s protocol pretaining to falls and she still responded " NO" .Multiple staff member came to assist her get of the floor.One pt was situated int he dialysis chair I asked pt was she okay and she stated "Yes" she was not in any pain. I then asked he what happened and she stated her Left leg is unable to move the way she wants it to and it has always been like that. I the asked her to left her leg up and kick it out. Pt followed my command and had strong resistance and mobility in both legs.  Transport assisted pt to dialysis for treatment

## 2022-02-02 NOTE — Progress Notes (Signed)
[facility]  PROGRESS NOTE   Subjective:   Patient seen and evaluated during dialysis   HEMODIALYSIS FLOWSHEET:  Blood Flow Rate (mL/min): 400 mL/min Arterial Pressure (mmHg): -160 mmHg Venous Pressure (mmHg): 120 mmHg Transmembrane Pressure (mmHg): 40 mmHg Ultrafiltration Rate (mL/min): 500 mL/min Dialysate Flow Rate (mL/min): 600 ml/min Conductivity: 13.9 Conductivity: 13.9 Dialysis Fluid Bolus: Normal Saline Bolus Amount (mL): 250 mL  Patient tolerating treatment well seated in chair, no complaints at this time.   Objective:  Vital signs: Blood pressure 128/61, pulse 77, temperature 98.7 F (37.1 C), temperature source Oral, resp. rate 13, height 5\' 3"  (1.6 m), weight 97.4 kg, SpO2 99 %.  Intake/Output Summary (Last 24 hours) at 02/02/2022 1151 Last data filed at 02/01/2022 2000 Gross per 24 hour  Intake --  Output 200 ml  Net -200 ml    Filed Weights   01/28/22 1317 01/30/22 0850 02/01/22 0100  Weight: 104 kg 97.6 kg 97.4 kg     Physical Exam: General:  No acute distress  Head:  Normocephalic, atraumatic. Moist oral mucosal membranes  Eyes:  Anicteric  Lungs:   Clear to auscultation, normal effort  Heart:  S1S2 no rubs  Abdomen:   Soft, nontender, bowel sounds present  Extremities:  Trace right peripheral edema.1+ left peripheral edema  Neurologic:  Awake, alert, following commands  Skin:  Sacral wounds  Access:  Right IJ PermCath    Basic Metabolic Panel: Recent Labs  Lab 01/28/22 1000 01/31/22 0645  NA 136 134*  K 3.9 3.3*  CL 97* 94*  CO2 28 23  GLUCOSE 165* 130*  BUN 50* 41*  CREATININE 4.37* 3.03*  CALCIUM 8.9 9.6  PHOS 3.5 2.0*     CBC: Recent Labs  Lab 01/28/22 1000 01/31/22 0645 02/02/22 1045  WBC 7.5 11.2* 10.6*  HGB 8.3* 9.2* 8.2*  HCT 27.1* 28.5* 26.1*  MCV 88.3 85.8 87.3  PLT 265 252 269      Urinalysis: No results for input(s): "COLORURINE", "LABSPEC", "PHURINE", "GLUCOSEU", "HGBUR", "BILIRUBINUR",  "KETONESUR", "PROTEINUR", "UROBILINOGEN", "NITRITE", "LEUKOCYTESUR" in the last 72 hours.  Invalid input(s): "APPERANCEUR"    Imaging: No results found.   Medications:    sodium chloride Stopped (01/19/22 1845)   sodium thiosulfate 25 g in sodium chloride 0.9 % 200 mL Infusion for Calciphylaxis Stopped (01/30/22 1100)    acetaminophen       vitamin C  500 mg Oral BID   bisacodyl  10 mg Rectal Once   Chlorhexidine Gluconate Cloth  6 each Topical Q0600   epoetin alfa       epoetin (EPOGEN/PROCRIT) injection  10,000 Units Intravenous Q M,W,F-HD   fentaNYL  1 patch Transdermal Q72H   fluticasone  2 spray Each Nare Daily   gabapentin  200 mg Oral TID   heparin sodium (porcine)       insulin aspart  0-5 Units Subcutaneous QHS   insulin aspart  0-9 Units Subcutaneous TID WC   insulin aspart  7 Units Subcutaneous TID WC   insulin glargine-yfgn  15 Units Subcutaneous BID   leptospermum manuka honey  1 Application Topical Daily   loratadine  10 mg Oral Daily   metoprolol succinate  25 mg Oral BID   multivitamin  1 tablet Oral QHS   nutrition supplement (JUVEN)  1 packet Oral BID BM   polyethylene glycol  17 g Oral Daily   potassium chloride  20 mEq Oral Once   Ensure Max Protein  11 oz Oral BID  senna-docusate  1 tablet Oral BID    Assessment/ Plan:     Principal Problem:   Severe sepsis ([place]) Active Problems:   OSA (obstructive sleep apnea)   Chronic kidney disease, stage 5 (HCC)   Essential hypertension   DNR (do not resuscitate)/DNI(Do Not Intubate)   Type 2 diabetes mellitus with chronic kidney disease, with long-term current use of insulin (HCC)   Hypomagnesemia   Pressure injury of skin   Symptomatic anemia   Elevated LFTs   Sacral wound, initial encounter   Swelling of left lower extremity   Abdominal pain   AKI (acute kidney injury) ([place])   Hyponatremia   Premature atrial complexes   Anasarca associated with disorder of kidney   Calciphylaxis   ESRD (end  stage renal disease) ([place])  66 year old female with history of hypertension, diabetes, peripheral vascular disease, hyperlipidemia, now with sepsis and acute kidney injury on the top of chronic kidney disease.  Patient is reached end-stage renal disease and was started on renal replacement therapy.   2D echo from January 03, 2022-:LVEF 60 to 65%, no regional wall motion abnormalities, asymmetric left ventricular hypertrophy of the basal septal segment.  Diastolic parameters normal.   #: End-stage renal disease on dialysis:  Receiving scheduled treatment today, UF goal 1 L as tolerated.  Next treatment scheduled for Wednesday.  Previous outpatient clinic search and acceptance has expired.  Renal navigator in process of resubmitting.   #: Sepsis/fever likely from sacral wound. Biopsy negative for calciphylaxis. Prescribed Meropenem, with plans to hold. Negative pressure wound VAC remains in place and managed by surgery team.  We will continue sodium thiosulfate with dialysis treatments.   #: Anemia secondary to chronic kidney disease complicated by sepsis. Has received blood transfusions during this admission.  Lab Results  Component Value Date   HGB 8.2 (L) 02/02/2022  Hemoglobin remains below desired target.  Continue Epogen 10,000 units IV with dialysis treatments.    #: Secondary hyperparathyroidism:  Awaiting updated labs.  # Diabetes with CKD Lab Results  Component Value Date   HGBA1C 10.0 (H) 12/18/2021  Improved glucose control.  Primary team to manage SSI    LOS: [facility] 7/3/202311:51 AM

## 2022-02-02 NOTE — Progress Notes (Addendum)
PT Cancellation Note  Patient Details Name: Theresa Warren MRN: [id number] DOB: [birth year]   Cancelled Treatment:    Reason Eval/Treat Not Completed: Other (comment).  Chart reviewed.  Upon therapy arrival to pt's room, pt sitting on floor with nursing staff present assisting pt.  Will re-attempt PT session at a later date/time.  Leitha Bleak, PT 02/02/22, 9:33 AM  Addendum:  Pt still at dialysis during afternoon therapy attempt.  Will re-attempt PT session at a later date/time.  Leitha Bleak, PT 02/02/22, 1:39 PM

## 2022-02-02 NOTE — Progress Notes (Signed)
Dressings changed per order.

## 2022-02-02 NOTE — Progress Notes (Signed)
Physical Therapy Treatment Patient Details Name: Theresa Warren MRN: [id number] DOB: Jan 16, 1956 Today's Date: 02/02/2022   History of Present Illness [name] is a 66 year old female admitted for evaluation of low sodium noted on labs at the facility.  Pt was discharged  to rehab on 5/23 after being admitted following a fall. PMH significant for acute on chronic anemia. hyperlipidemia, hypertension, CKD stage V, insulin-dependent diabetes mellitus.    PT Comments    Attempted PT/OT co-treatment for OOB mobility but pt firmly refusing any OOB mobility with max encouragement but agreeable to in bed ex's.  Pt reporting fall earlier today.  After OT finished session, pt agreeable to LE ex's in bed.  Pt tolerated LE ex's fairly well with assist as needed.  Will continue to focus on strengthening and progressive functional mobility per pt tolerance.    Recommendations for follow up therapy are one component of a multi-disciplinary discharge planning process, led by the attending physician.  Recommendations may be updated based on patient status, additional functional criteria and insurance authorization.  Follow Up Recommendations  Skilled nursing-short term rehab (<3 hours/day) Can patient physically be transported by private vehicle: No   Assistance Recommended at Discharge Frequent or constant Supervision/Assistance  Patient can return home with the following Two people to help with walking and/or transfers;Two people to help with bathing/dressing/bathroom;Assistance with cooking/housework;Assist for transportation;Help with stairs or ramp for entrance   Equipment Recommendations  Rolling walker (2 wheels);BSC/3in1;Wheelchair cushion (measurements PT);Wheelchair (measurements PT);Hospital bed    Recommendations for Other Services       Precautions / Restrictions Precautions Precautions: Fall Precaution Comments: sacral & leg wounds Restrictions Weight Bearing  Restrictions: No     Mobility  Bed Mobility Overal bed mobility:  (pt refused any OOB mobility today)                  Transfers                        Ambulation/Gait                   Stairs             Wheelchair Mobility    Modified Rankin (Stroke Patients Only)       Balance                                            Cognition Arousal/Alertness: Awake/alert Behavior During Therapy: Flat affect, Anxious Overall Cognitive Status: No family/caregiver present to determine baseline cognitive functioning Area of Impairment: Attention, Following commands, Awareness, Safety/judgement, Problem solving                   Current Attention Level: Selective   Following Commands: Follows one step commands with increased time, Follows one step commands inconsistently Safety/Judgement: Decreased awareness of safety, Decreased awareness of deficits Awareness: Anticipatory Problem Solving: Slow processing, Decreased initiation, Difficulty sequencing, Requires verbal cues, Requires tactile cues          Exercises Total Joint Exercises Ankle Circles/Pumps: AROM, Strengthening, Both, 10 reps, Supine Quad Sets: AROM, Strengthening, Both, 10 reps, Supine Short Arc Quad: AROM, Strengthening, Both, 10 reps, Supine Heel Slides: AAROM, Strengthening, Both, 10 reps, Supine Hip ABduction/ADduction: AAROM, Strengthening, Both, 10 reps, Supine Straight Leg Raises: AAROM, Strengthening, Both, 10 reps, Supine    General Comments  Nursing cleared pt for participation in physical therapy.  Pt agreeable to limited PT session.      Pertinent Vitals/Pain Pain Assessment Pain Assessment: Faces Faces Pain Scale: Hurts little more Pain Location: pt's bottom and B LE's Pain Descriptors / Indicators: Sore, Aching Pain Intervention(s): Limited activity within patient's tolerance, Monitored during session, Premedicated before session,  Repositioned (utilized pillows to reposition pt in bed) Vitals (HR and O2 on room air) stable and WFL throughout treatment session.    Home Living                          Prior Function            PT Goals (current goals can now be found in the care plan section) Acute Rehab PT Goals Patient Stated Goal: to improve strength and mobility PT Goal Formulation: With patient Time For Goal Achievement: 02/03/22 Potential to Achieve Goals: Fair Progress towards PT goals: Progressing toward goals    Frequency    Min 2X/week      PT Plan Current plan remains appropriate    Co-evaluation              AM-PAC PT "6 Clicks" Mobility   Outcome Measure  Help needed turning from your back to your side while in a flat bed without using bedrails?: A Little Help needed moving from lying on your back to sitting on the side of a flat bed without using bedrails?: A Lot Help needed moving to and from a bed to a chair (including a wheelchair)?: A Lot Help needed standing up from a chair using your arms (e.g., wheelchair or bedside chair)?: A Lot Help needed to walk in hospital room?: Total Help needed climbing 3-5 steps with a railing? : Total 6 Click Score: 11    End of Session   Activity Tolerance: Patient limited by fatigue (pt reporting feeling drowsy d/t medication) Patient left: in bed;with call bell/phone within reach;with bed alarm set;Other (comment) (B heels floating via pillow support) Nurse Communication: Mobility status;Precautions PT Visit Diagnosis: Unsteadiness on feet (R26.81);Muscle weakness (generalized) (M62.81);Difficulty in walking, not elsewhere classified (R26.2)     Time: 7371-0626 PT Time Calculation (min) (ACUTE ONLY): 25 min  Charges:  $Therapeutic Exercise: 23-37 mins                     Leitha Bleak, PT 02/02/22, 4:38 PM

## 2022-02-02 NOTE — Progress Notes (Signed)
OT Cancellation Note  Patient Details Name: Romey Cohea MRN: [id number] DOB: 08-25-1955   Cancelled Treatment:    Reason Eval/Treat Not Completed: Other (comment). Per PT, pt sitting on the floor with nursing staff present assisting pt. Will re-attempt OT tx at later date/time as pt is available and appropriate.   Ardeth Perfect., MPH, MS, OTR/L ascom [phone] 02/02/22, 9:33 AM

## 2022-02-03 DIAGNOSIS — Z7189 Other specified counseling: Secondary | ICD-10-CM | POA: Diagnosis not present

## 2022-02-03 DIAGNOSIS — A419 Sepsis, unspecified organism: Secondary | ICD-10-CM | POA: Diagnosis not present

## 2022-02-03 DIAGNOSIS — R6520 Severe sepsis without septic shock: Secondary | ICD-10-CM | POA: Diagnosis not present

## 2022-02-03 LAB — GLUCOSE, CAPILLARY
Glucose-Capillary: 89 mg/dL (ref 70–99)
Glucose-Capillary: 195 mg/dL — ABNORMAL HIGH (ref 70–99)
Glucose-Capillary: 241 mg/dL — ABNORMAL HIGH (ref 70–99)
Glucose-Capillary: 202 mg/dL — ABNORMAL HIGH (ref 70–99)

## 2022-02-03 NOTE — Progress Notes (Signed)
Daily Progress Note   Patient Name: Theresa Warren       Date: 02/03/2022 DOB: 1956-06-05  Age: 66 y.o. MRN#: [id number] Attending Physician: Fritzi Mandes, MD Primary Care Physician: Crist Infante, MD Admit Date: 12/31/2021  Reason for Consultation/Follow-up: Establishing goals of care and Pain control  Subjective: Notes and labs reviewed.  In to see patient. No family at bedside. She is sitting in bedside chair. She discusses difficulty with moving. She discusses the assistance she requires. She discusses pain and that after Oxycodone it is 6/10. Before Oxycodone when she feels she needs a dose, the pain is 8/10. She again discusses concerns with Oxycodone dosing and confusion. Discussed her renal failure and Fentanyl.   Discussed facility placement and safety. She discusses that chair dialysis is improving.   Length of Stay: 33  Current Medications: Scheduled Meds:   vitamin C  500 mg Oral BID   bisacodyl  10 mg Rectal Once   Chlorhexidine Gluconate Cloth  6 each Topical Q0600   epoetin (EPOGEN/PROCRIT) injection  10,000 Units Intravenous Q M,W,F-HD   fentaNYL  1 patch Transdermal Q72H   fluticasone  2 spray Each Nare Daily   gabapentin  200 mg Oral TID   insulin aspart  0-5 Units Subcutaneous QHS   insulin aspart  0-9 Units Subcutaneous TID WC   insulin glargine-yfgn  15 Units Subcutaneous BID   leptospermum manuka honey  1 Application Topical Daily   loratadine  10 mg Oral Daily   metoprolol succinate  25 mg Oral BID   multivitamin  1 tablet Oral QHS   nutrition supplement (JUVEN)  1 packet Oral BID BM   polyethylene glycol  17 g Oral Daily   Ensure Max Protein  11 oz Oral BID   senna-docusate  1 tablet Oral BID    Continuous Infusions:  sodium chloride Stopped  (01/19/22 1845)   sodium thiosulfate 25 g in sodium chloride 0.9 % 200 mL Infusion for Calciphylaxis Stopped (02/02/22 1414)    PRN Meds: sodium chloride, acetaminophen, alteplase, heparin, lidocaine-prilocaine, Muscle Rub, ondansetron **OR** ondansetron (ZOFRAN) IV, mouth rinse, oxyCODONE, pentafluoroprop-tetrafluoroeth  Physical Exam Pulmonary:     Effort: Pulmonary effort is normal.  Neurological:     Mental Status: She is alert.     Comments: Oriented.  Vital Signs: BP 135/70 (BP Location: Right Arm)   Pulse 95   Temp 98.7 F (37.1 C)   Resp 19   Ht 5\' 3"  (1.6 m)   Wt 97.4 kg   SpO2 97%   BMI 38.04 kg/m  SpO2: SpO2: 97 % O2 Device: O2 Device: Room Air O2 Flow Rate: O2 Flow Rate (L/min): 3 L/min  Intake/output summary:  Intake/Output Summary (Last 24 hours) at 02/03/2022 0953 Last data filed at 02/03/2022 9147 Gross per 24 hour  Intake --  Output 1109 ml  Net -1109 ml   LBM: Last BM Date : 01/23/22 Baseline Weight: Weight: 97 kg Most recent weight: Weight:  (unable to assess due to safety concerns.)       Palliative Assessment/Data: 50%      Patient Active Problem List   Diagnosis Date Noted   ESRD (end stage renal disease) ([place])    Calciphylaxis    Anasarca associated with disorder of kidney 01/05/2022   Premature atrial complexes 01/02/2022   Sacral wound, initial encounter 01/01/2022   Swelling of left lower extremity 01/01/2022   Abdominal pain 01/01/2022   AKI (acute kidney injury) ([place]) 01/01/2022   Hyponatremia 01/01/2022   Symptomatic anemia 12/31/2021   Elevated LFTs 12/31/2021   Severe sepsis (HCC) 12/31/2021   Hypokalemia 12/21/2021   Pressure injury of skin 12/20/2021   Hypomagnesemia 12/19/2021   Acute blood loss anemia 12/19/2021   Abnormal vaginal bleeding 12/19/2021   Uncontrolled type 2 diabetes mellitus with hyperglycemia, with long-term current use of insulin ([place]) 12/19/2021   Chronic kidney disease, stage 5 ([place])  12/18/2021   Chronic pain syndrome 12/18/2021   Fall at home 12/18/2021   DNR (do not resuscitate)/DNI(Do Not Intubate) 12/18/2021   Type 2 diabetes mellitus with chronic kidney disease, with long-term current use of insulin ([place]) 12/18/2021   Anemia of renal disease 12/18/2021   Long term (current) use of insulin ([place]) 08/30/2018   OSA (obstructive sleep apnea) 11/27/2014   Sleep disturbance 10/10/2014   Severe obesity (BMI >= 40) ([place]) 10/10/2014   Palpitation 09/11/2014   Dyspnea 09/11/2014   Morbid (severe) obesity due to excess calories ([place]) 07/01/2010   Essential hypertension 06/06/2009    Palliative Care Assessment & Plan    Recommendations/Plan:  Recommend increasing Fentanyl patch to 39mcg. Would continue to wean Oxycodone with goal of using prior to working with therapy and before dressing changes, and hopeful discontinuation. Recommend Oxycodone use 60-90 minutes prior to dressing changes or PT/OT.    Recommend scheduled Tylenol as part of multi-modal therapy.   Recommend outpatient palliative.    Code Status:    Code Status Orders  (From admission, onward)           Start     Ordered   12/31/21 2333  Do not attempt resuscitation (DNR)  Continuous       Question Answer Comment  In the event of cardiac or respiratory ARREST Do not call a "code blue"   In the event of cardiac or respiratory ARREST Do not perform Intubation, CPR, defibrillation or ACLS   In the event of cardiac or respiratory ARREST Use medication by any route, position, wound care, and other measures to relive pain and suffering. May use oxygen, suction and manual treatment of airway obstruction as needed for comfort.   Comments verified with patient      12/31/21 2332           Code Status History     Date  Active Date Inactive Code Status Order ID Comments User Context   12/18/2021 1711 12/23/2021 1816 DNR [id number]  Kristopher Oppenheim, DO Inpatient   12/18/2021 1432 12/18/2021 1711 DNR  [id number]  Kristopher Oppenheim, DO ED      Advance Directive Documentation    Flowsheet Row Most Recent Value  Type of Advance Directive Out of facility DNR (pink MOST or yellow form)  Pre-existing out of facility DNR order (yellow form or pink MOST form) --  "MOST" Form in Place? --       Prognosis:  Unable to determine   Care plan was discussed with primary team and PT  Thank you for allowing the Palliative Medicine Team to assist in the care of this patient.  Asencion Gowda, NP  Please contact Palliative Medicine Team phone at [phone] for questions and concerns.

## 2022-02-03 NOTE — Progress Notes (Signed)
[facility] [place] NAME: Theresa Warren    MR#:  [id number]  DATE OF BIRTH:  1955/12/22  SUBJECTIVE:   Seen at HD. Had slipped in the room with RN and NA in the room. No injury. Did HD in the chair VITALS:  Blood pressure 135/70, pulse 95, temperature 98.7 F (37.1 C), resp. rate 19, height 5\' 3"  (1.6 m), weight 97.4 kg, SpO2 97 %.  PHYSICAL EXAMINATION:   GENERAL:  66 y.o.-year-old patient lying in the bed  Morbidly obese appears chronically ill and deconditioned LUNGS: Normal breath sounds bilaterally CARDIOVASCULAR: S1, S2 normal. No murmurs.  ABDOMEN: Soft, nontender, nondistended. Abdominal obesity NEUROLOGIC: nonfocal  patient is alert and awake SKIN: Areas of central infarction with surrounding painful induration over the upper thighs, inner thigs , left calf  Wounds as of 01/31/22           LABORATORY PANEL:  CBC Recent Labs  Lab 02/02/22 1045  WBC 10.6*  HGB 8.2*  HCT 26.1*  PLT 269     Chemistries  Recent Labs  Lab 02/02/22 1045  NA 132*  K 4.0  CL 94*  CO2 22  GLUCOSE 167*  BUN 67*  CREATININE 4.74*  CALCIUM 9.3     Assessment and Plan Brittanee Ghazarian is a 66 year old female hyperlipidemia, hypertension, CKD stage V, insulin-dependent diabetes mellitus, who presents emergency department from [facility] for evaluation of low sodium noted on labs at the facility.  She was discharged to rehab on 5/23 after being admitted following a fall and also with acute on chronic anemia.  Patient has been here for last 27 days. Please review detailed progress note in [facility] if needed.  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 Steva Ready has messaged dermatology to discuss and give curbside consultation -- currently off antibiotic --continue aggressive wound care --discussed pain management--for now cont fentanyl patch +oxycodone. Moving forward increased fentanyl dose and decrease oxycodone dose to get better pain control. Pt agreeable. --Wound pictures as off 01/31/2022 taken. Pt understands the severity of his wounds and that they are going to take a very long time to heal. Will need aggressive wound care at home.  Bilateral sacral wounds -- status post debridement with surgery -- continue dressing changes as per instructions  End-stage renal disease on hemodialysis -- patient will need outpatient hemodialysis-- dialysis coordinator working on and -- She is having a very hard time sitting up for dialysis given her morbid obesity significant pain from her skin lesions -- patient sat in the recliner at dialysis. Will continue to do the same  generalized weakness, deconditioning -- PT to see as and when able to -- recommends rehab however patient is adamant to go home.  -- DME ordered  anemia of chronic disease in the setting of ESRD -- status post two unit blood transfusion -- no evidence of any active bleeding. -- No history of vaginally bleeding. 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 open area right below coccyx (Active)  12/20/21 0800  Location: Buttocks  Location Orientation: Left  Staging: Stage 4 - Full thickness tissue loss with exposed bone, tendon or muscle.  Wound Description  (Comments): open area right below coccyx  Present on Admission: Yes (First day with patient. Unknown if present at admission.)     Pressure Injury 01/01/22 Buttocks Left Stage 3 -  Full thickness tissue loss. Subcutaneous fat may be visible but bone, tendon or muscle are NOT exposed. (Active)  01/01/22 0222  Location: Buttocks  Location Orientation: Left  Staging: Stage 3 -  Full thickness tissue loss. Subcutaneous fat may be visible but bone, tendon or muscle are NOT exposed.  Wound Description (Comments):   Present on Admission: Yes     Pressure Injury 01/01/22 Buttocks Right Stage 4 - Full thickness tissue loss with exposed bone, tendon or muscle. (Active)  01/01/22 0223  Location: Buttocks  Location Orientation: Right  Staging: Stage 4 - Full thickness tissue loss with exposed bone, tendon or muscle.  Wound Description (Comments):   Present on Admission: Yes     Pressure Injury 01/01/22 Hip Left Stage 4 - Full thickness tissue loss with exposed bone, tendon or muscle. (Active)  01/01/22 0225  Location: Hip  Location Orientation: Left  Staging: Stage 4 - Full thickness tissue loss with exposed bone, tendon or muscle.  Wound Description (Comments):   Present on Admission: Yes     Pressure Injury 01/01/22 Hip Right Stage 3 -  Full thickness tissue loss. Subcutaneous fat may be visible but bone, tendon or muscle are NOT exposed. (Active)  01/01/22 0225  Location: Hip  Location Orientation: Right  Staging: Stage 3 -  Full thickness tissue loss. Subcutaneous fat may be visible but bone, tendon or muscle are NOT exposed.  Wound Description (Comments):   Present on Admission: Yes     Pressure Injury 01/12/22 Thigh Left;Upper Unstageable - Full thickness tissue loss in which the base of the injury is covered by slough (yellow, tan, gray, green or brown) and/or eschar (tan, brown or black) in the wound bed. previously noted "rugburns"  (Active)  01/12/22   Location: Thigh  Location  Orientation: Left;Upper  Staging: Unstageable - Full thickness tissue loss in which the base of the injury is covered by slough (yellow, tan, gray, green or brown) and/or eschar (tan, brown or black) in the wound bed.  Wound Description (Comments): previously noted "rugburns" have evolved  Present on Admission: Yes     Pressure Injury 01/12/22 Thigh Left;Posterior Deep Tissue Pressure Injury - Purple or maroon localized area of discolored intact skin or blood-filled blister due to damage of underlying soft tissue from pressure and/or shear. previously noted " rugburns" (Active)  01/12/22   Location: Thigh  Location Orientation: Left;Posterior  Staging: Deep Tissue Pressure Injury - Purple or maroon localized area of discolored intact skin or blood-filled blister due to damage of underlying soft tissue from pressure and/or shear.  Wound Description (Comments): previously noted " rugburns" have evolved  Present on Admission: Yes    Overall poor prognosis  Family communication : friend [name] on the phone  96/29) Consults : ID, nephrology CODE STATUS: DNR DVT Prophylaxis : due to severe necrotic skin lesions (?heparin) unable to give it. Level of care: Med-Surg Status is: Inpatient Remains inpatient appropriate because AWAITING out patient HD chair time. Once obtained  pt will go home with HHPT/OT/RN Home bed,walker and 3 in 1 ordered  Dialysis coordinator aware of above   Patient and her friend [name] is aware.  TOTAL TIME TAKING CARE OF THIS PATIENT: 35 minutes.  >50% time spent on counselling and coordination of care  Note: This dictation was prepared with Dragon dictation along with smaller phrase technology. Any transcriptional errors that result from this process are unintentional.  Fritzi Mandes M.D    [facility]   CC: Primary care physician; Crist Infante, MD

## 2022-02-03 NOTE — Progress Notes (Signed)
[facility]  PROGRESS NOTE   Subjective:   Patient underwent hemodialysis treatment yesterday. Tolerated well. Transitioning towards home.   Objective:  Vital signs: Blood pressure 135/70, pulse 95, temperature 98.7 F (37.1 C), resp. rate 19, height 5\' 3"  (1.6 m), weight 97.4 kg, SpO2 97 %.  Intake/Output Summary (Last 24 hours) at 02/03/2022 1432 Last data filed at 02/03/2022 1300 Gross per 24 hour  Intake 240 ml  Output 100 ml  Net 140 ml    Filed Weights   01/28/22 1317 01/30/22 0850 02/01/22 0100  Weight: 104 kg 97.6 kg 97.4 kg     Physical Exam: General:  No acute distress  Head:  Normocephalic, atraumatic. Moist oral mucosal membranes  Eyes:  Anicteric  Lungs:   Clear to auscultation, normal effort  Heart:  S1S2 no rubs  Abdomen:   Soft, nontender, bowel sounds present  Extremities:  Trace right peripheral edema.1+ left peripheral edema  Neurologic:  Awake, alert, following commands  Skin:  Sacral wounds  Access:  Right IJ PermCath    Basic Metabolic Panel: Recent Labs  Lab 01/28/22 1000 01/31/22 0645 02/02/22 1045  NA 136 134* 132*  K 3.9 3.3* 4.0  CL 97* 94* 94*  CO2 28 23 22   GLUCOSE 165* 130* 167*  BUN 50* 41* 67*  CREATININE 4.37* 3.03* 4.74*  CALCIUM 8.9 9.6 9.3  PHOS 3.5 2.0* 3.8     CBC: Recent Labs  Lab 01/28/22 1000 01/31/22 0645 02/02/22 1045  WBC 7.5 11.2* 10.6*  HGB 8.3* 9.2* 8.2*  HCT 27.1* 28.5* 26.1*  MCV 88.3 85.8 87.3  PLT 265 252 269      Urinalysis: No results for input(s): "COLORURINE", "LABSPEC", "PHURINE", "GLUCOSEU", "HGBUR", "BILIRUBINUR", "KETONESUR", "PROTEINUR", "UROBILINOGEN", "NITRITE", "LEUKOCYTESUR" in the last 72 hours.  Invalid input(s): "APPERANCEUR"    Imaging: No results found.   Medications:    sodium chloride Stopped (01/19/22 1845)   sodium thiosulfate 25 g in sodium chloride 0.9 % 200 mL Infusion for Calciphylaxis Stopped (02/02/22 1414)    vitamin C  500 mg Oral BID    bisacodyl  10 mg Rectal Once   Chlorhexidine Gluconate Cloth  6 each Topical Q0600   epoetin (EPOGEN/PROCRIT) injection  10,000 Units Intravenous Q M,W,F-HD   fentaNYL  1 patch Transdermal Q72H   fluticasone  2 spray Each Nare Daily   gabapentin  200 mg Oral TID   insulin aspart  0-5 Units Subcutaneous QHS   insulin aspart  0-9 Units Subcutaneous TID WC   insulin glargine-yfgn  15 Units Subcutaneous BID   leptospermum manuka honey  1 Application Topical Daily   loratadine  10 mg Oral Daily   metoprolol succinate  25 mg Oral BID   multivitamin  1 tablet Oral QHS   nutrition supplement (JUVEN)  1 packet Oral BID BM   polyethylene glycol  17 g Oral Daily   Ensure Max Protein  11 oz Oral BID   senna-docusate  1 tablet Oral BID    Assessment/ Plan:     Principal Problem:   Severe sepsis (HCC) Active Problems:   OSA (obstructive sleep apnea)   Chronic kidney disease, stage 5 ([place])   Essential hypertension   DNR (do not resuscitate)/DNI(Do Not Intubate)   Type 2 diabetes mellitus with chronic kidney disease, with long-term current use of insulin (HCC)   Hypomagnesemia   Pressure injury of skin   Symptomatic anemia   Elevated LFTs   Sacral wound, initial encounter   Swelling  of left lower extremity   Abdominal pain   AKI (acute kidney injury) ([place])   Hyponatremia   Premature atrial complexes   Anasarca associated with disorder of kidney   Calciphylaxis   ESRD (end stage renal disease) ([place])  66 year old female with history of hypertension, diabetes, peripheral vascular disease, hyperlipidemia, now with sepsis and acute kidney injury on the top of chronic kidney disease.  Patient is reached end-stage renal disease and was started on renal replacement therapy.   2D echo from January 03, 2022-:LVEF 60 to 65%, no regional wall motion abnormalities, asymmetric left ventricular hypertrophy of the basal septal segment.  Diastolic parameters normal.   #: End-stage renal disease on  dialysis:  Patient underwent hemodialysis treatment yesterday.  Tolerated well.  Renal navigator still in the process of securing new dialysis chair for the patient.   #: Sepsis/fever likely from sacral wound. Biopsy negative for calciphylaxis. Prescribed Meropenem, with plans to hold. Negative pressure wound VAC remains in place and managed by surgery team.  Maintain sodium thiosulfate on dialysis treatment days.   #: Anemia secondary to chronic kidney disease complicated by sepsis. Has received blood transfusions during this admission.  Lab Results  Component Value Date   HGB 8.2 (L) 02/02/2022  Maintain Epogen 10,000 IV with dialysis treatments.   #: Secondary hyperparathyroidism:  She will need continued monitoring of bone mineral metabolism parameters as outpatient.  # Diabetes with CKD Lab Results  Component Value Date   HGBA1C 10.0 (H) 12/18/2021  Improved glucose control.  Primary team to manage SSI    LOS: [id number] Glendy [place] [facility] 7/4/20232:32 PM

## 2022-02-04 DIAGNOSIS — A419 Sepsis, unspecified organism: Secondary | ICD-10-CM | POA: Diagnosis not present

## 2022-02-04 DIAGNOSIS — Z7189 Other specified counseling: Secondary | ICD-10-CM | POA: Diagnosis not present

## 2022-02-04 DIAGNOSIS — R6520 Severe sepsis without septic shock: Secondary | ICD-10-CM | POA: Diagnosis not present

## 2022-02-04 LAB — RENAL FUNCTION PANEL
Albumin: 2.3 g/dL — ABNORMAL LOW (ref 3.5–5.0)
Anion gap: 16 — ABNORMAL HIGH (ref 5–15)
BUN: 25 mg/dL — ABNORMAL HIGH (ref 8–23)
CO2: 26 mmol/L (ref 22–32)
Calcium: 9.1 mg/dL (ref 8.9–10.3)
Chloride: 96 mmol/L — ABNORMAL LOW (ref 98–111)
Creatinine, Ser: 2.48 mg/dL — ABNORMAL HIGH (ref 0.44–1.00)
GFR, Estimated: 21 mL/min — ABNORMAL LOW (ref >60)
Glucose, Bld: 230 mg/dL — ABNORMAL HIGH (ref 70–99)
Phosphorus: 1.7 mg/dL — ABNORMAL LOW (ref 2.5–4.6)
Potassium: 3.9 mmol/L (ref 3.5–5.1)
Sodium: 138 mmol/L (ref 135–145)

## 2022-02-04 LAB — CBC
HCT: 29.5 % — ABNORMAL LOW (ref 36.0–46.0)
Hemoglobin: 9.2 g/dL — ABNORMAL LOW (ref 12.0–15.0)
MCH: 27 pg (ref 26.0–34.0)
MCHC: 31.2 g/dL (ref 30.0–36.0)
MCV: 86.5 fL (ref 80.0–100.0)
Platelets: 262 10*3/uL (ref 150–400)
RBC: 3.41 MIL/uL — ABNORMAL LOW (ref 3.87–5.11)
RDW: 16.5 % — ABNORMAL HIGH (ref 11.5–15.5)
WBC: 11.5 10*3/uL — ABNORMAL HIGH (ref 4.0–10.5)
nRBC: 0 % (ref 0.0–0.2)

## 2022-02-04 LAB — GLUCOSE, CAPILLARY
Glucose-Capillary: 83 mg/dL (ref 70–99)
Glucose-Capillary: 202 mg/dL — ABNORMAL HIGH (ref 70–99)
Glucose-Capillary: 255 mg/dL — ABNORMAL HIGH (ref 70–99)
Glucose-Capillary: 242 mg/dL — ABNORMAL HIGH (ref 70–99)

## 2022-02-04 MED ORDER — OXYCODONE HCL 5 MG PO TABS
ORAL_TABLET | ORAL | Status: AC
  Filled 2022-02-04: qty 1

## 2022-02-04 MED ORDER — HEPARIN SODIUM (PORCINE) 1000 UNIT/ML IJ SOLN
INTRAMUSCULAR | Status: AC
  Administered 2022-02-04: 4200 [IU] via INTRAVENOUS_CENTRAL
  Filled 2022-02-04: qty 10

## 2022-02-04 MED ORDER — EPOETIN ALFA 10000 UNIT/ML IJ SOLN
INTRAMUSCULAR | Status: AC
  Administered 2022-02-04: 10000 [IU] via INTRAVENOUS
  Filled 2022-02-04: qty 1

## 2022-02-04 NOTE — Plan of Care (Deleted)
Daily Progress Note   Patient Name: Theresa Warren       Date: 02/04/2022 DOB: Aug 21, 1955  Age: 66 y.o. MRN#: [id number] Attending Physician: Enzo Bi, MD Primary Care Physician: Crist Infante, MD Admit Date: 12/31/2021  Reason for Consultation/Follow-up: Establishing goals of care  Subjective: Notes reviewed. Patient is working towards discharge and is tolerating dialysis. She understands the importance of working with PT/OT. She states her cousins will help her at home, but will not be present at all times. She states she has considered life [facility]. She understands she may need SNF placement and states she just wants anything but [facility].   Recommend outpatient palliative.   Length of Stay: 34  Current Medications: Scheduled Meds:   vitamin C  500 mg Oral BID   bisacodyl  10 mg Rectal Once   Chlorhexidine Gluconate Cloth  6 each Topical Q0600   epoetin (EPOGEN/PROCRIT) injection  10,000 Units Intravenous Q M,W,F-HD   fentaNYL  1 patch Transdermal Q72H   fluticasone  2 spray Each Nare Daily   gabapentin  200 mg Oral TID   insulin aspart  0-5 Units Subcutaneous QHS   insulin aspart  0-9 Units Subcutaneous TID WC   insulin glargine-yfgn  15 Units Subcutaneous BID   leptospermum manuka honey  1 Application Topical Daily   loratadine  10 mg Oral Daily   metoprolol succinate  25 mg Oral BID   multivitamin  1 tablet Oral QHS   nutrition supplement (JUVEN)  1 packet Oral BID BM   polyethylene glycol  17 g Oral Daily   Ensure Max Protein  11 oz Oral BID   senna-docusate  1 tablet Oral BID    Continuous Infusions:  sodium chloride Stopped (01/19/22 1845)   sodium thiosulfate 25 g in sodium chloride 0.9 % 200 mL Infusion for Calciphylaxis Stopped (02/02/22 1414)    PRN  Meds: sodium chloride, acetaminophen, alteplase, heparin, lidocaine-prilocaine, Muscle Rub, ondansetron **OR** ondansetron (ZOFRAN) IV, mouth rinse, oxyCODONE, pentafluoroprop-tetrafluoroeth  Physical Exam Pulmonary:     Effort: Pulmonary effort is normal.  Neurological:     Mental Status: She is alert.             Vital Signs: BP (!) 125/53 (BP Location: Right Arm)   Pulse 81   Temp 98.8 F (37.1 C)  Resp 15   Ht 5\' 3"  (1.6 m)   Wt 102 kg   SpO2 96%   BMI 39.83 kg/m  SpO2: SpO2: 96 % O2 Device: O2 Device: Room Air O2 Flow Rate: O2 Flow Rate (L/min): 3 L/min  Intake/output summary:  Intake/Output Summary (Last 24 hours) at 02/04/2022 3810 Last data filed at 02/03/2022 1300 Gross per 24 hour  Intake 240 ml  Output --  Net 240 ml   LBM: Last BM Date : 01/23/22 (previously charted) Baseline Weight: Weight: 97 kg Most recent weight: Weight: 102 kg       Palliative Assessment/Data: 50-60%      Patient Active Problem List   Diagnosis Date Noted   ESRD (end stage renal disease) ([place])    Calciphylaxis    Anasarca associated with disorder of kidney 01/05/2022   Premature atrial complexes 01/02/2022   Sacral wound, initial encounter 01/01/2022   Swelling of left lower extremity 01/01/2022   Abdominal pain 01/01/2022   AKI (acute kidney injury) ([place]) 01/01/2022   Hyponatremia 01/01/2022   Symptomatic anemia 12/31/2021   Elevated LFTs 12/31/2021   Severe sepsis ([place]) 12/31/2021   Hypokalemia 12/21/2021   Pressure injury of skin 12/20/2021   Hypomagnesemia 12/19/2021   Acute blood loss anemia 12/19/2021   Abnormal vaginal bleeding 12/19/2021   Uncontrolled type 2 diabetes mellitus with hyperglycemia, with long-term current use of insulin ([place]) 12/19/2021   Chronic kidney disease, stage 5 ([place]) 12/18/2021   Chronic pain syndrome 12/18/2021   Fall at home 12/18/2021   DNR (do not resuscitate)/DNI(Do Not Intubate) 12/18/2021   Type 2 diabetes mellitus with chronic  kidney disease, with long-term current use of insulin ([place]) 12/18/2021   Anemia of renal disease 12/18/2021   Long term (current) use of insulin ([place]) 08/30/2018   OSA (obstructive sleep apnea) 11/27/2014   Sleep disturbance 10/10/2014   Severe obesity (BMI >= 40) ([place]) 10/10/2014   Palpitation 09/11/2014   Dyspnea 09/11/2014   Morbid (severe) obesity due to excess calories ([place]) 07/01/2010   Essential hypertension 06/06/2009    Palliative Care Assessment & Plan     Recommendations/Plan:  DNR/DNI.  Patient amenable to outpatient dialysis.  HPOA forms have been completed by spiritual care and scanned into ACP tab.   Recommend outpatient palliative. If needed, please see note from yesterday 7/4 for pain recommendations. PMT will shadow moving forward.   Code Status:    Code Status Orders  (From admission, onward)           Start     Ordered   12/31/21 2333  Do not attempt resuscitation (DNR)  Continuous       Question Answer Comment  In the event of cardiac or respiratory ARREST Do not call a "code blue"   In the event of cardiac or respiratory ARREST Do not perform Intubation, CPR, defibrillation or ACLS   In the event of cardiac or respiratory ARREST Use medication by any route, position, wound care, and other measures to relive pain and suffering. May use oxygen, suction and manual treatment of airway obstruction as needed for comfort.   Comments verified with patient      12/31/21 2332           Code Status History     Date Active Date Inactive Code Status Order ID Comments User Context   12/18/2021 1711 12/23/2021 1816 DNR [id number]  Kristopher Oppenheim, DO Inpatient   12/18/2021 1432 12/18/2021 1711 DNR [id number]  Kristopher Oppenheim, DO ED  Advance Directive Documentation    Flowsheet Row Most Recent Value  Type of Advance Directive Out of facility DNR (pink MOST or yellow form)  Pre-existing out of facility DNR order (yellow form or pink MOST form) --  "MOST" Form  in Place? --       Thank you for allowing the Palliative Medicine Team to assist in the care of this patient.   Asencion Gowda, NP  Please contact Palliative Medicine Team phone at [phone] for questions and concerns.

## 2022-02-04 NOTE — Progress Notes (Signed)
PT Cancellation Note  Patient Details Name: Theresa Warren MRN: [id number] DOB: Jul 27, 1956   Cancelled Treatment:    Reason Eval/Treat Not Completed: Other (comment) Pt declined PT this date. Pt stated she was told that today was going to be her day off from therapy, but it has been anything except that with many interruptions throughout the day. Pt stated she knows she would be falling all over the place if she got up right now. PT provided options for activities, but pt declined. PT discussed plan for re-evaluation, and pt seemed agreeable to PT tomorrow morning. Will re-attempt session next date.   Kyanne Rials, SPT  Theresa Warren 02/04/2022, 3:47 PM

## 2022-02-04 NOTE — Progress Notes (Signed)
Patient refused wound care.

## 2022-02-04 NOTE — Progress Notes (Signed)
[place] [facility] [place] NAME: Theresa Warren    MR#:  [id number]  DATE OF BIRTH:  27-Sep-1955  SUBJECTIVE:   C/o weakness VITALS:  Blood pressure (!) 127/57, pulse (!) 103, temperature 99.9 F (37.7 C), resp. rate 16, height 5\' 3"  (1.6 m), weight 102 kg, SpO2 95 %.  PHYSICAL EXAMINATION:   GENERAL:  66 y.o.-year-old patient lying in the bed  Morbidly obese appears chronically ill and deconditioned LUNGS: Normal breath sounds bilaterally CARDIOVASCULAR: S1, S2 normal. No murmurs.  ABDOMEN: Soft, nontender, nondistended. Abdominal obesity NEUROLOGIC: nonfocal  patient is alert and awake SKIN: Areas of central infarction with surrounding painful induration over the upper thighs, inner thigs , left calf  Wounds as of 01/31/22           LABORATORY PANEL:  CBC Recent Labs  Lab 02/04/22 1742  WBC 11.5*  HGB 9.2*  HCT 29.5*  PLT 262     Chemistries  Recent Labs  Lab 02/04/22 1742  NA 138  K 3.9  CL 96*  CO2 26  GLUCOSE 230*  BUN 25*  CREATININE 2.48*  CALCIUM 9.1     Assessment and Plan Theresa Warren is a 66 year old female hyperlipidemia, hypertension, CKD stage V, insulin-dependent diabetes mellitus, who presents emergency department from [facility] for evaluation of low sodium noted on labs at the facility.  She was discharged to rehab on 5/23 after being admitted following a fall and also with acute on chronic anemia.  Patient has been here for last 27 days. Please review detailed progress note in [facility] if needed.  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 Steva Ready has messaged dermatology to discuss and  give curbside consultation -- currently off antibiotic --continue aggressive wound care --discussed pain management--for now cont fentanyl patch +oxycodone. Moving forward increased fentanyl dose and decrease oxycodone dose to get better pain control. Pt agreeable. --Wound pictures as off 01/31/2022 taken. Pt understands the severity of his wounds and that they are going to take a very long time to heal. Will need aggressive wound care at home.  Bilateral sacral wounds -- status post debridement with surgery -- continue dressing changes as per instructions  End-stage renal disease on hemodialysis -- patient will need outpatient hemodialysis-- dialysis coordinator working on and -- She is having a very hard time sitting up for dialysis given her morbid obesity significant pain from her skin lesions -- patient sat in the recliner at dialysis. Will continue to do the same  generalized weakness, deconditioning -- PT to see as and when able to -- recommends rehab however patient is adamant to go home.  -- DME ordered  anemia of chronic disease in the setting of ESRD -- status post two unit blood transfusion -- no evidence of any active bleeding. -- No history of vaginally bleeding. 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 open area right below coccyx (Active)  12/20/21 0800  Location: Buttocks  Location Orientation: Left  Staging: Stage 4 - Full thickness tissue loss with exposed bone, tendon or muscle.  Wound Description (Comments): open area right below coccyx  Present on Admission: Yes (First  day with patient. Unknown if present at admission.)     Pressure Injury 01/01/22 Buttocks Left Stage 3 -  Full thickness tissue loss. Subcutaneous fat may be visible but bone, tendon or muscle are NOT exposed. (Active)  01/01/22 0222  Location: Buttocks  Location Orientation: Left  Staging: Stage 3 -  Full thickness tissue loss. Subcutaneous fat may be visible but bone, tendon or muscle are NOT exposed.  Wound Description (Comments):   Present on Admission: Yes     Pressure Injury 01/01/22 Buttocks Right Stage 4 - Full thickness tissue loss with exposed bone, tendon or muscle. (Active)  01/01/22 0223  Location: Buttocks  Location Orientation: Right  Staging: Stage 4 - Full thickness tissue loss with exposed bone, tendon or muscle.  Wound Description (Comments):   Present on Admission: Yes     Pressure Injury 01/01/22 Hip Left Stage 4 - Full thickness tissue loss with exposed bone, tendon or muscle. (Active)  01/01/22 0225  Location: Hip  Location Orientation: Left  Staging: Stage 4 - Full thickness tissue loss with exposed bone, tendon or muscle.  Wound Description (Comments):   Present on Admission: Yes     Pressure Injury 01/01/22 Hip Right Stage 3 -  Full thickness tissue loss. Subcutaneous fat may be visible but bone, tendon or muscle are NOT exposed. (Active)  01/01/22 0225  Location: Hip  Location Orientation: Right  Staging: Stage 3 -  Full thickness tissue loss. Subcutaneous fat may be visible but bone, tendon or muscle are NOT exposed.  Wound Description (Comments):   Present on Admission: Yes     Pressure Injury 01/12/22 Thigh Left;Upper Unstageable - Full thickness tissue loss in which the base of the injury is covered by slough (yellow, tan, gray, green or brown) and/or eschar (tan, brown or black) in the wound bed. previously noted "rugburns"  (Active)  01/12/22   Location: Thigh  Location Orientation: Left;Upper  Staging: Unstageable - Full thickness tissue loss in  which the base of the injury is covered by slough (yellow, tan, gray, green or brown) and/or eschar (tan, brown or black) in the wound bed.  Wound Description (Comments): previously noted "rugburns" have evolved  Present on Admission: Yes     Pressure Injury 01/12/22 Thigh Left;Posterior Deep Tissue Pressure Injury - Purple or maroon localized area of discolored intact skin or blood-filled blister due to damage of underlying soft tissue from pressure and/or shear. previously noted " rugburns" (Active)  01/12/22   Location: Thigh  Location Orientation: Left;Posterior  Staging: Deep Tissue Pressure Injury - Purple or maroon localized area of discolored intact skin or blood-filled blister due to damage of underlying soft tissue from pressure and/or shear.  Wound Description (Comments): previously noted " rugburns" have evolved  Present on Admission: Yes    Overall poor prognosis  Family communication :none today Consults : ID, nephrology CODE STATUS: DNR DVT Prophylaxis : due to severe necrotic skin lesions (?heparin) unable to give it. Level of care: Med-Surg Status is: Inpatient Remains inpatient appropriate because AWAITING out patient HD chair time. Once obtained pt will go home with HHPT/OT/RN. Pt has declined rehab before Home bed,walker and 3 in 1 ordered  Dialysis coordinator aware of above  Patient and her friend [name] is aware.  TOTAL TIME TAKING CARE OF THIS PATIENT: 35 minutes.  >50% time spent on counselling and coordination of care  Note: This dictation was prepared with Dragon dictation along with smaller phrase technology. Any transcriptional errors that result from this process are unintentional.  Fritzi Mandes M.D    [facility]   CC: Primary care physician; Crist Infante, MD

## 2022-02-04 NOTE — Progress Notes (Signed)
OT Cancellation Note  Patient Details Name: Theresa Warren MRN: [id number] DOB: Jun 30, 1956   Cancelled Treatment:    Reason Eval/Treat Not Completed: Other (comment) Pt back from dialysis and up in chair.  Pt adamantly declined OT activities this date.   Leta Speller, MS, OTR/L  Theresa Warren 02/04/2022, 2:37 PM

## 2022-02-04 NOTE — Progress Notes (Signed)
[place] at [place] NAME: Theresa Warren    MR#:  [id number]  DATE OF BIRTH:  [birth year]  SUBJECTIVE:  Pt had dialysis today, no issues.  Pt now is willing to consider SNF rehab.  VITALS:  Blood pressure 137/70, pulse (!) 116, temperature 99.1 F (37.3 C), resp. rate 15, height 5\' 3"  (1.6 m), weight 102 kg, SpO2 100 %.  PHYSICAL EXAMINATION:   Constitutional: NAD, AAOx3, sitting in recliner HEENT: conjunctivae and lids normal, EOMI CV: No cyanosis.   RESP: normal respiratory effort, on RA SKIN: warm, dry Neuro: II - XII grossly intact.     LABORATORY PANEL:  CBC Recent Labs  Lab 02/02/22 1045  WBC 10.6*  HGB 8.2*  HCT 26.1*  PLT 269    Chemistries  Recent Labs  Lab 02/02/22 1045  NA 132*  K 4.0  CL 94*  CO2 22  GLUCOSE 167*  BUN 67*  CREATININE 4.74*  CALCIUM 9.3    Assessment and Plan Theresa Warren is a 66 year old female hyperlipidemia, hypertension, CKD stage V, insulin-dependent diabetes mellitus, who presents emergency department from [facility] for evaluation of low sodium noted on labs at the facility.  She was discharged to rehab on 5/23 after being admitted following a fall and also with acute on chronic anemia.  Patient has been here for last 27 days. Please review detailed progress note in [facility] if needed.  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 Steva Ready has messaged dermatology to discuss and give curbside consultation -- currently off antibiotic Plan: --continue aggressive wound care --discussed pain management--for now cont fentanyl patch +oxycodone.   Bilateral sacral wounds -- status post  debridement with surgery --cont dressing change per order  End-stage renal disease on hemodialysis -- She is having a very hard time sitting up for dialysis given her morbid obesity significant pain from her skin lesions --need to set up outpatient dialysis before discharge  generalized weakness, deconditioning --pt decided to consider SNF rehab today  anemia of chronic disease in the setting of ESRD -- status post two unit blood transfusion -- no evidence of any active bleeding. -- No history of vaginally bleeding. 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 open area right below coccyx (Active)  12/20/21 0800  Location: Buttocks  Location Orientation: Left  Staging: Stage 4 - Full thickness tissue loss with exposed bone, tendon or muscle.  Wound Description (Comments): open area right below coccyx  Present on Admission: Yes (First day with patient. Unknown if present at admission.)     Pressure Injury 01/01/22 Buttocks Left Stage 3 -  Full thickness tissue loss. Subcutaneous fat may be visible but bone, tendon or muscle are NOT exposed. (Active)  01/01/22 0222  Location: Buttocks  Location Orientation: Left  Staging: Stage 3 -  Full thickness tissue loss. Subcutaneous fat may be visible but bone, tendon or muscle are NOT exposed.  Wound Description (Comments):   Present on Admission: Yes     Pressure Injury 01/01/22 Buttocks Right Stage 4 - Full thickness tissue loss with exposed bone, tendon or muscle. (Active)  01/01/22 0223  Location: Buttocks  Location Orientation: Right  Staging: Stage 4 - Full thickness tissue loss with exposed  bone, tendon or muscle.  Wound Description (Comments):   Present on Admission: Yes     Pressure Injury 01/01/22 Hip Left Stage 4 - Full thickness tissue loss with exposed bone, tendon or muscle. (Active)  01/01/22 0225  Location: Hip  Location Orientation: Left  Staging: Stage 4 - Full thickness tissue loss with exposed bone, tendon or muscle.  Wound Description (Comments):   Present on Admission: Yes     Pressure Injury 01/01/22 Hip Right Stage 3 -  Full thickness tissue loss. Subcutaneous fat may be visible but bone, tendon or muscle are NOT exposed. (Active)  01/01/22 0225  Location: Hip  Location Orientation: Right  Staging: Stage 3 -  Full thickness tissue loss. Subcutaneous fat may be visible but bone, tendon or muscle are NOT exposed.  Wound Description (Comments):   Present on Admission: Yes     Pressure Injury 01/12/22 Thigh Left;Upper Unstageable - Full thickness tissue loss in which the base of the injury is covered by slough (yellow, tan, gray, green or brown) and/or eschar (tan, brown or black) in the wound bed. previously noted "rugburns"  (Active)  01/12/22   Location: Thigh  Location Orientation: Left;Upper  Staging: Unstageable - Full thickness tissue loss in which the base of the injury is covered by slough (yellow, tan, gray, green or brown) and/or eschar (tan, brown or black) in the wound bed.  Wound Description (Comments): previously noted "rugburns" have evolved  Present on Admission: Yes     Pressure Injury 01/12/22 Thigh Left;Posterior Deep Tissue Pressure Injury - Purple or maroon localized area of discolored intact skin or blood-filled blister due to damage of underlying soft tissue from pressure and/or shear. previously noted " rugburns" (Active)  01/12/22   Location: Thigh  Location Orientation: Left;Posterior  Staging: Deep Tissue Pressure Injury - Purple or maroon localized area of discolored intact skin or blood-filled blister due to damage of underlying  soft tissue from pressure and/or shear.  Wound Description (Comments): previously noted " rugburns" have evolved  Present on Admission: Yes    DVT prophylaxis: SCD/Compression stockings Code Status: DNR  Family Communication:  Status is: inpatient Dispo:   The patient is from: home Anticipated d/c is to: SNF Anticipated d/c date is: undetermined Patient currently is not medically stable to d/c due to: outpatient dialysis not set up yet   Enzo Bi M.D    [facility]

## 2022-02-04 NOTE — Progress Notes (Signed)
Nutrition Follow-up  DOCUMENTATION CODES:   Morbid obesity  INTERVENTION:   -Continue Ensure Enlive po BID, each supplement provides 350 kcal and 20 grams of protein.  -Continue 1 packet Juven BID, each packet provides 95 calories, 2.5 grams of protein (collagen), and 9.8 grams of carbohydrate (3 grams sugar); also contains 7 grams of L-arginine and L-glutamine, 300 mg vitamin C, 15 mg vitamin E, 1.2 mcg vitamin B-12, 9.5 mg zinc, 200 mg calcium, and 1.5 g  Calcium Beta-hydroxy-Beta-methylbutyrate to support wound healing  -Continue renal MVI daily -Continue 500 mg vitamin C BID  NUTRITION DIAGNOSIS:   Increased nutrient needs related to wound healing as evidenced by estimated needs.  Ongoing  GOAL:   Patient will meet greater than or equal to 90% of their needs  Progressing   MONITOR:   PO intake, Supplement acceptance, Diet advancement  REASON FOR ASSESSMENT:   Malnutrition Screening Tool    ASSESSMENT:   Pt with PMH of hyperlipidemia, hypertension, insulin-dependent diabetes mellitus, who presents from [facility] for chief concerns of abnormal serum sodium levels on labs.  5/31- s/p Debridement of three decubitus wounds of bilateral buttocks, total debridement area of 133 cm2;  Placement of negative pressure dressing > 50 cm. 6/7- vac changed 6/9- vac changed 6/9- HD cath placed, HD initiated 6/13- soft tissue ultrasound reveals no evidence of abscess 6/14- s/p punch biopsy on lt inner and outerthighs due to possible calciphylaxis   Reviewed I/O's: +140 ml x 24 hours and +362 ml since 362 ml since 01/21/22  Pt out of room for HD at time of visit.   Pt remains with good appetite. Noted meal completions 50-100%. Pt is taking supplements well.   Wt stable since admission.   Medications reviewed and include vitamin C, miralax, and senokot. Miralax and senokot ordered on 01/28/22.    Per palliative care notes, pt amenable to outpatient HD. Plan for SNF  placement once medically stable.   Labs reviewed: CBGS: 83-241 (inpatient orders for glycemic control are 0-5 units insulin aspart daily at bedtime, 0-9 units insulin aspart TID with meals, and 15 units insulin glargine-yfgn BID).    Diet Order:   Diet Order             Diet renal/carb modified with fluid restriction Diet-HS Snack? Nothing; Fluid restriction: 1200 mL Fluid; Room service appropriate? Yes; Fluid consistency: Thin  Diet effective now                   EDUCATION NEEDS:   Not appropriate for education at this time  Skin:  Skin Assessment: Skin Integrity Issues: Skin Integrity Issues:: Stage IV DTI: lt thigh Stage II: rt buttocks, bilateral buttocks, lt hips Stage III: lt buttocks, rt hip Stage IV: lt hip, lt buttocks, sacrum Wound Vac: buttocks  Last BM:  01/23/22  Height:   Ht Readings from Last 1 Encounters:  12/31/21 5\' 3"  (1.6 m)    Weight:   Wt Readings from Last 1 Encounters:  02/04/22 102 kg    Ideal Body Weight:  52.3 kg  BMI:  Body mass index is 39.83 kg/m.  Estimated Nutritional Needs:   Kcal:  1650-1850  Protein:  90-105 grams  Fluid:  1000 ml + UOP    Loistine Chance, RD, LDN, [place] Registered Dietitian II Certified Diabetes Care and Education Specialist Please refer to [facility] for RD and/or RD on-call/weekend/after hours pager

## 2022-02-04 NOTE — NC FL2 (Signed)
[place] LEVEL OF CARE SCREENING TOOL     IDENTIFICATION  Patient Name: Theresa Warren Birthdate: [birth year] Sex: female Admission Date (Current Location): 12/31/2021  [facility] [place] Number:  Engineering geologist and Address:  [facility], [address], [place], [place] [address]      Provider Number: [id number]  Attending Physician Name and Address:  Enzo Bi, MD  Relative Name and Phone Number:  none    Current Level of Care: Hospital Recommended Level of Care: [place] Prior Approval Number:    Date Approved/Denied:   PASRR Number: [id number]  Discharge Plan: SNF    Current Diagnoses: Patient Active Problem List   Diagnosis Date Noted   ESRD (end stage renal disease) ([place])    Calciphylaxis    Anasarca associated with disorder of kidney 01/05/2022   Premature atrial complexes 01/02/2022   Sacral wound, initial encounter 01/01/2022   Swelling of left lower extremity 01/01/2022   Abdominal pain 01/01/2022   AKI (acute kidney injury) ([place]) 01/01/2022   Hyponatremia 01/01/2022   Symptomatic anemia 12/31/2021   Elevated LFTs 12/31/2021   Severe sepsis ([place]) 12/31/2021   Hypokalemia 12/21/2021   Pressure injury of skin 12/20/2021   Hypomagnesemia 12/19/2021   Acute blood loss anemia 12/19/2021   Abnormal vaginal bleeding 12/19/2021   Uncontrolled type 2 diabetes mellitus with hyperglycemia, with long-term current use of insulin ([place]) 12/19/2021   Chronic kidney disease, stage 5 ([place]) 12/18/2021   Chronic pain syndrome 12/18/2021   Fall at home 12/18/2021   DNR (do not resuscitate)/DNI(Do Not Intubate) 12/18/2021   Type 2 diabetes mellitus with chronic kidney disease, with long-term current use of insulin ([place]) 12/18/2021   Anemia of renal disease 12/18/2021   Long term (current) use of insulin ([place]) 08/30/2018   OSA (obstructive sleep apnea) 11/27/2014   Sleep disturbance 10/10/2014    Severe obesity (BMI >= 40) ([place]) 10/10/2014   Palpitation 09/11/2014   Dyspnea 09/11/2014   Morbid (severe) obesity due to excess calories ([place]) 07/01/2010   Essential hypertension 06/06/2009    Orientation RESPIRATION BLADDER Height & Weight     Self, Time, Situation, Place  Normal Incontinent, External catheter Weight:  (unable to stand up for weight, pt is on a chair) Height:  5\' 3"  (160 cm)  BEHAVIORAL SYMPTOMS/MOOD NEUROLOGICAL BOWEL NUTRITION STATUS      Continent Diet (Renal carb modified, 1200 fluid restricition)  AMBULATORY STATUS COMMUNICATION OF NEEDS Skin   Extensive Assist Verbally PU Stage and Appropriate Care (Multiple stage 3, 4, unstagable, and deep tissue injury)                       Personal Care Assistance Level of Assistance    Bathing Assistance: Limited assistance Feeding assistance: Limited assistance Dressing Assistance: Maximum assistance     Functional Limitations Info  Sight, Speech, Hearing Sight Info: Adequate Hearing Info: Adequate Speech Info: Adequate    SPECIAL CARE FACTORS FREQUENCY  OT (By licensed OT), PT (By licensed PT)     PT Frequency: 5x per week OT Frequency: 5x per week            Contractures Contractures Info: Not present    Additional Factors Info  Code Status (Outpatient HD) Code Status Info: DNR Allergies Info: Codeine, Sulfa Antibiotics, Clindamycin/lincomycin, Dilaudid (Hydromorphone Hcl), Penicillins           Current Medications (02/04/2022):  This is the current hospital active medication list  Current Facility-Administered Medications  Medication Dose Route Frequency Provider Last Rate Last Admin   0.9 %  sodium chloride infusion   Intravenous PRN Lorella Nimrod, MD   Stopped at 01/19/22 1845   acetaminophen (TYLENOL) tablet 650 mg  650 mg Oral Q6H PRN Sharion Settler, NP   650 mg at 02/02/22 1057   alteplase (CATHFLO ACTIVASE) injection 2 mg  2 mg Intracatheter Once PRN Schnier, Dolores Lory, MD        ascorbic acid (VITAMIN C) tablet 500 mg  500 mg Oral BID Schnier, Dolores Lory, MD   500 mg at 02/03/22 2241   bisacodyl (DULCOLAX) suppository 10 mg  10 mg Rectal Once Fritzi Mandes, MD       Chlorhexidine Gluconate Cloth 2 % PADS 6 each  6 each Topical Q0600 Schnier, Dolores Lory, MD   6 each at 01/25/22 0544   epoetin alfa (EPOGEN) injection 10,000 Units  10,000 Units Intravenous Q M,W,F-HD Colon Flattery, NP   10,000 Units at 02/04/22 1034   fentaNYL (DURAGESIC) 25 MCG/HR 1 patch  1 patch Transdermal Q72H Fritzi Mandes, MD   1 patch at 02/02/22 1635   fluticasone (FLONASE) 50 MCG/ACT nasal spray 2 spray  2 spray Each Nare Daily Schnier, Dolores Lory, MD   2 spray at 02/04/22 1427   gabapentin (NEURONTIN) capsule 200 mg  200 mg Oral TID Fritzi Mandes, MD   200 mg at 02/04/22 1426   heparin injection 1,000 Units  1,000 Units Dialysis PRN Katha Cabal, MD   4,200 Units at 02/04/22 1304   insulin aspart (novoLOG) injection 0-5 Units  0-5 Units Subcutaneous QHS Katha Cabal, MD   2 Units at 02/03/22 2242   insulin aspart (novoLOG) injection 0-9 Units  0-9 Units Subcutaneous TID WC Schnier, Dolores Lory, MD   3 Units at 02/03/22 1643   insulin glargine-yfgn (SEMGLEE) injection 15 Units  15 Units Subcutaneous BID Fritzi Mandes, MD   15 Units at 02/04/22 1433   leptospermum manuka honey (MEDIHONEY) paste 1 Application  1 Application Topical Daily Nolberto Hanlon, MD   1 Application at 30/09/23 1430   lidocaine-prilocaine (EMLA) cream 1 application   1 application  Topical PRN Schnier, Dolores Lory, MD       loratadine (CLARITIN) tablet 10 mg  10 mg Oral Daily Schnier, Dolores Lory, MD   10 mg at 02/04/22 1429   metoprolol succinate (TOPROL-XL) 24 hr tablet 25 mg  25 mg Oral BID Nolberto Hanlon, MD   25 mg at 02/04/22 1426   multivitamin (RENA-VIT) tablet 1 tablet  1 tablet Oral QHS Schnier, Dolores Lory, MD   1 tablet at 02/03/22 2241   Muscle Rub CREA   Topical PRN Lorella Nimrod, MD       nutrition supplement (JUVEN)  (JUVEN) powder packet 1 packet  1 packet Oral BID BM Schnier, Dolores Lory, MD   1 packet at 02/04/22 1416   ondansetron (ZOFRAN) tablet 4 mg  4 mg Oral Q6H PRN Schnier, Dolores Lory, MD       Or   ondansetron [facility]) injection 4 mg  4 mg Intravenous Q6H PRN Schnier, Dolores Lory, MD   4 mg at 01/04/22 1556   Oral care mouth rinse  15 mL Mouth Rinse PRN Fritzi Mandes, MD       oxyCODONE (Oxy IR/ROXICODONE) 5 MG immediate release tablet            oxyCODONE (Oxy IR/ROXICODONE) immediate release tablet 5 mg  5 mg  Oral Q6H PRN Fritzi Mandes, MD   5 mg at 02/04/22 1143   pentafluoroprop-tetrafluoroeth (GEBAUERS) aerosol 1 application   1 application  Topical PRN Schnier, Dolores Lory, MD       polyethylene glycol (MIRALAX / GLYCOLAX) packet 17 g  17 g Oral Daily Fritzi Mandes, MD   17 g at 02/04/22 1425   protein supplement (ENSURE MAX) liquid  11 oz Oral BID Fritzi Mandes, MD   11 oz at 02/04/22 0830   senna-docusate (Senokot-S) tablet 1 tablet  1 tablet Oral BID Fritzi Mandes, MD   1 tablet at 02/04/22 1427   sodium thiosulfate 25 g in sodium chloride 0.9 % 200 mL Infusion for Calciphylaxis  25 g Intravenous Q M,W,F-HD Colon Flattery, NP 200 mL/hr at 02/04/22 1146 25 g at 02/04/22 1146     Discharge Medications: Please see discharge summary for a list of discharge medications.  Relevant Imaging Results:  Relevant Lab Results:   Additional Information SS [id number]  Beverly Sessions, RN

## 2022-02-04 NOTE — Progress Notes (Signed)
[facility]  PROGRESS NOTE   Subjective:   Patient seen and evaluated during dialysis   HEMODIALYSIS FLOWSHEET:  Blood Flow Rate (mL/min): 400 mL/min Arterial Pressure (mmHg): -170 mmHg Venous Pressure (mmHg): 150 mmHg Transmembrane Pressure (mmHg): 50 mmHg Ultrafiltration Rate (mL/min): 500 mL/min Dialysate Flow Rate (mL/min): 600 ml/min Conductivity: 13.7 Conductivity: 13.7 Dialysis Fluid Bolus: Normal Saline Bolus Amount (mL): 250 mL  Complains of back and sacral pain   Objective:  Vital signs: Blood pressure (!) 124/94, pulse (!) 104, temperature 98.4 F (36.9 C), temperature source Oral, resp. rate 19, height 5\' 3"  (1.6 m), weight 102 kg, SpO2 100 %.  Intake/Output Summary (Last 24 hours) at 02/04/2022 1131 Last data filed at 02/03/2022 1300 Gross per 24 hour  Intake 240 ml  Output --  Net 240 ml    Filed Weights   02/01/22 0100 02/04/22 0500  Weight: 97.4 kg 102 kg     Physical Exam: General:  No acute distress  Head:  Normocephalic, atraumatic. Moist oral mucosal membranes  Eyes:  Anicteric  Lungs:   Clear to auscultation, normal effort  Heart:  S1S2 no rubs  Abdomen:   Soft, nontender, bowel sounds present  Extremities:  Trace right peripheral edema.1+ left peripheral edema  Neurologic:  Awake, alert, following commands  Skin:  Sacral wounds  Access:  Right IJ PermCath    Basic Metabolic Panel: Recent Labs  Lab 01/31/22 0645 02/02/22 1045  NA 134* 132*  K 3.3* 4.0  CL 94* 94*  CO2 23 22  GLUCOSE 130* 167*  BUN 41* 67*  CREATININE 3.03* 4.74*  CALCIUM 9.6 9.3  PHOS 2.0* 3.8     CBC: Recent Labs  Lab 01/31/22 0645 02/02/22 1045  WBC 11.2* 10.6*  HGB 9.2* 8.2*  HCT 28.5* 26.1*  MCV 85.8 87.3  PLT 252 269      Urinalysis: No results for input(s): "COLORURINE", "LABSPEC", "PHURINE", "GLUCOSEU", "HGBUR", "BILIRUBINUR", "KETONESUR", "PROTEINUR", "UROBILINOGEN", "NITRITE", "LEUKOCYTESUR" in the last 72 hours.  Invalid  input(s): "APPERANCEUR"    Imaging: No results found.   Medications:    sodium chloride Stopped (01/19/22 1845)   sodium thiosulfate 25 g in sodium chloride 0.9 % 200 mL Infusion for Calciphylaxis Stopped (02/02/22 1414)    vitamin C  500 mg Oral BID   bisacodyl  10 mg Rectal Once   Chlorhexidine Gluconate Cloth  6 each Topical Q0600   epoetin (EPOGEN/PROCRIT) injection  10,000 Units Intravenous Q M,W,F-HD   fentaNYL  1 patch Transdermal Q72H   fluticasone  2 spray Each Nare Daily   gabapentin  200 mg Oral TID   heparin sodium (porcine)       insulin aspart  0-5 Units Subcutaneous QHS   insulin aspart  0-9 Units Subcutaneous TID WC   insulin glargine-yfgn  15 Units Subcutaneous BID   leptospermum manuka honey  1 Application Topical Daily   loratadine  10 mg Oral Daily   metoprolol succinate  25 mg Oral BID   multivitamin  1 tablet Oral QHS   nutrition supplement (JUVEN)  1 packet Oral BID BM   polyethylene glycol  17 g Oral Daily   Ensure Max Protein  11 oz Oral BID   senna-docusate  1 tablet Oral BID    Assessment/ Plan:     Principal Problem:   Severe sepsis (HCC) Active Problems:   OSA (obstructive sleep apnea)   Chronic kidney disease, stage 5 ([place])   Essential hypertension   DNR (do not resuscitate)/DNI(Do  Not Intubate)   Type 2 diabetes mellitus with chronic kidney disease, with long-term current use of insulin (HCC)   Hypomagnesemia   Pressure injury of skin   Symptomatic anemia   Elevated LFTs   Sacral wound, initial encounter   Swelling of left lower extremity   Abdominal pain   AKI (acute kidney injury) ([place])   Hyponatremia   Premature atrial complexes   Anasarca associated with disorder of kidney   Calciphylaxis   ESRD (end stage renal disease) ([place])  66 year old female with history of hypertension, diabetes, peripheral vascular disease, hyperlipidemia, now with sepsis and acute kidney injury on the top of chronic kidney disease.  Patient is reached  end-stage renal disease and was started on renal replacement therapy.   2D echo from January 03, 2022-:LVEF 60 to 65%, no regional wall motion abnormalities, asymmetric left ventricular hypertrophy of the basal septal segment.  Diastolic parameters normal.   #: End-stage renal disease on dialysis:  Tolerating dialysis seated in chair. Renal navigator seeking outpatient clinic.    #: Sepsis/fever likely from sacral wound. Biopsy negative for calciphylaxis. Prescribed Meropenem, with plans to hold. Negative pressure wound VAC remains in place and managed by surgery team.  Sodium thiosulfate on dialysis treatment days.   #: Anemia secondary to chronic kidney disease complicated by sepsis. Has received blood transfusions during this admission.  Lab Results  Component Value Date   HGB 8.2 (L) 02/02/2022  Continue Epogen 10,000 IV with dialysis treatments.   #: Secondary hyperparathyroidism:  Calcium and phosphorus are within acceptable range.   # Diabetes with CKD Lab Results  Component Value Date   HGBA1C 10.0 (H) 12/18/2021    Primary team to manage SSI    LOS: [facility] 7/5/202311:31 AM

## 2022-02-04 NOTE — TOC Progression Note (Signed)
Transition of Care [facility]) - Progression Note    Patient Details  Name: Liliana Dang MRN: [id number] Date of Birth: [birth year]  Transition [facility]) CM/SW Contact  Beverly Sessions, RN Phone Number: 02/04/2022, 3:14 PM  Clinical Narrative:     -Patient sitting for HD - Outpatient HD has not been secured at this time - Extensive conversation with patient, myself and HD coordinator.  This morning it took 2 people to assist  patient from bed to HD recliner.  Patient wishes she didn't need rehab, but after discussion patient understands that it is going to be difficult for her to be able to manage her care, wound care, and get to HD 3 times a week - Patient agreeable to bed search, but said under no circumstances will she go back to [facility].  She prefers [facility]  - [facility] started.  [facility] and [facility] were able to offer a bed last month so I have reached out to them to see if they can still offer, as well as send out the referral in the [place] all of the other locations except for [facility]    Expected Discharge Plan: [place] Barriers to Discharge: Continued Medical Work up  Expected Discharge Plan and Services Expected Discharge Plan: [place]   Discharge Planning Services: CM Consult Post Acute Care Choice:  (TBD) Living arrangements for the past 2 months: Single Family Home                                       Social Determinants of Health (SDOH) Interventions    Readmission Risk Interventions     No data to display

## 2022-02-04 NOTE — Progress Notes (Signed)
Hemodialysis Post Treatment Note:  Tx date:02/04/2022 Tx time: 3 hours Access: right CVC UF Removed: 1 L  Note: HD treatment completed. Tolerated well. No HD complications noted.post HD BP 149/89.

## 2022-02-05 DIAGNOSIS — Z7189 Other specified counseling: Secondary | ICD-10-CM | POA: Diagnosis not present

## 2022-02-05 DIAGNOSIS — R6520 Severe sepsis without septic shock: Secondary | ICD-10-CM | POA: Diagnosis not present

## 2022-02-05 DIAGNOSIS — A419 Sepsis, unspecified organism: Secondary | ICD-10-CM | POA: Diagnosis not present

## 2022-02-05 LAB — GLUCOSE, CAPILLARY
Glucose-Capillary: 178 mg/dL — ABNORMAL HIGH (ref 70–99)
Glucose-Capillary: 104 mg/dL — ABNORMAL HIGH (ref 70–99)
Glucose-Capillary: 222 mg/dL — ABNORMAL HIGH (ref 70–99)
Glucose-Capillary: 112 mg/dL — ABNORMAL HIGH (ref 70–99)

## 2022-02-05 MED ORDER — INSULIN ASPART 100 UNIT/ML IJ SOLN
3.0000 [IU] | Freq: Three times a day (TID) | INTRAMUSCULAR | Status: DC
  Administered 2022-02-06 – 2022-02-10 (×8): 3 [IU] via SUBCUTANEOUS
  Filled 2022-02-05 (×8): qty 1

## 2022-02-05 NOTE — Progress Notes (Signed)
Physical Therapy Evaluation Patient Details Name: Theresa Warren MRN: [id number] DOB: [birth year] Today's Date: 02/05/2022  History of Present Illness  [name] is a 66 year old female admitted for evaluation of low sodium noted on labs at the facility.  Pt was discharged  to rehab on 5/23 after being admitted following a fall. PMH significant for acute on chronic anemia. hyperlipidemia, hypertension, CKD stage V, insulin-dependent diabetes mellitus.   Clinical Impression  PT re-evaluation conducted this date due to pt extended stay in hospital. Completed as a co-treat with OT. Pt agreeable to transfer to chair to sit up for eating breakfast. Pt completed bed mobility with supervision and use of bed rail for assistance. Pt able to scoot forward to EOB to place feet on floor with supervision. Pt stated she was feeling whoozy from the medication she was given earlier in the morning, but no LOB in sitting. Pt required Mod Ax2 to stand with RW and encouragement. Pt able to maintain static balance in standing with bilat UE support on RW and min Ax1 by PT as OT completed total assist for pericare. Pt completed step pivot transfer to chair with good management of RW, verbal cues for sequencing, and CGAx2. PT completed bilat LE MMT screen with pt grossly 4/5 for knee ext, knee flex, and ankle DF in sitting. Pt able to scoot hips back in chair with verbal cues and scooting one hip back at a time. Would benefit from continued skilled PT to promote optimal return to PLOF. Continue to recommend STR at discharge.   Recommendations for follow up therapy are one component of a multi-disciplinary discharge planning process, led by the attending physician.  Recommendations may be updated based on patient status, additional functional criteria and insurance authorization.  Follow Up Recommendations Skilled nursing-short term rehab (<3 hours/day) Can patient physically be transported by private vehicle:  No    Assistance Recommended at Discharge Frequent or constant Supervision/Assistance  Patient can return home with the following  Two people to help with walking and/or transfers;Two people to help with bathing/dressing/bathroom;Assistance with cooking/housework;Assist for transportation;Help with stairs or ramp for entrance    Equipment Recommendations Rolling walker (2 wheels);BSC/3in1;Wheelchair cushion (measurements PT);Wheelchair (measurements PT);Hospital bed  Recommendations for Other Services       Functional Status Assessment Patient has had a recent decline in their functional status and demonstrates the ability to make significant improvements in function in a reasonable and predictable amount of time.     Precautions / Restrictions Precautions Precautions: Fall Precaution Comments: sacral & leg wounds Restrictions Weight Bearing Restrictions: No      Mobility  Bed Mobility Overal bed mobility: Needs Assistance Bed Mobility: Supine to Sit     Supine to sit: Supervision, HOB elevated     General bed mobility comments: pt able to transfer supine to sit with increased time and effort, use of bed rail, and supervision for safety; pt able to scoot to EOB to place feet on floor with Supervision    Transfers Overall transfer level: Needs assistance Equipment used: Rolling walker (2 wheels) Transfers: Sit to/from Stand, Bed to chair/wheelchair/BSC Sit to Stand: Mod assist, +2 physical assistance   Step pivot transfers: +2 physical assistance, Min guard       General transfer comment: verbal cues for hand placement and technique to sit in chair; pt demonstrated safe use of RW during step pivot transfer this date    Ambulation/Gait  General Gait Details: NT this date; pt transferred to recliner for breakfast  Stairs            Wheelchair Mobility    Modified Rankin (Stroke Patients Only)       Balance Overall balance assessment:  Needs assistance Sitting-balance support: Single extremity supported, Feet supported Sitting balance-Leahy Scale: Fair Sitting balance - Comments: Pt able to sit EOB, scoot forward to place feet on the floor, withno LOB. Pt able to lift UE for placement of gait belt without LOB   Standing balance support: Bilateral upper extremity supported, During functional activity, Reliant on assistive device for balance Standing balance-Leahy Scale: Fair Standing balance comment: able to maintain static standing balance during pericare with +1 assist, requires CGA +2 with bilat UE on RW for dynamic balance during step pivot transfer                             Pertinent Vitals/Pain Pain Assessment Pain Assessment: Faces Pain Score: 0-No pain    Home Living Family/patient expects to be discharged to:: Private residence Living Arrangements: Alone   Type of Home: House Home Access: Stairs to enter Entrance Stairs-Rails: Right Entrance Stairs-Number of Steps: 4-5   Home Layout: Two level;Able to live on main level with bedroom/bathroom Home Equipment: Cane - single point      Prior Function Prior Level of Function : Independent/Modified Independent;Driving             Mobility Comments: Per initial therapy eval "Pt reports she was amb with RW around STR with MOD I; prior to admission prior to fall was amb with cane, +fall history" ADLs Comments: Per initial therapy evaluation "assist for ADLs in rehab, was trialing use of AE; prior to previous admission was indep in ADL/IADL per pt report"     Hand Dominance        Extremity/Trunk Assessment   Upper Extremity Assessment Upper Extremity Assessment: Overall WFL for tasks assessed    Lower Extremity Assessment Lower Extremity Assessment: Overall WFL for tasks assessed (Gross MMT bilat LE 4/5 knee ext, knee flex, ankle DF.)       Communication   Communication: No difficulties  Cognition Arousal/Alertness:  Awake/alert Behavior During Therapy: WFL for tasks assessed/performed Overall Cognitive Status: No family/caregiver present to determine baseline cognitive functioning                                 General Comments: Pt reported feeling whoozy from pain medication taken this morning, slow to process but follows simple commands with verbal cues.        General Comments      Exercises     Assessment/Plan    PT Assessment Patient needs continued PT services  PT Problem List Decreased strength;Pain;Cardiopulmonary status limiting activity;Decreased activity tolerance;Decreased knowledge of use of DME;Decreased balance;Decreased mobility;Decreased skin integrity       PT Treatment Interventions DME instruction;Therapeutic exercise;Gait training;Balance training;Stair training;Functional mobility training;Therapeutic activities;Patient/family education    PT Goals (Current goals can be found in the Care Plan section)  Acute Rehab PT Goals Patient Stated Goal: to improve strength and mobility PT Goal Formulation: With patient Time For Goal Achievement: 02/19/22 Potential to Achieve Goals: Fair    Frequency Min 2X/week     Co-evaluation PT/OT/SLP Co-Evaluation/Treatment: Yes Reason for Co-Treatment: Necessary to address cognition/behavior during functional activity;For patient/therapist safety;To address functional/ADL transfers  PT goals addressed during session: Mobility/safety with mobility;Proper use of DME;Balance;Strengthening/ROM OT goals addressed during session: ADL's and self-care;Proper use of Adaptive equipment and DME       AM-PAC PT "6 Clicks" Mobility  Outcome Measure Help needed turning from your back to your side while in a flat bed without using bedrails?: A Little Help needed moving from lying on your back to sitting on the side of a flat bed without using bedrails?: A Little Help needed moving to and from a bed to a chair (including a  wheelchair)?: A Lot Help needed standing up from a chair using your arms (e.g., wheelchair or bedside chair)?: A Lot Help needed to walk in hospital room?: A Lot Help needed climbing 3-5 steps with a railing? : Total 6 Click Score: 13    End of Session Equipment Utilized During Treatment: Gait belt Activity Tolerance: Patient tolerated treatment well Patient left: in chair;with call bell/phone within reach;with chair alarm set;Other (comment) (with pillow under bilat LEs to elevate heels) Nurse Communication: Mobility status;Precautions PT Visit Diagnosis: Unsteadiness on feet (R26.81);Muscle weakness (generalized) (M62.81);Difficulty in walking, not elsewhere classified (R26.2)    Time: 4099-2780 PT Time Calculation (min) (ACUTE ONLY): 17 min   Charges:              Breyer Tejera, SPT  Sanay [place] 02/05/2022, 11:06 AM

## 2022-02-05 NOTE — TOC Progression Note (Signed)
Transition of Care [facility]) - Progression Note    Patient Details  Name: Theresa Warren MRN: [id number] Date of Birth: 09/14/1955  Transition of Care [facility]) CM/SW [place], LCSW Phone Number: 02/05/2022, 1:55 PM  Clinical Narrative:   Received call back from [place]. She is unable to make SNF decision based on online review and plans to tour facilities tomorrow.  Expected Discharge Plan: [place] Barriers to Discharge: Continued Medical Work up  Expected Discharge Plan and Services Expected Discharge Plan: [place]   Discharge Planning Services: CM Consult Post Acute Care Choice:  (TBD) Living arrangements for the past 2 months: Single Family Home                                       Social Determinants of Health (SDOH) Interventions    Readmission Risk Interventions     No data to display

## 2022-02-05 NOTE — Progress Notes (Signed)
Daily Progress Note   Patient Name: Theresa Warren       Date: 02/04/2022 DOB: 1956/02/20  Age: 66 y.o. MRN#: [id number] Attending Physician: Enzo Bi, MD Primary Care Physician: Crist Infante, MD Admit Date: 12/31/2021  Reason for Consultation/Follow-up: Establishing goals of care  Subjective: Notes reviewed. Patient is working towards discharge and is tolerating dialysis. She understands the importance of working with PT/OT. She states her cousins will help her at home, but will not be present at all times. She states she has considered [facility]. She understands she may need SNF placement and states she just wants anything but [facility].   Recommend outpatient palliative.   Length of Stay: 34  Current Medications: Scheduled Meds:   vitamin C  500 mg Oral BID   bisacodyl  10 mg Rectal Once   Chlorhexidine Gluconate Cloth  6 each Topical Q0600   epoetin (EPOGEN/PROCRIT) injection  10,000 Units Intravenous Q M,W,F-HD   fentaNYL  1 patch Transdermal Q72H   fluticasone  2 spray Each Nare Daily   gabapentin  200 mg Oral TID   insulin aspart  0-5 Units Subcutaneous QHS   insulin aspart  0-9 Units Subcutaneous TID WC   insulin glargine-yfgn  15 Units Subcutaneous BID   leptospermum manuka honey  1 Application Topical Daily   loratadine  10 mg Oral Daily   metoprolol succinate  25 mg Oral BID   multivitamin  1 tablet Oral QHS   nutrition supplement (JUVEN)  1 packet Oral BID BM   polyethylene glycol  17 g Oral Daily   Ensure Max Protein  11 oz Oral BID   senna-docusate  1 tablet Oral BID    Continuous Infusions:  sodium chloride Stopped (01/19/22 1845)   sodium thiosulfate 25 g in sodium chloride 0.9 % 200 mL Infusion for Calciphylaxis Stopped (02/02/22 1414)    PRN Meds: sodium chloride, acetaminophen,  alteplase, heparin, lidocaine-prilocaine, Muscle Rub, ondansetron **OR** ondansetron (ZOFRAN) IV, mouth rinse, oxyCODONE, pentafluoroprop-tetrafluoroeth  Physical Exam Pulmonary:     Effort: Pulmonary effort is normal.  Neurological:     Mental Status: She is alert.             Vital Signs: BP (!) 125/53 (BP Location: Right Arm)   Pulse 81   Temp 98.8 F (37.1 C)   Resp 15   Ht 5\' 3"  (1.6 m)   Wt 102 kg   SpO2 96%   BMI 39.83 kg/m  SpO2: SpO2: 96 % O2 Device: O2 Device: Room Air O2 Flow Rate: O2 Flow Rate (L/min): 3 L/min  Intake/output summary:  Intake/Output Summary (Last 24 hours) at 02/04/2022 2620 Last data filed at 02/03/2022 1300 Gross per 24 hour  Intake 240 ml  Output --  Net 240 ml   LBM: Last BM Date : 01/23/22 (previously charted) Baseline Weight: Weight: 97 kg Most recent weight: Weight: 102 kg       Palliative Assessment/Data: 50-60%      Patient Active Problem List   Diagnosis Date Noted   ESRD (end stage renal disease) ([place])    Calciphylaxis    Anasarca associated with disorder of kidney 01/05/2022   Premature atrial complexes 01/02/2022  Sacral wound, initial encounter 01/01/2022   Swelling of left lower extremity 01/01/2022   Abdominal pain 01/01/2022   AKI (acute kidney injury) ([place]) 01/01/2022   Hyponatremia 01/01/2022   Symptomatic anemia 12/31/2021   Elevated LFTs 12/31/2021   Severe sepsis ([place]) 12/31/2021   Hypokalemia 12/21/2021   Pressure injury of skin 12/20/2021   Hypomagnesemia 12/19/2021   Acute blood loss anemia 12/19/2021   Abnormal vaginal bleeding 12/19/2021   Uncontrolled type 2 diabetes mellitus with hyperglycemia, with long-term current use of insulin ([place]) 12/19/2021   Chronic kidney disease, stage 5 ([place]) 12/18/2021   Chronic pain syndrome 12/18/2021   Fall at home 12/18/2021   DNR (do not resuscitate)/DNI(Do Not Intubate) 12/18/2021   Type 2 diabetes mellitus with chronic kidney disease, with long-term current use  of insulin ([place]) 12/18/2021   Anemia of renal disease 12/18/2021   Long term (current) use of insulin ([place]) 08/30/2018   OSA (obstructive sleep apnea) 11/27/2014   Sleep disturbance 10/10/2014   Severe obesity (BMI >= 40) ([place]) 10/10/2014   Palpitation 09/11/2014   Dyspnea 09/11/2014   Morbid (severe) obesity due to excess calories ([place]) 07/01/2010   Essential hypertension 06/06/2009    Palliative Care Assessment & Plan     Recommendations/Plan:  DNR/DNI.  Patient amenable to outpatient dialysis.  HPOA forms have been completed by spiritual care and scanned into ACP tab.   Recommend outpatient palliative. If needed, please see note from yesterday 7/4 for pain recommendations. PMT will shadow moving forward.   Code Status:    Code Status Orders  (From admission, onward)           Start     Ordered   12/31/21 2333  Do not attempt resuscitation (DNR)  Continuous       Question Answer Comment  In the event of cardiac or respiratory ARREST Do not call a "code blue"   In the event of cardiac or respiratory ARREST Do not perform Intubation, CPR, defibrillation or ACLS   In the event of cardiac or respiratory ARREST Use medication by any route, position, wound care, and other measures to relive pain and suffering. May use oxygen, suction and manual treatment of airway obstruction as needed for comfort.   Comments verified with patient      12/31/21 2332           Code Status History     Date Active Date Inactive Code Status Order ID Comments User Context   12/18/2021 1711 12/23/2021 1816 DNR [id number]  Kristopher Oppenheim, DO Inpatient   12/18/2021 1432 12/18/2021 1711 DNR [id number]  Kristopher Oppenheim, DO ED      Advance Directive Documentation    Flowsheet Row Most Recent Value  Type of Advance Directive Out of facility DNR (pink MOST or yellow form)  Pre-existing out of facility DNR order (yellow form or pink MOST form) --  "MOST" Form in Place? --       Thank you for  allowing the Palliative Medicine Team to assist in the care of this patient.   Asencion Gowda, NP  Please contact Palliative Medicine Team phone at [phone] for questions and concerns.

## 2022-02-05 NOTE — Progress Notes (Signed)
[facility] NAME: Theresa Warren    MR#:  [id number]  DATE OF BIRTH:  11/20/1955  SUBJECTIVE:  No new complaints.  TOC and dialysis coordinator working together to set up pt for outpatient dialysis and SNF rehab.   VITALS:  Blood pressure (!) 132/57, pulse 93, temperature 98.5 F (36.9 C), temperature source Oral, resp. rate 19, height 5\' 3"  (1.6 m), weight 104 kg, SpO2 98 %.  PHYSICAL EXAMINATION:   Constitutional: NAD, alert, sitting in recliner HEENT: conjunctivae and lids normal, EOMI CV: No cyanosis.   RESP: normal respiratory effort, on RA Neuro: II - XII grossly intact.     LABORATORY PANEL:  CBC Recent Labs  Lab 02/04/22 1742  WBC 11.5*  HGB 9.2*  HCT 29.5*  PLT 262    Chemistries  Recent Labs  Lab 02/04/22 1742  NA 138  K 3.9  CL 96*  CO2 26  GLUCOSE 230*  BUN 25*  CREATININE 2.48*  CALCIUM 9.1    Assessment and Plan Theresa Warren is a 66 year old female hyperlipidemia, hypertension, CKD stage V, insulin-dependent diabetes mellitus, who presents emergency department from [facility] [place] for evaluation of low sodium noted on labs at the facility.  She was discharged to rehab on 5/23 after being admitted following a fall and also with acute on chronic anemia.  Patient has been here for last 27 days. Please review detailed progress note in [facility] if needed.  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 Steva Ready has messaged dermatology to discuss and give curbside consultation -- currently off antibiotic Plan: --continue aggressive wound care --discussed pain management--for now cont fentanyl patch +oxycodone.    Bilateral sacral wounds -- status post debridement with surgery --cont dressing change per order  End-stage renal disease on hemodialysis -- She is having a very hard time sitting up for dialysis given her morbid obesity significant pain from her skin lesions --need to set up outpatient dialysis before discharge  generalized weakness, deconditioning --pt decided to consider SNF rehab on 7/5  anemia of chronic disease in the setting of ESRD -- status post two unit blood transfusion -- no evidence of any active bleeding. -- No history of vaginally bleeding. -- CT scan of the abdomen showed abnormal endometrial thickening for postmenopausal woman ---outpatient f/u with Gyn --Epo with dialysis  hypertension  --continue metoprolol  Type 2 Dm with end-stage renal disease, long-term use of insulin --cont glargine 15u BID --add mealtime 3u TID --SSI   Pressure Injury 12/20/21 Buttocks Left Stage 4 - Full thickness tissue loss with exposed bone, tendon or muscle. open area right below coccyx (Active)  12/20/21 0800  Location: Buttocks  Location Orientation: Left  Staging: Stage 4 - Full thickness tissue loss with exposed bone, tendon or muscle.  Wound Description (Comments): open area right below coccyx  Present on Admission: Yes (First day with patient. Unknown if present at admission.)     Pressure Injury 01/01/22 Buttocks Left Stage 3 -  Full thickness tissue loss. Subcutaneous fat may be visible but bone, tendon or muscle are NOT exposed. (Active)  01/01/22 0222  Location: Buttocks  Location Orientation: Left  Staging: Stage 3 -  Full thickness tissue loss. Subcutaneous fat  may be visible but bone, tendon or muscle are NOT exposed.  Wound Description (Comments):   Present on Admission: Yes     Pressure Injury 01/01/22 Buttocks Right Stage 4 - Full thickness tissue loss with exposed bone, tendon or muscle. (Active)  01/01/22 0223  Location: Buttocks  Location Orientation:  Right  Staging: Stage 4 - Full thickness tissue loss with exposed bone, tendon or muscle.  Wound Description (Comments):   Present on Admission: Yes     Pressure Injury 01/01/22 Hip Left Stage 4 - Full thickness tissue loss with exposed bone, tendon or muscle. (Active)  01/01/22 0225  Location: Hip  Location Orientation: Left  Staging: Stage 4 - Full thickness tissue loss with exposed bone, tendon or muscle.  Wound Description (Comments):   Present on Admission: Yes     Pressure Injury 01/01/22 Hip Right Stage 3 -  Full thickness tissue loss. Subcutaneous fat may be visible but bone, tendon or muscle are NOT exposed. (Active)  01/01/22 0225  Location: Hip  Location Orientation: Right  Staging: Stage 3 -  Full thickness tissue loss. Subcutaneous fat may be visible but bone, tendon or muscle are NOT exposed.  Wound Description (Comments):   Present on Admission: Yes     Pressure Injury 01/12/22 Thigh Left;Upper Unstageable - Full thickness tissue loss in which the base of the injury is covered by slough (yellow, tan, gray, green or brown) and/or eschar (tan, brown or black) in the wound bed. previously noted "rugburns"  (Active)  01/12/22   Location: Thigh  Location Orientation: Left;Upper  Staging: Unstageable - Full thickness tissue loss in which the base of the injury is covered by slough (yellow, tan, gray, green or brown) and/or eschar (tan, brown or black) in the wound bed.  Wound Description (Comments): previously noted "rugburns" have evolved  Present on Admission: Yes     Pressure Injury 01/12/22 Thigh Left;Posterior Deep Tissue Pressure Injury - Purple or maroon localized area of discolored intact skin or blood-filled blister due to damage of underlying soft tissue from pressure and/or shear. previously noted " rugburns" (Active)  01/12/22   Location: Thigh  Location Orientation: Left;Posterior  Staging: Deep Tissue Pressure Injury - Purple or maroon localized area of  discolored intact skin or blood-filled blister due to damage of underlying soft tissue from pressure and/or shear.  Wound Description (Comments): previously noted " rugburns" have evolved  Present on Admission: Yes     Pressure Injury 02/05/22 Thigh Right;Lateral;Upper Unstageable - Full thickness tissue loss in which the base of the injury is covered by slough (yellow, tan, gray, green or brown) and/or eschar (tan, brown or black) in the wound bed. (Active)  02/05/22 0500  Location: Thigh  Location Orientation: Right;Lateral;Upper  Staging: Unstageable - Full thickness tissue loss in which the base of the injury is covered by slough (yellow, tan, gray, green or brown) and/or eschar (tan, brown or black) in the wound bed.  Wound Description (Comments):   Present on Admission: Yes    DVT prophylaxis: SCD/Compression stockings Code Status: DNR  Family Communication:  Status is: inpatient Dispo:   The patient is from: home Anticipated d/c is to: SNF Anticipated d/c date is: undetermined Patient currently is not medically stable to d/c due to: outpatient dialysis not set up yet   Enzo Bi M.D    [facility]

## 2022-02-05 NOTE — Inpatient Diabetes Management (Signed)
Inpatient Diabetes Program Recommendations  AACE/ADA: New Consensus Statement on Inpatient Glycemic Control   Target Ranges:  Prepandial:   less than 140 mg/dL      Peak postprandial:   less than 180 mg/dL (1-2 hours)      Critically ill patients:  140 - 180 mg/dL    Latest Reference Range & Units 02/04/22 08:11 02/04/22 14:28 02/04/22 16:20 02/04/22 20:19 02/05/22 07:46  Glucose-Capillary 70 - 99 mg/dL 83 202 (H) 255 (H) 242 (H) 178 (H)   Review of Glycemic Control  Diabetes history:  DM2 Outpatient Diabetes medications: Humulin R U500 30 unit mark on U100 syringe in the morning, Humulin R U500 25 unit mark on U100 syringe in the evening Current orders for Inpatient glycemic control: Semglee 15 units BID, Novolog 0-9 units TID with meals, Novolog 0-5 units QHS  Inpatient Diabetes Program Recommendations:    Insulin: Noted meal coverage was discontinued. Post prandial glucose consistently elevated on 02/04/22.  Please consider ordering Novolog 3 units TID with meals for meal coverage if patient eats at least 50% of meals.  Thanks, Barnie Alderman, RN, MSN, [place] [facility] Coordinator Inpatient Diabetes Program [phone] (Team Pager from 8am to [place])

## 2022-02-05 NOTE — TOC Progression Note (Signed)
Transition of Care [facility]) - Progression Note    Patient Details  Name: Theresa Warren MRN: [id number] Date of Birth: Oct 19, 1955  Transition of [facility]) CM/SW Contact  Beverly Sessions, RN Phone Number: 02/05/2022, 10:23 AM  Clinical Narrative:    Bed offers presented Will follow up for decision at 2pm VM left for POA Susan per patient request   Expected Discharge Plan: [place] Barriers to Discharge: Continued Medical Work up  Expected Discharge Plan and Services Expected Discharge Plan: [place]   Discharge Planning Services: CM Consult Post Acute Care Choice:  (TBD) Living arrangements for the past 2 months: Single Family Home                                       Social Determinants of Health (SDOH) Interventions    Readmission Risk Interventions     No data to display

## 2022-02-05 NOTE — Progress Notes (Signed)
Occupational Therapy Treatment Patient Details Name: Kenedie Dirocco MRN: [id number] DOB: April 12, 1956 Today's Date: 02/05/2022   History of present illness Ms. Chane Magner is a 66 year old female admitted for evaluation of low sodium noted on labs at the facility.  Pt was discharged  to rehab on 5/23 after being admitted following a fall. PMH significant for acute on chronic anemia. hyperlipidemia, hypertension, CKD stage V, insulin-dependent diabetes mellitus.   OT comments  Pt seen for OT tx and co-tx with PT. Pt agreeable with encouragement. Pt completed bed mobility with supervision and use of bed rail. Endorsed feeling a bit whoozy and cites pain meds given around 4:17am as reason. Pt sat EOB with supervision and no LOB. Pt stood with MODA +2 and VC for hand placement/RW mgt. Once in standing pt able to maintain static standing balance with +1 assist from PT and required MAX for pericare. Pt able to complete step pivot to recliner with CGA+2 and VC for sequencing to improve safety with descent. Pt left with PT for additional assessment. Pt progressing towards goals, continues to benefit from skilled OT services.    Recommendations for follow up therapy are one component of a multi-disciplinary discharge planning process, led by the attending physician.  Recommendations may be updated based on patient status, additional functional criteria and insurance authorization.    Follow Up Recommendations  Skilled nursing-short term rehab (<3 hours/day)    Assistance Recommended at Discharge Frequent or constant Supervision/Assistance  Patient can return home with the following  A lot of help with bathing/dressing/bathroom;Assist for transportation;Help with stairs or ramp for entrance;Two people to help with walking and/or transfers   Equipment Recommendations  Wheelchair cushion (measurements OT);Wheelchair (measurements OT);BSC/3in1;Hospital bed    Recommendations for Other Services       Precautions / Restrictions Precautions Precautions: Fall Precaution Comments: sacral & leg wounds Restrictions Weight Bearing Restrictions: No       Mobility Bed Mobility   Bed Mobility: Supine to Sit     Supine to sit: Supervision, HOB elevated     General bed mobility comments: supv for safety, increased time/effort, use of bed rail    Transfers Overall transfer level: Needs assistance Equipment used: Rolling walker (2 wheels) Transfers: Sit to/from Stand Sit to Stand: Mod assist, +2 physical assistance     Step pivot transfers: +2 physical assistance, Min guard     General transfer comment: VC for sequencing before descent into recliner     Balance Overall balance assessment: Needs assistance Sitting-balance support: Single extremity supported, Feet supported Sitting balance-Leahy Scale: Fair     Standing balance support: Bilateral upper extremity supported, During functional activity, Reliant on assistive device for balance Standing balance-Leahy Scale: Fair Standing balance comment: able to maintain static standing balance during pericare with +1 assist, requires CGA +2 for dynamic balance                           ADL either performed or assessed with clinical judgement   ADL Overall ADL's : Needs assistance/impaired                         Toilet Transfer: Moderate assistance;+2 for physical assistance;Rolling walker (2 wheels) Toilet Transfer Details (indicate cue type and reason): MOD A +2 to stand from EOB, CGA +2 for step pivot to recliner for simulated toilet transfer, requiring VC for hand placement Toileting- Clothing Manipulation and Hygiene: Maximal assistance;Sit to/from stand  Toileting - Clothing Manipulation Details (indicate cue type and reason): pt required BUE support on the RW to maintain balance, requiring MAX for pericare in standing            Extremity/Trunk Assessment              Vision        Perception     Praxis      Cognition Arousal/Alertness: Awake/alert Behavior During Therapy: WFL for tasks assessed/performed Overall Cognitive Status: No family/caregiver present to determine baseline cognitive functioning                                 General Comments: Pt reports feeling a bit whoozy from pain medication taken earlier (4:17am), slow to process but follows simple commands with VC        Exercises      Shoulder Instructions       General Comments      Pertinent Vitals/ Pain       Pain Assessment Pain Assessment: Faces Faces Pain Scale: No hurt  Home Living                                          Prior Functioning/Environment              Frequency  Min 2X/week        Progress Toward Goals  OT Goals(current goals can now be found in the care plan section)  Progress towards OT goals: Progressing toward goals  Acute Rehab OT Goals Patient Stated Goal: go home OT Goal Formulation: With patient Time For Goal Achievement: 02/12/22 Potential to Achieve Goals: [place] Discharge plan remains appropriate;Frequency remains appropriate    Co-evaluation    PT/OT/SLP Co-Evaluation/Treatment: Yes Reason for Co-Treatment: Necessary to address cognition/behavior during functional activity;For patient/therapist safety;To address functional/ADL transfers PT goals addressed during session: Mobility/safety with mobility;Balance;Proper use of DME OT goals addressed during session: ADL's and self-care;Proper use of Adaptive equipment and DME      AM-PAC OT "6 Clicks" Daily Activity     Outcome Measure   Help from another person eating meals?: None Help from another person taking care of personal grooming?: A Little Help from another person toileting, which includes using toliet, bedpan, or urinal?: A Lot Help from another person bathing (including washing, rinsing, drying)?: A Lot Help from another person to  put on and taking off regular upper body clothing?: A Little Help from another person to put on and taking off regular lower body clothing?: A Lot 6 Click Score: 16    End of Session Equipment Utilized During Treatment: Gait belt;Rolling walker (2 wheels)  OT Visit Diagnosis: Other abnormalities of gait and mobility (R26.89);Muscle weakness (generalized) (M62.81)   Activity Tolerance Patient tolerated treatment well   Patient Left in chair;with call bell/phone within reach;with chair alarm set   Nurse Communication  (NT: new purewick and linens)        Time: 6333-5456 OT Time Calculation (min): 13 min  Charges: OT General Charges $OT Visit: 1 Visit OT Treatments $Self Care/Home Management : 8-22 mins  Ardeth Perfect., MPH, MS, OTR/L ascom [phone] 02/05/22, 9:41 AM

## 2022-02-05 NOTE — Progress Notes (Signed)
Daily Progress Note   Patient Name: Theresa Warren       Date: 02/05/2022 DOB: [birth year]  Age: 66 y.o. MRN#: [id number] Attending Physician: Enzo Bi, MD Primary Care Physician: Crist Infante, MD Admit Date: 12/31/2021  Reason for Consultation/Follow-up: Establishing goals of care  Subjective: Notes reviewed. Patient tolerated chair dialysis yesterday. She is amenable to SNF placement, as long as it is not [facility], and TOC is working on placement. Continue to recommend outpatient palliative to follow over time. PMT will sign off.   Length of Stay: 35  Current Medications: Scheduled Meds:   vitamin C  500 mg Oral BID   bisacodyl  10 mg Rectal Once   Chlorhexidine Gluconate Cloth  6 each Topical Q0600   epoetin (EPOGEN/PROCRIT) injection  10,000 Units Intravenous Q M,W,F-HD   fentaNYL  1 patch Transdermal Q72H   fluticasone  2 spray Each Nare Daily   gabapentin  200 mg Oral TID   insulin aspart  0-5 Units Subcutaneous QHS   insulin aspart  0-9 Units Subcutaneous TID WC   insulin glargine-yfgn  15 Units Subcutaneous BID   leptospermum manuka honey  1 Application Topical Daily   loratadine  10 mg Oral Daily   metoprolol succinate  25 mg Oral BID   multivitamin  1 tablet Oral QHS   nutrition supplement (JUVEN)  1 packet Oral BID BM   polyethylene glycol  17 g Oral Daily   Ensure Max Protein  11 oz Oral BID   senna-docusate  1 tablet Oral BID    Continuous Infusions:  sodium chloride Stopped (01/19/22 1845)   sodium thiosulfate 25 g in sodium chloride 0.9 % 200 mL Infusion for Calciphylaxis 25 g (02/04/22 1146)    PRN Meds: sodium chloride, acetaminophen, alteplase, heparin, lidocaine-prilocaine, Muscle Rub, ondansetron **OR** ondansetron (ZOFRAN) IV, mouth rinse,  oxyCODONE, pentafluoroprop-tetrafluoroeth  Physical Exam Pulmonary:     Effort: Pulmonary effort is normal.  Neurological:     Mental Status: She is alert.             Vital Signs: BP (!) 120/50 (BP Location: Right Arm)   Pulse 80   Temp 99.4 F (37.4 C)   Resp 15   Ht 5\' 3"  (1.6 m)   Wt 104 kg   SpO2 97%   BMI 40.61  kg/m  SpO2: SpO2: 97 % O2 Device: O2 Device: Room Air O2 Flow Rate: O2 Flow Rate (L/min): 3 L/min  Intake/output summary:  Intake/Output Summary (Last 24 hours) at 02/05/2022 0848 Last data filed at 02/05/2022 0600 Gross per 24 hour  Intake 437 ml  Output 1000 ml  Net -563 ml   LBM: Last BM Date : 02/04/22 Baseline Weight: Weight: 97 kg Most recent weight: Weight: 104 kg       Palliative Assessment/Data: 50%      Patient Active Problem List   Diagnosis Date Noted   ESRD (end stage renal disease) ([place])    Calciphylaxis    Anasarca associated with disorder of kidney 01/05/2022   Premature atrial complexes 01/02/2022   Sacral wound, initial encounter 01/01/2022   Swelling of left lower extremity 01/01/2022   Abdominal pain 01/01/2022   AKI (acute kidney injury) ([place]) 01/01/2022   Hyponatremia 01/01/2022   Symptomatic anemia 12/31/2021   Elevated LFTs 12/31/2021   Severe sepsis ([place]) 12/31/2021   Hypokalemia 12/21/2021   Pressure injury of skin 12/20/2021   Hypomagnesemia 12/19/2021   Acute blood loss anemia 12/19/2021   Abnormal vaginal bleeding 12/19/2021   Uncontrolled type 2 diabetes mellitus with hyperglycemia, with long-term current use of insulin ([place]) 12/19/2021   Chronic kidney disease, stage 5 ([place]) 12/18/2021   Chronic pain syndrome 12/18/2021   Fall at home 12/18/2021   DNR (do not resuscitate)/DNI(Do Not Intubate) 12/18/2021   Type 2 diabetes mellitus with chronic kidney disease, with long-term current use of insulin ([place]) 12/18/2021   Anemia of renal disease 12/18/2021   Long term (current) use of insulin ([place]) 08/30/2018   OSA  (obstructive sleep apnea) 11/27/2014   Sleep disturbance 10/10/2014   Severe obesity (BMI >= 40) ([place]) 10/10/2014   Palpitation 09/11/2014   Dyspnea 09/11/2014   Morbid (severe) obesity due to excess calories ([place]) 07/01/2010   Essential hypertension 06/06/2009    Palliative Care Assessment & Plan     Recommendations/Plan: Recommend outpatient palliative to follow over time. If recommendations on pain management are needed, please see 7/4 note for further. PMT will sign off. Please call for needs.    Code Status:    Code Status Orders  (From admission, onward)           Start     Ordered   12/31/21 2333  Do not attempt resuscitation (DNR)  Continuous       Question Answer Comment  In the event of cardiac or respiratory ARREST Do not call a "code blue"   In the event of cardiac or respiratory ARREST Do not perform Intubation, CPR, defibrillation or ACLS   In the event of cardiac or respiratory ARREST Use medication by any route, position, wound care, and other measures to relive pain and suffering. May use oxygen, suction and manual treatment of airway obstruction as needed for comfort.   Comments verified with patient      12/31/21 2332           Code Status History     Date Active Date Inactive Code Status Order ID Comments User Context   12/18/2021 1711 12/23/2021 1816 DNR [id number]  Kristopher Oppenheim, DO Inpatient   12/18/2021 1432 12/18/2021 1711 DNR [id number]  Kristopher Oppenheim, DO ED      Advance Directive Documentation    Flowsheet Row Most Recent Value  Type of Advance Directive Out of facility DNR (pink MOST or yellow form)  Pre-existing out of facility DNR order (yellow  form or pink MOST form) --  "MOST" Form in Place? --      Thank you for allowing the Palliative Medicine Team to assist in the care of this patient.   Asencion Gowda, NP  Please contact Palliative Medicine Team phone at [phone] for questions and concerns.

## 2022-02-05 NOTE — Progress Notes (Signed)
[facility]  PROGRESS NOTE   Subjective:   Patient sitting up in bed Awaiting breakfast Complains of sacral discomfort from sitting on hard surface.   Dialysis yesterday, tolerated well   Objective:  Vital signs: Blood pressure (!) 120/50, pulse 80, temperature 99.4 F (37.4 C), resp. rate 15, height 5\' 3"  (1.6 m), weight 104 kg, SpO2 97 %.  Intake/Output Summary (Last 24 hours) at 02/05/2022 1045 Last data filed at 02/05/2022 0600 Gross per 24 hour  Intake 200 ml  Output 1000 ml  Net -800 ml    Filed Weights   02/04/22 0500 02/05/22 0421  Weight: 102 kg 104 kg     Physical Exam: General:  No acute distress  Head:  Normocephalic, atraumatic. Moist oral mucosal membranes  Eyes:  Anicteric  Lungs:   Clear to auscultation, normal effort  Heart:  S1S2 no rubs  Abdomen:   Soft, nontender, bowel sounds present  Extremities:  Trace right peripheral edema.1+ left peripheral edema  Neurologic:  Awake, alert, following commands  Skin:  Sacral wounds  Access:  Right IJ PermCath    Basic Metabolic Panel: Recent Labs  Lab 01/31/22 0645 02/02/22 1045 02/04/22 1742  NA 134* 132* 138  K 3.3* 4.0 3.9  CL 94* 94* 96*  CO2 23 22 26   GLUCOSE 130* 167* 230*  BUN 41* 67* 25*  CREATININE 3.03* 4.74* 2.48*  CALCIUM 9.6 9.3 9.1  PHOS 2.0* 3.8 1.7*     CBC: Recent Labs  Lab 01/31/22 0645 02/02/22 1045 02/04/22 1742  WBC 11.2* 10.6* 11.5*  HGB 9.2* 8.2* 9.2*  HCT 28.5* 26.1* 29.5*  MCV 85.8 87.3 86.5  PLT 252 269 262      Urinalysis: No results for input(s): "COLORURINE", "LABSPEC", "PHURINE", "GLUCOSEU", "HGBUR", "BILIRUBINUR", "KETONESUR", "PROTEINUR", "UROBILINOGEN", "NITRITE", "LEUKOCYTESUR" in the last 72 hours.  Invalid input(s): "APPERANCEUR"    Imaging: No results found.   Medications:    sodium chloride Stopped (01/19/22 1845)   sodium thiosulfate 25 g in sodium chloride 0.9 % 200 mL Infusion for Calciphylaxis 25 g (02/04/22 1146)     vitamin C  500 mg Oral BID   bisacodyl  10 mg Rectal Once   Chlorhexidine Gluconate Cloth  6 each Topical Q0600   epoetin (EPOGEN/PROCRIT) injection  10,000 Units Intravenous Q M,W,F-HD   fentaNYL  1 patch Transdermal Q72H   fluticasone  2 spray Each Nare Daily   gabapentin  200 mg Oral TID   insulin aspart  0-5 Units Subcutaneous QHS   insulin aspart  0-9 Units Subcutaneous TID WC   insulin glargine-yfgn  15 Units Subcutaneous BID   leptospermum manuka honey  1 Application Topical Daily   loratadine  10 mg Oral Daily   metoprolol succinate  25 mg Oral BID   multivitamin  1 tablet Oral QHS   nutrition supplement (JUVEN)  1 packet Oral BID BM   polyethylene glycol  17 g Oral Daily   Ensure Max Protein  11 oz Oral BID   senna-docusate  1 tablet Oral BID    Assessment/ Plan:     Principal Problem:   Severe sepsis (HCC) Active Problems:   OSA (obstructive sleep apnea)   Chronic kidney disease, stage 5 ([place])   Essential hypertension   DNR (do not resuscitate)/DNI(Do Not Intubate)   Type 2 diabetes mellitus with chronic kidney disease, with long-term current use of insulin (HCC)   Hypomagnesemia   Pressure injury of skin   Symptomatic anemia   Elevated  LFTs   Sacral wound, initial encounter   Swelling of left lower extremity   Abdominal pain   AKI (acute kidney injury) ([place])   Hyponatremia   Premature atrial complexes   Anasarca associated with disorder of kidney   Calciphylaxis   ESRD (end stage renal disease) ([place])  66 year old female with history of hypertension, diabetes, peripheral vascular disease, hyperlipidemia, now with sepsis and acute kidney injury on the top of chronic kidney disease.  Patient is reached end-stage renal disease and was started on renal replacement therapy.   2D echo from January 03, 2022-:LVEF 60 to 65%, no regional wall motion abnormalities, asymmetric left ventricular hypertrophy of the basal septal segment.  Diastolic parameters normal.   #:  End-stage renal disease on dialysis:  Dialysis received yesterday, UF 1L achieved. Renal navigator seeking outpatient clinic.    #: Sepsis/fever likely from sacral wound. Biopsy negative for calciphylaxis. Prescribed Meropenem, with plans to hold. Negative pressure wound VAC remains in place and managed by surgery team.  Sodium thiosulfate on dialysis treatments   #: Anemia secondary to chronic kidney disease complicated by sepsis. Has received blood transfusions during this admission.  Lab Results  Component Value Date   HGB 9.2 (L) 02/04/2022  Epogen with dialysis treatments.   #: Secondary hyperparathyroidism:  Calcium at goal. Phosphorus below target. Continue protein supplements.   # Diabetes with CKD Lab Results  Component Value Date   HGBA1C 10.0 (H) 12/18/2021  Glucose remains elevated at times   Primary team to manage SSI    LOS: [facility] 7/6/202310:45 AM

## 2022-02-06 ENCOUNTER — Encounter: Payer: Self-pay | Admitting: Internal Medicine

## 2022-02-06 DIAGNOSIS — A419 Sepsis, unspecified organism: Secondary | ICD-10-CM | POA: Diagnosis not present

## 2022-02-06 DIAGNOSIS — R6520 Severe sepsis without septic shock: Secondary | ICD-10-CM | POA: Diagnosis not present

## 2022-02-06 LAB — RENAL FUNCTION PANEL
Albumin: 2.2 g/dL — ABNORMAL LOW (ref 3.5–5.0)
Anion gap: 18 — ABNORMAL HIGH (ref 5–15)
BUN: 45 mg/dL — ABNORMAL HIGH (ref 8–23)
CO2: 23 mmol/L (ref 22–32)
Calcium: 10.3 mg/dL (ref 8.9–10.3)
Chloride: 97 mmol/L — ABNORMAL LOW (ref 98–111)
Creatinine, Ser: 4.23 mg/dL — ABNORMAL HIGH (ref 0.44–1.00)
GFR, Estimated: 11 mL/min — ABNORMAL LOW (ref >60)
Glucose, Bld: 159 mg/dL — ABNORMAL HIGH (ref 70–99)
Phosphorus: 3.1 mg/dL (ref 2.5–4.6)
Potassium: 4.5 mmol/L (ref 3.5–5.1)
Sodium: 138 mmol/L (ref 135–145)

## 2022-02-06 LAB — CBC
HCT: 28.7 % — ABNORMAL LOW (ref 36.0–46.0)
Hemoglobin: 9 g/dL — ABNORMAL LOW (ref 12.0–15.0)
MCH: 27.4 pg (ref 26.0–34.0)
MCHC: 31.4 g/dL (ref 30.0–36.0)
MCV: 87.5 fL (ref 80.0–100.0)
Platelets: 350 10*3/uL (ref 150–400)
RBC: 3.28 MIL/uL — ABNORMAL LOW (ref 3.87–5.11)
RDW: 16.7 % — ABNORMAL HIGH (ref 11.5–15.5)
WBC: 14.4 10*3/uL — ABNORMAL HIGH (ref 4.0–10.5)
nRBC: 0 % (ref 0.0–0.2)

## 2022-02-06 LAB — GLUCOSE, CAPILLARY
Glucose-Capillary: 35 mg/dL — CL (ref 70–99)
Glucose-Capillary: 43 mg/dL — CL (ref 70–99)
Glucose-Capillary: 70 mg/dL (ref 70–99)
Glucose-Capillary: 80 mg/dL (ref 70–99)
Glucose-Capillary: 163 mg/dL — ABNORMAL HIGH (ref 70–99)
Glucose-Capillary: 183 mg/dL — ABNORMAL HIGH (ref 70–99)

## 2022-02-06 LAB — HEPATITIS B SURFACE ANTIGEN: Hepatitis B Surface Ag: NONREACTIVE

## 2022-02-06 MED ORDER — HEPARIN SODIUM (PORCINE) 1000 UNIT/ML IJ SOLN
INTRAMUSCULAR | Status: AC
  Administered 2022-02-06: 4200 [IU] via INTRAVENOUS_CENTRAL
  Filled 2022-02-06: qty 10

## 2022-02-06 MED ORDER — EPOETIN ALFA 10000 UNIT/ML IJ SOLN
INTRAMUSCULAR | Status: AC
  Filled 2022-02-06: qty 1

## 2022-02-06 MED ORDER — INSULIN GLARGINE-YFGN 100 UNIT/ML ~~LOC~~ SOLN
15.0000 [IU] | Freq: Every day | SUBCUTANEOUS | Status: DC
  Administered 2022-02-06 – 2022-02-09 (×4): 15 [IU] via SUBCUTANEOUS
  Filled 2022-02-06 (×5): qty 0.15

## 2022-02-06 MED ORDER — OXYCODONE HCL 5 MG PO TABS
ORAL_TABLET | ORAL | Status: AC
  Filled 2022-02-06: qty 1

## 2022-02-06 NOTE — Inpatient Diabetes Management (Signed)
Inpatient Diabetes Program Recommendations  AACE/ADA: New Consensus Statement on Inpatient Glycemic Control  Target Ranges:  Prepandial:   less than 140 mg/dL      Peak postprandial:   less than 180 mg/dL (1-2 hours)      Critically ill patients:  140 - 180 mg/dL    Latest Reference Range & Units 02/06/22 08:26 02/06/22 08:38  Glucose-Capillary 70 - 99 mg/dL 35 (LL) 43 (LL)    Latest Reference Range & Units 02/05/22 07:46 02/05/22 10:08 02/05/22 12:11 02/05/22 16:20 02/05/22 21:24 02/05/22 22:25  Glucose-Capillary 70 - 99 mg/dL 178 (H)  Novolog 2 units     Semglee  15 units 104 (H) 222 (H)  Novolog 3 units 112 (H)     Semglee  15 units   Review of Glycemic Control  Diabetes history:  DM2 Outpatient Diabetes medications: Humulin R U500 30 unit mark on U100 syringe in the morning, Humulin R U500 25 unit mark on U100 syringe in the evening Current orders for Inpatient glycemic control: Semglee 15 units BID, Novolog 0-9 units TID with meals, Novolog 0-5 units QHS, Novolog 3 units TID with meals   Inpatient Diabetes Program Recommendations:     Insulin: Fasting glucose 35 mg/dl at 8:26 am today and 43 mg/dl at 8:38 am. Please consider decreasing Semglee to 15 units QHS.  Thanks, Barnie Alderman, RN, MSN, [facility] Coordinator Inpatient Diabetes Program [phone] (Team Pager from 8am to [place])

## 2022-02-06 NOTE — Progress Notes (Signed)
[facility] [place] NAME: Theresa Warren    MR#:  [id number]  DATE OF BIRTH:  June 06, 1956  SUBJECTIVE:  Had hypoglycemic events overnight.  Pt reported not feeling well because she was left sitting in chair too long.   VITALS:  Blood pressure (!) 134/59, pulse (!) 106, temperature 99.5 F (37.5 C), temperature source Oral, resp. rate 16, height 5\' 3"  (1.6 m), weight 104 kg, SpO2 97 %.  PHYSICAL EXAMINATION:   Constitutional: NAD, AAOx3, sitting up in bed HEENT: conjunctivae and lids normal, EOMI CV: No cyanosis.   RESP: normal respiratory effort, on RA Neuro: II - XII grossly intact.   Psych: depressed mood and affect.     LABORATORY PANEL:  CBC Recent Labs  Lab 02/06/22 1332  WBC 14.4*  HGB 9.0*  HCT 28.7*  PLT 350    Chemistries  Recent Labs  Lab 02/06/22 1332  NA 138  K 4.5  CL 97*  CO2 23  GLUCOSE 159*  BUN 45*  CREATININE 4.23*  CALCIUM 10.3    Assessment and Plan Kolby Myung is a 66 year old female hyperlipidemia, hypertension, CKD stage V, insulin-dependent diabetes mellitus, who presents emergency department from [facility] for evaluation of low sodium noted on labs at the facility.  She was discharged to rehab on 5/23 after being admitted following a fall and also with acute on chronic anemia.  Patient has been here for last 27 days. Please review detailed progress note in [facility] if needed.  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 Steva Ready has messaged dermatology to discuss and give curbside consultation -- currently off antibiotic Plan: --continue aggressive wound care --discussed pain management--for now  cont fentanyl patch +oxycodone.   Bilateral sacral wounds --status post debridement with surgery --cont wound care per order  End-stage renal disease on hemodialysis -- She is having a very hard time sitting up for dialysis given her morbid obesity significant pain from her skin lesions --iHD per nephro --need to set up outpatient dialysis before discharge  generalized weakness, deconditioning --pt decided to consider SNF rehab on 7/5  anemia of chronic disease in the setting of ESRD -- status post two unit blood transfusion -- no evidence of any active bleeding. -- No history of vaginally bleeding. -- CT scan of the abdomen showed abnormal endometrial thickening for postmenopausal woman ---outpatient f/u with Gyn --Epo with dialysis  hypertension  --cont meop  Type 2 Dm with end-stage renal disease, long-term use of insulin --reduce glargine to 15u daily --cont mealtime 3u TID --SSI   Pressure Injury 12/20/21 Buttocks Left Stage 4 - Full thickness tissue loss with exposed bone, tendon or muscle. open area right below coccyx (Active)  12/20/21 0800  Location: Buttocks  Location Orientation: Left  Staging: Stage 4 - Full thickness tissue loss with exposed bone, tendon or muscle.  Wound Description (Comments): open area right below coccyx  Present on Admission: Yes (First day with patient. Unknown if present at admission.)     Pressure Injury 01/01/22 Buttocks Left Stage 3 -  Full thickness tissue loss. Subcutaneous fat may be visible but bone, tendon or muscle are NOT exposed. (Active)  01/01/22 0222  Location: Buttocks  Location Orientation: Left  Staging: Stage 3 -  Full thickness tissue loss. Subcutaneous fat may be visible but bone, tendon or muscle are NOT exposed.  Wound Description (Comments):   Present on Admission: Yes     Pressure Injury 01/01/22 Buttocks Right Stage 4 - Full thickness tissue loss with exposed bone, tendon or muscle. (Active)  01/01/22 0223   Location: Buttocks  Location Orientation: Right  Staging: Stage 4 - Full thickness tissue loss with exposed bone, tendon or muscle.  Wound Description (Comments):   Present on Admission: Yes     Pressure Injury 01/01/22 Hip Left Stage 4 - Full thickness tissue loss with exposed bone, tendon or muscle. (Active)  01/01/22 0225  Location: Hip  Location Orientation: Left  Staging: Stage 4 - Full thickness tissue loss with exposed bone, tendon or muscle.  Wound Description (Comments):   Present on Admission: Yes     Pressure Injury 01/01/22 Hip Right Stage 3 -  Full thickness tissue loss. Subcutaneous fat may be visible but bone, tendon or muscle are NOT exposed. (Active)  01/01/22 0225  Location: Hip  Location Orientation: Right  Staging: Stage 3 -  Full thickness tissue loss. Subcutaneous fat may be visible but bone, tendon or muscle are NOT exposed.  Wound Description (Comments):   Present on Admission: Yes     Pressure Injury 01/12/22 Thigh Left;Upper Unstageable - Full thickness tissue loss in which the base of the injury is covered by slough (yellow, tan, gray, green or brown) and/or eschar (tan, brown or black) in the wound bed. previously noted "rugburns"  (Active)  01/12/22   Location: Thigh  Location Orientation: Left;Upper  Staging: Unstageable - Full thickness tissue loss in which the base of the injury is covered by slough (yellow, tan, gray, green or brown) and/or eschar (tan, brown or black) in the wound bed.  Wound Description (Comments): previously noted "rugburns" have evolved  Present on Admission: Yes     Pressure Injury 01/12/22 Thigh Left;Posterior Deep Tissue Pressure Injury - Purple or maroon localized area of discolored intact skin or blood-filled blister due to damage of underlying soft tissue from pressure and/or shear. previously noted " rugburns" (Active)  01/12/22   Location: Thigh  Location Orientation: Left;Posterior  Staging: Deep Tissue Pressure Injury  - Purple or maroon localized area of discolored intact skin or blood-filled blister due to damage of underlying soft tissue from pressure and/or shear.  Wound Description (Comments): previously noted " rugburns" have evolved  Present on Admission: Yes     Pressure Injury 02/05/22 Thigh Right;Lateral;Upper Unstageable - Full thickness tissue loss in which the base of the injury is covered by slough (yellow, tan, gray, green or brown) and/or eschar (tan, brown or black) in the wound bed. (Active)  02/05/22 0500  Location: Thigh  Location Orientation: Right;Lateral;Upper  Staging: Unstageable - Full thickness tissue loss in which the base of the injury is covered by slough (yellow, tan, gray, green or brown) and/or eschar (tan, brown or black) in the wound bed.  Wound Description (Comments):   Present on Admission: Yes    DVT prophylaxis: SCD/Compression stockings Code Status: DNR  Family Communication:  Status is: inpatient Dispo:   The patient is from: home Anticipated d/c is to: SNF Anticipated d/c date is: undetermined Patient currently is not medically stable to d/c due to: outpatient dialysis not set up yet   Enzo Bi M.D    [facility]

## 2022-02-06 NOTE — Progress Notes (Addendum)
[facility]  PROGRESS NOTE   Subjective:   Patient seen currently resting in bed, ill-appearing Alert and able to answer simple questions Patient currently recovering from hypoglycemic event Able to eat a bowl of cereal for breakfast, denies nausea Complains of bilateral lower extremity soreness.  States she may not be able to sit in a chair for dialysis treatment today   Objective:  Vital signs: Blood pressure (!) 132/53, pulse 76, temperature 97.6 F (36.4 C), resp. rate 18, height 5\' 3"  (1.6 m), weight 104 kg, SpO2 95 %. No intake or output data in the 24 hours ending 02/06/22 1414  Filed Weights   02/04/22 0500 02/05/22 0421  Weight: 102 kg 104 kg     Physical Exam: General:  No acute distress  Head:  Normocephalic, atraumatic. Moist oral mucosal membranes  Eyes:  Anicteric  Lungs:   Clear to auscultation, normal effort  Heart:  S1S2 no rubs  Abdomen:   Soft, nontender, bowel sounds present  Extremities:  Trace peripheral edema.  Neurologic:  Awake, alert, following commands  Skin:  Sacral wounds  Access:  Right IJ PermCath    Basic Metabolic Panel: Recent Labs  Lab 01/31/22 0645 02/02/22 1045 02/04/22 1742  NA 134* 132* 138  K 3.3* 4.0 3.9  CL 94* 94* 96*  CO2 23 22 26   GLUCOSE 130* 167* 230*  BUN 41* 67* 25*  CREATININE 3.03* 4.74* 2.48*  CALCIUM 9.6 9.3 9.1  PHOS 2.0* 3.8 1.7*     CBC: Recent Labs  Lab 01/31/22 0645 02/02/22 1045 02/04/22 1742  WBC 11.2* 10.6* 11.5*  HGB 9.2* 8.2* 9.2*  HCT 28.5* 26.1* 29.5*  MCV 85.8 87.3 86.5  PLT 252 269 262      Urinalysis: No results for input(s): "COLORURINE", "LABSPEC", "PHURINE", "GLUCOSEU", "HGBUR", "BILIRUBINUR", "KETONESUR", "PROTEINUR", "UROBILINOGEN", "NITRITE", "LEUKOCYTESUR" in the last 72 hours.  Invalid input(s): "APPERANCEUR"    Imaging: No results found.   Medications:    sodium chloride Stopped (01/19/22 1845)   sodium thiosulfate 25 g in sodium chloride 0.9 %  200 mL Infusion for Calciphylaxis 25 g (02/04/22 1146)    vitamin C  500 mg Oral BID   bisacodyl  10 mg Rectal Once   Chlorhexidine Gluconate Cloth  6 each Topical Q0600   epoetin (EPOGEN/PROCRIT) injection  10,000 Units Intravenous Q M,W,F-HD   fentaNYL  1 patch Transdermal Q72H   fluticasone  2 spray Each Nare Daily   gabapentin  200 mg Oral TID   insulin aspart  0-5 Units Subcutaneous QHS   insulin aspart  0-9 Units Subcutaneous TID WC   insulin aspart  3 Units Subcutaneous TID WC   insulin glargine-yfgn  15 Units Subcutaneous QHS   leptospermum manuka honey  1 Application Topical Daily   loratadine  10 mg Oral Daily   metoprolol succinate  25 mg Oral BID   multivitamin  1 tablet Oral QHS   nutrition supplement (JUVEN)  1 packet Oral BID BM   polyethylene glycol  17 g Oral Daily   Ensure Max Protein  11 oz Oral BID   senna-docusate  1 tablet Oral BID    Assessment/ Plan:     Principal Problem:   Severe sepsis (HCC) Active Problems:   OSA (obstructive sleep apnea)   Chronic kidney disease, stage 5 ([place])   Essential hypertension   DNR (do not resuscitate)/DNI(Do Not Intubate)   Type 2 diabetes mellitus with chronic kidney disease, with long-term current use of insulin ([place])  Hypomagnesemia   Pressure injury of skin   Symptomatic anemia   Elevated LFTs   Sacral wound, initial encounter   Swelling of left lower extremity   Abdominal pain   AKI (acute kidney injury) ([place])   Hyponatremia   Premature atrial complexes   Anasarca associated with disorder of kidney   Calciphylaxis   ESRD (end stage renal disease) ([place])  66 year old female with history of hypertension, diabetes, peripheral vascular disease, hyperlipidemia, now with sepsis and acute kidney injury on the top of chronic kidney disease.  Patient is reached end-stage renal disease and was started on renal replacement therapy.   2D echo from January 03, 2022-:LVEF 60 to 65%, no regional wall motion abnormalities,  asymmetric left ventricular hypertrophy of the basal septal segment.  Diastolic parameters normal.   #: End-stage renal disease on dialysis:  Dialysis scheduled for later today.  Attempted to dialyze patient on first shift however patient suffered hypoglycemic event.  Patient's treatment will be completed later, UF goal 0.5 to 1 L as tolerated. Renal navigator continues to seek outpatient dialysis clinic.  Updated hepatitis B labs and chest x-ray ordered for outpatient placement.   #: Sepsis/fever likely from sacral wound. Biopsy negative for calciphylaxis. Prescribed Meropenem, with plans to hold. Negative pressure wound VAC remains in place and managed by surgery team.  Sodium thiosulfate ordered with dialysis treatments.   #: Anemia secondary to chronic kidney disease complicated by sepsis. Has received blood transfusions during this admission.  Lab Results  Component Value Date   HGB 9.2 (L) 02/04/2022  Hemoglobin within acceptable target.  We will continue Epogen with dialysis treatments.   #: Secondary hyperparathyroidism:  We will obtain updated labs with dialysis today.  Continue nutritional supplements.  # Diabetes with CKD Lab Results  Component Value Date   HGBA1C 10.0 (H) 12/18/2021  Patient experienced hypoglycemic event this a.m., 35 with correction to 74.  Primary team to manage SSI    LOS: [facility] 7/7/20232:14 PM

## 2022-02-06 NOTE — Progress Notes (Signed)
Physical Therapy Treatment Patient Details Name: Theresa Warren MRN: [id number] DOB: 11-27-1955 Today's Date: 02/06/2022   History of Present Illness [name] is a 66 year old female admitted for evaluation of low sodium noted on labs at the facility.  Pt was discharged  to rehab on 5/23 after being admitted following a fall. PMH significant for acute on chronic anemia. hyperlipidemia, hypertension, CKD stage V, insulin-dependent diabetes mellitus.   PT Comments    Pt leaning forward sitting in bed upon arrival. Pt agreeable to therapy with encouragement as pt had not been to dialysis yet this date, PT offered to assist with transferring pt into HD chair (individual transporting pt to dialysis arrived mid-session). Pt appeared anxious during session, mumbling throughout with concerns of not being able to transfer or walk because her legs felt weak and verbalized fear of falling if getting up. Pt required Mod A to transfer from supine to sitting EOB with assist for LE mgmt and truncal support as pt stated her L LE was not working well. Pt Max Ax2 to step pivot to HD chair with pt able to take a few small steps. Pt took 3 standing trials with lateral steps to get hips aligned properly in chair, followed by Max Ax2 to scoot backward in chair. Continue to recommend STR at discharge.    Recommendations for follow up therapy are one component of a multi-disciplinary discharge planning process, led by the attending physician.  Recommendations may be updated based on patient status, additional functional criteria and insurance authorization.  Follow Up Recommendations  Skilled nursing-short term rehab (<3 hours/day) Can patient physically be transported by private vehicle: No   Assistance Recommended at Discharge Frequent or constant Supervision/Assistance  Patient can return home with the following Two people to help with walking and/or transfers;Two people to help with  bathing/dressing/bathroom;Assistance with cooking/housework;Assist for transportation;Help with stairs or ramp for entrance   Equipment Recommendations  Rolling walker (2 wheels);BSC/3in1;Wheelchair cushion (measurements PT);Wheelchair (measurements PT);Hospital bed    Recommendations for Other Services       Precautions / Restrictions Precautions Precautions: Fall Precaution Comments: sacral & leg wounds Restrictions Weight Bearing Restrictions: No     Mobility  Bed Mobility Overal bed mobility: Needs Assistance Bed Mobility: Supine to Sit     Supine to sit: Mod assist     General bed mobility comments: pt required Mod A to sit EOB this date with increased time and effort, use of bed rail, and assist with LE mgmt, truncal support as pt stated her L LE is not working    Transfers Overall transfer level: Needs assistance Equipment used: None Transfers: Sit to/from Stand, Bed to chair/wheelchair/BSC Sit to Stand: +2 physical assistance, Max assist   Step pivot transfers: Max assist, +2 physical assistance       General transfer comment: pt required Max Ax2 to stand and step pivot transfer from bed to HD chair. Pt unable to stand fully during transfer, but was able to take a few steps with weight shift. 3 attempts to get hips aligned in chair properly.    Ambulation/Gait               General Gait Details: NT this date; pt transferred to HD chair for transport to dialysis   Stairs             Wheelchair Mobility    Modified Rankin (Stroke Patients Only)       Balance Overall balance assessment: Needs assistance Sitting-balance support: Feet  supported, Bilateral upper extremity supported Sitting balance-Leahy Scale: Fair Sitting balance - Comments: Pt able to static sit EOB and lift UE for placement of gait belt without LOB   Standing balance support: Bilateral upper extremity supported, During functional activity Standing balance-Leahy Scale:  Poor Standing balance comment: Pt required Max Ax2 for standing balance with inability to stand fully (pt hips/knees flexed) as pt stated "she cannot do it because she cannot see the floor" as we were standing in front of her                            Cognition Arousal/Alertness: Awake/alert Behavior During Therapy: WFL for tasks assessed/performed Overall Cognitive Status: No family/caregiver present to determine baseline cognitive functioning Area of Impairment: Attention, Following commands, Awareness, Safety/judgement, Problem solving                   Current Attention Level: Selective   Following Commands: Follows one step commands with increased time, Follows one step commands inconsistently Safety/Judgement: Decreased awareness of safety, Decreased awareness of deficits   Problem Solving: Slow processing, Decreased initiation, Difficulty sequencing, Requires verbal cues, Requires tactile cues General Comments: Pt appeared anxious this date stating her legs are not going to be able to work properly and she was fearful of falling with transfer to chair. Pt mumbling duration of session. Pt A&Ox4.        Exercises      General Comments        Pertinent Vitals/Pain Pain Assessment Pain Assessment: Faces Faces Pain Scale: No hurt    Home Living                          Prior Function            PT Goals (current goals can now be found in the care plan section) Acute Rehab PT Goals Patient Stated Goal: to improve strength and mobility PT Goal Formulation: With patient Time For Goal Achievement: 02/19/22 Potential to Achieve Goals: Fair Progress towards PT goals: Progressing toward goals    Frequency    Min 2X/week      PT Plan Current plan remains appropriate    Co-evaluation              AM-PAC PT "6 Clicks" Mobility   Outcome Measure  Help needed turning from your back to your side while in a flat bed without using  bedrails?: A Little Help needed moving from lying on your back to sitting on the side of a flat bed without using bedrails?: A Lot Help needed moving to and from a bed to a chair (including a wheelchair)?: A Lot Help needed standing up from a chair using your arms (e.g., wheelchair or bedside chair)?: A Lot Help needed to walk in hospital room?: A Lot Help needed climbing 3-5 steps with a railing? : Total 6 Click Score: 12    End of Session Equipment Utilized During Treatment: Gait belt Activity Tolerance: Patient tolerated treatment well Patient left: in chair;Other (comment) (handover to individual at end of session to transport to HD) Nurse Communication: Mobility status PT Visit Diagnosis: Unsteadiness on feet (R26.81);Muscle weakness (generalized) (M62.81);Difficulty in walking, not elsewhere classified (R26.2)     Time: 9528-4132 PT Time Calculation (min) (ACUTE ONLY): 26 min  Charges:  $Therapeutic Activity: 23-37 mins  Newell Wafer, SPT  Nitisha Civello 02/06/2022, 4:47 PM

## 2022-02-06 NOTE — Progress Notes (Signed)
Hypoglycemic Event  CBG: 35  Treatment: 8 oz juice/soda  Symptoms: None  Follow-up CBG: Time: 08:38 am  CBG Result: 43  Treatment: 8 oz juice/soda  Symptoms: None  Follow-up CBG: Time: 08:52 am CBG Result: 70  Comments/MD notified: Dr. Billie Ruddy notified by Diabetes Coordinator @08 :54am Semglee decreased to 15units QHS.  Thanks, Braylen Denunzio Lars Masson, BSN, RN

## 2022-02-06 NOTE — Progress Notes (Signed)
   02/04/22 1245 02/04/22 1249  Vitals  BP (!) 141/79  --   MAP (mmHg) 96  --   Pulse Rate (!) 110  --   ECG Heart Rate (!) 110  --   Resp 20  --   During Treatment Monitoring  Blood Flow Rate (mL/min) 400 mL/min  --   Arterial Pressure (mmHg) -180 mmHg  --   Venous Pressure (mmHg) 170 mmHg  --   Transmembrane Pressure (mmHg) 50 mmHg  --   Ultrafiltration Rate (mL/min) 500 mL/min  --   Dialysate Flow Rate (mL/min) 600 ml/min  --   Conductivity 13.7  --   HD Safety Checks Performed Yes  --   Intra-Hemodialysis Comments Progressing as prescribed Tx completed

## 2022-02-06 NOTE — Progress Notes (Signed)
Hemodialysis Post Treatment Note:  Tx date:02/06/2022 Tx time: 3 hours Access: right CVC UF Removed: 1 L  Note:  HD treatment completed and tolerated well. NO HD complications. Post HD BP 131/72

## 2022-02-06 NOTE — Progress Notes (Signed)
   02/04/22 1115 02/04/22 1130 02/04/22 1145  Vitals  BP (!) 124/94 130/81 (!) 151/69  MAP (mmHg) 104 96 88  Pulse Rate (!) 104 (!) 109 (!) 104  ECG Heart Rate (!) 107 (!) 109 (!) 106  Resp 19 17 (!) 23  During Treatment Monitoring  Blood Flow Rate (mL/min) 400 mL/min 400 mL/min 400 mL/min  Arterial Pressure (mmHg) -170 mmHg -170 mmHg -180 mmHg  Venous Pressure (mmHg) 150 mmHg 150 mmHg 170 mmHg  Transmembrane Pressure (mmHg) 50 mmHg 50 mmHg 50 mmHg  Ultrafiltration Rate (mL/min) 500 mL/min 500 mL/min 500 mL/min  Dialysate Flow Rate (mL/min) 600 ml/min 600 ml/min 600 ml/min  Conductivity 13.7 13.7 13.7  HD Safety Checks Performed Yes Yes Yes  Intra-Hemodialysis Comments Progressing as prescribed Progressing as prescribed Progressing as prescribed    02/04/22 1200 02/04/22 1215 02/04/22 1230  Vitals  BP (!) 145/74 136/66 (!) 152/75  MAP (mmHg) 94 86 98  Pulse Rate (!) 103 (!) 105 (!) 111  ECG Heart Rate (!) 106 (!) 105 (!) 111  Resp (!) 31 (!) 29 20  During Treatment Monitoring  Blood Flow Rate (mL/min) 400 mL/min 400 mL/min 400 mL/min  Arterial Pressure (mmHg) -180 mmHg -180 mmHg -180 mmHg  Venous Pressure (mmHg) 170 mmHg 170 mmHg 170 mmHg  Transmembrane Pressure (mmHg) 50 mmHg 50 mmHg 50 mmHg  Ultrafiltration Rate (mL/min) 500 mL/min 500 mL/min 500 mL/min  Dialysate Flow Rate (mL/min) 600 ml/min 600 ml/min 600 ml/min  Conductivity 13.7 13.7 13.7  HD Safety Checks Performed Yes Yes Yes  Intra-Hemodialysis Comments Progressing as prescribed Progressing as prescribed Progressing as prescribed

## 2022-02-06 NOTE — Progress Notes (Signed)
   02/04/22 0946 02/04/22 1000 02/04/22 1015  Vitals  BP  --  (!) 173/87 (!) 162/75  MAP (mmHg)  --   --   --   Pulse Rate  --  (!) 111 (!) 113  ECG Heart Rate  --  (!) 112 (!) 113  Resp  --  (!) 28 (!) 21  During Treatment Monitoring  Blood Flow Rate (mL/min) 400 mL/min 400 mL/min 400 mL/min  Arterial Pressure (mmHg) -170 mmHg -190 mmHg -190 mmHg  Venous Pressure (mmHg) 140 mmHg 140 mmHg 140 mmHg  Transmembrane Pressure (mmHg) 50 mmHg 50 mmHg 50 mmHg  Ultrafiltration Rate (mL/min) 500 mL/min 500 mL/min 500 mL/min  Dialysate Flow Rate (mL/min) 600 ml/min 600 ml/min 600 ml/min  Conductivity 13.7 13.7 13.7  HD Safety Checks Performed Yes Yes Yes  Dialysis Fluid Bolus Normal Saline  --   --   Bolus Amount (mL) 250 mL  --   --   Intra-Hemodialysis Comments Tx initiated Progressing as prescribed Progressing as prescribed    02/04/22 1030 02/04/22 1045 02/04/22 1100  Vitals  BP (!) 134/114 126/83 (!) 143/65  MAP (mmHg) 122 92 88  Pulse Rate (!) 115 (!) 105 (!) 101  ECG Heart Rate (!) 115 (!) 106 (!) 102  Resp 19 (!) 24 19  During Treatment Monitoring  Blood Flow Rate (mL/min) 400 mL/min 400 mL/min 400 mL/min  Arterial Pressure (mmHg) -190 mmHg -190 mmHg -180 mmHg  Venous Pressure (mmHg) 140 mmHg 140 mmHg 150 mmHg  Transmembrane Pressure (mmHg) 50 mmHg 50 mmHg 50 mmHg  Ultrafiltration Rate (mL/min) 500 mL/min 500 mL/min 500 mL/min  Dialysate Flow Rate (mL/min) 600 ml/min 600 ml/min 600 ml/min  Conductivity 13.7 13.7 13.7  HD Safety Checks Performed Yes Yes Yes  Dialysis Fluid Bolus  --   --   --   Bolus Amount (mL)  --   --   --   Intra-Hemodialysis Comments Progressing as prescribed Progressing as prescribed Progressing as prescribed

## 2022-02-06 NOTE — Progress Notes (Signed)
02/02/22 1028 02/02/22 1045 02/02/22 1100  Vitals  BP 140/76 115/60 137/69  MAP (mmHg) 94 77 90  Pulse Rate 95 86 90  ECG Heart Rate 94 85 89  Resp 15 15 17   During Treatment Monitoring  Blood Flow Rate (mL/min) 400 mL/min 400 mL/min 400 mL/min  Arterial Pressure (mmHg) -150 mmHg -150 mmHg -160 mmHg  Venous Pressure (mmHg) 120 mmHg 120 mmHg 120 mmHg  Transmembrane Pressure (mmHg) 40 mmHg 40 mmHg 40 mmHg  Ultrafiltration Rate (mL/min) 500 mL/min 500 mL/min 500 mL/min  Dialysate Flow Rate (mL/min) 600 ml/min 600 ml/min 600 ml/min  Conductivity 13.9 13.9 14  HD Safety Checks Performed Yes Yes Yes  Intra-Hemodialysis Comments Tx initiated Progressing as prescribed;Tolerated well Progressing as prescribed;Tolerated well    02/02/22 1115 02/02/22 1130 02/02/22 1145  Vitals  BP 122/78 (!) 118/57 128/61  MAP (mmHg) 91 75 80  Pulse Rate 82 81 77  ECG Heart Rate 82 82 79  Resp 11 17 13   During Treatment Monitoring  Blood Flow Rate (mL/min) 400 mL/min 400 mL/min 400 mL/min  Arterial Pressure (mmHg) -160 mmHg -160 mmHg -160 mmHg  Venous Pressure (mmHg) 120 mmHg 120 mmHg 120 mmHg  Transmembrane Pressure (mmHg) 40 mmHg 40 mmHg 40 mmHg  Ultrafiltration Rate (mL/min) 500 mL/min 500 mL/min 500 mL/min  Dialysate Flow Rate (mL/min) 600 ml/min 600 ml/min 600 ml/min  Conductivity 13.9 13.9 13.9  HD Safety Checks Performed Yes Yes Yes  Intra-Hemodialysis Comments Progressing as prescribed;Tolerated well Progressing as prescribed;Tolerated well Progressing as prescribed;Tolerated well    02/02/22 1200 02/02/22 1215 02/02/22 1230  Vitals  BP 115/63 110/78 126/63  MAP (mmHg) 77 88 81  Pulse Rate 77 74 80  ECG Heart Rate 78 75 79  Resp 13 12 13   During Treatment Monitoring  Blood Flow Rate (mL/min) 400 mL/min 400 mL/min 400 mL/min  Arterial Pressure (mmHg) -160 mmHg -160 mmHg -160 mmHg  Venous Pressure (mmHg) 120 mmHg 110 mmHg 110 mmHg  Transmembrane Pressure (mmHg) 40 mmHg 40 mmHg 40 mmHg   Ultrafiltration Rate (mL/min) 500 mL/min 500 mL/min 500 mL/min  Dialysate Flow Rate (mL/min) 600 ml/min 600 ml/min 600 ml/min  Conductivity 13.9 13.9 13.9  HD Safety Checks Performed Yes Yes Yes  Intra-Hemodialysis Comments Progressing as prescribed;Tolerated well Progressing as prescribed;Tolerated well Progressing as prescribed;Tolerated well    02/02/22 1245 02/02/22 1300 02/02/22 1315  Vitals  BP (!) 113/55 121/69 (!) 112/57  MAP (mmHg) 71 85 73  Pulse Rate 75 73 73  ECG Heart Rate 76 73 73  Resp 17 18 14   During Treatment Monitoring  Blood Flow Rate (mL/min) 400 mL/min 400 mL/min 400 mL/min  Arterial Pressure (mmHg) -160 mmHg -160 mmHg -160 mmHg  Venous Pressure (mmHg) 110 mmHg 120 mmHg 130 mmHg  Transmembrane Pressure (mmHg) 40 mmHg 40 mmHg 40 mmHg  Ultrafiltration Rate (mL/min) 710 mL/min 710 mL/min 710 mL/min  Dialysate Flow Rate (mL/min) 600 ml/min 600 ml/min 600 ml/min  Conductivity 13.9 13.9 13.9  HD Safety Checks Performed Yes Yes Yes  Intra-Hemodialysis Comments Progressing as prescribed;Tolerated well Progressing as prescribed;Tolerated well Progressing as prescribed;Tolerated well    02/02/22 1330 02/02/22 1333  Vitals  BP (!) 114/57  --   MAP (mmHg) 70  --   Pulse Rate 72 72  ECG Heart Rate 72 73  Resp 12 14  During Treatment Monitoring  Blood Flow Rate (mL/min) 400 mL/min  --   Arterial Pressure (mmHg) -160 mmHg  --   Venous Pressure (  mmHg) 140 mmHg  --   Transmembrane Pressure (mmHg) 40 mmHg  --   Ultrafiltration Rate (mL/min) 710 mL/min  --   Dialysate Flow Rate (mL/min) 600 ml/min  --   Conductivity 13.9  --   HD Safety Checks Performed Yes  --   Intra-Hemodialysis Comments Progressing as prescribed;Tolerated well Tx completed

## 2022-02-07 ENCOUNTER — Inpatient Hospital Stay: Payer: Medicare Other

## 2022-02-07 DIAGNOSIS — R6520 Severe sepsis without septic shock: Secondary | ICD-10-CM | POA: Diagnosis not present

## 2022-02-07 DIAGNOSIS — A419 Sepsis, unspecified organism: Secondary | ICD-10-CM | POA: Diagnosis not present

## 2022-02-07 LAB — GLUCOSE, CAPILLARY
Glucose-Capillary: 82 mg/dL (ref 70–99)
Glucose-Capillary: 120 mg/dL — ABNORMAL HIGH (ref 70–99)
Glucose-Capillary: 118 mg/dL — ABNORMAL HIGH (ref 70–99)
Glucose-Capillary: 145 mg/dL — ABNORMAL HIGH (ref 70–99)

## 2022-02-07 NOTE — Progress Notes (Signed)
Occupational Therapy Treatment Patient Details Name: Theresa Warren MRN: [id number] DOB: 1955-12-06 Today's Date: 02/07/2022   History of present illness Pt. is a 66 year old female admitted for evaluation of low sodium noted on labs at the facility.  Pt was discharged  to rehab on 5/23 after being admitted following a fall. PMH significant for acute on chronic anemia. hyperlipidemia, hypertension, CKD stage V, insulin-dependent diabetes mellitus.   OT comments  Pt. was sleeping soundly upon arrival. Pt. was easily awakened, and  agreeable to UE there. Ex. Pt. Performed left shoulder flexion,  horizontal abduction, and bilateral elbow flexion, and extension for 1 set 10 reps each with yellow theraband. Rest breaks were required. Pt. Education was provided about form, and technique. BUE ther. Ex was performed to increase strength needed for transfers, and ADLs. Pt. continues to benefit from OT services for ADL training, A/E training, and pt. education about home modification, and DME.    Recommendations for follow up therapy are one component of a multi-disciplinary discharge planning process, led by the attending physician.  Recommendations may be updated based on patient status, additional functional criteria and insurance authorization.    Follow Up Recommendations  Skilled nursing-short term rehab (<3 hours/day)    Assistance Recommended at Discharge Frequent or constant Supervision/Assistance  Patient can return home with the following  A lot of help with bathing/dressing/bathroom;Assist for transportation;Help with stairs or ramp for entrance;Two people to help with walking and/or transfers   Equipment Recommendations  Wheelchair cushion (measurements OT);Wheelchair (measurements OT);BSC/3in1;Hospital bed    Recommendations for Other Services      Precautions / Restrictions Precautions Precautions: Fall Restrictions Weight Bearing Restrictions: No       Mobility Bed  Mobility    MinA for repositioning                Transfers      Deferred                   Balance                                           ADL either performed or assessed with clinical judgement   ADL    Extremity/Trunk Assessment Upper Extremity Assessment Upper Extremity Assessment: Overall WFL for tasks assessed            Vision Patient Visual Report: No change from baseline     Perception     Praxis      Cognition Arousal/Alertness: Awake/alert Behavior During Therapy: WFL for tasks assessed/performed                                            Exercises Other Exercises Other Exercises: Pt. performed UE ther. ex with yellow theraband for left shoulder flexion, horizontal abduction, bilateral elbow flexion, and extension for 1 set 10 reps each    Shoulder Instructions       General Comments      Pertinent Vitals/ Pain       Pain Assessment Pain Assessment: No/denies pain  Home Living  Prior Functioning/Environment              Frequency  Min 2X/week        Progress Toward Goals  OT Goals(current goals can now be found in the care plan section)  Progress towards OT goals: Progressing toward goals  Acute Rehab OT Goals Patient Stated Goal: To go home OT Goal Formulation: With patient Time For Goal Achievement: 02/12/22 Potential to Achieve Goals: [place] Discharge plan remains appropriate;Frequency remains appropriate    Co-evaluation                 AM-PAC OT "6 Clicks" Daily Activity     Outcome Measure   Help from another person eating meals?: None Help from another person taking care of personal grooming?: A Little Help from another person toileting, which includes using toliet, bedpan, or urinal?: A Lot Help from another person bathing (including washing, rinsing, drying)?: A Lot Help from another  person to put on and taking off regular upper body clothing?: A Little Help from another person to put on and taking off regular lower body clothing?: A Lot 6 Click Score: 16    End of Session Equipment Utilized During Treatment: Gait belt;Rolling walker (2 wheels)  OT Visit Diagnosis: Other abnormalities of gait and mobility (R26.89);Muscle weakness (generalized) (M62.81)   Activity Tolerance Patient tolerated treatment well   Patient Left     Nurse Communication          Time: 7262-0355 OT Time Calculation (min): 25 min  Charges: OT General Charges $OT Visit: 1 Visit OT Treatments $Self Care/Home Management : 23-37 mins  Harrel Carina, MS, OTR/L  Harrel Carina 02/07/2022, 4:25 PM

## 2022-02-07 NOTE — Progress Notes (Addendum)
[facility]  PROGRESS NOTE   Subjective:   Patient seen currently resting in bed, ill-appearing Patient resting comfortably in the bed   Objective:  Vital signs: Blood pressure (!) 145/58, pulse (!) 109, temperature 98.7 F (37.1 C), resp. rate 18, height 5\' 3"  (1.6 m), weight 104 kg, SpO2 99 %.  Intake/Output Summary (Last 24 hours) at 02/07/2022 0541 Last data filed at 02/06/2022 1848 Gross per 24 hour  Intake 640 ml  Output 1300 ml  Net -660 ml   Filed Weights   02/04/22 0500 02/05/22 0421  Weight: 102 kg 104 kg     Physical Exam: General:  No acute distress  Head:  Normocephalic, atraumatic. Moist oral mucosal membranes  Eyes:  Anicteric  Lungs:   Clear to auscultation, normal effort  Heart:  S1S2 no rubs  Abdomen:   Soft, nontender, bowel sounds present  Extremities:  Trace peripheral edema.  Neurologic:  Awake, alert, following commands  Skin:  Sacral wounds  Access:  Right IJ PermCath    Basic Metabolic Panel: Recent Labs  Lab 01/31/22 0645 02/02/22 1045 02/04/22 1742 02/06/22 1332  NA 134* 132* 138 138  K 3.3* 4.0 3.9 4.5  CL 94* 94* 96* 97*  CO2 23 22 26 23   GLUCOSE 130* 167* 230* 159*  BUN 41* 67* 25* 45*  CREATININE 3.03* 4.74* 2.48* 4.23*  CALCIUM 9.6 9.3 9.1 10.3  PHOS 2.0* 3.8 1.7* 3.1    CBC: Recent Labs  Lab 01/31/22 0645 02/02/22 1045 02/04/22 1742 02/06/22 1332  WBC 11.2* 10.6* 11.5* 14.4*  HGB 9.2* 8.2* 9.2* 9.0*  HCT 28.5* 26.1* 29.5* 28.7*  MCV 85.8 87.3 86.5 87.5  PLT 252 269 262 350     Urinalysis: No results for input(s): "COLORURINE", "LABSPEC", "PHURINE", "GLUCOSEU", "HGBUR", "BILIRUBINUR", "KETONESUR", "PROTEINUR", "UROBILINOGEN", "NITRITE", "LEUKOCYTESUR" in the last 72 hours.  Invalid input(s): "APPERANCEUR"    Imaging: No results found.   Medications:    sodium chloride Stopped (01/19/22 1845)   sodium thiosulfate 25 g in sodium chloride 0.9 % 200 mL Infusion for Calciphylaxis 25 g (02/06/22  1741)    vitamin C  500 mg Oral BID   bisacodyl  10 mg Rectal Once   Chlorhexidine Gluconate Cloth  6 each Topical Q0600   epoetin alfa       epoetin (EPOGEN/PROCRIT) injection  10,000 Units Intravenous Q M,W,F-HD   fentaNYL  1 patch Transdermal Q72H   fluticasone  2 spray Each Nare Daily   gabapentin  200 mg Oral TID   insulin aspart  0-5 Units Subcutaneous QHS   insulin aspart  0-9 Units Subcutaneous TID WC   insulin aspart  3 Units Subcutaneous TID WC   insulin glargine-yfgn  15 Units Subcutaneous QHS   leptospermum manuka honey  1 Application Topical Daily   loratadine  10 mg Oral Daily   metoprolol succinate  25 mg Oral BID   multivitamin  1 tablet Oral QHS   nutrition supplement (JUVEN)  1 packet Oral BID BM   polyethylene glycol  17 g Oral Daily   Ensure Max Protein  11 oz Oral BID   senna-docusate  1 tablet Oral BID    Assessment/ Plan:     Principal Problem:   Severe sepsis (HCC) Active Problems:   OSA (obstructive sleep apnea)   Chronic kidney disease, stage 5 ([place])   Essential hypertension   DNR (do not resuscitate)/DNI(Do Not Intubate)   Type 2 diabetes mellitus with chronic kidney disease, with long-term  current use of insulin (HCC)   Hypomagnesemia   Pressure injury of skin   Symptomatic anemia   Elevated LFTs   Sacral wound, initial encounter   Swelling of left lower extremity   Abdominal pain   AKI (acute kidney injury) (HCC)   Hyponatremia   Premature atrial complexes   Anasarca associated with disorder of kidney   Calciphylaxis   ESRD (end stage renal disease) (HCC)  66 year old female with history of hypertension, diabetes, peripheral vascular disease, hyperlipidemia, now with sepsis and acute kidney injury on the top of chronic kidney disease.  Patient is reached end-stage renal disease and was started on renal replacement therapy.   2D echo from January 03, 2022-:LVEF 60 to 65%, no regional wall motion abnormalities, asymmetric left ventricular  hypertrophy of the basal septal segment.  Diastolic parameters normal.   #: End-stage renal disease on dialysis: Patient was last dialyzed on Friday No need for dialysis today Renal navigator continues to seek outpatient dialysis clinic.   Patient does have updated hepatitis B labs and chest x-ray ordered for outpatient placement.   #: Sepsis/fever likely from sacral wound. Biopsy negative for calciphylaxis. Prescribed Meropenem, with plans to hold. Negative pressure wound VAC remains in place and managed by surgery team.  Sodium thiosulfate ordered with dialysis treatments.   #: Anemia secondary to chronic kidney disease complicated by sepsis. Has received blood transfusions during this admission.  Lab Results  Component Value Date   HGB 9.0 (L) 02/06/2022  Hemoglobin within acceptable target.  We will continue Epogen with dialysis treatments.   #: Secondary hyperparathyroidism:  We will continue to follow.  Continue nutritional supplements.  # Diabetes with CKD Lab Results  Component Value Date   HGBA1C 10.0 (H) 12/18/2021    Primary team to manage SSI    LOS: 37 Haig Gerardo s [facility] 7/8/20235:41 AM

## 2022-02-07 NOTE — Progress Notes (Signed)
Pt refused daily dressing change to bilateral legs and hips. Educated the patient regarding  the importance of daily dressing changes as ordered. The Pt respondedstated "these bandages only need changing every other day".

## 2022-02-07 NOTE — Progress Notes (Signed)
[place] at [place] NAME: Theresa Warren    MR#:  [id number]  DATE OF BIRTH:  10-17-1955  SUBJECTIVE:  Nursing noted that pt refuses dressing change sometimes and only wanted them changed every other day.   VITALS:  Blood pressure 140/70, pulse 88, temperature 99.5 F (37.5 C), resp. rate 15, height 5\' 3"  (1.6 m), weight 104 kg, SpO2 98 %.  PHYSICAL EXAMINATION:   Constitutional: NAD CV: No cyanosis.   RESP: normal respiratory effort, on RA   LABORATORY PANEL:  CBC Recent Labs  Lab 02/06/22 1332  WBC 14.4*  HGB 9.0*  HCT 28.7*  PLT 350    Chemistries  Recent Labs  Lab 02/06/22 1332  NA 138  K 4.5  CL 97*  CO2 23  GLUCOSE 159*  BUN 45*  CREATININE 4.23*  CALCIUM 10.3    Assessment and Plan Theresa Warren is a 66 year old female hyperlipidemia, hypertension, CKD stage V, insulin-dependent diabetes mellitus, who presents emergency department from [place] for evaluation of low sodium noted on labs at the facility.  She was discharged to rehab on 5/23 after being admitted following a fall and also with acute on chronic anemia.  Patient has been here for last 27 days. Please review detailed progress note in [facility] if needed.  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 Steva Ready has messaged dermatology to discuss and give curbside consultation -- currently off antibiotic Plan: --continue aggressive wound care --discussed pain management--for now cont fentanyl patch +oxycodone.   Bilateral sacral wounds --status post debridement with surgery --cont wound care per order  End-stage renal disease on hemodialysis -- She is  having a very hard time sitting up for dialysis given her morbid obesity significant pain from her skin lesions --iHD per nephro --need to set up outpatient dialysis before discharge  generalized weakness, deconditioning --pt decided to consider SNF rehab on 7/5  anemia of chronic disease in the setting of ESRD -- status post two unit blood transfusion -- no evidence of any active bleeding. -- No history of vaginally bleeding. -- CT scan of the abdomen showed abnormal endometrial thickening for postmenopausal woman ---outpatient f/u with Gyn --Epo with dialysis  hypertension  --cont meop  Type 2 Dm with end-stage renal disease, long-term use of insulin --cont glargine to 15u daily --cont mealtime 3u TID --SSI   Pressure Injury 12/20/21 Buttocks Left Stage 4 - Full thickness tissue loss with exposed bone, tendon or muscle. open area right below coccyx (Active)  12/20/21 0800  Location: Buttocks  Location Orientation: Left  Staging: Stage 4 - Full thickness tissue loss with exposed bone, tendon or muscle.  Wound Description (Comments): open area right below coccyx  Present on Admission: Yes (First day with patient. Unknown if present at admission.)     Pressure Injury 01/01/22 Buttocks Left Stage 3 -  Full thickness tissue loss. Subcutaneous fat may be visible but bone, tendon or muscle are NOT exposed. (Active)  01/01/22 0222  Location: Buttocks  Location Orientation: Left  Staging: Stage 3 -  Full thickness tissue loss. Subcutaneous fat may be visible but bone, tendon or muscle are NOT exposed.  Wound Description (Comments):   Present on Admission: Yes  Pressure Injury 01/01/22 Buttocks Right Stage 4 - Full thickness tissue loss with exposed bone, tendon or muscle. (Active)  01/01/22 0223  Location: Buttocks  Location Orientation: Right  Staging: Stage 4 - Full thickness tissue loss with exposed bone, tendon or muscle.  Wound Description (Comments):   Present on  Admission: Yes     Pressure Injury 01/01/22 Hip Left Stage 4 - Full thickness tissue loss with exposed bone, tendon or muscle. (Active)  01/01/22 0225  Location: Hip  Location Orientation: Left  Staging: Stage 4 - Full thickness tissue loss with exposed bone, tendon or muscle.  Wound Description (Comments):   Present on Admission: Yes     Pressure Injury 01/01/22 Hip Right Stage 3 -  Full thickness tissue loss. Subcutaneous fat may be visible but bone, tendon or muscle are NOT exposed. (Active)  01/01/22 0225  Location: Hip  Location Orientation: Right  Staging: Stage 3 -  Full thickness tissue loss. Subcutaneous fat may be visible but bone, tendon or muscle are NOT exposed.  Wound Description (Comments):   Present on Admission: Yes     Pressure Injury 01/12/22 Thigh Left;Upper Unstageable - Full thickness tissue loss in which the base of the injury is covered by slough (yellow, tan, gray, green or brown) and/or eschar (tan, brown or black) in the wound bed. previously noted "rugburns"  (Active)  01/12/22   Location: Thigh  Location Orientation: Left;Upper  Staging: Unstageable - Full thickness tissue loss in which the base of the injury is covered by slough (yellow, tan, gray, green or brown) and/or eschar (tan, brown or black) in the wound bed.  Wound Description (Comments): previously noted "rugburns" have evolved  Present on Admission: Yes     Pressure Injury 01/12/22 Thigh Left;Posterior Deep Tissue Pressure Injury - Purple or maroon localized area of discolored intact skin or blood-filled blister due to damage of underlying soft tissue from pressure and/or shear. previously noted " rugburns" (Active)  01/12/22   Location: Thigh  Location Orientation: Left;Posterior  Staging: Deep Tissue Pressure Injury - Purple or maroon localized area of discolored intact skin or blood-filled blister due to damage of underlying soft tissue from pressure and/or shear.  Wound Description  (Comments): previously noted " rugburns" have evolved  Present on Admission: Yes     Pressure Injury 02/05/22 Thigh Right;Lateral;Upper Unstageable - Full thickness tissue loss in which the base of the injury is covered by slough (yellow, tan, gray, green or brown) and/or eschar (tan, brown or black) in the wound bed. (Active)  02/05/22 0500  Location: Thigh  Location Orientation: Right;Lateral;Upper  Staging: Unstageable - Full thickness tissue loss in which the base of the injury is covered by slough (yellow, tan, gray, green or brown) and/or eschar (tan, brown or black) in the wound bed.  Wound Description (Comments):   Present on Admission: Yes    DVT prophylaxis: SCD/Compression stockings Code Status: DNR  Family Communication:  Status is: inpatient Dispo:   The patient is from: home Anticipated d/c is to: SNF Anticipated d/c date is: undetermined Patient currently is not medically stable to d/c due to: outpatient dialysis not set up yet   Enzo Bi M.D    [facility]

## 2022-02-08 DIAGNOSIS — R6520 Severe sepsis without septic shock: Secondary | ICD-10-CM | POA: Diagnosis not present

## 2022-02-08 DIAGNOSIS — A419 Sepsis, unspecified organism: Secondary | ICD-10-CM | POA: Diagnosis not present

## 2022-02-08 LAB — GLUCOSE, CAPILLARY
Glucose-Capillary: 161 mg/dL — ABNORMAL HIGH (ref 70–99)
Glucose-Capillary: 168 mg/dL — ABNORMAL HIGH (ref 70–99)
Glucose-Capillary: 62 mg/dL — ABNORMAL LOW (ref 70–99)
Glucose-Capillary: 112 mg/dL — ABNORMAL HIGH (ref 70–99)
Glucose-Capillary: 139 mg/dL — ABNORMAL HIGH (ref 70–99)

## 2022-02-08 NOTE — Progress Notes (Signed)
[place] at [place] NAME: Theresa Warren    MR#:  [id number]  [place]:  08-04-1955  SUBJECTIVE:  RN reported pt wants regular mattress instead of air mattress.    Pt reported feeling better today.   VITALS:  Blood pressure (!) 157/73, pulse (!) 101, temperature 98.5 F (36.9 C), temperature source Oral, resp. rate 18, height 5\' 3"  (1.6 m), weight 104 kg, SpO2 100 %.  PHYSICAL EXAMINATION:   Constitutional: NAD, AAOx3 HEENT: conjunctivae and lids normal, EOMI CV: No cyanosis.   RESP: normal respiratory effort, on RA Neuro: II - XII grossly intact.   Psych: Normal mood and affect.     LABORATORY PANEL:  CBC Recent Labs  Lab 02/06/22 1332  WBC 14.4*  HGB 9.0*  HCT 28.7*  PLT 350    Chemistries  Recent Labs  Lab 02/06/22 1332  NA 138  K 4.5  CL 97*  CO2 23  GLUCOSE 159*  BUN 45*  CREATININE 4.23*  CALCIUM 10.3    Assessment and Plan Theresa Warren is a 66 year old female hyperlipidemia, hypertension, CKD stage V, insulin-dependent diabetes mellitus, who presents emergency department from [place] for evaluation of low sodium noted on labs at the facility.  She was discharged to rehab on 5/23 after being admitted following a fall and also with acute on chronic anemia.  Patient has been here for last 27 days. Please review detailed progress note in [facility] if needed.  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 Steva Ready has messaged dermatology to discuss and give curbside consultation -- currently off antibiotic Plan: --continue aggressive wound care --discussed pain management--for now cont fentanyl patch +oxycodone.    Bilateral sacral wounds --status post debridement with surgery --cont wound care per order  End-stage renal disease on hemodialysis -- She is having a very hard time sitting up for dialysis given her morbid obesity significant pain from her skin lesions --iHD per nephro --need to set up outpatient dialysis before discharge  generalized weakness, deconditioning --pt decided to consider SNF rehab on 7/5  anemia of chronic disease in the setting of ESRD -- status post two unit blood transfusion -- no evidence of any active bleeding. -- No history of vaginally bleeding. -- CT scan of the abdomen showed abnormal endometrial thickening for postmenopausal woman ---outpatient f/u with Gyn --Epo with dialysis  hypertension  --cont meop  Type 2 Dm with end-stage renal disease, long-term use of insulin --cont glargine to 15u daily --cont mealtime 3u TID --SSI   Pressure Injury 12/20/21 Buttocks Left Stage 4 - Full thickness tissue loss with exposed bone, tendon or muscle. open area right below coccyx (Active)  12/20/21 0800  Location: Buttocks  Location Orientation: Left  Staging: Stage 4 - Full thickness tissue loss with exposed bone, tendon or muscle.  Wound Description (Comments): open area right below coccyx  Present on Admission: Yes (First day with patient. Unknown if present at admission.)     Pressure Injury 01/01/22 Buttocks Left Stage 3 -  Full thickness tissue loss. Subcutaneous fat may be visible but bone, tendon or muscle are NOT exposed. (Active)  01/01/22 0222  Location: Buttocks  Location Orientation: Left  Staging: Stage 3 -  Full thickness tissue loss. Subcutaneous fat may be visible but bone, tendon or muscle are NOT exposed.  Wound Description (Comments):   Present on Admission: Yes     Pressure Injury 01/01/22 Buttocks Right Stage 4 - Full thickness tissue loss with exposed bone, tendon or muscle. (Active)  01/01/22 0223  Location: Buttocks  Location  Orientation: Right  Staging: Stage 4 - Full thickness tissue loss with exposed bone, tendon or muscle.  Wound Description (Comments):   Present on Admission: Yes     Pressure Injury 01/01/22 Hip Left Stage 4 - Full thickness tissue loss with exposed bone, tendon or muscle. (Active)  01/01/22 0225  Location: Hip  Location Orientation: Left  Staging: Stage 4 - Full thickness tissue loss with exposed bone, tendon or muscle.  Wound Description (Comments):   Present on Admission: Yes     Pressure Injury 01/01/22 Hip Right Stage 3 -  Full thickness tissue loss. Subcutaneous fat may be visible but bone, tendon or muscle are NOT exposed. (Active)  01/01/22 0225  Location: Hip  Location Orientation: Right  Staging: Stage 3 -  Full thickness tissue loss. Subcutaneous fat may be visible but bone, tendon or muscle are NOT exposed.  Wound Description (Comments):   Present on Admission: Yes     Pressure Injury 01/12/22 Thigh Left;Upper Unstageable - Full thickness tissue loss in which the base of the injury is covered by slough (yellow, tan, gray, green or brown) and/or eschar (tan, brown or black) in the wound bed. previously noted "rugburns"  (Active)  01/12/22   Location: Thigh  Location Orientation: Left;Upper  Staging: Unstageable - Full thickness tissue loss in which the base of the injury is covered by slough (yellow, tan, gray, green or brown) and/or eschar (tan, brown or black) in the wound bed.  Wound Description (Comments): previously noted "rugburns" have evolved  Present on Admission: Yes     Pressure Injury 01/12/22 Thigh Left;Posterior Deep Tissue Pressure Injury - Purple or maroon localized area of discolored intact skin or blood-filled blister due to damage of underlying soft tissue from pressure and/or shear. previously noted " rugburns" (Active)  01/12/22   Location: Thigh  Location Orientation: Left;Posterior  Staging: Deep Tissue Pressure Injury - Purple or maroon localized  area of discolored intact skin or blood-filled blister due to damage of underlying soft tissue from pressure and/or shear.  Wound Description (Comments): previously noted " rugburns" have evolved  Present on Admission: Yes     Pressure Injury 02/05/22 Thigh Right;Lateral;Upper Unstageable - Full thickness tissue loss in which the base of the injury is covered by slough (yellow, tan, gray, green or brown) and/or eschar (tan, brown or black) in the wound bed. (Active)  02/05/22 0500  Location: Thigh  Location Orientation: Right;Lateral;Upper  Staging: Unstageable - Full thickness tissue loss in which the base of the injury is covered by slough (yellow, tan, gray, green or brown) and/or eschar (tan, brown or black) in the wound bed.  Wound Description (Comments):   Present on Admission: Yes    DVT prophylaxis: SCD/Compression stockings Code Status: DNR  Family Communication:  Status is: inpatient Dispo:   The patient is from: home Anticipated d/c is to: SNF Anticipated d/c date is: undetermined Patient currently is not medically stable to d/c due to: outpatient dialysis not set up yet   Enzo Bi M.D    [facility]

## 2022-02-08 NOTE — Progress Notes (Signed)
[facility]  PROGRESS NOTE   Subjective:   Patient seen currently resting in bed, ill-appearing Patient resting comfortably in the bed   Objective:  Vital signs: Blood pressure (!) 144/68, pulse 84, temperature 98.7 F (37.1 C), resp. rate 18, height 5\' 3"  (1.6 m), weight 104 kg, SpO2 100 %. No intake or output data in the 24 hours ending 02/08/22 0603  Filed Weights   02/04/22 0500 02/05/22 0421  Weight: 102 kg 104 kg     Physical Exam: General:  No acute distress  Head:  Normocephalic, atraumatic. Moist oral mucosal membranes  Eyes:  Anicteric  Lungs:   Clear to auscultation, normal effort  Heart:  S1S2 no rubs  Abdomen:   Soft, nontender, bowel sounds present  Extremities:  Trace peripheral edema.  Neurologic:  Awake, alert, following commands  Skin:  Sacral wounds  Access:  Right IJ PermCath    Basic Metabolic Panel: Recent Labs  Lab 02/02/22 1045 02/04/22 1742 02/06/22 1332  NA 132* 138 138  K 4.0 3.9 4.5  CL 94* 96* 97*  CO2 22 26 23   GLUCOSE 167* 230* 159*  BUN 67* 25* 45*  CREATININE 4.74* 2.48* 4.23*  CALCIUM 9.3 9.1 10.3  PHOS 3.8 1.7* 3.1    CBC: Recent Labs  Lab 02/02/22 1045 02/04/22 1742 02/06/22 1332  WBC 10.6* 11.5* 14.4*  HGB 8.2* 9.2* 9.0*  HCT 26.1* 29.5* 28.7*  MCV 87.3 86.5 87.5  PLT 269 262 350     Urinalysis: No results for input(s): "COLORURINE", "LABSPEC", "PHURINE", "GLUCOSEU", "HGBUR", "BILIRUBINUR", "KETONESUR", "PROTEINUR", "UROBILINOGEN", "NITRITE", "LEUKOCYTESUR" in the last 72 hours.  Invalid input(s): "APPERANCEUR"    Imaging: DG Chest Port 1 View  Result Date: 02/07/2022 CLINICAL DATA:  Hyponatremia EXAM: PORTABLE CHEST 1 VIEW COMPARISON:  01/11/2021 FINDINGS: The heart size and mediastinal contours are within normal limits. Large-bore right neck vascular catheter. No acute airspace opacity. Unchanged elevation of the left hemidiaphragm with associated scarring or atelectasis. The visualized  skeletal structures are unremarkable. IMPRESSION: No acute abnormality of the lungs. Unchanged elevation of the left hemidiaphragm with associated scarring or atelectasis. Electronically Signed   By: Delanna Ahmadi M.D.   On: 02/07/2022 12:19     Medications:    sodium chloride Stopped (01/19/22 1845)   sodium thiosulfate 25 g in sodium chloride 0.9 % 200 mL Infusion for Calciphylaxis 25 g (02/06/22 1741)    vitamin C  500 mg Oral BID   bisacodyl  10 mg Rectal Once   Chlorhexidine Gluconate Cloth  6 each Topical Q0600   epoetin (EPOGEN/PROCRIT) injection  10,000 Units Intravenous Q M,W,F-HD   fentaNYL  1 patch Transdermal Q72H   fluticasone  2 spray Each Nare Daily   gabapentin  200 mg Oral TID   insulin aspart  0-5 Units Subcutaneous QHS   insulin aspart  0-9 Units Subcutaneous TID WC   insulin aspart  3 Units Subcutaneous TID WC   insulin glargine-yfgn  15 Units Subcutaneous QHS   leptospermum manuka honey  1 Application Topical Daily   loratadine  10 mg Oral Daily   metoprolol succinate  25 mg Oral BID   multivitamin  1 tablet Oral QHS   nutrition supplement (JUVEN)  1 packet Oral BID BM   polyethylene glycol  17 g Oral Daily   Ensure Max Protein  11 oz Oral BID   senna-docusate  1 tablet Oral BID    Assessment/ Plan:     Principal Problem:   Severe  sepsis ([place]) Active Problems:   OSA (obstructive sleep apnea)   Chronic kidney disease, stage 5 (HCC)   Essential hypertension   DNR (do not resuscitate)/DNI(Do Not Intubate)   Type 2 diabetes mellitus with chronic kidney disease, with long-term current use of insulin (HCC)   Hypomagnesemia   Pressure injury of skin   Symptomatic anemia   Elevated LFTs   Sacral wound, initial encounter   Swelling of left lower extremity   Abdominal pain   AKI (acute kidney injury) ([place])   Hyponatremia   Premature atrial complexes   Anasarca associated with disorder of kidney   Calciphylaxis   ESRD (end stage renal disease)  ([place])  66 year old female with history of hypertension, diabetes, peripheral vascular disease, hyperlipidemia, now with sepsis and acute kidney injury on the top of chronic kidney disease.  Patient is reached end-stage renal disease and was started on renal replacement therapy.   2D echo from January 03, 2022-:LVEF 60 to 65%, no regional wall motion abnormalities, asymmetric left ventricular hypertrophy of the basal septal segment.  Diastolic parameters normal.   #: End-stage renal disease on dialysis: Patient was last dialyzed on Friday No need for dialysis today Renal navigator continues to seek outpatient dialysis clinic.   Patient does have updated hepatitis B labs and chest x-ray ordered for outpatient placement.   #: Sepsis/fever likely from sacral wound. Biopsy negative for calciphylaxis. Prescribed Meropenem, with plans to hold. Negative pressure wound VAC remains in place and managed by surgery team.  Sodium thiosulfate ordered with dialysis treatments.   #: Anemia secondary to chronic kidney disease complicated by sepsis. Has received blood transfusions during this admission.  Lab Results  Component Value Date   HGB 9.0 (L) 02/06/2022  Hemoglobin within acceptable target.  We will continue Epogen with dialysis treatments.   #: Secondary hyperparathyroidism:  We will continue to follow.  Continue nutritional supplements.  # Diabetes with CKD Lab Results  Component Value Date   HGBA1C 10.0 (H) 12/18/2021    Primary team to manage SSI    LOS: 35 [address] [place] s [facility] 7/9/20236:03 AM

## 2022-02-08 NOTE — Progress Notes (Signed)
Patient refused air mattress, she is able to verbalize the importance of minimizing pressure on her wounds and the purpose of the specialty mattress. MD aware, patient switched to a regular mattress

## 2022-02-09 DIAGNOSIS — R6520 Severe sepsis without septic shock: Secondary | ICD-10-CM | POA: Diagnosis not present

## 2022-02-09 DIAGNOSIS — A419 Sepsis, unspecified organism: Secondary | ICD-10-CM | POA: Diagnosis not present

## 2022-02-09 LAB — GLUCOSE, CAPILLARY
Glucose-Capillary: 96 mg/dL (ref 70–99)
Glucose-Capillary: 142 mg/dL — ABNORMAL HIGH (ref 70–99)
Glucose-Capillary: 119 mg/dL — ABNORMAL HIGH (ref 70–99)

## 2022-02-09 LAB — CBC
HCT: 26.3 % — ABNORMAL LOW (ref 36.0–46.0)
Hemoglobin: 8.2 g/dL — ABNORMAL LOW (ref 12.0–15.0)
MCH: 27.4 pg (ref 26.0–34.0)
MCHC: 31.2 g/dL (ref 30.0–36.0)
MCV: 88 fL (ref 80.0–100.0)
Platelets: 282 10*3/uL (ref 150–400)
RBC: 2.99 MIL/uL — ABNORMAL LOW (ref 3.87–5.11)
RDW: 16.9 % — ABNORMAL HIGH (ref 11.5–15.5)
WBC: 13.9 10*3/uL — ABNORMAL HIGH (ref 4.0–10.5)
nRBC: 0 % (ref 0.0–0.2)

## 2022-02-09 LAB — RENAL FUNCTION PANEL
Albumin: 2 g/dL — ABNORMAL LOW (ref 3.5–5.0)
Anion gap: 18 — ABNORMAL HIGH (ref 5–15)
BUN: 59 mg/dL — ABNORMAL HIGH (ref 8–23)
CO2: 22 mmol/L (ref 22–32)
Calcium: 10 mg/dL (ref 8.9–10.3)
Chloride: 97 mmol/L — ABNORMAL LOW (ref 98–111)
Creatinine, Ser: 4.58 mg/dL — ABNORMAL HIGH (ref 0.44–1.00)
GFR, Estimated: 10 mL/min — ABNORMAL LOW (ref >60)
Glucose, Bld: 144 mg/dL — ABNORMAL HIGH (ref 70–99)
Phosphorus: 4 mg/dL (ref 2.5–4.6)
Potassium: 4.4 mmol/L (ref 3.5–5.1)
Sodium: 137 mmol/L (ref 135–145)

## 2022-02-09 LAB — HEPATITIS B SURFACE ANTIBODY, QUANTITATIVE: Hep B S AB Quant (Post): 95.8 m[IU]/mL (ref >9.9)

## 2022-02-09 MED ORDER — HEPARIN SODIUM (PORCINE) 1000 UNIT/ML IJ SOLN
INTRAMUSCULAR | Status: AC
  Filled 2022-02-09: qty 10

## 2022-02-09 MED ORDER — MORPHINE SULFATE (PF) 2 MG/ML IV SOLN: 2.0000 mg | Freq: Every day | INTRAVENOUS | Status: DC | PRN

## 2022-02-09 MED ORDER — EPOETIN ALFA 10000 UNIT/ML IJ SOLN
INTRAMUSCULAR | Status: AC
  Filled 2022-02-09: qty 1

## 2022-02-09 MED ORDER — ACETAMINOPHEN 325 MG PO TABS
ORAL_TABLET | ORAL | Status: AC
  Filled 2022-02-09: qty 2

## 2022-02-09 NOTE — Progress Notes (Signed)
Treatment Date: 02/09/2022   Treatment Time: 3 hours    Access: Right Chest CVC   UF Removed: 1000 ml   Next Treatment: 02/11/2022   Note:   HD treatment completed. Tolerated well, no adverse effects noted or reported. Patient hemodynamically stable. Report given to floor nurse Wyn Forster, RN.

## 2022-02-09 NOTE — Inpatient Diabetes Management (Signed)
Inpatient Diabetes Program Recommendations  AACE/ADA: New Consensus Statement on Inpatient Glycemic Control  Target Ranges:  Prepandial:   less than 140 mg/dL      Peak postprandial:   less than 180 mg/dL (1-2 hours)      Critically ill patients:  140 - 180 mg/dL    Latest Reference Range & Units 02/08/22 08:01 02/08/22 12:30 02/08/22 17:06 02/08/22 18:13 02/08/22 20:13 02/09/22 07:40  Glucose-Capillary 70 - 99 mg/dL 161 (H) 168 (H) 62 (L) 112 (H) 139 (H) 96   Review of Glycemic Control  Diabetes history:  DM2 Outpatient Diabetes medications: Humulin R U500 30 unit mark on U100 syringe in the morning, Humulin R U500 25 unit mark on U100 syringe in the evening Current orders for Inpatient glycemic control: Semglee 15 units QHS, Novolog 0-9 units TID with meals, Novolog 0-5 units QHS, Novolog 3 units TID with meals  Inpatient Diabetes Program Recommendations:    Insulin: Nursing: please be sure patient eats at least 50% of meals when giving meal coverage insulin. IF patient is not eating at least 50% of meals, then meal coverage should be held. Meal coverage parameters are:  Assumes patient eating 50 % or more of meal **  * Do NOT give if CBG < 80 or if patient is on premixed insulin.  * Do NOT give if meal intake is less than 50 %   Thanks, Barnie Alderman, RN, MSN, [place] [facility] Coordinator Inpatient Diabetes Program [phone] (Team Pager from 8am to [place])

## 2022-02-09 NOTE — Progress Notes (Signed)
[facility] [place] NAME: Theresa Warren    MR#:  [id number]  DATE OF BIRTH:  [birth year]  SUBJECTIVE:  Wound care RN checked wounds today.     VITALS:  Blood pressure (!) 160/108, pulse 98, temperature 97.9 F (36.6 C), temperature source Oral, resp. rate 20, height 5\' 3"  (1.6 m), weight 100.9 kg, SpO2 100 %.  PHYSICAL EXAMINATION:   Constitutional: NAD, AAOx3 HEENT: conjunctivae and lids normal, EOMI CV: No cyanosis.   RESP: normal respiratory effort, on RA Neuro: II - XII grossly intact.   Psych: depressed mood and affect.  Appropriate judgement and reason   LABORATORY PANEL:  CBC Recent Labs  Lab 02/09/22 1210  WBC 13.9*  HGB 8.2*  HCT 26.3*  PLT 282    Chemistries  Recent Labs  Lab 02/09/22 1210  NA 137  K 4.4  CL 97*  CO2 22  GLUCOSE 144*  BUN 59*  CREATININE 4.58*  CALCIUM 10.0    Assessment and Plan Theresa Warren is a 66 year old female hyperlipidemia, hypertension, CKD stage V, insulin-dependent diabetes mellitus, who presents emergency department from [facility] for evaluation of low sodium noted on labs at the facility.  She was discharged to rehab on 5/23 after being admitted following a fall and also with acute on chronic anemia.  Patient has been here for last 27 days. Please review detailed progress note in [facility] if needed.  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 Steva Ready has messaged dermatology to discuss and give curbside consultation -- currently off antibiotic Plan: --wound check today by wound care RN, dressing change reduced to MWF per pt request. --continue aggressive wound care --discussed  pain management--for now cont fentanyl patch +oxycodone.   Bilateral sacral wounds --status post debridement with surgery --cont wound care per order  End-stage renal disease on hemodialysis -- She is having a very hard time sitting up for dialysis given her morbid obesity significant pain from her skin lesions --iHD per nephro --need to set up outpatient dialysis before discharge  generalized weakness, deconditioning --pt decided to consider SNF rehab on 7/5  anemia of chronic disease in the setting of ESRD -- status post two unit blood transfusion -- no evidence of any active bleeding. -- No history of vaginally bleeding. -- CT scan of the abdomen showed abnormal endometrial thickening for postmenopausal woman ---outpatient f/u with Gyn --Epo with dialysis  hypertension  --cont Toprol  Type 2 Dm with end-stage renal disease, long-term use of insulin --cont glargine to 15u daily --cont mealtime 3u TID --SSI   Pressure Injury 12/20/21 Buttocks Left Stage 4 - Full thickness tissue loss with exposed bone, tendon or muscle. open area right below coccyx (Active)  12/20/21 0800  Location: Buttocks  Location Orientation: Left  Staging: Stage 4 - Full thickness tissue loss with exposed bone, tendon or muscle.  Wound Description (Comments): open area right below coccyx  Present on Admission: Yes (First day with patient. Unknown if present at admission.)     Pressure Injury 01/01/22 Buttocks Left Stage 3 -  Full thickness tissue loss. Subcutaneous fat may be visible but bone, tendon or muscle are NOT exposed. (Active)  01/01/22 0222  Location: Buttocks  Location Orientation:  Left  Staging: Stage 3 -  Full thickness tissue loss. Subcutaneous fat may be visible but bone, tendon or muscle are NOT exposed.  Wound Description (Comments):   Present on Admission: Yes     Pressure Injury 01/01/22 Buttocks Right Stage 4 - Full thickness tissue loss with exposed bone, tendon or muscle.  (Active)  01/01/22 0223  Location: Buttocks  Location Orientation: Right  Staging: Stage 4 - Full thickness tissue loss with exposed bone, tendon or muscle.  Wound Description (Comments):   Present on Admission: Yes     Pressure Injury 01/01/22 Hip Left Stage 4 - Full thickness tissue loss with exposed bone, tendon or muscle. (Active)  01/01/22 0225  Location: Hip  Location Orientation: Left  Staging: Stage 4 - Full thickness tissue loss with exposed bone, tendon or muscle.  Wound Description (Comments):   Present on Admission: Yes     Pressure Injury 01/01/22 Hip Right Stage 3 -  Full thickness tissue loss. Subcutaneous fat may be visible but bone, tendon or muscle are NOT exposed. (Active)  01/01/22 0225  Location: Hip  Location Orientation: Right  Staging: Stage 3 -  Full thickness tissue loss. Subcutaneous fat may be visible but bone, tendon or muscle are NOT exposed.  Wound Description (Comments):   Present on Admission: Yes     Pressure Injury 01/12/22 Thigh Left;Upper Unstageable - Full thickness tissue loss in which the base of the injury is covered by slough (yellow, tan, gray, green or brown) and/or eschar (tan, brown or black) in the wound bed. previously noted "rugburns"  (Active)  01/12/22   Location: Thigh  Location Orientation: Left;Upper  Staging: Unstageable - Full thickness tissue loss in which the base of the injury is covered by slough (yellow, tan, gray, green or brown) and/or eschar (tan, brown or black) in the wound bed.  Wound Description (Comments): previously noted "rugburns" have evolved  Present on Admission: Yes     Pressure Injury 01/12/22 Thigh Left;Posterior Unstageable - Full thickness tissue loss in which the base of the injury is covered by slough (yellow, tan, gray, green or brown) and/or eschar (tan, brown or black) in the wound bed. previously noted " rugbu (Active)  01/12/22   Location: Thigh  Location Orientation: Left;Posterior  Staging:  Unstageable - Full thickness tissue loss in which the base of the injury is covered by slough (yellow, tan, gray, green or brown) and/or eschar (tan, brown or black) in the wound bed.  Wound Description (Comments): previously noted " rugburns" have evolved  Present on Admission: Yes     Pressure Injury 02/05/22 Thigh Right;Lateral;Upper Unstageable - Full thickness tissue loss in which the base of the injury is covered by slough (yellow, tan, gray, green or brown) and/or eschar (tan, brown or black) in the wound bed. (Active)  02/05/22 0500  Location: Thigh  Location Orientation: Right;Lateral;Upper  Staging: Unstageable - Full thickness tissue loss in which the base of the injury is covered by slough (yellow, tan, gray, green or brown) and/or eschar (tan, brown or black) in the wound bed.  Wound Description (Comments):   Present on Admission: Yes    DVT prophylaxis: SCD/Compression stockings Code Status: DNR  Family Communication:  Status is: inpatient Dispo:   The patient is from: home Anticipated d/c is to: SNF Anticipated d/c date is: undetermined Patient currently is not medically stable to d/c due to: outpatient dialysis not set up yet   Enzo Bi M.D    [facility]

## 2022-02-09 NOTE — TOC Progression Note (Signed)
Transition of Care [facility]) - Progression Note    Patient Details  Name: Theresa Warren MRN: [id number] Date of Birth: [birth year]  Transition of Care [facility]) CM/SW [place], RN Phone Number: 02/09/2022, 2:51 PM  Clinical Narrative:     Ins auth started with [facility] ref number [id number], pending  Expected Discharge Plan: [place] Barriers to Discharge: Continued Medical Work up  Expected Discharge Plan and Services Expected Discharge Plan: [place]   Discharge Planning Services: CM Consult Post Acute Care Choice:  (TBD) Living arrangements for the past 2 months: Single Family Home                                       Social Determinants of Health (SDOH) Interventions    Readmission Risk Interventions     No data to display

## 2022-02-09 NOTE — TOC Progression Note (Signed)
Transition of Care [facility]) - Progression Note    Patient Details  Name: Theresa Warren MRN: [id number] Date of Birth: 1955/08/28  Transition of Care [facility]) CM/SW Contact  Beverly Sessions, RN Phone Number: 02/09/2022, 9:06 AM  Clinical Narrative:     Patient deferring decision on SNF to friend [name].  Reached out to [facility] [place].  She states they would like to accept bed at [facility].  Accepted bed in [place] and notified Tanya at [place] HD coordinator so she is aware that patient will be staying in [facility].   Expected Discharge Plan: [place] Barriers to Discharge: Continued Medical Work up  Expected Discharge Plan and Services Expected Discharge Plan: [place]   Discharge Planning Services: CM Consult Post Acute Care Choice:  (TBD) Living arrangements for the past 2 months: Single Family Home                                       Social Determinants of Health (SDOH) Interventions    Readmission Risk Interventions     No data to display

## 2022-02-09 NOTE — Progress Notes (Signed)
Physical Therapy Treatment Patient Details Name: Theresa Warren MRN: [id number] DOB: [birth year] Today's Date: 02/09/2022   History of Present Illness Pt. is a 66 year old female admitted for evaluation of low sodium noted on labs at the facility.  Pt was discharged  to rehab on 5/23 after being admitted following a fall. PMH significant for acute on chronic anemia. hyperlipidemia, hypertension, CKD stage V, insulin-dependent diabetes mellitus.   PT Comments    Pt received sitting EOB having just transferred from HD chair to bed with nursing. Nursing present at start of session to assist. Pt appeared anxious, tearful, and mumbled throughout duration of session. Pt reported elevated pain (nursing had just provided pain meds prior to PT session) in buttocks/bilat LE due to wounds. Pt stated she thinks she has to have a BM. PT and nurse attempted transfer to [facility] with pt unable to stand with Max Ax2 and RW despite pt transferring from HD chair to bed with nursing prior to session. Pt completed x10 LAQs bilaterally while seated EOB. Pt required Max A for doffing/donning clean hospital gown. Pt required Mod A for bilat LE mgmt to lie back supine and total assist to scoot toward HOB in supine. Continue to recommend STR at discharge.   Recommendations for follow up therapy are one component of a multi-disciplinary discharge planning process, led by the attending physician.  Recommendations may be updated based on patient status, additional functional criteria and insurance authorization.  Follow Up Recommendations  Skilled nursing-short term rehab (<3 hours/day) Can patient physically be transported by private vehicle: No   Assistance Recommended at Discharge Frequent or constant Supervision/Assistance  Patient can return home with the following Two people to help with walking and/or transfers;Two people to help with bathing/dressing/bathroom;Assistance with cooking/housework;Assist for  transportation;Help with stairs or ramp for entrance   Equipment Recommendations  Rolling walker (2 wheels);BSC/3in1;Wheelchair cushion (measurements PT);Wheelchair (measurements PT);Hospital bed    Recommendations for Other Services       Precautions / Restrictions Precautions Precautions: Fall Precaution Comments: sacral & leg wounds Restrictions Weight Bearing Restrictions: No     Mobility  Bed Mobility Overal bed mobility: Needs Assistance Bed Mobility: Sit to Supine       Sit to supine: Mod assist, Total assist   General bed mobility comments: pt required Mod A for bilat LE mgmt to transfer sit to supine and total assist to scoot toward [facility]    Transfers Overall transfer level: Needs assistance Equipment used: Rolling walker (2 wheels) Transfers: Sit to/from Stand Sit to Stand: +2 physical assistance, +2 safety/equipment, Max assist           General transfer comment: attempted sit-to-stand for transfer to [facility]. pt unable to stand fully at this time with Max Ax2 and RW. Verbal cues for hand placement and forward lean, but pt unable to stand.    Ambulation/Gait               General Gait Details: NT this date; unsafe/pt unable to tolerate   Stairs             Wheelchair Mobility    Modified Rankin (Stroke Patients Only)       Balance Overall balance assessment: Needs assistance Sitting-balance support: Feet supported, Bilateral upper extremity supported Sitting balance-Leahy Scale: Fair Sitting balance - Comments: Pt able to static sit EOB and lift UE for placement of gait belt without LOB; able to perform LAQs seated EOB without LOB   Standing balance support: Bilateral upper extremity supported,  During functional activity Standing balance-Leahy Scale: Zero Standing balance comment: Pt unable to fully stand this date with Max Ax2 despite just having transferred from HD chair to bed with nursing prior to session.                             Cognition Arousal/Alertness: Awake/alert Behavior During Therapy: WFL for tasks assessed/performed Overall Cognitive Status: No family/caregiver present to determine baseline cognitive functioning Area of Impairment: Attention, Following commands, Awareness, Safety/judgement, Problem solving                   Current Attention Level: Selective   Following Commands: Follows one step commands with increased time, Follows one step commands inconsistently Safety/Judgement: Decreased awareness of safety, Decreased awareness of deficits   Problem Solving: Slow processing, Decreased initiation, Difficulty sequencing, Requires verbal cues, Requires tactile cues General Comments: Pt appeared anxious this date tearing up and mumbling through duration of session. Unable to calm pt through education of deep breathing.        Exercises Other Exercises Other Exercises: Pt performed x10 reps ea of seated LAQs while sitting EOB. Other Exercises: Max A for doffing/donning new hospital gown in sitting    General Comments        Pertinent Vitals/Pain Pain Assessment Pain Assessment: Faces Faces Pain Scale: Hurts whole lot Pain Location: pt's bottom and B LE's Pain Descriptors / Indicators: Grimacing, Guarding Pain Intervention(s): Limited activity within patient's tolerance, Monitored during session, Premedicated before session, Repositioned    Home Living                          Prior Function            PT Goals (current goals can now be found in the care plan section) Acute Rehab PT Goals Patient Stated Goal: to improve strength and mobility PT Goal Formulation: With patient Time For Goal Achievement: 02/19/22 Potential to Achieve Goals: Fair Progress towards PT goals: Progressing toward goals    Frequency    Min 2X/week      PT Plan Current plan remains appropriate    Co-evaluation              AM-PAC PT "6 Clicks" Mobility    Outcome Measure  Help needed turning from your back to your side while in a flat bed without using bedrails?: A Little Help needed moving from lying on your back to sitting on the side of a flat bed without using bedrails?: A Lot Help needed moving to and from a bed to a chair (including a wheelchair)?: A Lot Help needed standing up from a chair using your arms (e.g., wheelchair or bedside chair)?: A Lot Help needed to walk in hospital room?: A Lot Help needed climbing 3-5 steps with a railing? : Total 6 Click Score: 12    End of Session Equipment Utilized During Treatment: Gait belt Activity Tolerance: Patient limited by pain Patient left: in bed;with call bell/phone within reach;with bed alarm set Nurse Communication: Mobility status PT Visit Diagnosis: Unsteadiness on feet (R26.81);Muscle weakness (generalized) (M62.81);Difficulty in walking, not elsewhere classified (R26.2)     Time: 2706-2376 PT Time Calculation (min) (ACUTE ONLY): 29 min  Charges:                        Rella Larve, SPT  Mihran Lebarron 02/09/2022, 4:47 PM

## 2022-02-09 NOTE — Progress Notes (Signed)
PT Cancellation Note  Patient Details Name: Theresa Warren MRN: [id number] DOB: [birth year]   Cancelled Treatment:    Reason Eval/Treat Not Completed: Other (comment). Pt currently out of room for HD at this time, will re-attempt another session.   Berneda Piccininni 02/09/2022, 1:27 PM Greggory Stallion, PT, DPT, GCS [phone]

## 2022-02-09 NOTE — Consult Note (Addendum)
[place] Nurse Re-consult Note: Refer to multiple previous consult notes, most recent was performed on 6/23, when wound was assessed with surgical PA present. Both services signed off at that time.  Requested to re-assess wounds. Pt has declined an air mattresses and prefers the regular mattress.  She is requesting the dressings be performed a minimal amt of times during the week related to extreme discomfort.  Discussed that Pt has multiple areas of necrotic wounds which do not have the potential for healing with topical treatment, many more then upon admission, and all have declined, and these are related to a chronic condition which was revealed by the wound biopsy. Pt became upset and stated that no one had told her this. There are multiple systemic factors which can impair healing and Pt declines any further surgery. 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 All other wounds to bilat groins, anterior and posterior thighs, flanks are 100% dry eschar. All are painful to touch and although she was medicated for pain prior to the procedure, Pt was crying during the dressing changes.   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 The pattern present though nonspecific could be related to causes of microthrombi including as mentioned previously disseminated intravascular coagulation and also heparin necrosis or Coumadin necrosis.  A procoagulant phenomena such as that related to  antiphospholipid antibody or lupus anticoagulant may also be considered."  Palliative care was consulted on 6/22.  Recommend re-consult to establish goals of care and increase in medication for pain control.   Plan of care: Topical treatment orders provided for bedside nurses to perform to promote drying and absorb drainage as follows: Apply Betadine-moistened gauze to all  wounds Q M/W/F, then cover with abd pads and paper tape.   Please re-consult if further assistance is needed.  Thank-you,   Julien Girt MSN, [place], [place], [place], [place]

## 2022-02-09 NOTE — Progress Notes (Signed)
Working on placement at [facility] for SNF placement at [facility].

## 2022-02-09 NOTE — Progress Notes (Signed)
[facility]  PROGRESS NOTE   Subjective:   Patient seen and evaluated during dialysis   HEMODIALYSIS FLOWSHEET:  Blood Flow Rate (mL/min): 400 mL/min Arterial Pressure (mmHg): -170 mmHg Venous Pressure (mmHg): 130 mmHg Transmembrane Pressure (mmHg): 50 mmHg Ultrafiltration Rate (mL/min): 500 mL/min Dialysate Flow Rate (mL/min): 600 ml/min Conductivity: 14 Conductivity: 14 Dialysis Fluid Bolus: Normal Saline Bolus Amount (mL): 250 mL  Patient appears tearful today, unable to voice her concerns at this time.  Complains of generalized pain and discomfort   Objective:  Vital signs: Blood pressure (!) 131/52, pulse 94, temperature 98.4 F (36.9 C), temperature source Oral, resp. rate 16, height 5\' 3"  (1.6 m), weight 100.9 kg, SpO2 100 %.  Intake/Output Summary (Last 24 hours) at 02/09/2022 1227 Last data filed at 02/09/2022 0738 Gross per 24 hour  Intake --  Output 300 ml  Net -300 ml    Filed Weights   02/08/22 0500 02/09/22 0500  Weight: 104 kg 100.9 kg     Physical Exam: General:  No acute distress  Head:  Normocephalic, atraumatic. Moist oral mucosal membranes  Eyes:  Anicteric  Lungs:   Clear to auscultation, normal effort  Heart:  S1S2 no rubs  Abdomen:   Soft, nontender, bowel sounds present  Extremities:  Trace peripheral edema.  Neurologic:  Awake, alert, following commands  Skin:  Sacral wounds  Access:  Right IJ PermCath    Basic Metabolic Panel: Recent Labs  Lab 02/04/22 1742 02/06/22 1332  NA 138 138  K 3.9 4.5  CL 96* 97*  CO2 26 23  GLUCOSE 230* 159*  BUN 25* 45*  CREATININE 2.48* 4.23*  CALCIUM 9.1 10.3  PHOS 1.7* 3.1     CBC: Recent Labs  Lab 02/04/22 1742 02/06/22 1332 02/09/22 1210  WBC 11.5* 14.4* 13.9*  HGB 9.2* 9.0* 8.2*  HCT 29.5* 28.7* 26.3*  MCV 86.5 87.5 88.0  PLT 262 350 282      Urinalysis: No results for input(s): "COLORURINE", "LABSPEC", "PHURINE", "GLUCOSEU", "HGBUR", "BILIRUBINUR",  "KETONESUR", "PROTEINUR", "UROBILINOGEN", "NITRITE", "LEUKOCYTESUR" in the last 72 hours.  Invalid input(s): "APPERANCEUR"    Imaging: No results found.   Medications:    sodium chloride Stopped (01/19/22 1845)   sodium thiosulfate 25 g in sodium chloride 0.9 % 200 mL Infusion for Calciphylaxis 25 g (02/06/22 1741)    vitamin C  500 mg Oral BID   bisacodyl  10 mg Rectal Once   Chlorhexidine Gluconate Cloth  6 each Topical Q0600   epoetin (EPOGEN/PROCRIT) injection  10,000 Units Intravenous Q M,W,F-HD   fentaNYL  1 patch Transdermal Q72H   fluticasone  2 spray Each Nare Daily   gabapentin  200 mg Oral TID   insulin aspart  0-5 Units Subcutaneous QHS   insulin aspart  0-9 Units Subcutaneous TID WC   insulin aspart  3 Units Subcutaneous TID WC   insulin glargine-yfgn  15 Units Subcutaneous QHS   loratadine  10 mg Oral Daily   metoprolol succinate  25 mg Oral BID   multivitamin  1 tablet Oral QHS   nutrition supplement (JUVEN)  1 packet Oral BID BM   polyethylene glycol  17 g Oral Daily   Ensure Max Protein  11 oz Oral BID   senna-docusate  1 tablet Oral BID    Assessment/ Plan:     Principal Problem:   Severe sepsis (HCC) Active Problems:   OSA (obstructive sleep apnea)   Chronic kidney disease, stage 5 ([place])   Essential  hypertension   DNR (do not resuscitate)/DNI(Do Not Intubate)   Type 2 diabetes mellitus with chronic kidney disease, with long-term current use of insulin (HCC)   Hypomagnesemia   Pressure injury of skin   Symptomatic anemia   Elevated LFTs   Sacral wound, initial encounter   Swelling of left lower extremity   Abdominal pain   AKI (acute kidney injury) ([place])   Hyponatremia   Premature atrial complexes   Anasarca associated with disorder of kidney   Calciphylaxis   ESRD (end stage renal disease) ([place])  66 year old female with history of hypertension, diabetes, peripheral vascular disease, hyperlipidemia, now with sepsis and acute kidney injury on  the top of chronic kidney disease.  Patient is reached end-stage renal disease and was started on renal replacement therapy.   2D echo from January 03, 2022-:LVEF 60 to 65%, no regional wall motion abnormalities, asymmetric left ventricular hypertrophy of the basal septal segment.  Diastolic parameters normal.   #: End-stage renal disease on dialysis: Receiving scheduled dialysis today, UF goal 1 L as tolerated. Patient is HCPOA has accepted bed at [facility].  Renal navigator aware and attempting placement at [facility].   #: Sepsis/fever likely from sacral wound. Biopsy negative for calciphylaxis. Prescribed Meropenem, with plans to hold. Negative pressure wound VAC remains in place and managed by surgery team.  Continue sodium thiosulfate with dialysis treatments.   #: Anemia secondary to chronic kidney disease complicated by sepsis. Has received blood transfusions during this admission.  Lab Results  Component Value Date   HGB 8.2 (L) 02/09/2022  Hemoglobin below desired target.  Continue EPO with dialysis treatments.   #: Secondary hyperparathyroidism:  We will continue to follow.  Continue nutritional supplements.  # Diabetes with CKD Lab Results  Component Value Date   HGBA1C 10.0 (H) 12/18/2021    Primary team to manage SSI    LOS: [facility] 7/10/202312:27 PM

## 2022-02-10 DIAGNOSIS — R6520 Severe sepsis without septic shock: Secondary | ICD-10-CM | POA: Diagnosis not present

## 2022-02-10 DIAGNOSIS — A419 Sepsis, unspecified organism: Secondary | ICD-10-CM | POA: Diagnosis not present

## 2022-02-10 LAB — GLUCOSE, CAPILLARY
Glucose-Capillary: 97 mg/dL (ref 70–99)
Glucose-Capillary: 76 mg/dL (ref 70–99)
Glucose-Capillary: 67 mg/dL — ABNORMAL LOW (ref 70–99)
Glucose-Capillary: 106 mg/dL — ABNORMAL HIGH (ref 70–99)

## 2022-02-10 MED ORDER — SODIUM THIOSULFATE 250 MG/ML IV SOLN: 25.0000 g | INTRAVENOUS | Status: DC

## 2022-02-10 MED ORDER — OXYCODONE HCL 5 MG PO TABS: 5.0000 mg | ORAL_TABLET | Freq: Four times a day (QID) | ORAL | 0 refills | Status: DC | PRN

## 2022-02-10 MED ORDER — GABAPENTIN 100 MG PO CAPS: 200.0000 mg | ORAL_CAPSULE | Freq: Three times a day (TID) | ORAL | Status: AC

## 2022-02-10 MED ORDER — EPOETIN ALFA 10000 UNIT/ML IJ SOLN: 10000.0000 [IU] | INTRAMUSCULAR | Status: AC

## 2022-02-10 MED ORDER — INSULIN GLARGINE-YFGN 100 UNIT/ML ~~LOC~~ SOLN: 15.0000 [IU] | Freq: Every day | SUBCUTANEOUS | Status: AC

## 2022-02-10 MED ORDER — RENA-VITE PO TABS
1.0000 | ORAL_TABLET | Freq: Every day | ORAL | 0 refills | Status: AC
Start: 2022-02-10 — End: ?

## 2022-02-10 MED ORDER — JUVEN PO PACK
1.0000 | PACK | Freq: Two times a day (BID) | ORAL | 0 refills | Status: AC
Start: 2022-02-10 — End: ?

## 2022-02-10 MED ORDER — SENNOSIDES-DOCUSATE SODIUM 8.6-50 MG PO TABS: 1.0000 | ORAL_TABLET | Freq: Two times a day (BID) | ORAL | Status: AC

## 2022-02-10 MED ORDER — INSULIN ASPART 100 UNIT/ML IJ SOLN: 3.0000 [IU] | Freq: Three times a day (TID) | INTRAMUSCULAR | Status: DC

## 2022-02-10 MED ORDER — POLYETHYLENE GLYCOL 3350 17 G PO PACK: 17.0000 g | PACK | Freq: Every day | ORAL | 0 refills | Status: AC

## 2022-02-10 MED ORDER — ASCORBIC ACID 500 MG PO TABS: 500.0000 mg | ORAL_TABLET | Freq: Two times a day (BID) | ORAL | Status: AC

## 2022-02-10 MED ORDER — ENSURE MAX PROTEIN PO LIQD: 11.0000 [oz_av] | Freq: Two times a day (BID) | ORAL | Status: AC

## 2022-02-10 NOTE — TOC Transition Note (Signed)
Transition of Care [facility]) - CM/SW Discharge Note   Patient Details  Name: Theresa Warren MRN: [id number] Date of Birth: 1955-12-12  [facility] CM/SW Contact:  Beverly Sessions, RN Phone Number: 02/10/2022, 1:44 PM   Clinical Narrative:     Patient will DC to: [facility]  Anticipated DC date: 02/10/22  Family notified: VM left for friend susan Transport by: Johnanna Schneiders  Per MD patient ready for DC to . RN, patient, patient's family, and facility notified of DC. Discharge Summary sent to facility. RN given number for report. DC packet on chart. Ambulance transport requested for patient.  TOC signing off.   Patient has outpatient HD set up and will start Thursday  Notified Tanya at [facility] that patient would need a hoyer lift pad for HD transport  [place] [phone]     Barriers to Discharge: Continued Medical Work up   Patient Goals and CMS Choice        Discharge Placement                       Discharge Plan and Services   Discharge Planning Services: CM Consult Post Acute Care Choice:  (TBD)                               Social Determinants of Health (SDOH) Interventions     Readmission Risk Interventions     No data to display

## 2022-02-10 NOTE — Discharge Summary (Addendum)
Physician Discharge Summary   Theresa Warren  female DOB: 15-Apr-1956  WIO:[id number]  PCP: Warren Infante, MD  Admit date: 12/31/2021 Discharge date: 02/10/2022  Admitted From: SNF Disposition:  SNF Friend [name] updated on the phone prior to discharge. CODE STATUS: DNR  Discharge Instructions     Discharge wound care:   Complete by: As directed    Apply Betadine-moistened gauze to all wounds Q M/W/F, then cover with abd pads and paper tape. [facility] Course:  For full details, please see H&P, progress notes, consult notes and ancillary notes.  Briefly,  Theresa Warren is a 66 year old female with hx of hypertension, CKD stage V, insulin-dependent diabetes mellitus, who presented emergency department from [facility] for evaluation of low sodium noted on labs at the facility.  She was discharged to rehab on 5/23 after being admitted following a fall and also with acute on chronic anemia.   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 -- vasculitis workup so far has been negative --due to pain with dressing change, pt requested dressing change to be reduced to MWF.  Wound care instruction as above. --Pt was discharged on oxycodone 5 mg q6h for pain.  Can received 10 mg prior to dressing change. --Continue sodium thiosulfate with dialysis treatments. --need outpatient dermatology f/u.   Bilateral sacral wounds S/p INCISION AND DRAINAGE ABSCESS-Sacral Decubitus on 01/01/22 with Dr. Hampton Abbot --Bilateral buttock wounds which have been debrided.  Pressure ulcers VS calciphylaxis (very likely the latter)- staph aureus and enterococcus in culture Had completed 19 days of antibiotic and she was off antibiotics since 01/20/2022.  Then started meropenem on 01/24/22 due to low grade fever and leucocytosis  and she has completed 5 days of it, stopped 01/29/22 Fever and leucocytosis resolved- no further antibiotics --wound checked by wound care RN prior to discharge.  Cont dressing change per order above. --Discussed with pt about the importance of turning and pressure off-loading of her back side.     End-stage renal disease on hemodialysis --iHD per nephro --will start outpatient dialysis on Thursday 7/13 for a TTS schedule.   generalized weakness, deconditioning --pt decided to consider SNF rehab on 7/5   anemia of chronic disease in the setting of ESRD -- status post two unit blood transfusion -- no evidence of any active bleeding. 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 open area right below coccyx (Active)  12/20/21 0800  Location: Buttocks  Location Orientation: Left  Staging: Stage 4 - Full thickness tissue loss with exposed bone, tendon or muscle.  Wound Description (Comments): open area right below coccyx  Present on Admission: Yes (First day with patient. Unknown if present at admission.)     Pressure Injury 01/01/22 Buttocks Left Stage 3 -  Full thickness tissue loss. Subcutaneous fat may  be visible but bone, tendon or muscle are NOT exposed. (Active)  01/01/22 0222  Location: Buttocks  Location Orientation: Left  Staging: Stage 3 -  Full thickness tissue loss. Subcutaneous fat may be visible but bone, tendon or  muscle are NOT exposed.  Wound Description (Comments):   Present on Admission: Yes     Pressure Injury 01/01/22 Buttocks Right Stage 4 - Full thickness tissue loss with exposed bone, tendon or muscle. (Active)  01/01/22 0223  Location: Buttocks  Location Orientation: Right  Staging: Stage 4 - Full thickness tissue loss with exposed bone, tendon or muscle.  Wound Description (Comments):   Present on Admission: Yes     Pressure Injury 01/01/22 Hip Left Stage 4 - Full thickness tissue loss with exposed bone, tendon or muscle. (Active)  01/01/22 0225  Location: Hip  Location Orientation: Left  Staging: Stage 4 - Full thickness tissue loss with exposed bone, tendon or muscle.  Wound Description (Comments):   Present on Admission: Yes     Pressure Injury 01/01/22 Hip Right Stage 3 -  Full thickness tissue loss. Subcutaneous fat may be visible but bone, tendon or muscle are NOT exposed. (Active)  01/01/22 0225  Location: Hip  Location Orientation: Right  Staging: Stage 3 -  Full thickness tissue loss. Subcutaneous fat may be visible but bone, tendon or muscle are NOT exposed.  Wound Description (Comments):   Present on Admission: Yes     Pressure Injury 01/12/22 Thigh Left;Upper Unstageable - Full thickness tissue loss in which the base of the injury is covered by slough (yellow, tan, gray, green or brown) and/or eschar (tan, brown or black) in the wound bed. previously noted "rugburns"  (Active)  01/12/22   Location: Thigh  Location Orientation: Left;Upper  Staging: Unstageable - Full thickness tissue loss in which the base of the injury is covered by slough (yellow, tan, gray, green or brown) and/or eschar (tan, brown or black) in the wound bed.  Wound Description (Comments): previously noted "rugburns" have evolved  Present on Admission: Yes     Pressure Injury 01/12/22 Thigh Left;Posterior Unstageable - Full thickness tissue loss in which the base of the injury is covered by slough  (yellow, tan, gray, green or brown) and/or eschar (tan, brown or black) in the wound bed. previously noted " rugbu (Active)  01/12/22   Location: Thigh  Location Orientation: Left;Posterior  Staging: Unstageable - Full thickness tissue loss in which the base of the injury is covered by slough (yellow, tan, gray, green or brown) and/or eschar (tan, brown or black) in the wound bed.  Wound Description (Comments): previously noted " rugburns" have evolved  Present on Admission: Yes     Pressure Injury 02/05/22 Thigh Right;Lateral;Upper Unstageable - Full thickness tissue loss in which the base of the injury is covered by slough (yellow, tan, gray, green or brown) and/or eschar (tan, brown or black) in the wound bed. (Active)  02/05/22 0500  Location: Thigh  Location Orientation: Right;Lateral;Upper  Staging: Unstageable - Full thickness tissue loss in which the base of the injury is covered by slough (yellow, tan, gray, green or brown) and/or eschar (tan, brown or black) in the wound bed.  Wound Description (Comments):   Present on Admission: Yes    Unless noted above, medications under "STOP" list are ones pt was not taking PTA.  Discharge Diagnoses:  Principal Problem:   Severe sepsis ([place]) Active Problems:   Sacral wound, initial encounter   Swelling of left lower extremity  Chronic kidney disease, stage 5 (HCC)   AKI (acute kidney injury) ([place])   Hypomagnesemia   Premature atrial complexes   Symptomatic anemia   Abdominal pain   Hyponatremia   Essential hypertension   Type 2 diabetes mellitus with chronic kidney disease, with long-term current use of insulin (HCC)   DNR (do not resuscitate)/DNI(Do Not Intubate)   OSA (obstructive sleep apnea)   Pressure injury of skin   Elevated LFTs   Anasarca associated with disorder of kidney   Calciphylaxis   ESRD (end stage renal disease) ([place])   30 Day Unplanned Readmission Risk Score    Flowsheet Row ED to Hosp-Admission  (Current) from 12/31/2021 in [place]  30 Day Unplanned Readmission Risk Score (%) 27.74 Filed at 02/10/2022 1200       This score is the patient's risk of an unplanned readmission within 30 days of being discharged (0 -100%). The score is based on dignosis, age, lab data, medications, orders, and past utilization.   Low:  0-14.9   Medium: 15-21.9   High: 22-29.9   Extreme: 30 and above         Discharge Instructions:  Allergies as of 02/10/2022       Reactions   Codeine Nausea And Vomiting   Sulfa Antibiotics Swelling   Clindamycin/lincomycin Rash   Dilaudid [hydromorphone Hcl] Rash   Penicillins Rash        Medication List     STOP taking these medications    furosemide 20 MG tablet Commonly known as: LASIX   HUMULIN R 500 UNIT/ML injection Generic drug: insulin regular human CONCENTRATED   losartan 50 MG tablet Commonly known as: COZAAR   sodium bicarbonate 650 MG tablet   triamcinolone cream 0.1 % Commonly known as: KENALOG   Vitamin D (Ergocalciferol) 1.25 MG (50000 UNIT) Caps capsule Commonly known as: DRISDOL       TAKE these medications    allopurinol 100 MG tablet Commonly known as: ZYLOPRIM Take 100 mg by mouth daily.   ascorbic acid 500 MG tablet Commonly known as: VITAMIN C Take 1 tablet (500 mg total) by mouth 2 (two) times daily.   atorvastatin 40 MG tablet Commonly known as: LIPITOR Take 40 mg by mouth daily.   epoetin alfa 10000 UNIT/ML injection Commonly known as: EPOGEN Inject 1 mL (10,000 Units total) into the vein every Monday, Wednesday, and Friday with hemodialysis. Start taking on: February 11, 2022   fexofenadine 180 MG tablet Commonly known as: ALLEGRA Take 180 mg by mouth daily.   Fish Oil 500 MG Caps Take by mouth.   fluticasone 50 MCG/ACT nasal spray Commonly known as: Flonase Place 2 sprays into both nostrils daily.   gabapentin 100 MG capsule Commonly known as:  NEURONTIN Take 2 capsules (200 mg total) by mouth 3 (three) times daily.   insulin aspart 100 UNIT/ML injection Commonly known as: novoLOG Inject 3 Units into the skin 3 (three) times daily with meals.   insulin glargine-yfgn 100 UNIT/ML injection Commonly known as: SEMGLEE Inject 0.15 mLs (15 Units total) into the skin at bedtime.   metoprolol tartrate 25 MG tablet Commonly known as: LOPRESSOR Take 1 tablet (25 mg total) by mouth 2 (two) times daily.   multivitamin Tabs tablet Take 1 tablet by mouth at bedtime.   nutrition supplement (JUVEN) Pack Take 1 packet by mouth 2 (two) times daily between meals.   Ensure Max Protein Liqd Take 330 mLs (11 oz total) by  mouth 2 (two) times daily.   nystatin powder Commonly known as: MYCOSTATIN/NYSTOP Apply topically.   oxyCODONE 5 MG immediate release tablet Commonly known as: Oxy IR/ROXICODONE Take 1 tablet (5 mg total) by mouth every 6 (six) hours as needed for moderate pain or severe pain (can give 10 mg 30 minutes before dressing change).   polyethylene glycol 17 g packet Commonly known as: MIRALAX / GLYCOLAX Take 17 g by mouth daily. Start taking on: February 11, 2022   senna-docusate 8.6-50 MG tablet Commonly known as: Senokot-S Take 1 tablet by mouth 2 (two) times daily.   sodium chloride 0.9 % SOLN 100 mL with sodium thiosulfate 250 MG/ML SOLN 25 g Inject 25 g into the vein every Monday, Wednesday, and Friday with hemodialysis.               Durable Medical Equipment  (From admission, onward)           Start     Ordered   01/30/22 1223  For home use only DME Walker rolling  Once       Question Answer Comment  Walker: With [place] Wheels   Patient needs a walker to treat with the following condition General weakness      01/30/22 1222   01/30/22 1222  For home use only DME Hospital bed  Once       Question Answer Comment  Length of Need Lifetime   Bed type Semi-electric      01/30/22 1222   01/15/22 1611   For home use only DME Other see comment  Once       Comments: High profile roho cushion-  Patient seat width 19", seat depth 23"  Question:  Length of Need  Answer:  Lifetime   01/15/22 1612              Discharge Care Instructions  (From admission, onward)           Start     Ordered   02/10/22 0000  Discharge wound care:       Comments: Apply Betadine-moistened gauze to all wounds Q M/W/F, then cover with abd pads and paper tape. - -   02/10/22 1315             Contact information for follow-up providers     Warren Infante, MD Follow up.   Specialty: Internal Medicine Contact information: [place] [address] [phone]              Contact information for after-discharge care     [facility] .   Service: Skilled Nursing Contact information: [place] [address] [phone]                     Allergies  Allergen Reactions   Codeine Nausea And Vomiting   Sulfa Antibiotics Swelling   Clindamycin/Lincomycin Rash   Dilaudid [Hydromorphone Hcl] Rash   Penicillins Rash     The results of significant diagnostics from this hospitalization (including imaging, microbiology, ancillary and laboratory) are listed below for reference.   Consultations:   Procedures/Studies: DG Chest Port 1 View  Result Date: 02/07/2022 CLINICAL DATA:  Hyponatremia EXAM: PORTABLE CHEST 1 VIEW COMPARISON:  01/11/2021 FINDINGS: The heart size and mediastinal contours are within normal limits. Large-bore right neck vascular catheter. No acute airspace opacity. Unchanged elevation of the left hemidiaphragm with associated scarring or atelectasis. The visualized  skeletal structures are unremarkable. IMPRESSION: No acute abnormality of the lungs. Unchanged elevation of the left hemidiaphragm with associated scarring or atelectasis. Electronically Signed   By: Delanna Ahmadi  M.D.   On: 02/07/2022 12:19   [place] RT LOWER EXTREM LTD SOFT TISSUE NON VASCULAR  Result Date: 01/12/2022 CLINICAL DATA:  Right leg edema in thigh and calf EXAM: ULTRASOUND RIGHT LOWER EXTREMITY LIMITED TECHNIQUE: Ultrasound examination of the lower extremity soft tissues was performed in the area of clinical concern. COMPARISON:  None Available. FINDINGS: No focal mass or fluid collection.  Soft tissue edema noted. IMPRESSION: No mass or fluid collection. Electronically Signed   By: Rolm Baptise M.D.   On: 01/12/2022 19:20   US Venous Img Lower Bilateral (DVT)  Result Date: 01/12/2022 CLINICAL DATA:  Swelling EXAM: BILATERAL LOWER EXTREMITY VENOUS DOPPLER ULTRASOUND TECHNIQUE: Gray-scale sonography with compression, as well as color and duplex ultrasound, were performed to evaluate the deep venous system(s) from the level of the common femoral vein through the popliteal and proximal calf veins. COMPARISON:  None Available. FINDINGS: VENOUS Normal compressibility of the common femoral, superficial femoral, and popliteal veins, as well as the visualized calf veins. Visualized portions of profunda femoral vein and great saphenous vein unremarkable. No filling defects to suggest DVT on grayscale or color Doppler imaging. Doppler waveforms show normal direction of venous flow, normal respiratory plasticity and response to augmentation. Limited views of the contralateral common femoral vein are unremarkable. OTHER None. Limitations: Left distal femoral vein and calf veins not visualized due to edema. IMPRESSION: No evidence of lower extremity DVT with limitations in the left leg as above. Electronically Signed   By: Rolm Baptise M.D.   On: 01/12/2022 19:19   [place] LT LOWER EXTREM LTD SOFT TISSUE NON VASCULAR  Result Date: 01/12/2022 CLINICAL DATA:  Left leg swelling.  Evaluate thigh and calf EXAM: ULTRASOUND LEFT LOWER EXTREMITY LIMITED TECHNIQUE: Ultrasound examination of the lower extremity soft tissues was  performed in the area of clinical concern. COMPARISON:  None Available. FINDINGS: No focal mass or fluid collection in the areas of concern in the left thigh and calf. Soft tissue edema. IMPRESSION: No focal mass or fluid collection. Electronically Signed   By: Rolm Baptise M.D.   On: 01/12/2022 19:18   DG Chest Port 1 View  Result Date: 01/11/2022 CLINICAL DATA:  Fever EXAM: PORTABLE CHEST 1 VIEW COMPARISON:  Previous studies including the examination of 01/10/2022 FINDINGS: Transverse diameter of heart is increased. Central pulmonary vessels are more prominent. This may be partly due to poor inspiration in the current study. There is no focal pulmonary consolidation. There is no pleural effusion or pneumothorax. Tip of dialysis catheter is seen in the region of right atrium. IMPRESSION: Central pulmonary vessels are more prominent which may be due to poor inspiration or suggest CHF. No new focal pulmonary infiltrates are seen. There is no pleural effusion. Electronically Signed   By: Elmer Picker M.D.   On: 01/11/2022 17:59      Labs: BNP (last 3 results) No results for input(s): "BNP" in the last 8760 hours. Basic Metabolic Panel: Recent Labs  Lab 02/04/22 1742 02/06/22 1332 02/09/22 1210  NA 138 138 137  K 3.9 4.5 4.4  CL 96* 97* 97*  CO2 26 23 22   GLUCOSE 230* 159* 144*  BUN 25* 45* 59*  CREATININE 2.48* 4.23* 4.58*  CALCIUM 9.1 10.3 10.0  PHOS 1.7* 3.1 4.0   Liver Function Tests: Recent Labs  Lab  02/04/22 1742 02/06/22 1332 02/09/22 1210  ALBUMIN 2.3* 2.2* 2.0*   No results for input(s): "LIPASE", "AMYLASE" in the last 168 hours. No results for input(s): "AMMONIA" in the last 168 hours. CBC: Recent Labs  Lab 02/04/22 1742 02/06/22 1332 02/09/22 1210  WBC 11.5* 14.4* 13.9*  HGB 9.2* 9.0* 8.2*  HCT 29.5* 28.7* 26.3*  MCV 86.5 87.5 88.0  PLT 262 350 282   Cardiac Enzymes: No results for input(s): "CKTOTAL", "CKMB", "CKMBINDEX", "TROPONINI" in the last 168  hours. BNP: Invalid input(s): "POCBNP" CBG: Recent Labs  Lab 02/09/22 0740 02/09/22 1604 02/09/22 2039 02/10/22 0820 02/10/22 1231  GLUCAP 96 142* 119* 97 76   D-Dimer No results for input(s): "DDIMER" in the last 72 hours. Hgb A1c No results for input(s): "HGBA1C" in the last 72 hours. Lipid Profile No results for input(s): "CHOL", "HDL", "LDLCALC", "TRIG", "CHOLHDL", "LDLDIRECT" in the last 72 hours. Thyroid function studies No results for input(s): "TSH", "T4TOTAL", "T3FREE", "THYROIDAB" in the last 72 hours.  Invalid input(s): "FREET3" Anemia work up No results for input(s): "VITAMINB12", "FOLATE", "FERRITIN", "TIBC", "IRON", "RETICCTPCT" in the last 72 hours. Urinalysis    Component Value Date/Time   COLORURINE YELLOW (A) 01/02/2022 0038   APPEARANCEUR CLOUDY (A) 01/02/2022 0038   LABSPEC 1.013 01/02/2022 0038   PHURINE 5.0 01/02/2022 0038   GLUCOSEU NEGATIVE 01/02/2022 0038   HGBUR SMALL (A) 01/02/2022 0038   BILIRUBINUR NEGATIVE 01/02/2022 0038   KETONESUR NEGATIVE 01/02/2022 0038   PROTEINUR 100 (A) 01/02/2022 0038   NITRITE NEGATIVE 01/02/2022 0038   LEUKOCYTESUR NEGATIVE 01/02/2022 0038   Sepsis Labs Recent Labs  Lab 02/04/22 1742 02/06/22 1332 02/09/22 1210  WBC 11.5* 14.4* 13.9*   Microbiology No results found for this or any previous visit (from the past 240 hour(s)).   Total time spend on discharging this patient, including the last patient exam, discussing the hospital stay, instructions for ongoing care as it relates to all pertinent caregivers, as well as preparing the medical discharge records, prescriptions, and/or referrals as applicable, is 35 minutes.    Enzo Bi, MD  [facility] 02/10/2022, 1:16 PM

## 2022-02-10 NOTE — Care Management Important Message (Signed)
Important Message  Patient Details  Name: Theresa Warren MRN: [id number] Date of Birth: [birth year]   Medicare Important Message Given:  Yes     Juliann Pulse A Jeanetta Alonzo 02/10/2022, 1:48 PM

## 2022-02-10 NOTE — Progress Notes (Addendum)
PLACEMENT RESOLVED: [facility] TTS 11:30am. Start date: Thursday 7/13 at 11am. If patient can not transfer by herself and requires two people for transfers, then she will require a hoyer pad for transferring.

## 2022-02-10 NOTE — TOC Progression Note (Signed)
Transition of Care [facility]) - Progression Note    Patient Details  Name: Theresa Warren MRN: [id number] Date of Birth: January 26, 1956  [facility] CM/SW Contact  Beverly Sessions, RN Phone Number: 02/10/2022, 9:18 AM  Clinical Narrative:    Insurance auth obtained for SNF [id number] Valid 7/11-7/13  Outpatient HD pending    Expected Discharge Plan: [place] Barriers to Discharge: Continued Medical Work up  Expected Discharge Plan and Services Expected Discharge Plan: [place]   Discharge Planning Services: CM Consult Post Acute Care Choice:  (TBD) Living arrangements for the past 2 months: Single Family Home                                       Social Determinants of Health (SDOH) Interventions    Readmission Risk Interventions     No data to display

## 2022-02-10 NOTE — Progress Notes (Signed)
[facility]  PROGRESS NOTE   Subjective:   Patient seen sitting up in bed, alert Continues to complain of pain in hips and legs States she is ready for rehab but wonders if her mattress has been delivered.  Patient remains confused at times   Objective:  Vital signs: Blood pressure (!) 131/56, pulse 88, temperature 98.2 F (36.8 C), temperature source Oral, resp. rate 20, height 5\' 3"  (1.6 m), weight 100.9 kg, SpO2 95 %.  Intake/Output Summary (Last 24 hours) at 02/10/2022 1033 Last data filed at 02/09/2022 1513 Gross per 24 hour  Intake --  Output 1000 ml  Net -1000 ml     Filed Weights   02/08/22 0500 02/09/22 0500  Weight: 104 kg 100.9 kg     Physical Exam: General:  No acute distress  Head:  Normocephalic, atraumatic. Moist oral mucosal membranes  Eyes:  Anicteric  Lungs:   Clear to auscultation, normal effort  Heart:  S1S2 no rubs  Abdomen:   Soft, nontender, bowel sounds present  Extremities:  Trace peripheral edema.  Neurologic:  Awake, alert, following commands  Skin:  Sacral wounds  Access:  Right IJ PermCath    Basic Metabolic Panel: Recent Labs  Lab 02/04/22 1742 02/06/22 1332 02/09/22 1210  NA 138 138 137  K 3.9 4.5 4.4  CL 96* 97* 97*  CO2 26 23 22   GLUCOSE 230* 159* 144*  BUN 25* 45* 59*  CREATININE 2.48* 4.23* 4.58*  CALCIUM 9.1 10.3 10.0  PHOS 1.7* 3.1 4.0     CBC: Recent Labs  Lab 02/04/22 1742 02/06/22 1332 02/09/22 1210  WBC 11.5* 14.4* 13.9*  HGB 9.2* 9.0* 8.2*  HCT 29.5* 28.7* 26.3*  MCV 86.5 87.5 88.0  PLT 262 350 282      Urinalysis: No results for input(s): "COLORURINE", "LABSPEC", "PHURINE", "GLUCOSEU", "HGBUR", "BILIRUBINUR", "KETONESUR", "PROTEINUR", "UROBILINOGEN", "NITRITE", "LEUKOCYTESUR" in the last 72 hours.  Invalid input(s): "APPERANCEUR"    Imaging: No results found.   Medications:    sodium chloride Stopped (01/19/22 1845)   sodium thiosulfate 25 g in sodium chloride 0.9 % 200 mL  Infusion for Calciphylaxis 25 g (02/06/22 1741)    vitamin C  500 mg Oral BID   bisacodyl  10 mg Rectal Once   Chlorhexidine Gluconate Cloth  6 each Topical Q0600   epoetin (EPOGEN/PROCRIT) injection  10,000 Units Intravenous Q M,W,F-HD   fentaNYL  1 patch Transdermal Q72H   fluticasone  2 spray Each Nare Daily   gabapentin  200 mg Oral TID   insulin aspart  0-5 Units Subcutaneous QHS   insulin aspart  0-9 Units Subcutaneous TID WC   insulin aspart  3 Units Subcutaneous TID WC   insulin glargine-yfgn  15 Units Subcutaneous QHS   loratadine  10 mg Oral Daily   metoprolol succinate  25 mg Oral BID   multivitamin  1 tablet Oral QHS   nutrition supplement (JUVEN)  1 packet Oral BID BM   polyethylene glycol  17 g Oral Daily   Ensure Max Protein  11 oz Oral BID   senna-docusate  1 tablet Oral BID    Assessment/ Plan:     Principal Problem:   Severe sepsis (HCC) Active Problems:   OSA (obstructive sleep apnea)   Chronic kidney disease, stage 5 ([place])   Essential hypertension   DNR (do not resuscitate)/DNI(Do Not Intubate)   Type 2 diabetes mellitus with chronic kidney disease, with long-term current use of insulin (HCC)   Hypomagnesemia  Pressure injury of skin   Symptomatic anemia   Elevated LFTs   Sacral wound, initial encounter   Swelling of left lower extremity   Abdominal pain   AKI (acute kidney injury) ([place])   Hyponatremia   Premature atrial complexes   Anasarca associated with disorder of kidney   Calciphylaxis   ESRD (end stage renal disease) ([place])  66 year old female with history of hypertension, diabetes, peripheral vascular disease, hyperlipidemia, now with sepsis and acute kidney injury on the top of chronic kidney disease.  Patient is reached end-stage renal disease and was started on renal replacement therapy.   2D echo from January 03, 2022-:LVEF 60 to 65%, no regional wall motion abnormalities, asymmetric left ventricular hypertrophy of the basal septal segment.   Diastolic parameters normal.   #: End-stage renal disease on dialysis: Dialysis received yesterday, UF 1L achieved. Next treatment scheduled for Wednesday.    #: Sepsis/fever likely from sacral wound. Biopsy negative for calciphylaxis. Prescribed Meropenem, with plans to hold. Negative pressure wound VAC remains in place and managed by surgery team.  Continue sodium thiosulfate with dialysis treatments.   #: Anemia secondary to chronic kidney disease complicated by sepsis. Has received blood transfusions during this admission.  Lab Results  Component Value Date   HGB 8.2 (L) 02/09/2022  Hemoglobin below target.  Continue EPO with dialysis treatments.   #: Secondary hyperparathyroidism:  Calcium at goal at this time.  Continue nutritional supplements.  # Diabetes with CKD Lab Results  Component Value Date   HGBA1C 10.0 (H) 12/18/2021   Primary team to manage SSI    LOS: [facility] 7/11/202310:33 AM

## 2022-02-10 NOTE — Plan of Care (Signed)

## 2022-02-10 NOTE — Plan of Care (Signed)
Problem: Fluid Volume: Goal: Hemodynamic stability will improve 02/10/2022 1837 by Ardeen Jourdain, RN Outcome: Adequate for Discharge 02/10/2022 1410 by Roshawn Lacina P, RN Outcome: Progressing   Problem: Clinical Measurements: Goal: Diagnostic test results will improve 02/10/2022 1837 by Ardeen Jourdain, RN Outcome: Adequate for Discharge 02/10/2022 1410 by Riva Sesma P, RN Outcome: Progressing Goal: Signs and symptoms of infection will decrease 02/10/2022 1837 by Ardeen Jourdain, RN Outcome: Adequate for Discharge 02/10/2022 1410 by Armie Moren P, RN Outcome: Progressing   Problem: Respiratory: Goal: Ability to maintain adequate ventilation will improve 02/10/2022 1837 by Ardeen Jourdain, RN Outcome: Adequate for Discharge 02/10/2022 1410 by Sebastien Jackson P, RN Outcome: Progressing   Problem: Education: Goal: Ability to describe self-care measures that may prevent or decrease complications (Diabetes Survival Skills Education) will improve 02/10/2022 1837 by Ardeen Jourdain, RN Outcome: Adequate for Discharge 02/10/2022 1410 by Ardeen Jourdain, RN Outcome: Progressing Goal: Individualized Educational Video(s) 02/10/2022 1837 by Ardeen Jourdain, RN Outcome: Adequate for Discharge 02/10/2022 1410 by Ardeen Jourdain, RN Outcome: Progressing   Problem: Coping: Goal: Ability to adjust to condition or change in health will improve 02/10/2022 1837 by Ardeen Jourdain, RN Outcome: Adequate for Discharge 02/10/2022 1410 by Madyx Delfin P, RN Outcome: Progressing   Problem: Fluid Volume: Goal: Ability to maintain a balanced intake and output will improve 02/10/2022 1837 by Ardeen Jourdain, RN Outcome: Adequate for Discharge 02/10/2022 1410 by Yassmine Tamm P, RN Outcome: Progressing   Problem: Health Behavior/Discharge Planning: Goal: Ability to identify and utilize available resources and services will improve 02/10/2022 1837 by  Ardeen Jourdain, RN Outcome: Adequate for Discharge 02/10/2022 1410 by Nene Aranas P, RN Outcome: Progressing Goal: Ability to manage health-related needs will improve 02/10/2022 1837 by Ardeen Jourdain, RN Outcome: Adequate for Discharge 02/10/2022 1410 by Bryce Kimble P, RN Outcome: Progressing   Problem: Metabolic: Goal: Ability to maintain appropriate glucose levels will improve 02/10/2022 1837 by Ardeen Jourdain, RN Outcome: Adequate for Discharge 02/10/2022 1410 by Ling Flesch P, RN Outcome: Progressing   Problem: Nutritional: Goal: Maintenance of adequate nutrition will improve 02/10/2022 1837 by Ardeen Jourdain, RN Outcome: Adequate for Discharge 02/10/2022 1410 by Adamarie Izzo P, RN Outcome: Progressing Goal: Progress toward achieving an optimal weight will improve 02/10/2022 1837 by Ardeen Jourdain, RN Outcome: Adequate for Discharge 02/10/2022 1410 by Stepan Verrette P, RN Outcome: Progressing   Problem: Skin Integrity: Goal: Risk for impaired skin integrity will decrease 02/10/2022 1837 by Ardeen Jourdain, RN Outcome: Adequate for Discharge 02/10/2022 1410 by Emmary Culbreath P, RN Outcome: Progressing   Problem: Tissue Perfusion: Goal: Adequacy of tissue perfusion will improve 02/10/2022 1837 by Ardeen Jourdain, RN Outcome: Adequate for Discharge 02/10/2022 1410 by Sheronica Corey P, RN Outcome: Progressing   Problem: Education: Goal: Knowledge of General Education information will improve Description: Including pain rating scale, medication(s)/side effects and non-pharmacologic comfort measures 02/10/2022 1837 by Ardeen Jourdain, RN Outcome: Adequate for Discharge 02/10/2022 1410 by Rannie Craney P, RN Outcome: Progressing   Problem: Health Behavior/Discharge Planning: Goal: Ability to manage health-related needs will improve 02/10/2022 1837 by Ardeen Jourdain, RN Outcome: Adequate for Discharge 02/10/2022 1410 by  Lunette Tapp P, RN Outcome: Progressing   Problem: Clinical Measurements: Goal: Ability to maintain clinical measurements within normal limits will improve 02/10/2022 1837 by Aubery Douthat P, RN Outcome: Adequate for Discharge 02/10/2022 1410 by Avary Eichenberger P, RN Outcome: Progressing Goal: Will remain free  from infection 02/10/2022 1837 by Ardeen Jourdain, RN Outcome: Adequate for Discharge 02/10/2022 1410 by Ardeen Jourdain, RN Outcome: Progressing Goal: Diagnostic test results will improve 02/10/2022 1837 by Ardeen Jourdain, RN Outcome: Adequate for Discharge 02/10/2022 1410 by Clarissia Mckeen P, RN Outcome: Progressing Goal: Respiratory complications will improve 02/10/2022 1837 by Ardeen Jourdain, RN Outcome: Adequate for Discharge 02/10/2022 1410 by Stefhanie Kachmar P, RN Outcome: Progressing Goal: Cardiovascular complication will be avoided 02/10/2022 1837 by Ardeen Jourdain, RN Outcome: Adequate for Discharge 02/10/2022 1410 by Aarian Cleaver P, RN Outcome: Progressing   Problem: Activity: Goal: Risk for activity intolerance will decrease 02/10/2022 1837 by Ardeen Jourdain, RN Outcome: Adequate for Discharge 02/10/2022 1410 by Vyron Fronczak P, RN Outcome: Progressing   Problem: Nutrition: Goal: Adequate nutrition will be maintained 02/10/2022 1837 by Ardeen Jourdain, RN Outcome: Adequate for Discharge 02/10/2022 1410 by Maeva Dant P, RN Outcome: Progressing   Problem: Coping: Goal: Level of anxiety will decrease 02/10/2022 1837 by Ardeen Jourdain, RN Outcome: Adequate for Discharge 02/10/2022 1410 by Yuvia Plant P, RN Outcome: Progressing   Problem: Elimination: Goal: Will not experience complications related to bowel motility 02/10/2022 1837 by Ardeen Jourdain, RN Outcome: Adequate for Discharge 02/10/2022 1410 by Aiden Rao P, RN Outcome: Progressing Goal: Will not experience complications related to urinary  retention 02/10/2022 1837 by Ardeen Jourdain, RN Outcome: Adequate for Discharge 02/10/2022 1410 by Rebecka Oelkers P, RN Outcome: Progressing   Problem: Pain Managment: Goal: General experience of comfort will improve 02/10/2022 1837 by Ardeen Jourdain, RN Outcome: Adequate for Discharge 02/10/2022 1410 by Aziel Morgan P, RN Outcome: Progressing   Problem: Safety: Goal: Ability to remain free from injury will improve 02/10/2022 1837 by Burkley Dech P, RN Outcome: Adequate for Discharge 02/10/2022 1410 by Marlies Ligman P, RN Outcome: Progressing   Problem: Skin Integrity: Goal: Risk for impaired skin integrity will decrease 02/10/2022 1837 by Ardeen Jourdain, RN Outcome: Adequate for Discharge 02/10/2022 1410 by Herbie Lehrmann P, RN Outcome: Progressing

## 2022-02-18 NOTE — Progress Notes (Signed)
Family friend [name], that we have Theresa Warren's cell phone and charger. She is on the way to pick it up. 02/18/22 @ 1354

## 2022-02-22 ENCOUNTER — Emergency Department: Payer: Medicare Other

## 2022-02-22 ENCOUNTER — Inpatient Hospital Stay
Admission: EM | Admit: 2022-02-22 | Discharge: 2022-03-02 | DRG: 853 | Disposition: A | Discharge status: Skilled Nursing Facility | Payer: Medicare Other | Source: Skilled Nursing Facility | Attending: Internal Medicine | Admitting: Internal Medicine

## 2022-02-22 ENCOUNTER — Other Ambulatory Visit: Payer: Self-pay

## 2022-02-22 DIAGNOSIS — R600 Localized edema: Secondary | ICD-10-CM | POA: Diagnosis not present

## 2022-02-22 DIAGNOSIS — S31829D Unspecified open wound of left buttock, subsequent encounter: Secondary | ICD-10-CM

## 2022-02-22 DIAGNOSIS — Z794 Long term (current) use of insulin: Secondary | ICD-10-CM | POA: Diagnosis not present

## 2022-02-22 DIAGNOSIS — R6520 Severe sepsis without septic shock: Secondary | ICD-10-CM | POA: Diagnosis not present

## 2022-02-22 DIAGNOSIS — R197 Diarrhea, unspecified: Secondary | ICD-10-CM | POA: Diagnosis present

## 2022-02-22 DIAGNOSIS — W06XXXA Fall from bed, initial encounter: Secondary | ICD-10-CM | POA: Diagnosis present

## 2022-02-22 DIAGNOSIS — G894 Chronic pain syndrome: Secondary | ICD-10-CM | POA: Diagnosis not present

## 2022-02-22 DIAGNOSIS — L97902 Non-pressure chronic ulcer of unspecified part of unspecified lower leg with fat layer exposed: Secondary | ICD-10-CM | POA: Diagnosis not present

## 2022-02-22 DIAGNOSIS — L89220 Pressure ulcer of left hip, unstageable: Secondary | ICD-10-CM | POA: Diagnosis present

## 2022-02-22 DIAGNOSIS — Z66 Do not resuscitate: Secondary | ICD-10-CM | POA: Diagnosis present

## 2022-02-22 DIAGNOSIS — D649 Anemia, unspecified: Secondary | ICD-10-CM | POA: Diagnosis not present

## 2022-02-22 DIAGNOSIS — Z881 Allergy status to other antibiotic agents status: Secondary | ICD-10-CM

## 2022-02-22 DIAGNOSIS — N2581 Secondary hyperparathyroidism of renal origin: Secondary | ICD-10-CM | POA: Diagnosis present

## 2022-02-22 DIAGNOSIS — E8770 Fluid overload, unspecified: Secondary | ICD-10-CM | POA: Diagnosis not present

## 2022-02-22 DIAGNOSIS — E1142 Type 2 diabetes mellitus with diabetic polyneuropathy: Secondary | ICD-10-CM | POA: Diagnosis present

## 2022-02-22 DIAGNOSIS — L89323 Pressure ulcer of left buttock, stage 3: Secondary | ICD-10-CM | POA: Diagnosis present

## 2022-02-22 DIAGNOSIS — E119 Type 2 diabetes mellitus without complications: Secondary | ICD-10-CM | POA: Diagnosis not present

## 2022-02-22 DIAGNOSIS — Z79899 Other long term (current) drug therapy: Secondary | ICD-10-CM | POA: Diagnosis not present

## 2022-02-22 DIAGNOSIS — I120 Hypertensive chronic kidney disease with stage 5 chronic kidney disease or end stage renal disease: Secondary | ICD-10-CM | POA: Diagnosis present

## 2022-02-22 DIAGNOSIS — Z885 Allergy status to narcotic agent status: Secondary | ICD-10-CM

## 2022-02-22 DIAGNOSIS — Z820 Family history of epilepsy and other diseases of the nervous system: Secondary | ICD-10-CM

## 2022-02-22 DIAGNOSIS — N950 Postmenopausal bleeding: Secondary | ICD-10-CM | POA: Diagnosis not present

## 2022-02-22 DIAGNOSIS — Z992 Dependence on renal dialysis: Secondary | ICD-10-CM | POA: Diagnosis not present

## 2022-02-22 DIAGNOSIS — E785 Hyperlipidemia, unspecified: Secondary | ICD-10-CM | POA: Diagnosis present

## 2022-02-22 DIAGNOSIS — Z6841 Body Mass Index (BMI) 40.0 and over, adult: Secondary | ICD-10-CM

## 2022-02-22 DIAGNOSIS — S31809A Unspecified open wound of unspecified buttock, initial encounter: Secondary | ICD-10-CM

## 2022-02-22 DIAGNOSIS — Z880 Allergy status to penicillin: Secondary | ICD-10-CM

## 2022-02-22 DIAGNOSIS — A419 Sepsis, unspecified organism: Principal | ICD-10-CM | POA: Diagnosis present

## 2022-02-22 DIAGNOSIS — I10 Essential (primary) hypertension: Secondary | ICD-10-CM

## 2022-02-22 DIAGNOSIS — E1122 Type 2 diabetes mellitus with diabetic chronic kidney disease: Secondary | ICD-10-CM | POA: Diagnosis present

## 2022-02-22 DIAGNOSIS — N189 Chronic kidney disease, unspecified: Secondary | ICD-10-CM | POA: Diagnosis present

## 2022-02-22 DIAGNOSIS — D631 Anemia in chronic kidney disease: Secondary | ICD-10-CM | POA: Diagnosis present

## 2022-02-22 DIAGNOSIS — Z888 Allergy status to other drugs, medicaments and biological substances status: Secondary | ICD-10-CM

## 2022-02-22 DIAGNOSIS — Z7189 Other specified counseling: Secondary | ICD-10-CM | POA: Diagnosis not present

## 2022-02-22 DIAGNOSIS — N186 End stage renal disease: Secondary | ICD-10-CM | POA: Diagnosis present

## 2022-02-22 DIAGNOSIS — S31829A Unspecified open wound of left buttock, initial encounter: Secondary | ICD-10-CM | POA: Diagnosis not present

## 2022-02-22 DIAGNOSIS — L89210 Pressure ulcer of right hip, unstageable: Secondary | ICD-10-CM | POA: Diagnosis present

## 2022-02-22 DIAGNOSIS — N049 Nephrotic syndrome with unspecified morphologic changes: Secondary | ICD-10-CM

## 2022-02-22 DIAGNOSIS — S31819A Unspecified open wound of right buttock, initial encounter: Secondary | ICD-10-CM | POA: Diagnosis not present

## 2022-02-22 DIAGNOSIS — L89313 Pressure ulcer of right buttock, stage 3: Secondary | ICD-10-CM | POA: Diagnosis present

## 2022-02-22 DIAGNOSIS — E1165 Type 2 diabetes mellitus with hyperglycemia: Secondary | ICD-10-CM | POA: Diagnosis present

## 2022-02-22 DIAGNOSIS — Z882 Allergy status to sulfonamides status: Secondary | ICD-10-CM

## 2022-02-22 DIAGNOSIS — Z8249 Family history of ischemic heart disease and other diseases of the circulatory system: Secondary | ICD-10-CM

## 2022-02-22 LAB — BASIC METABOLIC PANEL
Anion gap: 15 (ref 5–15)
BUN: 23 mg/dL (ref 8–23)
CO2: 23 mmol/L (ref 22–32)
Calcium: 9 mg/dL (ref 8.9–10.3)
Chloride: 100 mmol/L (ref 98–111)
Creatinine, Ser: 2.97 mg/dL — ABNORMAL HIGH (ref 0.44–1.00)
GFR, Estimated: 17 mL/min — ABNORMAL LOW (ref >60)
Glucose, Bld: 242 mg/dL — ABNORMAL HIGH (ref 70–99)
Potassium: 4.3 mmol/L (ref 3.5–5.1)
Sodium: 138 mmol/L (ref 135–145)

## 2022-02-22 LAB — HEPATIC FUNCTION PANEL
ALT: 20 U/L (ref 0–44)
AST: 23 U/L (ref 15–41)
Albumin: 2.4 g/dL — ABNORMAL LOW (ref 3.5–5.0)
Alkaline Phosphatase: 190 U/L — ABNORMAL HIGH (ref 38–126)
Bilirubin, Direct: 0.2 mg/dL (ref 0.0–0.2)
Indirect Bilirubin: 0.9 mg/dL (ref 0.3–0.9)
Total Bilirubin: 1.1 mg/dL (ref 0.3–1.2)
Total Protein: 6.7 g/dL (ref 6.5–8.1)

## 2022-02-22 LAB — LACTIC ACID, PLASMA: Lactic Acid, Venous: 1.5 mmol/L (ref 0.5–1.9)

## 2022-02-22 LAB — CBC
HCT: 26.6 % — ABNORMAL LOW (ref 36.0–46.0)
Hemoglobin: 7.9 g/dL — ABNORMAL LOW (ref 12.0–15.0)
MCH: 27.4 pg (ref 26.0–34.0)
MCHC: 29.7 g/dL — ABNORMAL LOW (ref 30.0–36.0)
MCV: 92.4 fL (ref 80.0–100.0)
Platelets: 447 10*3/uL — ABNORMAL HIGH (ref 150–400)
RBC: 2.88 MIL/uL — ABNORMAL LOW (ref 3.87–5.11)
RDW: 18.9 % — ABNORMAL HIGH (ref 11.5–15.5)
WBC: 21.5 10*3/uL — ABNORMAL HIGH (ref 4.0–10.5)
nRBC: 0 % (ref 0.0–0.2)

## 2022-02-22 LAB — CK: Total CK: 30 U/L — ABNORMAL LOW (ref 38–234)

## 2022-02-22 LAB — CBG MONITORING, ED
Glucose-Capillary: 206 mg/dL — ABNORMAL HIGH (ref 70–99)
Glucose-Capillary: 156 mg/dL — ABNORMAL HIGH (ref 70–99)

## 2022-02-22 MED ORDER — VANCOMYCIN HCL IN DEXTROSE 1-5 GM/200ML-% IV SOLN: 1000.0000 mg | Freq: Once | INTRAVENOUS | Status: DC

## 2022-02-22 MED ORDER — METRONIDAZOLE 500 MG/100ML IV SOLN
500.0000 mg | Freq: Two times a day (BID) | INTRAVENOUS | Status: DC
Start: 2022-02-23 — End: 2022-02-26
  Administered 2022-02-23 – 2022-02-26 (×7): 500 mg via INTRAVENOUS
  Filled 2022-02-22 (×8): qty 100

## 2022-02-22 MED ORDER — ENOXAPARIN SODIUM 60 MG/0.6ML IJ SOSY
0.5000 mg/kg | PREFILLED_SYRINGE | INTRAMUSCULAR | Status: DC
Start: 2022-02-22 — End: 2022-02-22

## 2022-02-22 MED ORDER — ATORVASTATIN CALCIUM 20 MG PO TABS
40.0000 mg | ORAL_TABLET | Freq: Every day | ORAL | Status: DC
  Administered 2022-02-22 – 2022-03-01 (×8): 40 mg via ORAL
  Filled 2022-02-22 (×8): qty 2

## 2022-02-22 MED ORDER — HEPARIN SODIUM (PORCINE) 5000 UNIT/ML IJ SOLN
5000.0000 [IU] | Freq: Three times a day (TID) | INTRAMUSCULAR | Status: DC
  Administered 2022-02-22 – 2022-03-02 (×24): 5000 [IU] via SUBCUTANEOUS
  Filled 2022-02-22 (×26): qty 1

## 2022-02-22 MED ORDER — POLYETHYLENE GLYCOL 3350 17 G PO PACK
17.0000 g | PACK | Freq: Every day | ORAL | Status: DC | PRN
Start: 2022-02-22 — End: 2022-03-02

## 2022-02-22 MED ORDER — OXYCODONE HCL 5 MG PO TABS
5.0000 mg | ORAL_TABLET | ORAL | Status: DC | PRN
  Administered 2022-02-22 – 2022-02-26 (×10): 5 mg via ORAL
  Filled 2022-02-22 (×11): qty 1

## 2022-02-22 MED ORDER — INSULIN ASPART 100 UNIT/ML IJ SOLN
0.0000 [IU] | INTRAMUSCULAR | Status: DC
  Administered 2022-02-22: 1 [IU] via SUBCUTANEOUS
  Administered 2022-02-22: 2 [IU] via SUBCUTANEOUS
  Filled 2022-02-22 (×2): qty 1

## 2022-02-22 MED ORDER — SODIUM CHLORIDE 0.9 % IV SOLN
2.0000 g | INTRAVENOUS | Status: DC
  Administered 2022-02-23 – 2022-02-26 (×4): 2 g via INTRAVENOUS
  Filled 2022-02-22 (×2): qty 2
  Filled 2022-02-22: qty 20
  Filled 2022-02-22: qty 2

## 2022-02-22 MED ORDER — METRONIDAZOLE 500 MG/100ML IV SOLN
500.0000 mg | Freq: Once | INTRAVENOUS | Status: AC
  Administered 2022-02-22: 500 mg via INTRAVENOUS
  Filled 2022-02-22: qty 100

## 2022-02-22 MED ORDER — SODIUM CHLORIDE 0.9 % IV SOLN
2.0000 g | Freq: Once | INTRAVENOUS | Status: AC
  Administered 2022-02-22: 2 g via INTRAVENOUS
  Filled 2022-02-22: qty 20

## 2022-02-22 MED ORDER — GABAPENTIN 100 MG PO CAPS
200.0000 mg | ORAL_CAPSULE | Freq: Three times a day (TID) | ORAL | Status: DC
  Administered 2022-02-22 – 2022-03-02 (×24): 200 mg via ORAL
  Filled 2022-02-22 (×23): qty 2

## 2022-02-22 MED ORDER — SENNOSIDES-DOCUSATE SODIUM 8.6-50 MG PO TABS
1.0000 | ORAL_TABLET | Freq: Every day | ORAL | Status: DC
  Administered 2022-02-22 – 2022-02-24 (×3): 1 via ORAL
  Filled 2022-02-22 (×3): qty 1

## 2022-02-22 MED ORDER — FENTANYL CITRATE PF 50 MCG/ML IJ SOSY
50.0000 ug | PREFILLED_SYRINGE | Freq: Once | INTRAMUSCULAR | Status: AC
  Administered 2022-02-22: 50 ug via INTRAVENOUS
  Filled 2022-02-22: qty 1

## 2022-02-22 MED ORDER — ACETAMINOPHEN 325 MG PO TABS
650.0000 mg | ORAL_TABLET | Freq: Four times a day (QID) | ORAL | Status: DC | PRN
  Administered 2022-02-28: 650 mg via ORAL
  Filled 2022-02-22: qty 2

## 2022-02-22 MED ORDER — HYDROMORPHONE HCL 1 MG/ML IJ SOLN
0.5000 mg | INTRAMUSCULAR | Status: DC | PRN
  Administered 2022-02-23 (×2): 0.5 mg via INTRAVENOUS
  Filled 2022-02-22 (×2): qty 1

## 2022-02-22 MED ORDER — VANCOMYCIN HCL 2000 MG/400ML IV SOLN
2000.0000 mg | Freq: Once | INTRAVENOUS | Status: AC
  Administered 2022-02-22: 2000 mg via INTRAVENOUS
  Filled 2022-02-22: qty 400

## 2022-02-22 MED ORDER — MIDODRINE HCL 5 MG PO TABS
10.0000 mg | ORAL_TABLET | Freq: Two times a day (BID) | ORAL | Status: DC
  Administered 2022-02-22 – 2022-02-25 (×7): 10 mg via ORAL
  Filled 2022-02-22 (×6): qty 2

## 2022-02-22 MED ORDER — VANCOMYCIN HCL IN DEXTROSE 1-5 GM/200ML-% IV SOLN: 1000.0000 mg | INTRAVENOUS | Status: DC

## 2022-02-22 MED ORDER — ONDANSETRON HCL 4 MG/2ML IJ SOLN
4.0000 mg | Freq: Once | INTRAMUSCULAR | Status: AC
  Administered 2022-02-22: 4 mg via INTRAVENOUS
  Filled 2022-02-22: qty 2

## 2022-02-22 MED ORDER — VANCOMYCIN VARIABLE DOSE PER UNSTABLE RENAL FUNCTION (PHARMACIST DOSING): Status: DC

## 2022-02-22 NOTE — Sepsis Progress Note (Signed)
Elink following code sepsis °

## 2022-02-22 NOTE — Progress Notes (Signed)
PHARMACY -  BRIEF ANTIBIOTIC NOTE   Pharmacy has received consult(s) for Vancomycin from an ED provider.  The patient's profile has been reviewed for ht/wt/allergies/indication/available labs.    One time order(s) placed for Vancomycin 2 grams IV x1  Further antibiotics/pharmacy consults should be ordered by admitting physician if indicated.                       Thank you, Gretel Acre 02/22/2022  10:32 AM

## 2022-02-22 NOTE — Progress Notes (Signed)
History and Physical  Nayab Aten BJY:[id number] DOB: 1955-08-07 DOA: 02/22/2022  Referring physician: Dr. Jari Pigg, [place]  PCP: Crist Infante, MD  Outpatient Specialists: General surgery Patient coming from: SNF  Chief Complaint: Fall   HPI: Theresa Warren is a 66 y.o. 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    The patient presents to [facility] ED from SNF via EMS due to a fall, she slid off her bed.  Imaging in the ED is reassuring, no fractures.  Noted to be tachycardic with leukocytosis greater than 21000 with concern for sepsis secondary to necrotic sacral decubitus wounds.  The patient was started on empiric broad-spectrum IV antibiotics, IV vancomycin, Rocephin and IV Flagyl.  EDP discussed the case with general surgery.  The patient may require I&D.  Made n.p.o. until seen by general surgery.  The patient was admitted by [facility], hospitalist service.  ED Course: Tmax 98.0.  BP 121/54.  Pulse 108, respiration rate 21, O2 saturation 100% on room air.  Lab studies remarkable for serum glucose 242, alkaline phosphatase 190.  WBC 21.5.  Hemoglobin 7.9.  Platelet count 447.  Lactic acid 1.5.  Review of Systems: Review of systems as noted in the HPI. All other systems reviewed and are negative.   Past Medical History:  Diagnosis Date   CKD (chronic kidney disease), stage III ([place])    Diabetes ([place])    Hyperlipidemia    Hypertension    Obesity    Past Surgical History:  Procedure Laterality Date   ACHILLES TENDON REPAIR     ANKLE SURGERY     DIALYSIS/PERMA CATHETER INSERTION N/A 01/09/2022   Procedure: DIALYSIS/PERMA CATHETER INSERTION;   Surgeon: Katha Cabal, MD;  Location: [place] CV LAB;  Service: Cardiovascular;  Laterality: N/A;   INCISION AND DRAINAGE ABSCESS Bilateral 01/01/2022   Procedure: INCISION AND DRAINAGE ABSCESS-Sacral Decubitus;  Surgeon: Olean Ree, MD;  Location: [facility] ORS;  Service: General;  Laterality: Bilateral;   URETHRAL STRICTURE DILATATION      Social History:  reports that she has never smoked. She does not have any smokeless tobacco history on file. She reports that she does not currently use alcohol. She reports that she does not currently use drugs.   Allergies  Allergen Reactions   Codeine Nausea And Vomiting   Sulfa Antibiotics Swelling   Clindamycin/Lincomycin Rash   Dilaudid [Hydromorphone Hcl] Rash   Penicillins Rash    Family History  Problem Relation Age of Onset   Alzheimer's disease Father    Diabetes Father    CAD Father 73   CAD Other        Multliple maternal aunts and uncles with early onset heart disease   Lung cancer Sister    ALS Mother       Prior to Admission medications   Medication Sig Start Date End Date Taking? Authorizing Provider  allopurinol (ZYLOPRIM) 100 MG tablet Take 100 mg by mouth daily.    [provider]  ascorbic acid (VITAMIN C) 500 MG tablet Take 1 tablet (500 mg total) by mouth 2 (two) times daily. 02/10/22   Enzo Bi, MD  atorvastatin (LIPITOR) 40 MG tablet Take 40 mg by mouth daily.    [provider]  Ensure Max Protein (ENSURE MAX PROTEIN) LIQD Take 330 mLs (11 oz total) by mouth 2 (two) times daily. 02/10/22   Enzo Bi, MD  epoetin alfa (EPOGEN) 10000 UNIT/ML injection Inject 1 mL (10,000 Units total) into the vein every Monday, Wednesday, and Friday with hemodialysis. 02/11/22   Enzo Bi, MD  fexofenadine (ALLEGRA) 180 MG tablet Take 180 mg by mouth daily.    [provider]  fluticasone (FLONASE) 50 MCG/ACT nasal spray Place 2 sprays into both nostrils daily. 11/27/14 12/19/21  Vilinda Boehringer, MD   gabapentin (NEURONTIN) 100 MG capsule Take 2 capsules (200 mg total) by mouth 3 (three) times daily. 02/10/22   Enzo Bi, MD  insulin aspart (NOVOLOG) 100 UNIT/ML injection Inject 3 Units into the skin 3 (three) times daily with meals. 02/10/22   Enzo Bi, MD  insulin glargine-yfgn (SEMGLEE) 100 UNIT/ML injection Inject 0.15 mLs (15 Units total) into the skin at bedtime. 02/10/22   Enzo Bi, MD  metoprolol tartrate (LOPRESSOR) 25 MG tablet Take 1 tablet (25 mg total) by mouth 2 (two) times daily. 12/23/21   Sharen Hones, MD  multivitamin (RENA-VIT) TABS tablet Take 1 tablet by mouth at bedtime. 02/10/22   Enzo Bi, MD  nutrition supplement, JUVEN, (JUVEN) PACK Take 1 packet by mouth 2 (two) times daily between meals. 02/10/22   Enzo Bi, MD  nystatin (MYCOSTATIN/NYSTOP) powder Apply topically. 10/28/21   [provider]  Omega-3 Fatty Acids (FISH OIL) 500 MG CAPS Take by mouth.    [provider]  oxyCODONE (OXY IR/ROXICODONE) 5 MG immediate release tablet Take 1 tablet (5 mg total) by mouth every 6 (six) hours as needed for moderate pain or severe pain (can give 10 mg 30 minutes before dressing change). 02/10/22   Enzo Bi, MD  polyethylene glycol (MIRALAX / GLYCOLAX) 17 g packet Take 17 g by mouth daily. 02/11/22   Enzo Bi, MD  senna-docusate (SENOKOT-S) 8.6-50 MG tablet Take 1 tablet by mouth 2 (two) times daily. 02/10/22   Enzo Bi, MD  sodium chloride 0.9 % SOLN 100 mL with sodium thiosulfate 250 MG/ML SOLN 25 g Inject 25 g into the vein every Monday, Wednesday, and Friday with hemodialysis. 02/10/22   Enzo Bi, MD    Physical Exam: BP (!) 122/56   Pulse 89   Temp 98 F (36.7 C)   Resp 18   Ht 5\' 2"  (1.575 m)   Wt 113.4 kg   SpO2 99%   BMI 45.73 kg/m   General: 66 y.o. year-old female severely morbidly obese in no acute distress.  Alert and oriented x3. Cardiovascular: Regular rate and rhythm with no rubs or gallops.  No thyromegaly or JVD noted.  2+ pitting edema  in lower extremities bilaterally. Respiratory: Clear to auscultation with no wheezes or rales. Good inspiratory effort. Abdomen: Soft nontender nondistended with normal bowel sounds x4 quadrants. Muskuloskeletal: No cyanosis or clubbing.  2+ pitting edema in lower extremities bilaterally.   Neuro: CN II-XII intact, strength, sensation, reflexes Skin: Diffuse pressure wounds, POA Psychiatry: Judgement and insight appear normal. Mood is appropriate for condition and setting          Labs on Admission:  Basic Metabolic Panel: Recent Labs  Lab 02/22/22 0930  NA 138  K 4.3  CL 100  CO2 23  GLUCOSE 242*  BUN 23  CREATININE 2.97*  CALCIUM 9.0   Liver Function Tests: Recent Labs  Lab 02/22/22 0930  AST 23  ALT 20  ALKPHOS 190*  BILITOT 1.1  PROT 6.7  ALBUMIN 2.4*   No results for input(s): "LIPASE", "AMYLASE" in the last 168 hours. No results for input(s): "AMMONIA" in the last 168 hours. CBC: Recent Labs  Lab 02/22/22 0930  WBC 21.5*  HGB 7.9*  HCT 26.6*  MCV 92.4  PLT 447*   Cardiac Enzymes: Recent Labs  Lab 02/22/22 0930  CKTOTAL 30*    BNP (last 3 results) No results for input(s): "BNP" in the last 8760 hours.  ProBNP (last 3 results) No results for input(s): "PROBNP" in the last 8760 hours.  CBG: No results for input(s): "GLUCAP" in the last 168 hours.  Radiological Exams on Admission: CT ABDOMEN PELVIS WO CONTRAST  Result Date: 02/22/2022 CLINICAL DATA:  Patient fell out of bed.  Found down.  Bedsores. EXAM: CT ABDOMEN AND PELVIS WITHOUT CONTRAST TECHNIQUE: Multidetector CT imaging of the abdomen and pelvis was performed following the standard protocol without IV contrast. RADIATION DOSE REDUCTION: This exam was performed according to the departmental dose-optimization program which includes automated exposure control, adjustment of the mA and/or kV according to patient size and/or use of iterative reconstruction technique. COMPARISON:  01/01/2022  FINDINGS: Lower chest: Nonacute posterolateral right rib fractures evident. Hepatobiliary: Nodular liver contour is compatible with cirrhosis. No suspicious focal abnormality in the liver on this study without intravenous contrast. There is no evidence for gallstones, gallbladder wall thickening, or pericholecystic fluid. No intrahepatic or extrahepatic biliary dilation. Pancreas: No focal mass lesion. No dilatation of the main duct. No intraparenchymal cyst. No peripancreatic edema. Spleen: 16 mm low-density lesion in the medial spleen is stable, likely benign. Adrenals/Urinary Tract: No adrenal nodule or mass. Tiny bilateral renal lesions of varying attenuation are stable, likely a combination of simple and complex cyst. No evidence for hydroureter. Bladder is distended. Stomach/Bowel: Stomach is unremarkable. No gastric wall thickening. No evidence of outlet obstruction. Duodenum is normally positioned as is the ligament of Treitz. No small bowel wall thickening. No small bowel dilatation. The terminal ileum is normal. The appendix is not well visualized, but there is no edema or inflammation in the region of the cecum. No gross colonic mass. No colonic wall thickening. Vascular/Lymphatic: There is mild atherosclerotic calcification of the abdominal aorta without aneurysm. There is no gastrohepatic or hepatoduodenal ligament lymphadenopathy. No retroperitoneal or mesenteric lymphadenopathy. No pelvic sidewall lymphadenopathy. Reproductive: Endometrial canal measures 13 mm thickness in the lower uterine segment, abnormal in a postmenopausal female. There is no adnexal mass. Other: No intraperitoneal free fluid. Musculoskeletal: No worrisome lytic or sclerotic osseous abnormality. No evidence for lumbar spine fracture Bilateral pars interarticularis defects noted at L5. Sacral decubitus ulcer is evident. IMPRESSION: 1. No acute findings in the abdomen or pelvis. 2. Nodular liver contour compatible with cirrhosis.  3. Endometrial canal measures 13 mm thickness in the lower uterine segment, abnormal in a postmenopausal female. Pelvic ultrasound recommended to further evaluate. 4. 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 No acute bony abnormality identified in the lumbar spine. Please see report for lumbar spine CT performed the same time and dictated separately. 5. Bilateral sacral decubitus ulcers. 6.  Aortic Atherosclerois (ICD10-170.0) Electronically Signed   By: Misty Stanley M.D.   On: 02/22/2022 13:50   CT L-SPINE NO CHARGE  Result Date: 02/22/2022 CLINICAL DATA:  Fall out of bed. Dialysis patient, patient states was left in the floor for 3 hours. EXAM: CT LUMBAR SPINE WITHOUT CONTRAST TECHNIQUE: Multidetector CT imaging of the lumbar spine was performed without intravenous contrast administration. Multiplanar  CT image reconstructions were also generated. RADIATION DOSE REDUCTION: This exam was performed according to the departmental dose-optimization program which includes automated exposure control, adjustment of the mA and/or kV according to patient size and/or use of iterative reconstruction technique. COMPARISON:  None Available. FINDINGS: Segmentation: 5 lumbar type vertebrae. Alignment: Straightening of the lumbar spine. Grade 1 anterolisthesis of L5 with bilateral spondylolysis. Vertebrae: No acute fracture or focal pathologic process. Paraspinal and other soft tissues: Marked subcutaneous soft tissue edema and skin irregularity about the lower lumbar/pelvic area consistent with history of known decubitus ulcers. No appreciable fluid collection. Disc levels: T12-L1: No significant finding. L1-L2: No significant finding. L2-L3: Mild disc bulge without significant spinal canal or neural foraminal stenosis. L3-L4: Asymmetric disc bulge to the right with mild left lateral recess stenosis. No significant neural foraminal or spinal canal stenosis. L4-L5: Moderate bilateral lateral recess stenosis and mild  spinal canal stenosis. Mild bilateral facet joint arthropathy. L5-S1: Moderate bilateral facet joint arthropathy. No significant spinal canal or neural foraminal stenosis. IMPRESSION: 1.  No acute fracture or traumatic subluxation. 2.  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 3. Skin thickening and deep skin wound in the low lumbar/sacral area consistent with patient's known history of decubitus ulcers. Clinical correlation is suggested. Electronically Signed   By: Keane Police D.O.   On: 02/22/2022 13:43   CT HEAD WO CONTRAST (5MM)  Result Date: 02/22/2022 CLINICAL DATA:  Fall with head and neck trauma. EXAM: CT HEAD WITHOUT CONTRAST CT CERVICAL SPINE WITHOUT CONTRAST TECHNIQUE: Multidetector CT imaging of the head and cervical spine was performed following the standard protocol without intravenous contrast. Multiplanar CT image reconstructions of the cervical spine were also generated. RADIATION DOSE REDUCTION: This exam was performed according to the departmental dose-optimization program which includes automated exposure control, adjustment of the mA and/or kV according to patient size and/or use of iterative reconstruction technique. COMPARISON:  12/18/2021 FINDINGS: CT HEAD FINDINGS Brain: Ventricles, cisterns and other CSF spaces are normal. There is mild chronic ischemic microvascular disease. There is no mass, mass effect, shift of midline structures or acute hemorrhage. No evidence of acute infarction. Vascular: No hyperdense vessel or unexpected calcification. Skull: Normal. Negative for fracture or focal lesion. Sinuses/Orbits: No acute finding. Other: None. CT CERVICAL SPINE FINDINGS Alignment: Normal. Skull base and vertebrae: Vertebral body heights are maintained. There is mild spondylosis throughout the cervical spine to include facet arthropathy and uncovertebral joint spurring. Bilateral neural foraminal narrowing at the C4-5 level and C5-6 levels. No acute fracture.  Atlantoaxial articulation is unremarkable. Soft tissues and spinal canal: Prevertebral soft tissues are normal. No canal stenosis. Disc levels:  Disc space narrowing at the C4-5 and C5-6 levels. Upper chest: No acute findings. Other: None. IMPRESSION: 1. No acute brain injury. 2. Mild chronic ischemic microvascular disease. 3. No acute cervical spine injury. 4. Mild spondylosis of the cervical spine with disc disease at the C4-5 and C5-6 levels. Bilateral neural foraminal narrowing at the C4-5 and C5-6 levels. Electronically Signed   By: Marin Olp M.D.   On: 02/22/2022 13:38   CT Cervical Spine Wo Contrast  Result Date: 02/22/2022 CLINICAL DATA:  Fall with head and neck trauma. EXAM: CT HEAD WITHOUT CONTRAST CT CERVICAL SPINE WITHOUT CONTRAST TECHNIQUE: Multidetector CT imaging of the head and cervical spine was performed following the standard protocol without intravenous contrast. Multiplanar CT image reconstructions of the cervical spine were also generated. RADIATION DOSE REDUCTION: This exam was performed according to the departmental dose-optimization  program which includes automated exposure control, adjustment of the mA and/or kV according to patient size and/or use of iterative reconstruction technique. COMPARISON:  12/18/2021 FINDINGS: CT HEAD FINDINGS Brain: Ventricles, cisterns and other CSF spaces are normal. There is mild chronic ischemic microvascular disease. There is no mass, mass effect, shift of midline structures or acute hemorrhage. No evidence of acute infarction. Vascular: No hyperdense vessel or unexpected calcification. Skull: Normal. Negative for fracture or focal lesion. Sinuses/Orbits: No acute finding. Other: None. CT CERVICAL SPINE FINDINGS Alignment: Normal. Skull base and vertebrae: Vertebral body heights are maintained. There is mild spondylosis throughout the cervical spine to include facet arthropathy and uncovertebral joint spurring. Bilateral neural foraminal narrowing at  the C4-5 level and C5-6 levels. No acute fracture. Atlantoaxial articulation is unremarkable. Soft tissues and spinal canal: Prevertebral soft tissues are normal. No canal stenosis. Disc levels:  Disc space narrowing at the C4-5 and C5-6 levels. Upper chest: No acute findings. Other: None. IMPRESSION: 1. No acute brain injury. 2. Mild chronic ischemic microvascular disease. 3. No acute cervical spine injury. 4. Mild spondylosis of the cervical spine with disc disease at the C4-5 and C5-6 levels. Bilateral neural foraminal narrowing at the C4-5 and C5-6 levels. Electronically Signed   By: Marin Olp M.D.   On: 02/22/2022 13:38   DG Chest Portable 1 View  Result Date: 02/22/2022 CLINICAL DATA:  Fall out of bed this morning. EXAM: PORTABLE CHEST 1 VIEW COMPARISON:  02/07/2022 FINDINGS: Right IJ venous catheter unchanged. Lungs are adequately inflated with mild hazy prominence of the right perihilar vessels likely mild asymmetric vascular congestion. No airspace consolidation or effusion. No pneumothorax. Cardiomediastinal silhouette and remainder of the exam is unchanged. No acute fracture visualized. IMPRESSION: Suggestion of mild asymmetric vascular congestion. Electronically Signed   By: Marin Olp M.D.   On: 02/22/2022 10:38    EKG: I independently viewed the EKG done and my findings are as followed: Sinus tachycardia rate of 98.  Nonspecific ST-T changes.  QTc 415.  Assessment/Plan Present on Admission:  Sepsis ([place])  Principal Problem:   Sepsis ([place])  Sepsis secondary to necrotic sacral decubitus wounds, POA Leukocytosis, tachycardia. Started on Rocephin, IV Flagyl and Rocephin in the ED, continue. Follow cultures Monitor fever curve and WBC Not on IV fluid due to ESRD and mild volume overload on exam.  ESRD on HD TTS Last hemodialysis session was on Saturday Nephrology consulted to resume hemodialysis while inpatient.  Chronic bilateral lower extremity edema Bilateral lower  extremity Doppler ultrasound done on 01/12/2022 no evidence of DVT.   Elevate lower extremities.  Type 2 diabetes with hyperglycemia Last hemoglobin A1c 10.0 on 12/18/2021 Insulin sliding scale every 4 hours while NPO  Physical deconditioning with fall PT OT to assess with activity as tolerated Fall precautions TOC consulted to assist with DC planning  Hyperlipidemia Resume home regimen  Diabetic polyneuropathy Resume home gabapentin  Essential hypertension, now soft BPs Hold off ome oral antihypertensives Maintain MAP>65 as able Will add Midodrine if MAP drops below 65.    Critical care time: 65 minutes.    DVT prophylaxis: Subcu heparin 3 times daily  Code Status: DNR, confirmed by the patient and medical POA at bedside (Her friend).  Family Communication: Medical POA, who is also her friend, bedside.  Disposition Plan: Admitted to telemetry surgical unit.  Consults called: General surgery and nephrology.  Admission status: Inpatient status.   Status is: Inpatient The patient requires at least 2 midnights for further evaluation and  treatment for her present condition.   Kayleen Memos MD [facility] Pager [phone]  If 7PM-7AM, please contact night-coverage [email or web address] Password [facility]  02/22/2022, 2:17 PM

## 2022-02-22 NOTE — Progress Notes (Signed)
PHARMACIST - PHYSICIAN COMMUNICATION  CONCERNING:  Enoxaparin (Lovenox) for DVT Prophylaxis    RECOMMENDATION: Patient was prescribed enoxaprin 40mg  q24 hours for VTE prophylaxis.   Filed Weights   02/22/22 0934  Weight: 113.4 kg (250 lb)    Body mass index is 45.73 kg/m.  Estimated Creatinine Clearance: 22.8 mL/min (A) (by C-G formula based on SCr of 2.97 mg/dL (H)).   Based on [place] patient is candidate for enoxaparin 0.5mg /kg TBW SQ every 24 hours based on BMI being >30.  DESCRIPTION: Pharmacy has adjusted enoxaparin dose per [facility] policy.  Patient is now receiving enoxaparin 57.5 mg every 24 hours    Mills Koller, PharmD Clinical Pharmacist  02/22/2022 2:15 PM

## 2022-02-22 NOTE — ED Triage Notes (Signed)
Pt to ED [facility] from [facility] for fall out of bed around 0630, pt states was left in the floor for 3 hours. Pt with multiple necrotic bed sores to bilateral thighs and buttocks.  Dialysis pt, last dialysis Friday. C/o lower back pain and bilateral hip pain Denies hitting head, denies blood thinner use.

## 2022-02-22 NOTE — Consult Note (Signed)
Pharmacy Antibiotic Note  Theresa Warren is a 66 y.o. female admitted on 02/22/2022 with sepsis.  Pharmacy has been consulted for Vancomycin dosing.  ESRD with HD on Tu,Th and Sat   Plan: Vancomycin 2000mg  IV load given on 7/23. Will plan for 1000mg  after each HD session and draw level at steady state.   Height: 5\' 2"  (157.5 cm) Weight: 113.4 kg (250 lb) IBW/kg (Calculated) : 50.1  Temp (24hrs), Avg:97.9 F (36.6 C), Min:97.8 F (36.6 C), Max:98 F (36.7 C)  Recent Labs  Lab 02/22/22 0930 02/22/22 0932  WBC 21.5*  --   CREATININE 2.97*  --   LATICACIDVEN  --  1.5    Estimated Creatinine Clearance: 22.8 mL/min (A) (by C-G formula based on SCr of 2.97 mg/dL (H)).    Allergies  Allergen Reactions   Codeine Nausea And Vomiting   Sulfa Antibiotics Swelling   Clindamycin/Lincomycin Rash   Dilaudid [Hydromorphone Hcl] Rash   Penicillins Rash    Antimicrobials this admission: 7/23 Ceftriaxone >>  7/23 Metronidazole >>  7/23 Vancomycin >>  Dose adjustments this admission: ESRD  Microbiology results: 7/23 BCx: pending  Thank you for allowing pharmacy to be a part of this patient's care.  Matin Mattioli Rodriguez-Guzman PharmD, BCPS 02/22/2022 5:17 PM

## 2022-02-22 NOTE — ED Notes (Signed)
Pt given ice chips

## 2022-02-22 NOTE — ED Notes (Signed)
Pt requesting more pain medications. Dr Jari Pigg notified.

## 2022-02-22 NOTE — Progress Notes (Signed)
CODE SEPSIS - PHARMACY COMMUNICATION  **Broad Spectrum Antibiotics should be administered within 1 hour of Sepsis diagnosis**  Time Code Sepsis Called/Page Received: 02/22/2022 @ 10:30  Antibiotics Ordered: Vancomycin, Ceftriaxone  Time of 1st antibiotic administration: Ceftriaxone 02/22/2022 @ 10:45  Additional action taken by pharmacy: None required  If necessary, Name of Provider/Nurse Contacted: N/A   Gretel Acre, PharmD PGY1 Pharmacy Resident 02/22/2022 10:35 AM

## 2022-02-22 NOTE — ED Provider Notes (Signed)
[facility] Provider Note    Event Date/Time   First MD Initiated Contact with Patient 02/22/22 [phone]     (approximate)   History   Fall   HPI  Theresa Warren is a 66 y.o. female from [place] who comes in with a fall.  Patient reports that she had mechanical fall out of bed where she slipped off the air mattress and was on the ground for 3 hours where she could not get up.  Patient reports having chronic bedsores that are being debrided and she supposed to have debrided today.  She reports that she was recently put on dialysis and was last dialyzed on Friday.  Patient complaining of a lot of lower back pain and hip pain.  Does not think she hit her head denies any blood thinners.  Physical Exam   Triage Vital Signs: ED Triage Vitals  Enc Vitals Group     BP 02/22/22 0933 (!) 136/58     Pulse Rate 02/22/22 0938 99     Resp 02/22/22 0938 18     Temp 02/22/22 0939 97.8 F (36.6 C)     Temp Source 02/22/22 0939 Oral     SpO2 02/22/22 0933 100 %     Weight 02/22/22 0934 250 lb (113.4 kg)     Height 02/22/22 0934 5\' 2"  (1.575 m)     Head Circumference --      Peak Flow --      Pain Score 02/22/22 0934 10     Pain Loc --      Pain Edu? --      Excl. in [place]? --     Most recent vital signs: Vitals:   02/22/22 0938 02/22/22 0939  BP:    Pulse: 99   Resp: 18   Temp:  97.8 F (36.6 C)  SpO2:       General: Awake, no distress.  CV:  Good peripheral perfusion.  Resp:  Normal effort.  Abd:  No distention.  Other:  Necrotic wounds noted on her buttock and bilateral thighs.  Patient is able to wiggle her bilateral feet without any tenderness noted.  Dialysis catheter noted in the right chest wall.   ED Results / Procedures / Treatments   Labs (all labs ordered are listed, but only abnormal results are displayed) Labs Reviewed  CBC - Abnormal; Notable for the following components:      Result Value   WBC 21.5 (*)    RBC 2.88  (*)    Hemoglobin 7.9 (*)    HCT 26.6 (*)    MCHC 29.7 (*)    RDW 18.9 (*)    Platelets 447 (*)    All other components within normal limits  BASIC METABOLIC PANEL - Abnormal; Notable for the following components:   Glucose, Bld 242 (*)    Creatinine, Ser 2.97 (*)    GFR, Estimated 17 (*)    All other components within normal limits  CULTURE, BLOOD (ROUTINE X 2)  CULTURE, BLOOD (ROUTINE X 2)  LACTIC ACID, PLASMA  LACTIC ACID, PLASMA  HEPATIC FUNCTION PANEL  CK     EKG  My interpretation of EKG:  Sinus tachycardia rate of 98 without any ST elevation or T wave inversions, normal intervals, occasional PAC  RADIOLOGY I have reviewed the xray personally and interpreted and see no evidence of any pneumonia  I reviewed the CT head personally interpreted no evidence of intercranial hemorrhage  PROCEDURES:  Critical  Care performed: Yes, see critical care procedure note(s)  .1-3 Lead EKG Interpretation  Performed by: Vanessa Foreston, MD Authorized by: Vanessa Adams, MD     Interpretation: abnormal     ECG rate:  100   ECG rate assessment: tachycardic     Rhythm: sinus tachycardia     Ectopy: PAC     Conduction: normal   .Critical Care  Performed by: Vanessa Hilbert, MD Authorized by: Vanessa [place], MD   Critical care provider statement:    Critical care time (minutes):  30   Critical care was necessary to treat or prevent imminent or life-threatening deterioration of the following conditions:  Sepsis   Critical care was time spent personally by me on the following activities:  Development of treatment plan with patient or surrogate, discussions with consultants, evaluation of patient's response to treatment, examination of patient, ordering and review of laboratory studies, ordering and review of radiographic studies, ordering and performing treatments and interventions, pulse oximetry, re-evaluation of patient's condition and review of old charts    MEDICATIONS ORDERED IN  ED: Medications  vancomycin (VANCOREADY) IVPB 2000 mg/400 mL (2,000 mg Intravenous New Bag/Given 02/22/22 1130)  fentaNYL (SUBLIMAZE) injection 50 mcg (50 mcg Intravenous Given 02/22/22 1045)  ondansetron (ZOFRAN) injection 4 mg (4 mg Intravenous Given 02/22/22 1044)  cefTRIAXone (ROCEPHIN) 2 g in sodium chloride 0.9 % 100 mL IVPB (0 g Intravenous Stopped 02/22/22 1126)     IMPRESSION / MDM / [place] / ED COURSE  I reviewed the triage vital signs and the nursing notes.   Patient's presentation is most consistent with acute presentation with potential threat to life or bodily function.    Differential includes intracranial hemorrhage, cervical fracture, lumbar fracture, abdominal injuries.  Patient white count significantly elevated therefore will get blood cultures, lactate.  Suspect the sources for chronic wounds that are necrotic in nature.  CBC elevated white count.  BMP creatinine elevated with patient's dialysis patient.  Lactate normal  White count came back elevated therefore will do sepsis alert on start on dose of IV antibiotics suspect source is the necrotic source.  CT head and neck are negative patient given some IV fentanyl for pain.  We will discuss with hospital team for admission for sepsis secondary to to chronic back wounds for debridement   The patient is on the cardiac monitor to evaluate for evidence of arrhythmia and/or significant heart rate changes.      FINAL CLINICAL IMPRESSION(S) / ED DIAGNOSES   Final diagnoses:  Sepsis, due to unspecified organism, unspecified whether acute organ dysfunction present ([place])  Wound of buttock, unspecified laterality, initial encounter  ESRD (end stage renal disease) ([place])     Rx / DC Orders   ED Discharge Orders     None        Note:  This document was prepared using Dragon voice recognition software and may include unintentional dictation errors.   Vanessa McCrory, MD 02/22/22 1350

## 2022-02-23 ENCOUNTER — Inpatient Hospital Stay: Payer: Medicare Other

## 2022-02-23 DIAGNOSIS — A419 Sepsis, unspecified organism: Secondary | ICD-10-CM | POA: Diagnosis not present

## 2022-02-23 DIAGNOSIS — S31829A Unspecified open wound of left buttock, initial encounter: Secondary | ICD-10-CM | POA: Diagnosis not present

## 2022-02-23 DIAGNOSIS — N186 End stage renal disease: Secondary | ICD-10-CM

## 2022-02-23 DIAGNOSIS — R600 Localized edema: Secondary | ICD-10-CM | POA: Diagnosis not present

## 2022-02-23 DIAGNOSIS — S31819A Unspecified open wound of right buttock, initial encounter: Secondary | ICD-10-CM | POA: Diagnosis not present

## 2022-02-23 DIAGNOSIS — Z992 Dependence on renal dialysis: Secondary | ICD-10-CM

## 2022-02-23 DIAGNOSIS — S31809A Unspecified open wound of unspecified buttock, initial encounter: Secondary | ICD-10-CM | POA: Diagnosis not present

## 2022-02-23 DIAGNOSIS — I10 Essential (primary) hypertension: Secondary | ICD-10-CM

## 2022-02-23 DIAGNOSIS — D631 Anemia in chronic kidney disease: Secondary | ICD-10-CM

## 2022-02-23 LAB — COMPREHENSIVE METABOLIC PANEL
ALT: 15 U/L (ref 0–44)
AST: 18 U/L (ref 15–41)
Albumin: 1.8 g/dL — ABNORMAL LOW (ref 3.5–5.0)
Alkaline Phosphatase: 145 U/L — ABNORMAL HIGH (ref 38–126)
Anion gap: 14 (ref 5–15)
BUN: 27 mg/dL — ABNORMAL HIGH (ref 8–23)
CO2: 20 mmol/L — ABNORMAL LOW (ref 22–32)
Calcium: 8.3 mg/dL — ABNORMAL LOW (ref 8.9–10.3)
Chloride: 102 mmol/L (ref 98–111)
Creatinine, Ser: 3.41 mg/dL — ABNORMAL HIGH (ref 0.44–1.00)
GFR, Estimated: 14 mL/min — ABNORMAL LOW (ref >60)
Glucose, Bld: 110 mg/dL — ABNORMAL HIGH (ref 70–99)
Potassium: 4.5 mmol/L (ref 3.5–5.1)
Sodium: 136 mmol/L (ref 135–145)
Total Bilirubin: 1 mg/dL (ref 0.3–1.2)
Total Protein: 5.2 g/dL — ABNORMAL LOW (ref 6.5–8.1)

## 2022-02-23 LAB — CBC WITH DIFFERENTIAL/PLATELET
Abs Immature Granulocytes: 0.25 10*3/uL — ABNORMAL HIGH (ref 0.00–0.07)
Basophils Absolute: 0.2 10*3/uL — ABNORMAL HIGH (ref 0.0–0.1)
Basophils Relative: 1 %
Eosinophils Absolute: 0.5 10*3/uL (ref 0.0–0.5)
Eosinophils Relative: 2 %
HCT: 24 % — ABNORMAL LOW (ref 36.0–46.0)
Hemoglobin: 7.1 g/dL — ABNORMAL LOW (ref 12.0–15.0)
Immature Granulocytes: 1 %
Lymphocytes Relative: 14 %
Lymphs Abs: 2.9 10*3/uL (ref 0.7–4.0)
MCH: 27.6 pg (ref 26.0–34.0)
MCHC: 29.6 g/dL — ABNORMAL LOW (ref 30.0–36.0)
MCV: 93.4 fL (ref 80.0–100.0)
Monocytes Absolute: 2.1 10*3/uL — ABNORMAL HIGH (ref 0.1–1.0)
Monocytes Relative: 10 %
Neutro Abs: 14.9 10*3/uL — ABNORMAL HIGH (ref 1.7–7.7)
Neutrophils Relative %: 72 %
Platelets: 346 10*3/uL (ref 150–400)
RBC: 2.57 MIL/uL — ABNORMAL LOW (ref 3.87–5.11)
RDW: 19.2 % — ABNORMAL HIGH (ref 11.5–15.5)
WBC: 20.8 10*3/uL — ABNORMAL HIGH (ref 4.0–10.5)
nRBC: 0 % (ref 0.0–0.2)

## 2022-02-23 LAB — FERRITIN: Ferritin: 905 ng/mL — ABNORMAL HIGH (ref 11–307)

## 2022-02-23 LAB — GLUCOSE, CAPILLARY
Glucose-Capillary: 108 mg/dL — ABNORMAL HIGH (ref 70–99)
Glucose-Capillary: 108 mg/dL — ABNORMAL HIGH (ref 70–99)
Glucose-Capillary: 161 mg/dL — ABNORMAL HIGH (ref 70–99)
Glucose-Capillary: 196 mg/dL — ABNORMAL HIGH (ref 70–99)
Glucose-Capillary: 268 mg/dL — ABNORMAL HIGH (ref 70–99)

## 2022-02-23 LAB — IRON AND TIBC: Iron: 21 ug/dL — ABNORMAL LOW (ref 28–170)

## 2022-02-23 LAB — HEMOGLOBIN AND HEMATOCRIT, BLOOD
HCT: 21.5 % — ABNORMAL LOW (ref 36.0–46.0)
Hemoglobin: 6.5 g/dL — ABNORMAL LOW (ref 12.0–15.0)

## 2022-02-23 LAB — RETICULOCYTES
Immature Retic Fract: 33.2 % — ABNORMAL HIGH (ref 2.3–15.9)
RBC.: 2.55 MIL/uL — ABNORMAL LOW (ref 3.87–5.11)
Retic Count, Absolute: 68.9 10*3/uL (ref 19.0–186.0)
Retic Ct Pct: 2.7 % (ref 0.4–3.1)

## 2022-02-23 LAB — CBG MONITORING, ED: Glucose-Capillary: 122 mg/dL — ABNORMAL HIGH (ref 70–99)

## 2022-02-23 LAB — FOLATE: Folate: 12.6 ng/mL (ref >5.9)

## 2022-02-23 LAB — PHOSPHORUS: Phosphorus: 3 mg/dL (ref 2.5–4.6)

## 2022-02-23 LAB — VITAMIN B12: Vitamin B-12: 1076 pg/mL — ABNORMAL HIGH (ref 180–914)

## 2022-02-23 LAB — MAGNESIUM: Magnesium: 1.8 mg/dL (ref 1.7–2.4)

## 2022-02-23 MED ORDER — INSULIN ASPART 100 UNIT/ML IJ SOLN
0.0000 [IU] | Freq: Three times a day (TID) | INTRAMUSCULAR | Status: DC
  Administered 2022-02-23 (×2): 1 [IU] via SUBCUTANEOUS
  Administered 2022-02-24 (×2): 3 [IU] via SUBCUTANEOUS
  Administered 2022-02-24 – 2022-03-02 (×7): 1 [IU] via SUBCUTANEOUS
  Filled 2022-02-23 (×10): qty 1

## 2022-02-23 MED ORDER — HYDROMORPHONE HCL 1 MG/ML IJ SOLN
1.0000 mg | INTRAMUSCULAR | Status: DC | PRN
  Administered 2022-02-23 – 2022-02-26 (×8): 1 mg via INTRAVENOUS
  Filled 2022-02-23 (×9): qty 1

## 2022-02-23 MED ORDER — RENA-VITE PO TABS
1.0000 | ORAL_TABLET | Freq: Every day | ORAL | Status: DC
  Administered 2022-02-23 – 2022-03-02 (×8): 1 via ORAL
  Filled 2022-02-23 (×7): qty 1

## 2022-02-23 MED ORDER — JUVEN PO PACK: 1.0000 | PACK | Freq: Two times a day (BID) | ORAL | Status: DC

## 2022-02-23 MED ORDER — PROSOURCE PLUS PO LIQD
30.0000 mL | Freq: Three times a day (TID) | ORAL | Status: DC
  Administered 2022-02-23 – 2022-03-02 (×10): 30 mL via ORAL
  Filled 2022-02-23 (×20): qty 30

## 2022-02-23 MED ORDER — CHLORHEXIDINE GLUCONATE CLOTH 2 % EX PADS
6.0000 | MEDICATED_PAD | Freq: Every day | CUTANEOUS | Status: DC
Start: 2022-02-23 — End: 2022-03-02
  Administered 2022-02-23 – 2022-03-01 (×6): 6 via TOPICAL

## 2022-02-23 NOTE — TOC Initial Note (Addendum)
Transition of Care [facility]) - Initial/Assessment Note    Patient Details  Name: Theresa Warren MRN: [id number] Date of Birth: 05-Oct-1955  Transition of Care [facility]) CM/SW Contact:    Eileen Stanford, LCSW Phone Number: 02/23/2022, 1:18 PM  Clinical Narrative:    CSW spoke with [name], pt's POA. [name] states pt does not want to return to [facility]. Pt is interested in [facility] (1) or [facility] (2). CSW to send referral.                Expected Discharge Plan: Skilled Nursing Facility Barriers to Discharge: Continued Medical Work up   Patient Goals and CMS Choice Patient states their goals for this hospitalization and ongoing recovery are:: pt to go to different snf   Choice offered to / list presented to : [facility] POA / Guardian  Expected Discharge Plan and Services Expected Discharge Plan: [place] In-house Referral: Clinical Social Work   Post Acute Care Choice: [place] Living arrangements for the past 2 months: [place]                                      Prior Living Arrangements/Services Living arrangements for the past 2 months: Single Family Home Lives with:: Adult Children Patient language and need for interpreter reviewed:: Yes Do you feel safe going back to the place where you live?: Yes      Need for Family Participation in Patient Care: Yes (Comment) Care giver support system in place?: Yes (comment)   Criminal Activity/Legal Involvement Pertinent to Current Situation/Hospitalization: No - Comment as needed  Activities of Daily Living      Permission Sought/Granted Permission sought to share information with : Family Supports Permission granted to share information with : Yes, Verbal Permission Granted  Share Information with NAME: [name]     Permission granted to share info w Relationship: HCPOA     Emotional Assessment Appearance:: Appears stated age     Orientation: : Oriented  to Self, Oriented to Place, Oriented to  Time, Oriented to Situation Alcohol / Substance Use: Not Applicable Psych Involvement: No (comment)  Admission diagnosis:  ESRD (end stage renal disease) ([place]) [N18.6] Wound of buttock, unspecified laterality, initial encounter [S31.809A] Sepsis ([place]) [A41.9] Sepsis, due to unspecified organism, unspecified whether acute organ dysfunction present [facility]) [A41.9] Patient Active Problem List   Diagnosis Date Noted   Wound of buttock    Bilateral lower extremity edema    Anemia due to chronic kidney disease, on chronic dialysis ([place])    Hypertension    Sepsis ([place]) 02/22/2022   ESRD (end stage renal disease) ([place])    Calciphylaxis    Anasarca associated with disorder of kidney 01/05/2022   Premature atrial complexes 01/02/2022   Sacral wound, initial encounter 01/01/2022   Swelling of left lower extremity 01/01/2022   Abdominal pain 01/01/2022   AKI (acute kidney injury) ([place]) 01/01/2022   Hyponatremia 01/01/2022   Symptomatic anemia 12/31/2021   Elevated LFTs 12/31/2021   Severe sepsis ([place]) 12/31/2021   Hypokalemia 12/21/2021   Pressure injury of skin 12/20/2021   Hypomagnesemia 12/19/2021   Acute blood loss anemia 12/19/2021   Abnormal vaginal bleeding 12/19/2021   Uncontrolled type 2 diabetes mellitus with hyperglycemia, with long-term current use of insulin ([place]) 12/19/2021   Chronic kidney disease, stage 5 ([place]) 12/18/2021   Chronic pain syndrome 12/18/2021  Fall at home 12/18/2021   DNR (do not resuscitate)/DNI(Do Not Intubate) 12/18/2021   Type 2 diabetes mellitus with chronic kidney disease, with long-term current use of insulin ([place]) 12/18/2021   Anemia of renal disease 12/18/2021   Long term (current) use of insulin ([place]) 08/30/2018   OSA (obstructive sleep apnea) 11/27/2014   Sleep disturbance 10/10/2014   Severe obesity (BMI >= 40) ([place]) 10/10/2014   Palpitation 09/11/2014   Dyspnea 09/11/2014   Morbid (severe) obesity due  to excess calories ([place]) 07/01/2010   Essential hypertension 06/06/2009   PCP:  Crist Infante, MD Pharmacy:   [facility] [place] [address] Phone: [phone] Fax: [phone]     Social Determinants of Health (SDOH) Interventions    Readmission Risk Interventions     No data to display

## 2022-02-23 NOTE — Evaluation (Signed)
Physical Therapy Evaluation Patient Details Name: Theresa Warren MRN: [id number] DOB: 1956-04-09 Today's Date: 02/23/2022  History of Present Illness  Pt is a 66 y.o. female from [place] who comes in with a fall.  Patient reports that she had mechanical fall out of bed where she slipped off the air mattress and was on the ground for 3 hours where she could not get up. MD assessment includes: sepsis secondary to necrotic sacral decubitus wounds, anemia, and physical deconditioning with fall.  PMH significant for chronic anemia. hyperlipidemia, hypertension, CKD stage V, insulin-dependent diabetes mellitus.   Clinical Impression  Pt anxious and required significant education and encouragement to participate with therapy.  Once pt agreed to participate she ended up putting forth fair effort during the session but remained somewhat anxious and self-limiting at times.  Pt required significant +2 assistance with functional tasks and was unable to come to full upright standing position or to ambulate this session.  Pt will benefit from PT services in a SNF setting upon discharge to safely address deficits listed in patient problem list for decreased caregiver assistance and eventual return to PLOF.        Recommendations for follow up therapy are one component of a multi-disciplinary discharge planning process, led by the attending physician.  Recommendations may be updated based on patient status, additional functional criteria and insurance authorization.  Follow Up Recommendations Skilled nursing-short term rehab (<3 hours/day) Can patient physically be transported by private vehicle: No    Assistance Recommended at Discharge Frequent or constant Supervision/Assistance  Patient can return home with the following  Two people to help with walking and/or transfers;Two people to help with bathing/dressing/bathroom;Assistance with cooking/housework;Assist for transportation;Help with  stairs or ramp for entrance    Equipment Recommendations None recommended by PT  Recommendations for Other Services       Functional Status Assessment Patient has had a recent decline in their functional status and demonstrates the ability to make significant improvements in function in a reasonable and predictable amount of time.     Precautions / Restrictions Precautions Precautions: Fall Precaution Comments: sacral & leg wounds Restrictions Weight Bearing Restrictions: No Other Position/Activity Restrictions: History of falling with staff present, careful      Mobility  Bed Mobility Overal bed mobility: Needs Assistance Bed Mobility: Sit to Supine, Supine to Sit     Supine to sit: +2 for physical assistance, Min assist Sit to supine: +2 for physical assistance, Max assist   General bed mobility comments: Extra time and cuing for sequencing needed along with encouragement for effort    Transfers Overall transfer level: Needs assistance Equipment used: Rolling walker (2 wheels) Transfers: Sit to/from Stand Sit to Stand: +2 physical assistance, Mod assist          Lateral/Scoot Transfers: Supervision General transfer comment: Pt able to clear the surface of the bed with +2 Mod A during sit to stand transfer training but unable to come to full upright position; pt able to laterally scoot at the EOB around 2-3 inches with poor clearance; heavy cuing for sequencing with all transfer training    Ambulation/Gait               General Gait Details: Unable/unsafe to attempt  Stairs            Wheelchair Mobility    Modified Rankin (Stroke Patients Only)       Balance Overall balance assessment: Needs assistance, History of Falls Sitting-balance support: Feet supported,  Bilateral upper extremity supported Sitting balance-Leahy Scale: Fair     Standing balance support: Bilateral upper extremity supported, Reliant on assistive device for  balance Standing balance-Leahy Scale: Poor                               Pertinent Vitals/Pain Pain Assessment Pain Assessment: 0-10 Pain Score: 8  Pain Location: BLE thighs and groin area Pain Descriptors / Indicators: Grimacing, Guarding, Moaning Pain Intervention(s): Repositioned, Monitored during session, Premedicated before session    Home Living Family/patient expects to be discharged to:: Skilled nursing facility Living Arrangements: Alone Available Help at Discharge: Other (Comment) (No support)   Home Access: Stairs to enter Entrance Stairs-Rails: Right Entrance Stairs-Number of Steps: 4-5   Home Layout: Two level;Able to live on main level with bedroom/bathroom Home Equipment: Kasandra Knudsen - single point Additional Comments: bought toilet rails but hasn't been able to use them yet, has a 2WW that was her mother's that is in storage but hadn't needed it prior to multiple recent admissions and SNF stays    Prior Function Prior Level of Function : History of Falls (last six months)             Mobility Comments: At rehab, was able to sit EOB with therapy assist, stood once or twice with a tech ADLs Comments: At rehab, needed some help with ADL     Hand Dominance        Extremity/Trunk Assessment   Upper Extremity Assessment Upper Extremity Assessment: Generalized weakness    Lower Extremity Assessment Lower Extremity Assessment: Generalized weakness       Communication   Communication: No difficulties  Cognition Arousal/Alertness: Awake/alert Behavior During Therapy: Anxious Overall Cognitive Status: No family/caregiver present to determine baseline cognitive functioning                                 General Comments: anxious, fearful of falling, needs a lot of encouragement, extra time needed occasionally with word finding        General Comments      Exercises Total Joint Exercises Ankle Circles/Pumps: AROM,  Strengthening, Both, 10 reps Long Arc Quad: AROM, Strengthening, Both, 10 reps Knee Flexion: AROM, Strengthening, Both, 10 reps Other Exercises Other Exercises: Static sitting at EOB for improved activity tolerance Other Exercises: Pt education provided on physiological benefits of activity   Assessment/Plan    PT Assessment Patient needs continued PT services  PT Problem List Decreased strength;Decreased activity tolerance;Decreased balance;Decreased mobility;Decreased knowledge of use of DME;Pain       PT Treatment Interventions DME instruction;Therapeutic exercise;Gait training;Balance training;Stair training;Functional mobility training;Therapeutic activities;Patient/family education    PT Goals (Current goals can be found in the Care Plan section)  Acute Rehab PT Goals Patient Stated Goal: To get stronger and get back home PT Goal Formulation: With patient Time For Goal Achievement: 03/08/22 Potential to Achieve Goals: Fair    Frequency Min 2X/week     Co-evaluation PT/OT/SLP Co-Evaluation/Treatment: Yes Reason for Co-Treatment: Complexity of the patient's impairments (multi-system involvement);Necessary to address cognition/behavior during functional activity;For patient/therapist safety PT goals addressed during session: Mobility/safety with mobility;Strengthening/ROM         AM-PAC PT "6 Clicks" Mobility  Outcome Measure Help needed turning from your back to your side while in a flat bed without using bedrails?: A Little Help needed moving from lying on your back  to sitting on the side of a flat bed without using bedrails?: A Lot Help needed moving to and from a bed to a chair (including a wheelchair)?: A Lot Help needed standing up from a chair using your arms (e.g., wheelchair or bedside chair)?: A Lot Help needed to walk in hospital room?: Total Help needed climbing 3-5 steps with a railing? : Total 6 Click Score: 11    End of Session Equipment Utilized During  Treatment: Gait belt Activity Tolerance: Patient tolerated treatment well Patient left: in bed;with call bell/phone within reach;with bed alarm set Nurse Communication: Mobility status PT Visit Diagnosis: Unsteadiness on feet (R26.81);Muscle weakness (generalized) (M62.81);Difficulty in walking, not elsewhere classified (R26.2);History of falling (Z91.81);Pain Pain - Right/Left:  (BLE) Pain - part of body: Leg    Time: 6237-6283 PT Time Calculation (min) (ACUTE ONLY): 45 min   Charges:   PT Evaluation $PT Eval Moderate Complexity: 1 Mod PT Treatments $Therapeutic Activity: 8-22 mins       D. Royetta Asal PT, DPT 02/23/22, 11:37 AM

## 2022-02-23 NOTE — Consult Note (Addendum)
WOC consulted for multiple pressure injuries Reviewed chart, surgery consulted and had followed patient last admission Contacted surgery and discussed care via [place]. Orders updated after review of wound care needs with surgery team Added low air loss mattress for moisture management and pressure redistribution Added consult to RD for nutritional supplementation for wound healing  Betadine dressings (per surgery orders) for the bilateral buttock wounds BID Added betadine to the stable hip eschar right and left trochanter  Silicone foam to the LE wounds, appears related to fall  Re consult if needed, will not follow at this time. Thanks  Karmelo Bass [employer] MSN, RN,CWOCN, CNS, CWON-AP [phone])   Addendum: from surgery consultation notes Right Lateral Hip: 23 X 8 cm, this is dry eschar, no evidence of infection nor necrosis   Right Lateral Thigh: 7 x 7 cm, this is dry eschar, no evidence of infection nor necrosis   Left Anterior Hip: 9 x 4 cm, this is dry eschar, no evidence of infection nor necrosis   Gluteal Wounds: Healing well, some fibrinous exudate, no infection    Let Great Trochanter: 18 x 8 x 2 cm, eschar with some fibrinous tissue...sharply debrided at bedside, no gross evidence of necrosis  Based on this review and review of images: R trochanter: Unstageable Pressure Injury R lateral thigh wounds x 2; Full thickness skin ulcerations; calciphylaxis  Left trochanter: Unstageable Pressure Injury Right ischial: Stage 3 Pressure Injury Left ischial: Stage 3 Pressure Injury Left buttock x 3 areas, full thickness ulceration; each Unstageable Pressure injury in combination with moisture exposure.  Left inner thigh: full thickness skin ulcerations; calciphylaxis

## 2022-02-23 NOTE — Progress Notes (Addendum)
PROGRESS NOTE    Theresa Warren  IZT:[id number] DOB: Mar 17, 1956 DOA: 02/22/2022 PCP: Crist Infante, MD   Chief Complaint  Patient presents with   Fall    Brief Narrative:  HPI per Dr. Dyke Brackett Theresa Warren is a 66 y.o. 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     The patient presents to [facility] ED from SNF via EMS due to a fall, she slid off her bed.  Imaging in the ED is reassuring, no fractures.  Noted to be tachycardic with leukocytosis greater than 21000 with concern for sepsis secondary to necrotic sacral decubitus wounds.  The patient was started on empiric broad-spectrum IV antibiotics, IV vancomycin, Rocephin and IV Flagyl.  EDP discussed the case with general surgery.  The patient may require I&D.  Made n.p.o. until seen by general surgery.  The patient was admitted by [facility], hospitalist service.   ED Course: Tmax 98.0.  BP 121/54.  Pulse 108, respiration rate 21, O2 saturation 100% on room air.  Lab studies remarkable for serum glucose 242, alkaline phosphatase 190.  WBC 21.5.  Hemoglobin 7.9.  Platelet count 447.  Lactic acid 1.5.  Assessment & Plan:   Principal Problem:   Sepsis ([place]) Active Problems:   Chronic pain syndrome   Anemia of renal disease   Type 2 diabetes mellitus with chronic kidney disease, with long-term current use of insulin (HCC)   Severe obesity (BMI >= 40) (HCC)   ESRD (end stage renal disease) (HCC)   Wound of buttock   Bilateral lower extremity edema   Anemia due to chronic kidney disease, on chronic dialysis ([place])   Hypertension  #1 sepsis secondary to necrotic sacral decubitus wounds,  POA -Patient presenting with criteria for sepsis with leukocytosis, tachycardia, sacral pain with concern for infected necrotic sacral decubitus wounds. -Blood cultures pending. -General surgery consulted, per RN patient underwent I and D this morning at bedside. -Follow fever curve, leukocytosis. -Continue empiric IV Rocephin, IV Flagyl, IV vancomycin. -IV Dilaudid as needed pain.  Continue home regimen oxycodone as needed. -Wound care RN following. -General surgery following.  2.  End-stage renal disease on HD TTS -Per admitting physician last HD session noted to be Saturday. -Nephrology consulted to resume hemodialysis in-house.  3.  Chronic bilateral lower extremity edema -May be secondary to volume overload from end-stage renal disease. -Patient noted to have had bilateral lower extremity Doppler ultrasound on 01/12/2022 with no evidence of DVT. -Keep lower extremities elevated.  4.  Type 2 diabetes mellitus with hyperglycemia -Last hemoglobin A1c 10.0 on 12/18/2021. -Change sliding scale insulin to before meals and at bedtime.  5.  Anemia -Likely anemia of chronic disease secondary to end-stage renal disease. -Hemoglobin at 7.1 this morning was 9.0 on 02/06/2022. -Patient with no overt bleeding. -Check an anemia panel. -Repeat H&H. -Transfusion threshold hemoglobin < 7.  -If patient needs a transfusion will likely need to be given during HD.  6.  Hyperlipidemia -Lipitor.  7.  Diabetic polyneuropathy/chronic pain -Continue home regimen Neurontin. -Continue home regiment pain medications  8.  Physical deconditioning with fall -PT/OT. -Fall precautions. -Likely needs SNF placement. -TOC consult.  9.  Hypertension -Patient noted to have soft blood pressures on presentation. -Continue to hold home  regimen oral antihypertensive medications. -Supportive care.  10 Severe obesity/morbid obesity -Lifestyle modification. -Outpatient follow-up with PCP.  11, endometrial  thickness -Noted on CT abdomen and pelvis with a endometrial canal measuring 13 mm thickness in the lower urinary segment. -Check a pelvic ultrasound. -If pelvic ultrasound is abnormal will need OB/GYN consult.    DVT prophylaxis: Heparin Code Status: Full Family Communication: Updated patient.  No family at bedside. Disposition: Likely back to SNF when clinically improved.  Status is: Inpatient Remains inpatient appropriate because: Severity of illness   Consultants:  General surgery 02/23/2022 Nephrology pending  Procedures:  Bedside I and D by general surgery per RN 02/23/2022 CT abdomen and pelvis 02/22/2022 CT head CT C-spine 02/22/2022  Antimicrobials:  IV Rocephin 02/22/2022>>>> IV Flagyl 02/22/2022>>>> IV vancomycin 02/22/2022>>>>   Subjective: Patient laying in bed, tearful, complaining of pain in her buttocks and bilateral chronic lower extremity neuropathic pain.  States does not make any urine.  Denies any chest pain.  Denies any shortness of breath.  States she is on HD Tuesday Thursday Saturday and states she had dialysis done yesterday.  Per RN had a bedside I&D this morning by general surgery.  Objective: Vitals:   02/23/22 0000 02/23/22 0103 02/23/22 0422 02/23/22 0735  BP: (!) 130/53 135/66 (!) 109/53 122/71  Pulse: 99 (!) 108 92 95  Resp: 15 16 16 15   Temp:  98.9 F (37.2 C) 98.3 F (36.8 C) 98.2 F (36.8 C)  TempSrc:  Oral    SpO2: 100% 100% 99% 100%  Weight:      Height:        Intake/Output Summary (Last 24 hours) at 02/23/2022 1053 Last data filed at 02/23/2022 1011 Gross per 24 hour  Intake 340 ml  Output 0 ml  Net 340 ml   Filed Weights   02/22/22 0934  Weight: 113.4 kg    Examination:  General exam: Appears calm and comfortable.  Dry mucous membranes Respiratory system: Bibasilar crackles.  No wheezing.  No rhonchi.  Fair air movement.  Speaking in full sentences.  Cardiovascular system: Tachycardia.  No murmurs rubs or gallops.  No  JVD.  1-2+ bilateral lower extremity edema. Gastrointestinal system: Abdomen is obese, nondistended, soft and nontender. No organomegaly or masses felt. Normal bowel sounds heard. Central nervous system: Alert and oriented. No focal neurological deficits. Extremities: Symmetric 5 x 5 power. Skin: Sacral decubitus ulcers with areas of necrotic eschar noted. Psychiatry: Judgement and insight appear normal. Mood & affect appropriate.          Data Reviewed: I have personally reviewed following labs and imaging studies  CBC: Recent Labs  Lab 02/22/22 0930 02/23/22 0740  WBC 21.5* 20.8*  NEUTROABS  --  14.9*  HGB 7.9* 7.1*  HCT 26.6* 24.0*  MCV 92.4 93.4  PLT 447* 355    Basic Metabolic Panel: Recent Labs  Lab 02/22/22 0930 02/23/22 0417  NA 138 136  K 4.3 4.5  CL 100 102  CO2 23 20*  GLUCOSE 242* 110*  BUN 23 27*  CREATININE 2.97* 3.41*  CALCIUM 9.0 8.3*  MG  --  1.8  PHOS  --  3.0    GFR: Estimated Creatinine Clearance: 19.8 mL/min (A) (by C-G formula based on SCr of 3.41 mg/dL (H)).  Liver Function Tests: Recent Labs  Lab 02/22/22 0930 02/23/22 0417  AST 23 18  ALT 20 15  ALKPHOS 190* 145*  BILITOT 1.1 1.0  PROT 6.7 5.2*  ALBUMIN 2.4* 1.8*  CBG: Recent Labs  Lab 02/22/22 1745 02/22/22 1936 02/23/22 0020 02/23/22 0417 02/23/22 0734  GLUCAP 206* 156* 122* 108* 108*     Recent Results (from the past 240 hour(s))  Blood culture (routine x 2)     Status: None (Preliminary result)   Collection Time: 02/22/22 10:29 AM   Specimen: BLOOD  Result Value Ref Range Status   Specimen Description BLOOD RIGHT ANTECUBITAL  Final   Special Requests   Final    BOTTLES DRAWN AEROBIC AND ANAEROBIC Blood Culture results may not be optimal due to an inadequate volume of blood received in culture bottles   Culture   Final    NO GROWTH < 24 HOURS Performed at [facility], [address] [place], [place] [address]    Report Status PENDING   Incomplete  Blood culture (routine x 2)     Status: None (Preliminary result)   Collection Time: 02/22/22 10:29 AM   Specimen: BLOOD  Result Value Ref Range Status   Specimen Description BLOOD Blood Culture adequate volume  Final   Special Requests   Final    BOTTLES DRAWN AEROBIC AND ANAEROBIC LEFT ANTECUBITAL   Culture   Final    NO GROWTH < 24 HOURS Performed at [facility], [address] [place], [place] [address]    Report Status PENDING  Incomplete         Radiology Studies: CT ABDOMEN PELVIS WO CONTRAST  Result Date: 02/22/2022 CLINICAL DATA:  Patient fell out of bed.  Found down.  Bedsores. EXAM: CT ABDOMEN AND PELVIS WITHOUT CONTRAST TECHNIQUE: Multidetector CT imaging of the abdomen and pelvis was performed following the standard protocol without IV contrast. RADIATION DOSE REDUCTION: This exam was performed according to the departmental dose-optimization program which includes automated exposure control, adjustment of the mA and/or kV according to patient size and/or use of iterative reconstruction technique. COMPARISON:  01/01/2022 FINDINGS: Lower chest: Nonacute posterolateral right rib fractures evident. Hepatobiliary: Nodular liver contour is compatible with cirrhosis. No suspicious focal abnormality in the liver on this study without intravenous contrast. There is no evidence for gallstones, gallbladder wall thickening, or pericholecystic fluid. No intrahepatic or extrahepatic biliary dilation. Pancreas: No focal mass lesion. No dilatation of the main duct. No intraparenchymal cyst. No peripancreatic edema. Spleen: 16 mm low-density lesion in the medial spleen is stable, likely benign. Adrenals/Urinary Tract: No adrenal nodule or mass. Tiny bilateral renal lesions of varying attenuation are stable, likely a combination of simple and complex cyst. No evidence for hydroureter. Bladder is distended. Stomach/Bowel: Stomach is unremarkable. No gastric wall thickening. No  evidence of outlet obstruction. Duodenum is normally positioned as is the ligament of Treitz. No small bowel wall thickening. No small bowel dilatation. The terminal ileum is normal. The appendix is not well visualized, but there is no edema or inflammation in the region of the cecum. No gross colonic mass. No colonic wall thickening. Vascular/Lymphatic: There is mild atherosclerotic calcification of the abdominal aorta without aneurysm. There is no gastrohepatic or hepatoduodenal ligament lymphadenopathy. No retroperitoneal or mesenteric lymphadenopathy. No pelvic sidewall lymphadenopathy. Reproductive: Endometrial canal measures 13 mm thickness in the lower uterine segment, abnormal in a postmenopausal female. There is no adnexal mass. Other: No intraperitoneal free fluid. Musculoskeletal: No worrisome lytic or sclerotic osseous abnormality. No evidence for lumbar spine fracture Bilateral pars interarticularis defects noted at L5. Sacral decubitus ulcer is evident. IMPRESSION: 1. No acute findings in the abdomen or pelvis. 2. Nodular liver contour compatible with  cirrhosis. 3. Endometrial canal measures 13 mm thickness in the lower uterine segment, abnormal in a postmenopausal female. Pelvic ultrasound recommended to further evaluate. 4. 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 No acute bony abnormality identified in the lumbar spine. Please see report for lumbar spine CT performed the same time and dictated separately. 5. Bilateral sacral decubitus ulcers. 6.  Aortic Atherosclerois (ICD10-170.0) Electronically Signed   By: Misty Stanley M.D.   On: 02/22/2022 13:50   CT L-SPINE NO CHARGE  Result Date: 02/22/2022 CLINICAL DATA:  Fall out of bed. Dialysis patient, patient states was left in the floor for 3 hours. EXAM: CT LUMBAR SPINE WITHOUT CONTRAST TECHNIQUE: Multidetector CT imaging of the lumbar spine was performed without intravenous contrast administration. Multiplanar CT image reconstructions were  also generated. RADIATION DOSE REDUCTION: This exam was performed according to the departmental dose-optimization program which includes automated exposure control, adjustment of the mA and/or kV according to patient size and/or use of iterative reconstruction technique. COMPARISON:  None Available. FINDINGS: Segmentation: 5 lumbar type vertebrae. Alignment: Straightening of the lumbar spine. Grade 1 anterolisthesis of L5 with bilateral spondylolysis. Vertebrae: No acute fracture or focal pathologic process. Paraspinal and other soft tissues: Marked subcutaneous soft tissue edema and skin irregularity about the lower lumbar/pelvic area consistent with history of known decubitus ulcers. No appreciable fluid collection. Disc levels: T12-L1: No significant finding. L1-L2: No significant finding. L2-L3: Mild disc bulge without significant spinal canal or neural foraminal stenosis. L3-L4: Asymmetric disc bulge to the right with mild left lateral recess stenosis. No significant neural foraminal or spinal canal stenosis. L4-L5: Moderate bilateral lateral recess stenosis and mild spinal canal stenosis. Mild bilateral facet joint arthropathy. L5-S1: Moderate bilateral facet joint arthropathy. No significant spinal canal or neural foraminal stenosis. IMPRESSION: 1.  No acute fracture or traumatic subluxation. 2.  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 3. Skin thickening and deep skin wound in the low lumbar/sacral area consistent with patient's known history of decubitus ulcers. Clinical correlation is suggested. Electronically Signed   By: Keane Police D.O.   On: 02/22/2022 13:43   CT HEAD WO CONTRAST (5MM)  Result Date: 02/22/2022 CLINICAL DATA:  Fall with head and neck trauma. EXAM: CT HEAD WITHOUT CONTRAST CT CERVICAL SPINE WITHOUT CONTRAST TECHNIQUE: Multidetector CT imaging of the head and cervical spine was performed following the standard protocol without intravenous contrast. Multiplanar CT  image reconstructions of the cervical spine were also generated. RADIATION DOSE REDUCTION: This exam was performed according to the departmental dose-optimization program which includes automated exposure control, adjustment of the mA and/or kV according to patient size and/or use of iterative reconstruction technique. COMPARISON:  12/18/2021 FINDINGS: CT HEAD FINDINGS Brain: Ventricles, cisterns and other CSF spaces are normal. There is mild chronic ischemic microvascular disease. There is no mass, mass effect, shift of midline structures or acute hemorrhage. No evidence of acute infarction. Vascular: No hyperdense vessel or unexpected calcification. Skull: Normal. Negative for fracture or focal lesion. Sinuses/Orbits: No acute finding. Other: None. CT CERVICAL SPINE FINDINGS Alignment: Normal. Skull base and vertebrae: Vertebral body heights are maintained. There is mild spondylosis throughout the cervical spine to include facet arthropathy and uncovertebral joint spurring. Bilateral neural foraminal narrowing at the C4-5 level and C5-6 levels. No acute fracture. Atlantoaxial articulation is unremarkable. Soft tissues and spinal canal: Prevertebral soft tissues are normal. No canal stenosis. Disc levels:  Disc space narrowing at the C4-5 and C5-6 levels. Upper chest: No acute findings. Other: None. IMPRESSION: 1.  No acute brain injury. 2. Mild chronic ischemic microvascular disease. 3. No acute cervical spine injury. 4. Mild spondylosis of the cervical spine with disc disease at the C4-5 and C5-6 levels. Bilateral neural foraminal narrowing at the C4-5 and C5-6 levels. Electronically Signed   By: Marin Olp M.D.   On: 02/22/2022 13:38   CT Cervical Spine Wo Contrast  Result Date: 02/22/2022 CLINICAL DATA:  Fall with head and neck trauma. EXAM: CT HEAD WITHOUT CONTRAST CT CERVICAL SPINE WITHOUT CONTRAST TECHNIQUE: Multidetector CT imaging of the head and cervical spine was performed following the standard  protocol without intravenous contrast. Multiplanar CT image reconstructions of the cervical spine were also generated. RADIATION DOSE REDUCTION: This exam was performed according to the departmental dose-optimization program which includes automated exposure control, adjustment of the mA and/or kV according to patient size and/or use of iterative reconstruction technique. COMPARISON:  12/18/2021 FINDINGS: CT HEAD FINDINGS Brain: Ventricles, cisterns and other CSF spaces are normal. There is mild chronic ischemic microvascular disease. There is no mass, mass effect, shift of midline structures or acute hemorrhage. No evidence of acute infarction. Vascular: No hyperdense vessel or unexpected calcification. Skull: Normal. Negative for fracture or focal lesion. Sinuses/Orbits: No acute finding. Other: None. CT CERVICAL SPINE FINDINGS Alignment: Normal. Skull base and vertebrae: Vertebral body heights are maintained. There is mild spondylosis throughout the cervical spine to include facet arthropathy and uncovertebral joint spurring. Bilateral neural foraminal narrowing at the C4-5 level and C5-6 levels. No acute fracture. Atlantoaxial articulation is unremarkable. Soft tissues and spinal canal: Prevertebral soft tissues are normal. No canal stenosis. Disc levels:  Disc space narrowing at the C4-5 and C5-6 levels. Upper chest: No acute findings. Other: None. IMPRESSION: 1. No acute brain injury. 2. Mild chronic ischemic microvascular disease. 3. No acute cervical spine injury. 4. Mild spondylosis of the cervical spine with disc disease at the C4-5 and C5-6 levels. Bilateral neural foraminal narrowing at the C4-5 and C5-6 levels. Electronically Signed   By: Marin Olp M.D.   On: 02/22/2022 13:38   DG Chest Portable 1 View  Result Date: 02/22/2022 CLINICAL DATA:  Fall out of bed this morning. EXAM: PORTABLE CHEST 1 VIEW COMPARISON:  02/07/2022 FINDINGS: Right IJ venous catheter unchanged. Lungs are adequately  inflated with mild hazy prominence of the right perihilar vessels likely mild asymmetric vascular congestion. No airspace consolidation or effusion. No pneumothorax. Cardiomediastinal silhouette and remainder of the exam is unchanged. No acute fracture visualized. IMPRESSION: Suggestion of mild asymmetric vascular congestion. Electronically Signed   By: Marin Olp M.D.   On: 02/22/2022 10:38        Scheduled Meds:  atorvastatin  40 mg Oral Daily   Chlorhexidine Gluconate Cloth  6 each Topical Daily   gabapentin  200 mg Oral TID   heparin injection (subcutaneous)  5,000 Units Subcutaneous Q8H   insulin aspart  0-6 Units Subcutaneous TID WC   midodrine  10 mg Oral BID WC   senna-docusate  1 tablet Oral QHS   vancomycin variable dose per unstable renal function (pharmacist dosing)   Does not apply See admin instructions   Continuous Infusions:  cefTRIAXone (ROCEPHIN)  IV 2 g (02/23/22 1011)   metronidazole 500 mg (02/23/22 0207)     LOS: 1 day    Time spent:     Irine Seal, MD [facility]   To contact the attending provider between 7A-7P or the covering provider during after hours 7P-7A, please log into the web site  [email or web address] and access using universal [place] password for that web site. If you do not have the password, please call the hospital operator.  02/23/2022, 10:53 AM

## 2022-02-23 NOTE — Consult Note (Signed)
[facility] SURGICAL CONSULTATION NOTE (initial) - cpt: [phone]   HISTORY OF PRESENT ILLNESS (HPI):  66 y.o. female who is known to our service following lengthy admission in 05/31 - 07/11 for failure to thrive. During that time, she underwent debridement of unstageable bilateral buttock wounds with Dr Hampton Abbot. She had a wound vac placed. Ultimately, these were changed to three times weekly dressing changes with betadine, and this proved to work well. 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 Skin biopsy was taken and negative. Unfortunately, she has poor vascular status to her lower extremity and wound healing is quite challenging. She was discharged to [facility] on 07/11. Unfortunately, she represents to [facility] on 07/23 secondary to fall at [facility]. She reported had a mechanical fall in which she slipped off her bed onto the ground. She is unable to provide much additional history this morning. Work up in the ED revealed a leukocytosis to 21.5K, sCr - 2.97, lactic acid level was normal at 1.5, Bcx are without growth. She was worked up extensively with imaging which was not overly revealing. She was admitted to the medicine service for suspected sepsis. She was started on Rocephin, Flagyl and Vancomycin in the ED.   Surgery is consulted by emergency medicine  physician Dr. Marjean Donna, MD in this context for evaluation and management of known decubitus ulcerations.  PAST MEDICAL HISTORY (PMH):  Past Medical History:  Diagnosis Date   CKD (chronic kidney disease), stage III ([place])    Diabetes ([place])    Hyperlipidemia    Hypertension    Obesity      PAST SURGICAL HISTORY ([place]):  Past Surgical History:  Procedure Laterality Date   ACHILLES TENDON REPAIR     ANKLE SURGERY     DIALYSIS/PERMA CATHETER INSERTION N/A 01/09/2022   Procedure: DIALYSIS/PERMA CATHETER INSERTION;  Surgeon: Katha Cabal, MD;  Location: [facility];  Service: Cardiovascular;  Laterality: N/A;   INCISION AND DRAINAGE ABSCESS Bilateral 01/01/2022   Procedure: INCISION AND DRAINAGE ABSCESS-Sacral Decubitus;  Surgeon: Olean Ree, MD;  Location: [facility];  Service: General;  Laterality: Bilateral;   URETHRAL STRICTURE DILATATION       MEDICATIONS:  Prior to Admission medications   Medication Sig Start Date End Date Taking? Authorizing Provider  acetaminophen (TYLENOL) 325 MG tablet Take 975 mg by mouth every 6 (six) hours as needed for mild pain.   Yes [provider]  allopurinol (ZYLOPRIM) 100 MG tablet Take 100 mg by mouth daily.   Yes [provider]  ascorbic acid (VITAMIN C) 500 MG tablet Take 1 tablet (500 mg total) by mouth 2 (two) times daily. 02/10/22  Yes Enzo Bi, MD  atorvastatin (LIPITOR) 40 MG tablet Take 40 mg by mouth daily.   Yes [provider]  cetirizine (ZYRTEC) 10 MG tablet Take 10 mg by mouth daily.   Yes [provider]  fluticasone (FLONASE) 50 MCG/ACT nasal spray Place 2 sprays into both nostrils daily. 11/27/14 02/22/22 Yes Mungal, Vishal, MD  gabapentin (NEURONTIN) 100 MG capsule Take 2 capsules (200 mg total) by mouth 3 (three) times daily. 02/10/22  Yes Enzo Bi, MD  insulin glargine-yfgn (SEMGLEE) 100 UNIT/ML injection Inject 0.15 mLs (15 Units total) into the skin at bedtime. 02/10/22  Yes Enzo Bi, MD  insulin lispro (HUMALOG) 100 UNIT/ML injection Inject 3 Units into the skin 4 (four) times daily -  before meals and at bedtime.  Yes [provider]  metoprolol tartrate (LOPRESSOR) 25 MG tablet Take 1 tablet (25 mg total) by mouth 2 (two) times daily. 12/23/21  Yes Sharen Hones, MD  multivitamin (RENA-VIT) TABS tablet Take 1 tablet by mouth at bedtime. 02/10/22  Yes Enzo Bi, MD  nystatin (MYCOSTATIN/NYSTOP) powder Apply topically. 10/28/21  Yes [provider]  Omega-3 Fatty Acids (FISH OIL) 500 MG CAPS Take by mouth.   Yes [provider]  oxyCODONE (OXY IR/ROXICODONE) 5 MG immediate release tablet Take 1 tablet (5 mg total) by mouth every 6 (six) hours as needed for moderate pain or severe pain (can give 10 mg 30 minutes before dressing change). 02/10/22  Yes Enzo Bi, MD  polyethylene glycol (MIRALAX / GLYCOLAX) 17 g packet Take 17 g by mouth daily. 02/11/22  Yes Enzo Bi, MD  senna-docusate (SENOKOT-S) 8.6-50 MG tablet Take 1 tablet by mouth 2 (two) times daily. 02/10/22  Yes Enzo Bi, MD  Ensure Max Protein (ENSURE MAX PROTEIN) LIQD Take 330 mLs (11 oz total) by mouth 2 (two) times daily. 02/10/22   Enzo Bi, MD  epoetin alfa (EPOGEN) 10000 UNIT/ML injection Inject 1 mL (10,000 Units total) into the vein every Monday, Wednesday, and Friday with hemodialysis. 02/11/22   Enzo Bi, MD  fexofenadine (ALLEGRA) 180 MG tablet Take 180 mg by mouth daily. Patient not taking: Reported on 02/22/2022    [provider]  insulin aspart (NOVOLOG) 100 UNIT/ML injection Inject 3 Units into the skin 3 (three) times daily with meals. Patient not taking: Reported on 02/22/2022 02/10/22   Enzo Bi, MD  nutrition supplement, JUVEN, Fanny Dance) PACK Take 1 packet by mouth 2 (two) times daily between meals. 02/10/22   Enzo Bi, MD  sodium chloride 0.9 % SOLN 100 mL with sodium thiosulfate 250 MG/ML SOLN 25 g Inject 25 g into the vein every Monday, Wednesday, and Friday with hemodialysis. 02/10/22   Enzo Bi, MD     ALLERGIES:  Allergies  Allergen Reactions   Codeine Nausea And Vomiting    Other reaction(s): nausea and vomiting, vomiting   Penicillins Rash and Hives    Other reaction(s): systemic rash   Carvedilol     Other reaction(s): high sugar, headache, cough   Fluoxetine     Other reaction(s): took in 1990 . made her worse.   Liraglutide     Other reaction(s): too nauseated.   Pioglitazone     Other reaction(s): edema and osteoporosis   Sulfa Antibiotics Swelling   Clindamycin Hcl Rash   Clindamycin/Lincomycin Rash    Dilaudid [Hydromorphone Hcl] Rash     SOCIAL HISTORY:  Social History   Socioeconomic History   Marital status: Divorced    Spouse name: Not on file   Number of children: Not on file   Years of education: Not on file   Highest education level: Not on file  Occupational History   Occupation: Works [employer]  Tobacco Use   Smoking status: Never   Smokeless tobacco: Not on file  Substance and Sexual Activity   Alcohol use: Not Currently   Drug use: Not Currently   Sexual activity: Not Currently  Other Topics Concern   Not on file  Social History Narrative   Lives alone   Social Determinants of Health   Financial Resource Strain: Not on file  Food Insecurity: Not on file  Transportation Needs: Not on file  Physical Activity: Not on file  Stress: Not on file  Social Connections: Not on  file  Intimate Partner Violence: Not on file     FAMILY HISTORY:  Family History  Problem Relation Age of Onset   Alzheimer's disease Father    Diabetes Father    CAD Father 34   CAD Other        Multliple maternal aunts and uncles with early onset heart disease   Lung cancer Sister    ALS Mother       REVIEW OF SYSTEMS:  Review of Systems  Unable to perform ROS: Other (Poor Historian)  Musculoskeletal:  Positive for falls.  Skin:        + Wounds    VITAL SIGNS:  Temp:  [97.8 F (36.6 C)-98.9 F (37.2 C)] 98.2 F (36.8 C) (07/24 0735) Pulse Rate:  [72-108] 95 (07/24 0735) Resp:  [13-21] 15 (07/24 0735) BP: (103-135)/(43-80) 122/71 (07/24 0735) SpO2:  [94 %-100 %] 100 % (07/24 0735)     Height: 5\' 2"  (157.5 cm) Weight: 113.4 kg BMI (Calculated): 45.71   INTAKE/OUTPUT:  07/23 0701 - 07/24 0700 In: 100 [IV Piggyback:100] Out: 0   PHYSICAL EXAM:  Physical Exam Vitals and nursing note reviewed. Exam conducted with a chaperone present.  Constitutional:      General: She is not in acute distress.    Appearance: Normal appearance. She is obese. She is not  ill-appearing.  HENT:     Head: Normocephalic and atraumatic.  Eyes:     General: No scleral icterus.    Conjunctiva/sclera: Conjunctivae normal.  Cardiovascular:     Rate and Rhythm: Normal rate.     Pulses: Normal pulses.     Heart sounds: No murmur heard. Pulmonary:     Effort: Pulmonary effort is normal. No respiratory distress.  Genitourinary:    Comments: Deferred Musculoskeletal:     Right lower leg: Edema present.     Left lower leg: Edema present.  Skin:    General: Skin is warm and dry.     Findings: Wound present.     Comments: Multiple wounds as below:  Right Lateral Hip: 23 X 8 cm, this is dry eschar, no evidence of infection nor necrosis  Right Lateral Thigh: 7 x 7 cm, this is dry eschar, no evidence of infection nor necrosis  Left Anterior Hip: 9 x 4 cm, this is dry eschar, no evidence of infection nor necrosis  Gluteal Wounds: Healing well, some fibrinous exudate, no infection   Let Great Trochanter: 18 x 8 x 2 cm, eschar with some fibrinous tissue...sharply debrided at bedside, no gross evidence of necrosis   Neurological:     General: No focal deficit present.     Mental Status: She is alert. Mental status is at baseline.  Psychiatric:        Mood and Affect: Mood normal.        Behavior: Behavior normal.    Left Trochanteric Wound (02/23/2022):    Bilateral Gluteal Wounds (02/23/2022):    Right Lateral Hip (02/23/2022):       Labs:     Latest Ref Rng & Units 02/23/2022    7:40 AM 02/22/2022    9:30 AM 02/09/2022   12:10 PM  CBC  WBC 4.0 - 10.5 K/uL 20.8  21.5  13.9   Hemoglobin 12.0 - 15.0 g/dL 7.1  7.9  8.2   Hematocrit 36.0 - 46.0 % 24.0  26.6  26.3   Platelets 150 - 400 K/uL 346  447  282       Latest Ref  Rng & Units 02/23/2022    4:17 AM 02/22/2022    9:30 AM 02/09/2022   12:10 PM  CMP  Glucose 70 - 99 mg/dL 110  242  144   BUN 8 - 23 mg/dL 27  23  59   Creatinine 0.44 - 1.00 mg/dL 3.41  2.97  4.58   Sodium 135 - 145 mmol/L 136   138  137   Potassium 3.5 - 5.1 mmol/L 4.5  4.3  4.4   Chloride 98 - 111 mmol/L 102  100  97   CO2 22 - 32 mmol/L 20  23  22    Calcium 8.9 - 10.3 mg/dL 8.3  9.0  10.0   Total Protein 6.5 - 8.1 g/dL 5.2  6.7    Total Bilirubin 0.3 - 1.2 mg/dL 1.0  1.1    Alkaline Phos 38 - 126 U/L 145  190    AST 15 - 41 U/L 18  23    ALT 0 - 44 U/L 15  20      Assessment/Plan: (ICD-10's: S31.809A) 66 y.o. female with admitted with suspected sepsis thought to be secondary to known sacral wounds; however, there is no evidence of acute infection nor necrosis on examination, complicated by significant deconditioning, malnutrition, immobility, and multiple comorbid diseases    - Fortunately, her wounds are stable to improved throughout. There is dry eschar on bilateral hip wounds, but we have elected to forgo wound debridement as there is no evidence of infection nor necrosis. She has proven that she has an extremely hard time without healing. Otherwise, her gluteal wounds are improved. There was a small amount of fibrinous tissue which Dr Hampton Abbot was able to clean up at bedside. I do not feel these wounds are a source of sepsis and she does not warrant debridement in the OR.     - Wound Care: Continue 3x weekly dressing changes with betadine soaked gauze, cover, secure. This can be changes as needed as well.   - Pressure off-loading, frequent repositioning, low air loss mattress. Discussed case with [place] RN; appreciate assistance with orders  - Okay for diet from surgical perspective  - Abx per primary team  - Pain control prn  - Further management per primary service   - General surgery will sign off. No needs for debridement. Wound care as above. We will be available if needed.    All of the above findings and recommendations were discussed with the patient, and all of patient's questions were answered to her expressed satisfaction.  Thank you for the opportunity to participate in this patient's care.    -- Edison Simon, PA-C [facility] 02/23/2022, 10:15 AM M-F: 7am - 4pm

## 2022-02-23 NOTE — Evaluation (Signed)
Occupational Therapy Evaluation Patient Details Name: Theresa Warren MRN: [id number] DOB: 1956/02/19 Today's Date: 02/23/2022   History of Present Illness Pt is a 66 y.o. female from [place] who comes in with a fall.  Patient reports that she had mechanical fall out of bed where she slipped off the air mattress and was on the ground for 3 hours where she could not get up. MD assessment includes: sepsis secondary to necrotic sacral decubitus wounds, anemia, and physical deconditioning with fall.  PMH significant for chronic anemia. hyperlipidemia, hypertension, CKD stage V, insulin-dependent diabetes mellitus.   Clinical Impression   Pt was seen for OT evaluation and co-tx with PT this date. Prior to hospital admission, pt was at [facility] after recent hospitalization and reports falling trying to get up on her own. Prior to previous admissions, pt was living by herself and denies any outside support systems to fall back on. Currently pt demonstrates impairments as described below (See OT problem list) which functionally limit her ability to perform ADL/self-care tasks. Pt currently requires  MIN  A+2 for sup>sit, MAX A +2 for STS with RW and for sit>supine. Pt attempted lateral scoots EOB and was able to achieve scooting ~1-2" EOB before too tired to continue. Pt educated in PLB and distraction techniques to support improved pain control. Encouragement provided to participate. Pt would benefit from skilled OT services to address noted impairments and functional limitations (see below for any additional details) in order to maximize safety and independence while minimizing falls risk and caregiver burden. Upon hospital discharge, recommend STR to maximize pt safety and return to PLOF.     Recommendations for follow up therapy are one component of a multi-disciplinary discharge planning process, led by the attending physician.  Recommendations may be updated based on patient status,  additional functional criteria and insurance authorization.   Follow Up Recommendations  Skilled nursing-short term rehab (<3 hours/day)    Assistance Recommended at Discharge Frequent or constant Supervision/Assistance  Patient can return home with the following A lot of help with bathing/dressing/bathroom;Assist for transportation;Help with stairs or ramp for entrance;Two people to help with walking and/or transfers    Functional Status Assessment  Patient has had a recent decline in their functional status and demonstrates the ability to make significant improvements in function in a reasonable and predictable amount of time.  Equipment Recommendations  Other (comment) (defer to next venue)    Recommendations for Other Services       Precautions / Restrictions Precautions Precautions: Fall Precaution Comments: sacral & leg wounds Restrictions Weight Bearing Restrictions: No Other Position/Activity Restrictions: History of falling with staff present, careful      Mobility Bed Mobility Overal bed mobility: Needs Assistance Bed Mobility: Sit to Supine, Supine to Sit     Supine to sit: +2 for physical assistance, Min assist Sit to supine: +2 for physical assistance, Max assist   General bed mobility comments: Extra time and cuing for sequencing needed along with encouragement for effort    Transfers Overall transfer level: Needs assistance Equipment used: Rolling walker (2 wheels) Transfers: Sit to/from Stand, Bed to chair/wheelchair/BSC Sit to Stand: +2 physical assistance, Mod assist          Lateral/Scoot Transfers: Supervision General transfer comment: Pt able to clear the surface of the bed with +2 Mod A during sit to stand transfer training but unable to come to full upright position; pt able to laterally scoot at the EOB around 2-3 inches with poor clearance;  heavy cuing for sequencing with all transfer training      Balance Overall balance assessment: Needs  assistance, History of Falls Sitting-balance support: Feet supported, Bilateral upper extremity supported Sitting balance-Leahy Scale: Fair     Standing balance support: Bilateral upper extremity supported, Reliant on assistive device for balance Standing balance-Leahy Scale: Poor                             ADL either performed or assessed with clinical judgement   ADL Overall ADL's : Needs assistance/impaired                                       General ADL Comments: In sitting pt requires setup and supv for grooming, MIN A for UB ADL, MAX A for LB ADL from seated position     Vision         Perception     Praxis      Pertinent Vitals/Pain Pain Assessment Pain Assessment: 0-10 Pain Score: 8  Pain Location: BLE thighs and groin area Pain Descriptors / Indicators: Grimacing, Guarding, Moaning Pain Intervention(s): Monitored during session, Premedicated before session, Repositioned     Hand Dominance     Extremity/Trunk Assessment Upper Extremity Assessment Upper Extremity Assessment: Generalized weakness   Lower Extremity Assessment Lower Extremity Assessment: Generalized weakness       Communication Communication Communication: No difficulties   Cognition Arousal/Alertness: Awake/alert Behavior During Therapy: Anxious Overall Cognitive Status: No family/caregiver present to determine baseline cognitive functioning                                 General Comments: anxious, fearful of falling, needs a lot of encouragement, extra time needed occasionally with word finding     General Comments       Exercises Other Exercises Other Exercises: Pt instructed in pursed lip breathing and cog beh pain coping strategies to support improved pain mgt including distraction techniques   Shoulder Instructions      Home Living Family/patient expects to be discharged to:: Skilled nursing facility Living Arrangements:  Alone Available Help at Discharge: Other (Comment) (No support) Type of Home: House Home Access: Stairs to enter [employer] of Steps: 4-5 Entrance Stairs-Rails: Right Home Layout: Two level;Able to live on main level with bedroom/bathroom     Bathroom Shower/Tub: Occupational psychologist: Standard     Home Equipment: Kasandra Knudsen - single point   Additional Comments: bought toilet rails but hasn't been able to use them yet, has a 2WW that was her mother's that is in storage but hadn't needed it prior to multiple recent admissions and SNF stays      Prior Functioning/Environment Prior Level of Function : History of Falls (last six months)             Mobility Comments: At rehab, was able to sit EOB with therapy assist, stood once or twice with a tech ADLs Comments: At rehab, needed some help with ADL        OT Problem List: Decreased strength;Decreased range of motion;Decreased activity tolerance;Impaired balance (sitting and/or standing);Decreased safety awareness;Pain;Obesity      OT Treatment/Interventions: Self-care/ADL training;Therapeutic exercise;Energy conservation;DME and/or AE instruction;Therapeutic activities;Patient/family education;Balance training    OT Goals(Current goals can be found in the care  plan section) Acute Rehab OT Goals Patient Stated Goal: get well enough to go back home OT Goal Formulation: With patient Time For Goal Achievement: 03/09/22 Potential to Achieve Goals: Fair ADL Goals Pt Will Perform Lower Body Dressing: with adaptive equipment;sitting/lateral leans;with min assist Pt Will Transfer to Toilet: stand pivot transfer;bedside commode;with mod assist Pt Will Perform Toileting - Clothing Manipulation and hygiene: with supervision;with set-up;sitting/lateral leans Additional ADL Goal #1: Pt will complete bed mobility with MIN A in preparation for EOB/OOB ADL Additional ADL Goal #2: Pt will complete HEP with handout and mod  indep to increased BUE strength needed for ADL transfers  OT Frequency: Min 2X/week    Co-evaluation PT/OT/SLP Co-Evaluation/Treatment: Yes Reason for Co-Treatment: Complexity of the patient's impairments (multi-system involvement);Necessary to address cognition/behavior during functional activity;For patient/therapist safety;To address functional/ADL transfers PT goals addressed during session: Mobility/safety with mobility;Strengthening/ROM OT goals addressed during session: ADL's and self-care;Proper use of Adaptive equipment and DME      AM-PAC OT "6 Clicks" Daily Activity     Outcome Measure Help from another person eating meals?: None Help from another person taking care of personal grooming?: A Little Help from another person toileting, which includes using toliet, bedpan, or urinal?: A Lot Help from another person bathing (including washing, rinsing, drying)?: A Lot Help from another person to put on and taking off regular upper body clothing?: A Little Help from another person to put on and taking off regular lower body clothing?: A Lot 6 Click Score: 16   End of Session Equipment Utilized During Treatment: Gait belt;Rolling walker (2 wheels)  Activity Tolerance: Patient tolerated treatment well Patient left: in bed;with call bell/phone within reach;with bed alarm set  OT Visit Diagnosis: Other abnormalities of gait and mobility (R26.89);Muscle weakness (generalized) (M62.81);Pain Pain - part of body:  (back)                Time: 8937-3428 OT Time Calculation (min): 44 min Charges:  OT General Charges $OT Visit: 1 Visit OT Evaluation $OT Eval Moderate Complexity: 1 Mod OT Treatments $Therapeutic Activity: 8-22 mins  Ardeth Perfect., MPH, MS, OTR/L ascom [phone] 02/23/22, 12:45 PM

## 2022-02-23 NOTE — Plan of Care (Signed)

## 2022-02-23 NOTE — NC FL2 (Signed)
[place] LEVEL OF CARE SCREENING TOOL     IDENTIFICATION  Patient Name: Theresa Warren Birthdate: 11-16-1955 Sex: female Admission Date (Current Location): 02/22/2022  [place] and [place] Number:  Engineering geologist and Address:  [facility], [address] [place], [place] [address]      Provider Number: [id number]  Attending Physician Name and Address:  Eugenie Filler, MD  Relative Name and Phone Number:       Current Level of Care: Hospital Recommended Level of Care: [place] Prior Approval Number:    Date Approved/Denied:   PASRR Number: [id number]  Discharge Plan: SNF    Current Diagnoses: Patient Active Problem List   Diagnosis Date Noted   Wound of buttock    Bilateral lower extremity edema    Anemia due to chronic kidney disease, on chronic dialysis ([place])    Hypertension    Sepsis ([place]) 02/22/2022   ESRD (end stage renal disease) ([place])    Calciphylaxis    Anasarca associated with disorder of kidney 01/05/2022   Premature atrial complexes 01/02/2022   Sacral wound, initial encounter 01/01/2022   Swelling of left lower extremity 01/01/2022   Abdominal pain 01/01/2022   AKI (acute kidney injury) ([place]) 01/01/2022   Hyponatremia 01/01/2022   Symptomatic anemia 12/31/2021   Elevated LFTs 12/31/2021   Severe sepsis ([place]) 12/31/2021   Hypokalemia 12/21/2021   Pressure injury of skin 12/20/2021   Hypomagnesemia 12/19/2021   Acute blood loss anemia 12/19/2021   Abnormal vaginal bleeding 12/19/2021   Uncontrolled type 2 diabetes mellitus with hyperglycemia, with long-term current use of insulin ([place]) 12/19/2021   Chronic kidney disease, stage 5 ([place]) 12/18/2021   Chronic pain syndrome 12/18/2021   Fall at home 12/18/2021   DNR (do not resuscitate)/DNI(Do Not Intubate) 12/18/2021   Type 2 diabetes mellitus with chronic kidney disease, with long-term current use of insulin ([place]) 12/18/2021    Anemia of renal disease 12/18/2021   Long term (current) use of insulin ([place]) 08/30/2018   OSA (obstructive sleep apnea) 11/27/2014   Sleep disturbance 10/10/2014   Severe obesity (BMI >= 40) ([place]) 10/10/2014   Palpitation 09/11/2014   Dyspnea 09/11/2014   Morbid (severe) obesity due to excess calories ([place]) 07/01/2010   Essential hypertension 06/06/2009    Orientation RESPIRATION BLADDER Height & Weight     Self, Time, Situation, Place  Normal Continent Weight: 250 lb (113.4 kg) Height:  5\' 2"  (157.5 cm)  BEHAVIORAL SYMPTOMS/MOOD NEUROLOGICAL BOWEL NUTRITION STATUS      Incontinent Diet (Diet renal with fluid restriction Fluid restriction: 1200 mL Fluid)  AMBULATORY STATUS COMMUNICATION OF NEEDS Skin   Extensive Assist Verbally PU Stage and Appropriate Care (Pressure Ulcer on left and right hip- unstageable- foam dressing, Pressure Ulcer on left and right thigh- unstageable- foam dressing, open wound on left tibial)     PU Stage 3 Dressing:  (buttocks, foam dressing) PU Stage 4 Dressing:  (buttocks, foam dressing)               Personal Care Assistance Level of Assistance  Dressing, Feeding, Bathing Bathing Assistance: Maximum assistance Feeding assistance: Independent Dressing Assistance: Maximum assistance     Functional Limitations Info  Sight, Hearing, Speech Sight Info: Adequate Hearing Info: Adequate Speech Info: Adequate    SPECIAL CARE FACTORS FREQUENCY  PT (By licensed PT), OT (By licensed OT)     PT Frequency: 5x OT Frequency: 5x  Contractures Contractures Info: Not present    Additional Factors Info  Code Status, Allergies Code Status Info: DNR Allergies Info: Codeine   Penicillins   Carvedilol   Fluoxetine   Liraglutide   Pioglitazone   Sulfa Antibiotics   Clindamycin Hcl   Clindamycin/lincomycin   Dilaudid (Hydromorphone Hcl)           Current Medications (02/23/2022):  This is the current hospital active medication list Current  Facility-Administered Medications  Medication Dose Route Frequency Provider Last Rate Last Admin   acetaminophen (TYLENOL) tablet 650 mg  650 mg Oral Q6H PRN [place] Crane, Carole N, DO       atorvastatin (LIPITOR) tablet 40 mg  40 mg Oral Daily Correll, Carole N, DO   40 mg at 02/23/22 1007   cefTRIAXone (ROCEPHIN) 2 g in sodium chloride 0.9 % 100 mL IVPB  2 g Intravenous Q24H Hall, Carole N, DO 200 mL/hr at 02/23/22 1011 2 g at 02/23/22 1011   Chlorhexidine Gluconate Cloth 2 % PADS 6 each  6 each Topical Daily Kayleen Memos, DO   6 each at 02/23/22 1008   gabapentin (NEURONTIN) capsule 200 mg  200 mg Oral TID Irene Pap N, DO   200 mg at 02/23/22 1006   heparin injection 5,000 Units  5,000 Units Subcutaneous Q8H Hall, Carole N, DO   5,000 Units at 02/23/22 1008   HYDROmorphone (DILAUDID) injection 1 mg  1 mg Intravenous Q4H PRN Eugenie Filler, MD       insulin aspart (novoLOG) injection 0-6 Units  0-6 Units Subcutaneous TID WC Eugenie Filler, MD   1 Units at 02/23/22 1156   metroNIDAZOLE (FLAGYL) IVPB 500 mg  500 mg Intravenous Q12H Irene Pap N, DO 100 mL/hr at 02/23/22 0207 500 mg at 02/23/22 0207   midodrine (PROAMATINE) tablet 10 mg  10 mg Oral BID WC Irene Pap N, DO   10 mg at 02/23/22 1007   oxyCODONE (Oxy IR/ROXICODONE) immediate release tablet 5 mg  5 mg Oral Q4H PRN Irene Pap N, DO   5 mg at 02/23/22 1155   polyethylene glycol (MIRALAX / GLYCOLAX) packet 17 g  17 g Oral Daily PRN Irene Pap N, DO       senna-docusate (Senokot-S) tablet 1 tablet  1 tablet Oral QHS Irene Pap N, DO   1 tablet at 02/22/22 2129   vancomycin variable dose per unstable renal function (pharmacist dosing)   Does not apply See admin instructions Kayleen Memos, DO         Discharge Medications: Please see discharge summary for a list of discharge medications.  Relevant Imaging Results:  Relevant Lab Results:   Additional Information SSN: [id number]  Eileen Stanford, LCSW

## 2022-02-23 NOTE — Progress Notes (Signed)
Initial Nutrition Assessment  DOCUMENTATION CODES:   Obesity unspecified  INTERVENTION:   -Renal MVI daily -500 mg vitamin C BID -30 ml Prosource Plus TID, each supplement provides 100 kcals and 15 grams protein -1 packet Juven BID, each packet provides 95 calories, 2.5 grams of protein (collagen), and 9.8 grams of carbohydrate (3 grams sugar); also contains 7 grams of L-arginine and L-glutamine, 300 mg vitamin C, 15 mg vitamin E, 1.2 mcg vitamin B-12, 9.5 mg zinc, 200 mg calcium, and 1.5 g  Calcium Beta-hydroxy-Beta-methylbutyrate to support wound healing  -Double protein portions with meals -Monitor labs: vitamin C, zinc, and copper  NUTRITION DIAGNOSIS:   Increased nutrient needs related to wound healing as evidenced by estimated needs.  GOAL:   Patient will meet greater than or equal to 90% of their needs  MONITOR:   PO intake, Supplement acceptance  REASON FOR ASSESSMENT:   Consult Assessment of nutrition requirement/status, Wound healing  ASSESSMENT:   Pt with medical history significant for severe morbid obesity, deconditioning, nonambulatory, painful necrotic lesions with suspected calciphylaxis, chronic bilateral sacral decubitus wounds, ESRD on HD TTS, anemia of chronic disease, essential hypertension, type 2 diabetes, who was recently admitted for hyponatremia.  Pt admitted with sepsis secondary to necrotic sacral decubitus wounds.   Reviewed I/O's: +100 ml x 24 hours  Pt unavailable at time of visit. Attempted to speak with pt via call to hospital room phone, however, unable to reach. RD unable to obtain further nutrition-related history or complete nutrition-focused physical exam at this time.    Per general surgery notes, no plans further debridements planned and will continue dressings per CWOCN.   Pt familiar to this RD from recent prior admission. Seh takes supplements well.   Pt currently on a renal diet. Noted meal completions 100%.   Medications  reviewed and include senokot.   Lab Results  Component Value Date   HGBA1C 10.0 (H) 12/18/2021   PTA DM medications are 15 units insulin glargine-yfgn daily and 3 units insulin lispro 4 times daily.   Labs reviewed: CBGS: 108-161 (inpatient orders for glycemic control are 0-6 units insulin aspart TID with meals).    Diet Order:   Diet Order             Diet renal with fluid restriction Fluid restriction: 1200 mL Fluid; Room service appropriate? Yes; Fluid consistency: Thin  Diet effective now                   EDUCATION NEEDS:   No education needs have been identified at this time  Skin:  Skin Assessment: Skin Integrity Issues: Skin Integrity Issues:: Unstageable DTI: - Stage II: - Stage III: lt buttocks Stage IV: rt and lt buttocks Unstageable: lt hip, rt hip, lt upper thigh, lt posterior thigh, rt lateral thigh Wound Vac: - Other: non-pressure wound to lt tibia  Last BM:  02/23/22  Height:   Ht Readings from Last 1 Encounters:  02/22/22 5\' 2"  (1.575 m)    Weight:   Wt Readings from Last 1 Encounters:  02/22/22 113.4 kg    Ideal Body Weight:  50 kg  BMI:  Body mass index is 45.73 kg/m.  Estimated Nutritional Needs:   Kcal:  1800-2000  Protein:  110-125 grams  Fluid:  1000 ml + UOP    Loistine Chance, RD, LDN, [place] Registered Dietitian II Certified Diabetes Care and Education Specialist Please refer to [facility] for RD and/or RD on-call/weekend/after hours pager

## 2022-02-24 ENCOUNTER — Inpatient Hospital Stay: Payer: Medicare Other

## 2022-02-24 DIAGNOSIS — R600 Localized edema: Secondary | ICD-10-CM | POA: Diagnosis not present

## 2022-02-24 DIAGNOSIS — A419 Sepsis, unspecified organism: Secondary | ICD-10-CM | POA: Diagnosis not present

## 2022-02-24 DIAGNOSIS — S31809A Unspecified open wound of unspecified buttock, initial encounter: Secondary | ICD-10-CM | POA: Diagnosis not present

## 2022-02-24 DIAGNOSIS — N186 End stage renal disease: Secondary | ICD-10-CM | POA: Diagnosis not present

## 2022-02-24 LAB — CBC WITH DIFFERENTIAL/PLATELET
Abs Immature Granulocytes: 0.18 10*3/uL — ABNORMAL HIGH (ref 0.00–0.07)
Basophils Absolute: 0.1 10*3/uL (ref 0.0–0.1)
Basophils Relative: 1 %
Eosinophils Absolute: 0.4 10*3/uL (ref 0.0–0.5)
Eosinophils Relative: 3 %
HCT: 21.1 % — ABNORMAL LOW (ref 36.0–46.0)
Hemoglobin: 6.6 g/dL — ABNORMAL LOW (ref 12.0–15.0)
Immature Granulocytes: 1 %
Lymphocytes Relative: 12 %
Lymphs Abs: 1.8 10*3/uL (ref 0.7–4.0)
MCH: 28.9 pg (ref 26.0–34.0)
MCHC: 31.3 g/dL (ref 30.0–36.0)
MCV: 92.5 fL (ref 80.0–100.0)
Monocytes Absolute: 1 10*3/uL (ref 0.1–1.0)
Monocytes Relative: 7 %
Neutro Abs: 10.9 10*3/uL — ABNORMAL HIGH (ref 1.7–7.7)
Neutrophils Relative %: 76 %
Platelets: 333 10*3/uL (ref 150–400)
RBC: 2.28 MIL/uL — ABNORMAL LOW (ref 3.87–5.11)
RDW: 19 % — ABNORMAL HIGH (ref 11.5–15.5)
WBC: 14.4 10*3/uL — ABNORMAL HIGH (ref 4.0–10.5)
nRBC: 0 % (ref 0.0–0.2)

## 2022-02-24 LAB — RENAL FUNCTION PANEL
Albumin: 1.9 g/dL — ABNORMAL LOW (ref 3.5–5.0)
Anion gap: 12 (ref 5–15)
BUN: 40 mg/dL — ABNORMAL HIGH (ref 8–23)
CO2: 22 mmol/L (ref 22–32)
Calcium: 8.8 mg/dL — ABNORMAL LOW (ref 8.9–10.3)
Chloride: 101 mmol/L (ref 98–111)
Creatinine, Ser: 4.64 mg/dL — ABNORMAL HIGH (ref 0.44–1.00)
GFR, Estimated: 10 mL/min — ABNORMAL LOW (ref >60)
Glucose, Bld: 302 mg/dL — ABNORMAL HIGH (ref 70–99)
Phosphorus: 3.7 mg/dL (ref 2.5–4.6)
Potassium: 4.3 mmol/L (ref 3.5–5.1)
Sodium: 135 mmol/L (ref 135–145)

## 2022-02-24 LAB — HEPATITIS B CORE ANTIBODY, TOTAL: Hep B Core Total Ab: NONREACTIVE

## 2022-02-24 LAB — GLUCOSE, CAPILLARY
Glucose-Capillary: 280 mg/dL — ABNORMAL HIGH (ref 70–99)
Glucose-Capillary: 287 mg/dL — ABNORMAL HIGH (ref 70–99)
Glucose-Capillary: 197 mg/dL — ABNORMAL HIGH (ref 70–99)
Glucose-Capillary: 164 mg/dL — ABNORMAL HIGH (ref 70–99)

## 2022-02-24 LAB — HEPATITIS B SURFACE ANTIBODY,QUALITATIVE: Hep B S Ab: REACTIVE — AB

## 2022-02-24 LAB — HEPATITIS B SURFACE ANTIGEN: Hepatitis B Surface Ag: NONREACTIVE

## 2022-02-24 LAB — PREPARE RBC (CROSSMATCH)

## 2022-02-24 LAB — HEPATITIS C ANTIBODY: HCV Ab: NONREACTIVE

## 2022-02-24 MED ORDER — ACETAMINOPHEN 325 MG PO TABS
650.0000 mg | ORAL_TABLET | Freq: Once | ORAL | Status: AC
  Administered 2022-02-24: 650 mg via ORAL
  Filled 2022-02-24: qty 2

## 2022-02-24 MED ORDER — LIDOCAINE HCL (PF) 1 % IJ SOLN: 5.0000 mL | INTRAMUSCULAR | Status: DC | PRN

## 2022-02-24 MED ORDER — OMEGA-3-ACID ETHYL ESTERS 1 G PO CAPS
1.0000 g | ORAL_CAPSULE | Freq: Every day | ORAL | Status: DC
  Administered 2022-02-24 – 2022-03-02 (×7): 1 g via ORAL
  Filled 2022-02-24 (×7): qty 1

## 2022-02-24 MED ORDER — METOPROLOL TARTRATE 25 MG PO TABS
25.0000 mg | ORAL_TABLET | Freq: Two times a day (BID) | ORAL | Status: DC
  Administered 2022-02-24 – 2022-03-02 (×13): 25 mg via ORAL
  Filled 2022-02-24 (×13): qty 1

## 2022-02-24 MED ORDER — HEPARIN SODIUM (PORCINE) 1000 UNIT/ML IJ SOLN
INTRAMUSCULAR | Status: AC
  Filled 2022-02-24: qty 10

## 2022-02-24 MED ORDER — INSULIN GLARGINE-YFGN 100 UNIT/ML ~~LOC~~ SOLN
15.0000 [IU] | Freq: Every day | SUBCUTANEOUS | Status: DC
  Administered 2022-02-24 – 2022-03-01 (×6): 15 [IU] via SUBCUTANEOUS
  Filled 2022-02-24 (×7): qty 0.15

## 2022-02-24 MED ORDER — SODIUM THIOSULFATE 250 MG/ML IV SOLN
25.0000 g | INTRAVENOUS | Status: DC
  Administered 2022-02-24 – 2022-02-28 (×3): 25 g via INTRAVENOUS
  Filled 2022-02-24 (×3): qty 100

## 2022-02-24 MED ORDER — INSULIN ASPART 100 UNIT/ML IJ SOLN
3.0000 [IU] | Freq: Three times a day (TID) | INTRAMUSCULAR | Status: DC
  Administered 2022-02-24 – 2022-03-02 (×13): 3 [IU] via SUBCUTANEOUS
  Filled 2022-02-24 (×14): qty 1

## 2022-02-24 MED ORDER — ANTICOAGULANT SODIUM CITRATE 4% (200MG/5ML) IV SOLN
5.0000 mL | Status: DC | PRN
Start: 2022-02-24 — End: 2022-02-24

## 2022-02-24 MED ORDER — HEPARIN SODIUM (PORCINE) 1000 UNIT/ML DIALYSIS: 1000.0000 [IU] | INTRAMUSCULAR | Status: DC | PRN

## 2022-02-24 MED ORDER — LORATADINE 10 MG PO TABS
10.0000 mg | ORAL_TABLET | Freq: Every day | ORAL | Status: DC
  Administered 2022-02-24 – 2022-03-02 (×7): 10 mg via ORAL
  Filled 2022-02-24 (×7): qty 1

## 2022-02-24 MED ORDER — LIDOCAINE-PRILOCAINE 2.5-2.5 % EX CREA: 1.0000 | TOPICAL_CREAM | CUTANEOUS | Status: DC | PRN

## 2022-02-24 MED ORDER — OXYCODONE HCL 5 MG PO TABS: 5.0000 mg | ORAL_TABLET | Freq: Four times a day (QID) | ORAL | Status: DC | PRN

## 2022-02-24 MED ORDER — DIPHENHYDRAMINE HCL 25 MG PO CAPS
25.0000 mg | ORAL_CAPSULE | Freq: Once | ORAL | Status: AC
  Administered 2022-02-24: 25 mg via ORAL
  Filled 2022-02-24: qty 1

## 2022-02-24 MED ORDER — ALLOPURINOL 100 MG PO TABS
100.0000 mg | ORAL_TABLET | Freq: Every day | ORAL | Status: DC
  Administered 2022-02-24 – 2022-03-02 (×7): 100 mg via ORAL
  Filled 2022-02-24 (×7): qty 1

## 2022-02-24 MED ORDER — ASCORBIC ACID 500 MG PO TABS
500.0000 mg | ORAL_TABLET | Freq: Two times a day (BID) | ORAL | Status: DC
  Administered 2022-02-24 – 2022-03-01 (×12): 500 mg via ORAL
  Filled 2022-02-24 (×12): qty 1

## 2022-02-24 MED ORDER — SODIUM CHLORIDE 0.9% IV SOLUTION: Freq: Once | INTRAVENOUS | Status: AC

## 2022-02-24 MED ORDER — PENTAFLUOROPROP-TETRAFLUOROETH EX AERO: 1.0000 | INHALATION_SPRAY | CUTANEOUS | Status: DC | PRN

## 2022-02-24 MED ORDER — FLUTICASONE PROPIONATE 50 MCG/ACT NA SUSP
2.0000 | Freq: Every day | NASAL | Status: DC
  Administered 2022-02-24 – 2022-03-02 (×7): 2 via NASAL
  Filled 2022-02-24: qty 16

## 2022-02-24 MED ORDER — JUVEN PO PACK
1.0000 | PACK | Freq: Two times a day (BID) | ORAL | Status: DC
  Administered 2022-02-24 – 2022-03-02 (×8): 1 via ORAL

## 2022-02-24 MED ORDER — ALTEPLASE 2 MG IJ SOLR: 2.0000 mg | Freq: Once | INTRAMUSCULAR | Status: DC | PRN

## 2022-02-24 MED ORDER — ENSURE MAX PROTEIN PO LIQD
11.0000 [oz_av] | Freq: Two times a day (BID) | ORAL | Status: DC
  Administered 2022-02-24 – 2022-03-02 (×5): 11 [oz_av] via ORAL

## 2022-02-24 MED ORDER — ACETAMINOPHEN 500 MG PO TABS
500.0000 mg | ORAL_TABLET | Freq: Three times a day (TID) | ORAL | Status: AC
  Administered 2022-02-24 – 2022-02-26 (×6): 500 mg via ORAL
  Filled 2022-02-24 (×6): qty 1

## 2022-02-24 MED ORDER — SENNOSIDES-DOCUSATE SODIUM 8.6-50 MG PO TABS
1.0000 | ORAL_TABLET | Freq: Two times a day (BID) | ORAL | Status: DC
Start: 2022-02-24 — End: 2022-02-24

## 2022-02-24 MED ORDER — VANCOMYCIN HCL IN DEXTROSE 1-5 GM/200ML-% IV SOLN
1000.0000 mg | Freq: Once | INTRAVENOUS | Status: DC
  Filled 2022-02-24: qty 200

## 2022-02-24 MED ORDER — PANTOPRAZOLE SODIUM 40 MG PO TBEC
40.0000 mg | DELAYED_RELEASE_TABLET | Freq: Every day | ORAL | Status: DC
  Administered 2022-02-24 – 2022-03-02 (×6): 40 mg via ORAL
  Filled 2022-02-24 (×7): qty 1

## 2022-02-24 MED ORDER — RENA-VITE PO TABS
1.0000 | ORAL_TABLET | Freq: Every day | ORAL | Status: DC
Start: 2022-02-24 — End: 2022-02-24

## 2022-02-24 NOTE — Plan of Care (Signed)
  Problem: Education: Goal: Ability to describe self-care measures that may prevent or decrease complications (Diabetes Survival Skills Education) will improve Outcome: Progressing Goal: Individualized Educational Video(s) Outcome: Progressing   Problem: Coping: Goal: Ability to adjust to condition or change in health will improve Outcome: Progressing   Problem: Nutritional: Goal: Maintenance of adequate nutrition will improve Outcome: Progressing Goal: Progress toward achieving an optimal weight will improve Outcome: Progressing   Problem: Pain Managment: Goal: General experience of comfort will improve Outcome: Progressing   Problem: Safety: Goal: Ability to remain free from injury will improve Outcome: Progressing

## 2022-02-24 NOTE — Progress Notes (Addendum)
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  Patient presents to the emergency department after suffering a mechanical fall at nursing facility.  Patient has been admitted for ESRD (end stage renal disease) ([place]) [N18.6] Wound of buttock, unspecified laterality, initial encounter [S31.809A] Sepsis ([place]) [A41.9] Sepsis, due to unspecified organism, unspecified whether acute organ dysfunction present [facility]) [A41.9]  Patient is known to our practice from previous admissions and receives outpatient dialysis treatment at [facility] on a TTS schedule, supervised by Dr. Candiss Norse.  Patient did receive a full treatment on Saturday.  Patient states she slid off of her air mattress into the floor and was there for about 3 hours.  Denies head injury.  Does complain of generalized soreness.  Remains on room air.  Denies nausea, vomiting, or diarrhea.  Labs on arrival unremarkable for renal patient.  Hemoglobin decreased to 7.9 with white blood cells 21.5.  Chest x-ray shows vascular congestion.  CT head and cervical spine negative for acute changes.  CT abdomen pelvis negative as well.  We have been consulted to manage dialysis needs.   Objective:  Vital signs in last 24 hours:  Temp:  [97.9 F (36.6 C)-98.5 F (36.9 C)] 97.9 F (36.6 C) (07/25 0923) Pulse Rate:  [81-98] 98 (07/25 0923) Resp:  [15-18] 16 (07/25 0923) BP: (117-141)/(54-74) 141/57 (07/25 0923) SpO2:  [100 %] 100 % (07/25 0923)  Weight change:  Filed Weights   02/22/22 0934  Weight: 113.4 kg    Intake/Output: I/O last 3 completed shifts: In: 72 [P.O.:480; IV Piggyback:100] Out: 0    Intake/Output this shift:  Total I/O In: 120 [P.O.:120] Out: -   Physical Exam: General: NAD,  sitting up in bed  Head: Normocephalic, atraumatic. Moist oral mucosal membranes  Eyes: Anicteric  Lungs:  Clear to auscultation, normal effort, room air  Heart: Regular rate and rhythm  Abdomen:  Soft, nontender, obese  Extremities: 1+ peripheral edema.  Neurologic: Nonfocal, moving all four extremities  Skin: No lesions  Access: Right chest PermCath    Basic Metabolic Panel: Recent Labs  Lab 02/22/22 0930 02/23/22 0417 02/24/22 0414  NA 138 136 135  K 4.3 4.5 4.3  CL 100 102 101  CO2 23 20* 22  GLUCOSE 242* 110* 302*  BUN 23 27* 40*  CREATININE 2.97* 3.41* 4.64*  CALCIUM 9.0 8.3* 8.8*  MG  --  1.8  --   PHOS  --  3.0 3.7    Liver Function Tests: Recent Labs  Lab 02/22/22 0930 02/23/22 0417 02/24/22 0414  AST 23 18  --   ALT 20 15  --   ALKPHOS 190* 145*  --   BILITOT 1.1 1.0  --   PROT 6.7 5.2*  --   ALBUMIN 2.4* 1.8* 1.9*   No results for input(s): "LIPASE", "AMYLASE" in the last 168 hours. No results for input(s): "AMMONIA" in the last 168 hours.  CBC: Recent Labs  Lab 02/22/22 0930 02/23/22 0740 02/23/22 1459 02/24/22 0414  WBC 21.5* 20.8*  --  14.4*  NEUTROABS  --  14.9*  --  10.9*  HGB 7.9* 7.1* 6.5* 6.6*  HCT 26.6* 24.0* 21.5* 21.1*  MCV 92.4 93.4  --  92.5  PLT 447* 346  --  333    Cardiac Enzymes: Recent Labs  Lab 02/22/22  0930  CKTOTAL 30*    BNP: Invalid input(s): "POCBNP"  CBG: Recent Labs  Lab 02/23/22 0734 02/23/22 1140 02/23/22 1550 02/23/22 2335 02/24/22 0824  GLUCAP 108* 161* 196* 268* 280*    Microbiology: Results for orders placed or performed during the hospital encounter of 02/22/22  Blood culture (routine x 2)     Status: None (Preliminary result)   Collection Time: 02/22/22 10:29 AM   Specimen: BLOOD  Result Value Ref Range Status   Specimen Description BLOOD RIGHT ANTECUBITAL  Final   Special Requests   Final    BOTTLES DRAWN AEROBIC AND ANAEROBIC Blood Culture results may not be optimal due to an  inadequate volume of blood received in culture bottles   Culture   Final    NO GROWTH 2 DAYS Performed at [facility], [address] [place], [place] [address]    Report Status PENDING  Incomplete  Blood culture (routine x 2)     Status: None (Preliminary result)   Collection Time: 02/22/22 10:29 AM   Specimen: BLOOD  Result Value Ref Range Status   Specimen Description BLOOD Blood Culture adequate volume  Final   Special Requests   Final    BOTTLES DRAWN AEROBIC AND ANAEROBIC LEFT ANTECUBITAL   Culture   Final    NO GROWTH 2 DAYS Performed at [facility], [address] [place], [place] [address]    Report Status PENDING  Incomplete    Coagulation Studies: No results for input(s): "LABPROT", "INR" in the last 72 hours.  Urinalysis: No results for input(s): "COLORURINE", "LABSPEC", "PHURINE", "GLUCOSEU", "HGBUR", "BILIRUBINUR", "KETONESUR", "PROTEINUR", "UROBILINOGEN", "NITRITE", "LEUKOCYTESUR" in the last 72 hours.  Invalid input(s): "APPERANCEUR"    Imaging: DG Ribs Unilateral Left  Result Date: 02/24/2022 CLINICAL DATA:  Left rib pain. EXAM: LEFT RIBS - 2 VIEW COMPARISON:  None Available. FINDINGS: Seventh anterior rib fracture noted. This is also noted on prior abdominal CT scan 02/22/2022 in peers subacute/healing. IMPRESSION: Seventh anterior rib fracture appears subacute/healing. Electronically Signed   By: Marijo Sanes M.D.   On: 02/24/2022 11:05   DG Ribs Unilateral W/Chest Right  Result Date: 02/24/2022 CLINICAL DATA:  Right rib pain. EXAM: RIGHT RIBS AND CHEST - 3+ VIEW COMPARISON:  Multiple prior chest x-rays. FINDINGS: The right IJ dialysis catheter is stable. The heart is stable in size. Stable tortuosity and ectasia of the thoracic aorta. Streaky left basilar scarring changes. No pleural effusion or pneumothorax. Healing right posterior rib fractures, 5 through 8. IMPRESSION: 1. Healing right posterior rib fractures, 5 through 8. 2. No  acute cardiopulmonary findings. Electronically Signed   By: Marijo Sanes M.D.   On: 02/24/2022 11:00   US PELVIS (TRANSABDOMINAL ONLY)  Result Date: 02/23/2022 CLINICAL DATA:  Endometrial thickening seen on CT EXAM: TRANSABDOMINAL ULTRASOUND OF PELVIS TECHNIQUE: Transabdominal ultrasound examination of the pelvis was performed including evaluation of the uterus, ovaries, adnexal regions, and pelvic cul-de-sac. COMPARISON:  CT 02/22/2022, ultrasound 12/19/2021 FINDINGS: Uterus Measurements: 9.0 x 3.6 x 5.3 cm = volume: 88 mL. 1.4 cm calcification of the posterior uterine body near the fundus, likely reflecting a small calcified uterine fibroid. Endometrium Thickness: 11 mm. Heterogeneous appearance of the endometrial cavity in the lower uterine segment with trace fluid. Right ovary Not visualized. Left ovary Not visualized. Other findings: Layering debris was noted within the urinary bladder lumen. IMPRESSION: 1. Abnormally thickened endometrium measuring up to 11 mm. Trace fluid within the endometrial canal at the lower uterine segment. Endometrial sampling  is indicated to exclude carcinoma. 2. Nonvisualization of the bilateral ovaries. 3. Layering debris noted within the urinary bladder, which could be secondary to stasis possibly blood products. Correlate with urinalysis. Electronically Signed   By: Davina Poke D.O.   On: 02/23/2022 17:28   CT ABDOMEN PELVIS WO CONTRAST  Result Date: 02/22/2022 CLINICAL DATA:  Patient fell out of bed.  Found down.  Bedsores. EXAM: CT ABDOMEN AND PELVIS WITHOUT CONTRAST TECHNIQUE: Multidetector CT imaging of the abdomen and pelvis was performed following the standard protocol without IV contrast. RADIATION DOSE REDUCTION: This exam was performed according to the departmental dose-optimization program which includes automated exposure control, adjustment of the mA and/or kV according to patient size and/or use of iterative reconstruction technique. COMPARISON:   01/01/2022 FINDINGS: Lower chest: Nonacute posterolateral right rib fractures evident. Hepatobiliary: Nodular liver contour is compatible with cirrhosis. No suspicious focal abnormality in the liver on this study without intravenous contrast. There is no evidence for gallstones, gallbladder wall thickening, or pericholecystic fluid. No intrahepatic or extrahepatic biliary dilation. Pancreas: No focal mass lesion. No dilatation of the main duct. No intraparenchymal cyst. No peripancreatic edema. Spleen: 16 mm low-density lesion in the medial spleen is stable, likely benign. Adrenals/Urinary Tract: No adrenal nodule or mass. Tiny bilateral renal lesions of varying attenuation are stable, likely a combination of simple and complex cyst. No evidence for hydroureter. Bladder is distended. Stomach/Bowel: Stomach is unremarkable. No gastric wall thickening. No evidence of outlet obstruction. Duodenum is normally positioned as is the ligament of Treitz. No small bowel wall thickening. No small bowel dilatation. The terminal ileum is normal. The appendix is not well visualized, but there is no edema or inflammation in the region of the cecum. No gross colonic mass. No colonic wall thickening. Vascular/Lymphatic: There is mild atherosclerotic calcification of the abdominal aorta without aneurysm. There is no gastrohepatic or hepatoduodenal ligament lymphadenopathy. No retroperitoneal or mesenteric lymphadenopathy. No pelvic sidewall lymphadenopathy. Reproductive: Endometrial canal measures 13 mm thickness in the lower uterine segment, abnormal in a postmenopausal female. There is no adnexal mass. Other: No intraperitoneal free fluid. Musculoskeletal: No worrisome lytic or sclerotic osseous abnormality. No evidence for lumbar spine fracture Bilateral pars interarticularis defects noted at L5. Sacral decubitus ulcer is evident. IMPRESSION: 1. No acute findings in the abdomen or pelvis. 2. Nodular liver contour compatible with  cirrhosis. 3. Endometrial canal measures 13 mm thickness in the lower uterine segment, abnormal in a postmenopausal female. Pelvic ultrasound recommended to further evaluate. 4. 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 No acute bony abnormality identified in the lumbar spine. Please see report for lumbar spine CT performed the same time and dictated separately. 5. Bilateral sacral decubitus ulcers. 6.  Aortic Atherosclerois (ICD10-170.0) Electronically Signed   By: Misty Stanley M.D.   On: 02/22/2022 13:50   CT L-SPINE NO CHARGE  Result Date: 02/22/2022 CLINICAL DATA:  Fall out of bed. Dialysis patient, patient states was left in the floor for 3 hours. EXAM: CT LUMBAR SPINE WITHOUT CONTRAST TECHNIQUE: Multidetector CT imaging of the lumbar spine was performed without intravenous contrast administration. Multiplanar CT image reconstructions were also generated. RADIATION DOSE REDUCTION: This exam was performed according to the departmental dose-optimization program which includes automated exposure control, adjustment of the mA and/or kV according to patient size and/or use of iterative reconstruction technique. COMPARISON:  None Available. FINDINGS: Segmentation: 5 lumbar type vertebrae. Alignment: Straightening of the lumbar spine. Grade 1 anterolisthesis of L5 with bilateral spondylolysis. Vertebrae: No acute fracture  or focal pathologic process. Paraspinal and other soft tissues: Marked subcutaneous soft tissue edema and skin irregularity about the lower lumbar/pelvic area consistent with history of known decubitus ulcers. No appreciable fluid collection. Disc levels: T12-L1: No significant finding. L1-L2: No significant finding. L2-L3: Mild disc bulge without significant spinal canal or neural foraminal stenosis. L3-L4: Asymmetric disc bulge to the right with mild left lateral recess stenosis. No significant neural foraminal or spinal canal stenosis. L4-L5: Moderate bilateral lateral recess stenosis  and mild spinal canal stenosis. Mild bilateral facet joint arthropathy. L5-S1: Moderate bilateral facet joint arthropathy. No significant spinal canal or neural foraminal stenosis. IMPRESSION: 1.  No acute fracture or traumatic subluxation. 2.  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 3. Skin thickening and deep skin wound in the low lumbar/sacral area consistent with patient's known history of decubitus ulcers. Clinical correlation is suggested. Electronically Signed   By: Keane Police D.O.   On: 02/22/2022 13:43   CT HEAD WO CONTRAST (5MM)  Result Date: 02/22/2022 CLINICAL DATA:  Fall with head and neck trauma. EXAM: CT HEAD WITHOUT CONTRAST CT CERVICAL SPINE WITHOUT CONTRAST TECHNIQUE: Multidetector CT imaging of the head and cervical spine was performed following the standard protocol without intravenous contrast. Multiplanar CT image reconstructions of the cervical spine were also generated. RADIATION DOSE REDUCTION: This exam was performed according to the departmental dose-optimization program which includes automated exposure control, adjustment of the mA and/or kV according to patient size and/or use of iterative reconstruction technique. COMPARISON:  12/18/2021 FINDINGS: CT HEAD FINDINGS Brain: Ventricles, cisterns and other CSF spaces are normal. There is mild chronic ischemic microvascular disease. There is no mass, mass effect, shift of midline structures or acute hemorrhage. No evidence of acute infarction. Vascular: No hyperdense vessel or unexpected calcification. Skull: Normal. Negative for fracture or focal lesion. Sinuses/Orbits: No acute finding. Other: None. CT CERVICAL SPINE FINDINGS Alignment: Normal. Skull base and vertebrae: Vertebral body heights are maintained. There is mild spondylosis throughout the cervical spine to include facet arthropathy and uncovertebral joint spurring. Bilateral neural foraminal narrowing at the C4-5 level and C5-6 levels. No acute fracture.  Atlantoaxial articulation is unremarkable. Soft tissues and spinal canal: Prevertebral soft tissues are normal. No canal stenosis. Disc levels:  Disc space narrowing at the C4-5 and C5-6 levels. Upper chest: No acute findings. Other: None. IMPRESSION: 1. No acute brain injury. 2. Mild chronic ischemic microvascular disease. 3. No acute cervical spine injury. 4. Mild spondylosis of the cervical spine with disc disease at the C4-5 and C5-6 levels. Bilateral neural foraminal narrowing at the C4-5 and C5-6 levels. Electronically Signed   By: Marin Olp M.D.   On: 02/22/2022 13:38   CT Cervical Spine Wo Contrast  Result Date: 02/22/2022 CLINICAL DATA:  Fall with head and neck trauma. EXAM: CT HEAD WITHOUT CONTRAST CT CERVICAL SPINE WITHOUT CONTRAST TECHNIQUE: Multidetector CT imaging of the head and cervical spine was performed following the standard protocol without intravenous contrast. Multiplanar CT image reconstructions of the cervical spine were also generated. RADIATION DOSE REDUCTION: This exam was performed according to the departmental dose-optimization program which includes automated exposure control, adjustment of the mA and/or kV according to patient size and/or use of iterative reconstruction technique. COMPARISON:  12/18/2021 FINDINGS: CT HEAD FINDINGS Brain: Ventricles, cisterns and other CSF spaces are normal. There is mild chronic ischemic microvascular disease. There is no mass, mass effect, shift of midline structures or acute hemorrhage. No evidence of acute infarction. Vascular: No hyperdense vessel or  unexpected calcification. Skull: Normal. Negative for fracture or focal lesion. Sinuses/Orbits: No acute finding. Other: None. CT CERVICAL SPINE FINDINGS Alignment: Normal. Skull base and vertebrae: Vertebral body heights are maintained. There is mild spondylosis throughout the cervical spine to include facet arthropathy and uncovertebral joint spurring. Bilateral neural foraminal narrowing at  the C4-5 level and C5-6 levels. No acute fracture. Atlantoaxial articulation is unremarkable. Soft tissues and spinal canal: Prevertebral soft tissues are normal. No canal stenosis. Disc levels:  Disc space narrowing at the C4-5 and C5-6 levels. Upper chest: No acute findings. Other: None. IMPRESSION: 1. No acute brain injury. 2. Mild chronic ischemic microvascular disease. 3. No acute cervical spine injury. 4. Mild spondylosis of the cervical spine with disc disease at the C4-5 and C5-6 levels. Bilateral neural foraminal narrowing at the C4-5 and C5-6 levels. Electronically Signed   By: Marin Olp M.D.   On: 02/22/2022 13:38     Medications:    anticoagulant sodium citrate     cefTRIAXone (ROCEPHIN)  IV 2 g (02/24/22 1054)   metronidazole 500 mg (02/24/22 0300)    (feeding supplement) PROSource Plus  30 mL Oral TID BM   sodium chloride   Intravenous Once   acetaminophen  500 mg Oral TID   allopurinol  100 mg Oral Daily   ascorbic acid  500 mg Oral BID   atorvastatin  40 mg Oral Daily   Chlorhexidine Gluconate Cloth  6 each Topical Daily   fluticasone  2 spray Each Nare Daily   gabapentin  200 mg Oral TID   heparin injection (subcutaneous)  5,000 Units Subcutaneous Q8H   insulin aspart  0-6 Units Subcutaneous TID WC   insulin aspart  3 Units Subcutaneous TID WC   insulin glargine-yfgn  15 Units Subcutaneous Daily   loratadine  10 mg Oral Daily   metoprolol tartrate  25 mg Oral BID   midodrine  10 mg Oral BID WC   multivitamin  1 tablet Oral QHS   nutrition supplement (JUVEN)  1 packet Oral BID BM   omega-3 acid ethyl esters  1 g Oral Daily   pantoprazole  40 mg Oral Q0600   Ensure Max Protein  11 oz Oral BID   senna-docusate  1 tablet Oral QHS   vancomycin variable dose per unstable renal function (pharmacist dosing)   Does not apply See admin instructions   acetaminophen, alteplase, anticoagulant sodium citrate, heparin, HYDROmorphone (DILAUDID) injection, lidocaine (PF),  lidocaine-prilocaine, oxyCODONE, pentafluoroprop-tetrafluoroeth, polyethylene glycol  Assessment/ Plan:  Theresa Warren is a 66 y.o.  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  Patient presents to the emergency department after suffering a mechanical fall at nursing facility.  Patient has been admitted for ESRD (end stage renal disease) ([place]) [N18.6] Wound of buttock, unspecified laterality, initial encounter [S31.809A] Sepsis ([place]) [A41.9] Sepsis, due to unspecified organism, unspecified whether acute organ dysfunction present ([place]) [A41.9]  [facility] [place]/TTS/Rt Permcath  End-stage renal disease on hemodialysis.  Will maintain outpatient schedule if possible.  Hepatitis B panel ordered per hospital policy.  Patient will receive dialysis treatment today, UF goal 2 to 2.5 L as tolerated.  Next treatment scheduled for Thursday.  2. Anemia of chronic kidney disease Lab Results  Component Value Date   HGB 6.6 (L) 02/24/2022   Hemoglobin below desired target.  Primary team has ordered blood transfusion.  Patient receives [facility] outpatient.  3.  Secondary Hyperparathyroidism:  Lab Results  Component Value Date   PTH 23 01/15/2022   CALCIUM 8.8 (L) 02/24/2022   CAION 1.11 (L) 01/01/2022   PHOS 3.7 02/24/2022   .  Calcium and phosphorus remain within acceptable target.  We will continue to monitor.  4.  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  Patient will receive sodium thiosulfate with dialysis treatments.  Currently receiving nutritional supplement, Juven.  5. Diabetes mellitus type II with chronic kidney disease: insulin dependent. Most recent hemoglobin A1c is 10.0 on 12/18/21.     LOS: 2   7/25/202312:06 PM

## 2022-02-24 NOTE — TOC Progression Note (Addendum)
Transition of Care [facility]) - Progression Note    Patient Details  Name: Theresa Warren MRN: [id number] Date of Birth: 1956-04-07  [facility] CM/SW [place], LCSW Phone Number: 02/24/2022, 8:59 AM  Clinical Narrative:   [facility] declined bed offer. [facility] will review referral.  11:48 am: Called and updated [name]. Reviewed current offers. Checking with these facilities to see if patient would have to pay any copays up front or not. [facility] is unsure when their next female bed will be available. [facility] currently does not have any beds at this time. [facility] admissions coordinator is checking on payment process for copays.  Expected Discharge Plan: [place] Barriers to Discharge: Continued Medical Work up  Expected Discharge Plan and Services Expected Discharge Plan: [place] In-house Referral: Clinical Social Work   Post Acute Care Choice: [place] Living arrangements for the past 2 months: Single Family Home                                       Social Determinants of Health (SDOH) Interventions    Readmission Risk Interventions     No data to display
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
## 2022-02-24 NOTE — Progress Notes (Signed)
Pt HD ended 20 minutes early d/t high TMP alarms and clots in system. Pt alert, no c/o, stable. 75.6L BVP and 2L removed. Catheter limbs locked w/ Heparin. Report to primary RN.

## 2022-02-24 NOTE — Progress Notes (Signed)
PT Cancellation Note  Patient Details Name: Theresa Warren MRN: [id number] DOB: 23-Apr-1956   Cancelled Treatment:    Reason Eval/Treat Not Completed: Patient at HD and not available, will attempt to see pt at a future date/time as medically appropriate.     Linus Salmons PT, DPT 02/24/22, 2:06 PM

## 2022-02-24 NOTE — Progress Notes (Signed)
Pre HD RN assessment 

## 2022-02-24 NOTE — Plan of Care (Signed)
  Problem: Coping: Goal: Ability to adjust to condition or change in health will improve Outcome: Progressing   Problem: Fluid Volume: Goal: Ability to maintain a balanced intake and output will improve Outcome: Progressing   Problem: Health Behavior/Discharge Planning: Goal: Ability to identify and utilize available resources and services will improve Outcome: Progressing   Problem: Health Behavior/Discharge Planning: Goal: Ability to manage health-related needs will improve Outcome: Progressing   Problem: Health Behavior/Discharge Planning: Goal: Ability to manage health-related needs will improve Outcome: Progressing   Problem: Metabolic: Goal: Ability to maintain appropriate glucose levels will improve Outcome: Progressing   Problem: Nutritional: Goal: Maintenance of adequate nutrition will improve Outcome: Progressing   Problem: Nutritional: Goal: Progress toward achieving an optimal weight will improve Outcome: Progressing   Problem: Skin Integrity: Goal: Risk for impaired skin integrity will decrease Outcome: Progressing   Problem: Tissue Perfusion: Goal: Adequacy of tissue perfusion will improve Outcome: Progressing   Problem: Education: Goal: Knowledge of General Education information will improve Description: Including pain rating scale, medication(s)/side effects and non-pharmacologic comfort measures Outcome: Progressing   Problem: Health Behavior/Discharge Planning: Goal: Ability to manage health-related needs will improve Outcome: Progressing   Problem: Clinical Measurements: Goal: Ability to maintain clinical measurements within normal limits will improve Outcome: Progressing   Problem: Clinical Measurements: Goal: Will remain free from infection Outcome: Progressing   Problem: Clinical Measurements: Goal: Diagnostic test results will improve Outcome: Progressing   Problem: Clinical Measurements: Goal: Respiratory complications will  improve Outcome: Progressing   Problem: Clinical Measurements: Goal: Cardiovascular complication will be avoided Outcome: Progressing   Problem: Activity: Goal: Risk for activity intolerance will decrease Outcome: Progressing   Problem: Nutrition: Goal: Adequate nutrition will be maintained Outcome: Progressing   Problem: Elimination: Goal: Will not experience complications related to urinary retention Outcome: Progressing

## 2022-02-25 ENCOUNTER — Inpatient Hospital Stay: Payer: Medicare Other

## 2022-02-25 DIAGNOSIS — N186 End stage renal disease: Secondary | ICD-10-CM | POA: Diagnosis not present

## 2022-02-25 DIAGNOSIS — D631 Anemia in chronic kidney disease: Secondary | ICD-10-CM | POA: Diagnosis not present

## 2022-02-25 DIAGNOSIS — R197 Diarrhea, unspecified: Secondary | ICD-10-CM | POA: Diagnosis not present

## 2022-02-25 DIAGNOSIS — A419 Sepsis, unspecified organism: Secondary | ICD-10-CM | POA: Diagnosis not present

## 2022-02-25 DIAGNOSIS — R6520 Severe sepsis without septic shock: Secondary | ICD-10-CM | POA: Diagnosis not present

## 2022-02-25 DIAGNOSIS — S31829A Unspecified open wound of left buttock, initial encounter: Secondary | ICD-10-CM | POA: Diagnosis not present

## 2022-02-25 LAB — RENAL FUNCTION PANEL
Albumin: 2.1 g/dL — ABNORMAL LOW (ref 3.5–5.0)
Anion gap: 18 — ABNORMAL HIGH (ref 5–15)
BUN: 34 mg/dL — ABNORMAL HIGH (ref 8–23)
CO2: 21 mmol/L — ABNORMAL LOW (ref 22–32)
Calcium: 8.8 mg/dL — ABNORMAL LOW (ref 8.9–10.3)
Chloride: 100 mmol/L (ref 98–111)
Creatinine, Ser: 3.55 mg/dL — ABNORMAL HIGH (ref 0.44–1.00)
GFR, Estimated: 14 mL/min — ABNORMAL LOW (ref >60)
Glucose, Bld: 96 mg/dL (ref 70–99)
Phosphorus: 2.8 mg/dL (ref 2.5–4.6)
Potassium: 3.5 mmol/L (ref 3.5–5.1)
Sodium: 139 mmol/L (ref 135–145)

## 2022-02-25 LAB — CBC WITH DIFFERENTIAL/PLATELET
Abs Immature Granulocytes: 0.33 10*3/uL — ABNORMAL HIGH (ref 0.00–0.07)
Basophils Absolute: 0.1 10*3/uL (ref 0.0–0.1)
Basophils Relative: 1 %
Eosinophils Absolute: 0.6 10*3/uL — ABNORMAL HIGH (ref 0.0–0.5)
Eosinophils Relative: 3 %
HCT: 27.8 % — ABNORMAL LOW (ref 36.0–46.0)
Hemoglobin: 8.7 g/dL — ABNORMAL LOW (ref 12.0–15.0)
Immature Granulocytes: 2 %
Lymphocytes Relative: 9 %
Lymphs Abs: 1.5 10*3/uL (ref 0.7–4.0)
MCH: 28 pg (ref 26.0–34.0)
MCHC: 31.3 g/dL (ref 30.0–36.0)
MCV: 89.4 fL (ref 80.0–100.0)
Monocytes Absolute: 0.9 10*3/uL (ref 0.1–1.0)
Monocytes Relative: 5 %
Neutro Abs: 13.8 10*3/uL — ABNORMAL HIGH (ref 1.7–7.7)
Neutrophils Relative %: 80 %
Platelets: 305 10*3/uL (ref 150–400)
RBC: 3.11 MIL/uL — ABNORMAL LOW (ref 3.87–5.11)
RDW: 18.2 % — ABNORMAL HIGH (ref 11.5–15.5)
WBC: 17.2 10*3/uL — ABNORMAL HIGH (ref 4.0–10.5)
nRBC: 0 % (ref 0.0–0.2)

## 2022-02-25 LAB — GASTROINTESTINAL PANEL BY PCR, STOOL (REPLACES STOOL CULTURE)

## 2022-02-25 LAB — GLUCOSE, CAPILLARY
Glucose-Capillary: 91 mg/dL (ref 70–99)
Glucose-Capillary: 123 mg/dL — ABNORMAL HIGH (ref 70–99)
Glucose-Capillary: 121 mg/dL — ABNORMAL HIGH (ref 70–99)
Glucose-Capillary: 69 mg/dL — ABNORMAL LOW (ref 70–99)
Glucose-Capillary: 107 mg/dL — ABNORMAL HIGH (ref 70–99)
Glucose-Capillary: 69 mg/dL — ABNORMAL LOW (ref 70–99)

## 2022-02-25 LAB — HEPATITIS B SURFACE ANTIBODY, QUANTITATIVE: Hep B S AB Quant (Post): 66.9 m[IU]/mL (ref >9.9)

## 2022-02-25 MED ORDER — PSYLLIUM 95 % PO PACK
1.0000 | PACK | Freq: Every day | ORAL | Status: DC
  Administered 2022-02-25 – 2022-02-28 (×4): 1 via ORAL
  Filled 2022-02-25 (×6): qty 1

## 2022-02-25 MED ORDER — CHOLESTYRAMINE LIGHT 4 G PO PACK
4.0000 g | PACK | Freq: Two times a day (BID) | ORAL | Status: DC
  Administered 2022-02-25 – 2022-03-02 (×10): 4 g via ORAL
  Filled 2022-02-25 (×11): qty 1

## 2022-02-25 MED ORDER — VANCOMYCIN HCL IN DEXTROSE 1-5 GM/200ML-% IV SOLN
1000.0000 mg | Freq: Once | INTRAVENOUS | Status: AC
  Administered 2022-02-25: 1000 mg via INTRAVENOUS
  Filled 2022-02-25 (×2): qty 200

## 2022-02-25 MED ORDER — ONDANSETRON HCL 4 MG/2ML IJ SOLN
4.0000 mg | Freq: Four times a day (QID) | INTRAMUSCULAR | Status: DC | PRN
  Administered 2022-02-25 – 2022-02-27 (×2): 4 mg via INTRAVENOUS
  Filled 2022-02-25 (×2): qty 2

## 2022-02-25 NOTE — Progress Notes (Signed)
PT Cancellation Note  Patient Details Name: Theresa Warren MRN: [id number] DOB: [birth year]   Cancelled Treatment:    Reason Eval/Treat Not Completed: Medical issues which prohibited therapy;Patient not medically ready (Pt reports to be nauseated still, got sick earlier working with OT, medication not helping much.) Pt asks that PT return for session early in day tomorrow.   3:11 PM, 02/25/22 Etta Grandchild, PT, DPT Physical Therapist - [facility]  [phone] ([place])    [place] C 02/25/2022, 3:11 PM

## 2022-02-25 NOTE — Progress Notes (Signed)
PROGRESS NOTE    Theresa Warren  XIP:[id number] DOB: [birth year] DOA: 02/22/2022 PCP: Crist Infante, MD    Brief Narrative:  66 y.o. 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     The patient presents to [facility] ED from SNF via EMS due to a fall, she slid off her bed.  Imaging in the ED is reassuring, no fractures.  Noted to be tachycardic with leukocytosis greater than 21000 with concern for sepsis secondary to necrotic sacral decubitus wounds.  The patient was started on empiric broad-spectrum IV antibiotics, IV vancomycin, Rocephin and IV Flagyl.  EDP discussed the case with general surgery.  The patient may require I&D.  Made n.p.o. until seen by general surgery.  The patient was admitted by [facility], hospitalist service.   Assessment & Plan:   Principal Problem:   Sepsis ([place]) Active Problems:   Chronic pain syndrome   Anemia of renal disease   Type 2 diabetes mellitus with chronic kidney disease, with long-term current use of insulin (HCC)   Severe obesity (BMI >= 40) (HCC)   ESRD (end stage renal disease) (HCC)   Wound of buttock   Bilateral lower extremity edema   Anemia due to chronic kidney disease, on chronic dialysis ([place])   Hypertension  Sepsis secondary to necrotic sacral decubitus wounds, POA -Patient presented with criteria for sepsis with leukocytosis, tachycardia, sacral pain with concern for infected necrotic sacral decubitus wounds. -Status post bedside debridement by general surgery -Blood cultures no growth to date -Leukocytosis persistent Plan: Continue broad-spectrum IV antibiotics for now Multimodal pain control WOC  follow-up ID consulted 7/26  Acute diarrhea Noted initially on 7/25.  Bedside RN on 7/26 notes persistent watery diarrhea.  No fever.  Positive leukocytosis.  No abdominal cramping. Plan: At this time clinical suspicion for C. difficile is low.  Infectious disease consulted for comment.  In the meantime we will order KUB, Questran 4 g twice daily.  GI PCR panel.  If there is low/no clinical suspicion for infectious diarrhea we will consider addition of Imodium/Lomotil.  Also consider rectal Foley placement given substantial sacral wounds.  End-stage renal disease on HD TTS Possible calciphylaxis Nephrology following patient while in house.  Concern for calciphylaxis.  Is on sodium thiosulfate  Chronic bilateral lower extremity edema -May be secondary to volume overload from end-stage renal disease. -Patient noted to have had bilateral lower extremity Doppler ultrasound on 01/12/2022 with no evidence of DVT. -Keep lower extremities elevated. -Nephrology consulted for hemodialysis with fluid removal as tolerated  Type 2 diabetes mellitus with hyperglycemia -Last hemoglobin A1c 10.0 on 12/18/2021.  Poor control -Continue Semglee 15 units daily.  NovoLog 3 units 3 times daily with meals.  SSI.   Anemia -Likely anemia of chronic disease secondary to end-stage renal disease. -Hemoglobin at 6.6 from 7.1 from 7.9 on admission, was 9.0 on 02/06/2022. -Patient with no overt bleeding. -Anemia panel consistent with anemia of chronic disease.   -Transfused 2 units packed red blood cells during hemodialysis 7/25.   -Follow-up H&H reassuring -Transfusion threshold hemoglobin < 7.  -If patient needs another transfusion will likely need to be given during HD.  Hyperlipidemia -Continue Lipitor.  Diabetic polyneuropathy/chronic pain -Continue home regimen Neurontin. -Continue home regiment pain  medications  Physical deconditioning with fall -PT/OT. -Fall precautions. -Likely needs SNF  placement. -TOC consult.  Hypertension -Patient noted to have soft blood pressures on presentation. -BP improving.   -Continue to hold home oral antihypertensive medications and could likely start resuming tomorrow.   -Scheduled midodrine  Severe obesity/morbid obesity -Lifestyle modification. -Outpatient follow-up with PCP.   endometrial thickness -Noted on CT abdomen and pelvis with a endometrial canal measuring 13 mm thickness in the lower urinary segment. -Pelvic ultrasound with thickened endometrium measuring up to 11 mm.  Trace fluid within the endometrial canal at the lower uterine segment.  Endometrial sampling is indicated to exclude carcinoma.  Nonvisualization of bilateral ovaries.  Layering debris noted within the urinary bladder which could be secondary to stasis possibly blood products.  Correlate with urinalysis. -Phone consultation was done with OB/GYN, Dr. Rolly Salter who reviewed the charts, reviewed the pelvic ultrasound and recommended outpatient follow-up with GYN for endometrial biopsy. -See follow-up in discharge navigator.    DVT prophylaxis: SQ heparin Code Status: Full Family Communication: None today Disposition Plan: Status is: Inpatient Remains inpatient appropriate because: Sepsis on broad-spectrum IV antibiotics   Level of care: Med-Surg  Consultants:  ID  Procedures:  Bedside debridement of sacral ulcer 7/25  Antimicrobials: Vancomycin Cefepime Flagyl   Subjective: Seen and examined.  Resting comfortably in bed.  No visible distress.  Does endorse diarrhea.   Objective: Vitals:   02/24/22 2228 02/25/22 0631 02/25/22 0803 02/25/22 1218  BP: (!) 151/68 (!) 158/66 (!) 148/69 (!) 151/68  Pulse: 73 82 88 71  Resp: 15 20 16 16   Temp: 98.8 F (37.1 C) 98.6 F (37 C) 99.3 F (37.4 C) 98.2 F (36.8 C)  TempSrc:      SpO2: 100% 100% 100% 100%  Weight:      Height:        Intake/Output Summary (Last 24 hours) at 02/25/2022 1317 Last data  filed at 02/25/2022 1019 Gross per 24 hour  Intake 1288 ml  Output 2.1 ml  Net 1285.9 ml   Filed Weights   02/22/22 0934  Weight: 113.4 kg    Examination:  General exam: NAD.  Appears chronically ill Respiratory system: Lung sounds diminished at bases.  Scattered crackles bilaterally normal work of breathing.  Room air Cardiovascular system: S1-S2, RRR, no murmurs, 2+ pedal edema BLE Gastrointestinal system: Obese, soft, NT/ND, normal bowel sounds Central nervous system: Alert and oriented. No focal neurological deficits. Extremities: Symmetric 5 x 5 power. Skin: Sacral decubitus ulcers with areas of necrotic eschar Psychiatry: Judgement and insight appear normal. Mood & affect appropriate.     Data Reviewed: I have personally reviewed following labs and imaging studies  CBC: Recent Labs  Lab 02/22/22 0930 02/23/22 0740 02/23/22 1459 02/24/22 0414 02/25/22 0606  WBC 21.5* 20.8*  --  14.4* 17.2*  NEUTROABS  --  14.9*  --  10.9* 13.8*  HGB 7.9* 7.1* 6.5* 6.6* 8.7*  HCT 26.6* 24.0* 21.5* 21.1* 27.8*  MCV 92.4 93.4  --  92.5 89.4  PLT 447* 346  --  333 833   Basic Metabolic Panel: Recent Labs  Lab 02/22/22 0930 02/23/22 0417 02/24/22 0414 02/25/22 0606  NA 138 136 135 139  K 4.3 4.5 4.3 3.5  CL 100 102 101 100  CO2 23 20* 22 21*  GLUCOSE 242* 110* 302* 96  BUN 23 27* 40* 34*  CREATININE 2.97* 3.41* 4.64* 3.55*  CALCIUM 9.0 8.3* 8.8* 8.8*  MG  --  1.8  --   --  PHOS  --  3.0 3.7 2.8   GFR: Estimated Creatinine Clearance: 19.1 mL/min (A) (by C-G formula based on SCr of 3.55 mg/dL (H)). Liver Function Tests: Recent Labs  Lab 02/22/22 0930 02/23/22 0417 02/24/22 0414 02/25/22 0606  AST 23 18  --   --   ALT 20 15  --   --   ALKPHOS 190* 145*  --   --   BILITOT 1.1 1.0  --   --   PROT 6.7 5.2*  --   --   ALBUMIN 2.4* 1.8* 1.9* 2.1*   No results for input(s): "LIPASE", "AMYLASE" in the last 168 hours. No results for input(s): "AMMONIA" in the last 168  hours. Coagulation Profile: No results for input(s): "INR", "PROTIME" in the last 168 hours. Cardiac Enzymes: Recent Labs  Lab 02/22/22 0930  CKTOTAL 30*   BNP (last 3 results) No results for input(s): "PROBNP" in the last 8760 hours. HbA1C: No results for input(s): "HGBA1C" in the last 72 hours. CBG: Recent Labs  Lab 02/24/22 1248 02/24/22 1742 02/24/22 2025 02/25/22 0801 02/25/22 1221  GLUCAP 287* 197* 164* 91 123*   Lipid Profile: No results for input(s): "CHOL", "HDL", "LDLCALC", "TRIG", "CHOLHDL", "LDLDIRECT" in the last 72 hours. Thyroid Function Tests: No results for input(s): "TSH", "T4TOTAL", "FREET4", "T3FREE", "THYROIDAB" in the last 72 hours. Anemia Panel: Recent Labs    02/23/22 0740 02/23/22 1038 02/23/22 1459  VITAMINB12  --   --  1,076*  FOLATE  --  12.6  --   FERRITIN  --  905*  --   TIBC  --  NOT CALCULATED  --   IRON  --  21*  --   RETICCTPCT 2.7  --   --    Sepsis Labs: Recent Labs  Lab 02/22/22 0932  LATICACIDVEN 1.5    Recent Results (from the past 240 hour(s))  Blood culture (routine x 2)     Status: None (Preliminary result)   Collection Time: 02/22/22 10:29 AM   Specimen: BLOOD  Result Value Ref Range Status   Specimen Description BLOOD RIGHT ANTECUBITAL  Final   Special Requests   Final    BOTTLES DRAWN AEROBIC AND ANAEROBIC Blood Culture results may not be optimal due to an inadequate volume of blood received in culture bottles   Culture   Final    NO GROWTH 3 DAYS Performed at [facility], [address], [place], [place] [address]    Report Status PENDING  Incomplete  Blood culture (routine x 2)     Status: None (Preliminary result)   Collection Time: 02/22/22 10:29 AM   Specimen: BLOOD  Result Value Ref Range Status   Specimen Description BLOOD Blood Culture adequate volume  Final   Special Requests   Final    BOTTLES DRAWN AEROBIC AND ANAEROBIC LEFT ANTECUBITAL   Culture   Final    NO GROWTH 3  DAYS Performed at [facility], [address] [place], [place] [address]    Report Status PENDING  Incomplete         Radiology Studies: DG Ribs Unilateral Left  Result Date: 02/24/2022 CLINICAL DATA:  Left rib pain. EXAM: LEFT RIBS - 2 VIEW COMPARISON:  None Available. FINDINGS: Seventh anterior rib fracture noted. This is also noted on prior abdominal CT scan 02/22/2022 in peers subacute/healing. IMPRESSION: Seventh anterior rib fracture appears subacute/healing. Electronically Signed   By: Marijo Sanes M.D.   On: 02/24/2022 11:05   DG Ribs Unilateral W/Chest  Right  Result Date: 02/24/2022 CLINICAL DATA:  Right rib pain. EXAM: RIGHT RIBS AND CHEST - 3+ VIEW COMPARISON:  Multiple prior chest x-rays. FINDINGS: The right IJ dialysis catheter is stable. The heart is stable in size. Stable tortuosity and ectasia of the thoracic aorta. Streaky left basilar scarring changes. No pleural effusion or pneumothorax. Healing right posterior rib fractures, 5 through 8. IMPRESSION: 1. Healing right posterior rib fractures, 5 through 8. 2. No acute cardiopulmonary findings. Electronically Signed   By: Marijo Sanes M.D.   On: 02/24/2022 11:00   US PELVIS (TRANSABDOMINAL ONLY)  Result Date: 02/23/2022 CLINICAL DATA:  Endometrial thickening seen on CT EXAM: TRANSABDOMINAL ULTRASOUND OF PELVIS TECHNIQUE: Transabdominal ultrasound examination of the pelvis was performed including evaluation of the uterus, ovaries, adnexal regions, and pelvic cul-de-sac. COMPARISON:  CT 02/22/2022, ultrasound 12/19/2021 FINDINGS: Uterus Measurements: 9.0 x 3.6 x 5.3 cm = volume: 88 mL. 1.4 cm calcification of the posterior uterine body near the fundus, likely reflecting a small calcified uterine fibroid. Endometrium Thickness: 11 mm. Heterogeneous appearance of the endometrial cavity in the lower uterine segment with trace fluid. Right ovary Not visualized. Left ovary Not visualized. Other findings: Layering debris  was noted within the urinary bladder lumen. IMPRESSION: 1. Abnormally thickened endometrium measuring up to 11 mm. Trace fluid within the endometrial canal at the lower uterine segment. Endometrial sampling is indicated to exclude carcinoma. 2. Nonvisualization of the bilateral ovaries. 3. Layering debris noted within the urinary bladder, which could be secondary to stasis possibly blood products. Correlate with urinalysis. Electronically Signed   By: Davina Poke D.O.   On: 02/23/2022 17:28        Scheduled Meds:  (feeding supplement) PROSource Plus  30 mL Oral TID BM   acetaminophen  500 mg Oral TID   allopurinol  100 mg Oral Daily   ascorbic acid  500 mg Oral BID   atorvastatin  40 mg Oral Daily   Chlorhexidine Gluconate Cloth  6 each Topical Daily   cholestyramine light  4 g Oral BID   fluticasone  2 spray Each Nare Daily   gabapentin  200 mg Oral TID   heparin injection (subcutaneous)  5,000 Units Subcutaneous Q8H   insulin aspart  0-6 Units Subcutaneous TID WC   insulin aspart  3 Units Subcutaneous TID WC   insulin glargine-yfgn  15 Units Subcutaneous Daily   loratadine  10 mg Oral Daily   metoprolol tartrate  25 mg Oral BID   midodrine  10 mg Oral BID WC   multivitamin  1 tablet Oral QHS   nutrition supplement (JUVEN)  1 packet Oral BID BM   omega-3 acid ethyl esters  1 g Oral Daily   pantoprazole  40 mg Oral Q0600   Ensure Max Protein  11 oz Oral BID   vancomycin variable dose per unstable renal function (pharmacist dosing)   Does not apply See admin instructions   Continuous Infusions:  cefTRIAXone (ROCEPHIN)  IV 2 g (02/25/22 1019)   metronidazole 500 mg (02/25/22 0812)   sodium thiosulfate 25 g in sodium chloride 0.9 % 200 mL Infusion for Calciphylaxis 25 g (02/24/22 1544)   vancomycin     vancomycin       LOS: 3 days       Sidney Ace, MD [facility]   If 7PM-7AM, please contact night-coverage  02/25/2022, 1:17 PM

## 2022-02-25 NOTE — Consult Note (Signed)
NAME: Theresa Warren  DOB: April 06, 1956  MRN: [id number]  Date/Time: 02/25/2022 4:38 PM  REQUESTING PROVIDER: Dr>Sreenath Subjective:  REASON FOR CONSULT: diarrhea r/o cdiff Calciphylaxis wounds ? Theresa Warren is a 66 y.o. 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 As he started having diarrhea yesterday nigt I am consulted to see her for wounds and possible cdiff Pt has been on senna for the past 4 days until yesterday Also gets prosure and endsure which can all cause diarrhea No pain abdomen No fever or chills CT abdomen endometrial thickening to 13 mm And buttock wounds Seen by surgeon and no debridement recommended She is getting Thiosulphate during dialysis   Past Medical History:  Diagnosis Date   CKD (chronic kidney disease), stage III ([place])    Diabetes ([place])    Hyperlipidemia    Hypertension    Obesity     Past Surgical History:  Procedure Laterality Date   ACHILLES TENDON REPAIR     ANKLE SURGERY     DIALYSIS/PERMA CATHETER INSERTION N/A 01/09/2022   Procedure: DIALYSIS/PERMA CATHETER INSERTION;  Surgeon: Katha Cabal, MD;  Location: [place] [facility];  Service: Cardiovascular;  Laterality: N/A;   INCISION AND DRAINAGE ABSCESS Bilateral 01/01/2022   Procedure: INCISION AND DRAINAGE ABSCESS-Sacral Decubitus;  Surgeon: Olean Ree, MD;  Location: [facility];  Service: General;  Laterality: Bilateral;   URETHRAL STRICTURE DILATATION      Social History   Socioeconomic History   Marital status: Divorced    Spouse  name: Not on file   Number of children: Not on file   Years of education: Not on file   Highest education level: Not on file  Occupational History   Occupation: Works [employer]  Tobacco Use   Smoking status: Never   Smokeless tobacco: Not on file  Substance and Sexual Activity   Alcohol use: Not Currently   Drug use: Not Currently   Sexual activity: Not Currently  Other Topics Concern   Not on file  Social History Narrative   Lives alone   Social Determinants of Health   Financial Resource Strain: Not on file  Food Insecurity: Not on file  Transportation Needs: Not on file  Physical Activity: Not on file  Stress: Not on file  Social Connections: Not on file  Intimate Partner Violence: Not on file    Family History  Problem Relation Age of Onset   Alzheimer's disease Father    Diabetes Father    CAD Father 18   CAD Other        Multliple maternal aunts and uncles with early onset heart disease   Lung cancer Sister    ALS Mother    Allergies  Allergen Reactions   Codeine Nausea And Vomiting    Other reaction(s): nausea and vomiting, vomiting   Penicillins Rash and Hives    Other reaction(s): systemic rash   Carvedilol     Other reaction(s): high sugar, headache, cough   Fluoxetine     Other reaction(s): took  in 1990 . made her worse.   Liraglutide     Other reaction(s): too nauseated.   Pioglitazone     Other reaction(s): edema and osteoporosis   Sulfa Antibiotics Swelling   Clindamycin Hcl Rash   Clindamycin/Lincomycin Rash   Dilaudid [Hydromorphone Hcl] Rash   I? Current Facility-Administered Medications  Medication Dose Route Frequency Provider Last Rate Last Admin   (feeding supplement) PROSource Plus liquid 30 mL  30 mL Oral TID BM Eugenie Filler, MD   30 mL at 02/25/22 1357   acetaminophen (TYLENOL) tablet 500 mg  500 mg Oral TID Eugenie Filler, MD   500 mg at 02/25/22 1552   acetaminophen (TYLENOL) tablet 650 mg  650 mg Oral Q6H PRN  Irene Pap N, DO       allopurinol (ZYLOPRIM) tablet 100 mg  100 mg Oral Daily Eugenie Filler, MD   100 mg at 02/25/22 0900   ascorbic acid (VITAMIN C) tablet 500 mg  500 mg Oral BID Eugenie Filler, MD   500 mg at 02/25/22 0901   atorvastatin (LIPITOR) tablet 40 mg  40 mg Oral Daily Irene Pap N, DO   40 mg at 02/25/22 0901   cefTRIAXone (ROCEPHIN) 2 g in sodium chloride 0.9 % 100 mL IVPB  2 g Intravenous Q24H Irene Pap N, DO 200 mL/hr at 02/25/22 1019 2 g at 02/25/22 1019   Chlorhexidine Gluconate Cloth 2 % PADS 6 each  6 each Topical Daily Kayleen Memos, DO   6 each at 02/25/22 1257   cholestyramine light (PREVALITE) packet 4 g  4 g Oral BID Ralene Muskrat B, MD   4 g at 02/25/22 1255   fluticasone (FLONASE) 50 MCG/ACT nasal spray 2 spray  2 spray Each Nare Daily Eugenie Filler, MD   2 spray at 02/25/22 0902   gabapentin (NEURONTIN) capsule 200 mg  200 mg Oral TID Irene Pap N, DO   200 mg at 02/25/22 1552   heparin injection 5,000 Units  5,000 Units Subcutaneous Q8H HallArchie Patten N, DO   5,000 Units at 02/25/22 1349   HYDROmorphone (DILAUDID) injection 1 mg  1 mg Intravenous Q4H PRN Eugenie Filler, MD   1 mg at 02/25/22 1115   insulin aspart (novoLOG) injection 0-6 Units  0-6 Units Subcutaneous TID WC Eugenie Filler, MD   1 Units at 02/24/22 1754   insulin aspart (novoLOG) injection 3 Units  3 Units Subcutaneous TID WC Eugenie Filler, MD   3 Units at 02/25/22 1255   insulin glargine-yfgn (SEMGLEE) injection 15 Units  15 Units Subcutaneous Daily Eugenie Filler, MD   15 Units at 02/25/22 1012   loratadine (CLARITIN) tablet 10 mg  10 mg Oral Daily Eugenie Filler, MD   10 mg at 02/25/22 0901   metoprolol tartrate (LOPRESSOR) tablet 25 mg  25 mg Oral BID Eugenie Filler, MD   25 mg at 02/25/22 0901   metroNIDAZOLE (FLAGYL) IVPB 500 mg  500 mg Intravenous Q12H Irene Pap N, DO 100 mL/hr at 02/25/22 0812 500 mg at 02/25/22 0812   midodrine (PROAMATINE)  tablet 10 mg  10 mg Oral BID WC Irene Pap N, DO   10 mg at 02/25/22 1443   multivitamin (RENA-VIT) tablet 1 tablet  1 tablet Oral QHS Eugenie Filler, MD   1 tablet at 02/24/22 2147   nutrition supplement (JUVEN) (JUVEN) powder packet 1 packet  1 packet Oral BID BM Eugenie Filler,  MD   1 packet at 02/25/22 1358   omega-3 acid ethyl esters (LOVAZA) capsule 1 g  1 g Oral Daily Eugenie Filler, MD   1 g at 02/25/22 0901   ondansetron (ZOFRAN) injection 4 mg  4 mg Intravenous Q6H PRN Ralene Muskrat B, MD   4 mg at 02/25/22 1021   oxyCODONE (Oxy IR/ROXICODONE) immediate release tablet 5 mg  5 mg Oral Q4H PRN Irene Pap N, DO   5 mg at 02/25/22 1555   pantoprazole (PROTONIX) EC tablet 40 mg  40 mg Oral Q0600 Eugenie Filler, MD   40 mg at 02/25/22 0541   polyethylene glycol (MIRALAX / GLYCOLAX) packet 17 g  17 g Oral Daily PRN Irene Pap N, DO       protein supplement (ENSURE MAX) liquid  11 oz Oral BID Eugenie Filler, MD   11 oz at 02/25/22 0904   psyllium (HYDROCIL/METAMUCIL) 1 packet  1 packet Oral Daily Ralene Muskrat B, MD   1 packet at 02/25/22 1552   sodium thiosulfate 25 g in sodium chloride 0.9 % 200 mL Infusion for Calciphylaxis  25 g Intravenous Q T,Th,Sa-HD Colon Flattery, NP 200 mL/hr at 02/24/22 1544 25 g at 02/24/22 1544   vancomycin variable dose per unstable renal function (pharmacist dosing)   Does not apply See admin instructions Kayleen Memos, DO         Abtx:  Anti-infectives (From admission, onward)    Start     Dose/Rate Route Frequency Ordered Stop   02/25/22 1100  vancomycin (VANCOCIN) IVPB 1000 mg/200 mL premix        1,000 mg 200 mL/hr over 60 Minutes Intravenous  Once 02/25/22 1000 02/25/22 1449   02/24/22 1500  vancomycin (VANCOCIN) IVPB 1000 mg/200 mL premix  Status:  Discontinued        1,000 mg 200 mL/hr over 60 Minutes Intravenous Once in dialysis 02/24/22 1338 02/25/22 1327   02/23/22 1000  cefTRIAXone (ROCEPHIN) 2 g in sodium  chloride 0.9 % 100 mL IVPB        2 g 200 mL/hr over 30 Minutes Intravenous Every 24 hours 02/22/22 1654     02/23/22 0200  metroNIDAZOLE (FLAGYL) IVPB 500 mg        500 mg 100 mL/hr over 60 Minutes Intravenous Every 12 hours 02/22/22 1654     02/22/22 1706  vancomycin variable dose per unstable renal function (pharmacist dosing)         Does not apply See admin instructions 02/22/22 1707     02/22/22 1700  vancomycin (VANCOCIN) IVPB 1000 mg/200 mL premix  Status:  Discontinued        1,000 mg 200 mL/hr over 60 Minutes Intravenous Every 48 hours 02/22/22 1654 02/22/22 1701   02/22/22 1400  metroNIDAZOLE (FLAGYL) IVPB 500 mg        500 mg 100 mL/hr over 60 Minutes Intravenous  Once 02/22/22 1357 02/22/22 1557   02/22/22 1030  vancomycin (VANCOREADY) IVPB 2000 mg/400 mL        2,000 mg 200 mL/hr over 120 Minutes Intravenous  Once 02/22/22 1030 02/22/22 1332   02/22/22 1015  vancomycin (VANCOCIN) IVPB 1000 mg/200 mL premix  Status:  Discontinued        1,000 mg 200 mL/hr over 60 Minutes Intravenous  Once 02/22/22 1011 02/22/22 1030   02/22/22 1015  cefTRIAXone (ROCEPHIN) 2 g in sodium chloride 0.9 % 100 mL IVPB  2 g 200 mL/hr over 30 Minutes Intravenous  Once 02/22/22 1011 02/22/22 1126       REVIEW OF SYSTEMS:  Const: negative fever, negative chills, negative weight loss Eyes: negative diplopia or visual changes, negative eye pain ENT: negative coryza, negative sore throat Resp: negative cough, hemoptysis, dyspnea Cards: negative for chest pain, palpitations, lower extremity edema GU: negative for frequency, dysuria and hematuria NA:[id number] Skin: negative for rash and pruritus Heme: negative for easy bruising and gum/nose bleeding MS: general weakness Neurolo:negative for headaches, dizziness, vertigo, memory problems  Psych: anxiety, depression  Endocrine:  diabetes Allergy/Immunology- as above ?  Objective:  VITALS:  BP (!) 149/61 (BP Location: Right Arm)   Pulse  75   Temp 98.3 F (36.8 C)   Resp 16   Ht 5\' 2"  (1.575 m)   Wt 113.4 kg   SpO2 100%   BMI 45.73 kg/m   PHYSICAL EXAM:  General: Alert, some distress due to dressing changes,  Head: Normocephalic, without obvious abnormality, atraumatic. Eyes: Conjunctivae clear, anicteric sclerae. Pupils are equal ENT Nares normal. No drainage or sinus tenderness. Lips, mucosa, and tongue normal. No Thrush Neck: symmetrical, no adenopathy, thyroid: non tender no carotid bruit and no JVD. Back: No CVA tenderness. Lungs: Clear to auscultation bilaterally. No Wheezing or Rhonchi. No rales. Heart: Regular rate and rhythm, no murmur, rub or gallop. Abdomen: Soft, non-tender,not distended. Bowel sounds normal. No masses Extremities:             Lymph: Cervical, supraclavicular normal. Neurologic:did not assess Pertinent Labs Lab Results CBC    Component Value Date/Time   WBC 17.2 (H) 02/25/2022 0606   RBC 3.11 (L) 02/25/2022 0606   HGB 8.7 (L) 02/25/2022 0606   HCT 27.8 (L) 02/25/2022 0606   PLT 305 02/25/2022 0606   MCV 89.4 02/25/2022 0606   MCH 28.0 02/25/2022 0606   MCHC 31.3 02/25/2022 0606   RDW 18.2 (H) 02/25/2022 0606   LYMPHSABS 1.5 02/25/2022 0606   MONOABS 0.9 02/25/2022 0606   EOSABS 0.6 (H) 02/25/2022 0606   BASOSABS 0.1 02/25/2022 0606       Latest Ref Rng & Units 02/25/2022    6:06 AM 02/24/2022    4:14 AM 02/23/2022    4:17 AM  CMP  Glucose 70 - 99 mg/dL 96  302  110   BUN 8 - 23 mg/dL 34  40  27   Creatinine 0.44 - 1.00 mg/dL 3.55  4.64  3.41   Sodium 135 - 145 mmol/L 139  135  136   Potassium 3.5 - 5.1 mmol/L 3.5  4.3  4.5   Chloride 98 - 111 mmol/L 100  101  102   CO2 22 - 32 mmol/L 21  22  20    Calcium 8.9 - 10.3 mg/dL 8.8  8.8  8.3   Total Protein 6.5 - 8.1 g/dL   5.2   Total Bilirubin 0.3 - 1.2 mg/dL   1.0   Alkaline Phos 38 - 126 U/L   145   AST 15 - 41 U/L   18   ALT 0 - 44 U/L   15       Microbiology: Recent Results (from the past 240  hour(s))  Blood culture (routine x 2)     Status: None (Preliminary result)   Collection Time: 02/22/22 10:29 AM   Specimen: BLOOD  Result Value Ref Range Status   Specimen Description BLOOD RIGHT ANTECUBITAL  Final   Special Requests   Final  BOTTLES DRAWN AEROBIC AND ANAEROBIC Blood Culture results may not be optimal due to an inadequate volume of blood received in culture bottles   Culture   Final    NO GROWTH 3 DAYS Performed at [facility], [address] [place], [place] [address]    Report Status PENDING  Incomplete  Blood culture (routine x 2)     Status: None (Preliminary result)   Collection Time: 02/22/22 10:29 AM   Specimen: BLOOD  Result Value Ref Range Status   Specimen Description BLOOD Blood Culture adequate volume  Final   Special Requests   Final    BOTTLES DRAWN AEROBIC AND ANAEROBIC LEFT ANTECUBITAL   Culture   Final    NO GROWTH 3 DAYS Performed at [facility], [address], [place], [place] [address]    Report Status PENDING  Incomplete    IMAGING RESULTS: Ct abdomen- nodular liver I have personally reviewed the films ? Impression/Recommendation ? Diarrhea- combination of senna, prosure and ensure- Her last dose of senna was yesterday CT abdomen shows no colon wall thickening Watch closely - if it persist will check for cdiff   Leucocytosis due to wounds   Multiple necrotic tender wounds in the lower extremitis due to calciphylaxis- During her last stay a biopsy was performed from one of the wounds and it did not show any calciphylaxis- I discussed with [facility] unofficially as they dont consult on  inpatient . Calciphylaxis is the etiology Would recommend taking her to Derm office from her inpatient stay  Currently on vanco/ceftriaxone  Endometrial carcinoma need to r/o due to post menopausal bleeding 2 months ago and increased thickness of the stripe-  Recommend getting Gyn involved this admission  Anemia-  h/o PRBC in the past  ?DM  Management as per primary team ESRD on dilaysis  Recommend Palliative to address treatment goals ___________________________________________________ Discussed with patient and her nurses

## 2022-02-25 NOTE — Progress Notes (Signed)
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  Patient presents to the emergency department after suffering a mechanical fall at nursing facility.  Patient has been admitted for ESRD (end stage renal disease) ([place]) [N18.6] Wound of buttock, unspecified laterality, initial encounter [S31.809A] Sepsis ([place]) [A41.9] Sepsis, due to unspecified organism, unspecified whether acute organ dysfunction present [facility]) [A41.9]  Patient is known to our practice from previous admissions and receives outpatient dialysis treatment at [facility] on a TTS schedule, supervised by Dr. Candiss Norse.    Patient seen resting, drowsy Continues to complain of pain and soreness from sacral wounds.  Denies nausea and vomiting Denies shortness of breath Mild lower extremity edema  Objective:  Vital signs in last 24 hours:  Temp:  [98 F (36.7 C)-99.3 F (37.4 C)] 99.3 F (37.4 C) (07/26 0803) Pulse Rate:  [68-88] 88 (07/26 0803) Resp:  [15-20] 16 (07/26 0803) BP: (117-158)/(53-85) 148/69 (07/26 0803) SpO2:  [99 %-100 %] 100 % (07/26 0803)  Weight change:  Filed Weights   02/22/22 0934  Weight: 113.4 kg    Intake/Output: I/O last 3 completed shifts: In: 38 [P.O.:420; Blood:388; IV Piggyback:600] Out: 2.1 [Other:2.1]   Intake/Output this shift:  Total I/O In: 240 [P.O.:240] Out: -   Physical Exam: General: NAD, sitting up in bed  Head: Normocephalic, atraumatic. Moist oral mucosal membranes  Eyes: Anicteric  Lungs:  Clear to auscultation, normal effort, room air  Heart: Regular rate and rhythm  Abdomen:  Soft, nontender, obese  Extremities: 1+ peripheral edema.  Neurologic: Nonfocal, moving all four extremities  Skin: No lesions  Access: Right chest  PermCath    Basic Metabolic Panel: Recent Labs  Lab 02/22/22 0930 02/23/22 0417 02/24/22 0414 02/25/22 0606  NA 138 136 135 139  K 4.3 4.5 4.3 3.5  CL 100 102 101 100  CO2 23 20* 22 21*  GLUCOSE 242* 110* 302* 96  BUN 23 27* 40* 34*  CREATININE 2.97* 3.41* 4.64* 3.55*  CALCIUM 9.0 8.3* 8.8* 8.8*  MG  --  1.8  --   --   PHOS  --  3.0 3.7 2.8     Liver Function Tests: Recent Labs  Lab 02/22/22 0930 02/23/22 0417 02/24/22 0414 02/25/22 0606  AST 23 18  --   --   ALT 20 15  --   --   ALKPHOS 190* 145*  --   --   BILITOT 1.1 1.0  --   --   PROT 6.7 5.2*  --   --   ALBUMIN 2.4* 1.8* 1.9* 2.1*    No results for input(s): "LIPASE", "AMYLASE" in the last 168 hours. No results for input(s): "AMMONIA" in the last 168 hours.  CBC: Recent Labs  Lab 02/22/22 0930 02/23/22 0740 02/23/22 1459 02/24/22 0414 02/25/22 0606  WBC 21.5* 20.8*  --  14.4* 17.2*  NEUTROABS  --  14.9*  --  10.9* 13.8*  HGB 7.9* 7.1* 6.5* 6.6* 8.7*  HCT 26.6* 24.0* 21.5* 21.1* 27.8*  MCV 92.4 93.4  --  92.5 89.4  PLT 447* 346  --  333 305     Cardiac Enzymes: Recent Labs  Lab 02/22/22 0930  CKTOTAL 30*     BNP: Invalid input(s): "POCBNP"  CBG: Recent Labs  Lab 02/24/22 0824 02/24/22 1248 02/24/22 1742 02/24/22 2025  02/25/22 0801  GLUCAP 280* 287* 197* 164* 37     Microbiology: Results for orders placed or performed during the hospital encounter of 02/22/22  Blood culture (routine x 2)     Status: None (Preliminary result)   Collection Time: 02/22/22 10:29 AM   Specimen: BLOOD  Result Value Ref Range Status   Specimen Description BLOOD RIGHT ANTECUBITAL  Final   Special Requests   Final    BOTTLES DRAWN AEROBIC AND ANAEROBIC Blood Culture results may not be optimal due to an inadequate volume of blood received in culture bottles   Culture   Final    NO GROWTH 3 DAYS Performed at [facility], [address] [place], [place] [address]    Report Status  PENDING  Incomplete  Blood culture (routine x 2)     Status: None (Preliminary result)   Collection Time: 02/22/22 10:29 AM   Specimen: BLOOD  Result Value Ref Range Status   Specimen Description BLOOD Blood Culture adequate volume  Final   Special Requests   Final    BOTTLES DRAWN AEROBIC AND ANAEROBIC LEFT ANTECUBITAL   Culture   Final    NO GROWTH 3 DAYS Performed at [facility], [address], [place], [place] [address]    Report Status PENDING  Incomplete    Coagulation Studies: No results for input(s): "LABPROT", "INR" in the last 72 hours.  Urinalysis: No results for input(s): "COLORURINE", "LABSPEC", "PHURINE", "GLUCOSEU", "HGBUR", "BILIRUBINUR", "KETONESUR", "PROTEINUR", "UROBILINOGEN", "NITRITE", "LEUKOCYTESUR" in the last 72 hours.  Invalid input(s): "APPERANCEUR"    Imaging: DG Ribs Unilateral Left  Result Date: 02/24/2022 CLINICAL DATA:  Left rib pain. EXAM: LEFT RIBS - 2 VIEW COMPARISON:  None Available. FINDINGS: Seventh anterior rib fracture noted. This is also noted on prior abdominal CT scan 02/22/2022 in peers subacute/healing. IMPRESSION: Seventh anterior rib fracture appears subacute/healing. Electronically Signed   By: Marijo Sanes M.D.   On: 02/24/2022 11:05   DG Ribs Unilateral W/Chest Right  Result Date: 02/24/2022 CLINICAL DATA:  Right rib pain. EXAM: RIGHT RIBS AND CHEST - 3+ VIEW COMPARISON:  Multiple prior chest x-rays. FINDINGS: The right IJ dialysis catheter is stable. The heart is stable in size. Stable tortuosity and ectasia of the thoracic aorta. Streaky left basilar scarring changes. No pleural effusion or pneumothorax. Healing right posterior rib fractures, 5 through 8. IMPRESSION: 1. Healing right posterior rib fractures, 5 through 8. 2. No acute cardiopulmonary findings. Electronically Signed   By: Marijo Sanes M.D.   On: 02/24/2022 11:00   US PELVIS (TRANSABDOMINAL ONLY)  Result Date: 02/23/2022 CLINICAL DATA:  Endometrial  thickening seen on CT EXAM: TRANSABDOMINAL ULTRASOUND OF PELVIS TECHNIQUE: Transabdominal ultrasound examination of the pelvis was performed including evaluation of the uterus, ovaries, adnexal regions, and pelvic cul-de-sac. COMPARISON:  CT 02/22/2022, ultrasound 12/19/2021 FINDINGS: Uterus Measurements: 9.0 x 3.6 x 5.3 cm = volume: 88 mL. 1.4 cm calcification of the posterior uterine body near the fundus, likely reflecting a small calcified uterine fibroid. Endometrium Thickness: 11 mm. Heterogeneous appearance of the endometrial cavity in the lower uterine segment with trace fluid. Right ovary Not visualized. Left ovary Not visualized. Other findings: Layering debris was noted within the urinary bladder lumen. IMPRESSION: 1. Abnormally thickened endometrium measuring up to 11 mm. Trace fluid within the endometrial canal at the lower uterine segment. Endometrial sampling is indicated to exclude carcinoma. 2. Nonvisualization of the bilateral ovaries. 3. Layering debris noted within the urinary bladder, which could be secondary to  stasis possibly blood products. Correlate with urinalysis. Electronically Signed   By: Davina Poke D.O.   On: 02/23/2022 17:28     Medications:    cefTRIAXone (ROCEPHIN)  IV 2 g (02/25/22 1019)   metronidazole 500 mg (02/25/22 1572)   sodium thiosulfate 25 g in sodium chloride 0.9 % 200 mL Infusion for Calciphylaxis 25 g (02/24/22 1544)   vancomycin     vancomycin      (feeding supplement) PROSource Plus  30 mL Oral TID BM   acetaminophen  500 mg Oral TID   allopurinol  100 mg Oral Daily   ascorbic acid  500 mg Oral BID   atorvastatin  40 mg Oral Daily   Chlorhexidine Gluconate Cloth  6 each Topical Daily   fluticasone  2 spray Each Nare Daily   gabapentin  200 mg Oral TID   heparin injection (subcutaneous)  5,000 Units Subcutaneous Q8H   insulin aspart  0-6 Units Subcutaneous TID WC   insulin aspart  3 Units Subcutaneous TID WC   insulin glargine-yfgn  15 Units  Subcutaneous Daily   loratadine  10 mg Oral Daily   metoprolol tartrate  25 mg Oral BID   midodrine  10 mg Oral BID WC   multivitamin  1 tablet Oral QHS   nutrition supplement (JUVEN)  1 packet Oral BID BM   omega-3 acid ethyl esters  1 g Oral Daily   pantoprazole  40 mg Oral Q0600   Ensure Max Protein  11 oz Oral BID   senna-docusate  1 tablet Oral QHS   vancomycin variable dose per unstable renal function (pharmacist dosing)   Does not apply See admin instructions   acetaminophen, HYDROmorphone (DILAUDID) injection, ondansetron (ZOFRAN) IV, oxyCODONE, polyethylene glycol  Assessment/ Plan:  [name] is a 66 y.o.  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  Patient presents to the emergency department after suffering a mechanical fall at nursing facility.  Patient has been admitted for ESRD (end stage renal disease) ([place]) [N18.6] Wound of buttock, unspecified laterality, initial encounter [S31.809A] Sepsis ([place]) [A41.9] Sepsis, due to unspecified organism, unspecified whether acute organ dysfunction present ([place]) [A41.9]  [facility]S/Rt Permcath  End-stage renal disease on hemodialysis.  Will maintain outpatient schedule if possible.    Patient received dialysis yesterday, 2 L achieved.  Next treatment scheduled for tomorrow.  Renal navigator aware of patient and monitoring discharge plan to assess outpatient needs.  2. Anemia of chronic kidney disease Lab Results  Component Value Date   HGB 8.7 (L) 02/25/2022   Hemoglobin below goal.  Patient receives Mircera biweekly outpatient.  We will continue to monitor.  3. Secondary Hyperparathyroidism:  Lab Results  Component Value Date   PTH 23 01/15/2022   CALCIUM 8.8 (L) 02/25/2022   CAION 1.11 (L) 01/01/2022   PHOS 2.8 02/25/2022   .  We will continue to  monitor bone minerals during this admission.  4.  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  Patient will receive sodium thiosulfate with dialysis treatments.  Continue nutritional supplement, Juven.  5. Diabetes mellitus type II with chronic kidney disease: insulin dependent. Most recent hemoglobin A1c is 10.0 on 12/18/21.     LOS: 3   7/26/202311:18 AM

## 2022-02-25 NOTE — Plan of Care (Signed)
  Problem: Coping: Goal: Ability to adjust to condition or change in health will improve Outcome: Progressing   Problem: Fluid Volume: Goal: Ability to maintain a balanced intake and output will improve Outcome: Progressing   Problem: Metabolic: Goal: Ability to maintain appropriate glucose levels will improve Outcome: Progressing   

## 2022-02-25 NOTE — Progress Notes (Signed)
Nutrition Follow-up  DOCUMENTATION CODES:   Obesity unspecified  INTERVENTION:   -Continue renal MVI daily -Continue 500 mg vitamin C BID -Continue 30 ml Prosource Plus TID, each supplement provides 100 kcals and 15 grams protein -Continue 1 packet Juven BID, each packet provides 95 calories, 2.5 grams of protein (collagen), and 9.8 grams of carbohydrate (3 grams sugar); also contains 7 grams of L-arginine and L-glutamine, 300 mg vitamin C, 15 mg vitamin E, 1.2 mcg vitamin B-12, 9.5 mg zinc, 200 mg calcium, and 1.5 g  Calcium Beta-hydroxy-Beta-methylbutyrate to support wound healing  -Continue double protein portions with meals -Monitor labs: vitamin C, zinc, and copper -Continue Ensure Max BID -Liberalize diet to 2 gram sodium for wider variety of meal selections  NUTRITION DIAGNOSIS:   Increased nutrient needs related to wound healing as evidenced by estimated needs.  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  Reviewed I/O's: +1.2 L x 24 hours and +1.7 L since admission   Spoke with pt at bedside, who reports feeling a little better today. She was eating a bowl of cereal at time of visit. Per pt, she shares that her appetite comes and goes and has been becoming tired of the food options available to her ("too many fancy chicken dishes"). Pt prefers simple meals like sandwiches and hamburgers and she admits to not eating much protein at the facility. Noted  meal completions 75-100%.  Pt shares that she is taking nutritional supplements and likes the Ensure Max best (ordered by MD yesterday). Discussed importance of good meal and supplement intake to promote wound healing.   Medications reviewed and include senokot.   Labs reviewed: K and Phos WDL. CBGS: 91-197 (inpatient orders for glycemic control are 0-6 units insulin aspart TID with meals, 3 units insulin aspart TID with meals, and 15 units insulin glargine-yfgn daily).    NUTRITION - FOCUSED PHYSICAL EXAM:  Flowsheet Row Most Recent Value  Orbital Region No depletion  Upper Arm Region No depletion  Thoracic and Lumbar Region No depletion  Buccal Region No depletion  Temple Region No depletion  Clavicle Bone Region No depletion  Clavicle and Acromion Bone Region No depletion  Scapular Bone Region No depletion  Dorsal Hand No depletion  Patellar Region No depletion  Anterior Thigh Region No depletion  Posterior Calf Region No depletion  Edema (RD Assessment) Mild  Hair Reviewed  Eyes Reviewed  Mouth Reviewed  Skin Reviewed  Nails Reviewed       Diet Order:   Diet Order             Diet renal with fluid restriction Fluid restriction: 1200 mL Fluid; Room service appropriate? Yes; Fluid consistency: Thin  Diet effective now                   EDUCATION NEEDS:   No education needs have been identified at this time  Skin:  Skin Assessment: Skin Integrity Issues: Skin Integrity Issues:: Unstageable DTI: - Stage II: - Stage III: lt buttocks Stage IV: rt and lt  buttocks Unstageable: lt hip, rt hip, lt upper thigh, lt posterior thigh, rt lateral thigh Wound Vac: - Other: non-pressure wound to lt tibia  Last BM:  02/25/22 (type 6)  Height:   Ht Readings from Last 1 Encounters:  02/22/22 5\' 2"  (1.575 m)    Weight:   Wt Readings from Last 1 Encounters:  02/22/22 113.4 kg    Ideal Body Weight:  50 kg  BMI:  Body mass index is 45.73 kg/m.  Estimated  Nutritional Needs:   Kcal:  1800-2000  Protein:  110-125 grams  Fluid:  1000 ml + UOP    Loistine Chance, RD, LDN, [place] Registered Dietitian II Certified Diabetes Care and Education Specialist Please refer to [facility] for RD and/or RD on-call/weekend/after hours pager

## 2022-02-25 NOTE — Progress Notes (Addendum)
Occupational Therapy Treatment Patient Details Name: Vincent Ehrler MRN: [id number] DOB: [birth year] Today's Date: 02/25/2022   History of present illness Pt is a 66 y.o. female from [place] who comes in with a fall.  Patient reports that she had mechanical fall out of bed where she slipped off the air mattress and was on the ground for 3 hours where she could not get up. MD assessment includes: sepsis secondary to necrotic sacral decubitus wounds, anemia, and physical deconditioning with fall.  PMH significant for chronic anemia. hyperlipidemia, hypertension, CKD stage V, insulin-dependent diabetes mellitus.   OT comments  Pt. performed bilateral UE there. Ex. for strengthening needed to prepare for ADLs, and functional mobility. Pt. Reports having had wound care performed earlier, however was agreeable to UE there. Ex. Pt. Performed AROM with the left shoulder reaching up for flexion, and abduction reps. Pt. completed right elbow flexion, and bilateral elbow extension exercises with yellow theraband.  Pt. Presented with nausea, and vomiting during the session. Pt. Was assisted with care, and hand hygiene following. Pt. Was assisted with repositioning for comfort with modA. Nursing was notified of Pt. Nausea and vomiting. Pt. Education was provided about general reacher use.  Pt. Continues to  benefit from OT services for ADL training, A/E training, UE there. Ex,  and pt./caregiver Education about home modification, and DME.    Recommendations for follow up therapy are one component of a multi-disciplinary discharge planning process, led by the attending physician.  Recommendations may be updated based on patient status, additional functional criteria and insurance authorization.    Follow Up Recommendations  Skilled nursing-short term rehab (<3 hours/day)    Assistance Recommended at Discharge Frequent or constant Supervision/Assistance  Patient can return home with the  following  A lot of help with bathing/dressing/bathroom;Assist for transportation;Help with stairs or ramp for entrance;Two people to help with walking and/or transfers   Equipment Recommendations       Recommendations for Other Services      Precautions / Restrictions Precautions Precautions: Fall Restrictions Weight Bearing Restrictions: No Other Position/Activity Restrictions: History of falling with staff present, careful       Mobility Bed Mobility               General bed mobility comments: ModA repositioning    Transfers                         Balance                                           ADL either performed or assessed with clinical judgement   ADL                                         General ADL Comments: MinA UB, MaxA LE ADLs.    Extremity/Trunk Assessment Upper Extremity Assessment Upper Extremity Assessment: Generalized weakness            Vision Patient Visual Report: No change from baseline     Perception     Praxis      Cognition Arousal/Alertness: Awake/alert Behavior During Therapy: Flat affect Overall Cognitive Status: No family/caregiver present to determine baseline cognitive functioning Area of Impairment: Attention, Following commands, Awareness, Safety/judgement, Problem solving  Orientation Level: Disoriented to Current Attention Level: Selective   Following Commands: Follows one step commands with increased time, Follows one step commands inconsistently Safety/Judgement: Decreased awareness of safety, Decreased awareness of deficits Awareness: Anticipatory Problem Solving: Slow processing, Decreased initiation, Difficulty sequencing, Requires verbal cues, Requires tactile cues          Exercises      Shoulder Instructions       General Comments      Pertinent Vitals/ Pain       Pain Assessment Pain Assessment: 0-10 (Pt. reports  wound care pain, Not rated) Pain Intervention(s): Monitored during session, Repositioned, Limited activity within patient's tolerance  Home Living                                          Prior Functioning/Environment              Frequency  Min 2X/week        Progress Toward Goals  OT Goals(current goals can now be found in the care plan section)  Progress towards OT goals: Progressing toward goals  Acute Rehab OT Goals Patient Stated Goal: To return home OT Goal Formulation: With patient Time For Goal Achievement: 03/09/22 Potential to Achieve Goals: [place] Discharge plan remains appropriate;Frequency remains appropriate    Co-evaluation                 AM-PAC OT "6 Clicks" Daily Activity     Outcome Measure   Help from another person eating meals?: None Help from another person taking care of personal grooming?: A Little   Help from another person bathing (including washing, rinsing, drying)?: A Lot Help from another person to put on and taking off regular upper body clothing?: A Little Help from another person to put on and taking off regular lower body clothing?: A Lot 6 Click Score: 14    End of Session    OT Visit Diagnosis: Other abnormalities of gait and mobility (R26.89);Muscle weakness (generalized) (M62.81);Pain   Activity Tolerance Other (comment) (Limited by nausea, and vomiting)   Patient Left in bed;with call bell/phone within reach;with bed alarm set   Nurse Communication          Time: 1315-1350 OT Time Calculation (min): 35 min  Charges: OT General Charges $OT Visit: 1 Visit OT Treatments $Self Care/Home Management : 23-37 mins  Harrel Carina, MS, OTR/L   Harrel Carina 02/25/2022, 2:15 PM

## 2022-02-26 DIAGNOSIS — Z7189 Other specified counseling: Secondary | ICD-10-CM | POA: Diagnosis not present

## 2022-02-26 DIAGNOSIS — R197 Diarrhea, unspecified: Secondary | ICD-10-CM | POA: Diagnosis not present

## 2022-02-26 DIAGNOSIS — N186 End stage renal disease: Secondary | ICD-10-CM | POA: Diagnosis not present

## 2022-02-26 DIAGNOSIS — S31829A Unspecified open wound of left buttock, initial encounter: Secondary | ICD-10-CM | POA: Diagnosis not present

## 2022-02-26 DIAGNOSIS — A419 Sepsis, unspecified organism: Secondary | ICD-10-CM | POA: Diagnosis not present

## 2022-02-26 LAB — BASIC METABOLIC PANEL
Anion gap: 19 — ABNORMAL HIGH (ref 5–15)
BUN: 46 mg/dL — ABNORMAL HIGH (ref 8–23)
CO2: 18 mmol/L — ABNORMAL LOW (ref 22–32)
Calcium: 8.7 mg/dL — ABNORMAL LOW (ref 8.9–10.3)
Chloride: 98 mmol/L (ref 98–111)
Creatinine, Ser: 4.4 mg/dL — ABNORMAL HIGH (ref 0.44–1.00)
GFR, Estimated: 11 mL/min — ABNORMAL LOW (ref >60)
Glucose, Bld: 166 mg/dL — ABNORMAL HIGH (ref 70–99)
Potassium: 3.9 mmol/L (ref 3.5–5.1)
Sodium: 135 mmol/L (ref 135–145)

## 2022-02-26 LAB — GLUCOSE, CAPILLARY
Glucose-Capillary: 163 mg/dL — ABNORMAL HIGH (ref 70–99)
Glucose-Capillary: 133 mg/dL — ABNORMAL HIGH (ref 70–99)
Glucose-Capillary: 133 mg/dL — ABNORMAL HIGH (ref 70–99)

## 2022-02-26 LAB — CBC WITH DIFFERENTIAL/PLATELET
Abs Immature Granulocytes: 0.21 10*3/uL — ABNORMAL HIGH (ref 0.00–0.07)
Basophils Absolute: 0 10*3/uL (ref 0.0–0.1)
Basophils Relative: 0 %
Eosinophils Absolute: 0.2 10*3/uL (ref 0.0–0.5)
Eosinophils Relative: 3 %
HCT: 28.3 % — ABNORMAL LOW (ref 36.0–46.0)
Hemoglobin: 8.7 g/dL — ABNORMAL LOW (ref 12.0–15.0)
Immature Granulocytes: 2 %
Lymphocytes Relative: 15 %
Lymphs Abs: 1.5 10*3/uL (ref 0.7–4.0)
MCH: 28.1 pg (ref 26.0–34.0)
MCHC: 30.7 g/dL (ref 30.0–36.0)
MCV: 91.3 fL (ref 80.0–100.0)
Monocytes Absolute: 0.2 10*3/uL (ref 0.1–1.0)
Monocytes Relative: 2 %
Neutro Abs: 7.6 10*3/uL (ref 1.7–7.7)
Neutrophils Relative %: 78 %
Platelets: 318 10*3/uL (ref 150–400)
RBC: 3.1 MIL/uL — ABNORMAL LOW (ref 3.87–5.11)
RDW: 18.4 % — ABNORMAL HIGH (ref 11.5–15.5)
WBC: 9.8 10*3/uL (ref 4.0–10.5)
nRBC: 0 % (ref 0.0–0.2)

## 2022-02-26 LAB — COPPER, SERUM: Copper: 91 ug/dL (ref 80–158)

## 2022-02-26 LAB — VANCOMYCIN, RANDOM: Vancomycin Rm: 22 ug/mL

## 2022-02-26 LAB — ZINC: Zinc: 68 ug/dL (ref 44–115)

## 2022-02-26 MED ORDER — HEPARIN SODIUM (PORCINE) 1000 UNIT/ML IJ SOLN
INTRAMUSCULAR | Status: AC
  Administered 2022-02-26: 1000 [IU]
  Filled 2022-02-26: qty 5

## 2022-02-26 MED ORDER — CEFAZOLIN SODIUM-DEXTROSE 1-4 GM/50ML-% IV SOLN
1.0000 g | Freq: Once | INTRAVENOUS | Status: AC
Start: 2022-02-27 — End: 2022-02-27
  Administered 2022-02-27: 1 g via INTRAVENOUS
  Filled 2022-02-26: qty 50

## 2022-02-26 MED ORDER — MIDODRINE HCL 5 MG PO TABS
ORAL_TABLET | ORAL | Status: AC
  Filled 2022-02-26: qty 1

## 2022-02-26 MED ORDER — METRONIDAZOLE 500 MG/100ML IV SOLN
500.0000 mg | Freq: Two times a day (BID) | INTRAVENOUS | Status: DC
Start: 2022-02-26 — End: 2022-03-02
  Administered 2022-02-26 – 2022-03-01 (×7): 500 mg via INTRAVENOUS
  Filled 2022-02-26 (×8): qty 100

## 2022-02-26 MED ORDER — VANCOMYCIN HCL IN DEXTROSE 1-5 GM/200ML-% IV SOLN
1000.0000 mg | INTRAVENOUS | Status: DC
  Administered 2022-02-26: 1000 mg via INTRAVENOUS
  Filled 2022-02-26: qty 200

## 2022-02-26 MED ORDER — CEFAZOLIN SODIUM-DEXTROSE 1-4 GM/50ML-% IV SOLN: 1.0000 g | INTRAVENOUS | Status: DC

## 2022-02-26 MED ORDER — OXYCODONE HCL 5 MG PO TABS: 5.0000 mg | ORAL_TABLET | Freq: Four times a day (QID) | ORAL | Status: DC | PRN

## 2022-02-26 MED ORDER — MIDODRINE HCL 5 MG PO TABS
5.0000 mg | ORAL_TABLET | Freq: Two times a day (BID) | ORAL | Status: DC
  Administered 2022-02-26 – 2022-02-27 (×3): 5 mg via ORAL
  Filled 2022-02-26 (×3): qty 1

## 2022-02-26 MED ORDER — FENTANYL 12 MCG/HR TD PT72
1.0000 | MEDICATED_PATCH | TRANSDERMAL | Status: DC
  Administered 2022-02-26: 1 via TRANSDERMAL
  Filled 2022-02-26: qty 1

## 2022-02-26 MED ORDER — CEFAZOLIN SODIUM-DEXTROSE 1-4 GM/50ML-% IV SOLN
1.0000 g | INTRAVENOUS | Status: DC
Start: 2022-02-28 — End: 2022-03-02
  Administered 2022-02-28 – 2022-03-01 (×2): 1 g via INTRAVENOUS
  Filled 2022-02-26 (×3): qty 50

## 2022-02-26 MED ORDER — OXYCODONE HCL 5 MG PO TABS
5.0000 mg | ORAL_TABLET | Freq: Four times a day (QID) | ORAL | Status: DC | PRN
  Administered 2022-02-26 – 2022-03-02 (×9): 5 mg via ORAL
  Filled 2022-02-26 (×13): qty 1

## 2022-02-26 NOTE — Consult Note (Addendum)
Pharmacy Antibiotic Note  Theresa Warren is a 66 y.o. female admitted on 02/22/2022 with sepsis. Patient with multiple chronic wounds. Concern for infection and necrosis.  Calciphylaxis in differential per notes (biopsy in June was not consistent with this but curbside discussion with dermatology recommended treating as if calciphylaxis until further evaluation).  Pharmacy has been consulted for Vancomycin dosing.  ESRD with HD on Tu,Th and Sat  Vancomycin level 7/27 am = 29mcg/mL Given vancomycin 2gm 7/23 Given vancomycin 1gm 7/26   Plan: Based on random vancomycin level this morning and plan for HD today, start vancomycin 1000mg  after each HD session on TTS Follow-up pre-HD prior to 3rd-4th dose FOllow-up LOT  Height: 5\' 2"  (157.5 cm) Weight: 101.9 kg (224 lb 10.4 oz) IBW/kg (Calculated) : 50.1  Temp (24hrs), Avg:98.4 F (36.9 C), Min:98.1 F (36.7 C), Max:99 F (37.2 C)  Recent Labs  Lab 02/22/22 0930 02/22/22 0932 02/23/22 0417 02/23/22 0740 02/24/22 0414 02/25/22 0606 02/26/22 0410  WBC 21.5*  --   --  20.8* 14.4* 17.2*  --   CREATININE 2.97*  --  3.41*  --  4.64* 3.55*  --   LATICACIDVEN  --  1.5  --   --   --   --   --   VANCORANDOM  --   --   --   --   --   --  22     Estimated Creatinine Clearance: 17.9 mL/min (A) (by C-G formula based on SCr of 3.55 mg/dL (H)).    Allergies  Allergen Reactions   Codeine Nausea And Vomiting    Other reaction(s): nausea and vomiting, vomiting   Penicillins Rash and Hives    Other reaction(s): systemic rash   Carvedilol     Other reaction(s): high sugar, headache, cough   Fluoxetine     Other reaction(s): took in 1990 . made her worse.   Liraglutide     Other reaction(s): too nauseated.   Pioglitazone     Other reaction(s): edema and osteoporosis   Sulfa Antibiotics Swelling   Clindamycin Hcl Rash   Clindamycin/Lincomycin Rash   Dilaudid [Hydromorphone Hcl] Rash    Antimicrobials this admission: 7/23  Ceftriaxone >>  7/23 Metronidazole >>  7/23 Vancomycin >>  Dose adjustments this admission: ESRD  Microbiology results: 7/23 BCx: NGTD  Thank you for allowing pharmacy to be a part of this patient's care.  Doreene Eland, PharmD, BCPS, BCIDP Work Cell: [phone] 02/26/2022 11:03 AM

## 2022-02-26 NOTE — Progress Notes (Signed)
ID Pt is doing better Says pain better controlled with fentanyl No fever  O/e awake and alert BP 123/61 (BP Location: Right Arm)   Pulse (!) 102   Temp 98.3 F (36.8 C)   Resp 16   Ht 5\' 2"  (1.575 m)   Wt 102.1 kg   SpO2 100%   BMI 41.17 kg/m   Chest b/l air entry HS s1s2 Abd soft Skin lower extremities Multiple area sof necosis B/l buttock area Upper thigh left > rt Left calf All covered with eschar          Rectal tube has around 50 cc of stool Abd soft Cns non focal  Labs    Latest Ref Rng & Units 02/26/2022   11:04 AM 02/25/2022    6:06 AM 02/24/2022    4:14 AM  CBC  WBC 4.0 - 10.5 K/uL 9.8  17.2  14.4   Hemoglobin 12.0 - 15.0 g/dL 8.7  8.7  6.6   Hematocrit 36.0 - 46.0 % 28.3  27.8  21.1   Platelets 150 - 400 K/uL 318  305  333        Latest Ref Rng & Units 02/26/2022   11:04 AM 02/25/2022    6:06 AM 02/24/2022    4:14 AM  CMP  Glucose 70 - 99 mg/dL 166  96  302   BUN 8 - 23 mg/dL 46  34  40   Creatinine 0.44 - 1.00 mg/dL 4.40  3.55  4.64   Sodium 135 - 145 mmol/L 135  139  135   Potassium 3.5 - 5.1 mmol/L 3.9  3.5  4.3   Chloride 98 - 111 mmol/L 98  100  101   CO2 22 - 32 mmol/L 18  21  22    Calcium 8.9 - 10.3 mg/dL 8.7  8.8  8.8      Impression /recommendation Diarrhea- much improved since stopping lactulose Very minimal in rectal tube With resolution on leucocytosis and no other symptoms this is not cdiff- no testing needed and enteric precaution can be removed  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 involving fat Eventhough last biopsy was negative , I showed hte pictures to [facility] and 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

## 2022-02-26 NOTE — Progress Notes (Signed)
Received patient in bed, alert and oriented. Informed consent signed and in chart.  Time tx completed: 1442  HD treatment completed. Patient tolerated well. Fistula/Graft/HD catheter without signs and symptoms of complications. Patient transported back to the room, alert and orient and in no acute distress. Report given to bedside RN.  Total UF removed: 1.4L  Medication given: Sodium Thiosulfate, Dilauded  Post HD VS: 93/53 (66), HR-88, RR-16, SP02-99, 98.7 temp  Post HD weight: 102.1kg  Pts treatment was cut 30 minutes short due to low blood pressures.

## 2022-02-26 NOTE — Progress Notes (Addendum)
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  Patient presents to the emergency department after suffering a mechanical fall at nursing facility.  Patient has been admitted for ESRD (end stage renal disease) ([place]) [N18.6] Wound of buttock, unspecified laterality, initial encounter [S31.809A] Sepsis ([place]) [A41.9] Sepsis, due to unspecified organism, unspecified whether acute organ dysfunction present [facility]) [A41.9]  Patient is known to our practice from previous admissions and receives outpatient dialysis treatment at [facility] on a TTS schedule, supervised by Dr. Candiss Norse.    Patient seen sitting up in bed  Tearful, just completed dressing change   Objective:  Vital signs in last 24 hours:  Temp:  [97.7 F (36.5 C)-99 F (37.2 C)] 97.7 F (36.5 C) (07/27 1056) Pulse Rate:  [41-88] 83 (07/27 1300) Resp:  [16-23] 16 (07/27 1300) BP: (101-159)/(59-88) 101/84 (07/27 1300) SpO2:  [99 %-100 %] 100 % (07/27 1300) Weight:  [101.9 kg] 101.9 kg (07/27 1048)  Weight change:  Filed Weights   02/22/22 0934 02/26/22 1048  Weight: 113.4 kg 101.9 kg    Intake/Output: I/O last 3 completed shifts: In: 2126 [P.O.:720; Blood:388; IV [id number]:9509] Out: -    Intake/Output this shift:  No intake/output data recorded.  Physical Exam: General: NAD, sitting up in bed  Head: Normocephalic, atraumatic. Moist oral mucosal membranes  Eyes: Anicteric  Lungs:  Clear to auscultation, normal effort, room air  Heart: Regular rate and rhythm  Abdomen:  Soft, nontender, obese  Extremities: 1+ peripheral edema.  Neurologic: Nonfocal, moving all four extremities  Skin: No lesions  Access: Right chest PermCath    Basic Metabolic Panel: Recent Labs  Lab  02/22/22 0930 02/23/22 0417 02/24/22 0414 02/25/22 0606 02/26/22 1104  NA 138 136 135 139 135  K 4.3 4.5 4.3 3.5 3.9  CL 100 102 101 100 98  CO2 23 20* 22 21* 18*  GLUCOSE 242* 110* 302* 96 166*  BUN 23 27* 40* 34* 46*  CREATININE 2.97* 3.41* 4.64* 3.55* 4.40*  CALCIUM 9.0 8.3* 8.8* 8.8* 8.7*  MG  --  1.8  --   --   --   PHOS  --  3.0 3.7 2.8  --      Liver Function Tests: Recent Labs  Lab 02/22/22 0930 02/23/22 0417 02/24/22 0414 02/25/22 0606  AST 23 18  --   --   ALT 20 15  --   --   ALKPHOS 190* 145*  --   --   BILITOT 1.1 1.0  --   --   PROT 6.7 5.2*  --   --   ALBUMIN 2.4* 1.8* 1.9* 2.1*    No results for input(s): "LIPASE", "AMYLASE" in the last 168 hours. No results for input(s): "AMMONIA" in the last 168 hours.  CBC: Recent Labs  Lab 02/22/22 0930 02/23/22 0740 02/23/22 1459 02/24/22 0414 02/25/22 0606 02/26/22 1104  WBC 21.5* 20.8*  --  14.4* 17.2* 9.8  NEUTROABS  --  14.9*  --  10.9* 13.8* 7.6  HGB 7.9* 7.1* 6.5* 6.6* 8.7* 8.7*  HCT 26.6* 24.0* 21.5* 21.1* 27.8* 28.3*  MCV 92.4 93.4  --  92.5 89.4 91.3  PLT 447* 346  --  333 305 318     Cardiac Enzymes: Recent Labs  Lab 02/22/22 0930  CKTOTAL 30*  BNP: Invalid input(s): "POCBNP"  CBG: Recent Labs  Lab 02/25/22 1648 02/25/22 2112 02/25/22 2141 02/25/22 2221 02/26/22 0833  GLUCAP 121* 69* 69* 107* 163*     Microbiology: Results for orders placed or performed during the hospital encounter of 02/22/22  Blood culture (routine x 2)     Status: None (Preliminary result)   Collection Time: 02/22/22 10:29 AM   Specimen: BLOOD  Result Value Ref Range Status   Specimen Description BLOOD RIGHT ANTECUBITAL  Final   Special Requests   Final    BOTTLES DRAWN AEROBIC AND ANAEROBIC Blood Culture results may not be optimal due to an inadequate volume of blood received in culture bottles   Culture   Final    NO GROWTH 4 DAYS Performed at [facility], [address]  [place], [place] [address]    Report Status PENDING  Incomplete  Blood culture (routine x 2)     Status: None (Preliminary result)   Collection Time: 02/22/22 10:29 AM   Specimen: BLOOD  Result Value Ref Range Status   Specimen Description BLOOD Blood Culture adequate volume  Final   Special Requests   Final    BOTTLES DRAWN AEROBIC AND ANAEROBIC LEFT ANTECUBITAL   Culture   Final    NO GROWTH 4 DAYS Performed at [facility], [address] [place], [place] [address]    Report Status PENDING  Incomplete  Gastrointestinal Panel by PCR , Stool     Status: None   Collection Time: 02/25/22  5:50 PM   Specimen: Stool  Result Value Ref Range Status   Campylobacter species NOT DETECTED NOT DETECTED Final   Plesimonas shigelloides NOT DETECTED NOT DETECTED Final   Salmonella species NOT DETECTED NOT DETECTED Final   Yersinia enterocolitica NOT DETECTED NOT DETECTED Final   Vibrio species NOT DETECTED NOT DETECTED Final   Vibrio cholerae NOT DETECTED NOT DETECTED Final   Enteroaggregative E coli (EAEC) NOT DETECTED NOT DETECTED Final   Enteropathogenic E coli (EPEC) NOT DETECTED NOT DETECTED Final   Enterotoxigenic E coli (ETEC) NOT DETECTED NOT DETECTED Final   Shiga like toxin producing E coli (STEC) NOT DETECTED NOT DETECTED Final   Shigella/Enteroinvasive E coli (EIEC) NOT DETECTED NOT DETECTED Final   Cryptosporidium NOT DETECTED NOT DETECTED Final   Cyclospora cayetanensis NOT DETECTED NOT DETECTED Final   Entamoeba histolytica NOT DETECTED NOT DETECTED Final   Giardia lamblia NOT DETECTED NOT DETECTED Final   Adenovirus F40/41 NOT DETECTED NOT DETECTED Final   Astrovirus NOT DETECTED NOT DETECTED Final   Norovirus GI/GII NOT DETECTED NOT DETECTED Final   Rotavirus A NOT DETECTED NOT DETECTED Final   Sapovirus (I, II, IV, and V) NOT DETECTED NOT DETECTED Final    Comment: Performed at [facility], [address] [place], [place] [address]    Coagulation  Studies: No results for input(s): "LABPROT", "INR" in the last 72 hours.  Urinalysis: No results for input(s): "COLORURINE", "LABSPEC", "PHURINE", "GLUCOSEU", "HGBUR", "BILIRUBINUR", "KETONESUR", "PROTEINUR", "UROBILINOGEN", "NITRITE", "LEUKOCYTESUR" in the last 72 hours.  Invalid input(s): "APPERANCEUR"    Imaging: DG Abd 1 View  Result Date: 02/25/2022 CLINICAL DATA:  Diarrhea.  Abdominal pain. EXAM: ABDOMEN - 1 VIEW COMPARISON:  CT 02/22/2022 FINDINGS: Bowel gas pattern does not suggest ileus or obstruction. Moderate amount of stool in the rectum. Vascular calcification is noted. No acute bone finding. IMPRESSION: Unremarkable bowel gas pattern. Moderate amount of fecal matter in the rectum. Electronically Signed   By: Elta Guadeloupe  Shogry M.D.   On: 02/25/2022 13:45     Medications:    cefTRIAXone (ROCEPHIN)  IV 2 g (02/26/22 1013)   metronidazole 500 mg (02/26/22 0837)   sodium thiosulfate 25 g in sodium chloride 0.9 % 200 mL Infusion for Calciphylaxis 25 g (02/24/22 1544)   vancomycin      (feeding supplement) PROSource Plus  30 mL Oral TID BM   allopurinol  100 mg Oral Daily   ascorbic acid  500 mg Oral BID   atorvastatin  40 mg Oral Daily   Chlorhexidine Gluconate Cloth  6 each Topical Daily   cholestyramine light  4 g Oral BID   fluticasone  2 spray Each Nare Daily   gabapentin  200 mg Oral TID   heparin injection (subcutaneous)  5,000 Units Subcutaneous Q8H   insulin aspart  0-6 Units Subcutaneous TID WC   insulin aspart  3 Units Subcutaneous TID WC   insulin glargine-yfgn  15 Units Subcutaneous Daily   loratadine  10 mg Oral Daily   metoprolol tartrate  25 mg Oral BID   midodrine  5 mg Oral BID WC   multivitamin  1 tablet Oral QHS   nutrition supplement (JUVEN)  1 packet Oral BID BM   omega-3 acid ethyl esters  1 g Oral Daily   pantoprazole  40 mg Oral Q0600   Ensure Max Protein  11 oz Oral BID   psyllium  1 packet Oral Daily   acetaminophen, HYDROmorphone (DILAUDID)  injection, ondansetron (ZOFRAN) IV, oxyCODONE, polyethylene glycol  Assessment/ Plan:  Ms. Mckaylin Bastien is a 66 y.o.  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  Patient presents to the emergency department after suffering a mechanical fall at nursing facility.  Patient has been admitted for ESRD (end stage renal disease) ([place]) [N18.6] Wound of buttock, unspecified laterality, initial encounter [S31.809A] Sepsis ([place]) [A41.9] Sepsis, due to unspecified organism, unspecified whether acute organ dysfunction present ([place]) [A41.9]  [facility]/TTS/Rt Permcath  End-stage renal disease on hemodialysis.  Will maintain outpatient schedule if possible.    Scheduled to receive dialysis today, UF goal 1L as tolerated. Next treatment scheduled for Saturday. Patient remains tearful during treatment, unable to lay back to allow proper function of catheter. Given PRN Dilaudid, patient able to relax. Will monitor progress but due to pain, may not complete full treatment.   Renal navigator aware of patient and monitoring discharge plan to assess outpatient needs.  2. Anemia of chronic kidney disease Lab Results  Component Value Date   HGB 8.7 (L) 02/26/2022   Patient receives Mircera biweekly outpatient.  We will continue to monitor.  3. Secondary Hyperparathyroidism:  Lab Results  Component Value Date   PTH 23 01/15/2022   CALCIUM 8.7 (L) 02/26/2022   CAION 1.11 (L) 01/01/2022   PHOS 2.8 02/25/2022   .  We will continue to monitor bone minerals during this admission.  4.  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  Patient will receive sodium thiosulfate with dialysis treatments.  Continue nutritional supplement, Juven.  5. Diabetes mellitus type II with chronic kidney disease: insulin dependent. Most recent hemoglobin A1c is 10.0  on 12/18/21.     LOS: 4 Shantelle Breeze 7/27/20231:04 PM   Patient was examined and evaluated with Colon Flattery, NP.  Plan of care was formulated and discussed with patient as well as NP.  I agree with the note  as documented. 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

## 2022-02-26 NOTE — Consult Note (Addendum)
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  Patient presents to the emergency department after suffering a mechanical fall at nursing   Clinical Assessment and Goals of Care: Notes and labs reviewed. Patient is known to PMT service from previous admission.   Patient is in dialysis. Spoke with nephrology who discusses pain from a dressing change prior to initiating HD. They state she has ended sessions early outpatient. They confirm she is using her gel pad during dialysis. She sits for 5 hour sessions.    Spoke with patient. She tells me she has pain at her wound sites as her dressings were changed too close to dialysis. She states she ends sessions early outpatient due to pain usually, but has ended it early for nausea.    We discussed her diagnosis, prognosis, GOC, EOL wishes disposition and options.  Created space and opportunity for patient  to explore thoughts and feelings regarding current medical information.   A detailed discussion was had today regarding advanced directives.  Concepts specific to code status, artifical feeding and hydration, IV antibiotics and rehospitalization were discussed.  The difference between an aggressive medical intervention path and a comfort care path was discussed.  Values and goals of care important to patient and family were attempted to be elicited.  Discussed limitations of medical interventions to prolong quality  of life in some situations and discussed the concept of human mortality.  She would like to continue current care.   Last admission, a Fentanyl patch was started for pain management as patient is a dialysis patient. Would recommend resuming this and continuing on D/C with a goal of weaning off oral narcotic therapy. I have discussed this recommendation and plan with nephrology, and with primary team MD in detail.    Recommend outpatient palliative.   SUMMARY OF RECOMMENDATIONS    Recommend outpatient palliative. Continue current care.   Last admission patient had significant pain in hips at wound sites and a  Fentanyl patch was initiated. For pain, would recommend reinitiating a Fentanyl patch as patient is a dialysis patient.   Spoke with attending, Fentanyl patch started at 69mcg. It will likely need to be increased to 46mcg. IV Dilaudid stopped. PRN Oxycodone from q4 hours PRN to q6 PRN.         Primary Diagnoses: Present on Admission:  Sepsis ([place])  Chronic pain syndrome  Anemia of renal disease  Severe obesity (BMI >= 40) (HCC)  ESRD (end stage renal disease) ([place])   I have reviewed the medical record, interviewed the patient and family, and examined the patient. The following aspects are pertinent.  Past Medical History:  Diagnosis Date  CKD (chronic kidney disease), stage III (HCC)    Diabetes ([place])    Hyperlipidemia    Hypertension    Obesity    Social History   Socioeconomic History   Marital status: Divorced    Spouse name: Not on file   Number of children: Not on file   Years of education: Not on file   Highest education level: Not on file  Occupational History   Occupation: Works [employer]  Tobacco Use   Smoking status: Never   Smokeless tobacco: Not on file  Substance and Sexual Activity   Alcohol use: Not Currently   Drug use: Not Currently   Sexual activity: Not Currently  Other Topics Concern   Not on file  Social History Narrative    Lives alone   Social Determinants of Health   Financial Resource Strain: Not on file  Food Insecurity: Not on file  Transportation Needs: Not on file  Physical Activity: Not on file  Stress: Not on file  Social Connections: Not on file   Family History  Problem Relation Age of Onset   Alzheimer's disease Father    Diabetes Father    CAD Father 8   CAD Other        Multliple maternal aunts and uncles with early onset heart disease   Lung cancer Sister    ALS Mother    Scheduled Meds:  (feeding supplement) PROSource Plus  30 mL Oral TID BM   allopurinol  100 mg Oral Daily   ascorbic acid  500 mg Oral BID   atorvastatin  40 mg Oral Daily   Chlorhexidine Gluconate Cloth  6 each Topical Daily   cholestyramine light  4 g Oral BID   fluticasone  2 spray Each Nare Daily   gabapentin  200 mg Oral TID   heparin injection (subcutaneous)  5,000 Units Subcutaneous Q8H   insulin aspart  0-6 Units Subcutaneous TID WC   insulin aspart  3 Units Subcutaneous TID WC   insulin glargine-yfgn  15 Units Subcutaneous Daily   loratadine  10 mg Oral Daily   metoprolol tartrate  25 mg Oral BID   midodrine  5 mg Oral BID WC   multivitamin  1 tablet Oral QHS   nutrition supplement (JUVEN)  1 packet Oral BID BM   omega-3 acid ethyl esters  1 g Oral Daily   pantoprazole  40 mg Oral Q0600   Ensure Max Protein  11 oz Oral BID   psyllium  1 packet Oral Daily   Continuous Infusions:  cefTRIAXone (ROCEPHIN)  IV 2 g (02/26/22 1013)   metronidazole 500 mg (02/26/22 0837)   sodium thiosulfate 25 g in sodium chloride 0.9 % 200 mL Infusion for Calciphylaxis 25 g (02/24/22 1544)   vancomycin     PRN Meds:.acetaminophen, HYDROmorphone (DILAUDID) injection, ondansetron (ZOFRAN) IV, oxyCODONE, polyethylene glycol Medications Prior to Admission:  Prior to Admission medications   Medication Sig Start Date End Date Taking? Authorizing Provider  acetaminophen (TYLENOL) 325 MG tablet Take 975 mg by mouth every  6 (six) hours as needed for mild pain.   Yes [provider]  allopurinol (ZYLOPRIM) 100 MG tablet Take 100 mg by mouth daily.   Yes [provider]  ascorbic acid (VITAMIN C) 500 MG tablet Take 1 tablet (500 mg total) by mouth 2 (two) times daily. 02/10/22  Yes Enzo Bi, MD  atorvastatin (LIPITOR) 40 MG tablet Take 40 mg by mouth daily.   Yes [provider]  cetirizine (ZYRTEC) 10 MG tablet Take 10 mg by mouth daily.   Yes [provider]  fluticasone (FLONASE) 50 MCG/ACT nasal spray Place 2 sprays into both nostrils daily. 11/27/14 02/22/22 Yes Mungal, Vishal, MD  gabapentin (NEURONTIN) 100 MG capsule Take 2 capsules (200 mg total) by mouth 3 (three) times daily. 02/10/22  Yes Enzo Bi, MD  insulin glargine-yfgn (SEMGLEE) 100 UNIT/ML injection Inject 0.15 mLs (15 Units total) into the skin at bedtime. 02/10/22  Yes Enzo Bi, MD  insulin lispro (HUMALOG) 100 UNIT/ML injection Inject 3 Units into the skin 4 (four) times daily -  before meals and at bedtime.   Yes [provider]  metoprolol tartrate (LOPRESSOR) 25 MG tablet Take 1 tablet (25 mg total) by mouth 2 (two) times daily. 12/23/21  Yes Sharen Hones, MD  multivitamin (RENA-VIT) TABS tablet Take 1 tablet by mouth at bedtime. 02/10/22  Yes Enzo Bi, MD  nystatin (MYCOSTATIN/NYSTOP) powder Apply topically. 10/28/21  Yes [provider]  Omega-3 Fatty Acids (FISH OIL) 500 MG CAPS Take by mouth.   Yes [provider]  oxyCODONE (OXY IR/ROXICODONE) 5 MG immediate release tablet Take 1 tablet (5 mg total) by mouth every 6 (six) hours as needed for moderate pain or severe pain (can give 10 mg 30 minutes before dressing change). 02/10/22  Yes Enzo Bi, MD  polyethylene glycol (MIRALAX / GLYCOLAX) 17 g packet Take 17 g by mouth daily. 02/11/22  Yes Enzo Bi, MD  senna-docusate (SENOKOT-S) 8.6-50 MG tablet Take 1 tablet by mouth 2 (two) times daily. 02/10/22  Yes Enzo Bi, MD  Ensure Max  Protein (ENSURE MAX PROTEIN) LIQD Take 330 mLs (11 oz total) by mouth 2 (two) times daily. 02/10/22   Enzo Bi, MD  epoetin alfa (EPOGEN) 10000 UNIT/ML injection Inject 1 mL (10,000 Units total) into the vein every Monday, Wednesday, and Friday with hemodialysis. 02/11/22   Enzo Bi, MD  fexofenadine (ALLEGRA) 180 MG tablet Take 180 mg by mouth daily. Patient not taking: Reported on 02/22/2022    [provider]  insulin aspart (NOVOLOG) 100 UNIT/ML injection Inject 3 Units into the skin 3 (three) times daily with meals. Patient not taking: Reported on 02/22/2022 02/10/22   Enzo Bi, MD  nutrition supplement, JUVEN, Fanny Dance) PACK Take 1 packet by mouth 2 (two) times daily between meals. 02/10/22   Enzo Bi, MD  sodium chloride 0.9 % SOLN 100 mL with sodium thiosulfate 250 MG/ML SOLN 25 g Inject 25 g into the vein every Monday, Wednesday, and Friday with hemodialysis. 02/10/22   Enzo Bi, MD   Allergies  Allergen Reactions   Codeine Nausea And Vomiting    Other reaction(s): nausea and vomiting, vomiting   Penicillins Rash and Hives    Other reaction(s): systemic rash   Carvedilol     Other reaction(s): high sugar, headache, cough   Fluoxetine     Other reaction(s): took in 1990 . made her worse.   Liraglutide     Other reaction(s): too nauseated.   Pioglitazone     Other reaction(s): edema and osteoporosis   Sulfa Antibiotics Swelling   Clindamycin Hcl Rash   Clindamycin/Lincomycin Rash   Dilaudid [Hydromorphone Hcl] Rash   Review of Systems  Constitutional:        Wound pain    Physical Exam Pulmonary:     Effort: Pulmonary effort is normal.  Neurological:     Mental Status: She is alert.     Vital Signs: BP  100/64   Pulse 88   Temp 97.7 F (36.5 C) (Oral)   Resp 18   Ht 5\' 2"  (1.575 m)   Wt 101.9 kg   SpO2 100%   BMI 41.09 kg/m  Pain Scale: 0-10 POSS *See Group Information*: 1-Acceptable,Awake and alert Pain Score: 10-Worst pain ever   SpO2: SpO2: 100  % O2 Device:SpO2: 100 % O2 Flow Rate: .   IO: Intake/output summary:  Intake/Output Summary (Last 24 hours) at 02/26/2022 1346 Last data filed at 02/25/2022 2341 Gross per 24 hour  Intake 1498 ml  Output --  Net 1498 ml    LBM: Last BM Date : 02/25/22 Baseline Weight: Weight: 113.4 kg Most recent weight: Weight: 101.9 kg       Signed by: Asencion Gowda, NP   Please contact Palliative Medicine Team phone at [phone] for questions and concerns.  For individual provider: See Shea Evans

## 2022-02-26 NOTE — Progress Notes (Signed)
Physical Therapy Treatment Patient Details Name: Theresa Warren MRN: [id number] DOB: 1955/08/25 Today's Date: 02/26/2022   History of Present Illness Pt is a 66 y.o. female from [place] who comes in with a fall.  Patient reports that she had mechanical fall out of bed where she slipped off the air mattress and was on the ground for 3 hours where she could not get up. MD assessment includes: sepsis secondary to necrotic sacral decubitus wounds, anemia, and physical deconditioning with fall.  PMH significant for chronic anemia. hyperlipidemia, hypertension, CKD stage V, insulin-dependent diabetes mellitus.    PT Comments    Pt was long sitting in bed upon arriving. Did not remember meeting author earlier in the day and is unaware of full scope of situation. She did agree to session and remained cooperative throughout. Pt required +1 assistance throughout most of session however second person present for safety and to perform dressing change/wound care while in standing. She rolled L to short sit with +1 max assist. Stood from elevated bed height 4 x with mod assist of one. Tolerated standing for ~ 1 minute each time while RN did wound care. Overall pt is progressing. Will need extensive PT going forward to maximize independence while decreasing care giver burden.    Recommendations for follow up therapy are one component of a multi-disciplinary discharge planning process, led by the attending physician.  Recommendations may be updated based on patient status, additional functional criteria and insurance authorization.  Follow Up Recommendations  Skilled nursing-short term rehab (<3 hours/day)     Assistance Recommended at Discharge Frequent or constant Supervision/Assistance  Patient can return home with the following Two people to help with walking and/or transfers;Two people to help with bathing/dressing/bathroom;Assistance with cooking/housework;Assist for transportation;Help  with stairs or ramp for entrance   Equipment Recommendations  None recommended by PT       Precautions / Restrictions Precautions Precautions: Fall Precaution Comments: sacral & leg wounds Restrictions Weight Bearing Restrictions: No Other Position/Activity Restrictions: History of falling with staff present, careful     Mobility  Bed Mobility Overal bed mobility: Needs Assistance Bed Mobility: Supine to Sit, Sit to Supine Rolling: Min assist, Mod assist, +2 for safety/equipment Sidelying to sit: Max assist, HOB elevated Supine to sit: +2 for safety/equipment, Max assist Sit to supine: +2 for physical assistance, Max assist   General bed mobility comments: pt was long sitting in bed upon arriving. She required +1 assistance to roll and to achieve short sit however pt needs +2 assistance only for safety.    Transfers Overall transfer level: Needs assistance Equipment used: Rolling walker (2 wheels) Transfers: Sit to/from Stand Sit to Stand: From elevated surface, Mod assist, +2 safety/equipment    General transfer comment: Pt was able to stand with +1 assist but 2nd person for safety. Pt stood EOB 4 x total. stood each trial for ~ 1-2 minutes while RN changed dressings/wound care performed    Ambulation/Gait    General Gait Details: pt fatigues qith static standing only. Needs constant vcs for posture + encouragement to stay static standing.    Balance Overall balance assessment: Needs assistance, History of Falls Sitting-balance support: Feet supported, Bilateral upper extremity supported Sitting balance-Leahy Scale: Fair     Standing balance support: Bilateral upper extremity supported, Reliant on assistive device for balance, During functional activity Standing balance-Leahy Scale: Poor Standing balance comment: pt tolerated static standing however due to anxiety and fatigue, did not progress away form EOB. Author does  feel she couls easily get to recliner if  willing      Cognition Arousal/Alertness: Awake/alert Behavior During Therapy: WFL for tasks assessed/performed Overall Cognitive Status: No family/caregiver present to determine baseline cognitive functioning Area of Impairment: Attention, Following commands, Awareness, Safety/judgement, Problem solving    Orientation Level: Disoriented to, Situation, Time Current Attention Level: Selective   Following Commands: Follows one step commands with increased time, Follows one step commands inconsistently Safety/Judgement: Decreased awareness of safety, Decreased awareness of deficits Awareness: Anticipatory Problem Solving: Slow processing, Decreased initiation, Difficulty sequencing, Requires verbal cues, Requires tactile cues General Comments: anxious, fearful of falling, needs a lot of encouragement, extra time needed occasionally with word finding               Pertinent Vitals/Pain Pain Assessment Pain Assessment: PAINAD Breathing: occasional labored breathing, short period of hyperventilation Negative Vocalization: occasional moan/groan, low speech, negative/disapproving quality Facial Expression: sad, frightened, frown Body Language: relaxed Consolability: distracted or reassured by voice/touch PAINAD Score: 4 Facial Expression: Grimacing Pain Location: BLE thighs and groin area Pain Descriptors / Indicators: Grimacing, Guarding, Moaning Pain Intervention(s): Limited activity within patient's tolerance, Monitored during session, Premedicated before session, Repositioned     PT Goals (current goals can now be found in the care plan section) Acute Rehab PT Goals Patient Stated Goal: get better Progress towards PT goals: Progressing toward goals    Frequency    Min 2X/week      PT Plan Current plan remains appropriate    Co-evaluation     PT goals addressed during session: Mobility/safety with mobility;Balance;Proper use of DME;Strengthening/ROM         AM-PAC PT "6 Clicks" Mobility   Outcome Measure  Help needed turning from your back to your side while in a flat bed without using bedrails?: A Little Help needed moving from lying on your back to sitting on the side of a flat bed without using bedrails?: A Lot Help needed moving to and from a bed to a chair (including a wheelchair)?: A Lot Help needed standing up from a chair using your arms (e.g., wheelchair or bedside chair)?: A Lot Help needed to walk in hospital room?: A Lot Help needed climbing 3-5 steps with a railing? : Total 6 Click Score: 12    End of Session   Activity Tolerance: Patient tolerated treatment well Patient left: in bed;with call bell/phone within reach;with bed alarm set Nurse Communication: Mobility status PT Visit Diagnosis: Unsteadiness on feet (R26.81);Muscle weakness (generalized) (M62.81);Difficulty in walking, not elsewhere classified (R26.2);History of falling (Z91.81);Pain     Time: 9379-0240 PT Time Calculation (min) (ACUTE ONLY): 35 min  Charges:  $Therapeutic Activity: 23-37 mins                     Julaine Fusi PTA 02/26/22, 11:49 AM

## 2022-02-26 NOTE — Progress Notes (Signed)
PROGRESS NOTE    Theresa Warren  WVP:[id number] DOB: 23-May-1956 DOA: 02/22/2022 PCP: Crist Infante, MD    Brief Narrative:  66 y.o. 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     The patient presents to [facility] ED from SNF via EMS due to a fall, she slid off her bed.  Imaging in the ED is reassuring, no fractures.  Noted to be tachycardic with leukocytosis greater than 21000 with concern for sepsis secondary to necrotic sacral decubitus wounds.  The patient was started on empiric broad-spectrum IV antibiotics, IV vancomycin, Rocephin and IV Flagyl.  EDP discussed the case with general surgery.  Patient underwent bedside debridement by general surgery.  I have not signed off   Assessment & Plan:   Principal Problem:   Sepsis ([place]) Active Problems:   Chronic pain syndrome   Anemia of renal disease   Type 2 diabetes mellitus with chronic kidney disease, with long-term current use of insulin (HCC)   Severe obesity (BMI >= 40) (HCC)   ESRD (end stage renal disease) (HCC)   Wound of buttock   Bilateral lower extremity edema   Anemia due to chronic kidney disease, on chronic dialysis ([place])   Hypertension  Sepsis secondary to necrotic sacral decubitus wounds, POA -Patient presented with criteria for sepsis with leukocytosis, tachycardia, sacral pain with concern for infected necrotic sacral decubitus wounds. -Status post bedside debridement by general surgery -Blood cultures no growth to date -Leukocytosis persistent Plan: Continue broad-spectrum IV antibiotics for now Multimodal pain control WOC follow-up Regular dressing changes ID  consulted 7/26  Acute diarrhea Noted initially on 7/25.  Bedside RN on 7/26 notes persistent watery diarrhea.  No fever.  Positive leukocytosis.  No abdominal cramping. Plan: At this time clinical suspicion for C. difficile is low.  Infectious disease consulted for comment.   KUB reassuring Attempted Questran, patient could not tolerate GI PCR panel negative If leukocytosis downtrending and diarrhea persists will order Imodium  End-stage renal disease on HD TTS Possible calciphylaxis Nephrology following patient while in house.  Concern for calciphylaxis.  Is on sodium thiosulfate  Chronic bilateral lower extremity edema -May be secondary to volume overload from end-stage renal disease. -Patient noted to have had bilateral lower extremity Doppler ultrasound on 01/12/2022 with no evidence of DVT. -Keep lower extremities elevated. -Nephrology consulted for hemodialysis with fluid removal as tolerated  Type 2 diabetes mellitus with hyperglycemia -Last hemoglobin A1c 10.0 on 12/18/2021.  Poor control -Continue Semglee 15 units daily.  NovoLog 3 units 3 times daily with meals.  SSI.   Anemia -Likely anemia of chronic disease secondary to end-stage renal disease. -Hemoglobin at 6.6 from 7.1 from 7.9 on admission, was 9.0 on 02/06/2022. -Patient with no overt bleeding. -Anemia panel consistent with anemia of chronic disease.   -Transfused 2 units packed red blood cells during hemodialysis 7/25.   -Follow-up H&H reassuring -Transfusion threshold hemoglobin < 7.  -If patient needs another transfusion will likely need to be given during HD.  Hyperlipidemia -Continue Lipitor.  Diabetic polyneuropathy/chronic pain -Continue home regimen Neurontin. -Continue home regiment pain medications  Physical deconditioning with fall -PT/OT. -Fall precautions. -Likely needs SNF placement. -TOC consult. -Palliative care consulted 7/27  Hypertension -Patient noted to have soft  blood pressures on  presentation. -BP improving.   -Continue to hold home oral antihypertensive medications and could likely start resuming tomorrow.   -Scheduled midodrine  Severe obesity/morbid obesity -Lifestyle modification. -Outpatient follow-up with PCP.   endometrial thickness -Noted on CT abdomen and pelvis with a endometrial canal measuring 13 mm thickness in the lower urinary segment. -Pelvic ultrasound with thickened endometrium measuring up to 11 mm.  Trace fluid within the endometrial canal at the lower uterine segment.  Endometrial sampling is indicated to exclude carcinoma.  Nonvisualization of bilateral ovaries.  Layering debris noted within the urinary bladder which could be secondary to stasis possibly blood products.  Correlate with urinalysis. -Phone consultation was done with OB/GYN, Dr. Rolly Salter who reviewed the charts, reviewed the pelvic ultrasound and recommended outpatient follow-up with GYN for endometrial biopsy. -See follow-up in discharge navigator.    DVT prophylaxis: SQ heparin Code Status: Full Family Communication: None today Disposition Plan: Status is: Inpatient Remains inpatient appropriate because: Sepsis on broad-spectrum IV antibiotics   Level of care: Med-Surg  Consultants:  ID  Procedures:  Bedside debridement of sacral ulcer 7/25  Antimicrobials: Vancomycin Cefepime Flagyl   Subjective: Seen and examined.  Resting comfortably in bed.  No visible distress.  Does endorse diarrhea.   Objective: Vitals:   02/25/22 2110 02/25/22 2120 02/26/22 0610 02/26/22 0912  BP: (!) 159/75 (!) 159/75 (!) 150/59 (!) 156/88  Pulse:  78 70 88  Resp:  20  16  Temp:  98.2 F (36.8 C) 99 F (37.2 C) 98.1 F (36.7 C)  TempSrc:   Axillary   SpO2:  100% 99% 99%  Weight:      Height:        Intake/Output Summary (Last 24 hours) at 02/26/2022 1033 Last data filed at 02/25/2022 2341 Gross per 24 hour  Intake 1498 ml  Output --  Net 1498 ml   Filed Weights    02/22/22 0934  Weight: 113.4 kg    Examination:  General exam: NAD.  Appears chronically ill.  Fatigued Respiratory system: Scattered fine crackles bilaterally.  Normal work of breathing.  Room air Cardiovascular system: S1-S2, RRR, no murmurs, 2+ pedal edema BLE Gastrointestinal system: Obese, soft, NT/ND, normal bowel sounds Central nervous system: Alert and oriented. No focal neurological deficits. Extremities: Symmetric 5 x 5 power. Skin: Sacral decubitus ulcers with areas of necrotic eschar Psychiatry: Judgement and insight appear normal. Mood & affect appropriate.     Data Reviewed: I have personally reviewed following labs and imaging studies  CBC: Recent Labs  Lab 02/22/22 0930 02/23/22 0740 02/23/22 1459 02/24/22 0414 02/25/22 0606  WBC 21.5* 20.8*  --  14.4* 17.2*  NEUTROABS  --  14.9*  --  10.9* 13.8*  HGB 7.9* 7.1* 6.5* 6.6* 8.7*  HCT 26.6* 24.0* 21.5* 21.1* 27.8*  MCV 92.4 93.4  --  92.5 89.4  PLT 447* 346  --  333 160   Basic Metabolic Panel: Recent Labs  Lab 02/22/22 0930 02/23/22 0417 02/24/22 0414 02/25/22 0606  NA 138 136 135 139  K 4.3 4.5 4.3 3.5  CL 100 102 101 100  CO2 23 20* 22 21*  GLUCOSE 242* 110* 302* 96  BUN 23 27* 40* 34*  CREATININE 2.97* 3.41* 4.64* 3.55*  CALCIUM 9.0 8.3* 8.8* 8.8*  MG  --  1.8  --   --   PHOS  --  3.0 3.7 2.8   GFR: Estimated Creatinine Clearance: 19.1 mL/min (A) (by C-G formula based on SCr  of 3.55 mg/dL (H)). Liver Function Tests: Recent Labs  Lab 02/22/22 0930 02/23/22 0417 02/24/22 0414 02/25/22 0606  AST 23 18  --   --   ALT 20 15  --   --   ALKPHOS 190* 145*  --   --   BILITOT 1.1 1.0  --   --   PROT 6.7 5.2*  --   --   ALBUMIN 2.4* 1.8* 1.9* 2.1*   No results for input(s): "LIPASE", "AMYLASE" in the last 168 hours. No results for input(s): "AMMONIA" in the last 168 hours. Coagulation Profile: No results for input(s): "INR", "PROTIME" in the last 168 hours. Cardiac Enzymes: Recent Labs   Lab 02/22/22 0930  CKTOTAL 30*   BNP (last 3 results) No results for input(s): "PROBNP" in the last 8760 hours. HbA1C: No results for input(s): "HGBA1C" in the last 72 hours. CBG: Recent Labs  Lab 02/25/22 1648 02/25/22 2112 02/25/22 2141 02/25/22 2221 02/26/22 0833  GLUCAP 121* 69* 69* 107* 163*   Lipid Profile: No results for input(s): "CHOL", "HDL", "LDLCALC", "TRIG", "CHOLHDL", "LDLDIRECT" in the last 72 hours. Thyroid Function Tests: No results for input(s): "TSH", "T4TOTAL", "FREET4", "T3FREE", "THYROIDAB" in the last 72 hours. Anemia Panel: Recent Labs    02/23/22 1038 02/23/22 1459  VITAMINB12  --  1,076*  FOLATE 12.6  --   FERRITIN 905*  --   TIBC NOT CALCULATED  --   IRON 21*  --    Sepsis Labs: Recent Labs  Lab 02/22/22 0932  LATICACIDVEN 1.5    Recent Results (from the past 240 hour(s))  Blood culture (routine x 2)     Status: None (Preliminary result)   Collection Time: 02/22/22 10:29 AM   Specimen: BLOOD  Result Value Ref Range Status   Specimen Description BLOOD RIGHT ANTECUBITAL  Final   Special Requests   Final    BOTTLES DRAWN AEROBIC AND ANAEROBIC Blood Culture results may not be optimal due to an inadequate volume of blood received in culture bottles   Culture   Final    NO GROWTH 4 DAYS Performed at [facility], [address] [place], [place] [address]    Report Status PENDING  Incomplete  Blood culture (routine x 2)     Status: None (Preliminary result)   Collection Time: 02/22/22 10:29 AM   Specimen: BLOOD  Result Value Ref Range Status   Specimen Description BLOOD Blood Culture adequate volume  Final   Special Requests   Final    BOTTLES DRAWN AEROBIC AND ANAEROBIC LEFT ANTECUBITAL   Culture   Final    NO GROWTH 4 DAYS Performed at [facility], [address] [place], [place] [address]    Report Status PENDING  Incomplete  Gastrointestinal Panel by PCR , Stool     Status: None   Collection Time:  02/25/22  5:50 PM   Specimen: Stool  Result Value Ref Range Status   Campylobacter species NOT DETECTED NOT DETECTED Final   Plesimonas shigelloides NOT DETECTED NOT DETECTED Final   Salmonella species NOT DETECTED NOT DETECTED Final   Yersinia enterocolitica NOT DETECTED NOT DETECTED Final   Vibrio species NOT DETECTED NOT DETECTED Final   Vibrio cholerae NOT DETECTED NOT DETECTED Final   Enteroaggregative E coli (EAEC) NOT DETECTED NOT DETECTED Final   Enteropathogenic E coli (EPEC) NOT DETECTED NOT DETECTED Final   Enterotoxigenic E coli (ETEC) NOT DETECTED NOT DETECTED Final   Shiga like toxin producing E coli (STEC) NOT  DETECTED NOT DETECTED Final   Shigella/Enteroinvasive E coli (EIEC) NOT DETECTED NOT DETECTED Final   Cryptosporidium NOT DETECTED NOT DETECTED Final   Cyclospora cayetanensis NOT DETECTED NOT DETECTED Final   Entamoeba histolytica NOT DETECTED NOT DETECTED Final   Giardia lamblia NOT DETECTED NOT DETECTED Final   Adenovirus F40/41 NOT DETECTED NOT DETECTED Final   Astrovirus NOT DETECTED NOT DETECTED Final   Norovirus GI/GII NOT DETECTED NOT DETECTED Final   Rotavirus A NOT DETECTED NOT DETECTED Final   Sapovirus (I, II, IV, and V) NOT DETECTED NOT DETECTED Final    Comment: Performed at [facility], [address] [place], [place] [address]         Radiology Studies: DG Abd 1 View  Result Date: 02/25/2022 CLINICAL DATA:  Diarrhea.  Abdominal pain. EXAM: ABDOMEN - 1 VIEW COMPARISON:  CT 02/22/2022 FINDINGS: Bowel gas pattern does not suggest ileus or obstruction. Moderate amount of stool in the rectum. Vascular calcification is noted. No acute bone finding. IMPRESSION: Unremarkable bowel gas pattern. Moderate amount of fecal matter in the rectum. Electronically Signed   By: Nelson Chimes M.D.   On: 02/25/2022 13:45        Scheduled Meds:  (feeding supplement) PROSource Plus  30 mL Oral TID BM   allopurinol  100 mg Oral Daily   ascorbic acid   500 mg Oral BID   atorvastatin  40 mg Oral Daily   Chlorhexidine Gluconate Cloth  6 each Topical Daily   cholestyramine light  4 g Oral BID   fluticasone  2 spray Each Nare Daily   gabapentin  200 mg Oral TID   heparin injection (subcutaneous)  5,000 Units Subcutaneous Q8H   insulin aspart  0-6 Units Subcutaneous TID WC   insulin aspart  3 Units Subcutaneous TID WC   insulin glargine-yfgn  15 Units Subcutaneous Daily   loratadine  10 mg Oral Daily   metoprolol tartrate  25 mg Oral BID   midodrine  5 mg Oral BID WC   multivitamin  1 tablet Oral QHS   nutrition supplement (JUVEN)  1 packet Oral BID BM   omega-3 acid ethyl esters  1 g Oral Daily   pantoprazole  40 mg Oral Q0600   Ensure Max Protein  11 oz Oral BID   psyllium  1 packet Oral Daily   vancomycin variable dose per unstable renal function (pharmacist dosing)   Does not apply See admin instructions   Continuous Infusions:  cefTRIAXone (ROCEPHIN)  IV 2 g (02/26/22 1013)   metronidazole 500 mg (02/26/22 0837)   sodium thiosulfate 25 g in sodium chloride 0.9 % 200 mL Infusion for Calciphylaxis 25 g (02/24/22 1544)     LOS: 4 days       Sidney Ace, MD [facility]   If 7PM-7AM, please contact night-coverage  02/26/2022, 10:33 AM

## 2022-02-26 NOTE — Progress Notes (Signed)
Patient was supposed to have all dressing changed done shortly after midnight, the patient requested the nurse to try again in the morning.    Nurse and NT present at the patients bedside to change the dressing and the patient refused.  As the Nurse and NT was attempting to position the patient to change the dressing the patient stated that she refused and resisted the position by using her weight to avoid the turn.  Nurse educated the patient and attempted to try again.  Patient then did the same thing and refused again and said to "go away".

## 2022-02-27 DIAGNOSIS — L97902 Non-pressure chronic ulcer of unspecified part of unspecified lower leg with fat layer exposed: Secondary | ICD-10-CM | POA: Diagnosis not present

## 2022-02-27 DIAGNOSIS — E119 Type 2 diabetes mellitus without complications: Secondary | ICD-10-CM

## 2022-02-27 DIAGNOSIS — R197 Diarrhea, unspecified: Secondary | ICD-10-CM | POA: Diagnosis not present

## 2022-02-27 DIAGNOSIS — N186 End stage renal disease: Secondary | ICD-10-CM | POA: Diagnosis not present

## 2022-02-27 DIAGNOSIS — Z794 Long term (current) use of insulin: Secondary | ICD-10-CM

## 2022-02-27 DIAGNOSIS — R6520 Severe sepsis without septic shock: Secondary | ICD-10-CM | POA: Diagnosis not present

## 2022-02-27 DIAGNOSIS — S31829D Unspecified open wound of left buttock, subsequent encounter: Secondary | ICD-10-CM

## 2022-02-27 DIAGNOSIS — N950 Postmenopausal bleeding: Secondary | ICD-10-CM | POA: Diagnosis not present

## 2022-02-27 DIAGNOSIS — A419 Sepsis, unspecified organism: Secondary | ICD-10-CM | POA: Diagnosis not present

## 2022-02-27 DIAGNOSIS — D649 Anemia, unspecified: Secondary | ICD-10-CM

## 2022-02-27 LAB — CULTURE, BLOOD (ROUTINE X 2)
Culture: NO GROWTH
Culture: NO GROWTH
Specimen Description: ADEQUATE

## 2022-02-27 LAB — GLUCOSE, CAPILLARY
Glucose-Capillary: 116 mg/dL — ABNORMAL HIGH (ref 70–99)
Glucose-Capillary: 90 mg/dL (ref 70–99)
Glucose-Capillary: 152 mg/dL — ABNORMAL HIGH (ref 70–99)
Glucose-Capillary: 125 mg/dL — ABNORMAL HIGH (ref 70–99)

## 2022-02-27 LAB — VITAMIN C: Vitamin C: 1.3 mg/dL (ref 0.4–2.0)

## 2022-02-27 MED ORDER — FENTANYL 25 MCG/HR TD PT72
1.0000 | MEDICATED_PATCH | TRANSDERMAL | Status: DC
  Administered 2022-02-27 – 2022-03-02 (×2): 1 via TRANSDERMAL
  Filled 2022-02-27 (×2): qty 1

## 2022-02-27 MED ORDER — EPOETIN ALFA 4000 UNIT/ML IJ SOLN
4000.0000 [IU] | INTRAMUSCULAR | Status: DC
  Administered 2022-02-28: 4000 [IU] via INTRAVENOUS
  Filled 2022-02-27: qty 1

## 2022-02-27 MED ORDER — SIMETHICONE 80 MG PO CHEW
80.0000 mg | CHEWABLE_TABLET | Freq: Four times a day (QID) | ORAL | Status: DC
  Administered 2022-02-27 – 2022-03-02 (×12): 80 mg via ORAL
  Filled 2022-02-27 (×15): qty 1

## 2022-02-27 MED ORDER — MIDODRINE HCL 5 MG PO TABS: 5.0000 mg | ORAL_TABLET | Freq: Two times a day (BID) | ORAL | Status: DC | PRN

## 2022-02-27 MED ORDER — ALUM & MAG HYDROXIDE-SIMETH 200-200-20 MG/5ML PO SUSP
30.0000 mL | ORAL | Status: DC | PRN
Start: 2022-02-27 — End: 2022-02-28

## 2022-02-27 NOTE — Progress Notes (Signed)
ID [Pain control has been an issue Fluctuating Today a bad day Diarrhea has almost resolved  O/e awake and alert BP 137/71 (BP Location: Right Arm)   Pulse 79   Temp 97.9 F (36.6 C)   Resp 16   Ht 5\' 2"  (1.575 m)   Wt 102.1 kg   SpO2 99%   BMI 41.17 kg/m   Chest b/l air entry HS s1s2 Abd soft Skin lower extremities Multiple area sof necosis B/l buttock area Upper thigh left > rt Left calf All covered with eschar          Rectal tube has around 20 cc of stool Abd soft Cns non focal  Labs    Latest Ref Rng & Units 02/26/2022   11:04 AM 02/25/2022    6:06 AM 02/24/2022    4:14 AM  CBC  WBC 4.0 - 10.5 K/uL 9.8  17.2  14.4   Hemoglobin 12.0 - 15.0 g/dL 8.7  8.7  6.6   Hematocrit 36.0 - 46.0 % 28.3  27.8  21.1   Platelets 150 - 400 K/uL 318  305  333        Latest Ref Rng & Units 02/26/2022   11:04 AM 02/25/2022    6:06 AM 02/24/2022    4:14 AM  CMP  Glucose 70 - 99 mg/dL 166  96  302   BUN 8 - 23 mg/dL 46  34  40   Creatinine 0.44 - 1.00 mg/dL 4.40  3.55  4.64   Sodium 135 - 145 mmol/L 135  139  135   Potassium 3.5 - 5.1 mmol/L 3.9  3.5  4.3   Chloride 98 - 111 mmol/L 98  100  101   CO2 22 - 32 mmol/L 18  21  22    Calcium 8.9 - 10.3 mg/dL 8.7  8.8  8.8      Impression /recommendation Diarrhea- almost resolved since stopping senna ( not lactulose as mentioned in my note from yesterday) With resolution on leucocytosis and no other symptoms this is not cdiff- no testing needed and enteric precaution can be removed  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 involving fat Eventhough last biopsy was negative , I showed hte pictures to [facility] and 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

## 2022-02-27 NOTE — Progress Notes (Signed)
Occupational Therapy Treatment Patient Details Name: Theresa Warren MRN: [id number] DOB: 01-May-1956 Today's Date: 02/27/2022   History of present illness Pt is a 66 y.o. female from [place] who comes in with a fall.  Patient reports that she had mechanical fall out of bed where she slipped off the air mattress and was on the ground for 3 hours where she could not get up. MD assessment includes: sepsis secondary to necrotic sacral decubitus wounds, anemia, and physical deconditioning with fall.  PMH significant for chronic anemia. hyperlipidemia, hypertension, CKD stage V, insulin-dependent diabetes mellitus.   OT comments  Pt is greeted in bed, agreeable to OT tx session. Pt is limited by reported pain on this date. RN and NT present for mobility/dressing change. Tx session targeted improving activity tolerance for eventual transfer to chair, in prep for improved independence in ADL tasks. Pt tearful throughout mobility, educated re; use of log roll to prevent further sheering on wounds performing with MAX A. Pt requires MAX A +2  with RW for 4 attempts in standing for dressing change, peri care with TOTAL A. Pt required return to supine for rolling with MIN A to complete bed change and dressing change. Pt is left as received, all needs met. Discharge recommendation remains appropriate.    Recommendations for follow up therapy are one component of a multi-disciplinary discharge planning process, led by the attending physician.  Recommendations may be updated based on patient status, additional functional criteria and insurance authorization.    Follow Up Recommendations  Skilled nursing-short term rehab (<3 hours/day)    Assistance Recommended at Discharge Frequent or constant Supervision/Assistance  Patient can return home with the following  A lot of help with bathing/dressing/bathroom;Assist for transportation;Help with stairs or ramp for entrance;Two people to help with  walking and/or transfers   Equipment Recommendations  Other (comment) (per next venue of care)    Recommendations for Other Services      Precautions / Restrictions Precautions Precautions: Fall Precaution Comments: sacral & leg wounds Restrictions Weight Bearing Restrictions: No Other Position/Activity Restrictions: history of falling with staff present       Mobility Bed Mobility Overal bed mobility: Needs Assistance Bed Mobility: Rolling, Sidelying to Sit, Sit to Supine Rolling: Min assist, +2 for safety/equipment Sidelying to sit: Max assist, HOB elevated   Sit to supine: +2 for physical assistance, +2 for safety/equipment, Max assist        Transfers Overall transfer level: Needs assistance Equipment used: Rolling walker (2 wheels) Transfers: Sit to/from Stand Sit to Stand: From elevated surface, Max assist, +2 safety/equipment, +2 physical assistance (4 attempts)           General transfer comment: Pt unable to tolerate upright standing for dressing change on this date requiring significant rest breaks and eventual return to supine to finish with rolling     Balance Overall balance assessment: Needs assistance, History of Falls Sitting-balance support: Feet supported, Bilateral upper extremity supported Sitting balance-Leahy Scale: Fair       Standing balance-Leahy Scale: Zero                             ADL either performed or assessed with clinical judgement   ADL Overall ADL's : Needs assistance/impaired                     Lower Body Dressing: Maximal assistance Lower Body Dressing Details (indicate cue type and reason):  socks     Toileting- Clothing Manipulation and Hygiene: Total assistance;+2 for physical assistance              Extremity/Trunk Assessment              Vision       Perception     Praxis      Cognition Arousal/Alertness: Awake/alert Behavior During Therapy: WFL for tasks  assessed/performed, Anxious Overall Cognitive Status: No family/caregiver present to determine baseline cognitive functioning Area of Impairment: Attention, Following commands, Awareness, Safety/judgement, Problem solving, Orientation                 Orientation Level: Disoriented to, Time, Situation Current Attention Level: Selective   Following Commands: Follows one step commands with increased time, Follows one step commands inconsistently Safety/Judgement: Decreased awareness of safety, Decreased awareness of deficits Awareness: Anticipatory, Emergent Problem Solving: Slow processing, Decreased initiation, Difficulty sequencing, Requires verbal cues, Requires tactile cues          Exercises      Shoulder Instructions       General Comments      Pertinent Vitals/ Pain       Pain Assessment Pain Assessment: PAINAD Faces Pain Scale: Hurts whole lot Breathing: occasional labored breathing, short period of hyperventilation Negative Vocalization: occasional moan/groan, low speech, negative/disapproving quality Facial Expression: sad, frightened, frown Body Language: tense, distressed pacing, fidgeting Consolability: distracted or reassured by voice/touch PAINAD Score: 5 Pain Location: B hips Pain Descriptors / Indicators: Grimacing, Guarding, Moaning Pain Intervention(s): Limited activity within patient's tolerance, Monitored during session, Repositioned  Home Living                                          Prior Functioning/Environment              Frequency  Min 2X/week        Progress Toward Goals  OT Goals(current goals can now be found in the care plan section)  Progress towards OT goals: Progressing toward goals     Plan Discharge plan remains appropriate;Frequency remains appropriate    Co-evaluation                 AM-PAC OT "6 Clicks" Daily Activity     Outcome Measure   Help from another person eating meals?:  None Help from another person taking care of personal grooming?: A Little Help from another person toileting, which includes using toliet, bedpan, or urinal?: A Lot Help from another person bathing (including washing, rinsing, drying)?: A Lot Help from another person to put on and taking off regular upper body clothing?: A Little Help from another person to put on and taking off regular lower body clothing?: A Lot 6 Click Score: 16    End of Session Equipment Utilized During Treatment: Rolling walker (2 wheels)  OT Visit Diagnosis: Other abnormalities of gait and mobility (R26.89);Muscle weakness (generalized) (M62.81);Pain   Activity Tolerance Patient limited by pain   Patient Left in bed;with call bell/phone within reach;with bed alarm set;with nursing/sitter in room   Nurse Communication Mobility status        Time: 6269-4854 OT Time Calculation (min): 36 min  Charges: OT General Charges $OT Visit: 1 Visit OT Treatments $Self Care/Home Management : 8-22 mins $Therapeutic Activity: 8-22 mins  Shanon Payor, OTD OTR/L  02/27/22, 4:14 PM

## 2022-02-27 NOTE — TOC Progression Note (Signed)
Transition of Care [facility]) - Progression Note    Patient Details  Name: Theresa Warren MRN: [id number] Date of Birth: March 24, 1956  Transition of [facility]) CM/SW Contact  Eileen Stanford, LCSW Phone Number: 02/27/2022, 1:00 PM  Clinical Narrative:   Theresa Warren still pending for SNF days. However, Theresa Warren has paid [facility] for the first week of copays. [facility] states they can take pt tomorrow if ready. Pt is aware pt will be on Fentanyl patch and are ok with it.    Expected Discharge Plan: [place] Barriers to Discharge: Continued Medical Work up  Expected Discharge Plan and Services Expected Discharge Plan: [place] In-house Referral: Clinical Social Work   Post Acute Care Choice: [place] Living arrangements for the past 2 months: Single Family Home                                       Social Determinants of Health (SDOH) Interventions    Readmission Risk Interventions     No data to display

## 2022-02-27 NOTE — Progress Notes (Signed)
PROGRESS NOTE    Theresa Warren  IRC:[id number] DOB: 1956/06/17 DOA: 02/22/2022 PCP: Crist Infante, MD    Brief Narrative:  66 y.o. 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     The patient presents to [facility] ED from SNF via EMS due to a fall, she slid off her bed.  Imaging in the ED is reassuring, no fractures.  Noted to be tachycardic with leukocytosis greater than 21000 with concern for sepsis secondary to necrotic sacral decubitus wounds.  The patient was started on empiric broad-spectrum IV antibiotics, IV vancomycin, Rocephin and IV Flagyl.  EDP discussed the case with general surgery.  Patient underwent bedside debridement by general surgery.    Assessment & Plan:   Principal Problem:   Sepsis ([place]) Active Problems:   Chronic pain syndrome   Anemia of renal disease   Type 2 diabetes mellitus with chronic kidney disease, with long-term current use of insulin (HCC)   Severe obesity (BMI >= 40) (HCC)   ESRD (end stage renal disease) (HCC)   Wound of buttock   Bilateral lower extremity edema   Anemia due to chronic kidney disease, on chronic dialysis ([place])   Hypertension  Sepsis secondary to necrotic sacral decubitus wounds, POA -Patient presented with criteria for sepsis with leukocytosis, tachycardia, sacral pain with concern for infected necrotic sacral decubitus wounds. -Status post bedside debridement by general surgery -Blood cultures no growth to date -Leukocytosis persistent Plan: Continue broad-spectrum IV antibiotics for now Multimodal pain control WOC follow-up Regular dressing changes ID consulted 7/26  Acute  diarrhea, resolved Noted initially on 7/25.  Bedside RN on 7/26 notes persistent watery diarrhea.  No fever.  Positive leukocytosis.  No abdominal cramping. Plan: At this time clinical suspicion for C. difficile is low.  Infectious disease consulted for comment.   KUB reassuring Attempted Questran, patient could not tolerate GI PCR panel negative If diarrhea remains resolved will discontinue rectal tube 7/29  End-stage renal disease on HD TTS Possible calciphylaxis Nephrology following patient while in house.  Concern for calciphylaxis.  Is on sodium thiosulfate Outpatient referral to dermatology Dr. Laurence Ferrari  Chronic bilateral lower extremity edema -May be secondary to volume overload from end-stage renal disease. -Patient noted to have had bilateral lower extremity Doppler ultrasound on 01/12/2022 with no evidence of DVT. -Keep lower extremities elevated. -Nephrology consulted for hemodialysis with fluid removal as tolerated  Type 2 diabetes mellitus with hyperglycemia -Last hemoglobin A1c 10.0 on 12/18/2021.  Poor control -Continue Semglee 15 units daily.  NovoLog 3 units 3 times daily with meals.  SSI.   Anemia -Likely anemia of chronic disease secondary to end-stage renal disease. -Hemoglobin at 6.6 from 7.1 from 7.9 on admission, was 9.0 on 02/06/2022. -Patient with no overt bleeding. -Anemia panel consistent with anemia of chronic disease.   -Transfused 2 units packed red blood cells during hemodialysis 7/25.   -Follow-up H&H reassuring -Transfusion threshold hemoglobin < 7.  -If patient needs another transfusion will likely need to be given during HD.  Hyperlipidemia -Continue Lipitor.  Diabetic polyneuropathy/chronic pain -Continue home regimen Neurontin. -Continue home regiment pain medications  Physical deconditioning with fall -PT/OT. -Fall precautions. -Likely needs SNF placement. -TOC consult. -Palliative care consulted 7/27  Hypertension -Patient noted to  have  soft blood pressures on presentation. -BP improving.   -Continue to hold home oral antihypertensive medications and could likely start resuming tomorrow.   -DC scheduled midodrine  Severe obesity/morbid obesity -Lifestyle modification. -Outpatient follow-up with PCP.   endometrial thickness -Noted on CT abdomen and pelvis with a endometrial canal measuring 13 mm thickness in the lower urinary segment. -Pelvic ultrasound with thickened endometrium measuring up to 11 mm.  Trace fluid within the endometrial canal at the lower uterine segment.  Endometrial sampling is indicated to exclude carcinoma.  Nonvisualization of bilateral ovaries.  Layering debris noted within the urinary bladder which could be secondary to stasis possibly blood products.  Correlate with urinalysis. -Phone consultation was done with OB/GYN, Dr. Rolly Salter who reviewed the charts, reviewed the pelvic ultrasound and recommended outpatient follow-up with GYN for endometrial biopsy. -See follow-up in discharge navigator.  Follow-up with Dr. Hulan Fray OB/GYN    DVT prophylaxis: SQ heparin Code Status: Full Family Communication: None today Disposition Plan: Status is: Inpatient Remains inpatient appropriate because: Sepsis on broad-spectrum IV antibiotics   Level of care: Med-Surg  Consultants:  ID  Procedures:  Bedside debridement of sacral ulcer 7/25  Antimicrobials: Vancomycin Cefepime   Subjective: Seen and examined.  Resting comfortably in bed.  No visible distress.  Pain control improving.  Diarrhea improving  Objective: Vitals:   02/26/22 1942 02/27/22 0038 02/27/22 0420 02/27/22 0755  BP: 122/65 (!) 146/56 (!) 121/58 137/71  Pulse: (!) 110 87 74 79  Resp: 17 17 14 16   Temp: 98.9 F (37.2 C) 99.1 F (37.3 C) 99.1 F (37.3 C) 97.9 F (36.6 C)  TempSrc:   Oral   SpO2: 99% 99% 97% 99%  Weight:      Height:        Intake/Output Summary (Last 24 hours) at 02/27/2022 1127 Last data filed at 02/27/2022  0308 Gross per 24 hour  Intake 100 ml  Output 1400 ml  Net -1300 ml   Filed Weights   02/22/22 0934 02/26/22 1048 02/26/22 1507  Weight: 113.4 kg 101.9 kg 102.1 kg    Examination:  General exam: No acute distress.  Energy level improved this morning Respiratory system: Clear lungs.  Normal work of breathing.  Room air Cardiovascular system: S1-S2, RRR, no murmurs, 2+ pedal edema BLE Gastrointestinal system: Obese, soft, NT/ND, normal bowel sounds Central nervous system: Alert and oriented. No focal neurological deficits. Extremities: Symmetric 5 x 5 power. Skin: Sacral decubitus ulcers with areas of necrotic eschar Psychiatry: Judgement and insight appear normal. Mood & affect appropriate.     Data Reviewed: I have personally reviewed following labs and imaging studies  CBC: Recent Labs  Lab 02/22/22 0930 02/23/22 0740 02/23/22 1459 02/24/22 0414 02/25/22 0606 02/26/22 1104  WBC 21.5* 20.8*  --  14.4* 17.2* 9.8  NEUTROABS  --  14.9*  --  10.9* 13.8* 7.6  HGB 7.9* 7.1* 6.5* 6.6* 8.7* 8.7*  HCT 26.6* 24.0* 21.5* 21.1* 27.8* 28.3*  MCV 92.4 93.4  --  92.5 89.4 91.3  PLT 447* 346  --  333 305 094   Basic Metabolic Panel: Recent Labs  Lab 02/22/22 0930 02/23/22 0417 02/24/22 0414 02/25/22 0606 02/26/22 1104  NA 138 136 135 139 135  K 4.3 4.5 4.3 3.5 3.9  CL 100 102 101 100 98  CO2 23 20* 22 21* 18*  GLUCOSE 242* 110* 302* 96 166*  BUN 23 27* 40* 34* 46*  CREATININE 2.97* 3.41* 4.64* 3.55* 4.40*  CALCIUM 9.0  8.3* 8.8* 8.8* 8.7*  MG  --  1.8  --   --   --   PHOS  --  3.0 3.7 2.8  --    GFR: Estimated Creatinine Clearance: 14.5 mL/min (A) (by C-G formula based on SCr of 4.4 mg/dL (H)). Liver Function Tests: Recent Labs  Lab 02/22/22 0930 02/23/22 0417 02/24/22 0414 02/25/22 0606  AST 23 18  --   --   ALT 20 15  --   --   ALKPHOS 190* 145*  --   --   BILITOT 1.1 1.0  --   --   PROT 6.7 5.2*  --   --   ALBUMIN 2.4* 1.8* 1.9* 2.1*   No results for  input(s): "LIPASE", "AMYLASE" in the last 168 hours. No results for input(s): "AMMONIA" in the last 168 hours. Coagulation Profile: No results for input(s): "INR", "PROTIME" in the last 168 hours. Cardiac Enzymes: Recent Labs  Lab 02/22/22 0930  CKTOTAL 30*   BNP (last 3 results) No results for input(s): "PROBNP" in the last 8760 hours. HbA1C: No results for input(s): "HGBA1C" in the last 72 hours. CBG: Recent Labs  Lab 02/25/22 2221 02/26/22 0833 02/26/22 1613 02/26/22 2022 02/27/22 0746  GLUCAP 107* 163* 133* 133* 116*   Lipid Profile: No results for input(s): "CHOL", "HDL", "LDLCALC", "TRIG", "CHOLHDL", "LDLDIRECT" in the last 72 hours. Thyroid Function Tests: No results for input(s): "TSH", "T4TOTAL", "FREET4", "T3FREE", "THYROIDAB" in the last 72 hours. Anemia Panel: No results for input(s): "VITAMINB12", "FOLATE", "FERRITIN", "TIBC", "IRON", "RETICCTPCT" in the last 72 hours.  Sepsis Labs: Recent Labs  Lab 02/22/22 0932  LATICACIDVEN 1.5    Recent Results (from the past 240 hour(s))  Blood culture (routine x 2)     Status: None   Collection Time: 02/22/22 10:29 AM   Specimen: BLOOD  Result Value Ref Range Status   Specimen Description BLOOD RIGHT ANTECUBITAL  Final   Special Requests   Final    BOTTLES DRAWN AEROBIC AND ANAEROBIC Blood Culture results may not be optimal due to an inadequate volume of blood received in culture bottles   Culture   Final    NO GROWTH 5 DAYS Performed at [facility], [address] [place], [place] [address]    Report Status 02/27/2022 FINAL  Final  Blood culture (routine x 2)     Status: None   Collection Time: 02/22/22 10:29 AM   Specimen: BLOOD  Result Value Ref Range Status   Specimen Description BLOOD Blood Culture adequate volume  Final   Special Requests   Final    BOTTLES DRAWN AEROBIC AND ANAEROBIC LEFT ANTECUBITAL   Culture   Final    NO GROWTH 5 DAYS Performed at [facility], [address] [place], [place] [address]    Report Status 02/27/2022 FINAL  Final  Gastrointestinal Panel by PCR , Stool     Status: None   Collection Time: 02/25/22  5:50 PM   Specimen: Stool  Result Value Ref Range Status   Campylobacter species NOT DETECTED NOT DETECTED Final   Plesimonas shigelloides NOT DETECTED NOT DETECTED Final   Salmonella species NOT DETECTED NOT DETECTED Final   Yersinia enterocolitica NOT DETECTED NOT DETECTED Final   Vibrio species NOT DETECTED NOT DETECTED Final   Vibrio cholerae NOT DETECTED NOT DETECTED Final   Enteroaggregative E coli (EAEC) NOT DETECTED NOT DETECTED Final   Enteropathogenic E coli (EPEC) NOT DETECTED NOT DETECTED Final   Enterotoxigenic E  coli (ETEC) NOT DETECTED NOT DETECTED Final   Shiga like toxin producing E coli (STEC) NOT DETECTED NOT DETECTED Final   Shigella/Enteroinvasive E coli (EIEC) NOT DETECTED NOT DETECTED Final   Cryptosporidium NOT DETECTED NOT DETECTED Final   Cyclospora cayetanensis NOT DETECTED NOT DETECTED Final   Entamoeba histolytica NOT DETECTED NOT DETECTED Final   Giardia lamblia NOT DETECTED NOT DETECTED Final   Adenovirus F40/41 NOT DETECTED NOT DETECTED Final   Astrovirus NOT DETECTED NOT DETECTED Final   Norovirus GI/GII NOT DETECTED NOT DETECTED Final   Rotavirus A NOT DETECTED NOT DETECTED Final   Sapovirus (I, II, IV, and V) NOT DETECTED NOT DETECTED Final    Comment: Performed at [facility], [address], [place], [place] [address]         Radiology Studies: DG Abd 1 View  Result Date: 02/25/2022 CLINICAL DATA:  Diarrhea.  Abdominal pain. EXAM: ABDOMEN - 1 VIEW COMPARISON:  CT 02/22/2022 FINDINGS: Bowel gas pattern does not suggest ileus or obstruction. Moderate amount of stool in the rectum. Vascular calcification is noted. No acute bone finding. IMPRESSION: Unremarkable bowel gas pattern. Moderate amount of fecal matter in the rectum. Electronically Signed   By: Nelson Chimes M.D.   On:  02/25/2022 13:45        Scheduled Meds:  (feeding supplement) PROSource Plus  30 mL Oral TID BM   allopurinol  100 mg Oral Daily   ascorbic acid  500 mg Oral BID   atorvastatin  40 mg Oral Daily   Chlorhexidine Gluconate Cloth  6 each Topical Daily   cholestyramine light  4 g Oral BID   fentaNYL  1 patch Transdermal Q72H   fluticasone  2 spray Each Nare Daily   gabapentin  200 mg Oral TID   heparin injection (subcutaneous)  5,000 Units Subcutaneous Q8H   insulin aspart  0-6 Units Subcutaneous TID WC   insulin aspart  3 Units Subcutaneous TID WC   insulin glargine-yfgn  15 Units Subcutaneous Daily   loratadine  10 mg Oral Daily   metoprolol tartrate  25 mg Oral BID   multivitamin  1 tablet Oral QHS   nutrition supplement (JUVEN)  1 packet Oral BID BM   omega-3 acid ethyl esters  1 g Oral Daily   pantoprazole  40 mg Oral Q0600   Ensure Max Protein  11 oz Oral BID   psyllium  1 packet Oral Daily   simethicone  80 mg Oral QID   Continuous Infusions:  [START ON 02/28/2022]  ceFAZolin (ANCEF) IV     metronidazole 500 mg (02/27/22 0935)   sodium thiosulfate 25 g in sodium chloride 0.9 % 200 mL Infusion for Calciphylaxis 25 g (02/26/22 1409)     LOS: 5 days       Sidney Ace, MD [facility]   If 7PM-7AM, please contact night-coverage  02/27/2022, 11:27 AM

## 2022-02-27 NOTE — Progress Notes (Signed)
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  Patient presents to the emergency department after suffering a mechanical fall at nursing facility.  Patient has been admitted for ESRD (end stage renal disease) ([place]) [N18.6] Wound of buttock, unspecified laterality, initial encounter [S31.809A] Sepsis ([place]) [A41.9] Sepsis, due to unspecified organism, unspecified whether acute organ dysfunction present [facility]) [A41.9]  Patient is known to our practice from previous admissions and receives outpatient dialysis treatment at [facility] on a TTS schedule,   Patient seen sitting up in bed Continues to report pain associated with dressing changes.   Objective:  Vital signs in last 24 hours:  Temp:  [97.9 F (36.6 C)-99.1 F (37.3 C)] 97.9 F (36.6 C) (07/28 0755) Pulse Rate:  [68-110] 68 (07/28 1239) Resp:  [13-27] 16 (07/28 0755) BP: (60-146)/(37-71) 117/61 (07/28 1239) SpO2:  [97 %-100 %] 99 % (07/28 0755) Weight:  [102.1 kg] 102.1 kg (07/27 1507)  Weight change:  Filed Weights   02/22/22 0934 02/26/22 1048 02/26/22 1507  Weight: 113.4 kg 101.9 kg 102.1 kg    Intake/Output: I/O last 3 completed shifts: In: 30 [P.O.:480; IV Piggyback:1118] Out: 1400 [Other:1400]   Intake/Output this shift:  No intake/output data recorded.  Physical Exam: General: NAD, sitting up in bed  Head: Normocephalic, atraumatic. Moist oral mucosal membranes  Eyes: Anicteric  Lungs:  Clear to auscultation, normal effort, room air  Heart: Regular rate and rhythm  Abdomen:  Soft, nontender, obese  Extremities: 1+ peripheral edema.  Neurologic: Nonfocal, moving all four extremities  Skin: Warm, dry.  Significant decubitus ulcers.  Access: Right chest PermCath     Basic Metabolic Panel: Recent Labs  Lab 02/22/22 0930 02/23/22 0417 02/24/22 0414 02/25/22 0606 02/26/22 1104  NA 138 136 135 139 135  K 4.3 4.5 4.3 3.5 3.9  CL 100 102 101 100 98  CO2 23 20* 22 21* 18*  GLUCOSE 242* 110* 302* 96 166*  BUN 23 27* 40* 34* 46*  CREATININE 2.97* 3.41* 4.64* 3.55* 4.40*  CALCIUM 9.0 8.3* 8.8* 8.8* 8.7*  MG  --  1.8  --   --   --   PHOS  --  3.0 3.7 2.8  --      Liver Function Tests: Recent Labs  Lab 02/22/22 0930 02/23/22 0417 02/24/22 0414 02/25/22 0606  AST 23 18  --   --   ALT 20 15  --   --   ALKPHOS 190* 145*  --   --   BILITOT 1.1 1.0  --   --   PROT 6.7 5.2*  --   --   ALBUMIN 2.4* 1.8* 1.9* 2.1*    No results for input(s): "LIPASE", "AMYLASE" in the last 168 hours. No results for input(s): "AMMONIA" in the last 168 hours.  CBC: Recent Labs  Lab 02/22/22 0930 02/23/22 0740 02/23/22 1459 02/24/22 0414 02/25/22 0606 02/26/22 1104  WBC 21.5* 20.8*  --  14.4* 17.2* 9.8  NEUTROABS  --  14.9*  --  10.9* 13.8* 7.6  HGB 7.9* 7.1* 6.5* 6.6* 8.7* 8.7*  HCT 26.6* 24.0* 21.5* 21.1* 27.8* 28.3*  MCV 92.4 93.4  --  92.5 89.4 91.3  PLT 447* 346  --  333 305 318     Cardiac Enzymes: Recent Labs  Lab 02/22/22 0930  CKTOTAL 30*  BNP: Invalid input(s): "POCBNP"  CBG: Recent Labs  Lab 02/26/22 0833 02/26/22 1613 02/26/22 2022 02/27/22 0746 02/27/22 1145  GLUCAP 163* 133* 133* 116* 90     Microbiology: Results for orders placed or performed during the hospital encounter of 02/22/22  Blood culture (routine x 2)     Status: None   Collection Time: 02/22/22 10:29 AM   Specimen: BLOOD  Result Value Ref Range Status   Specimen Description BLOOD RIGHT ANTECUBITAL  Final   Special Requests   Final    BOTTLES DRAWN AEROBIC AND ANAEROBIC Blood Culture results may not be optimal due to an inadequate volume of blood received in culture bottles   Culture   Final    NO GROWTH 5 DAYS Performed at [facility], [address] [place], [place] [address]    Report Status 02/27/2022 FINAL  Final  Blood culture (routine x 2)     Status: None   Collection Time: 02/22/22 10:29 AM   Specimen: BLOOD  Result Value Ref Range Status   Specimen Description BLOOD Blood Culture adequate volume  Final   Special Requests   Final    BOTTLES DRAWN AEROBIC AND ANAEROBIC LEFT ANTECUBITAL   Culture   Final    NO GROWTH 5 DAYS Performed at [facility], [address] [place], [place] [address]    Report Status 02/27/2022 FINAL  Final  Gastrointestinal Panel by PCR , Stool     Status: None   Collection Time: 02/25/22  5:50 PM   Specimen: Stool  Result Value Ref Range Status   Campylobacter species NOT DETECTED NOT DETECTED Final   Plesimonas shigelloides NOT DETECTED NOT DETECTED Final   Salmonella species NOT DETECTED NOT DETECTED Final   Yersinia enterocolitica NOT DETECTED NOT DETECTED Final   Vibrio species NOT DETECTED NOT DETECTED Final   Vibrio cholerae NOT DETECTED NOT DETECTED Final   Enteroaggregative E coli (EAEC) NOT DETECTED NOT DETECTED Final   Enteropathogenic E coli (EPEC) NOT DETECTED NOT DETECTED Final   Enterotoxigenic E coli (ETEC) NOT DETECTED NOT DETECTED Final   Shiga like toxin producing E coli (STEC) NOT DETECTED NOT DETECTED Final   Shigella/Enteroinvasive E coli (EIEC) NOT DETECTED NOT DETECTED Final   Cryptosporidium NOT DETECTED NOT DETECTED Final   Cyclospora cayetanensis NOT DETECTED NOT DETECTED Final   Entamoeba histolytica NOT DETECTED NOT DETECTED Final   Giardia lamblia NOT DETECTED NOT DETECTED Final   Adenovirus F40/41 NOT DETECTED NOT DETECTED Final   Astrovirus NOT DETECTED NOT DETECTED Final   Norovirus GI/GII NOT DETECTED NOT DETECTED Final   Rotavirus A NOT DETECTED NOT DETECTED Final   Sapovirus (I, II, IV, and V) NOT DETECTED NOT DETECTED Final    Comment: Performed at [facility], [address] [place], [place] [address]     Coagulation Studies: No results for input(s): "LABPROT", "INR" in the last 72 hours.  Urinalysis: No results for input(s): "COLORURINE", "LABSPEC", "PHURINE", "GLUCOSEU", "HGBUR", "BILIRUBINUR", "KETONESUR", "PROTEINUR", "UROBILINOGEN", "NITRITE", "LEUKOCYTESUR" in the last 72 hours.  Invalid input(s): "APPERANCEUR"    Imaging: No results found.   Medications:    [START ON 02/28/2022]  ceFAZolin (ANCEF) IV     metronidazole 500 mg (02/27/22 0935)   sodium thiosulfate 25 g in sodium chloride 0.9 % 200 mL Infusion for Calciphylaxis 25 g (02/26/22 1409)    (feeding supplement) PROSource Plus  30 mL Oral TID BM   allopurinol  100 mg Oral Daily   ascorbic acid  500 mg Oral BID   atorvastatin  40 mg Oral Daily   Chlorhexidine Gluconate Cloth  6 each Topical Daily   cholestyramine light  4 g Oral BID   fentaNYL  1 patch Transdermal Q72H   fluticasone  2 spray Each Nare Daily   gabapentin  200 mg Oral TID   heparin injection (subcutaneous)  5,000 Units Subcutaneous Q8H   insulin aspart  0-6 Units Subcutaneous TID WC   insulin aspart  3 Units Subcutaneous TID WC   insulin glargine-yfgn  15 Units Subcutaneous Daily   loratadine  10 mg Oral Daily   metoprolol tartrate  25 mg Oral BID   multivitamin  1 tablet Oral QHS   nutrition supplement (JUVEN)  1 packet Oral BID BM   omega-3 acid ethyl esters  1 g Oral Daily   pantoprazole  40 mg Oral Q0600   Ensure Max Protein  11 oz Oral BID   psyllium  1 packet Oral Daily   simethicone  80 mg Oral QID   acetaminophen, alum & mag hydroxide-simeth, midodrine, ondansetron (ZOFRAN) IV, oxyCODONE, polyethylene glycol  Assessment/ Plan:  Theresa Warren is a 66 y.o.  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  Patient presents to the emergency department after suffering a mechanical fall at  nursing facility.  Patient has been admitted for ESRD (end stage renal disease) ([place]) [N18.6] Wound of buttock, unspecified laterality, initial encounter [S31.809A] Sepsis ([place]) [A41.9] Sepsis, due to unspecified organism, unspecified whether acute organ dysfunction present ([place]) [A41.9]  [facility]S/Rt Permcath  End-stage renal disease on hemodialysis.  Will maintain outpatient schedule if possible.  Next treatment scheduled for Saturday.  Renal navigator aware of patient and monitoring discharge plan to assess outpatient needs.  2. Anemia of chronic kidney disease Lab Results  Component Value Date   HGB 8.7 (L) 02/26/2022   Patient receives Mircera biweekly outpatient.  We will continue to monitor. EPO with HD  3. Secondary Hyperparathyroidism:  Lab Results  Component Value Date   PTH 23 01/15/2022   CALCIUM 8.7 (L) 02/26/2022   CAION 1.11 (L) 01/01/2022   PHOS 2.8 02/25/2022   .  We will continue to monitor bone minerals during this admission.  4.  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  Patient will receive sodium thiosulfate with dialysis treatments.  Continue nutritional supplement, Juven.  5. Diabetes mellitus type II with chronic kidney disease: insulin dependent. Most recent hemoglobin A1c is 10.0 on 12/18/21.     LOS: 5 Sefora Tietje 7/28/20232:02 PM   Patient was examined and evaluated with Colon Flattery, NP.  Plan of care was formulated and discussed with patient as well as NP.  I agree with the note as documented. 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

## 2022-02-27 NOTE — Plan of Care (Signed)

## 2022-02-27 NOTE — Progress Notes (Signed)
Physical Therapy Treatment Patient Details Name: Theresa Warren MRN: [id number] DOB: 1955-08-16 Today's Date: 02/27/2022   History of Present Illness Pt is a 66 y.o. female from [place] who comes in with a fall.  Patient reports that she had mechanical fall out of bed where she slipped off the air mattress and was on the ground for 3 hours where she could not get up. MD assessment includes: sepsis secondary to necrotic sacral decubitus wounds, anemia, and physical deconditioning with fall.  PMH significant for chronic anemia. hyperlipidemia, hypertension, CKD stage V, insulin-dependent diabetes mellitus.    PT Comments    Pt in bed eating bagel at first attempt, then Pryor Curia returns later for session. Pt still somewhat nauseated but is a agreeable to participation. Attempted to lower HOB for first exercises, but tolerance to less than upright sitting is quite poor, easily exacerbates pain in lower lumbar spine and left superolateral gluteal, unable to indicated wound v radicular pain given bilateral pars defects. Pt tolerates as low as 35* with pillow support for leg exercises but frequently takes breaks to bring trunk into flexion periodically. Focus on bed level exercises with rest intervals provided as needed for pain management. Pt very pleasant and motivated despite levels of discomfort. EOS pt reports rib pain is improving but not nearly as limiting as pain in aforementioned areas.     Recommendations for follow up therapy are one component of a multi-disciplinary discharge planning process, led by the attending physician.  Recommendations may be updated based on patient status, additional functional criteria and insurance authorization.  Follow Up Recommendations  Skilled nursing-short term rehab (<3 hours/day)     Assistance Recommended at Discharge Intermittent Supervision/Assistance  Patient can return home with the following Two people to help with walking and/or  transfers;Two people to help with bathing/dressing/bathroom;Assistance with cooking/housework;Assist for transportation;Help with stairs or ramp for entrance   Equipment Recommendations  None recommended by PT    Recommendations for Other Services       Precautions / Restrictions Precautions Precautions: Fall Precaution Comments: sacral & leg wounds Restrictions Weight Bearing Restrictions: No Other Position/Activity Restrictions: history of falling with staff present     Mobility  Bed Mobility Overal bed mobility:  (grossly deferred due to poor tolerance to various positioning attempts in bed.)                  Transfers                        Ambulation/Gait                   Stairs             Wheelchair Mobility    Modified Rankin (Stroke Patients Only)       Balance                                            Cognition                                                Exercises Other Exercises Other Exercises: Bed positional modifications to find best tolerance in conext of lumbar spine pain in extension (tolerates ~ 36 degrees  HOB c 2 pillows behind back, frequent breaks in longsitting flexion Other Exercises: SAQ 2x15 B, pillow squeeze 2x10x3secH, short sitting/reclined marching (partial movement) 2x10 Rt, 110 Left. (left not tolerated) Other Exercises: education on leg positioning for more independent leg mobility during the day    General Comments        Pertinent Vitals/Pain Pain Assessment Pain Assessment: Faces Faces Pain Scale: Hurts even more    Home Living                          Prior Function            PT Goals (current goals can now be found in the care plan section) Acute Rehab PT Goals Patient Stated Goal: get better PT Goal Formulation: With patient Time For Goal Achievement: 03/08/22 Potential to Achieve Goals: Fair Progress towards PT goals: Not  progressing toward goals - comment    Frequency    Min 2X/week      PT Plan Current plan remains appropriate    Co-evaluation              AM-PAC PT "6 Clicks" Mobility   Outcome Measure  Help needed turning from your back to your side while in a flat bed without using bedrails?: A Little Help needed moving from lying on your back to sitting on the side of a flat bed without using bedrails?: A Lot Help needed moving to and from a bed to a chair (including a wheelchair)?: A Lot Help needed standing up from a chair using your arms (e.g., wheelchair or bedside chair)?: A Lot Help needed to walk in hospital room?: Total Help needed climbing 3-5 steps with a railing? : Total 6 Click Score: 11    End of Session   Activity Tolerance: Patient limited by pain Patient left: in bed;with call bell/phone within reach;with bed alarm set Nurse Communication: Mobility status PT Visit Diagnosis: Unsteadiness on feet (R26.81);Muscle weakness (generalized) (M62.81);Difficulty in walking, not elsewhere classified (R26.2);History of falling (Z91.81);Pain     Time: 1235-1300 PT Time Calculation (min) (ACUTE ONLY): 25 min  Charges:  $Therapeutic Exercise: 23-37 mins                    5:30 PM, 02/27/22 Etta Grandchild, PT, DPT Physical Therapist - [facility]  [phone] ([place])    [place] C 02/27/2022, 5:24 PM

## 2022-02-28 DIAGNOSIS — R6520 Severe sepsis without septic shock: Secondary | ICD-10-CM | POA: Diagnosis not present

## 2022-02-28 DIAGNOSIS — N186 End stage renal disease: Secondary | ICD-10-CM | POA: Diagnosis not present

## 2022-02-28 DIAGNOSIS — A419 Sepsis, unspecified organism: Secondary | ICD-10-CM | POA: Diagnosis not present

## 2022-02-28 LAB — TYPE AND SCREEN
ABO/RH(D): O POS
Antibody Screen: NEGATIVE
Unit division: 0
Unit division: 0

## 2022-02-28 LAB — GLUCOSE, CAPILLARY
Glucose-Capillary: 94 mg/dL (ref 70–99)
Glucose-Capillary: 129 mg/dL — ABNORMAL HIGH (ref 70–99)
Glucose-Capillary: 152 mg/dL — ABNORMAL HIGH (ref 70–99)
Glucose-Capillary: 170 mg/dL — ABNORMAL HIGH (ref 70–99)

## 2022-02-28 LAB — RENAL FUNCTION PANEL
Albumin: 1.8 g/dL — ABNORMAL LOW (ref 3.5–5.0)
Anion gap: 18 — ABNORMAL HIGH (ref 5–15)
BUN: 35 mg/dL — ABNORMAL HIGH (ref 8–23)
CO2: 18 mmol/L — ABNORMAL LOW (ref 22–32)
Calcium: 8.7 mg/dL — ABNORMAL LOW (ref 8.9–10.3)
Chloride: 97 mmol/L — ABNORMAL LOW (ref 98–111)
Creatinine, Ser: 4.14 mg/dL — ABNORMAL HIGH (ref 0.44–1.00)
GFR, Estimated: 11 mL/min — ABNORMAL LOW (ref >60)
Glucose, Bld: 97 mg/dL (ref 70–99)
Phosphorus: 4 mg/dL (ref 2.5–4.6)
Potassium: 3.6 mmol/L (ref 3.5–5.1)
Sodium: 133 mmol/L — ABNORMAL LOW (ref 135–145)

## 2022-02-28 LAB — BPAM RBC
Blood Product Expiration Date: 202308262359
Blood Product Expiration Date: 202308262359
ISSUE DATE / TIME: 202307251808
Unit Type and Rh: 5100
Unit Type and Rh: 5100

## 2022-02-28 LAB — CBC
HCT: 24.7 % — ABNORMAL LOW (ref 36.0–46.0)
Hemoglobin: 8 g/dL — ABNORMAL LOW (ref 12.0–15.0)
MCH: 28.4 pg (ref 26.0–34.0)
MCHC: 32.4 g/dL (ref 30.0–36.0)
MCV: 87.6 fL (ref 80.0–100.0)
Platelets: 272 10*3/uL (ref 150–400)
RBC: 2.82 MIL/uL — ABNORMAL LOW (ref 3.87–5.11)
RDW: 18.7 % — ABNORMAL HIGH (ref 11.5–15.5)
WBC: 14.8 10*3/uL — ABNORMAL HIGH (ref 4.0–10.5)
nRBC: 0 % (ref 0.0–0.2)

## 2022-02-28 MED ORDER — MORPHINE SULFATE (PF) 2 MG/ML IV SOLN: 1.0000 mg | Freq: Once | INTRAVENOUS | Status: DC

## 2022-02-28 MED ORDER — OXYCODONE HCL 5 MG PO TABS
5.0000 mg | ORAL_TABLET | Freq: Once | ORAL | Status: AC
  Administered 2022-02-28: 5 mg via ORAL

## 2022-02-28 MED ORDER — EPOETIN ALFA 4000 UNIT/ML IJ SOLN
INTRAMUSCULAR | Status: AC
  Filled 2022-02-28: qty 1

## 2022-02-28 MED ORDER — ALUM & MAG HYDROXIDE-SIMETH 200-200-20 MG/5ML PO SUSP
30.0000 mL | Freq: Four times a day (QID) | ORAL | Status: DC
  Administered 2022-02-28 – 2022-03-02 (×7): 30 mL via ORAL
  Filled 2022-02-28 (×7): qty 30

## 2022-02-28 MED ORDER — ONDANSETRON HCL 4 MG PO TABS: 4.0000 mg | ORAL_TABLET | Freq: Four times a day (QID) | ORAL | Status: DC | PRN

## 2022-02-28 NOTE — Progress Notes (Signed)
HD end 

## 2022-02-28 NOTE — Progress Notes (Signed)
Pt called out to request prn oxycodone.  This nurse went into pt's room to inform her that medication was available for administration in 30 more minutes.  Upon arrival to room, pt resting with eyes closed with relaxed body posture.   This nurse will wait for pt to call out again or reassess in 30 minutes (when medication is available for administration); whichever comes 1st.

## 2022-02-28 NOTE — Progress Notes (Signed)
PROGRESS NOTE    Theresa [place]  GYB:[id number] DOB: August 05, 1955 DOA: 02/22/2022 PCP: Crist Infante, MD    Brief Narrative:  66 y.o. 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     The patient presents to [facility] ED from SNF via EMS due to a fall, she slid off her bed.  Imaging in the ED is reassuring, no fractures.  Noted to be tachycardic with leukocytosis greater than 21000 with concern for sepsis secondary to necrotic sacral decubitus wounds.  The patient was started on empiric broad-spectrum IV antibiotics, IV vancomycin, Rocephin and IV Flagyl.  EDP discussed the case with general surgery.  Patient underwent bedside debridement by general surgery.    Hospital course and discharge planning delayed and complicated by intractable pain.  Patient is opioid tolerant and presence of ESRD makes it difficult to achieve adequate pain control  Assessment & Plan:   Principal Problem:   Sepsis ([place]) Active Problems:   Chronic pain syndrome   Anemia of renal disease   Type 2 diabetes mellitus with chronic kidney disease, with long-term current use of insulin (HCC)   Severe obesity (BMI >= 40) (HCC)   ESRD (end stage renal disease) (HCC)   Wound of buttock   Bilateral lower extremity edema   Anemia due to chronic kidney disease, on chronic dialysis ([place])   Hypertension  Sepsis secondary to necrotic sacral decubitus wounds, POA -Patient presented with criteria for sepsis with leukocytosis, tachycardia, sacral pain with concern for infected necrotic sacral decubitus wounds. -Status post bedside debridement by general surgery -Blood cultures no growth to  date -Leukocytosis persistent Plan: Continue Ancef and Flagyl for now Antibiotics to be discontinued at time of discharge Multimodal pain control, attempt to minimize use of p.o. oxycodone No IV narcotics WOC follow-up Regular dressing changes  Acute diarrhea, resolved Noted initially on 7/25.  Bedside RN on 7/26 notes persistent watery diarrhea.  No fever.  Positive leukocytosis.  No abdominal cramping. Plan: At this time clinical suspicion for C. difficile is low.  Infectious disease consulted for comment.   KUB reassuring Attempted Questran, patient could not tolerate GI PCR panel negative Discontinue rectal tube 7/29  End-stage renal disease on HD TTS Possible calciphylaxis Nephrology following patient while in house.  Concern for calciphylaxis.  Is on sodium thiosulfate Outpatient referral to dermatology Dr. Laurence Ferrari  Chronic bilateral lower extremity edema -May be secondary to volume overload from end-stage renal disease. -Patient noted to have had bilateral lower extremity Doppler ultrasound on 01/12/2022 with no evidence of DVT. -Keep lower extremities elevated. -Nephrology consulted for hemodialysis with fluid removal as tolerated  Type 2 diabetes mellitus with hyperglycemia -Last hemoglobin A1c 10.0 on 12/18/2021.  Poor control -Continue Semglee 15 units daily.  NovoLog 3 units 3 times daily with meals.  SSI.   Anemia -Likely anemia of chronic disease secondary to end-stage renal disease. -Hemoglobin at 6.6 from 7.1 from 7.9 on admission, was 9.0 on 02/06/2022. -Patient with no overt bleeding. -Anemia panel consistent with anemia of chronic disease.   -Transfused 2 units packed red blood cells during hemodialysis 7/25.   -Follow-up H&H reassuring -Transfusion threshold hemoglobin < 7.  -If patient needs another transfusion will likely need to be given during HD.  Hyperlipidemia -Continue Lipitor.  Diabetic polyneuropathy/chronic pain -Continue home regimen  Neurontin. -Continue home regiment pain medications  Physical deconditioning with fall -PT/OT. -Fall precautions. -Likely needs SNF placement. -TOC consult. -Palliative care consulted 7/27  Hypertension -Patient noted to have soft blood pressures on presentation. -BP improving.   -Continue to hold home oral antihypertensive medications and could likely start resuming tomorrow.   -DC scheduled midodrine  Severe obesity/morbid obesity -Lifestyle modification. -Outpatient follow-up with PCP.   endometrial thickness -Noted on CT abdomen and pelvis with a endometrial canal measuring 13 mm thickness in the lower urinary segment. -Pelvic ultrasound with thickened endometrium measuring up to 11 mm.  Trace fluid within the endometrial canal at the lower uterine segment.  Endometrial sampling is indicated to exclude carcinoma.  Nonvisualization of bilateral ovaries.  Layering debris noted within the urinary bladder which could be secondary to stasis possibly blood products.  Correlate with urinalysis. -Phone consultation was done with OB/GYN, Dr. Rolly Salter who reviewed the charts, reviewed the pelvic ultrasound and recommended outpatient follow-up with GYN for endometrial biopsy. -See follow-up in discharge navigator.  Follow-up with Dr. Hulan Fray OB/GYN    DVT prophylaxis: SQ heparin Code Status: Full Family Communication: None today Disposition Plan: Status is: Inpatient Remains inpatient appropriate because: Sepsis on broad-spectrum IV antibiotics.  Intractable pain   Level of care: Med-Surg  Consultants:  ID  Procedures:  Bedside debridement of sacral ulcer 7/25  Antimicrobials: Flagyl Cefazolin   Subjective: Seen and examined after HD.  Pain moderate.  Diarrhea resolved  Objective: Vitals:   02/28/22 1220 02/28/22 1222 02/28/22 1225 02/28/22 1307  BP:  108/76 (!) 109/56 (!) 128/56  Pulse:  (!) 109 (!) 110 96  Resp:  17 16 18   Temp: 99.1 F (37.3 C)   98.2 F (36.8 C)   TempSrc: Oral     SpO2:  100% 100% 100%  Weight:   101.7 kg   Height:        Intake/Output Summary (Last 24 hours) at 02/28/2022 1357 Last data filed at 02/28/2022 1222 Gross per 24 hour  Intake 120 ml  Output 2000 ml  Net -1880 ml   Filed Weights   02/26/22 1507 02/28/22 0824 02/28/22 1225  Weight: 102.1 kg 102.8 kg 101.7 kg    Examination:  General exam: Mild distress due to pain Respiratory system: Clear lungs.  Normal work of breathing.  Room air Cardiovascular system: S1-S2, RRR, no murmurs, 2-3+ pedal edema BLE Gastrointestinal system: Obese, soft, NT/ND, normal bowel sounds Central nervous system: Alert and oriented. No focal neurological deficits. Extremities: Symmetric 5 x 5 power. Skin: Sacral decubitus ulcers with areas of necrotic eschar Psychiatry: Judgement and insight appear normal. Mood & affect appropriate.     Data Reviewed: I have personally reviewed following labs and imaging studies  CBC: Recent Labs  Lab 02/23/22 0740 02/23/22 1459 02/24/22 0414 02/25/22 0606 02/26/22 1104 02/28/22 0641  WBC 20.8*  --  14.4* 17.2* 9.8 14.8*  NEUTROABS 14.9*  --  10.9* 13.8* 7.6  --   HGB 7.1* 6.5* 6.6* 8.7* 8.7* 8.0*  HCT 24.0* 21.5* 21.1* 27.8* 28.3* 24.7*  MCV 93.4  --  92.5 89.4 91.3 87.6  PLT 346  --  333 305 318 628   Basic Metabolic Panel: Recent Labs  Lab 02/23/22 0417 02/24/22 0414 02/25/22 0606 02/26/22 1104 02/28/22 0747  NA 136 135 139 135 133*  K 4.5 4.3 3.5 3.9 3.6  CL 102 101 100 98 97*  CO2 20* 22 21*  18* 18*  GLUCOSE 110* 302* 96 166* 97  BUN 27* 40* 34* 46* 35*  CREATININE 3.41* 4.64* 3.55* 4.40* 4.14*  CALCIUM 8.3* 8.8* 8.8* 8.7* 8.7*  MG 1.8  --   --   --   --   PHOS 3.0 3.7 2.8  --  4.0   GFR: Estimated Creatinine Clearance: 15.3 mL/min (A) (by C-G formula based on SCr of 4.14 mg/dL (H)). Liver Function Tests: Recent Labs  Lab 02/22/22 0930 02/23/22 0417 02/24/22 0414 02/25/22 0606 02/28/22 0747  AST 23 18  --   --    --   ALT 20 15  --   --   --   ALKPHOS 190* 145*  --   --   --   BILITOT 1.1 1.0  --   --   --   PROT 6.7 5.2*  --   --   --   ALBUMIN 2.4* 1.8* 1.9* 2.1* 1.8*   No results for input(s): "LIPASE", "AMYLASE" in the last 168 hours. No results for input(s): "AMMONIA" in the last 168 hours. Coagulation Profile: No results for input(s): "INR", "PROTIME" in the last 168 hours. Cardiac Enzymes: Recent Labs  Lab 02/22/22 0930  CKTOTAL 30*   BNP (last 3 results) No results for input(s): "PROBNP" in the last 8760 hours. HbA1C: No results for input(s): "HGBA1C" in the last 72 hours. CBG: Recent Labs  Lab 02/27/22 1145 02/27/22 1629 02/27/22 2050 02/28/22 0814 02/28/22 1304  GLUCAP 90 152* 125* 94 129*   Lipid Profile: No results for input(s): "CHOL", "HDL", "LDLCALC", "TRIG", "CHOLHDL", "LDLDIRECT" in the last 72 hours. Thyroid Function Tests: No results for input(s): "TSH", "T4TOTAL", "FREET4", "T3FREE", "THYROIDAB" in the last 72 hours. Anemia Panel: No results for input(s): "VITAMINB12", "FOLATE", "FERRITIN", "TIBC", "IRON", "RETICCTPCT" in the last 72 hours.  Sepsis Labs: Recent Labs  Lab 02/22/22 0932  LATICACIDVEN 1.5    Recent Results (from the past 240 hour(s))  Blood culture (routine x 2)     Status: None   Collection Time: 02/22/22 10:29 AM   Specimen: BLOOD  Result Value Ref Range Status   Specimen Description BLOOD RIGHT ANTECUBITAL  Final   Special Requests   Final    BOTTLES DRAWN AEROBIC AND ANAEROBIC Blood Culture results may not be optimal due to an inadequate volume of blood received in culture bottles   Culture   Final    NO GROWTH 5 DAYS Performed at [facility], [address], [place], [place] [address]    Report Status 02/27/2022 FINAL  Final  Blood culture (routine x 2)     Status: None   Collection Time: 02/22/22 10:29 AM   Specimen: BLOOD  Result Value Ref Range Status   Specimen Description BLOOD Blood Culture adequate volume   Final   Special Requests   Final    BOTTLES DRAWN AEROBIC AND ANAEROBIC LEFT ANTECUBITAL   Culture   Final    NO GROWTH 5 DAYS Performed at [facility], [address], [place], [place] [address]    Report Status 02/27/2022 FINAL  Final  Gastrointestinal Panel by PCR , Stool     Status: None   Collection Time: 02/25/22  5:50 PM   Specimen: Stool  Result Value Ref Range Status   Campylobacter species NOT DETECTED NOT DETECTED Final   Plesimonas shigelloides NOT DETECTED NOT DETECTED Final   Salmonella species NOT DETECTED NOT DETECTED Final   Yersinia enterocolitica NOT DETECTED NOT DETECTED Final  Vibrio species NOT DETECTED NOT DETECTED Final   Vibrio cholerae NOT DETECTED NOT DETECTED Final   Enteroaggregative E coli (EAEC) NOT DETECTED NOT DETECTED Final   Enteropathogenic E coli (EPEC) NOT DETECTED NOT DETECTED Final   Enterotoxigenic E coli (ETEC) NOT DETECTED NOT DETECTED Final   Shiga like toxin producing E coli (STEC) NOT DETECTED NOT DETECTED Final   Shigella/Enteroinvasive E coli (EIEC) NOT DETECTED NOT DETECTED Final   Cryptosporidium NOT DETECTED NOT DETECTED Final   Cyclospora cayetanensis NOT DETECTED NOT DETECTED Final   Entamoeba histolytica NOT DETECTED NOT DETECTED Final   Giardia lamblia NOT DETECTED NOT DETECTED Final   Adenovirus F40/41 NOT DETECTED NOT DETECTED Final   Astrovirus NOT DETECTED NOT DETECTED Final   Norovirus GI/GII NOT DETECTED NOT DETECTED Final   Rotavirus A NOT DETECTED NOT DETECTED Final   Sapovirus (I, II, IV, and V) NOT DETECTED NOT DETECTED Final    Comment: Performed at [facility], [address] [place], [place] [address]         Radiology Studies: No results found.      Scheduled Meds:  (feeding supplement) PROSource Plus  30 mL Oral TID BM   allopurinol  100 mg Oral Daily   alum & mag hydroxide-simeth  30 mL Oral Q6H   ascorbic acid  500 mg Oral BID   atorvastatin  40 mg Oral Daily    Chlorhexidine Gluconate Cloth  6 each Topical Daily   cholestyramine light  4 g Oral BID   epoetin alfa       epoetin (EPOGEN/PROCRIT) injection  4,000 Units Intravenous Q T,Th,Sa-HD   fentaNYL  1 patch Transdermal Q72H   fluticasone  2 spray Each Nare Daily   gabapentin  200 mg Oral TID   heparin injection (subcutaneous)  5,000 Units Subcutaneous Q8H   insulin aspart  0-6 Units Subcutaneous TID WC   insulin aspart  3 Units Subcutaneous TID WC   insulin glargine-yfgn  15 Units Subcutaneous Daily   loratadine  10 mg Oral Daily   metoprolol tartrate  25 mg Oral BID   multivitamin  1 tablet Oral QHS   nutrition supplement (JUVEN)  1 packet Oral BID BM   omega-3 acid ethyl esters  1 g Oral Daily   pantoprazole  40 mg Oral Q0600   Ensure Max Protein  11 oz Oral BID   psyllium  1 packet Oral Daily   simethicone  80 mg Oral QID   Continuous Infusions:   ceFAZolin (ANCEF) IV     metronidazole 500 mg (02/28/22 1337)   sodium thiosulfate 25 g in sodium chloride 0.9 % 200 mL Infusion for Calciphylaxis 25 g (02/28/22 1113)     LOS: 6 days       Sidney Ace, MD [facility]   If 7PM-7AM, please contact night-coverage  02/28/2022, 1:57 PM

## 2022-02-28 NOTE — Progress Notes (Signed)
Post HD RN assessment 

## 2022-02-28 NOTE — Progress Notes (Signed)
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  Patient presents to the emergency department after suffering a mechanical fall at nursing facility.  Patient has been admitted for ESRD (end stage renal disease) ([place]) [N18.6] Wound of buttock, unspecified laterality, initial encounter [S31.809A] Sepsis ([place]) [A41.9] Sepsis, due to unspecified organism, unspecified whether acute organ dysfunction present [facility]) [A41.9]  Patient is known to our practice from previous admissions and receives outpatient dialysis treatment at [facility] on a TTS schedule,   Patient seen sitting up in bed Continues to report pain associated with dressing changes.  This morning she has received her pain medications and is more comfortable.  Seen during dialysis.   HEMODIALYSIS FLOWSHEET:  Blood Flow Rate (mL/min): 400 mL/min Arterial Pressure (mmHg): -140 mmHg Venous Pressure (mmHg): 170 mmHg TMP (mmHg): 1 mmHg Ultrafiltration Rate (mL/min): 828 mL/min Dialysate Flow Rate (mL/min): 300 ml/min Dialysis Fluid Bolus: Normal Saline Bolus Amount (mL): 300 mL    Objective:  Vital signs in last 24 hours:  Temp:  [97.6 F (36.4 C)-99.9 F (37.7 C)] 98.4 F (36.9 C) (07/29 0830) Pulse Rate:  [68-99] 99 (07/29 1030) Resp:  [10-16] 16 (07/29 1030) BP: (117-155)/(59-69) 135/63 (07/29 1030) SpO2:  [98 %-100 %] 100 % (07/29 1030)  Weight change:  Filed Weights   02/22/22 0934 02/26/22 1048 02/26/22 1507  Weight: 113.4 kg 101.9 kg 102.1 kg    Intake/Output: I/O last 3 completed shifts: In: 580 [P.O.:480; IV Piggyback:100] Out: -    Intake/Output this shift:  No intake/output data recorded.  Physical Exam: General: NAD, sitting up in bed  Head: Normocephalic,  atraumatic. Moist oral mucosal membranes  Eyes: Anicteric  Lungs:  Clear to auscultation, normal effort, room air  Heart: Regular rate and rhythm  Abdomen:  Soft, nontender, obese  Extremities: 2+ peripheral edema.  Neurologic: Nonfocal, moving all four extremities  Skin: Warm, dry.  Significant decubitus ulcers.  Access: Right chest PermCath    Basic Metabolic Panel: Recent Labs  Lab 02/23/22 0417 02/24/22 0414 02/25/22 0606 02/26/22 1104 02/28/22 0747  NA 136 135 139 135 133*  K 4.5 4.3 3.5 3.9 3.6  CL 102 101 100 98 97*  CO2 20* 22 21* 18* 18*  GLUCOSE 110* 302* 96 166* 97  BUN 27* 40* 34* 46* 35*  CREATININE 3.41* 4.64* 3.55* 4.40* 4.14*  CALCIUM 8.3* 8.8* 8.8* 8.7* 8.7*  MG 1.8  --   --   --   --   PHOS 3.0 3.7 2.8  --  4.0     Liver Function Tests: Recent Labs  Lab 02/22/22 0930 02/23/22 0417 02/24/22 0414 02/25/22 0606 02/28/22 0747  AST 23 18  --   --   --   ALT 20 15  --   --   --   ALKPHOS 190* 145*  --   --   --   BILITOT 1.1 1.0  --   --   --   PROT 6.7 5.2*  --   --   --   ALBUMIN 2.4* 1.8* 1.9* 2.1* 1.8*    No results for input(s): "LIPASE", "AMYLASE" in the last 168 hours. No results for input(s): "AMMONIA" in the last 168 hours.  CBC: Recent Labs  Lab 02/23/22 0740 02/23/22 1459 02/24/22 0414 02/25/22 0606 02/26/22 1104 02/28/22 3300  WBC 20.8*  --  14.4* 17.2* 9.8 14.8*  NEUTROABS 14.9*  --  10.9* 13.8* 7.6  --   HGB 7.1* 6.5* 6.6* 8.7* 8.7* 8.0*  HCT 24.0* 21.5* 21.1* 27.8* 28.3* 24.7*  MCV 93.4  --  92.5 89.4 91.3 87.6  PLT 346  --  333 305 318 272     Cardiac Enzymes: Recent Labs  Lab 02/22/22 0930  CKTOTAL 30*     BNP: Invalid input(s): "POCBNP"  CBG: Recent Labs  Lab 02/27/22 0746 02/27/22 1145 02/27/22 1629 02/27/22 2050 02/28/22 0814  GLUCAP 116* 90 152* 125* 42     Microbiology: Results for orders placed or performed during the hospital encounter of 02/22/22  Blood culture (routine x 2)     Status:  None   Collection Time: 02/22/22 10:29 AM   Specimen: BLOOD  Result Value Ref Range Status   Specimen Description BLOOD RIGHT ANTECUBITAL  Final   Special Requests   Final    BOTTLES DRAWN AEROBIC AND ANAEROBIC Blood Culture results may not be optimal due to an inadequate volume of blood received in culture bottles   Culture   Final    NO GROWTH 5 DAYS Performed at [facility], [address] [place], [place] [address]    Report Status 02/27/2022 FINAL  Final  Blood culture (routine x 2)     Status: None   Collection Time: 02/22/22 10:29 AM   Specimen: BLOOD  Result Value Ref Range Status   Specimen Description BLOOD Blood Culture adequate volume  Final   Special Requests   Final    BOTTLES DRAWN AEROBIC AND ANAEROBIC LEFT ANTECUBITAL   Culture   Final    NO GROWTH 5 DAYS Performed at [facility], [address] [place], [place] [address]    Report Status 02/27/2022 FINAL  Final  Gastrointestinal Panel by PCR , Stool     Status: None   Collection Time: 02/25/22  5:50 PM   Specimen: Stool  Result Value Ref Range Status   Campylobacter species NOT DETECTED NOT DETECTED Final   Plesimonas shigelloides NOT DETECTED NOT DETECTED Final   Salmonella species NOT DETECTED NOT DETECTED Final   Yersinia enterocolitica NOT DETECTED NOT DETECTED Final   Vibrio species NOT DETECTED NOT DETECTED Final   Vibrio cholerae NOT DETECTED NOT DETECTED Final   Enteroaggregative E coli (EAEC) NOT DETECTED NOT DETECTED Final   Enteropathogenic E coli (EPEC) NOT DETECTED NOT DETECTED Final   Enterotoxigenic E coli (ETEC) NOT DETECTED NOT DETECTED Final   Shiga like toxin producing E coli (STEC) NOT DETECTED NOT DETECTED Final   Shigella/Enteroinvasive E coli (EIEC) NOT DETECTED NOT DETECTED Final   Cryptosporidium NOT DETECTED NOT DETECTED Final   Cyclospora cayetanensis NOT DETECTED NOT DETECTED Final   Entamoeba histolytica NOT DETECTED NOT DETECTED Final   Giardia lamblia  NOT DETECTED NOT DETECTED Final   Adenovirus F40/41 NOT DETECTED NOT DETECTED Final   Astrovirus NOT DETECTED NOT DETECTED Final   Norovirus GI/GII NOT DETECTED NOT DETECTED Final   Rotavirus A NOT DETECTED NOT DETECTED Final   Sapovirus (I, II, IV, and V) NOT DETECTED NOT DETECTED Final    Comment: Performed at [facility], [address] [place], [place] [address]    Coagulation Studies: No results for input(s): "LABPROT", "INR" in the last 72 hours.  Urinalysis: No results for input(s): "COLORURINE", "LABSPEC", "PHURINE", "GLUCOSEU", "HGBUR", "BILIRUBINUR", "KETONESUR", "PROTEINUR", "UROBILINOGEN", "NITRITE", "LEUKOCYTESUR" in the last 72 hours.  Invalid  input(s): "APPERANCEUR"    Imaging: No results found.   Medications:     ceFAZolin (ANCEF) IV     metronidazole 500 mg (02/27/22 2219)   sodium thiosulfate 25 g in sodium chloride 0.9 % 200 mL Infusion for Calciphylaxis 25 g (02/26/22 1409)    (feeding supplement) PROSource Plus  30 mL Oral TID BM   allopurinol  100 mg Oral Daily   ascorbic acid  500 mg Oral BID   atorvastatin  40 mg Oral Daily   Chlorhexidine Gluconate Cloth  6 each Topical Daily   cholestyramine light  4 g Oral BID   epoetin (EPOGEN/PROCRIT) injection  4,000 Units Intravenous Q T,Th,Sa-HD   fentaNYL  1 patch Transdermal Q72H   fluticasone  2 spray Each Nare Daily   gabapentin  200 mg Oral TID   heparin injection (subcutaneous)  5,000 Units Subcutaneous Q8H   insulin aspart  0-6 Units Subcutaneous TID WC   insulin aspart  3 Units Subcutaneous TID WC   insulin glargine-yfgn  15 Units Subcutaneous Daily   loratadine  10 mg Oral Daily   metoprolol tartrate  25 mg Oral BID   multivitamin  1 tablet Oral QHS   nutrition supplement (JUVEN)  1 packet Oral BID BM   omega-3 acid ethyl esters  1 g Oral Daily   pantoprazole  40 mg Oral Q0600   Ensure Max Protein  11 oz Oral BID   psyllium  1 packet Oral Daily   simethicone  80 mg Oral QID    acetaminophen, alum & mag hydroxide-simeth, midodrine, ondansetron (ZOFRAN) IV, oxyCODONE, polyethylene glycol  Assessment/ Plan:  Ms. Zaina Jenkin is a 66 y.o.  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  Patient presents to the emergency department after suffering a mechanical fall at nursing facility.  Patient has been admitted for ESRD (end stage renal disease) ([place]) [N18.6] Wound of buttock, unspecified laterality, initial encounter [S31.809A] Sepsis ([place]) [A41.9] Sepsis, due to unspecified organism, unspecified whether acute organ dysfunction present ([place]) [A41.9]  [facility] [place]/TTS/Rt Permcath  End-stage renal disease on hemodialysis.  Will maintain outpatient schedule if possible.  Patient with volume overload today.  Has significant leg edema.  Plan for ultrafiltration of 2 L as tolerated.  Renal navigator aware of patient and monitoring discharge plan to assess outpatient needs.  2. Anemia of chronic kidney disease Lab Results  Component Value Date   HGB 8.0 (L) 02/28/2022   Patient receives Mircera biweekly outpatient.  We will continue to monitor. EPO with HD  3. Secondary Hyperparathyroidism:  Lab Results  Component Value Date   PTH 23 01/15/2022   CALCIUM 8.7 (L) 02/28/2022   CAION 1.11 (L) 01/01/2022   PHOS 4.0 02/28/2022   .  We will continue to monitor bone minerals during this admission.  4.  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  Patient will receive sodium thiosulfate with dialysis treatments.   5. Diabetes mellitus type II with chronic kidney disease: insulin dependent. Most recent hemoglobin A1c is 10.0 on 12/18/21.     LOS: 6 Aubreyanna Dorrough Candiss Norse 7/29/202310:49 AM   Patient was examined and evaluated with Colon Flattery, NP.  Plan of care was formulated and discussed with patient as  well as NP.  I agree with the note as documented. 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

## 2022-02-28 NOTE — Progress Notes (Signed)
Pt completed 3.5 hr HD tx, 2L fluid removed, post weight 101.7 kg, 84L BVP, report to primary RN, pt alert and stable.

## 2022-02-28 NOTE — Plan of Care (Signed)
  Problem: Coping: Goal: Ability to adjust to condition or change in health will improve 02/28/2022 0049 by Suzette Battiest, LPN Outcome: Not Progressing   Problem: Fluid Volume: Goal: Ability to maintain a balanced intake and output will improve 02/28/2022 0049 by Suzette Battiest, LPN Outcome: Not Progressing   Problem: Health Behavior/Discharge Planning: Goal: Ability to manage health-related needs will improve 02/28/2022 0049 by Suzette Battiest, LPN Outcome: Not Progressing   Problem: Metabolic: Goal: Ability to maintain appropriate glucose levels will improve 02/28/2022 0049 by Suzette Battiest, LPN Outcome: Not Progressing   Problem: Skin Integrity: Goal: Risk for impaired skin integrity will decrease 02/28/2022 0049 by Suzette Battiest, LPN Outcome: Not Progressing   Problem: Tissue Perfusion: Goal: Adequacy of tissue perfusion will improve 02/28/2022 0049 by Suzette Battiest, LPN Outcome: Not Progressing   Problem: Clinical Measurements: Goal: Ability to maintain clinical measurements within normal limits will improve 02/28/2022 0049 by Suzette Battiest, LPN Outcome: Not Progressing   Problem: Clinical Measurements: Goal: Will remain free from infection 02/28/2022 0049 by Suzette Battiest, LPN Outcome: Not Progressing   Problem: Clinical Measurements: Goal: Diagnostic test results will improve 02/28/2022 0049 by Suzette Battiest, LPN Outcome: Not Progressing   Problem: Clinical Measurements: Goal: Respiratory complications will improve 02/28/2022 0048 by Suzette Battiest, LPN Outcome: Progressing   Problem: Clinical Measurements: Goal: Cardiovascular complication will be avoided 02/28/2022 0049 by Suzette Battiest, LPN Outcome: Not Progressing   Problem: Nutrition: Goal: Adequate nutrition will be maintained 02/28/2022 0049 by Suzette Battiest, LPN Outcome: Not Progressing   Problem: Coping: Goal: Level of anxiety will decrease 02/28/2022 0049 by Suzette Battiest,  LPN Outcome: Not Progressing   Problem: Pain Managment: Goal: General experience of comfort will improve 02/28/2022 0048 by Suzette Battiest, LPN Outcome: Progressing   Problem: Safety: Goal: Ability to remain free from injury will improve 02/28/2022 0049 by Suzette Battiest, LPN Outcome: Not Progressing   Problem: Skin Integrity: Goal: Risk for impaired skin integrity will decrease 02/28/2022 0049 by Suzette Battiest, LPN Outcome: Not Progressing

## 2022-02-28 NOTE — Progress Notes (Signed)
Pre HD RN assessment 

## 2022-02-28 NOTE — Progress Notes (Signed)
Post HD vitals 

## 2022-02-28 NOTE — Progress Notes (Signed)
Delay in patient being transported to HD this AM r/t pain and wounds.

## 2022-03-01 DIAGNOSIS — G894 Chronic pain syndrome: Secondary | ICD-10-CM | POA: Diagnosis not present

## 2022-03-01 DIAGNOSIS — A419 Sepsis, unspecified organism: Secondary | ICD-10-CM | POA: Diagnosis not present

## 2022-03-01 DIAGNOSIS — R6520 Severe sepsis without septic shock: Secondary | ICD-10-CM | POA: Diagnosis not present

## 2022-03-01 LAB — CBC WITH DIFFERENTIAL/PLATELET
Abs Immature Granulocytes: 0.11 10*3/uL — ABNORMAL HIGH (ref 0.00–0.07)
Basophils Absolute: 0.1 10*3/uL (ref 0.0–0.1)
Basophils Relative: 1 %
Eosinophils Absolute: 0.2 10*3/uL (ref 0.0–0.5)
Eosinophils Relative: 2 %
HCT: 22.6 % — ABNORMAL LOW (ref 36.0–46.0)
Hemoglobin: 7.4 g/dL — ABNORMAL LOW (ref 12.0–15.0)
Immature Granulocytes: 1 %
Lymphocytes Relative: 13 %
Lymphs Abs: 1.3 10*3/uL (ref 0.7–4.0)
MCH: 29.1 pg (ref 26.0–34.0)
MCHC: 32.7 g/dL (ref 30.0–36.0)
MCV: 89 fL (ref 80.0–100.0)
Monocytes Absolute: 0.9 10*3/uL (ref 0.1–1.0)
Monocytes Relative: 8 %
Neutro Abs: 8.1 10*3/uL — ABNORMAL HIGH (ref 1.7–7.7)
Neutrophils Relative %: 75 %
Platelets: 207 10*3/uL (ref 150–400)
RBC: 2.54 MIL/uL — ABNORMAL LOW (ref 3.87–5.11)
RDW: 18.7 % — ABNORMAL HIGH (ref 11.5–15.5)
WBC: 10.6 10*3/uL — ABNORMAL HIGH (ref 4.0–10.5)
nRBC: 0 % (ref 0.0–0.2)

## 2022-03-01 LAB — GLUCOSE, CAPILLARY
Glucose-Capillary: 185 mg/dL — ABNORMAL HIGH (ref 70–99)
Glucose-Capillary: 158 mg/dL — ABNORMAL HIGH (ref 70–99)
Glucose-Capillary: 127 mg/dL — ABNORMAL HIGH (ref 70–99)
Glucose-Capillary: 175 mg/dL — ABNORMAL HIGH (ref 70–99)

## 2022-03-01 LAB — BASIC METABOLIC PANEL
Anion gap: 17 — ABNORMAL HIGH (ref 5–15)
BUN: 19 mg/dL (ref 8–23)
CO2: 23 mmol/L (ref 22–32)
Calcium: 8.6 mg/dL — ABNORMAL LOW (ref 8.9–10.3)
Chloride: 97 mmol/L — ABNORMAL LOW (ref 98–111)
Creatinine, Ser: 3.09 mg/dL — ABNORMAL HIGH (ref 0.44–1.00)
GFR, Estimated: 16 mL/min — ABNORMAL LOW (ref >60)
Glucose, Bld: 174 mg/dL — ABNORMAL HIGH (ref 70–99)
Potassium: 3.9 mmol/L (ref 3.5–5.1)
Sodium: 137 mmol/L (ref 135–145)

## 2022-03-01 LAB — MAGNESIUM: Magnesium: 1.7 mg/dL (ref 1.7–2.4)

## 2022-03-01 NOTE — Plan of Care (Signed)
  Problem: Health Behavior/Discharge Planning: Goal: Ability to manage health-related needs will improve Outcome: Progressing   Problem: Nutritional: Goal: Maintenance of adequate nutrition will improve Outcome: Progressing   Problem: Clinical Measurements: Goal: Diagnostic test results will improve Outcome: Progressing   Problem: Activity: Goal: Risk for activity intolerance will decrease Outcome: Progressing   Problem: Nutrition: Goal: Adequate nutrition will be maintained Outcome: Progressing   Problem: Coping: Goal: Level of anxiety will decrease Outcome: Progressing

## 2022-03-01 NOTE — Progress Notes (Signed)
PROGRESS NOTE    Theresa Warren  EGB:[id number] DOB: 04/10/1956 DOA: 02/22/2022 PCP: Crist Infante, MD    Brief Narrative:  66 y.o. 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     The patient presents to [facility] ED from SNF via EMS due to a fall, she slid off her bed.  Imaging in the ED is reassuring, no fractures.  Noted to be tachycardic with leukocytosis greater than 21000 with concern for sepsis secondary to necrotic sacral decubitus wounds.  The patient was started on empiric broad-spectrum IV antibiotics, IV vancomycin, Rocephin and IV Flagyl.  EDP discussed the case with general surgery.  Patient underwent bedside debridement by general surgery.    Hospital course and discharge planning delayed and complicated by intractable pain.  Patient is opioid tolerant and presence of ESRD makes it difficult to achieve adequate pain control  Assessment & Plan:   Principal Problem:   Sepsis ([place]) Active Problems:   Chronic pain syndrome   Anemia of renal disease   Type 2 diabetes mellitus with chronic kidney disease, with long-term current use of insulin (HCC)   Severe obesity (BMI >= 40) (HCC)   ESRD (end stage renal disease) (HCC)   Wound of buttock   Bilateral lower extremity edema   Anemia due to chronic kidney disease, on chronic dialysis ([place])   Hypertension  Sepsis secondary to necrotic sacral decubitus wounds, POA -Patient presented with criteria for sepsis with leukocytosis, tachycardia, sacral pain with concern for infected necrotic sacral decubitus wounds. -Status post bedside debridement by general surgery -Blood cultures no growth to  date -Leukocytosis persistent Plan: Continue Ancef and Flagyl for now DC abx on dc Multimodal pain control, attempt to minimize use of p.o. oxycodone No IV narcotics WOC follow-up Regular dressing changes  Acute diarrhea, resolved Noted initially on 7/25.  Bedside RN on 7/26 notes persistent watery diarrhea.  No fever.  Positive leukocytosis.  No abdominal cramping. Plan: At this time clinical suspicion for C. difficile is low.  Infectious disease consulted for comment.   KUB reassuring Attempted Questran, patient could not tolerate GI PCR panel negative Discontinued rectal tube 7/29  End-stage renal disease on HD TTS Possible calciphylaxis Nephrology following patient while in house.   Concern for calciphylaxis.  Is on sodium thiosulfate Outpatient referral to dermatology Dr. Laurence Ferrari  Chronic bilateral lower extremity edema -May be secondary to volume overload from end-stage renal disease. -Patient noted to have had bilateral lower extremity Doppler ultrasound on 01/12/2022 with no evidence of DVT. -Keep lower extremities elevated. -Nephrology consulted for hemodialysis with fluid removal as tolerated  Type 2 diabetes mellitus with hyperglycemia -Last hemoglobin A1c 10.0 on 12/18/2021.  Poor control -Continue Semglee 15 units daily.  NovoLog 3 units 3 times daily with meals.  SSI.   Anemia -Likely anemia of chronic disease secondary to end-stage renal disease. -Hemoglobin at 6.6 from 7.1 from 7.9 on admission, was 9.0 on 02/06/2022. -Patient with no overt bleeding. -Anemia panel consistent with anemia of chronic disease.   -Transfused 2 units packed red blood cells during hemodialysis 7/25.   -Follow-up H&H reassuring -Transfusion threshold hemoglobin < 7.  -If patient needs another transfusion will likely need to be given during HD.  Hyperlipidemia -Continue Lipitor.  Diabetic polyneuropathy/chronic pain -Continue home regimen Neurontin. -Continue home regiment pain  medications  Physical deconditioning with fall -PT/OT. -Fall precautions. -Likely needs SNF placement. -TOC consult. -Palliative care consulted 7/27  Hypertension -Patient noted to have soft blood pressures on presentation. -BP improving.   -Continue to hold home oral antihypertensive medications and could likely start resuming tomorrow.   -DC scheduled midodrine  Severe obesity/morbid obesity -Lifestyle modification. -Outpatient follow-up with PCP.   endometrial thickness -Noted on CT abdomen and pelvis with a endometrial canal measuring 13 mm thickness in the lower urinary segment. -Pelvic ultrasound with thickened endometrium measuring up to 11 mm.  Trace fluid within the endometrial canal at the lower uterine segment.  Endometrial sampling is indicated to exclude carcinoma.  Nonvisualization of bilateral ovaries.  Layering debris noted within the urinary bladder which could be secondary to stasis possibly blood products.  Correlate with urinalysis. -Phone consultation was done with OB/GYN, Dr. Rolly Salter who reviewed the charts, reviewed the pelvic ultrasound and recommended outpatient follow-up with GYN for endometrial biopsy. -See follow-up in discharge navigator.  Follow-up with Dr. Hulan Fray OB/GYN    DVT prophylaxis: SQ heparin Code Status: Full Family Communication: None today Disposition Plan: Status is: Inpatient Remains inpatient appropriate because: Sepsis on broad-spectrum IV antibiotics.  Intractable pain.  Improving, anticipate dc tomorrow AM   Level of care: Med-Surg  Consultants:  ID Palliative care  Procedures:  Bedside debridement of sacral ulcer 7/25  Antimicrobials: Flagyl Cefazolin   Subjective: Seen and examined.  Improved pain control today.  Endorses some fatigue/lethargy  Objective: Vitals:   02/28/22 1307 02/28/22 1514 02/28/22 1959 03/01/22 0736  BP: (!) 128/56 (!) 136/55 130/66 137/60  Pulse: 96 93 91 83  Resp: 18 16 17 16   Temp: 98.2 F  (36.8 C) 99.3 F (37.4 C) 99 F (37.2 C) 98.4 F (36.9 C)  TempSrc:  Oral    SpO2: 100% 100% 99% 100%  Weight:      Height:        Intake/Output Summary (Last 24 hours) at 03/01/2022 1336 Last data filed at 02/28/2022 2142 Gross per 24 hour  Intake 860 ml  Output --  Net 860 ml   Filed Weights   02/26/22 1507 02/28/22 0824 02/28/22 1225  Weight: 102.1 kg 102.8 kg 101.7 kg    Examination:  General exam: NAD Respiratory system: Clear lungs.  Normal work of breathing.  Room air Cardiovascular system: S1-S2, RRR, no murmurs, 1+ pedal edema BLE Gastrointestinal system: Obese, soft, NT/ND, normal bowel sounds Central nervous system: Alert and oriented. No focal neurological deficits. Extremities: Symmetric 5 x 5 power. Skin: Sacral decubitus ulcers with areas of necrotic eschar Psychiatry: Judgement and insight appear normal. Mood & affect appropriate.     Data Reviewed: I have personally reviewed following labs and imaging studies  CBC: Recent Labs  Lab 02/23/22 0740 02/23/22 1459 02/24/22 0414 02/25/22 0606 02/26/22 1104 02/28/22 0641 03/01/22 0934  WBC 20.8*  --  14.4* 17.2* 9.8 14.8* 10.6*  NEUTROABS 14.9*  --  10.9* 13.8* 7.6  --  8.1*  HGB 7.1*   < > 6.6* 8.7* 8.7* 8.0* 7.4*  HCT 24.0*   < > 21.1* 27.8* 28.3* 24.7* 22.6*  MCV 93.4  --  92.5 89.4 91.3 87.6 89.0  PLT 346  --  333 305 318 272 207   < > = values in this interval not displayed.   Basic Metabolic Panel: Recent Labs  Lab 02/23/22 0417 02/24/22 0414 02/25/22 0606 02/26/22 1104  02/28/22 0747 03/01/22 0934  NA 136 135 139 135 133* 137  K 4.5 4.3 3.5 3.9 3.6 3.9  CL 102 101 100 98 97* 97*  CO2 20* 22 21* 18* 18* 23  GLUCOSE 110* 302* 96 166* 97 174*  BUN 27* 40* 34* 46* 35* 19  CREATININE 3.41* 4.64* 3.55* 4.40* 4.14* 3.09*  CALCIUM 8.3* 8.8* 8.8* 8.7* 8.7* 8.6*  MG 1.8  --   --   --   --  1.7  PHOS 3.0 3.7 2.8  --  4.0  --    GFR: Estimated Creatinine Clearance: 20.5 mL/min (A) (by C-G  formula based on SCr of 3.09 mg/dL (H)). Liver Function Tests: Recent Labs  Lab 02/23/22 0417 02/24/22 0414 02/25/22 0606 02/28/22 0747  AST 18  --   --   --   ALT 15  --   --   --   ALKPHOS 145*  --   --   --   BILITOT 1.0  --   --   --   PROT 5.2*  --   --   --   ALBUMIN 1.8* 1.9* 2.1* 1.8*   No results for input(s): "LIPASE", "AMYLASE" in the last 168 hours. No results for input(s): "AMMONIA" in the last 168 hours. Coagulation Profile: No results for input(s): "INR", "PROTIME" in the last 168 hours. Cardiac Enzymes: No results for input(s): "CKTOTAL", "CKMB", "CKMBINDEX", "TROPONINI" in the last 168 hours.  BNP (last 3 results) No results for input(s): "PROBNP" in the last 8760 hours. HbA1C: No results for input(s): "HGBA1C" in the last 72 hours. CBG: Recent Labs  Lab 02/28/22 1304 02/28/22 1707 02/28/22 2042 03/01/22 0738 03/01/22 1204  GLUCAP 129* 152* 170* 185* 158*   Lipid Profile: No results for input(s): "CHOL", "HDL", "LDLCALC", "TRIG", "CHOLHDL", "LDLDIRECT" in the last 72 hours. Thyroid Function Tests: No results for input(s): "TSH", "T4TOTAL", "FREET4", "T3FREE", "THYROIDAB" in the last 72 hours. Anemia Panel: No results for input(s): "VITAMINB12", "FOLATE", "FERRITIN", "TIBC", "IRON", "RETICCTPCT" in the last 72 hours.  Sepsis Labs: No results for input(s): "PROCALCITON", "LATICACIDVEN" in the last 168 hours.   Recent Results (from the past 240 hour(s))  Blood culture (routine x 2)     Status: None   Collection Time: 02/22/22 10:29 AM   Specimen: BLOOD  Result Value Ref Range Status   Specimen Description BLOOD RIGHT ANTECUBITAL  Final   Special Requests   Final    BOTTLES DRAWN AEROBIC AND ANAEROBIC Blood Culture results may not be optimal due to an inadequate volume of blood received in culture bottles   Culture   Final    NO GROWTH 5 DAYS Performed at [facility], [address], [place], [place] [address]    Report Status  02/27/2022 FINAL  Final  Blood culture (routine x 2)     Status: None   Collection Time: 02/22/22 10:29 AM   Specimen: BLOOD  Result Value Ref Range Status   Specimen Description BLOOD Blood Culture adequate volume  Final   Special Requests   Final    BOTTLES DRAWN AEROBIC AND ANAEROBIC LEFT ANTECUBITAL   Culture   Final    NO GROWTH 5 DAYS Performed at [facility], [address] [place], [place] [address]    Report Status 02/27/2022 FINAL  Final  Gastrointestinal Panel by PCR , Stool     Status: None   Collection Time: 02/25/22  5:50 PM   Specimen: Stool  Result Value Ref Range Status  Campylobacter species NOT DETECTED NOT DETECTED Final   Plesimonas shigelloides NOT DETECTED NOT DETECTED Final   Salmonella species NOT DETECTED NOT DETECTED Final   Yersinia enterocolitica NOT DETECTED NOT DETECTED Final   Vibrio species NOT DETECTED NOT DETECTED Final   Vibrio cholerae NOT DETECTED NOT DETECTED Final   Enteroaggregative E coli (EAEC) NOT DETECTED NOT DETECTED Final   Enteropathogenic E coli (EPEC) NOT DETECTED NOT DETECTED Final   Enterotoxigenic E coli (ETEC) NOT DETECTED NOT DETECTED Final   Shiga like toxin producing E coli (STEC) NOT DETECTED NOT DETECTED Final   Shigella/Enteroinvasive E coli (EIEC) NOT DETECTED NOT DETECTED Final   Cryptosporidium NOT DETECTED NOT DETECTED Final   Cyclospora cayetanensis NOT DETECTED NOT DETECTED Final   Entamoeba histolytica NOT DETECTED NOT DETECTED Final   Giardia lamblia NOT DETECTED NOT DETECTED Final   Adenovirus F40/41 NOT DETECTED NOT DETECTED Final   Astrovirus NOT DETECTED NOT DETECTED Final   Norovirus GI/GII NOT DETECTED NOT DETECTED Final   Rotavirus A NOT DETECTED NOT DETECTED Final   Sapovirus (I, II, IV, and V) NOT DETECTED NOT DETECTED Final    Comment: Performed at [facility], [address] [place], [place] [address]         Radiology Studies: No results found.      Scheduled  Meds:  (feeding supplement) PROSource Plus  30 mL Oral TID BM   allopurinol  100 mg Oral Daily   alum & mag hydroxide-simeth  30 mL Oral Q6H   ascorbic acid  500 mg Oral BID   atorvastatin  40 mg Oral Daily   Chlorhexidine Gluconate Cloth  6 each Topical Daily   cholestyramine light  4 g Oral BID   epoetin (EPOGEN/PROCRIT) injection  4,000 Units Intravenous Q T,Th,Sa-HD   fentaNYL  1 patch Transdermal Q72H   fluticasone  2 spray Each Nare Daily   gabapentin  200 mg Oral TID   heparin injection (subcutaneous)  5,000 Units Subcutaneous Q8H   insulin aspart  0-6 Units Subcutaneous TID WC   insulin aspart  3 Units Subcutaneous TID WC   insulin glargine-yfgn  15 Units Subcutaneous Daily   loratadine  10 mg Oral Daily   metoprolol tartrate  25 mg Oral BID   multivitamin  1 tablet Oral QHS   nutrition supplement (JUVEN)  1 packet Oral BID BM   omega-3 acid ethyl esters  1 g Oral Daily   pantoprazole  40 mg Oral Q0600   Ensure Max Protein  11 oz Oral BID   psyllium  1 packet Oral Daily   simethicone  80 mg Oral QID   Continuous Infusions:   ceFAZolin (ANCEF) IV 1 g (03/01/22 1328)   metronidazole 500 mg (03/01/22 0959)   sodium thiosulfate 25 g in sodium chloride 0.9 % 200 mL Infusion for Calciphylaxis 25 g (02/28/22 1113)     LOS: 7 days       Sidney Ace, MD [facility]   If 7PM-7AM, please contact night-coverage  03/01/2022, 1:36 PM

## 2022-03-02 DIAGNOSIS — A419 Sepsis, unspecified organism: Secondary | ICD-10-CM | POA: Diagnosis not present

## 2022-03-02 DIAGNOSIS — R6520 Severe sepsis without septic shock: Secondary | ICD-10-CM | POA: Diagnosis not present

## 2022-03-02 DIAGNOSIS — S31829D Unspecified open wound of left buttock, subsequent encounter: Secondary | ICD-10-CM | POA: Diagnosis not present

## 2022-03-02 LAB — BASIC METABOLIC PANEL
Anion gap: 20 — ABNORMAL HIGH (ref 5–15)
BUN: 25 mg/dL — ABNORMAL HIGH (ref 8–23)
CO2: 21 mmol/L — ABNORMAL LOW (ref 22–32)
Calcium: 9 mg/dL (ref 8.9–10.3)
Chloride: 96 mmol/L — ABNORMAL LOW (ref 98–111)
Creatinine, Ser: 3.75 mg/dL — ABNORMAL HIGH (ref 0.44–1.00)
GFR, Estimated: 13 mL/min — ABNORMAL LOW (ref >60)
Glucose, Bld: 143 mg/dL — ABNORMAL HIGH (ref 70–99)
Potassium: 4.2 mmol/L (ref 3.5–5.1)
Sodium: 137 mmol/L (ref 135–145)

## 2022-03-02 LAB — CBC WITH DIFFERENTIAL/PLATELET
Abs Immature Granulocytes: 0.09 10*3/uL — ABNORMAL HIGH (ref 0.00–0.07)
Basophils Absolute: 0.1 10*3/uL (ref 0.0–0.1)
Basophils Relative: 1 %
Eosinophils Absolute: 0.5 10*3/uL (ref 0.0–0.5)
Eosinophils Relative: 4 %
HCT: 24.2 % — ABNORMAL LOW (ref 36.0–46.0)
Hemoglobin: 7.7 g/dL — ABNORMAL LOW (ref 12.0–15.0)
Immature Granulocytes: 1 %
Lymphocytes Relative: 15 %
Lymphs Abs: 1.7 10*3/uL (ref 0.7–4.0)
MCH: 28.3 pg (ref 26.0–34.0)
MCHC: 31.8 g/dL (ref 30.0–36.0)
MCV: 89 fL (ref 80.0–100.0)
Monocytes Absolute: 0.7 10*3/uL (ref 0.1–1.0)
Monocytes Relative: 6 %
Neutro Abs: 8.5 10*3/uL — ABNORMAL HIGH (ref 1.7–7.7)
Neutrophils Relative %: 73 %
Platelets: 256 10*3/uL (ref 150–400)
RBC: 2.72 MIL/uL — ABNORMAL LOW (ref 3.87–5.11)
RDW: 18.6 % — ABNORMAL HIGH (ref 11.5–15.5)
WBC: 11.5 10*3/uL — ABNORMAL HIGH (ref 4.0–10.5)
nRBC: 0 % (ref 0.0–0.2)

## 2022-03-02 LAB — GLUCOSE, CAPILLARY
Glucose-Capillary: 152 mg/dL — ABNORMAL HIGH (ref 70–99)
Glucose-Capillary: 121 mg/dL — ABNORMAL HIGH (ref 70–99)
Glucose-Capillary: 166 mg/dL — ABNORMAL HIGH (ref 70–99)

## 2022-03-02 MED ORDER — SIMETHICONE 80 MG PO CHEW
80.0000 mg | CHEWABLE_TABLET | Freq: Four times a day (QID) | ORAL | 0 refills | Status: AC
Start: 2022-03-02 — End: ?

## 2022-03-02 MED ORDER — FENTANYL 25 MCG/HR TD PT72: 1.0000 | MEDICATED_PATCH | TRANSDERMAL | 0 refills | Status: DC

## 2022-03-02 MED ORDER — OXYCODONE HCL 5 MG PO TABS
5.0000 mg | ORAL_TABLET | Freq: Four times a day (QID) | ORAL | 0 refills | Status: AC | PRN
Start: 2022-03-02 — End: ?

## 2022-03-02 MED ORDER — PSYLLIUM 95 % PO PACK: 1.0000 | PACK | Freq: Every day | ORAL | Status: AC

## 2022-03-02 NOTE — Progress Notes (Signed)
Pt completed 3.5 hr hd tx, goal of 2L met, 84L BVP, catheter limbs wrapped w/ gauze per pt request.

## 2022-03-02 NOTE — Progress Notes (Signed)
EMS has transported patient to [facility], no distress when leaving the flooor.

## 2022-03-02 NOTE — Progress Notes (Signed)
Post HD vitals 

## 2022-03-02 NOTE — Progress Notes (Signed)
[facility] was called and report was given to nurse Rolla Plate. Patient is waiting to be transported.

## 2022-03-02 NOTE — Progress Notes (Signed)
Patient refused to change the dressing at midnight, asked to change her dressing in the morning while checking  if she had a bowel movement, patient said "stop bugging me". Patient educate the importance of dressing change.

## 2022-03-02 NOTE — Care Management Important Message (Signed)
Important Message  Patient Details  Name: Theresa Warren MRN: [id number] Date of Birth: 15-Jun-1956   Medicare Important Message Given:  Yes     Juliann Pulse A Keonna Raether 03/02/2022, 1:46 PM

## 2022-03-02 NOTE — TOC Progression Note (Signed)
Transition of Care [facility]) - Progression Note    Patient Details  Name: [name] MRN: [id number] Date of Birth: Apr 26, 1956  [facility]) CM/SW Contact  Eileen Stanford, LCSW Phone Number: 03/02/2022, 9:02 AM  Clinical Narrative:   Josem Kaufmann obtained and information provided to Tammy with [facility]    Expected Discharge Plan: [place] Barriers to Discharge: Continued Medical Work up  Expected Discharge Plan and Services Expected Discharge Plan: [place] In-house Referral: Clinical Social Work   Post Acute Care Choice: [place] Living arrangements for the past 2 months: Single Family Home                                       Social Determinants of Health (SDOH) Interventions    Readmission Risk Interventions     No data to display

## 2022-03-02 NOTE — Progress Notes (Signed)
Post HD RN assessment 

## 2022-03-02 NOTE — Plan of Care (Signed)
  Problem: Nutritional: Goal: Maintenance of adequate nutrition will improve Outcome: Progressing   Problem: Clinical Measurements: Goal: Diagnostic test results will improve Outcome: Progressing   Problem: Activity: Goal: Risk for activity intolerance will decrease Outcome: Progressing   Problem: Nutrition: Goal: Adequate nutrition will be maintained Outcome: Progressing   Problem: Coping: Goal: Level of anxiety will decrease Outcome: Progressing   Problem: Pain Managment: Goal: General experience of comfort will improve Outcome: Progressing

## 2022-03-02 NOTE — Discharge Summary (Signed)
Physician Discharge Summary  Theresa Warren NAT:[id number] DOB: [birth year] DOA: 02/22/2022  PCP: Crist Infante, MD  Admit date: 02/22/2022 Discharge date: 03/02/2022  Admitted From: SNF Disposition:  SNF  Recommendations for Outpatient Follow-up:  Follow up with PCP in 1-2 weeks Follow-up with dermatology ASAP Follow-up with OB/GYN ASAP  Home Health: No Equipment/Devices: None  Discharge Condition: Stable CODE STATUS: DNR Diet recommendation: Renal with fluid restriction  Brief/Interim Summary: 66 y.o. 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     The patient presents to [facility] ED from SNF via EMS due to a fall, she slid off her bed.  Imaging in the ED is reassuring, no fractures.  Noted to be tachycardic with leukocytosis greater than 21000 with concern for sepsis secondary to necrotic sacral decubitus wounds.  The patient was started on empiric broad-spectrum IV antibiotics, IV vancomycin, Rocephin and IV Flagyl.  EDP discussed the case with general surgery.  Patient underwent bedside debridement by general surgery.     Hospital course and discharge planning delayed and complicated by intractable pain.  Patient is opioid tolerant and presence of ESRD makes it difficult to achieve adequate pain control  Pain control optimized as well as possible in house.  Pain is still present however will be difficult to achieve total pain control considering nature of wounds and calciphylaxis.  At this point patient is medically stable for discharge from the hospital.  Per ID no antibiotics indicated on discharge.  Patient underwent successful hemodialysis  on the day of discharge.  Per nephrology can resume prior to arrival dialysis schedule.  Patient will see both OB/GYN and dermatology at time of discharge.  OB/GYN will evaluate for endometrial biopsy in the setting of endometrial thickening noted on imaging.  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  Patient is very complex with multiple advanced medical comorbidities.  She will be high risk for hospital readmission.  If the patient does have to be rehospitalized in the future we strongly recommend admission to a tertiary care center with availability of multidisciplinary surgical teams including plastics and reconstructive as well as inpatient dermatology.    Discharge Diagnoses:  Principal Problem:   Sepsis ([place]) Active Problems:   Chronic pain syndrome   Anemia of renal disease   Type 2 diabetes mellitus with chronic kidney disease, with long-term current use of insulin (HCC)   Severe obesity (BMI >= 40) (HCC)   ESRD (end stage renal disease) (HCC)   Wound of buttock   Bilateral lower extremity edema   Anemia due to chronic kidney disease, on chronic dialysis ([place])   Hypertension  Sepsis secondary to necrotic sacral decubitus wounds, POA -Patient presented with criteria for sepsis with leukocytosis, tachycardia, sacral pain with concern for infected necrotic sacral decubitus wounds. -Status post bedside debridement by general surgery -Blood cultures no growth to date -Leukocytosis persistent Plan: Patient was on antibiotics for total course of admission including 9 days.  Per infectious disease recommendations we will discontinue all antibiotics at time of discharge.  Follow-up outpatient PCP and dermatology.  If patient does require rehospitalization for evaluation of his sacral wounds we recommend admission to a tertiary care facility with multidisciplinary surgical teams including plastics and  reconstructive and potentially  inpatient dermatology.  Patient did undergo bedside debridement of her sacral wounds by general surgery however she remains high risk for these wounds worsening and recurrent sepsis.   Acute diarrhea, resolved Noted initially on 7/25.  Bedside RN on 7/26 notes persistent watery diarrhea.  No fever.  Positive leukocytosis.  No abdominal cramping. Plan: At this time clinical suspicion for C. difficile is low.  Infectious disease consulted for comment.   KUB reassuring Attempted Questran, patient could not tolerate GI PCR panel negative Discontinued rectal tube 7/29 Diarrhea resolved at time of discharge  End-stage renal disease on HD TTS Possible calciphylaxis Nephrology following patient while in house.   Concern for calciphylaxis.  Is on sodium thiosulfate with HD Outpatient referral to dermatology Dr. Laurence Ferrari Patient follow-up with nephrology    Discharge Instructions  Discharge Instructions     Diet - low sodium heart healthy   Complete by: As directed    Increase activity slowly   Complete by: As directed    No wound care   Complete by: As directed       Allergies as of 03/02/2022       Reactions   Codeine Nausea And Vomiting   Other reaction(s): nausea and vomiting, vomiting   Penicillins Rash, Hives   Other reaction(s): systemic rash   Carvedilol    Other reaction(s): high sugar, headache, cough   Fluoxetine    Other reaction(s): took in 1990 . made her worse.   Liraglutide    Other reaction(s): too nauseated.   Pioglitazone    Other reaction(s): edema and osteoporosis   Sulfa Antibiotics Swelling   Clindamycin Hcl Rash   Clindamycin/lincomycin Rash   Dilaudid [hydromorphone Hcl] Rash        Medication List     STOP taking these medications    fexofenadine 180 MG tablet Commonly known as: ALLEGRA   insulin aspart 100 UNIT/ML injection Commonly known as: novoLOG       TAKE these medications    acetaminophen 325 MG tablet Commonly known as:  TYLENOL Take 975 mg by mouth every 6 (six) hours as needed for mild pain.   allopurinol 100 MG tablet Commonly known as: ZYLOPRIM Take 100 mg by mouth daily.   ascorbic acid 500 MG tablet Commonly known as: VITAMIN C Take 1 tablet (500 mg total) by mouth 2 (two) times daily.   atorvastatin 40 MG tablet Commonly known as: LIPITOR Take 40 mg by mouth daily.   cetirizine 10 MG tablet Commonly known as: ZYRTEC Take 10 mg by mouth daily.   epoetin alfa 10000 UNIT/ML injection Commonly known as: EPOGEN Inject 1 mL (10,000 Units total) into the vein every Monday, Wednesday, and Friday with hemodialysis.   fentaNYL 25 MCG/HR Commonly known as: [place] 1 patch onto the skin every 3 (three) days. Refills to be considered by SNF staff Start taking on: March 05, 2022   Fish Oil 500 MG Caps Take by mouth.   fluticasone 50 MCG/ACT nasal spray Commonly known as: Flonase Place 2 sprays into both nostrils daily.   gabapentin 100 MG capsule Commonly known as: NEURONTIN Take 2 capsules (200 mg total) by mouth 3 (three) times daily.   HumaLOG 100 UNIT/ML injection Generic drug: insulin lispro Inject 3 Units into the skin 4 (four) times daily -  before meals and at bedtime.   insulin glargine-yfgn 100 UNIT/ML injection Commonly known as: SEMGLEE Inject 0.15 mLs (15 Units total) into the skin at bedtime.  metoprolol tartrate 25 MG tablet Commonly known as: LOPRESSOR Take 1 tablet (25 mg total) by mouth 2 (two) times daily.   multivitamin Tabs tablet Take 1 tablet by mouth at bedtime.   nutrition supplement (JUVEN) Pack Take 1 packet by mouth 2 (two) times daily between meals.   Ensure Max Protein Liqd Take 330 mLs (11 oz total) by mouth 2 (two) times daily.   nystatin powder Commonly known as: MYCOSTATIN/NYSTOP Apply topically.   oxyCODONE 5 MG immediate release tablet Commonly known as: Oxy IR/ROXICODONE Take 1 tablet (5 mg total) by mouth every 6 (six) hours as  needed for moderate pain or severe pain (can give 10 mg 30 minutes before dressing change). SNF use only.  Refills to be considered by SNF staff What changed: additional instructions   polyethylene glycol 17 g packet Commonly known as: MIRALAX / GLYCOLAX Take 17 g by mouth daily.   psyllium 95 % Pack Commonly known as: HYDROCIL/METAMUCIL Take 1 packet by mouth daily.   senna-docusate 8.6-50 MG tablet Commonly known as: Senokot-S Take 1 tablet by mouth 2 (two) times daily.   simethicone 80 MG chewable tablet Commonly known as: MYLICON Chew 1 tablet (80 mg total) by mouth 4 (four) times daily.   sodium chloride 0.9 % SOLN 100 mL with sodium thiosulfate 250 MG/ML SOLN 25 g Inject 25 g into the vein every Monday, Wednesday, and Friday with hemodialysis.        Follow-up Information     Emily Filbert, MD. Schedule an appointment as soon as possible for a visit in 2 week(s).   Specialty: Obstetrics and Gynecology Why: F/U FOR ENDOMETRIAL BIOPSY Contact information: [place] [address] [phone]         Laurence Ferrari, [place], MD. Schedule an appointment as soon as possible for a visit in 1 week(s).   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 made her worse.   Liraglutide     Other reaction(s): too nauseated.   Pioglitazone     Other reaction(s): edema and osteoporosis   Sulfa Antibiotics Swelling   Clindamycin Hcl Rash   Clindamycin/Lincomycin Rash   Dilaudid [Hydromorphone Hcl] Rash    Consultations: Infectious  disease Nephrology   Procedures/Studies: DG Abd 1 View  Result Date: 02/25/2022 CLINICAL DATA:  Diarrhea.  Abdominal pain. EXAM: ABDOMEN - 1 VIEW COMPARISON:  CT 02/22/2022 FINDINGS: Bowel gas pattern does not suggest ileus or obstruction. Moderate amount of stool in the rectum. Vascular calcification is noted. No acute bone finding. IMPRESSION: Unremarkable bowel gas pattern. Moderate amount of fecal matter in the rectum. Electronically Signed   By: Nelson Chimes M.D.   On: 02/25/2022 13:45   DG Ribs Unilateral Left  Result Date: 02/24/2022 CLINICAL DATA:  Left rib pain. EXAM: LEFT RIBS - 2 VIEW COMPARISON:  None Available. FINDINGS: Seventh anterior rib fracture noted. This is also noted on prior abdominal CT scan 02/22/2022 in peers subacute/healing. IMPRESSION: Seventh anterior rib fracture appears subacute/healing. Electronically Signed   By:  Marijo Sanes M.D.   On: 02/24/2022 11:05   DG Ribs Unilateral W/Chest Right  Result Date: 02/24/2022 CLINICAL DATA:  Right rib pain. EXAM: RIGHT RIBS AND CHEST - 3+ VIEW COMPARISON:  Multiple prior chest x-rays. FINDINGS: The right IJ dialysis catheter is stable. The heart is stable in size. Stable tortuosity and ectasia of the thoracic aorta. Streaky left basilar scarring changes. No pleural effusion or pneumothorax. Healing right posterior rib fractures, 5 through 8. IMPRESSION: 1. Healing right posterior rib fractures, 5 through 8. 2. No acute cardiopulmonary findings. Electronically Signed   By: Marijo Sanes M.D.   On: 02/24/2022 11:00   US PELVIS (TRANSABDOMINAL ONLY)  Result Date: 02/23/2022 CLINICAL DATA:  Endometrial thickening seen on CT EXAM: TRANSABDOMINAL ULTRASOUND OF PELVIS TECHNIQUE: Transabdominal ultrasound examination of the pelvis was performed including evaluation of the uterus, ovaries, adnexal regions, and pelvic cul-de-sac. COMPARISON:  CT 02/22/2022, ultrasound 12/19/2021 FINDINGS: Uterus Measurements: 9.0 x 3.6 x 5.3 cm =  volume: 88 mL. 1.4 cm calcification of the posterior uterine body near the fundus, likely reflecting a small calcified uterine fibroid. Endometrium Thickness: 11 mm. Heterogeneous appearance of the endometrial cavity in the lower uterine segment with trace fluid. Right ovary Not visualized. Left ovary Not visualized. Other findings: Layering debris was noted within the urinary bladder lumen. IMPRESSION: 1. Abnormally thickened endometrium measuring up to 11 mm. Trace fluid within the endometrial canal at the lower uterine segment. Endometrial sampling is indicated to exclude carcinoma. 2. Nonvisualization of the bilateral ovaries. 3. Layering debris noted within the urinary bladder, which could be secondary to stasis possibly blood products. Correlate with urinalysis. Electronically Signed   By: Davina Poke D.O.   On: 02/23/2022 17:28   CT ABDOMEN PELVIS WO CONTRAST  Result Date: 02/22/2022 CLINICAL DATA:  Patient fell out of bed.  Found down.  Bedsores. EXAM: CT ABDOMEN AND PELVIS WITHOUT CONTRAST TECHNIQUE: Multidetector CT imaging of the abdomen and pelvis was performed following the standard protocol without IV contrast. RADIATION DOSE REDUCTION: This exam was performed according to the departmental dose-optimization program which includes automated exposure control, adjustment of the mA and/or kV according to patient size and/or use of iterative reconstruction technique. COMPARISON:  01/01/2022 FINDINGS: Lower chest: Nonacute posterolateral right rib fractures evident. Hepatobiliary: Nodular liver contour is compatible with cirrhosis. No suspicious focal abnormality in the liver on this study without intravenous contrast. There is no evidence for gallstones, gallbladder wall thickening, or pericholecystic fluid. No intrahepatic or extrahepatic biliary dilation. Pancreas: No focal mass lesion. No dilatation of the main duct. No intraparenchymal cyst. No peripancreatic edema. Spleen: 16 mm low-density  lesion in the medial spleen is stable, likely benign. Adrenals/Urinary Tract: No adrenal nodule or mass. Tiny bilateral renal lesions of varying attenuation are stable, likely a combination of simple and complex cyst. No evidence for hydroureter. Bladder is distended. Stomach/Bowel: Stomach is unremarkable. No gastric wall thickening. No evidence of outlet obstruction. Duodenum is normally positioned as is the ligament of Treitz. No small bowel wall thickening. No small bowel dilatation. The terminal ileum is normal. The appendix is not well visualized, but there is no edema or inflammation in the region of the cecum. No gross colonic mass. No colonic wall thickening. Vascular/Lymphatic: There is mild atherosclerotic calcification of the abdominal aorta without aneurysm. There is no gastrohepatic or hepatoduodenal ligament lymphadenopathy. No retroperitoneal or mesenteric lymphadenopathy. No pelvic sidewall lymphadenopathy. Reproductive: Endometrial canal measures 13 mm thickness in the lower uterine segment, abnormal in a postmenopausal  female. There is no adnexal mass. Other: No intraperitoneal free fluid. Musculoskeletal: No worrisome lytic or sclerotic osseous abnormality. No evidence for lumbar spine fracture Bilateral pars interarticularis defects noted at L5. Sacral decubitus ulcer is evident. IMPRESSION: 1. No acute findings in the abdomen or pelvis. 2. Nodular liver contour compatible with cirrhosis. 3. Endometrial canal measures 13 mm thickness in the lower uterine segment, abnormal in a postmenopausal female. Pelvic ultrasound recommended to further evaluate. 4. 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 No acute bony abnormality identified in the lumbar spine. Please see report for lumbar spine CT performed the same time and dictated separately. 5. Bilateral sacral decubitus ulcers. 6.  Aortic Atherosclerois (ICD10-170.0) Electronically Signed   By: Misty Stanley M.D.   On: 02/22/2022 13:50   CT  L-SPINE NO CHARGE  Result Date: 02/22/2022 CLINICAL DATA:  Fall out of bed. Dialysis patient, patient states was left in the floor for 3 hours. EXAM: CT LUMBAR SPINE WITHOUT CONTRAST TECHNIQUE: Multidetector CT imaging of the lumbar spine was performed without intravenous contrast administration. Multiplanar CT image reconstructions were also generated. RADIATION DOSE REDUCTION: This exam was performed according to the departmental dose-optimization program which includes automated exposure control, adjustment of the mA and/or kV according to patient size and/or use of iterative reconstruction technique. COMPARISON:  None Available. FINDINGS: Segmentation: 5 lumbar type vertebrae. Alignment: Straightening of the lumbar spine. Grade 1 anterolisthesis of L5 with bilateral spondylolysis. Vertebrae: No acute fracture or focal pathologic process. Paraspinal and other soft tissues: Marked subcutaneous soft tissue edema and skin irregularity about the lower lumbar/pelvic area consistent with history of known decubitus ulcers. No appreciable fluid collection. Disc levels: T12-L1: No significant finding. L1-L2: No significant finding. L2-L3: Mild disc bulge without significant spinal canal or neural foraminal stenosis. L3-L4: Asymmetric disc bulge to the right with mild left lateral recess stenosis. No significant neural foraminal or spinal canal stenosis. L4-L5: Moderate bilateral lateral recess stenosis and mild spinal canal stenosis. Mild bilateral facet joint arthropathy. L5-S1: Moderate bilateral facet joint arthropathy. No significant spinal canal or neural foraminal stenosis. IMPRESSION: 1.  No acute fracture or traumatic subluxation. 2.  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 3. Skin thickening and deep skin wound in the low lumbar/sacral area consistent with patient's known history of decubitus ulcers. Clinical correlation is suggested. Electronically Signed   By: Keane Police D.O.   On:  02/22/2022 13:43   CT HEAD WO CONTRAST (5MM)  Result Date: 02/22/2022 CLINICAL DATA:  Fall with head and neck trauma. EXAM: CT HEAD WITHOUT CONTRAST CT CERVICAL SPINE WITHOUT CONTRAST TECHNIQUE: Multidetector CT imaging of the head and cervical spine was performed following the standard protocol without intravenous contrast. Multiplanar CT image reconstructions of the cervical spine were also generated. RADIATION DOSE REDUCTION: This exam was performed according to the departmental dose-optimization program which includes automated exposure control, adjustment of the mA and/or kV according to patient size and/or use of iterative reconstruction technique. COMPARISON:  12/18/2021 FINDINGS: CT HEAD FINDINGS Brain: Ventricles, cisterns and other CSF spaces are normal. There is mild chronic ischemic microvascular disease. There is no mass, mass effect, shift of midline structures or acute hemorrhage. No evidence of acute infarction. Vascular: No hyperdense vessel or unexpected calcification. Skull: Normal. Negative for fracture or focal lesion. Sinuses/Orbits: No acute finding. Other: None. CT CERVICAL SPINE FINDINGS Alignment: Normal. Skull base and vertebrae: Vertebral body heights are maintained. There is mild spondylosis throughout the cervical spine to include facet arthropathy and uncovertebral joint  spurring. Bilateral neural foraminal narrowing at the C4-5 level and C5-6 levels. No acute fracture. Atlantoaxial articulation is unremarkable. Soft tissues and spinal canal: Prevertebral soft tissues are normal. No canal stenosis. Disc levels:  Disc space narrowing at the C4-5 and C5-6 levels. Upper chest: No acute findings. Other: None. IMPRESSION: 1. No acute brain injury. 2. Mild chronic ischemic microvascular disease. 3. No acute cervical spine injury. 4. Mild spondylosis of the cervical spine with disc disease at the C4-5 and C5-6 levels. Bilateral neural foraminal narrowing at the C4-5 and C5-6 levels.  Electronically Signed   By: Marin Olp M.D.   On: 02/22/2022 13:38   CT Cervical Spine Wo Contrast  Result Date: 02/22/2022 CLINICAL DATA:  Fall with head and neck trauma. EXAM: CT HEAD WITHOUT CONTRAST CT CERVICAL SPINE WITHOUT CONTRAST TECHNIQUE: Multidetector CT imaging of the head and cervical spine was performed following the standard protocol without intravenous contrast. Multiplanar CT image reconstructions of the cervical spine were also generated. RADIATION DOSE REDUCTION: This exam was performed according to the departmental dose-optimization program which includes automated exposure control, adjustment of the mA and/or kV according to patient size and/or use of iterative reconstruction technique. COMPARISON:  12/18/2021 FINDINGS: CT HEAD FINDINGS Brain: Ventricles, cisterns and other CSF spaces are normal. There is mild chronic ischemic microvascular disease. There is no mass, mass effect, shift of midline structures or acute hemorrhage. No evidence of acute infarction. Vascular: No hyperdense vessel or unexpected calcification. Skull: Normal. Negative for fracture or focal lesion. Sinuses/Orbits: No acute finding. Other: None. CT CERVICAL SPINE FINDINGS Alignment: Normal. Skull base and vertebrae: Vertebral body heights are maintained. There is mild spondylosis throughout the cervical spine to include facet arthropathy and uncovertebral joint spurring. Bilateral neural foraminal narrowing at the C4-5 level and C5-6 levels. No acute fracture. Atlantoaxial articulation is unremarkable. Soft tissues and spinal canal: Prevertebral soft tissues are normal. No canal stenosis. Disc levels:  Disc space narrowing at the C4-5 and C5-6 levels. Upper chest: No acute findings. Other: None. IMPRESSION: 1. No acute brain injury. 2. Mild chronic ischemic microvascular disease. 3. No acute cervical spine injury. 4. Mild spondylosis of the cervical spine with disc disease at the C4-5 and C5-6 levels. Bilateral  neural foraminal narrowing at the C4-5 and C5-6 levels. Electronically Signed   By: Marin Olp M.D.   On: 02/22/2022 13:38   DG Chest Portable 1 View  Result Date: 02/22/2022 CLINICAL DATA:  Fall out of bed this morning. EXAM: PORTABLE CHEST 1 VIEW COMPARISON:  02/07/2022 FINDINGS: Right IJ venous catheter unchanged. Lungs are adequately inflated with mild hazy prominence of the right perihilar vessels likely mild asymmetric vascular congestion. No airspace consolidation or effusion. No pneumothorax. Cardiomediastinal silhouette and remainder of the exam is unchanged. No acute fracture visualized. IMPRESSION: Suggestion of mild asymmetric vascular congestion. Electronically Signed   By: Marin Olp M.D.   On: 02/22/2022 10:38   DG Chest Port 1 View  Result Date: 02/07/2022 CLINICAL DATA:  Hyponatremia EXAM: PORTABLE CHEST 1 VIEW COMPARISON:  01/11/2021 FINDINGS: The heart size and mediastinal contours are within normal limits. Large-bore right neck vascular catheter. No acute airspace opacity. Unchanged elevation of the left hemidiaphragm with associated scarring or atelectasis. The visualized skeletal structures are unremarkable. IMPRESSION: No acute abnormality of the lungs. Unchanged elevation of the left hemidiaphragm with associated scarring or atelectasis. Electronically Signed   By: Delanna Ahmadi M.D.   On: 02/07/2022 12:19      Subjective: Seen and examined  on the day of discharge.  Tearful and somewhat hesitant to leave the hospital however medically stable.  Explained that we could no longer justify inpatient admission.  Patient will discharge to skilled nursing facility.  Discharge Exam: Vitals:   03/02/22 1213 03/02/22 1216  BP:  125/75  Pulse:  98  Resp:  18  Temp: 98.4 F (36.9 C)   SpO2: 100% 100%   Vitals:   03/02/22 1200 03/02/22 1213 03/02/22 1216 03/02/22 1217  BP: 138/68  125/75   Pulse:   98   Resp:   18   Temp:  98.4 F (36.9 C)    TempSrc:  Oral    SpO2:  99% 100% 100%   Weight:    100.3 kg  Height:         The results of significant diagnostics from this hospitalization (including imaging, microbiology, ancillary and laboratory) are listed below for reference.     Microbiology: Recent Results (from the past 240 hour(s))  Blood culture (routine x 2)     Status: None   Collection Time: 02/22/22 10:29 AM   Specimen: BLOOD  Result Value Ref Range Status   Specimen Description BLOOD RIGHT ANTECUBITAL  Final   Special Requests   Final    BOTTLES DRAWN AEROBIC AND ANAEROBIC Blood Culture results may not be optimal due to an inadequate volume of blood received in culture bottles   Culture   Final    NO GROWTH 5 DAYS Performed at [facility], [address] [place], [place] [address]    Report Status 02/27/2022 FINAL  Final  Blood culture (routine x 2)     Status: None   Collection Time: 02/22/22 10:29 AM   Specimen: BLOOD  Result Value Ref Range Status   Specimen Description BLOOD Blood Culture adequate volume  Final   Special Requests   Final    BOTTLES DRAWN AEROBIC AND ANAEROBIC LEFT ANTECUBITAL   Culture   Final    NO GROWTH 5 DAYS Performed at [facility], [address] [place], [place] [address]    Report Status 02/27/2022 FINAL  Final  Gastrointestinal Panel by PCR , Stool     Status: None   Collection Time: 02/25/22  5:50 PM   Specimen: Stool  Result Value Ref Range Status   Campylobacter species NOT DETECTED NOT DETECTED Final   Plesimonas shigelloides NOT DETECTED NOT DETECTED Final   Salmonella species NOT DETECTED NOT DETECTED Final   Yersinia enterocolitica NOT DETECTED NOT DETECTED Final   Vibrio species NOT DETECTED NOT DETECTED Final   Vibrio cholerae NOT DETECTED NOT DETECTED Final   Enteroaggregative E coli (EAEC) NOT DETECTED NOT DETECTED Final   Enteropathogenic E coli (EPEC) NOT DETECTED NOT DETECTED Final   Enterotoxigenic E coli (ETEC) NOT DETECTED NOT DETECTED Final   Shiga like  toxin producing E coli (STEC) NOT DETECTED NOT DETECTED Final   Shigella/Enteroinvasive E coli (EIEC) NOT DETECTED NOT DETECTED Final   Cryptosporidium NOT DETECTED NOT DETECTED Final   Cyclospora cayetanensis NOT DETECTED NOT DETECTED Final   Entamoeba histolytica NOT DETECTED NOT DETECTED Final   Giardia lamblia NOT DETECTED NOT DETECTED Final   Adenovirus F40/41 NOT DETECTED NOT DETECTED Final   Astrovirus NOT DETECTED NOT DETECTED Final   Norovirus GI/GII NOT DETECTED NOT DETECTED Final   Rotavirus A NOT DETECTED NOT DETECTED Final   Sapovirus (I, II, IV, and V) NOT DETECTED NOT DETECTED Final    Comment: Performed at [facility], [place], [place] [address]     Labs: BNP (last 3 results) No results for input(s): "BNP" in the last 8760 hours. Basic Metabolic Panel: Recent Labs  Lab 02/24/22 0414 02/25/22 0606 02/26/22 1104 02/28/22 0747 03/01/22 0934 03/02/22 0830  NA 135 139 135 133* 137 137  K 4.3 3.5 3.9 3.6 3.9 4.2  CL 101 100 98 97* 97* 96*  CO2 22 21* 18* 18* 23 21*  GLUCOSE 302* 96 166* 97 174* 143*  BUN 40* 34* 46* 35* 19 25*  CREATININE 4.64* 3.55* 4.40* 4.14* 3.09* 3.75*  CALCIUM 8.8* 8.8* 8.7* 8.7* 8.6* 9.0  MG  --   --   --   --  1.7  --   PHOS 3.7 2.8  --  4.0  --   --    Liver Function Tests: Recent Labs  Lab 02/24/22 0414 02/25/22 0606 02/28/22 0747  ALBUMIN 1.9* 2.1* 1.8*   No results for input(s): "LIPASE", "AMYLASE" in the last 168 hours. No results for input(s): "AMMONIA" in the last 168 hours. CBC: Recent Labs  Lab 02/24/22 0414 02/25/22 0606 02/26/22 1104 02/28/22 0641 03/01/22 0934 03/02/22 0830  WBC 14.4* 17.2* 9.8 14.8* 10.6* 11.5*  NEUTROABS 10.9* 13.8* 7.6  --  8.1* 8.5*  HGB 6.6* 8.7* 8.7* 8.0* 7.4* 7.7*  HCT 21.1* 27.8* 28.3* 24.7* 22.6* 24.2*  MCV 92.5 89.4 91.3 87.6 89.0 89.0  PLT 333 305 318 272 207 256   Cardiac Enzymes: No results for input(s): "CKTOTAL", "CKMB", "CKMBINDEX", "TROPONINI" in  the last 168 hours. BNP: Invalid input(s): "POCBNP" CBG: Recent Labs  Lab 03/01/22 0738 03/01/22 1204 03/01/22 1733 03/01/22 2043 03/02/22 0758  GLUCAP 185* 158* 127* 175* 152*   D-Dimer No results for input(s): "DDIMER" in the last 72 hours. Hgb A1c No results for input(s): "HGBA1C" in the last 72 hours. Lipid Profile No results for input(s): "CHOL", "HDL", "LDLCALC", "TRIG", "CHOLHDL", "LDLDIRECT" in the last 72 hours. Thyroid function studies No results for input(s): "TSH", "T4TOTAL", "T3FREE", "THYROIDAB" in the last 72 hours.  Invalid input(s): "FREET3" Anemia work up No results for input(s): "VITAMINB12", "FOLATE", "FERRITIN", "TIBC", "IRON", "RETICCTPCT" in the last 72 hours. Urinalysis    Component Value Date/Time   COLORURINE YELLOW (A) 01/02/2022 0038   APPEARANCEUR CLOUDY (A) 01/02/2022 0038   LABSPEC 1.013 01/02/2022 0038   PHURINE 5.0 01/02/2022 0038   GLUCOSEU NEGATIVE 01/02/2022 0038   HGBUR SMALL (A) 01/02/2022 0038   BILIRUBINUR NEGATIVE 01/02/2022 0038   KETONESUR NEGATIVE 01/02/2022 0038   PROTEINUR 100 (A) 01/02/2022 0038   NITRITE NEGATIVE 01/02/2022 0038   LEUKOCYTESUR NEGATIVE 01/02/2022 0038   Sepsis Labs Recent Labs  Lab 02/26/22 1104 02/28/22 0641 03/01/22 0934 03/02/22 0830  WBC 9.8 14.8* 10.6* 11.5*   Microbiology Recent Results (from the past 240 hour(s))  Blood culture (routine x 2)     Status: None   Collection Time: 02/22/22 10:29 AM   Specimen: BLOOD  Result Value Ref Range Status   Specimen Description BLOOD RIGHT ANTECUBITAL  Final   Special Requests   Final    BOTTLES DRAWN AEROBIC AND ANAEROBIC Blood Culture results may not be optimal due to an inadequate volume of blood received in culture bottles   Culture   Final    NO GROWTH 5 DAYS Performed at [facility], [address], [place], [place] [address]    Report Status 02/27/2022 FINAL  Final  Blood culture (routine x 2)  Status: None   Collection  Time: 02/22/22 10:29 AM   Specimen: BLOOD  Result Value Ref Range Status   Specimen Description BLOOD Blood Culture adequate volume  Final   Special Requests   Final    BOTTLES DRAWN AEROBIC AND ANAEROBIC LEFT ANTECUBITAL   Culture   Final    NO GROWTH 5 DAYS Performed at [facility], [address] [place], [place] [address]    Report Status 02/27/2022 FINAL  Final  Gastrointestinal Panel by PCR , Stool     Status: None   Collection Time: 02/25/22  5:50 PM   Specimen: Stool  Result Value Ref Range Status   Campylobacter species NOT DETECTED NOT DETECTED Final   Plesimonas shigelloides NOT DETECTED NOT DETECTED Final   Salmonella species NOT DETECTED NOT DETECTED Final   Yersinia enterocolitica NOT DETECTED NOT DETECTED Final   Vibrio species NOT DETECTED NOT DETECTED Final   Vibrio cholerae NOT DETECTED NOT DETECTED Final   Enteroaggregative E coli (EAEC) NOT DETECTED NOT DETECTED Final   Enteropathogenic E coli (EPEC) NOT DETECTED NOT DETECTED Final   Enterotoxigenic E coli (ETEC) NOT DETECTED NOT DETECTED Final   Shiga like toxin producing E coli (STEC) NOT DETECTED NOT DETECTED Final   Shigella/Enteroinvasive E coli (EIEC) NOT DETECTED NOT DETECTED Final   Cryptosporidium NOT DETECTED NOT DETECTED Final   Cyclospora cayetanensis NOT DETECTED NOT DETECTED Final   Entamoeba histolytica NOT DETECTED NOT DETECTED Final   Giardia lamblia NOT DETECTED NOT DETECTED Final   Adenovirus F40/41 NOT DETECTED NOT DETECTED Final   Astrovirus NOT DETECTED NOT DETECTED Final   Norovirus GI/GII NOT DETECTED NOT DETECTED Final   Rotavirus A NOT DETECTED NOT DETECTED Final   Sapovirus (I, II, IV, and V) NOT DETECTED NOT DETECTED Final    Comment: Performed at [facility], [address], [place], [place] [address]     Time coordinating discharge: Over 30 minutes  SIGNED:   Sidney Ace, MD  [facility] 03/02/2022, 1:04 PM Pager   If 7PM-7AM, please  contact night-coverage

## 2022-03-02 NOTE — Progress Notes (Signed)
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  Patient presents to the emergency department after suffering a mechanical fall at nursing facility.  Patient has been admitted for ESRD (end stage renal disease) ([place]) [N18.6] Wound of buttock, unspecified laterality, initial encounter [S31.809A] Sepsis ([place]) [A41.9] Sepsis, due to unspecified organism, unspecified whether acute organ dysfunction present [facility]) [A41.9]  Patient is known to our practice from previous admissions and receives outpatient dialysis treatment at [facility] on a TTS schedule,   Patient seen and evaluated during dialysis   HEMODIALYSIS FLOWSHEET:  Blood Flow Rate (mL/min): 400 mL/min Arterial Pressure (mmHg): -140 mmHg Venous Pressure (mmHg): 140 mmHg TMP (mmHg): 4 mmHg Ultrafiltration Rate (mL/min): 767 mL/min Dialysate Flow Rate (mL/min): 300 ml/min Dialysis Fluid Bolus: Normal Saline Bolus Amount (mL): 300 mL  No complaints at this time Appears to be resting comfortably   Objective:  Vital signs in last 24 hours:  Temp:  [97.6 F (36.4 C)-99.5 F (37.5 C)] 98 F (36.7 C) (07/31 0826) Pulse Rate:  [72-91] 82 (07/31 1100) Resp:  [9-18] 12 (07/31 1100) BP: (115-147)/(59-103) 115/103 (07/31 1100) SpO2:  [97 %-100 %] 100 % (07/31 1100) Weight:  [103.3 kg] 103.3 kg (07/31 0826)  Weight change:  Filed Weights   02/28/22 1225 03/02/22 0821 03/02/22 0826  Weight: 101.7 kg 103.3 kg 103.3 kg    Intake/Output: I/O last 3 completed shifts: In: 100 [P.O.:100] Out: -    Intake/Output this shift:  No intake/output data recorded.  Physical Exam: General: NAD  Head: Normocephalic, atraumatic. Moist oral mucosal membranes  Eyes: Anicteric  Lungs:  Clear to  auscultation, normal effort, room air  Heart: Regular rate and rhythm  Abdomen:  Soft, nontender, obese  Extremities: 2+ peripheral edema.  Neurologic: Nonfocal, moving all four extremities  Skin: Warm, dry.  Significant sacral decubitus ulcers.  Access: Right chest PermCath    Basic Metabolic Panel: Recent Labs  Lab 02/24/22 0414 02/25/22 0606 02/26/22 1104 02/28/22 0747 03/01/22 0934 03/02/22 0830  NA 135 139 135 133* 137 137  K 4.3 3.5 3.9 3.6 3.9 4.2  CL 101 100 98 97* 97* 96*  CO2 22 21* 18* 18* 23 21*  GLUCOSE 302* 96 166* 97 174* 143*  BUN 40* 34* 46* 35* 19 25*  CREATININE 4.64* 3.55* 4.40* 4.14* 3.09* 3.75*  CALCIUM 8.8* 8.8* 8.7* 8.7* 8.6* 9.0  MG  --   --   --   --  1.7  --   PHOS 3.7 2.8  --  4.0  --   --      Liver Function Tests: Recent Labs  Lab 02/24/22 0414 02/25/22 0606 02/28/22 0747  ALBUMIN 1.9* 2.1* 1.8*    No results for input(s): "LIPASE", "AMYLASE" in the last 168 hours. No results for input(s): "AMMONIA" in the last 168 hours.  CBC: Recent Labs  Lab 02/24/22 0414 02/25/22 0606 02/26/22 1104 02/28/22 0641 03/01/22 0934 03/02/22 0830  WBC 14.4* 17.2* 9.8 14.8* 10.6* 11.5*  NEUTROABS 10.9* 13.8* 7.6  --  8.1* 8.5*  HGB 6.6* 8.7* 8.7* 8.0* 7.4* 7.7*  HCT 21.1* 27.8* 28.3* 24.7* 22.6* 24.2*  MCV 92.5 89.4 91.3 87.6 89.0 89.0  PLT 333 305 318 272 207 256     Cardiac Enzymes: No results for input(s): "CKTOTAL", "CKMB", "CKMBINDEX", "TROPONINI" in the  last 168 hours.   BNP: Invalid input(s): "POCBNP"  CBG: Recent Labs  Lab 03/01/22 0738 03/01/22 1204 03/01/22 1733 03/01/22 2043 03/02/22 0758  GLUCAP 185* 158* 127* 175* 152*     Microbiology: Results for orders placed or performed during the hospital encounter of 02/22/22  Blood culture (routine x 2)     Status: None   Collection Time: 02/22/22 10:29 AM   Specimen: BLOOD  Result Value Ref Range Status   Specimen Description BLOOD RIGHT ANTECUBITAL  Final   Special  Requests   Final    BOTTLES DRAWN AEROBIC AND ANAEROBIC Blood Culture results may not be optimal due to an inadequate volume of blood received in culture bottles   Culture   Final    NO GROWTH 5 DAYS Performed at [facility], [address] [place], [place] [address]    Report Status 02/27/2022 FINAL  Final  Blood culture (routine x 2)     Status: None   Collection Time: 02/22/22 10:29 AM   Specimen: BLOOD  Result Value Ref Range Status   Specimen Description BLOOD Blood Culture adequate volume  Final   Special Requests   Final    BOTTLES DRAWN AEROBIC AND ANAEROBIC LEFT ANTECUBITAL   Culture   Final    NO GROWTH 5 DAYS Performed at [facility], [address] [place], [place] [address]    Report Status 02/27/2022 FINAL  Final  Gastrointestinal Panel by PCR , Stool     Status: None   Collection Time: 02/25/22  5:50 PM   Specimen: Stool  Result Value Ref Range Status   Campylobacter species NOT DETECTED NOT DETECTED Final   Plesimonas shigelloides NOT DETECTED NOT DETECTED Final   Salmonella species NOT DETECTED NOT DETECTED Final   Yersinia enterocolitica NOT DETECTED NOT DETECTED Final   Vibrio species NOT DETECTED NOT DETECTED Final   Vibrio cholerae NOT DETECTED NOT DETECTED Final   Enteroaggregative E coli (EAEC) NOT DETECTED NOT DETECTED Final   Enteropathogenic E coli (EPEC) NOT DETECTED NOT DETECTED Final   Enterotoxigenic E coli (ETEC) NOT DETECTED NOT DETECTED Final   Shiga like toxin producing E coli (STEC) NOT DETECTED NOT DETECTED Final   Shigella/Enteroinvasive E coli (EIEC) NOT DETECTED NOT DETECTED Final   Cryptosporidium NOT DETECTED NOT DETECTED Final   Cyclospora cayetanensis NOT DETECTED NOT DETECTED Final   Entamoeba histolytica NOT DETECTED NOT DETECTED Final   Giardia lamblia NOT DETECTED NOT DETECTED Final   Adenovirus F40/41 NOT DETECTED NOT DETECTED Final   Astrovirus NOT DETECTED NOT DETECTED Final   Norovirus GI/GII NOT  DETECTED NOT DETECTED Final   Rotavirus A NOT DETECTED NOT DETECTED Final   Sapovirus (I, II, IV, and V) NOT DETECTED NOT DETECTED Final    Comment: Performed at [facility], [address] [place], [place] [address]    Coagulation Studies: No results for input(s): "LABPROT", "INR" in the last 72 hours.  Urinalysis: No results for input(s): "COLORURINE", "LABSPEC", "PHURINE", "GLUCOSEU", "HGBUR", "BILIRUBINUR", "KETONESUR", "PROTEINUR", "UROBILINOGEN", "NITRITE", "LEUKOCYTESUR" in the last 72 hours.  Invalid input(s): "APPERANCEUR"    Imaging: No results found.   Medications:     ceFAZolin (ANCEF) IV 1 g (03/01/22 1328)   metronidazole 500 mg (03/01/22 2150)   sodium thiosulfate 25 g in sodium chloride 0.9 % 200 mL Infusion for Calciphylaxis 25 g (02/28/22 1113)    (feeding supplement) PROSource Plus  30 mL Oral TID BM   allopurinol  100 mg Oral Daily  alum & mag hydroxide-simeth  30 mL Oral Q6H   ascorbic acid  500 mg Oral BID   atorvastatin  40 mg Oral Daily   Chlorhexidine Gluconate Cloth  6 each Topical Daily   cholestyramine light  4 g Oral BID   epoetin (EPOGEN/PROCRIT) injection  4,000 Units Intravenous Q T,Th,Sa-HD   fentaNYL  1 patch Transdermal Q72H   fluticasone  2 spray Each Nare Daily   gabapentin  200 mg Oral TID   heparin injection (subcutaneous)  5,000 Units Subcutaneous Q8H   insulin aspart  0-6 Units Subcutaneous TID WC   insulin aspart  3 Units Subcutaneous TID WC   insulin glargine-yfgn  15 Units Subcutaneous Daily   loratadine  10 mg Oral Daily   metoprolol tartrate  25 mg Oral BID   multivitamin  1 tablet Oral QHS   nutrition supplement (JUVEN)  1 packet Oral BID BM   omega-3 acid ethyl esters  1 g Oral Daily   pantoprazole  40 mg Oral Q0600   Ensure Max Protein  11 oz Oral BID   psyllium  1 packet Oral Daily   simethicone  80 mg Oral QID   acetaminophen, midodrine, ondansetron, oxyCODONE, polyethylene glycol  Assessment/ Plan:   [name] is a 66 y.o.  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  Patient presents to the emergency department after suffering a mechanical fall at nursing facility.  Patient has been admitted for ESRD (end stage renal disease) ([place]) [N18.6] Wound of buttock, unspecified laterality, initial encounter [S31.809A] Sepsis ([place]) [A41.9] Sepsis, due to unspecified organism, unspecified whether acute organ dysfunction present ([place]) [A41.9]  [facility]/TTS/Rt Permcath  End-stage renal disease on hemodialysis.  Will maintain outpatient schedule if possible.    Patient receiving dialysis today, 2 L fluid removal as tolerated.  Patient cleared to discharge from renal stance.  Could benefit from regularly scheduled treatment tomorrow for additional fluid removal.  2. Anemia of chronic kidney disease Lab Results  Component Value Date   HGB 7.7 (L) 03/02/2022   Patient receives Mircera biweekly outpatient.   Currently receiving low-dose EPO with dialysis treatments.  3. Secondary Hyperparathyroidism:  Lab Results  Component Value Date   PTH 23 01/15/2022   CALCIUM 9.0 03/02/2022   CAION 1.11 (L) 01/01/2022   PHOS 4.0 02/28/2022   .  Calcium and phosphorus levels within acceptable range.  4.  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  Continue sodium thiosulfate with treatments.  5. Diabetes mellitus type II with chronic kidney disease: insulin dependent. Most recent hemoglobin A1c is 10.0 on 12/18/21.     LOS: 8   7/31/202311:14 AM

## 2022-03-02 NOTE — Progress Notes (Signed)
Pre RN HD assessment

## 2022-03-02 NOTE — TOC Transition Note (Signed)
Transition of Care [facility]) - CM/SW Discharge Note   Patient Details  Name: Maddy Graham MRN: [id number] Date of Birth: August 11, 1955  [facility] CM/SW Contact:  Eileen Stanford, LCSW Phone Number: 03/02/2022, 1:23 PM   Clinical Narrative:  Clinical Social Worker facilitated patient discharge including contacting patient family and facility to confirm patient discharge plans.  Clinical information faxed to facility and family agreeable with plan.  CSW arranged ambulance transport via [facility] to [facility] room 610A .  RN to call [phone] for report prior to discharge.    Final next level of care: Skilled Nursing Facility Barriers to Discharge: No Barriers Identified   Patient Goals and CMS Choice Patient states their goals for this hospitalization and ongoing recovery are:: pt to go to different snf   Choice offered to / list presented to : [facility] POA / [place]  Discharge Placement              Patient chooses bed at:  ([facility]) Patient to be transferred to facility by: [facility] Name of family member notified: [name] Patient and family notified of of transfer: 03/02/22  Discharge Plan and Services In-house Referral: Clinical Social Work   Post Acute Care Choice: [place]                               Social Determinants of Health (SDOH) Interventions     Readmission Risk Interventions     No data to display

## 2022-03-09 ENCOUNTER — Ambulatory Visit: Payer: Medicare Other | Admitting: Obstetrics & Gynecology

## 2022-03-13 ENCOUNTER — Emergency Department: Payer: Medicare Other

## 2022-03-13 ENCOUNTER — Inpatient Hospital Stay
Admission: EM | Admit: 2022-03-13 | Discharge: 2022-03-21 | DRG: 091 | Disposition: A | Discharge status: Skilled Nursing Facility | Payer: Medicare Other | Source: Skilled Nursing Facility | Attending: Internal Medicine | Admitting: Internal Medicine

## 2022-03-13 DIAGNOSIS — Z801 Family history of malignant neoplasm of trachea, bronchus and lung: Secondary | ICD-10-CM

## 2022-03-13 DIAGNOSIS — G894 Chronic pain syndrome: Secondary | ICD-10-CM | POA: Diagnosis present

## 2022-03-13 DIAGNOSIS — L89890 Pressure ulcer of other site, unstageable: Secondary | ICD-10-CM | POA: Diagnosis present

## 2022-03-13 DIAGNOSIS — Z881 Allergy status to other antibiotic agents status: Secondary | ICD-10-CM

## 2022-03-13 DIAGNOSIS — Z8249 Family history of ischemic heart disease and other diseases of the circulatory system: Secondary | ICD-10-CM

## 2022-03-13 DIAGNOSIS — T7411XA Adult physical abuse, confirmed, initial encounter: Secondary | ICD-10-CM | POA: Diagnosis present

## 2022-03-13 DIAGNOSIS — T40415A Adverse effect of fentanyl or fentanyl analogs, initial encounter: Secondary | ICD-10-CM | POA: Diagnosis present

## 2022-03-13 DIAGNOSIS — E8779 Other fluid overload: Secondary | ICD-10-CM | POA: Diagnosis not present

## 2022-03-13 DIAGNOSIS — T402X5A Adverse effect of other opioids, initial encounter: Secondary | ICD-10-CM | POA: Diagnosis present

## 2022-03-13 DIAGNOSIS — L89150 Pressure ulcer of sacral region, unstageable: Secondary | ICD-10-CM | POA: Diagnosis present

## 2022-03-13 DIAGNOSIS — Z992 Dependence on renal dialysis: Secondary | ICD-10-CM

## 2022-03-13 DIAGNOSIS — Z91158 Patient's noncompliance with renal dialysis for other reason: Secondary | ICD-10-CM

## 2022-03-13 DIAGNOSIS — Z794 Long term (current) use of insulin: Secondary | ICD-10-CM

## 2022-03-13 DIAGNOSIS — E1151 Type 2 diabetes mellitus with diabetic peripheral angiopathy without gangrene: Secondary | ICD-10-CM | POA: Diagnosis present

## 2022-03-13 DIAGNOSIS — R4189 Other symptoms and signs involving cognitive functions and awareness: Secondary | ICD-10-CM | POA: Diagnosis present

## 2022-03-13 DIAGNOSIS — Z79899 Other long term (current) drug therapy: Secondary | ICD-10-CM

## 2022-03-13 DIAGNOSIS — D631 Anemia in chronic kidney disease: Secondary | ICD-10-CM | POA: Diagnosis present

## 2022-03-13 DIAGNOSIS — G928 Other toxic encephalopathy: Secondary | ICD-10-CM | POA: Diagnosis not present

## 2022-03-13 DIAGNOSIS — Z79891 Long term (current) use of opiate analgesic: Secondary | ICD-10-CM

## 2022-03-13 DIAGNOSIS — D72829 Elevated white blood cell count, unspecified: Secondary | ICD-10-CM | POA: Diagnosis present

## 2022-03-13 DIAGNOSIS — Z7401 Bed confinement status: Secondary | ICD-10-CM

## 2022-03-13 DIAGNOSIS — Z880 Allergy status to penicillin: Secondary | ICD-10-CM

## 2022-03-13 DIAGNOSIS — G4733 Obstructive sleep apnea (adult) (pediatric): Secondary | ICD-10-CM | POA: Diagnosis present

## 2022-03-13 DIAGNOSIS — R410 Disorientation, unspecified: Secondary | ICD-10-CM

## 2022-03-13 DIAGNOSIS — Z515 Encounter for palliative care: Secondary | ICD-10-CM

## 2022-03-13 DIAGNOSIS — Z6841 Body Mass Index (BMI) 40.0 and over, adult: Secondary | ICD-10-CM

## 2022-03-13 DIAGNOSIS — F600 Paranoid personality disorder: Secondary | ICD-10-CM | POA: Diagnosis present

## 2022-03-13 DIAGNOSIS — E785 Hyperlipidemia, unspecified: Secondary | ICD-10-CM | POA: Diagnosis present

## 2022-03-13 DIAGNOSIS — J9601 Acute respiratory failure with hypoxia: Secondary | ICD-10-CM

## 2022-03-13 DIAGNOSIS — E876 Hypokalemia: Secondary | ICD-10-CM | POA: Diagnosis not present

## 2022-03-13 DIAGNOSIS — Z66 Do not resuscitate: Secondary | ICD-10-CM | POA: Diagnosis present

## 2022-03-13 DIAGNOSIS — Z888 Allergy status to other drugs, medicaments and biological substances status: Secondary | ICD-10-CM

## 2022-03-13 DIAGNOSIS — S31809A Unspecified open wound of unspecified buttock, initial encounter: Secondary | ICD-10-CM | POA: Diagnosis present

## 2022-03-13 DIAGNOSIS — B957 Other staphylococcus as the cause of diseases classified elsewhere: Secondary | ICD-10-CM | POA: Diagnosis present

## 2022-03-13 DIAGNOSIS — F22 Delusional disorders: Secondary | ICD-10-CM | POA: Diagnosis present

## 2022-03-13 DIAGNOSIS — R7881 Bacteremia: Secondary | ICD-10-CM | POA: Diagnosis present

## 2022-03-13 DIAGNOSIS — Z9181 History of falling: Secondary | ICD-10-CM

## 2022-03-13 DIAGNOSIS — Z833 Family history of diabetes mellitus: Secondary | ICD-10-CM

## 2022-03-13 DIAGNOSIS — L89310 Pressure ulcer of right buttock, unstageable: Secondary | ICD-10-CM | POA: Diagnosis present

## 2022-03-13 DIAGNOSIS — I120 Hypertensive chronic kidney disease with stage 5 chronic kidney disease or end stage renal disease: Secondary | ICD-10-CM | POA: Diagnosis present

## 2022-03-13 DIAGNOSIS — L89320 Pressure ulcer of left buttock, unstageable: Secondary | ICD-10-CM | POA: Diagnosis present

## 2022-03-13 DIAGNOSIS — R4182 Altered mental status, unspecified: Secondary | ICD-10-CM | POA: Diagnosis not present

## 2022-03-13 DIAGNOSIS — Z882 Allergy status to sulfonamides status: Secondary | ICD-10-CM

## 2022-03-13 DIAGNOSIS — I10 Essential (primary) hypertension: Secondary | ICD-10-CM | POA: Diagnosis not present

## 2022-03-13 DIAGNOSIS — M542 Cervicalgia: Secondary | ICD-10-CM | POA: Diagnosis not present

## 2022-03-13 DIAGNOSIS — Z820 Family history of epilepsy and other diseases of the nervous system: Secondary | ICD-10-CM

## 2022-03-13 DIAGNOSIS — E11649 Type 2 diabetes mellitus with hypoglycemia without coma: Secondary | ICD-10-CM | POA: Diagnosis not present

## 2022-03-13 DIAGNOSIS — L89159 Pressure ulcer of sacral region, unspecified stage: Secondary | ICD-10-CM | POA: Diagnosis present

## 2022-03-13 DIAGNOSIS — S31809D Unspecified open wound of unspecified buttock, subsequent encounter: Secondary | ICD-10-CM

## 2022-03-13 DIAGNOSIS — E1122 Type 2 diabetes mellitus with diabetic chronic kidney disease: Secondary | ICD-10-CM | POA: Diagnosis present

## 2022-03-13 DIAGNOSIS — Z885 Allergy status to narcotic agent status: Secondary | ICD-10-CM

## 2022-03-13 DIAGNOSIS — D649 Anemia, unspecified: Secondary | ICD-10-CM | POA: Diagnosis present

## 2022-03-13 DIAGNOSIS — N186 End stage renal disease: Secondary | ICD-10-CM | POA: Diagnosis present

## 2022-03-13 LAB — LACTIC ACID, PLASMA: Lactic Acid, Venous: 1 mmol/L (ref 0.5–1.9)

## 2022-03-13 LAB — CBG MONITORING, ED: Glucose-Capillary: 130 mg/dL — ABNORMAL HIGH (ref 70–99)

## 2022-03-13 LAB — CBC WITH DIFFERENTIAL/PLATELET
Abs Immature Granulocytes: 0.14 10*3/uL — ABNORMAL HIGH (ref 0.00–0.07)
Basophils Absolute: 0.1 10*3/uL (ref 0.0–0.1)
Basophils Relative: 0 %
Eosinophils Absolute: 0.4 10*3/uL (ref 0.0–0.5)
Eosinophils Relative: 3 %
HCT: 25.5 % — ABNORMAL LOW (ref 36.0–46.0)
Hemoglobin: 7.7 g/dL — ABNORMAL LOW (ref 12.0–15.0)
Immature Granulocytes: 1 %
Lymphocytes Relative: 8 %
Lymphs Abs: 1.2 10*3/uL (ref 0.7–4.0)
MCH: 29.3 pg (ref 26.0–34.0)
MCHC: 30.2 g/dL (ref 30.0–36.0)
MCV: 97 fL (ref 80.0–100.0)
Monocytes Absolute: 1.2 10*3/uL — ABNORMAL HIGH (ref 0.1–1.0)
Monocytes Relative: 8 %
Neutro Abs: 11.7 10*3/uL — ABNORMAL HIGH (ref 1.7–7.7)
Neutrophils Relative %: 80 %
Platelets: 288 10*3/uL (ref 150–400)
RBC: 2.63 MIL/uL — ABNORMAL LOW (ref 3.87–5.11)
RDW: 19.9 % — ABNORMAL HIGH (ref 11.5–15.5)
WBC: 14.6 10*3/uL — ABNORMAL HIGH (ref 4.0–10.5)
nRBC: 0 % (ref 0.0–0.2)

## 2022-03-13 LAB — COMPREHENSIVE METABOLIC PANEL
ALT: 10 U/L (ref 0–44)
AST: 29 U/L (ref 15–41)
Albumin: 1.7 g/dL — ABNORMAL LOW (ref 3.5–5.0)
Alkaline Phosphatase: 137 U/L — ABNORMAL HIGH (ref 38–126)
Anion gap: 9 (ref 5–15)
BUN: 16 mg/dL (ref 8–23)
CO2: 27 mmol/L (ref 22–32)
Calcium: 8.5 mg/dL — ABNORMAL LOW (ref 8.9–10.3)
Chloride: 103 mmol/L (ref 98–111)
Creatinine, Ser: 2.95 mg/dL — ABNORMAL HIGH (ref 0.44–1.00)
GFR, Estimated: 17 mL/min — ABNORMAL LOW (ref >60)
Glucose, Bld: 152 mg/dL — ABNORMAL HIGH (ref 70–99)
Potassium: 3.5 mmol/L (ref 3.5–5.1)
Sodium: 139 mmol/L (ref 135–145)
Total Bilirubin: 0.7 mg/dL (ref 0.3–1.2)
Total Protein: 5.8 g/dL — ABNORMAL LOW (ref 6.5–8.1)

## 2022-03-13 LAB — BLOOD GAS, VENOUS
Acid-Base Excess: 1.3 mmol/L (ref 0.0–2.0)
Bicarbonate: 27.2 mmol/L (ref 20.0–28.0)
O2 Saturation: 79.1 %
Patient temperature: 37
pCO2, Ven: 47 mmHg (ref 44–60)
pH, Ven: 7.37 (ref 7.25–7.43)
pO2, Ven: 47 mmHg — ABNORMAL HIGH (ref 32–45)

## 2022-03-13 LAB — PROTIME-INR
INR: 1.5 — ABNORMAL HIGH (ref 0.8–1.2)
Prothrombin Time: 18 seconds — ABNORMAL HIGH (ref 11.4–15.2)

## 2022-03-13 LAB — BRAIN NATRIURETIC PEPTIDE: B Natriuretic Peptide: 454.9 pg/mL — ABNORMAL HIGH (ref 0.0–100.0)

## 2022-03-13 MED ORDER — INSULIN ASPART 100 UNIT/ML IJ SOLN
0.0000 [IU] | Freq: Three times a day (TID) | INTRAMUSCULAR | Status: DC
  Administered 2022-03-19 – 2022-03-20 (×2): 1 [IU] via SUBCUTANEOUS
  Filled 2022-03-13 (×2): qty 1

## 2022-03-13 MED ORDER — CHLORHEXIDINE GLUCONATE CLOTH 2 % EX PADS
6.0000 | MEDICATED_PAD | Freq: Every day | CUTANEOUS | Status: DC
  Administered 2022-03-15 – 2022-03-16 (×2): 6 via TOPICAL
  Filled 2022-03-13: qty 6

## 2022-03-13 MED ORDER — OXYCODONE HCL 5 MG PO TABS
5.0000 mg | ORAL_TABLET | Freq: Four times a day (QID) | ORAL | Status: DC | PRN
  Administered 2022-03-14 – 2022-03-21 (×7): 5 mg via ORAL
  Filled 2022-03-13 (×6): qty 1

## 2022-03-13 MED ORDER — SENNOSIDES-DOCUSATE SODIUM 8.6-50 MG PO TABS
1.0000 | ORAL_TABLET | Freq: Every evening | ORAL | Status: DC | PRN
  Administered 2022-03-20: 1 via ORAL
  Filled 2022-03-13: qty 1

## 2022-03-13 MED ORDER — ONDANSETRON HCL 4 MG PO TABS: 4.0000 mg | ORAL_TABLET | Freq: Four times a day (QID) | ORAL | Status: AC | PRN

## 2022-03-13 MED ORDER — HEPARIN SODIUM (PORCINE) 1000 UNIT/ML IJ SOLN
INTRAMUSCULAR | Status: AC
  Filled 2022-03-13: qty 10

## 2022-03-13 MED ORDER — HEPARIN SODIUM (PORCINE) 5000 UNIT/ML IJ SOLN
5000.0000 [IU] | Freq: Three times a day (TID) | INTRAMUSCULAR | Status: DC
  Administered 2022-03-13 – 2022-03-14 (×2): 5000 [IU] via SUBCUTANEOUS
  Filled 2022-03-13 (×2): qty 1

## 2022-03-13 MED ORDER — METOPROLOL TARTRATE 25 MG PO TABS
25.0000 mg | ORAL_TABLET | Freq: Two times a day (BID) | ORAL | Status: DC
  Administered 2022-03-13 – 2022-03-20 (×12): 25 mg via ORAL
  Filled 2022-03-13 (×15): qty 1

## 2022-03-13 MED ORDER — ONDANSETRON HCL 4 MG/2ML IJ SOLN
4.0000 mg | Freq: Four times a day (QID) | INTRAMUSCULAR | Status: AC | PRN
Start: 2022-03-13 — End: 2022-03-18

## 2022-03-13 MED ORDER — INSULIN GLARGINE-YFGN 100 UNIT/ML ~~LOC~~ SOLN
15.0000 [IU] | Freq: Every day | SUBCUTANEOUS | Status: DC
  Administered 2022-03-13 – 2022-03-20 (×7): 15 [IU] via SUBCUTANEOUS
  Filled 2022-03-13 (×9): qty 0.15

## 2022-03-13 MED ORDER — RENA-VITE PO TABS
1.0000 | ORAL_TABLET | Freq: Every day | ORAL | Status: DC
  Administered 2022-03-15 – 2022-03-20 (×5): 1 via ORAL
  Filled 2022-03-13 (×9): qty 1

## 2022-03-13 MED ORDER — INSULIN ASPART 100 UNIT/ML IJ SOLN: 0.0000 [IU] | Freq: Every day | INTRAMUSCULAR | Status: DC

## 2022-03-13 MED ORDER — ACETAMINOPHEN 650 MG RE SUPP: 650.0000 mg | Freq: Four times a day (QID) | RECTAL | Status: AC | PRN

## 2022-03-13 MED ORDER — ACETAMINOPHEN 325 MG PO TABS
650.0000 mg | ORAL_TABLET | Freq: Four times a day (QID) | ORAL | Status: AC | PRN
  Administered 2022-03-16: 650 mg via ORAL
  Filled 2022-03-13: qty 2

## 2022-03-13 MED ORDER — ATORVASTATIN CALCIUM 20 MG PO TABS
40.0000 mg | ORAL_TABLET | Freq: Every day | ORAL | Status: DC
  Administered 2022-03-14 – 2022-03-21 (×6): 40 mg via ORAL
  Filled 2022-03-13 (×8): qty 2

## 2022-03-13 NOTE — Assessment & Plan Note (Signed)
-   Complicated wound, possible calciphylaxis - Patient would benefit from multi disciplinary surgical team available with plastics surgery, reconstructive surgery, and dermatology - Follow-up with the above outpatient

## 2022-03-13 NOTE — Assessment & Plan Note (Signed)
-   CPAP nightly ordered 

## 2022-03-13 NOTE — Assessment & Plan Note (Signed)
On metoprolol 50 mg twice a day 

## 2022-03-13 NOTE — Assessment & Plan Note (Signed)
-   Insulin glargine 15 units nightly resumed - Insulin SSI with at bedtime coverage ordered

## 2022-03-13 NOTE — Progress Notes (Signed)
Hd tx of 3hrs completed. 71.9L total vol processed. Pt refused to complete remaining 73mins. Dr Holley Raring notified. No tx complications noted. Report given to [facility], rn Total uf removed: 1989ml Post hd v/s: 98.1 130/56(78) 100 16 100%

## 2022-03-13 NOTE — ED Provider Triage Note (Signed)
Emergency Medicine Provider Triage Evaluation Note  Theresa Warren , a 66 y.o. female  was evaluated in triage.  Pt complains from [facility] for worsening wounds on her legs and buttocks. Recently discharged after treatment for sepsis. No report of fever. Altered mental status, unsure if acute or at baseline.  Physical Exam  BP (!) 132/52 (BP Location: Right Arm)   Pulse 95   Temp 97.7 F (36.5 C) (Oral)   Resp 18   SpO2 94%  Gen:   Awake, no distress   Resp:  Normal effort  MSK:   Moves extremities without difficulty  Other:    Medical Decision Making  Medically screening exam initiated at 2:37 PM.  Appropriate orders placed.  Zanasia Hickson was informed that the remainder of the evaluation will be completed by another provider, this initial triage assessment does not replace that evaluation, and the importance of remaining in the ED until their evaluation is complete.    Theresa Dike, Theresa Warren 03/13/22 1439

## 2022-03-13 NOTE — ED Provider Notes (Signed)
[facility] Provider Note    Event Date/Time   First MD Initiated Contact with Patient 03/13/22 1528     (approximate)   History   Altered Mental Status  Level V Caveat:  AMS  HPI  Theresa Warren is a 66 y.o. female presents the ER to the be evaluated for altered mental status.  Patient with extensive past medical history including admission for sepsis, diabetes chronic wounds.  Patient seems to be very poor historian.  Does not report any history of O2 requirement but she is hypoxic requiring supplemental oxygen.     Physical Exam   Triage Vital Signs: ED Triage Vitals  Enc Vitals Group     BP 03/13/22 1434 (!) 132/52     Pulse Rate 03/13/22 1434 95     Resp 03/13/22 1434 18     Temp 03/13/22 1434 97.7 F (36.5 C)     Temp Source 03/13/22 1434 Oral     SpO2 03/13/22 1434 94 %     Weight 03/13/22 1436 221 lb 1.9 oz (100.3 kg)     Height 03/13/22 1436 5\' 2"  (1.575 m)     Head Circumference --      [place] Flow --      Pain Score --      Pain Loc --      Pain Edu? --      Excl. in [place]? --     Most recent vital signs: Vitals:   03/13/22 2000 03/13/22 2030  BP: (!) 120/58 (!) 133/57  Pulse: 87 87  Resp: 12 10  Temp:    SpO2: 99% 100%     Constitutional: Drowsy and encephalopathic, chronically ill-appearing Eyes: Conjunctivae are normal.  Head: Atraumatic. Nose: No congestion/rhinnorhea. Mouth/Throat: Mucous membranes are moist.   Neck: Painless ROM.  Cardiovascular:   Good peripheral circulation.  Extensive pitting edema in all 4 extremities Respiratory: No significant tachypnea does have acute hypoxic respiratory failure requiring supplemental oxygen with diminished bibasilar breath sounds and crackles. Gastrointestinal: Soft and nontender.  Musculoskeletal:  no deformity Neurologic: Encephalopathic with bizarre responses to questions. Psychiatric: Unable to assess    ED Results / Procedures / Treatments   Labs (all  labs ordered are listed, but only abnormal results are displayed) Labs Reviewed  COMPREHENSIVE METABOLIC PANEL - Abnormal; Notable for the following components:      Result Value   Glucose, Bld 152 (*)    Creatinine, Ser 2.95 (*)    Calcium 8.5 (*)    Total Protein 5.8 (*)    Albumin 1.7 (*)    Alkaline Phosphatase 137 (*)    GFR, Estimated 17 (*)    All other components within normal limits  CBC WITH DIFFERENTIAL/PLATELET - Abnormal; Notable for the following components:   WBC 14.6 (*)    RBC 2.63 (*)    Hemoglobin 7.7 (*)    HCT 25.5 (*)    RDW 19.9 (*)    Neutro Abs 11.7 (*)    Monocytes Absolute 1.2 (*)    Abs Immature Granulocytes 0.14 (*)    All other components within normal limits  PROTIME-INR - Abnormal; Notable for the following components:   Prothrombin Time 18.0 (*)    INR 1.5 (*)    All other components within normal limits  BLOOD GAS, VENOUS - Abnormal; Notable for the following components:   pO2, Ven 47 (*)    All other components within normal limits  BRAIN NATRIURETIC PEPTIDE - Abnormal;  Notable for the following components:   B Natriuretic Peptide 454.9 (*)    All other components within normal limits  CULTURE, BLOOD (ROUTINE X 2)  CULTURE, BLOOD (ROUTINE X 2)  LACTIC ACID, PLASMA  URINALYSIS, ROUTINE W REFLEX MICROSCOPIC  LACTIC ACID, PLASMA  BASIC METABOLIC PANEL  CBC  HEPATITIS B SURFACE ANTIGEN  HEPATITIS B SURFACE ANTIBODY,QUALITATIVE  HEPATITIS B SURFACE ANTIBODY, QUANTITATIVE  HEPATITIS B CORE ANTIBODY, TOTAL  HEPATITIS C ANTIBODY  PROCALCITONIN  CBG MONITORING, ED     EKG  ED ECG REPORT I, Merlyn Lot, the attending physician, personally viewed and interpreted this ECG.   Date: 03/13/2022  EKG Time: 14:40  Rate: 90  Rhythm: sinus  Axis: normal  Intervals: normal  ST&T Change: no stemi, no depression    RADIOLOGY Please see ED Course for my review and interpretation.  I personally reviewed all radiographic images ordered  to evaluate for the above acute complaints and reviewed radiology reports and findings.  These findings were personally discussed with the patient.  Please see medical record for radiology report.    PROCEDURES:  Critical Care performed: yes  .Critical Care  Performed by: Merlyn Lot, MD Authorized by: Merlyn Lot, MD   Critical care provider statement:    Critical care time (minutes):  35   Critical care was necessary to treat or prevent imminent or life-threatening deterioration of the following conditions:  Respiratory failure   Critical care was time spent personally by me on the following activities:  Ordering and performing treatments and interventions, ordering and review of laboratory studies, ordering and review of radiographic studies, pulse oximetry, re-evaluation of patient's condition, review of old charts, obtaining history from patient or surrogate, examination of patient, evaluation of patient's response to treatment, discussions with primary provider, discussions with consultants and development of treatment plan with patient or surrogate    MEDICATIONS ORDERED IN ED: Medications  acetaminophen (TYLENOL) tablet 650 mg (has no administration in time range)    Or  acetaminophen (TYLENOL) suppository 650 mg (has no administration in time range)  ondansetron (ZOFRAN) tablet 4 mg (has no administration in time range)    Or  ondansetron (ZOFRAN) injection 4 mg (has no administration in time range)  heparin injection 5,000 Units (has no administration in time range)  senna-docusate (Senokot-S) tablet 1 tablet (has no administration in time range)  Chlorhexidine Gluconate Cloth 2 % PADS 6 each (has no administration in time range)  oxyCODONE (Oxy IR/ROXICODONE) immediate release tablet 5 mg (has no administration in time range)  atorvastatin (LIPITOR) tablet 40 mg (has no administration in time range)  metoprolol tartrate (LOPRESSOR) tablet 25 mg (has no  administration in time range)  insulin glargine-yfgn (SEMGLEE) injection 15 Units (has no administration in time range)  multivitamin (RENA-VIT) tablet 1 tablet (has no administration in time range)  insulin aspart (novoLOG) injection 0-6 Units (has no administration in time range)  insulin aspart (novoLOG) injection 0-5 Units (has no administration in time range)     IMPRESSION / MDM / ASSESSMENT AND PLAN / ED COURSE  I reviewed the triage vital signs and the nursing notes.                              Differential diagnosis includes, but is not limited to, CHF, COPD, pneumonia, sepsis, anemia, electrolyte abnormality, SAH, IPH, medication effect  Patient presented to the ER for evaluation of symptoms as described above.  This  presenting complaint could reflect a potentially life-threatening illness therefore the patient will be placed on continuous pulse oximetry and telemetry for monitoring.  Laboratory evaluation will be sent to evaluate for the above complaints.   Somewhat odd behaving does appear encephalopathic and suspect substance related given her clinical presentation she is hypoxic however do not appreciate any wheezing does have evidence of diffuse edema and suspect volume overload and CHF.  She is not septic.  Her wounds appear at baseline.   Clinical Course as of 03/13/22 2041  Fri Mar 13, 2022  1531 Chest x-ray on my review and interpretation shows evidence of cardiomegaly findings concerning for mild CHF. [PR]  3845 Patient noted to be hypoxic on room air down to 85%.  She is put on 2 L. [PR]  3646 Given her elevated BNP, lower suspicion for sepsis given lack of fever.  She is a chronic leukocytosis.  I think is more reflective of volume overload CHF and pulmonary edema.  I will consult nephrology.  Will consult hospitalist for admission. [PR]    Clinical Course User Index [PR] Merlyn Lot, MD      FINAL CLINICAL IMPRESSION(S) / ED DIAGNOSES   Final diagnoses:   Altered mental status, unspecified altered mental status type  Acute respiratory failure with hypoxia ([place])     Rx / DC Orders   ED Discharge Orders     None        Note:  This document was prepared using Dragon voice recognition software and may include unintentional dictation errors.    Merlyn Lot, MD 03/13/22 2041

## 2022-03-13 NOTE — Assessment & Plan Note (Addendum)
Most likely with missed dialysis, mental status appears to be at her baseline today and she received dialysis yesterday. Continue to monitor

## 2022-03-13 NOTE — ED Notes (Addendum)
First nurse note: pt was d/ced from this hospital about a week ago and sent [facility]- pt is now having AMS- pt is normally able to hold a conversation, but is unable to do so now- pt has multiple wounds on her backside and "a calcium problem" per EMS- VSS per EMS- pt does have a port- pt is in a stretcher in triage middle

## 2022-03-13 NOTE — Assessment & Plan Note (Signed)
Procalcitonin mildly elevated than the prior check of 1.99, currently at 2.12.  Has chronic wounds but no other sign of infection. -Continue to monitor
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
## 2022-03-13 NOTE — ED Notes (Signed)
Pt RA sats 84%. Pt placed on 2L [place]. Oxygen saturation reading 96%.

## 2022-03-13 NOTE — ED Notes (Signed)
RN at bedside to attempt to collect VBG. Pt refusing blood work and will not let this RN access arm with IV. MD made aware.

## 2022-03-13 NOTE — Hospital Course (Addendum)
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  Initial vitals in the emergency department showed temperature of 97.7, respiration rate of 18, heart rate of 95, blood pressure 132/52, SPO2 of 94% on room air.  Serum sodium is 139, potassium 3.5, chloride 103, bicarb 27, BUN of 16, serum creatinine of 2.95, GFR 17, nonfasting blood glucose 152, WBC was 14.6, hemoglobin 7.7, platelets of 288.  BNP was elevated at 455.  Lactic acid was 1.0.  ED treatment: None. ED provider discussed with nephrology service who recommends admission for hemodialysis.  8/12: Patient received dialysis yesterday.  Hemodynamically stable.  Stable leukocytosis.  Procalcitonin at 2.12 which is slight increase from her prior procalcitonin of 1.99.  No obvious sign of infection at her chronic wounds. Patient appears little confused and having paranoid thoughts stating that someone is trying to harm her.  A cousin who was at bedside told that this is being going on since May, whenever there is a movement in certain she becomes scared and started crying. Psych evaluation requested. We will keep observing her without antibiotics.  Prognosis remains very guarded.
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
## 2022-03-13 NOTE — ED Notes (Signed)
This RN and another Therapist, sports at bedside. Pt reluctant and refusing for this RN to change her. Pt eventually agreeable. Pt brief dry. Purewick in place.

## 2022-03-13 NOTE — ED Triage Notes (Addendum)
Pt to ED via [facility] from [facility]. Facility called due to AMS. Pt has necrotic ulcers on legs and buttocks. Pt recently released after being admitted for sepsis. Pt was not d/c with antibiotics per EMS. Pt on 2L [place] unsure if that is new or chronic. Pt disoriented x4.

## 2022-03-14 DIAGNOSIS — R410 Disorientation, unspecified: Secondary | ICD-10-CM | POA: Diagnosis not present

## 2022-03-14 LAB — BASIC METABOLIC PANEL
Anion gap: 6 (ref 5–15)
BUN: 12 mg/dL (ref 8–23)
CO2: 29 mmol/L (ref 22–32)
Calcium: 8.1 mg/dL — ABNORMAL LOW (ref 8.9–10.3)
Chloride: 101 mmol/L (ref 98–111)
Creatinine, Ser: 2.08 mg/dL — ABNORMAL HIGH (ref 0.44–1.00)
GFR, Estimated: 26 mL/min — ABNORMAL LOW (ref >60)
Glucose, Bld: 138 mg/dL — ABNORMAL HIGH (ref 70–99)
Potassium: 3.2 mmol/L — ABNORMAL LOW (ref 3.5–5.1)
Sodium: 136 mmol/L (ref 135–145)

## 2022-03-14 LAB — CBC
HCT: 23.6 % — ABNORMAL LOW (ref 36.0–46.0)
HCT: 26.1 % — ABNORMAL LOW (ref 36.0–46.0)
Hemoglobin: 7.1 g/dL — ABNORMAL LOW (ref 12.0–15.0)
Hemoglobin: 8 g/dL — ABNORMAL LOW (ref 12.0–15.0)
MCH: 29.1 pg (ref 26.0–34.0)
MCH: 28.9 pg (ref 26.0–34.0)
MCHC: 30.1 g/dL (ref 30.0–36.0)
MCHC: 30.7 g/dL (ref 30.0–36.0)
MCV: 96.7 fL (ref 80.0–100.0)
MCV: 94.2 fL (ref 80.0–100.0)
Platelets: 230 10*3/uL (ref 150–400)
Platelets: 296 10*3/uL (ref 150–400)
RBC: 2.44 MIL/uL — ABNORMAL LOW (ref 3.87–5.11)
RBC: 2.77 MIL/uL — ABNORMAL LOW (ref 3.87–5.11)
RDW: 19.4 % — ABNORMAL HIGH (ref 11.5–15.5)
RDW: 19.5 % — ABNORMAL HIGH (ref 11.5–15.5)
WBC: 14.5 10*3/uL — ABNORMAL HIGH (ref 4.0–10.5)
WBC: 16.8 10*3/uL — ABNORMAL HIGH (ref 4.0–10.5)
nRBC: 0 % (ref 0.0–0.2)
nRBC: 0 % (ref 0.0–0.2)

## 2022-03-14 LAB — CBG MONITORING, ED
Glucose-Capillary: 132 mg/dL — ABNORMAL HIGH (ref 70–99)
Glucose-Capillary: 109 mg/dL — ABNORMAL HIGH (ref 70–99)

## 2022-03-14 LAB — GLUCOSE, CAPILLARY: Glucose-Capillary: 134 mg/dL — ABNORMAL HIGH (ref 70–99)

## 2022-03-14 LAB — HEPATITIS B CORE ANTIBODY, TOTAL: Hep B Core Total Ab: NONREACTIVE

## 2022-03-14 LAB — HEPATITIS B SURFACE ANTIBODY,QUALITATIVE: Hep B S Ab: REACTIVE — AB

## 2022-03-14 LAB — HEPATITIS C ANTIBODY: HCV Ab: NONREACTIVE

## 2022-03-14 LAB — HEPATITIS B SURFACE ANTIGEN: Hepatitis B Surface Ag: NONREACTIVE

## 2022-03-14 LAB — PROCALCITONIN: Procalcitonin: 2.21 ng/mL

## 2022-03-14 MED ORDER — HEPARIN SODIUM (PORCINE) 1000 UNIT/ML DIALYSIS
1000.0000 [IU] | INTRAMUSCULAR | Status: DC | PRN
Start: 2022-03-14 — End: 2022-03-18
  Administered 2022-03-14: 1000 [IU]

## 2022-03-14 MED ORDER — ALTEPLASE 2 MG IJ SOLR: 2.0000 mg | Freq: Once | INTRAMUSCULAR | Status: DC | PRN

## 2022-03-14 MED ORDER — LIDOCAINE-PRILOCAINE 2.5-2.5 % EX CREA: 1.0000 | TOPICAL_CREAM | CUTANEOUS | Status: DC | PRN

## 2022-03-14 MED ORDER — ANTICOAGULANT SODIUM CITRATE 4% (200MG/5ML) IV SOLN
5.0000 mL | Status: DC | PRN
Start: 2022-03-14 — End: 2022-03-18

## 2022-03-14 MED ORDER — LIDOCAINE HCL (PF) 1 % IJ SOLN: 5.0000 mL | INTRAMUSCULAR | Status: DC | PRN

## 2022-03-14 MED ORDER — PENTAFLUOROPROP-TETRAFLUOROETH EX AERO: 1.0000 | INHALATION_SPRAY | CUTANEOUS | Status: DC | PRN

## 2022-03-14 NOTE — ED Notes (Signed)
Pt asking for a sandwich, no sandwiches available. Pt was offered a meal tray she said it stunk and she did not want it. This nurse asked multiple times what she would like instead, pt would not answer. AOC was asked to make some sandwiches for the pts and they were brought to pt.

## 2022-03-14 NOTE — Progress Notes (Signed)
Patient states she does not wear cpap/bipap unit at home and does not want to wear a unit here while in the hospital.

## 2022-03-14 NOTE — ED Notes (Signed)
Metoprolol held at this time, previous BP 106/48, held at this time in case for need in the future.
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
## 2022-03-14 NOTE — Assessment & Plan Note (Signed)
Estimated body mass index is 41.09 kg/m as calculated from the following:   Height as of this encounter: 5\' 2"  (1.575 m).   Weight as of this encounter: 101.9 kg.   -This will complicate overall prognosis
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
## 2022-03-14 NOTE — Progress Notes (Signed)
Received patient in bed to unit.  Alert and oriented.  Informed consent signed and in  chart.   Treatment initiated: 1350 Treatment completed: 1643  Patient tolerated well.  Transported back to the room  alert, without acute distress.  Hand-off given to patient's nurse.   Access used: Catheter Access issues: none  Total UF removed: 2500 Medication(s) given: none Post HD VS: 98.2, 113/58 (74), HR-110, RR-18, SP02-96 Post HD weight: 99.1kg   [facility]

## 2022-03-14 NOTE — ED Notes (Signed)
Pt continues to mumble and sounds like whine. Pt asked if she is OK and she said she is just thinking about what is going on in her life. Pt was also concerned that she would not be able to make it to dialysis. It was explained to pt that she already went and it had not even been 12 hours since she finished. Pt did not remember her dialysis. Pt declined light, did let AM labs be drawn.
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
## 2022-03-15 DIAGNOSIS — F22 Delusional disorders: Secondary | ICD-10-CM | POA: Diagnosis not present

## 2022-03-15 DIAGNOSIS — R410 Disorientation, unspecified: Secondary | ICD-10-CM | POA: Diagnosis not present

## 2022-03-15 LAB — BLOOD CULTURE ID PANEL (REFLEXED) - BCID2

## 2022-03-15 LAB — GLUCOSE, CAPILLARY
Glucose-Capillary: 69 mg/dL — ABNORMAL LOW (ref 70–99)
Glucose-Capillary: 83 mg/dL (ref 70–99)
Glucose-Capillary: 77 mg/dL (ref 70–99)
Glucose-Capillary: 93 mg/dL (ref 70–99)
Glucose-Capillary: 96 mg/dL (ref 70–99)

## 2022-03-15 MED ORDER — RISPERIDONE 0.5 MG PO TABS
0.5000 mg | ORAL_TABLET | Freq: Two times a day (BID) | ORAL | Status: DC
  Administered 2022-03-15 – 2022-03-20 (×8): 0.5 mg via ORAL
  Filled 2022-03-15 (×12): qty 1

## 2022-03-15 MED ORDER — VANCOMYCIN HCL 2000 MG/400ML IV SOLN
2000.0000 mg | Freq: Once | INTRAVENOUS | Status: AC
  Administered 2022-03-16: 2000 mg via INTRAVENOUS
  Filled 2022-03-15: qty 400

## 2022-03-15 MED ORDER — VANCOMYCIN HCL 500 MG/100ML IV SOLN
500.0000 mg | INTRAVENOUS | Status: DC
  Administered 2022-03-16: 500 mg via INTRAVENOUS
  Filled 2022-03-15: qty 100

## 2022-03-15 NOTE — Progress Notes (Signed)
       CROSS COVER NOTE  NAME: Theresa Warren MRN: [id number] DOB : Feb 14, 1956    Date of Service   03/15/22  HPI/Events of Note   Notified by pharmacist of BCID results Staph epi in 2/2 bottles, mecA/C detected. Patient is afebrile, has a leukocytosis of 16.8 that has been trending up over the last few days.  Interventions   Plan: Vancomycin per pharm     This document was prepared using Dragon voice recognition software and may include unintentional dictation errors.  Neomia Glass DNP, MHA, FNP-BC Nurse Practitioner [facility] Pager [phone]
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
## 2022-03-15 NOTE — Assessment & Plan Note (Signed)
-   Insulin glargine 15 units nightly resumed - Insulin SSI with at bedtime coverage ordered
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
## 2022-03-15 NOTE — Assessment & Plan Note (Signed)
Procalcitonin mildly elevated than the prior check of 1.99, currently at 2.12.  Has chronic wounds but no other sign of infection. -Continue to monitor

## 2022-03-15 NOTE — Assessment & Plan Note (Signed)
On metoprolol 50 mg twice a day 

## 2022-03-15 NOTE — Assessment & Plan Note (Signed)
-   Complicated wound, possible calciphylaxis but biopsy did not prove it. - Patient would benefit from multi disciplinary surgical team available with plastics surgery, reconstructive surgery, and dermatology - Follow-up with the above outpatient -Wound care consult while in the hospital

## 2022-03-15 NOTE — Assessment & Plan Note (Signed)
-   CPAP nightly ordered-patient refusing to use it
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
## 2022-03-15 NOTE — Progress Notes (Signed)
PHARMACY - PHYSICIAN COMMUNICATION CRITICAL VALUE ALERT - BLOOD CULTURE IDENTIFICATION (BCID)  Results for orders placed or performed during the hospital encounter of 03/13/22  Culture, blood (Routine x 2)     Status: None (Preliminary result)   Collection Time: 03/13/22  2:39 PM   Specimen: BLOOD  Result Value Ref Range Status   Specimen Description BLOOD RIGHT ANTECUBITAL  Final   Special Requests   Final    BOTTLES DRAWN AEROBIC AND ANAEROBIC Blood Culture adequate volume   Culture   Final    NO GROWTH 2 DAYS Performed at [facility], [address] [place], [place] [address]    Report Status PENDING  Incomplete  Culture, blood (Routine X 2) w Reflex to ID Panel     Status: None (Preliminary result)   Collection Time: 03/14/22  9:51 PM   Specimen: BLOOD  Result Value Ref Range Status   Specimen Description BLOOD LEFT AC  Final   Special Requests   Final    BOTTLES DRAWN AEROBIC AND ANAEROBIC Blood Culture adequate volume   Culture  Setup Time   Final    Organism ID to follow GRAM POSITIVE COCCI IN BOTH AEROBIC AND ANAEROBIC BOTTLES CRITICAL RESULT CALLED TO, READ BACK BY AND VERIFIED WITH: Toshia Larkin BLUE 03/15/22 2257 DE    Culture   Final    NO GROWTH < 12 HOURS Performed at [facility], [address] [place], [place] [address]    Report Status PENDING  Incomplete   BCID results for this pt: 2 of 2 bottles w/ Staph Epi, mecA/C detected.  Pt not on any abx at this time. WBC trending up, now 16.8.   Name of provider contacted: Morton Amy, NP   Changes to prescribed antibiotics required: Start Vancomycin per pharmacy consult.  Renda Rolls, PharmD, [facility] 03/15/2022 11:11 PM

## 2022-03-15 NOTE — Progress Notes (Signed)
Pharmacy Antibiotic Note  Theresa Warren is a 66 y.o. female admitted on 03/13/2022 with bacteremia.  Pharmacy has been consulted for Vancomycin dosing.  Plan: Pt given Vancomycin 2000 mg once. Vancomycin 500 mg IV Q 24 hrs. Goal AUC 400-550. Expected AUC: 479.8 SCr used: 2.08, Vd used: 0.5, BMI: 37.5  Pharmacy will continue to follow and will adjust abx dosing whenever warranted.  Height: 5\' 2"  (157.5 cm) Weight: 93.3 kg (205 lb 11 oz) IBW/kg (Calculated) : 50.1  Temp (24hrs), Avg:98.6 F (37 C), Min:98.4 F (36.9 C), Max:98.6 F (37 C)   Recent Labs  Lab 03/13/22 1450 03/13/22 1639 03/14/22 0424 03/14/22 2151  WBC 14.6*  --  14.5* 16.8*  CREATININE 2.95*  --  2.08*  --   LATICACIDVEN  --  1.0  --   --     Estimated Creatinine Clearance: 29.1 mL/min (A) (by C-G formula based on SCr of 2.08 mg/dL (H)).    Allergies  Allergen Reactions   Codeine Nausea And Vomiting    Other reaction(s): nausea and vomiting, vomiting   Penicillins Rash and Hives    Other reaction(s): systemic rash   Carvedilol     Other reaction(s): high sugar, headache, cough   Fluoxetine     Other reaction(s): took in 1990 . made her worse.   Liraglutide     Other reaction(s): too nauseated.   Pioglitazone     Other reaction(s): edema and osteoporosis   Sulfa Antibiotics Swelling   Clindamycin Hcl Rash   Clindamycin/Lincomycin Rash   Dilaudid [Hydromorphone Hcl] Rash    Antimicrobials this admission: 8/14 Vancomycin >>   Microbiology results: 8/12 BCx: 2 of 2 bottles w/ Staph epi, mecA/C detected  Thank you for allowing pharmacy to be a part of this patient's care.  Renda Rolls, PharmD, [facility] 03/15/2022 11:29 PM
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
## 2022-03-15 NOTE — Progress Notes (Addendum)
PT Cancellation Note  Patient Details Name: Theresa Warren MRN: [id number] DOB: August 16, 1955   Cancelled Treatment:    Reason Eval/Treat Not Completed:  (Patient sleeping, friend/POA at the bedside. The friend politely declined PT today, reporting the patient would not be up for it. She did not want me to wake the patient up and is requesting for PT to return tomorrow. Will continue with attempts.)  Minna Merritts, PT, MPT  Percell Locus 03/15/2022, 2:11 PM
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
## 2022-03-16 DIAGNOSIS — L89150 Pressure ulcer of sacral region, unstageable: Secondary | ICD-10-CM | POA: Diagnosis present

## 2022-03-16 DIAGNOSIS — B957 Other staphylococcus as the cause of diseases classified elsewhere: Secondary | ICD-10-CM

## 2022-03-16 DIAGNOSIS — L89159 Pressure ulcer of sacral region, unspecified stage: Secondary | ICD-10-CM | POA: Diagnosis present

## 2022-03-16 DIAGNOSIS — E11649 Type 2 diabetes mellitus with hypoglycemia without coma: Secondary | ICD-10-CM | POA: Diagnosis not present

## 2022-03-16 DIAGNOSIS — Z992 Dependence on renal dialysis: Secondary | ICD-10-CM | POA: Diagnosis not present

## 2022-03-16 DIAGNOSIS — F600 Paranoid personality disorder: Secondary | ICD-10-CM | POA: Diagnosis present

## 2022-03-16 DIAGNOSIS — R400 Somnolence: Secondary | ICD-10-CM | POA: Diagnosis not present

## 2022-03-16 DIAGNOSIS — E1122 Type 2 diabetes mellitus with diabetic chronic kidney disease: Secondary | ICD-10-CM | POA: Diagnosis present

## 2022-03-16 DIAGNOSIS — D649 Anemia, unspecified: Secondary | ICD-10-CM | POA: Diagnosis not present

## 2022-03-16 DIAGNOSIS — R7881 Bacteremia: Secondary | ICD-10-CM | POA: Diagnosis present

## 2022-03-16 DIAGNOSIS — Z6841 Body Mass Index (BMI) 40.0 and over, adult: Secondary | ICD-10-CM | POA: Diagnosis not present

## 2022-03-16 DIAGNOSIS — D631 Anemia in chronic kidney disease: Secondary | ICD-10-CM | POA: Diagnosis present

## 2022-03-16 DIAGNOSIS — J9601 Acute respiratory failure with hypoxia: Secondary | ICD-10-CM | POA: Diagnosis present

## 2022-03-16 DIAGNOSIS — Z1629 Resistance to other single specified antibiotic: Secondary | ICD-10-CM

## 2022-03-16 DIAGNOSIS — E1151 Type 2 diabetes mellitus with diabetic peripheral angiopathy without gangrene: Secondary | ICD-10-CM | POA: Diagnosis present

## 2022-03-16 DIAGNOSIS — G928 Other toxic encephalopathy: Secondary | ICD-10-CM | POA: Diagnosis present

## 2022-03-16 DIAGNOSIS — Z515 Encounter for palliative care: Secondary | ICD-10-CM | POA: Diagnosis not present

## 2022-03-16 DIAGNOSIS — R410 Disorientation, unspecified: Secondary | ICD-10-CM | POA: Diagnosis not present

## 2022-03-16 DIAGNOSIS — I120 Hypertensive chronic kidney disease with stage 5 chronic kidney disease or end stage renal disease: Secondary | ICD-10-CM | POA: Diagnosis present

## 2022-03-16 DIAGNOSIS — G9340 Encephalopathy, unspecified: Secondary | ICD-10-CM | POA: Diagnosis not present

## 2022-03-16 DIAGNOSIS — Z66 Do not resuscitate: Secondary | ICD-10-CM | POA: Diagnosis present

## 2022-03-16 DIAGNOSIS — N186 End stage renal disease: Secondary | ICD-10-CM | POA: Diagnosis present

## 2022-03-16 DIAGNOSIS — F22 Delusional disorders: Secondary | ICD-10-CM | POA: Diagnosis present

## 2022-03-16 DIAGNOSIS — Z794 Long term (current) use of insulin: Secondary | ICD-10-CM | POA: Diagnosis not present

## 2022-03-16 DIAGNOSIS — I10 Essential (primary) hypertension: Secondary | ICD-10-CM | POA: Diagnosis not present

## 2022-03-16 DIAGNOSIS — E8779 Other fluid overload: Secondary | ICD-10-CM | POA: Diagnosis not present

## 2022-03-16 DIAGNOSIS — R4182 Altered mental status, unspecified: Secondary | ICD-10-CM | POA: Diagnosis present

## 2022-03-16 DIAGNOSIS — L89890 Pressure ulcer of other site, unstageable: Secondary | ICD-10-CM | POA: Diagnosis present

## 2022-03-16 DIAGNOSIS — L89310 Pressure ulcer of right buttock, unstageable: Secondary | ICD-10-CM | POA: Diagnosis present

## 2022-03-16 DIAGNOSIS — L89320 Pressure ulcer of left buttock, unstageable: Secondary | ICD-10-CM | POA: Diagnosis present

## 2022-03-16 LAB — GLUCOSE, CAPILLARY
Glucose-Capillary: 133 mg/dL — ABNORMAL HIGH (ref 70–99)
Glucose-Capillary: 120 mg/dL — ABNORMAL HIGH (ref 70–99)
Glucose-Capillary: 102 mg/dL — ABNORMAL HIGH (ref 70–99)
Glucose-Capillary: 108 mg/dL — ABNORMAL HIGH (ref 70–99)
Glucose-Capillary: 122 mg/dL — ABNORMAL HIGH (ref 70–99)

## 2022-03-16 LAB — CBC
HCT: 20 % — ABNORMAL LOW (ref 36.0–46.0)
Hemoglobin: 6.1 g/dL — ABNORMAL LOW (ref 12.0–15.0)
MCH: 29.2 pg (ref 26.0–34.0)
MCHC: 30.5 g/dL (ref 30.0–36.0)
MCV: 95.7 fL (ref 80.0–100.0)
Platelets: 214 10*3/uL (ref 150–400)
RBC: 2.09 MIL/uL — ABNORMAL LOW (ref 3.87–5.11)
RDW: 19.3 % — ABNORMAL HIGH (ref 11.5–15.5)
WBC: 11.7 10*3/uL — ABNORMAL HIGH (ref 4.0–10.5)
nRBC: 0 % (ref 0.0–0.2)

## 2022-03-16 LAB — BASIC METABOLIC PANEL
Anion gap: 7 (ref 5–15)
BUN: 23 mg/dL (ref 8–23)
CO2: 27 mmol/L (ref 22–32)
Calcium: 8.1 mg/dL — ABNORMAL LOW (ref 8.9–10.3)
Chloride: 101 mmol/L (ref 98–111)
Creatinine, Ser: 3.19 mg/dL — ABNORMAL HIGH (ref 0.44–1.00)
GFR, Estimated: 16 mL/min — ABNORMAL LOW (ref >60)
Glucose, Bld: 102 mg/dL — ABNORMAL HIGH (ref 70–99)
Potassium: 3.4 mmol/L — ABNORMAL LOW (ref 3.5–5.1)
Sodium: 135 mmol/L (ref 135–145)

## 2022-03-16 LAB — PREPARE RBC (CROSSMATCH)

## 2022-03-16 MED ORDER — VANCOMYCIN HCL IN DEXTROSE 1-5 GM/200ML-% IV SOLN
1000.0000 mg | INTRAVENOUS | Status: DC
  Administered 2022-03-21: 1000 mg via INTRAVENOUS
  Filled 2022-03-16 (×5): qty 200

## 2022-03-16 MED ORDER — SODIUM CHLORIDE 0.9 % IV SOLN
25.0000 g | INTRAVENOUS | Status: DC
  Administered 2022-03-21: 25 g via INTRAVENOUS
  Filled 2022-03-16 (×3): qty 100

## 2022-03-16 MED ORDER — FENTANYL 25 MCG/HR TD PT72
1.0000 | MEDICATED_PATCH | TRANSDERMAL | Status: DC
  Administered 2022-03-16: 1 via TRANSDERMAL
  Filled 2022-03-16: qty 1

## 2022-03-16 MED ORDER — SODIUM CHLORIDE 0.9% IV SOLUTION: Freq: Once | INTRAVENOUS | Status: AC

## 2022-03-16 NOTE — Progress Notes (Signed)
Patient continues to refuse CPAP/BIPAP

## 2022-03-16 NOTE — Progress Notes (Signed)
Pharmacy Antibiotic Note  Theresa Warren is a 66 y.o. female admitted on 03/13/2022 with bacteremia.  Past medical history includes ESRD on HD T/Th/Sat. Blood culture resulting 2/2 MRSE. Pharmacy has been consulted for Vancomycin dosing.  Plan: Vancomycin 1,000 every T/Th/Sat after HD. (Wt > 80 kg)  Follow HD plans and dose accordingly. Pharmacy will continue to follow and will adjust abx dosing whenever warranted.  Height: 5\' 2"  (157.5 cm) Weight: 93.3 kg (205 lb 11 oz) IBW/kg (Calculated) : 50.1  Temp (24hrs), Avg:98.8 F (37.1 C), Min:98.1 F (36.7 C), Max:100.2 F (37.9 C)   Recent Labs  Lab 03/13/22 1450 03/13/22 1639 03/14/22 0424 03/14/22 2151 03/16/22 0437  WBC 14.6*  --  14.5* 16.8* 11.7*  CREATININE 2.95*  --  2.08*  --  3.19*  LATICACIDVEN  --  1.0  --   --   --      Estimated Creatinine Clearance: 19 mL/min (A) (by C-G formula based on SCr of 3.19 mg/dL (H)).    Allergies  Allergen Reactions   Codeine Nausea And Vomiting    Other reaction(s): nausea and vomiting, vomiting   Penicillins Rash and Hives    Other reaction(s): systemic rash   Carvedilol     Other reaction(s): high sugar, headache, cough   Fluoxetine     Other reaction(s): took in 1990 . made her worse.   Liraglutide     Other reaction(s): too nauseated.   Pioglitazone     Other reaction(s): edema and osteoporosis   Sulfa Antibiotics Swelling   Clindamycin Hcl Rash   Clindamycin/Lincomycin Rash   Dilaudid [Hydromorphone Hcl] Rash    Antimicrobials this admission: 8/14 Vancomycin >>   Microbiology results: 8/12 BCx: 2 of 2 bottles w/ Staph epi, mecA/C detected  Thank you for allowing pharmacy to be a part of this patient's care.  Glean Salvo, PharmD Clinical Pharmacist  03/16/2022 4:16 PM

## 2022-03-16 NOTE — Assessment & Plan Note (Signed)
Hemoglobin dropped to 6.1 this morning.  No obvious bleeding. -2 unit of PRBC -Continue to monitor -Transfuse if below 7

## 2022-03-16 NOTE — Consult Note (Signed)
NAME: Theresa Warren  DOB: 1955-12-20  MRN: [id number]  Date/Time: 03/16/2022 11:41 AM  REQUESTING PROVIDER: dr.Amin Subjective:  REASON FOR CONSULT: Staph epidermidis bacteremia ?no history available from patient as she is somnolent from pain meds Theresa Warren is a 66 y.o. with a history of ESRD on dialysis started in May 2023, DM, HTN, Chronic pain syndrome, calciphylaxis wounds for the past 2 months, h/o vaginal bleed , anemia needing PRBC,  prolonged stay in the hospital recently from 12/31/21 until 02/10/22 for wounds and sent to rehab, Then again admitted 7/23-7/31 due to fall, sliding out of her bed, wounds were not overtly infected- pain control was an issue  She presented to the ED by EMS for altered mental status Vitals in the ED 97.7, RR 18, HR 95, BP 132/52 NA 139, K 3.5, Cl 103, BUN 16 glucose 152, WBC 14.6, HB 7.7, PLT 288 CT head no acute issues. CXR interstitial edema She was thought to probably have fluid overload due to not having dialysis on that day of admission I am asked to see the patient because of blood culture being positive for staph epidermidis Pt is sitting in bed and nodding to sleep On waking her up she says she is in constant pain and hence needs pain meds  Past Medical History:  Diagnosis Date   CKD (chronic kidney disease), stage III ([place])    Diabetes ([place])    Hyperlipidemia    Hypertension    Obesity     Past Surgical History:  Procedure Laterality Date   ACHILLES TENDON REPAIR     ANKLE SURGERY     DIALYSIS/PERMA CATHETER INSERTION N/A 01/09/2022   Procedure: DIALYSIS/PERMA CATHETER INSERTION;  Surgeon: Katha Cabal, MD;  Location: [place] [facility];  Service: Cardiovascular;  Laterality: N/A;   INCISION AND DRAINAGE ABSCESS Bilateral 01/01/2022   Procedure: INCISION AND DRAINAGE ABSCESS-Sacral Decubitus;  Surgeon: Olean Ree, MD;  Location: [facility];  Service: General;  Laterality: Bilateral;   URETHRAL STRICTURE DILATATION       Social History   Socioeconomic History   Marital status: Divorced    Spouse name: Not on file   Number of children: Not on file   Years of education: Not on file   Highest education level: Not on file  Occupational History   Occupation: Works [employer]  Tobacco Use   Smoking status: Never   Smokeless tobacco: Not on file  Substance and Sexual Activity   Alcohol use: Not Currently   Drug use: Not Currently   Sexual activity: Not Currently  Other Topics Concern   Not on file  Social History Narrative   Lives alone   Social Determinants of Health   Financial Resource Strain: Not on file  Food Insecurity: Not on file  Transportation Needs: Not on file  Physical Activity: Not on file  Stress: Not on file  Social Connections: Not on file  Intimate Partner Violence: Not on file    Family History  Problem Relation Age of Onset   Alzheimer's disease Father    Diabetes Father    CAD Father 69   CAD Other        Multliple maternal aunts and uncles with early onset heart disease   Lung cancer Sister    ALS Mother    Allergies  Allergen Reactions   Codeine Nausea And Vomiting    Other reaction(s): nausea and vomiting, vomiting   Penicillins Rash and Hives    Other reaction(s):  systemic rash   Carvedilol     Other reaction(s): high sugar, headache, cough   Fluoxetine     Other reaction(s): took in 1990 . made her worse.   Liraglutide     Other reaction(s): too nauseated.   Pioglitazone     Other reaction(s): edema and osteoporosis   Sulfa Antibiotics Swelling   Clindamycin Hcl Rash   Clindamycin/Lincomycin Rash   Dilaudid [Hydromorphone Hcl] Rash   I? Current Facility-Administered Medications  Medication Dose Route Frequency Provider Last Rate Last Admin   acetaminophen (TYLENOL) tablet 650 mg  650 mg Oral Q6H PRN Cox, Amy N, DO   650 mg at 03/16/22 1121   Or   acetaminophen (TYLENOL) suppository 650 mg  650 mg Rectal Q6H PRN Cox, Amy N, DO        alteplase (CATHFLO ACTIVASE) injection 2 mg  2 mg Intracatheter Once PRN Lyla Son, MD       anticoagulant sodium citrate solution 5 mL  5 mL Intracatheter PRN Lyla Son, MD       atorvastatin (LIPITOR) tablet 40 mg  40 mg Oral Daily Cox, Amy N, DO   40 mg at 03/16/22 1111   Chlorhexidine Gluconate Cloth 2 % PADS 6 each  6 each Topical Q0600 Cox, Amy N, DO   6 each at 03/16/22 0410   heparin injection 1,000 Units  1,000 Units Intracatheter PRN Lyla Son, MD   1,000 Units at 03/14/22 1658   insulin aspart (novoLOG) injection 0-5 Units  0-5 Units Subcutaneous QHS Cox, Amy N, DO       insulin aspart (novoLOG) injection 0-6 Units  0-6 Units Subcutaneous TID WC Cox, Amy N, DO       insulin glargine-yfgn (SEMGLEE) injection 15 Units  15 Units Subcutaneous QHS Cox, Amy N, DO   15 Units at 03/15/22 2238   lidocaine (PF) (XYLOCAINE) 1 % injection 5 mL  5 mL Intradermal PRN Lyla Son, MD       lidocaine-prilocaine (EMLA) cream 1 Application  1 Application Topical PRN Lyla Son, MD       metoprolol tartrate (LOPRESSOR) tablet 25 mg  25 mg Oral BID Cox, Amy N, DO   25 mg at 03/16/22 1111   multivitamin (RENA-VIT) tablet 1 tablet  1 tablet Oral QHS Cox, Amy N, DO   1 tablet at 03/15/22 2238   ondansetron (ZOFRAN) tablet 4 mg  4 mg Oral Q6H PRN Cox, Amy N, DO       Or   ondansetron (ZOFRAN) injection 4 mg  4 mg Intravenous Q6H PRN Cox, Amy N, DO       oxyCODONE (Oxy IR/ROXICODONE) immediate release tablet 5 mg  5 mg Oral Q6H PRN Cox, Amy N, DO   5 mg at 03/16/22 1118   pentafluoroprop-tetrafluoroeth (GEBAUERS) aerosol 1 Application  1 Application Topical PRN Lyla Son, MD       risperiDONE (RISPERDAL) tablet 0.5 mg  0.5 mg Oral BID Patrecia Pour, NP   0.5 mg at 03/16/22 1111   senna-docusate (Senokot-S) tablet 1 tablet  1 tablet Oral QHS PRN Cox, Amy N, DO       [START ON 03/17/2022] sodium thiosulfate 25 g in sodium chloride 0.9 % 200 mL Infusion for Calciphylaxis  25  g Intravenous Q T,Th,Sa-HD Breeze, Shantelle, NP       vancomycin (VANCOREADY) IVPB 500 mg/100 mL  500 mg Intravenous Q24H Renda Rolls, RPH 100 mL/hr at 03/16/22 0941 500 mg at 03/16/22 0941  Abtx:  Anti-infectives (From admission, onward)    Start     Dose/Rate Route Frequency Ordered Stop   03/16/22 0800  vancomycin (VANCOREADY) IVPB 500 mg/100 mL        500 mg 100 mL/hr over 60 Minutes Intravenous Every 24 hours 03/15/22 2334     03/16/22 0015  vancomycin (VANCOREADY) IVPB 2000 mg/400 mL        2,000 mg 200 mL/hr over 120 Minutes Intravenous  Once 03/15/22 2328 03/16/22 0213       REVIEW OF SYSTEMS:  Const: no fever, negative chills, negative weight loss Eyes: negative diplopia or visual changes, negative eye pain ENT: negative coryza, negative sore throat Resp: negative cough, hemoptysis, dyspnea Cards: negative for chest pain, palpitations, has lower extremity edema GU: negative for frequency, dysuria and hematuria GI: Negative for abdominal pain, diarrhea, bleeding, constipation Skin: skin lesions lower extremities Heme: negative for easy bruising and gum/nose bleeding MS: weakness Neurolo:drowsy, no calling her many times she will respond to questions Psych: anxiety, depression  Endocrine:  diabetes Allergy/Immunology- as above Objective:  VITALS:  BP 121/65 (BP Location: Left Arm)   Pulse (!) 109   Temp 98.1 F (36.7 C)   Resp 18   Ht 5\' 2"  (1.575 m)   Wt 93.3 kg   SpO2 91%   BMI 37.62 kg/m  LDA Rt HD cath  PHYSICAL EXAM:  General: drowsy, wakes up on calling her name and shaking=oriented in person, place Head: Normocephalic, without obvious abnormality, atraumatic. Eyes: Conjunctivae clear, anicteric sclerae. Pupils are equal ENT Nares normal. No drainage or sinus tenderness. Lips, mucosa, and tongue normal. No Thrush Neck:  symmetrical, no adenopathy, thyroid: non tender no carotid bruit and no JVD. Back: No CVA tenderness. Lungs: Clear to  auscultation bilaterally. No Wheezing or Rhonchi. No rales. Heart: Regular rate and rhythm, no murmur, rub or gallop. Abdomen: Soft, non-tender,not distended. Bowel sounds normal. No masses Extremities:edema legs Multiple necrotic lesions seen b/l lower extermties covered with eschar       Skin: No rashes or lesions. Or bruising Lymph: Cervical, supraclavicular normal. Neurologic: cannot assess in detail Pertinent Labs Lab Results CBC    Component Value Date/Time   WBC 11.7 (H) 03/16/2022 0437   RBC 2.09 (L) 03/16/2022 0437   HGB 6.1 (L) 03/16/2022 0437   HCT 20.0 (L) 03/16/2022 0437   PLT 214 03/16/2022 0437   MCV 95.7 03/16/2022 0437   MCH 29.2 03/16/2022 0437   MCHC 30.5 03/16/2022 0437   RDW 19.3 (H) 03/16/2022 0437   LYMPHSABS 1.2 03/13/2022 1450   MONOABS 1.2 (H) 03/13/2022 1450   EOSABS 0.4 03/13/2022 1450   BASOSABS 0.1 03/13/2022 1450       Latest Ref Rng & Units 03/16/2022    4:37 AM 03/14/2022    4:24 AM 03/13/2022    2:50 PM  CMP  Glucose 70 - 99 mg/dL 102  138  152   BUN 8 - 23 mg/dL 23  12  16    Creatinine 0.44 - 1.00 mg/dL 3.19  2.08  2.95   Sodium 135 - 145 mmol/L 135  136  139   Potassium 3.5 - 5.1 mmol/L 3.4  3.2  3.5   Chloride 98 - 111 mmol/L 101  101  103   CO2 22 - 32 mmol/L 27  29  27    Calcium 8.9 - 10.3 mg/dL 8.1  8.1  8.5   Total Protein 6.5 - 8.1 g/dL   5.8   Total Bilirubin  0.3 - 1.2 mg/dL   0.7   Alkaline Phos 38 - 126 U/L   137   AST 15 - 41 U/L   29   ALT 0 - 44 U/L   10       Microbiology: Recent Results (from the past 240 hour(s))  Culture, blood (Routine x 2)     Status: None (Preliminary result)   Collection Time: 03/13/22  2:39 PM   Specimen: BLOOD  Result Value Ref Range Status   Specimen Description BLOOD RIGHT ANTECUBITAL  Final   Special Requests   Final    BOTTLES DRAWN AEROBIC AND ANAEROBIC Blood Culture adequate volume   Culture   Final    NO GROWTH 3 DAYS Performed at [facility], [address] [place], [place] [address]    Report Status PENDING  Incomplete  Culture, blood (Routine X 2) w Reflex to ID Panel     Status: None (Preliminary result)   Collection Time: 03/14/22  9:51 PM   Specimen: BLOOD  Result Value Ref Range Status   Specimen Description   Final    BLOOD LEFT ANTECUBITAL Performed at [facility], [place] [address], [place], [place] [address]    Special Requests   Final    BOTTLES DRAWN AEROBIC AND ANAEROBIC Blood Culture adequate volume   Culture  Setup Time   Final    Organism ID to follow GRAM POSITIVE COCCI IN BOTH AEROBIC AND ANAEROBIC BOTTLES CRITICAL RESULT CALLED TO, READ BACK BY AND VERIFIED WITH: NATHAN BLUE 03/15/22 2257 DE    Culture   Final    NO GROWTH 2 DAYS Performed at [facility], [address] [place], [place] [address]    Report Status PENDING  Incomplete  Blood Culture ID Panel (Reflexed)     Status: Abnormal   Collection Time: 03/14/22  9:51 PM  Result Value Ref Range Status   Enterococcus faecalis NOT DETECTED NOT DETECTED Final   Enterococcus Faecium NOT DETECTED NOT DETECTED Final   Listeria monocytogenes NOT DETECTED NOT DETECTED Final   Staphylococcus species DETECTED (A) NOT DETECTED Final    Comment: CRITICAL RESULT CALLED TO, READ BACK BY AND VERIFIED WITH: NATHAN BELUE PHARMD AT 2255 03/15/2022 DE    Staphylococcus aureus (BCID) NOT DETECTED NOT DETECTED Final   Staphylococcus epidermidis DETECTED (A) NOT DETECTED Final    Comment: Methicillin (oxacillin) resistant coagulase negative staphylococcus. Possible blood culture contaminant (unless isolated from more than one blood culture draw or clinical case suggests pathogenicity). No antibiotic treatment is indicated for blood  culture contaminants. CRITICAL RESULT CALLED TO, READ BACK BY AND VERIFIED WITH: NATHAN BELUE PHARMD AT 2255 03/15/2022 DE    Staphylococcus lugdunensis NOT DETECTED NOT DETECTED Final   Streptococcus species NOT DETECTED NOT DETECTED  Final   Streptococcus agalactiae NOT DETECTED NOT DETECTED Final   Streptococcus pneumoniae NOT DETECTED NOT DETECTED Final   Streptococcus pyogenes NOT DETECTED NOT DETECTED Final   A.calcoaceticus-baumannii NOT DETECTED NOT DETECTED Final   Bacteroides fragilis NOT DETECTED NOT DETECTED Final   Enterobacterales NOT DETECTED NOT DETECTED Final   Enterobacter cloacae complex NOT DETECTED NOT DETECTED Final   Escherichia coli NOT DETECTED NOT DETECTED Final   Klebsiella aerogenes NOT DETECTED NOT DETECTED Final   Klebsiella oxytoca NOT DETECTED NOT DETECTED Final   Klebsiella pneumoniae NOT DETECTED NOT DETECTED Final   Proteus species NOT DETECTED NOT DETECTED Final   Salmonella species NOT DETECTED NOT DETECTED Final   Serratia  marcescens NOT DETECTED NOT DETECTED Final   Haemophilus influenzae NOT DETECTED NOT DETECTED Final   Neisseria meningitidis NOT DETECTED NOT DETECTED Final   Pseudomonas aeruginosa NOT DETECTED NOT DETECTED Final   Stenotrophomonas maltophilia NOT DETECTED NOT DETECTED Final   Candida albicans NOT DETECTED NOT DETECTED Final   Candida auris NOT DETECTED NOT DETECTED Final   Candida glabrata NOT DETECTED NOT DETECTED Final   Candida krusei NOT DETECTED NOT DETECTED Final   Candida parapsilosis NOT DETECTED NOT DETECTED Final   Candida tropicalis NOT DETECTED NOT DETECTED Final   Cryptococcus neoformans/gattii NOT DETECTED NOT DETECTED Final   Methicillin resistance mecA/C DETECTED (A) NOT DETECTED Final    Comment: CRITICAL RESULT CALLED TO, READ BACK BY AND VERIFIED WITHLloyd Huger PHARMD AT 2255 03/15/2022 DE Performed at [facility], [address] [place], [place] [address]     IMAGING RESULTS:  I have personally reviewed the films ?interstitial pulmonary edema Impression/Recommendation ? 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

## 2022-03-16 NOTE — Assessment & Plan Note (Addendum)
2 out of 2 blood cultures came back positive for methicillin-resistant Staph epidermidis.  Patient has a lot of open wound and will be high risk. Also developed fever at 100.5. -Started on vancomycin -ID consult

## 2022-03-16 NOTE — TOC Progression Note (Signed)
Transition of Care [facility]) - Progression Note    Patient Details  Name: Theresa Warren MRN: [id number] Date of Birth: 1956/04/07  Transition of Care [facility]) CM/SW [place], RN Phone Number: 03/16/2022, 11:43 AM  Clinical Narrative:   RNCM contacted [facility] for potential return.  Tammy at [facility] will call RNCM back re: return.         Expected Discharge Plan and Services                                                 Social Determinants of Health (SDOH) Interventions    Readmission Risk Interventions     No data to display

## 2022-03-16 NOTE — Assessment & Plan Note (Signed)
-   Complicated wound, possible calciphylaxis but biopsy did not prove it. - Patient would benefit from multi disciplinary surgical team available with plastics surgery, reconstructive surgery, and dermatology -Currently refusing a lot of wound care and air mattress - Follow-up with the above outpatient

## 2022-03-16 NOTE — Assessment & Plan Note (Signed)
Most likely with missed dialysis, mental status appears to be at her baseline and she continues to have some hallucination and paranoid thoughts about someone hurting her. She was started on Risperdal by psych Continue to monitor

## 2022-03-16 NOTE — Plan of Care (Signed)

## 2022-03-16 NOTE — Assessment & Plan Note (Signed)
On metoprolol 50 mg twice a day 

## 2022-03-16 NOTE — Assessment & Plan Note (Signed)
Patient continued to have significant hallucination and keeps saying that someone is trying to hurt her.Becoming very tearful at times. -Psych started her on Risperdal

## 2022-03-16 NOTE — Assessment & Plan Note (Signed)
Procalcitonin mildly elevated than the prior check of 1.99, currently at 2.12.  Most likely secondary to staph epi bacteremia. Please see above

## 2022-03-16 NOTE — Progress Notes (Signed)
Patient support person Roselind Rily) is wanting to know how to get patient transferred to [place]. Case manager notified, provider aware. Nightshift also aware.
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
## 2022-03-16 NOTE — Progress Notes (Signed)
PT Cancellation Note  Patient Details Name: Theresa Warren MRN: [id number] DOB: [birth year]   Cancelled Treatment:    Reason Eval/Treat Not Completed: Other (comment).  PT consult received.  Chart reviewed.  Pt's Hgb noted to be down-trending to 6.1 this morning; pt currently pending blood transfusion.  Per therapy guidelines for low Hgb, will hold therapy at this time and re-attempt PT evaluation at a later date/time as medically appropriate.  Leitha Bleak, PT 03/16/22, 12:46 PM

## 2022-03-17 DIAGNOSIS — R400 Somnolence: Secondary | ICD-10-CM | POA: Diagnosis not present

## 2022-03-17 DIAGNOSIS — Z66 Do not resuscitate: Secondary | ICD-10-CM | POA: Diagnosis not present

## 2022-03-17 DIAGNOSIS — R410 Disorientation, unspecified: Secondary | ICD-10-CM | POA: Diagnosis not present

## 2022-03-17 DIAGNOSIS — R4182 Altered mental status, unspecified: Secondary | ICD-10-CM | POA: Diagnosis not present

## 2022-03-17 DIAGNOSIS — R7881 Bacteremia: Secondary | ICD-10-CM | POA: Diagnosis not present

## 2022-03-17 DIAGNOSIS — Z515 Encounter for palliative care: Secondary | ICD-10-CM

## 2022-03-17 DIAGNOSIS — G9340 Encephalopathy, unspecified: Secondary | ICD-10-CM | POA: Diagnosis not present

## 2022-03-17 DIAGNOSIS — N186 End stage renal disease: Secondary | ICD-10-CM | POA: Diagnosis not present

## 2022-03-17 LAB — RENAL FUNCTION PANEL
Albumin: 1.7 g/dL — ABNORMAL LOW (ref 3.5–5.0)
Anion gap: 11 (ref 5–15)
BUN: 30 mg/dL — ABNORMAL HIGH (ref 8–23)
CO2: 25 mmol/L (ref 22–32)
Calcium: 8.6 mg/dL — ABNORMAL LOW (ref 8.9–10.3)
Chloride: 99 mmol/L (ref 98–111)
Creatinine, Ser: 3.86 mg/dL — ABNORMAL HIGH (ref 0.44–1.00)
GFR, Estimated: 12 mL/min — ABNORMAL LOW (ref >60)
Glucose, Bld: 120 mg/dL — ABNORMAL HIGH (ref 70–99)
Phosphorus: 3.9 mg/dL (ref 2.5–4.6)
Potassium: 3.8 mmol/L (ref 3.5–5.1)
Sodium: 135 mmol/L (ref 135–145)

## 2022-03-17 LAB — TYPE AND SCREEN
ABO/RH(D): O POS
Antibody Screen: NEGATIVE
Unit division: 0
Unit division: 0

## 2022-03-17 LAB — BPAM RBC
Blood Product Expiration Date: 202308192359
Blood Product Expiration Date: 202309042359
ISSUE DATE / TIME: 202308141431
ISSUE DATE / TIME: 202308141932
Unit Type and Rh: 5100
Unit Type and Rh: 9500

## 2022-03-17 LAB — GLUCOSE, CAPILLARY
Glucose-Capillary: 64 mg/dL — ABNORMAL LOW (ref 70–99)
Glucose-Capillary: 69 mg/dL — ABNORMAL LOW (ref 70–99)
Glucose-Capillary: 87 mg/dL (ref 70–99)
Glucose-Capillary: 90 mg/dL (ref 70–99)

## 2022-03-17 LAB — VANCOMYCIN, RANDOM: Vancomycin Rm: 32 ug/mL

## 2022-03-17 LAB — HEMOGLOBIN AND HEMATOCRIT, BLOOD
HCT: 30.4 % — ABNORMAL LOW (ref 36.0–46.0)
Hemoglobin: 9.7 g/dL — ABNORMAL LOW (ref 12.0–15.0)

## 2022-03-17 MED ORDER — PENTAFLUOROPROP-TETRAFLUOROETH EX AERO: 1.0000 | INHALATION_SPRAY | CUTANEOUS | Status: DC | PRN

## 2022-03-17 MED ORDER — LIDOCAINE HCL (PF) 1 % IJ SOLN
5.0000 mL | INTRAMUSCULAR | Status: DC | PRN
  Filled 2022-03-17: qty 5

## 2022-03-17 MED ORDER — CHLORHEXIDINE GLUCONATE CLOTH 2 % EX PADS
6.0000 | MEDICATED_PAD | Freq: Every day | CUTANEOUS | Status: DC
Start: 2022-03-17 — End: 2022-03-21
  Administered 2022-03-17 – 2022-03-20 (×4): 6 via TOPICAL

## 2022-03-17 MED ORDER — LIDOCAINE-PRILOCAINE 2.5-2.5 % EX CREA: 1.0000 | TOPICAL_CREAM | CUTANEOUS | Status: DC | PRN

## 2022-03-17 MED ORDER — ANTICOAGULANT SODIUM CITRATE 4% (200MG/5ML) IV SOLN: 5.0000 mL | Status: DC | PRN

## 2022-03-17 MED ORDER — HEPARIN SODIUM (PORCINE) 1000 UNIT/ML DIALYSIS: 1000.0000 [IU] | INTRAMUSCULAR | Status: DC | PRN

## 2022-03-17 MED ORDER — DEXTROSE 50 % IV SOLN: 12.5000 g | Freq: Once | INTRAVENOUS | Status: DC | PRN

## 2022-03-17 MED ORDER — ALTEPLASE 2 MG IJ SOLR: 2.0000 mg | Freq: Once | INTRAMUSCULAR | Status: DC | PRN

## 2022-03-17 NOTE — Plan of Care (Signed)
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
## 2022-03-17 NOTE — Progress Notes (Signed)
Pt refusing HD treatment. Sherlyn Hay, NP present and aware.

## 2022-03-17 NOTE — Assessment & Plan Note (Signed)
2 out of 2 blood cultures came back positive for methicillin-resistant Staph epidermidis.  Patient has a lot of open wound and will be high risk. Also developed fever at 100.5. -Started on vancomycin -She will get vancomycin with dialysis for 1 week per ID.

## 2022-03-17 NOTE — Progress Notes (Signed)
Pharmacy Antibiotic Note  Theresa Warren is a 66 y.o. female admitted on 03/13/2022 with bacteremia.  Past medical history includes ESRD on HD T/Th/Sat. Blood culture resulting 2/2 MRSE. Pharmacy has been consulted for Vancomycin dosing.  Plan: 8/15: Vancomycin dose due today with dialysis but patient refused HD session. Will draw random Vancomycin level to determine if any additional dosing is needed.  Vancomycin 1,000 every T/Th/Sat after HD. (Wt > 80 kg)  Follow HD plans and dose accordingly. Pharmacy will continue to follow and will adjust abx dosing whenever warranted.  Height: 5\' 2"  (157.5 cm) Weight: 93.3 kg (205 lb 11 oz) IBW/kg (Calculated) : 50.1  Temp (24hrs), Avg:98.2 F (36.8 C), Min:97.8 F (36.6 C), Max:98.8 F (37.1 C)   Recent Labs  Lab 03/13/22 1450 03/13/22 1639 03/14/22 0424 03/14/22 2151 03/16/22 0437 03/17/22 0038  WBC 14.6*  --  14.5* 16.8* 11.7*  --   CREATININE 2.95*  --  2.08*  --  3.19* 3.86*  LATICACIDVEN  --  1.0  --   --   --   --      Estimated Creatinine Clearance: 15.7 mL/min (A) (by C-G formula based on SCr of 3.86 mg/dL (H)).    Allergies  Allergen Reactions   Codeine Nausea And Vomiting    Other reaction(s): nausea and vomiting, vomiting   Penicillins Rash and Hives    Other reaction(s): systemic rash   Carvedilol     Other reaction(s): high sugar, headache, cough   Fluoxetine     Other reaction(s): took in 1990 . made her worse.   Liraglutide     Other reaction(s): too nauseated.   Pioglitazone     Other reaction(s): edema and osteoporosis   Sulfa Antibiotics Swelling   Clindamycin Hcl Rash   Clindamycin/Lincomycin Rash   Dilaudid [Hydromorphone Hcl] Rash    Antimicrobials this admission: 8/14 Vancomycin >>   Microbiology results: 8/12 BCx: 2 of 2 bottles w/ Staph epi, mecA/C detected  Thank you for allowing pharmacy to be a part of this patient's care.  Tyshawn Ciullo Rodriguez-Guzman PharmD, BCPS 03/17/2022 9:35  AM
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
## 2022-03-17 NOTE — Progress Notes (Signed)
       CROSS COVER NOTE  NAME: Theresa Warren MRN: [id number] DOB : 04-05-1956    Date of Service   03/17/22  HPI/Events of Note   [name] had 2 hypoglycemic events this afternoon at 1639 and 1814.  Interventions   Plan: D50 Once PRN      This document was prepared using Dragon voice recognition software and may include unintentional dictation errors.  Neomia Glass DNP, MHA, FNP-BC Nurse Practitioner [facility] Pager [phone]

## 2022-03-17 NOTE — TOC Progression Note (Signed)
Transition of Care [facility]) - Progression Note    Patient Details  Name: Theresa Warren MRN: [id number] Date of Birth: 07-Jun-1956  Transition of Care [facility]) CM/SW [place], RN Phone Number: 03/17/2022, 10:46 AM  Clinical Narrative:   Patient refused dialysis today per dialysis team.  Hospitalist made aware, Psychiatric and Palliative Care consults pending at this time.         Expected Discharge Plan and Services                                                 Social Determinants of Health (SDOH) Interventions    Readmission Risk Interventions     No data to display
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
## 2022-03-17 NOTE — Assessment & Plan Note (Signed)
Patient refusing dialysis today. POA wants her to get dialyzed. Renal we will try again tomorrow

## 2022-03-17 NOTE — Assessment & Plan Note (Signed)
Patient appears more lethargic and refusing dialysis today.  Keeps saying that I wanted to go with these people but unable to explain who. Per psych does not have any capacity. She was started on Risperdal by psych Continue to monitor

## 2022-03-17 NOTE — Evaluation (Signed)
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  Clinical Impression  Patient can follow single step commands inconsistently. She is confused and is a poor historian. She was admitted from SNF and is known to therapy services in the hospital from previous admission.  She refused to sit on the edge of bed despite facilitation for LE movement and encouragement to participate, no reason specified.  She can sit up in the bed to long sitting position with minimal assistance for trunk support. Placed the bed in chair to assess upright activity tolerance. She requested to lower the head of bed after about 30 seconds secondary to reported pain (vague report of pain and unable to specify where). She appears to have generalized weakness and limited mobility/activity tolerance. Recommend PT follow up to maximize independence and decreased caregiver burden. SNF is recommended at discharge.    Recommendations for follow up therapy are one component of a multi-disciplinary discharge planning process, led by the attending physician.  Recommendations may be updated based on patient status, additional functional criteria and insurance authorization.  Follow Up Recommendations Skilled nursing-short term rehab (<3 hours/day) Can patient physically be transported by private vehicle: No    Assistance Recommended at Discharge Frequent or constant Supervision/Assistance  Patient can return home with the following   Two people to help with walking and/or transfers;Two people to help with bathing/dressing/bathroom;Help with stairs or ramp for entrance;Assist for transportation;Direct supervision/assist for medications management;Direct supervision/assist for financial management;Assistance with cooking/housework    Equipment Recommendations None recommended by PT  Recommendations for Other Services       Functional Status Assessment Patient has had a recent decline in their functional status and demonstrates the ability to make significant improvements in function in a reasonable and predictable amount of time.     Precautions / Restrictions Precautions Precautions: Fall Restrictions Weight Bearing Restrictions: No      Mobility  Bed Mobility Overal bed mobility: Needs Assistance             General bed mobility comments: patient achieved long sitting position using  bilateral rails with  minimal assistance for trunk support. attempted to facilitate edge of bed seated level activity, however patient refused any movement of BLE despite encouragement to participate. bed placed in chair position to assess upright activity tolerance. she complains of pain (vague without specific pain reported) and requested for the head of bed to be lowered within 30 seconds. distraction does not seem to help with participation    Transfers                        Ambulation/Gait                  Stairs            Wheelchair Mobility    Modified Rankin (Stroke Patients Only)       Balance  Pertinent Vitals/Pain Pain Assessment Pain Assessment: Faces Faces Pain Scale: Hurts little more Pain Location: left side of neck (indicated by pointing) Pain Descriptors / Indicators: Sore Pain Intervention(s): Limited activity within patient's tolerance, Monitored during session    Home Living Family/patient expects to be  discharged to:: Skilled nursing facility                        Prior Function Prior Level of Function : History of Falls (last six months);Needs assist;Patient poor historian/Family not available             Mobility Comments: assistance required with mobility at SNF. per last admission notes, patient was sitting edge of bed with therapy with minimal standing performed ADLs Comments: assistance required by staff at SNF     Hand Dominance        Extremity/Trunk Assessment   Upper Extremity Assessment Upper Extremity Assessment: Generalized weakness    Lower Extremity Assessment Lower Extremity Assessment: Generalized weakness    Cervical / Trunk Assessment Cervical / Trunk Assessment:  (flexed head/neck with rotation to left with mild pain reported at neutral position)  Communication   Communication:  (very low tone when speaking)  Cognition Arousal/Alertness: Lethargic Behavior During Therapy: Flat affect Overall Cognitive Status: No family/caregiver present to determine baseline cognitive functioning Area of Impairment: Orientation, Following commands, Memory, Problem solving, Safety/judgement                 Orientation Level: Disoriented to, Situation, Time, Place   Memory: Decreased short-term memory Following Commands: Follows one step commands inconsistently Safety/Judgement: Decreased awareness of safety, Decreased awareness of deficits   Problem Solving: Slow processing, Decreased initiation, Difficulty sequencing, Requires verbal cues, Requires tactile cues          General Comments      Exercises     Assessment/Plan    PT Assessment Patient needs continued PT services  PT Problem List Decreased strength;Decreased range of motion;Decreased activity tolerance;Decreased balance;Decreased mobility;Decreased cognition;Decreased knowledge of use of DME;Decreased safety awareness;Decreased knowledge of precautions       PT Treatment  Interventions DME instruction;Gait training;Functional mobility training;Therapeutic activities;Therapeutic exercise;Balance training;Neuromuscular re-education;Cognitive remediation;Patient/family education;Wheelchair mobility training    PT Goals (Current goals can be found in the Care Plan section)  Acute Rehab PT Goals Patient Stated Goal: none stated PT Goal Formulation: Patient unable to participate in goal setting Time For Goal Achievement: 03/31/22 Potential to Achieve Goals: Fair    Frequency Min 2X/week     Co-evaluation               AM-PAC PT "6 Clicks" Mobility  Outcome Measure Help needed turning from your back to your side while in a flat bed without using bedrails?: A Little Help needed moving from lying on your back to sitting on the side of a flat bed without using bedrails?: A Lot Help needed moving to and from a bed to a chair (including a wheelchair)?: Total Help needed standing up from a chair using your arms (e.g., wheelchair or bedside chair)?: Total Help needed to walk in hospital room?: Total Help needed climbing 3-5 steps with a railing? : Total 6 Click Score: 9    End of Session   Activity Tolerance:  (self limiting) Patient left: in bed;with call bell/phone within reach;with bed alarm set (Palliative Care NP in the room for part of session) Nurse Communication: Mobility status PT Visit Diagnosis: Muscle weakness (generalized) (M62.81);Unsteadiness on feet (R26.81);Difficulty in  walking, not elsewhere classified (R26.2) Pain - Right/Left: Left Pain - part of body:  (neck)    Time: 3496-1164 PT Time Calculation (min) (ACUTE ONLY): 13 min   Charges:   PT Evaluation $PT Eval Low Complexity: 1 Low          Minna Merritts, PT, MPT  Percell Locus 03/17/2022, 2:13 PM
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
## 2022-03-17 NOTE — Progress Notes (Signed)
Hemodialysis- Patient refusing even after NP discussing with patient. Report called back to floor. Will transport back.

## 2022-03-17 NOTE — TOC Progression Note (Signed)
Transition of Care [facility]) - Progression Note    Patient Details  Name: Theresa Warren MRN: [id number] Date of Birth: 01-09-1956  Transition of Care [facility]) CM/SW Harvey, RN Phone Number: 03/17/2022, 3:50 PM  Clinical Narrative:   Healthcare power of attorney at bedside.  Patient was refusing dialysis today, POA aware.  POA discussed with Mds and care team.  Attempt to dialyze tomorrow.     Expected Discharge Plan and Services                                                 Social Determinants of Health (SDOH) Interventions    Readmission Risk Interventions     No data to display

## 2022-03-18 ENCOUNTER — Inpatient Hospital Stay: Payer: Medicare Other

## 2022-03-18 DIAGNOSIS — R7881 Bacteremia: Secondary | ICD-10-CM | POA: Diagnosis not present

## 2022-03-18 DIAGNOSIS — R4182 Altered mental status, unspecified: Secondary | ICD-10-CM | POA: Diagnosis not present

## 2022-03-18 DIAGNOSIS — F22 Delusional disorders: Secondary | ICD-10-CM | POA: Diagnosis not present

## 2022-03-18 DIAGNOSIS — Z66 Do not resuscitate: Secondary | ICD-10-CM | POA: Diagnosis not present

## 2022-03-18 DIAGNOSIS — D649 Anemia, unspecified: Secondary | ICD-10-CM

## 2022-03-18 DIAGNOSIS — R400 Somnolence: Secondary | ICD-10-CM | POA: Diagnosis not present

## 2022-03-18 DIAGNOSIS — N186 End stage renal disease: Secondary | ICD-10-CM | POA: Diagnosis not present

## 2022-03-18 LAB — CULTURE, BLOOD (ROUTINE X 2)
Culture: NO GROWTH
Special Requests: ADEQUATE
Special Requests: ADEQUATE

## 2022-03-18 LAB — GLUCOSE, CAPILLARY
Glucose-Capillary: 126 mg/dL — ABNORMAL HIGH (ref 70–99)
Glucose-Capillary: 128 mg/dL — ABNORMAL HIGH (ref 70–99)

## 2022-03-18 LAB — HEPATITIS B SURFACE ANTIBODY, QUANTITATIVE: Hep B S AB Quant (Post): 54 m[IU]/mL (ref >9.9)

## 2022-03-18 MED ORDER — HEPARIN SODIUM (PORCINE) 1000 UNIT/ML IJ SOLN
INTRAMUSCULAR | Status: AC
  Filled 2022-03-18: qty 10

## 2022-03-18 NOTE — Inpatient Diabetes Management (Signed)
Inpatient Diabetes Program Recommendations  AACE/ADA: New Consensus Statement on Inpatient Glycemic Control (2015)  Target Ranges:  Prepandial:   less than 140 mg/dL      [facility] postprandial:   less than 180 mg/dL (1-2 hours)      Critically ill patients:  140 - 180 mg/dL   Lab Results  Component Value Date   GLUCAP 126 (H) 03/18/2022   HGBA1C 10.0 (H) 12/18/2021    Latest Reference Range & Units 03/16/22 18:01 03/16/22 21:16 03/17/22 16:39 03/17/22 18:14 03/17/22 19:13 03/17/22 20:43 03/18/22 09:20  Glucose-Capillary 70 - 99 mg/dL 108 (H) 122 (H) 64 (L) 69 (L) 87 90 126 (H)  (H): Data is abnormally high (L): Data is abnormally low  Diabetes history: DM2 Outpatient Diabetes medications: Semglee 15 units q hs, Humalog 3 units tid meal coverage Current orders for Inpatient glycemic control: Semglee 15 units qhs, Novolog 0-6 units tid, 0-5 units hs  Inpatient Diabetes Program Recommendations:   Patient has not received Insulin over the past 24 hrs. Please consider while in the hospital: -D/C Semglee  Thank you, Nani Gasser. Alvin Rubano, RN, MSN, CDE  Diabetes Coordinator Inpatient Glycemic Control Team Team Pager [phone] (8am-5pm) 03/18/2022 10:51 AM

## 2022-03-18 NOTE — Progress Notes (Signed)
   03/18/22 2040  Assess: MEWS Score  Temp 98 F (36.7 C)  BP 121/62  MAP (mmHg) 80  Pulse Rate (!) 117  Resp 19  Level of Consciousness Alert  SpO2 97 %  O2 Device Room Air  Patient Activity (if Appropriate) In bed  Assess: MEWS Score  MEWS Temp 0  MEWS Systolic 0  MEWS Pulse 2  MEWS RR 0  MEWS LOC 0  MEWS Score 2  MEWS Score Color Yellow  Assess: if the MEWS score is Yellow or Red  Were vital signs taken at a resting state? Yes  Focused Assessment No change from prior assessment  Does the patient meet 2 or more of the SIRS criteria? Yes  Does the patient have a confirmed or suspected source of infection? Yes  Provider and Rapid Response Notified? No  MEWS guidelines implemented *See Row Information* Yes  Treat  MEWS Interventions Administered scheduled meds/treatments  Pain Scale 0-10  Pain Score 0  Complains of Anxiety  Neuro symptoms relieved by Rest  Take Vital Signs  Increase Vital Sign Frequency  Yellow: Q 2hr X 2 then Q 4hr X 2, if remains yellow, continue Q 4hrs  Escalate  MEWS: Escalate Yellow: discuss with charge nurse/RN and consider discussing with provider and RRT  Notify: Charge Nurse/RN  Name of Charge Nurse/RN Notified Hana, RN & Butch Penny, RN  Date Charge Nurse/RN Notified 03/18/22  Time Charge Nurse/RN Notified 2050  Notify: Provider  Provider Name/Title Sharion Settler, NP  Date Provider Notified 03/18/22  Time Provider Notified 2050  Method of Notification  (secure chat)  Provider response No new orders  Date of Provider Response 03/18/22  Time of Provider Response 2052  Assess: SIRS CRITERIA  SIRS Temperature  0  SIRS Pulse 1  SIRS Respirations  0  SIRS WBC 1  SIRS Score Sum  2

## 2022-03-18 NOTE — Assessment & Plan Note (Signed)
Patient remained confused with paranoia. Per psych does not have any capacity. She was started on Risperdal by psych Continue to monitor
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
## 2022-03-18 NOTE — Progress Notes (Signed)
Palliative Care Progress Note, Assessment & Plan   Patient Name: Theresa Warren       Date: 03/18/2022 DOB: [birth year]  Age: 66 y.o. MRN#: [id number] Attending Physician: Lorella Nimrod, MD Primary Care Physician: Crist Infante, MD Admit Date: 03/13/2022  Reason for Consultation/Follow-up: Establishing goals of care  Subjective: Patient is sitting up in bed in no apparent distress.  She acknowledges my presence and is able to make her wishes known.  She is tearful but cannot identify why.  She does not want to have her breakfast tray, saying it has a "terrible smell".  No family or friends are at bedside  HPI: 66 y.o. 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   ID was consulted due to 2 out of 2 blood cultures resulting positive for the methicillin-resistant staph epidermidis.   Psychiatry was consulted due to patient's paranoia and need for capacity evaluation.   Palliative medicine team was consulted to discuss goals of care in setting of patient refusing hemodialysis.   Summary of counseling/coordination of care: After reviewing the patient's chart and assessing the patient at bedside, I attempted to speak with patient regards to plan of care.  She shares nonsensical thoughts like "they are pushing me off the ridges" and "everything smells like garlic and I just do not like it".  Patient continues to lack capacity to participate in goals of care complex medical discussions.  Therefore, I spoke with patient's [name] over the phone.  [name] shares she would like for patient to continue with  hemodialysis and possible.  Discussed that this is a short-term goal in hopes of patient gaining some clarity and being able to make her wishes known.  Discussed potential that patient's mentation may not clear and that [name] will be making all medical decisions with or without patient's ability to have input.   Discussed patient's current functional, cognitive, and nutritional status.  Discussed patient's poor p.o. intake.  Reviewed with [name] that if patient's nutritional intake does not increase or meet daily caloric intake/hydration needs, patient will likely be facing end-of-life.  [name] shares she will be prepared to call hospice if patient continues to not want to eat or drink enough.  She says patient made it very clear when she was "of sound mind" that she would never, under any circumstance, want artificial nutrition/hydration/feeding tube.  [name] is hopeful patient will complete dialysis today.  She is not able to visit today.  She plans to bring patient's friend/former boss [name] to hospital for a visit tomorrow in person.  Time for outcomes needed.  DNR remains.  Palliative medicine team will continue to follow the patient and support her throughout this hospitalization.  Code Status: DNR  Prognosis: Unable to determine  Discharge Planning: To Be Determined  Physical Exam Vitals reviewed.  Constitutional:      General: She is not in acute distress.    Appearance: She is obese. She is not ill-appearing.  HENT:     Head: Normocephalic.     Mouth/Throat:  Mouth: Mucous membranes are moist.  Eyes:     Pupils: Pupils are equal, round, and reactive to light.  Cardiovascular:     Rate and Rhythm: Normal rate.     Pulses: Normal pulses.  Pulmonary:     Effort: Pulmonary effort is normal.  Abdominal:     Palpations: Abdomen is soft.  Musculoskeletal:     Comments: Bed bound, wincing in pain with movement of any extremities, does not MAETC  Skin:    General:  Skin is warm and dry.  Neurological:     Mental Status: She is alert.     Comments: Oriented to self             Palliative Assessment/Data: 30%    Total Time 35 minutes  Greater than 50%  of this time was spent counseling and coordinating care related to the above assessment and plan.  Thank you for allowing the Palliative Medicine Team to assist in the care of this patient.  [place] Ilsa Iha, FNP-BC Palliative Medicine Team Team Phone # [phone]

## 2022-03-18 NOTE — Progress Notes (Signed)
Pt refused dressing change at this time. Pt educated on importance of dressing change but continues to refuse.
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
## 2022-03-18 NOTE — Progress Notes (Signed)
   03/18/22 2240  Assess: MEWS Score  Temp 98.1 F (36.7 C)  BP (!) 143/63  MAP (mmHg) 87  Pulse Rate 91  Resp 20  Level of Consciousness Alert  SpO2 95 %  O2 Device Room Air  Assess: MEWS Score  MEWS Temp 0  MEWS Systolic 0  MEWS Pulse 0  MEWS RR 0  MEWS LOC 0  MEWS Score 0  MEWS Score Color Green  Document  Patient Outcome Stabilized after interventions  Progress note created (see row info) Yes  Assess: SIRS CRITERIA  SIRS Temperature  0  SIRS Pulse 1  SIRS Respirations  0  SIRS WBC 1  SIRS Score Sum  2

## 2022-03-18 NOTE — Progress Notes (Signed)
PT Cancellation Note  Patient Details Name: Theresa Warren MRN: [id number] DOB: 11-28-1955   Cancelled Treatment:    Reason Eval/Treat Not Completed: Patient at procedure or test/unavailable. Patient off the floor for dialysis. PT will continue with attempts.   Minna Merritts, PT, MPT  Percell Locus 03/18/2022, 3:26 PM
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
## 2022-03-18 NOTE — Assessment & Plan Note (Signed)
2 out of 2 blood cultures came back positive for methicillin-resistant Staph epidermidis.  Patient has a lot of open wound and will be high risk. Also developed fever at 100.5. -Started on vancomycin -She will get vancomycin with dialysis for 1 week per ID.

## 2022-03-18 NOTE — Assessment & Plan Note (Signed)
Procalcitonin mildly elevated than the prior check of 1.99, currently at 2.12.  Most likely secondary to staph epi bacteremia. Please see above

## 2022-03-18 NOTE — Progress Notes (Signed)
Reduced patient uf goal to 1586ml due to decrease in patient blood pressure. Patient is not complaining of any discomfort or cramping at this time. Will continue to monitor the patient closely.

## 2022-03-18 NOTE — Plan of Care (Signed)

## 2022-03-18 NOTE — Progress Notes (Signed)
Hd tx of 3.5hrs completed. 84L total vol processed. No complications noted. Report given to rodvegas ingram, rn. Total uf removed: 1563ml Post hd v/s: 97.7 101/53(68) 117 15 96%

## 2022-03-18 NOTE — Assessment & Plan Note (Signed)
-   Complicated wound, possible calciphylaxis but biopsy did not prove it.  Labs remain negative for any vasculitis. - Patient would benefit from multi disciplinary surgical team available with plastics surgery, reconstructive surgery, and dermatology -Currently refusing a lot of wound care and air mattress -We are trying to get follow-up appointment at [facility] wound management clinic for further assistance, her facility should be able to transport her for that reason.

## 2022-03-18 NOTE — Assessment & Plan Note (Signed)
Missed dialysis yesterday, she agreed to proceed today. POA wants her to get dialyzed. -Continue with scheduled dialysis.

## 2022-03-18 NOTE — Progress Notes (Signed)
Pharmacy Antibiotic Note  Theresa Warren is a 66 y.o. female admitted on 03/13/2022 with bacteremia.  Past medical history includes ESRD on HD T/Th/Sat. Blood culture resulting 2/2 MRSE. Pharmacy has been consulted for Vancomycin dosing.  Plan: 8/16: Vancomycin random level remains at 32 on 8/15 @ 1721 (32 hrs after loading dose). Appropriate to wait until next HD section on 8/17 to re-dose vancomycin as previously scheduled.  8/15: Vancomycin dose due today with dialysis but patient refused HD session. Will draw random Vancomycin level to determine if any additional dosing is needed.  Vancomycin 1,000 every T/Th/Sat after HD. (Wt > 80 kg)  Follow HD plans and dose accordingly. Pharmacy will continue to follow and will adjust abx dosing whenever warranted.  Height: 5\' 2"  (157.5 cm) Weight: 93.3 kg (205 lb 11 oz) IBW/kg (Calculated) : 50.1  Temp (24hrs), Avg:98.6 F (37 C), Min:98.4 F (36.9 C), Max:98.7 F (37.1 C)   Recent Labs  Lab 03/13/22 1450 03/13/22 1639 03/14/22 0424 03/14/22 2151 03/16/22 0437 03/17/22 0038 03/17/22 1721  WBC 14.6*  --  14.5* 16.8* 11.7*  --   --   CREATININE 2.95*  --  2.08*  --  3.19* 3.86*  --   LATICACIDVEN  --  1.0  --   --   --   --   --   VANCORANDOM  --   --   --   --   --   --  32     Estimated Creatinine Clearance: 15.7 mL/min (A) (by C-G formula based on SCr of 3.86 mg/dL (H)).    Allergies  Allergen Reactions   Codeine Nausea And Vomiting    Other reaction(s): nausea and vomiting, vomiting   Penicillins Rash and Hives    Other reaction(s): systemic rash   Carvedilol     Other reaction(s): high sugar, headache, cough   Fluoxetine     Other reaction(s): took in 1990 . made her worse.   Liraglutide     Other reaction(s): too nauseated.   Pioglitazone     Other reaction(s): edema and osteoporosis   Sulfa Antibiotics Swelling   Clindamycin Hcl Rash   Clindamycin/Lincomycin Rash   Dilaudid [Hydromorphone Hcl] Rash     Antimicrobials this admission: 8/14 Vancomycin >>   Microbiology results: 8/12 BCx: 2 of 2 bottles w/ Staph epi, mecA/C detected  Thank you for allowing pharmacy to be a part of this patient's care.  Serine Kea Rodriguez-Guzman PharmD, BCPS 03/18/2022 8:07 AM

## 2022-03-18 NOTE — Progress Notes (Signed)
Treatment initiated patient was combative, while attempting to hook up ekg wires was able to access her cath. Will continue to attempt treatment with patient and monitor her closely.

## 2022-03-19 ENCOUNTER — Other Ambulatory Visit: Payer: Self-pay

## 2022-03-19 DIAGNOSIS — J9601 Acute respiratory failure with hypoxia: Secondary | ICD-10-CM | POA: Diagnosis not present

## 2022-03-19 DIAGNOSIS — B957 Other staphylococcus as the cause of diseases classified elsewhere: Secondary | ICD-10-CM | POA: Diagnosis not present

## 2022-03-19 DIAGNOSIS — R7881 Bacteremia: Secondary | ICD-10-CM | POA: Diagnosis not present

## 2022-03-19 DIAGNOSIS — R400 Somnolence: Secondary | ICD-10-CM | POA: Diagnosis not present

## 2022-03-19 DIAGNOSIS — G9340 Encephalopathy, unspecified: Secondary | ICD-10-CM | POA: Diagnosis not present

## 2022-03-19 DIAGNOSIS — R4182 Altered mental status, unspecified: Secondary | ICD-10-CM | POA: Diagnosis not present

## 2022-03-19 DIAGNOSIS — Z66 Do not resuscitate: Secondary | ICD-10-CM | POA: Diagnosis not present

## 2022-03-19 LAB — CBC
HCT: 31.5 % — ABNORMAL LOW (ref 36.0–46.0)
Hemoglobin: 10.1 g/dL — ABNORMAL LOW (ref 12.0–15.0)
MCH: 29.1 pg (ref 26.0–34.0)
MCHC: 32.1 g/dL (ref 30.0–36.0)
MCV: 90.8 fL (ref 80.0–100.0)
Platelets: 288 10*3/uL (ref 150–400)
RBC: 3.47 MIL/uL — ABNORMAL LOW (ref 3.87–5.11)
RDW: 18.2 % — ABNORMAL HIGH (ref 11.5–15.5)
WBC: 14.9 10*3/uL — ABNORMAL HIGH (ref 4.0–10.5)
nRBC: 0 % (ref 0.0–0.2)

## 2022-03-19 LAB — RENAL FUNCTION PANEL
Albumin: 1.7 g/dL — ABNORMAL LOW (ref 3.5–5.0)
Anion gap: 10 (ref 5–15)
BUN: 24 mg/dL — ABNORMAL HIGH (ref 8–23)
CO2: 26 mmol/L (ref 22–32)
Calcium: 8.3 mg/dL — ABNORMAL LOW (ref 8.9–10.3)
Chloride: 102 mmol/L (ref 98–111)
Creatinine, Ser: 3.3 mg/dL — ABNORMAL HIGH (ref 0.44–1.00)
GFR, Estimated: 15 mL/min — ABNORMAL LOW (ref >60)
Glucose, Bld: 107 mg/dL — ABNORMAL HIGH (ref 70–99)
Phosphorus: 2.9 mg/dL (ref 2.5–4.6)
Potassium: 3.6 mmol/L (ref 3.5–5.1)
Sodium: 138 mmol/L (ref 135–145)

## 2022-03-19 LAB — GLUCOSE, CAPILLARY
Glucose-Capillary: 198 mg/dL — ABNORMAL HIGH (ref 70–99)
Glucose-Capillary: 78 mg/dL (ref 70–99)
Glucose-Capillary: 118 mg/dL — ABNORMAL HIGH (ref 70–99)

## 2022-03-19 MED ORDER — HALOPERIDOL LACTATE 5 MG/ML IJ SOLN
1.0000 mg | Freq: Four times a day (QID) | INTRAMUSCULAR | Status: DC | PRN
Start: 2022-03-19 — End: 2022-03-21

## 2022-03-19 NOTE — Progress Notes (Signed)
TX terminated early due to safety issues, patient is pulling at perm cath and becoming combative towards staff. NP C.Breeze approved early termination of treatment.

## 2022-03-19 NOTE — Plan of Care (Signed)
Problem: Education: Goal: Ability to describe self-care measures that may prevent or decrease complications (Diabetes Survival Skills Education) will improve Outcome: Not Progressing Goal: Individualized Educational Video(s) Outcome: Not Progressing   Problem: Coping: Goal: Ability to adjust to condition or change in health will improve Outcome: Not Progressing   Problem: Fluid Volume: Goal: Ability to maintain a balanced intake and output will improve Outcome: Not Progressing   Problem: Health Behavior/Discharge Planning: Goal: Ability to identify and utilize available resources and services will improve Outcome: Not Progressing Goal: Ability to manage health-related needs will improve Outcome: Not Progressing   Problem: Metabolic: Goal: Ability to maintain appropriate glucose levels will improve Outcome: Not Progressing   Problem: Nutritional: Goal: Maintenance of adequate nutrition will improve Outcome: Not Progressing Goal: Progress toward achieving an optimal weight will improve Outcome: Not Progressing   Problem: Skin Integrity: Goal: Risk for impaired skin integrity will decrease Outcome: Not Progressing   Problem: Tissue Perfusion: Goal: Adequacy of tissue perfusion will improve Outcome: Not Progressing   Problem: Education: Goal: Knowledge of General Education information will improve Description: Including pain rating scale, medication(s)/side effects and non-pharmacologic comfort measures Outcome: Not Progressing   Problem: Health Behavior/Discharge Planning: Goal: Ability to manage health-related needs will improve Outcome: Not Progressing   Problem: Clinical Measurements: Goal: Ability to maintain clinical measurements within normal limits will improve Outcome: Not Progressing Goal: Will remain free from infection Outcome: Not Progressing Goal: Diagnostic test results will improve Outcome: Not Progressing Goal: Respiratory complications will  improve Outcome: Not Progressing Goal: Cardiovascular complication will be avoided Outcome: Not Progressing   Problem: Activity: Goal: Risk for activity intolerance will decrease Outcome: Not Progressing   Problem: Nutrition: Goal: Adequate nutrition will be maintained Outcome: Not Progressing   Problem: Coping: Goal: Level of anxiety will decrease Outcome: Not Progressing   Problem: Elimination: Goal: Will not experience complications related to bowel motility Outcome: Not Progressing Goal: Will not experience complications related to urinary retention Outcome: Not Progressing   Problem: Pain Managment: Goal: General experience of comfort will improve Outcome: Not Progressing   Problem: Safety: Goal: Ability to remain free from injury will improve Outcome: Not Progressing   Problem: Skin Integrity: Goal: Risk for impaired skin integrity will decrease Outcome: Not Progressing   Problem: Education: Goal: Knowledge of General Education information will improve Description: Including pain rating scale, medication(s)/side effects and non-pharmacologic comfort measures Outcome: Not Progressing   Problem: Health Behavior/Discharge Planning: Goal: Ability to manage health-related needs will improve Outcome: Not Progressing   Problem: Clinical Measurements: Goal: Ability to maintain clinical measurements within normal limits will improve Outcome: Not Progressing Goal: Will remain free from infection Outcome: Not Progressing Goal: Diagnostic test results will improve Outcome: Not Progressing Goal: Respiratory complications will improve Outcome: Not Progressing Goal: Cardiovascular complication will be avoided Outcome: Not Progressing   Problem: Activity: Goal: Risk for activity intolerance will decrease Outcome: Not Progressing   Problem: Nutrition: Goal: Adequate nutrition will be maintained Outcome: Not Progressing   Problem: Coping: Goal: Level of anxiety  will decrease Outcome: Not Progressing   Problem: Elimination: Goal: Will not experience complications related to bowel motility Outcome: Not Progressing Goal: Will not experience complications related to urinary retention Outcome: Not Progressing   Problem: Pain Managment: Goal: General experience of comfort will improve Outcome: Not Progressing   Problem: Safety: Goal: Ability to remain free from injury will improve Outcome: Not Progressing   Problem: Skin Integrity: Goal: Risk for impaired skin integrity will decrease Outcome: Not Progressing

## 2022-03-19 NOTE — Assessment & Plan Note (Signed)
Patient intermittently remained confused, at time answer appropriately but continued to have hallucinations and paranoid thoughts. Per psych does not have any capacity. She was started on Risperdal by psych Continue to monitor
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
## 2022-03-19 NOTE — Progress Notes (Signed)
Patient refused blood sugar to be checked.  Dr. Reesa Chew is aware.

## 2022-03-19 NOTE — Assessment & Plan Note (Signed)
Procalcitonin mildly elevated than the prior check of 1.99, currently at 2.12.  Most likely secondary to staph epi bacteremia. Please see above

## 2022-03-19 NOTE — Assessment & Plan Note (Signed)
2 / 4 blood cultures came back positive for methicillin-resistant Staph epidermidis, most likely contaminant but patient has a lot of open wound and will be high risk. Also developed fever at 100.5. -Started on vancomycin -She will get vancomycin with dialysis for 1 week per ID.

## 2022-03-19 NOTE — Progress Notes (Signed)
PT Cancellation Note  Patient Details Name: Theresa Warren MRN: [id number] DOB: 1956/06/19   Cancelled Treatment:     PT attempt. Pt currently not appropriate to participate. RN entered room and offer drink and medications. Pt refused. She did take small sip of drink but " taste like poison." PT will return after palliative care conversation with POA/friend to see how treatment plan will proceed.    Theresa Warren 03/19/2022, 12:56 PM

## 2022-03-19 NOTE — Progress Notes (Signed)
Hd tx of 2.22hrs completed. 68L total vol processed. Early tx termination due to pt agitation NP Breeze aware. Report given to berna mirafuente, rn.  Total uf removed: 1554ml Post hd v/s: 98.8 98/59(66) 118 14 97% Post hd wt: 92.6kgs

## 2022-03-19 NOTE — Progress Notes (Signed)
   03/19/22 1126  Assess: MEWS Score  Temp 98.6 F (37 C)  BP 130/66  MAP (mmHg) 86  Pulse Rate (!) 112  Resp 16  Level of Consciousness Alert  SpO2 96 %  O2 Device Room Air  Patient Activity (if Appropriate) In bed  Assess: MEWS Score  MEWS Temp 0  MEWS Systolic 0  MEWS Pulse 2  MEWS RR 0  MEWS LOC 0  MEWS Score 2  MEWS Score Color Yellow  Assess: if the MEWS score is Yellow or Red  Were vital signs taken at a resting state? Yes  Focused Assessment No change from prior assessment  Does the patient meet 2 or more of the SIRS criteria? No  Does the patient have a confirmed or suspected source of infection? No  Provider and Rapid Response Notified? Yes (provider only)  MEWS guidelines implemented *See Row Information* No, other (Comment)  Treat  MEWS Interventions Administered scheduled meds/treatments  Pain Scale 0-10  Pain Score 0  Take Vital Signs  Increase Vital Sign Frequency   (Not applicable)  Escalate  MEWS: Escalate  (Not applicable)  Notify: Charge Nurse/RN  Name of Charge Nurse/RN Notified Angela RN  Date Charge Nurse/RN Notified 03/19/22  Time Charge Nurse/RN Notified 1134  Notify: Provider  Provider Name/Title Dr. Cooper Render  Date Provider Notified 03/19/22  Time Provider Notified 1134  Method of Notification Call  Notification Reason Other (Comment) (yellow MEWS)  Provider response In department  Date of Provider Response 03/19/22  Time of Provider Response 1134  Document  Patient Outcome Other (Comment) (Patient is stable.  No change in status.)  Progress note created (see row info) Yes  Assess: SIRS CRITERIA  SIRS Temperature  0  SIRS Pulse 1  SIRS Respirations  0  SIRS WBC 1  SIRS Score Sum  2   Patient has been previously Yellow MEWS within the last 24 hours.  Patient is stable, no change in status.  Dr. Reesa Chew is aware.

## 2022-03-19 NOTE — Progress Notes (Addendum)
                                                   Palliative Care Progress Note, Assessment & Plan   Patient Name: Theresa Warren       Date: 03/19/2022 DOB: 04/22/1956  Age: 66 y.o. MRN#: [id number] Attending Physician: Amin, Sumayya, MD Primary Care Physician: Perini, Mark, MD Admit Date: 03/13/2022  Reason for Consultation/Follow-up: Establishing goals of care  Subjective: Patient is lying in bed in dialysis unit.  She is moaning and groaning.  She is fidgeting with her pulse ox monitor but too weak to be able to pull it off.  She is crying and overall just appears miserable.  HPI: 66 y.o. 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   ID was consulted due to 2 out of 2 blood cultures resulting positive for the methicillin-resistant staph epidermidis.   Psychiatry was consulted due to patient's paranoia and need for capacity evaluation.   Palliative medicine team was consulted to discuss goals of care in setting of patient refusing hemodialysis.   Summary of counseling/coordination of care: After reviewing the patient's chart and assessing the patient in dialysis, I spoke with patient's dialysis nurse tech Tessa, who has been with [name] is previous HD treatments in the past.  Tessa says patient has not had any significant change in mentation for the last 2 dialysis sessions.  This is different from previous sessions when patient will have some mental clarity and orientation to situation towards the end of dialysis sessions.  HD session was terminated early d/t patient's combativeness and wanting to pull out permcath.  HCPOA Susan and  patient's close friend Nancy Franklin plan to visit with patient today.  I have asked nurse to notify me when visitors are at bedside and patient has returned from dialysis in order to continue goals of care discussions.  Addendum:  I met with patient's HCPOA Susan) Nancy along with patient at bedside today.  Patient verbalized she does not trust anyone at [facility] and would [facility].  She wants to continue hemodialysis but not in the hospital.  Patient without participate in further discussions.  Susan is in agreement and asked how quickly patient could be transferred back to [facility].  DNR remains.  Plan is for patient to discharge soon as possible at [facility] and continue outpatient dialysis therapy.  PMT will shadow the chart and reengage at patient/family's request, if goals change, or if patient's health status declines.  Attending made aware of patient's wishes.  Code Status: DNR  Prognosis: Unable to determine  Discharge Planning: To Be Determined  Physical Exam Vitals reviewed.  Constitutional:      General: She is not in acute distress.    Appearance: She is ill-appearing.  HENT:     Head: Normocephalic.  Eyes:     Pupils: Pupils are equal, round, and reactive to light.  Cardiovascular:     Rate and Rhythm: Tachycardia present.  Pulmonary:     Effort: Pulmonary effort is normal.  Abdominal:     Palpations: Abdomen is soft.  Musculoskeletal:     Comments: Generalized   weakness  Skin:    General: Skin is warm.     Comments: UTA sacral wounds  Neurological:     Mental Status: She is alert.             Palliative Assessment/Data: 20%    Total Time 30 minutes  Greater than 50%  of this time was spent counseling and coordinating care related to the above assessment and plan.  Thank you for allowing the Palliative Medicine Team to assist in the care of this patient.   L. , DNP, FNP-BC Palliative  Medicine Team Team Phone # [phone]   

## 2022-03-19 NOTE — Progress Notes (Signed)
   03/19/22 1415  Clinical Encounter Type  Visited With Patient and family together  Visit Type Initial  Referral From Nurse  Consult/Referral To Chaplain   Chaplain responded to call to help complete HCPOA and Living Will Documents. Documents uploaded to ACP and placed in patients physical chart.

## 2022-03-19 NOTE — Progress Notes (Signed)
Patient seen by Dr. Reesa Chew.  MD asked this RN to give patient something to drink which was offered earlier and was refused by patient.  Tried again this time, PT in the room with the patient.  Patient tried taking a sip and said "This tastes like poison...".  Patient informed that it is [place].  Patient refused the rest of the drink.
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
## 2022-03-19 NOTE — Progress Notes (Signed)
Initial Nutrition Assessment  DOCUMENTATION CODES:   Obesity unspecified  INTERVENTION:   -If pt continues to refuse PO's and medications, may need to consider enteral nutrition support if this is within pt's goals of care. Recommend:  Initiate Pivot 1.5 @ 25 ml/hr and increase by 10 ml every 4 hours to goal rate of 55 ml/hr.   Tube feeding regimen provides 1980 kcal (100% of needs), 124 grams of protein, and 990 ml of H2O.    NUTRITION DIAGNOSIS:   Increased nutrient needs related to wound healing as evidenced by estimated needs.  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  Patient presents with mental status changes and shortness of breath.  Pt admitted with AMS.   Reviewed I/O's: -1.5 L x 24 hours and -7.4 L since admission   Pt very familiar to this RD from multiple previous admissions. Pt has multiple wounds; per MD notes, work-up negative for calciphylaxis.   Pt sitting up in bed at time of visit. She is refusing medications from nurse and nurse tech. RD spoke with pt and tried to convince pt to take medications; pt refused water, juice, applesauce, and alternative ice cream flavor. Pt continued to refuse offer of all food and supplements.   Case discussed with RN, who reports mental status is more altered compared to yesterday. Pt unable to complete full HD treatment today. There is question to see if pt has the capacity of make decisions. Palliative care following for goals of care; family friend is supposed to visit pt today to help make further decisions based upon goals of care. RN confirms that pt is refusing most care, including wound care, PO's, and medications.     Reviewed wt hx; pt has experienced a 7.6% wt loss over the past month, which is significant for time frame.   Lab Results  Component Value Date   HGBA1C 10.0 (H) 12/18/2021   PTA DM medications are 3 units insulin lispro QID and 15 units insulin glargine-yfgn daily.   Labs reviewed: CBGS: 126-198 (inpatient orders for glycemic control are 0-5 units insulin aspart daily at bedtime, 0-6 units insulin aspart TID with meals, and 15 units insulin glargine-yfgn daily at bedtime).    Diet Order:   Diet Order             Diet renal/carb modified with fluid restriction Diet-HS Snack? Nothing; Fluid restriction: 1200 mL Fluid; Room service appropriate? Yes; Fluid consistency: Thin  Diet effective now                   EDUCATION NEEDS:   Not appropriate for education at this time  Skin:  Skin Assessment: Skin Integrity Issues: Skin Integrity Issues:: Other (Comment), Unstageable DTI: - Stage II: - Stage III: - Stage IV: - Unstageable: lt buttocks, rt buttocks, lt hip, rt hip, lt upper thigh, lt posterior thigh, rt lateral thigh, sacrum Wound Vac: - Other: non-pressure wound of lt thigh  Last BM:  Unknown  Height:   Ht Readings from Last 1 Encounters:  03/14/22 5\' 2"  (1.575 m)    Weight:   Wt Readings from Last 1 Encounters:  03/19/22 93.2 kg    Ideal Body Weight:  50  kg  BMI:  Body mass index is 37.58 kg/m.  Estimated Nutritional Needs:   Kcal:  1800-2000  Protein:  110-125 grams  Fluid:  1000 ml + UOP    Loistine Chance, RD, LDN, [place] Registered Dietitian II Certified Diabetes Care and Education Specialist Please refer to [facility] for RD and/or RD on-call/weekend/after hours pager
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
## 2022-03-19 NOTE — Progress Notes (Addendum)
Attempted giving patient meds - metoprolol and risperdal but patient still refused.  Patient said "no, no, I am not taking those, just leave me alone".  Offered drink, patient refused as well.
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
## 2022-03-19 NOTE — Progress Notes (Signed)
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    PT Comments    Pt was long sitting in bed upon arriving but very lethargic. Discussed wanting to return to [facility] at DC and pt agrees to trial sitting up EOB and attempting OOB activity. Pt required extensive assistance to achieve EOB short sit and is unable to tolerate prolonged sitting due to c/o neck pain. Max assist to return to supine form EOB sitting. She did participate in some LE strengthening when back into bed however due to lethargy, quickly falls asleep. Highly recommend DC to SNF to address deficits while maximizing independence with ADLs.    Recommendations for follow up therapy are one component of a multi-disciplinary discharge planning process, led by the attending physician.  Recommendations may be updated based on patient status, additional functional criteria and insurance authorization.  Follow Up Recommendations  Skilled nursing-short term rehab (<3 hours/day) Can patient physically be transported by private vehicle: No   Assistance Recommended at Discharge Frequent or constant Supervision/Assistance  Patient can return home with the following Two people to help with walking and/or transfers;Two people to help with bathing/dressing/bathroom;Help with stairs or ramp for entrance;Assist for transportation;Direct supervision/assist for medications management;Direct supervision/assist for financial management;Assistance with  cooking/housework   Equipment Recommendations  None recommended by PT       Precautions / Restrictions Precautions Precautions: Fall Precaution Comments: sacral & leg wounds Restrictions Weight Bearing Restrictions: No     Mobility  Bed Mobility Overal bed mobility: Needs Assistance Bed Mobility: Rolling, Sidelying to Sit, Supine to Sit, Sit to Supine Rolling: Max assist Sidelying to sit: Max assist Supine to sit: Max assist Sit to supine: Max assist   General bed mobility comments: pt was able to progress form supine to short sit EOB with max vcs + max encouragement. pt unable to tolerate sitting EOB long. She required max assist to return to supine from EOB short sit.    Transfers  General transfer comment: pt unwilling to try/ struggles to tolerate static sitting EOB due to pain     Balance Overall balance assessment: Needs assistance, History of Falls Sitting-balance support: Feet supported, Bilateral upper extremity supported Sitting balance-Leahy Scale: Fair    Standing balance comment: did not assess    Cognition Arousal/Alertness: Lethargic Behavior During Therapy: Flat affect, Agitated Overall Cognitive Status: No family/caregiver present to determine baseline cognitive functioning      Following Commands: Follows one step commands inconsistently Safety/Judgement: Decreased awareness of safety, Decreased awareness of deficits Awareness: Intellectual Problem Solving: Slow processing, Decreased initiation, Difficulty sequencing, Requires verbal cues, Requires tactile cues General Comments: Pt is very lethargic but gets agitated with author when pushed/ motivated/encouarged to do more        Exercises Total Joint Exercises Ankle Circles/Pumps: AROM, Strengthening, Both, 10 reps Quad Sets: AROM, 10 reps Hip ABduction/ADduction: AAROM, 10 reps Straight Leg Raises: AAROM, 5 reps        Pertinent Vitals/Pain Pain Assessment Pain Assessment: 0-10 Pain  Score: 8  Pain Location: neck Pain Intervention(s): Limited activity within patient's tolerance, Monitored during session, Repositioned     PT  Goals (current goals can now be found in the care plan section) Acute Rehab PT Goals Patient Stated Goal: return to [facility] Progress towards PT goals: Progressing toward goals    Frequency    Min 2X/week      PT Plan Current plan remains appropriate    Co-evaluation     PT goals addressed during session: Mobility/safety with mobility;Balance;Proper use of DME;Strengthening/ROM        AM-PAC PT "6 Clicks" Mobility   Outcome Measure  Help needed turning from your back to your side while in a flat bed without using bedrails?: A Little Help needed moving from lying on your back to sitting on the side of a flat bed without using bedrails?: A Lot Help needed moving to and from a bed to a chair (including a wheelchair)?: Total Help needed standing up from a chair using your arms (e.g., wheelchair or bedside chair)?: Total Help needed to walk in hospital room?: Total Help needed climbing 3-5 steps with a railing? : Total 6 Click Score: 9    End of Session   Activity Tolerance: Patient tolerated treatment well;Patient limited by lethargy;Treatment limited secondary to agitation Patient left: in bed;with call bell/phone within reach;with bed alarm set Nurse Communication: Mobility status PT Visit Diagnosis: Muscle weakness (generalized) (M62.81);Unsteadiness on feet (R26.81);Difficulty in walking, not elsewhere classified (R26.2) Pain - Right/Left: Left Pain - part of body: Leg     Time: 1216-2446 PT Time Calculation (min) (ACUTE ONLY): 9 min  Charges:  $Therapeutic Activity: 8-22 mins                    Julaine Fusi PTA 03/19/22, 4:56 PM

## 2022-03-19 NOTE — Progress Notes (Signed)
PT Cancellation Note  Patient Details Name: Theresa Warren MRN: [id number] DOB: August 14, 1955   Cancelled Treatment:     Per chart, pt is off floor in HD. Acute PT will continue to follow and progress as able per current POC. Will attempt to see later this afternoon if pt is appropriate to participate.    Willette Pa 03/19/2022, 9:01 AM

## 2022-03-20 DIAGNOSIS — N186 End stage renal disease: Secondary | ICD-10-CM | POA: Diagnosis not present

## 2022-03-20 DIAGNOSIS — R7881 Bacteremia: Secondary | ICD-10-CM | POA: Diagnosis not present

## 2022-03-20 DIAGNOSIS — D72828 Other elevated white blood cell count: Secondary | ICD-10-CM

## 2022-03-20 DIAGNOSIS — S31809A Unspecified open wound of unspecified buttock, initial encounter: Secondary | ICD-10-CM

## 2022-03-20 DIAGNOSIS — J9601 Acute respiratory failure with hypoxia: Secondary | ICD-10-CM | POA: Diagnosis not present

## 2022-03-20 DIAGNOSIS — N181 Chronic kidney disease, stage 1: Secondary | ICD-10-CM

## 2022-03-20 DIAGNOSIS — B957 Other staphylococcus as the cause of diseases classified elsewhere: Secondary | ICD-10-CM | POA: Diagnosis not present

## 2022-03-20 DIAGNOSIS — I10 Essential (primary) hypertension: Secondary | ICD-10-CM | POA: Diagnosis not present

## 2022-03-20 DIAGNOSIS — G9340 Encephalopathy, unspecified: Secondary | ICD-10-CM | POA: Diagnosis not present

## 2022-03-20 LAB — COMPREHENSIVE METABOLIC PANEL
ALT: 30 U/L (ref 0–44)
AST: 74 U/L — ABNORMAL HIGH (ref 15–41)
Albumin: 1.8 g/dL — ABNORMAL LOW (ref 3.5–5.0)
Alkaline Phosphatase: 369 U/L — ABNORMAL HIGH (ref 38–126)
Anion gap: 9 (ref 5–15)
BUN: 18 mg/dL (ref 8–23)
CO2: 27 mmol/L (ref 22–32)
Calcium: 8.5 mg/dL — ABNORMAL LOW (ref 8.9–10.3)
Chloride: 101 mmol/L (ref 98–111)
Creatinine, Ser: 2.9 mg/dL — ABNORMAL HIGH (ref 0.44–1.00)
GFR, Estimated: 18 mL/min — ABNORMAL LOW (ref >60)
Glucose, Bld: 63 mg/dL — ABNORMAL LOW (ref 70–99)
Potassium: 3.4 mmol/L — ABNORMAL LOW (ref 3.5–5.1)
Sodium: 137 mmol/L (ref 135–145)
Total Bilirubin: 0.9 mg/dL (ref 0.3–1.2)
Total Protein: 6 g/dL — ABNORMAL LOW (ref 6.5–8.1)

## 2022-03-20 LAB — CBC WITH DIFFERENTIAL/PLATELET
Abs Immature Granulocytes: 0.15 10*3/uL — ABNORMAL HIGH (ref 0.00–0.07)
Basophils Absolute: 0 10*3/uL (ref 0.0–0.1)
Basophils Relative: 0 %
Eosinophils Absolute: 0 10*3/uL (ref 0.0–0.5)
Eosinophils Relative: 0 %
HCT: 32.6 % — ABNORMAL LOW (ref 36.0–46.0)
Hemoglobin: 10.3 g/dL — ABNORMAL LOW (ref 12.0–15.0)
Immature Granulocytes: 1 %
Lymphocytes Relative: 8 %
Lymphs Abs: 1.2 10*3/uL (ref 0.7–4.0)
MCH: 28.6 pg (ref 26.0–34.0)
MCHC: 31.6 g/dL (ref 30.0–36.0)
MCV: 90.6 fL (ref 80.0–100.0)
Monocytes Absolute: 0.6 10*3/uL (ref 0.1–1.0)
Monocytes Relative: 4 %
Neutro Abs: 12.2 10*3/uL — ABNORMAL HIGH (ref 1.7–7.7)
Neutrophils Relative %: 87 %
Platelets: 290 10*3/uL (ref 150–400)
RBC: 3.6 MIL/uL — ABNORMAL LOW (ref 3.87–5.11)
RDW: 17.9 % — ABNORMAL HIGH (ref 11.5–15.5)
WBC: 14.2 10*3/uL — ABNORMAL HIGH (ref 4.0–10.5)
nRBC: 0 % (ref 0.0–0.2)

## 2022-03-20 LAB — PHOSPHORUS
Phosphorus: 2.5 mg/dL (ref 2.5–4.6)
Phosphorus: 2.6 mg/dL (ref 2.5–4.6)

## 2022-03-20 LAB — GLUCOSE, CAPILLARY
Glucose-Capillary: 64 mg/dL — ABNORMAL LOW (ref 70–99)
Glucose-Capillary: 84 mg/dL (ref 70–99)
Glucose-Capillary: 172 mg/dL — ABNORMAL HIGH (ref 70–99)
Glucose-Capillary: 143 mg/dL — ABNORMAL HIGH (ref 70–99)
Glucose-Capillary: 165 mg/dL — ABNORMAL HIGH (ref 70–99)

## 2022-03-20 LAB — MAGNESIUM: Magnesium: 1.7 mg/dL (ref 1.7–2.4)

## 2022-03-20 MED ORDER — VANCOMYCIN HCL IN DEXTROSE 1-5 GM/200ML-% IV SOLN
1000.0000 mg | Freq: Once | INTRAVENOUS | Status: AC
Start: 2022-03-20 — End: 2022-03-20
  Administered 2022-03-20: 1000 mg via INTRAVENOUS
  Filled 2022-03-20: qty 200

## 2022-03-20 NOTE — Progress Notes (Signed)
PT Cancellation Note  Patient Details Name: Theresa Warren MRN: [id number] DOB: 18-Jun-1956   Cancelled Treatment:     PT attempt. Pt refused. Will try again later and continue to follow and progress as able per current POC.    Willette Pa 03/20/2022, 10:47 AM

## 2022-03-20 NOTE — TOC Progression Note (Signed)
Transition of Care [facility]) - Progression Note    Patient Details  Name: Vonceil Upshur MRN: [id number] Date of Birth: [birth year]  Transition of Care [facility]) CM/SW [place] C[place], Story Phone Number: 03/20/2022, 4:39 PM  Clinical Narrative:      Josem Kaufmann received for patient ot reutrn to [facility]. Discussed case with MD and Palliative NP. CSW spoke with patient's friend and decision maker [name] who confirmed she is aware patient needs specialty follow up care to improve, per ID based on her intensive wounds. [name] reports patient has a follow up apt at [facility]. [name] requests patient get back to [facility] as soon as possible.   Per MD plan to discharge 8/19 to [facility], Tammy at [facility] made aware.       Expected Discharge Plan and Services                                                 Social Determinants of Health (SDOH) Interventions    Readmission Risk Interventions     No data to display

## 2022-03-20 NOTE — Progress Notes (Signed)
22g PIV placed in Right wrist. First attempt. Tolerated well. Documented in [facility]

## 2022-03-20 NOTE — Care Management Important Message (Signed)
Important Message  Patient Details  Name: Theresa Warren MRN: [id number] Date of Birth: 1956-02-23   Medicare Important Message Given:  Yes  I reviewed the Important Message from Medicare with the patient and HCPOA, [name] that was visiting in the room.     [name] 03/20/2022, 3:08 PM
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
## 2022-03-20 NOTE — Inpatient Diabetes Management (Signed)
Inpatient Diabetes Program Recommendations  AACE/ADA: New Consensus Statement on Inpatient Glycemic Control   Target Ranges:  Prepandial:   less than 140 mg/dL      Peak postprandial:   less than 180 mg/dL (1-2 hours)      Critically ill patients:  140 - 180 mg/dL    Latest Reference Range & Units 03/19/22 07:28 03/19/22 15:36 03/19/22 22:16 03/20/22 08:18  Glucose-Capillary 70 - 99 mg/dL 198 (H) 78 118 (H) 64 (L)   Review of Glycemic Control  Diabetes history: DM2 Outpatient Diabetes medications: Semglee 15 units QHS, Humalog 3 units TID with meals Current orders for Inpatient glycemic control: Semglee 15 units QHS, Novolog 0-6 units TID with meals, Novolog 0-5 units QHS  Inpatient Diabetes Program Recommendations:    Insulin: Fasting glucose 64 mg/dl today. Please consider decreasing Semglee to 12 units QHS.  Thanks, Barnie Alderman, RN, MSN, [facility] Diabetes Coordinator Inpatient Diabetes Program [phone] (Team Pager from 8am to [place])

## 2022-03-20 NOTE — Consult Note (Signed)
[facility] Nurse Consult Note: Patient receiving care in [facility]. Reason for Consult: "Per ID wound needs to be reassessed and treatment adjusted bc wound now more eschar instead of slough" Upon my entry into the room the patient was sitting up in bed watching TV. I introduced myself and explained why I was there. She appeared to drift off to sleep several times during my encounter with her.  I was able to complete my conversation with her and asked if I could see her wounds.  She responded she did not want me to look at them.  I asked if she was happy with the wound care she had been receiving and she responded "yes".  I reviewed the images from today.  In calciphylaxis, a dry and stable eschar is preferable to a wet wound with slough. One benefit of the dry stable eschar is there is less growth medium for microbes.  The fact that there is more eschar could be a benefit to the patient.  The ordered topical treatment remains appropriate.  If there is any concern about this approach, please feel free to reach out to surgical services for their input.  [place] nurse will not follow at this time.  Please re-consult the [place] team if needed.  Val Riles, RN, MSN, CWOCN, CNS-BC, pager [phone]
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
## 2022-03-20 NOTE — Progress Notes (Signed)
Pharmacy Antibiotic Note  Theresa Warren is a 66 y.o. female admitted on 03/13/2022 with bacteremia.  Past medical history includes ESRD on HD T/Th/Sat. Blood culture resulting 2/2 MRSE. Pharmacy has been consulted for Vancomycin dosing.  Plan: 8/18: Vancomycin dose not given yesterday either. RN contacted to given Vancomycin 1gm today. Per ID physician , therapy end date 8/20  8/16: Vancomycin random level remains at 32 on 8/15 @ 1721 (32 hrs after loading dose). Appropriate to wait until next HD section on 8/17 to re-dose vancomycin as previously scheduled.  8/15: Vancomycin dose due today with dialysis but patient refused HD session. Will draw random Vancomycin level to determine if any additional dosing is needed.  Vancomycin 1,000 every T/Th/Sat after HD. (Wt > 80 kg)  Follow HD plans and dose accordingly. Pharmacy will continue to follow and will adjust abx dosing whenever warranted.  Height: 5\' 2"  (157.5 cm) Weight: 93.2 kg (205 lb 7.5 oz) IBW/kg (Calculated) : 50.1  Temp (24hrs), Avg:98.4 F (36.9 C), Min:97.5 F (36.4 C), Max:98.9 F (37.2 C)   Recent Labs  Lab 03/13/22 1639 03/14/22 0424 03/14/22 2151 03/16/22 0437 03/17/22 0038 03/17/22 1721 03/19/22 0416 03/20/22 0728  WBC  --  14.5* 16.8* 11.7*  --   --  14.9* 14.2*  CREATININE  --  2.08*  --  3.19* 3.86*  --  3.30* 2.90*  LATICACIDVEN 1.0  --   --   --   --   --   --   --   VANCORANDOM  --   --   --   --   --  32  --   --      Estimated Creatinine Clearance: 20.8 mL/min (A) (by C-G formula based on SCr of 2.9 mg/dL (H)).    Allergies  Allergen Reactions   Codeine Nausea And Vomiting    Other reaction(s): nausea and vomiting, vomiting   Penicillins Rash and Hives    Other reaction(s): systemic rash   Carvedilol     Other reaction(s): high sugar, headache, cough   Fluoxetine     Other reaction(s): took in 1990 . made her worse.   Liraglutide     Other reaction(s): too nauseated.   Pioglitazone      Other reaction(s): edema and osteoporosis   Sulfa Antibiotics Swelling   Clindamycin Hcl Rash   Clindamycin/Lincomycin Rash   Dilaudid [Hydromorphone Hcl] Rash    Antimicrobials this admission: 8/14 Vancomycin >>   Microbiology results: 8/12 BCx: 2 of 2 bottles w/ Staph epi, mecA/C detected  Thank you for allowing pharmacy to be a part of this patient's care.  Gali Spinney Rodriguez-Guzman PharmD, BCPS 03/20/2022 9:42 AM
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
## 2022-03-21 DIAGNOSIS — N186 End stage renal disease: Secondary | ICD-10-CM | POA: Diagnosis not present

## 2022-03-21 DIAGNOSIS — R7881 Bacteremia: Secondary | ICD-10-CM | POA: Diagnosis not present

## 2022-03-21 DIAGNOSIS — R4182 Altered mental status, unspecified: Secondary | ICD-10-CM | POA: Diagnosis not present

## 2022-03-21 DIAGNOSIS — J9601 Acute respiratory failure with hypoxia: Secondary | ICD-10-CM | POA: Diagnosis not present

## 2022-03-21 LAB — COMPREHENSIVE METABOLIC PANEL
ALT: 32 U/L (ref 0–44)
AST: 63 U/L — ABNORMAL HIGH (ref 15–41)
Albumin: 1.6 g/dL — ABNORMAL LOW (ref 3.5–5.0)
Alkaline Phosphatase: 357 U/L — ABNORMAL HIGH (ref 38–126)
Anion gap: 9 (ref 5–15)
BUN: 24 mg/dL — ABNORMAL HIGH (ref 8–23)
CO2: 25 mmol/L (ref 22–32)
Calcium: 8.3 mg/dL — ABNORMAL LOW (ref 8.9–10.3)
Chloride: 100 mmol/L (ref 98–111)
Creatinine, Ser: 3.42 mg/dL — ABNORMAL HIGH (ref 0.44–1.00)
GFR, Estimated: 14 mL/min — ABNORMAL LOW (ref >60)
Glucose, Bld: 148 mg/dL — ABNORMAL HIGH (ref 70–99)
Potassium: 3.3 mmol/L — ABNORMAL LOW (ref 3.5–5.1)
Sodium: 134 mmol/L — ABNORMAL LOW (ref 135–145)
Total Bilirubin: 1 mg/dL (ref 0.3–1.2)
Total Protein: 5.7 g/dL — ABNORMAL LOW (ref 6.5–8.1)

## 2022-03-21 LAB — CBC WITH DIFFERENTIAL/PLATELET
Abs Immature Granulocytes: 0.16 10*3/uL — ABNORMAL HIGH (ref 0.00–0.07)
Basophils Absolute: 0 10*3/uL (ref 0.0–0.1)
Basophils Relative: 0 %
Eosinophils Absolute: 0 10*3/uL (ref 0.0–0.5)
Eosinophils Relative: 0 %
HCT: 30.3 % — ABNORMAL LOW (ref 36.0–46.0)
Hemoglobin: 9.7 g/dL — ABNORMAL LOW (ref 12.0–15.0)
Immature Granulocytes: 1 %
Lymphocytes Relative: 10 %
Lymphs Abs: 1.5 10*3/uL (ref 0.7–4.0)
MCH: 29.2 pg (ref 26.0–34.0)
MCHC: 32 g/dL (ref 30.0–36.0)
MCV: 91.3 fL (ref 80.0–100.0)
Monocytes Absolute: 0.6 10*3/uL (ref 0.1–1.0)
Monocytes Relative: 4 %
Neutro Abs: 13 10*3/uL — ABNORMAL HIGH (ref 1.7–7.7)
Neutrophils Relative %: 85 %
Platelets: 282 10*3/uL (ref 150–400)
RBC: 3.32 MIL/uL — ABNORMAL LOW (ref 3.87–5.11)
RDW: 17.4 % — ABNORMAL HIGH (ref 11.5–15.5)
WBC: 15.3 10*3/uL — ABNORMAL HIGH (ref 4.0–10.5)
nRBC: 0 % (ref 0.0–0.2)

## 2022-03-21 LAB — GLUCOSE, CAPILLARY
Glucose-Capillary: 139 mg/dL — ABNORMAL HIGH (ref 70–99)
Glucose-Capillary: 144 mg/dL — ABNORMAL HIGH (ref 70–99)

## 2022-03-21 LAB — MAGNESIUM: Magnesium: 1.8 mg/dL (ref 1.7–2.4)

## 2022-03-21 LAB — PHOSPHORUS: Phosphorus: 3 mg/dL (ref 2.5–4.6)

## 2022-03-21 MED ORDER — VANCOMYCIN HCL IN DEXTROSE 1-5 GM/200ML-% IV SOLN
1000.0000 mg | INTRAVENOUS | 0 refills | Status: AC
  Filled 2022-03-21: qty 4000, 47d supply, fill #0

## 2022-03-21 MED ORDER — SODIUM THIOSULFATE 250 MG/ML IV SOLN: 25.0000 g | INTRAVENOUS | 0 refills | Status: AC

## 2022-03-21 MED ORDER — ALUM & MAG HYDROXIDE-SIMETH 200-200-20 MG/5ML PO SUSP
15.0000 mL | ORAL | Status: DC | PRN
Start: 2022-03-21 — End: 2022-03-21

## 2022-03-21 MED ORDER — HALOPERIDOL 1 MG PO TABS
1.0000 mg | ORAL_TABLET | Freq: Four times a day (QID) | ORAL | 0 refills | Status: AC | PRN
  Filled 2022-03-21: qty 30, 8d supply, fill #0

## 2022-03-21 MED ORDER — RISPERIDONE 0.5 MG PO TABS
0.5000 mg | ORAL_TABLET | Freq: Two times a day (BID) | ORAL | 0 refills | Status: AC
  Filled 2022-03-21: qty 60, 30d supply, fill #0

## 2022-03-21 MED ORDER — HEPARIN SODIUM (PORCINE) 1000 UNIT/ML IJ SOLN
INTRAMUSCULAR | Status: AC
  Filled 2022-03-21: qty 10

## 2022-03-21 MED ORDER — OXYCODONE HCL 5 MG PO TABS
ORAL_TABLET | ORAL | Status: AC
  Filled 2022-03-21: qty 1

## 2022-03-21 NOTE — Progress Notes (Signed)
EMS present for pt discharge; discharge packet given to EMS personnel to take to [facility]; pt discharged via stretcher by EMS

## 2022-03-21 NOTE — Progress Notes (Signed)
Pre HD RN assessment 

## 2022-03-21 NOTE — Discharge Summary (Signed)
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 Unremarkable orbits. Other: None. IMPRESSION: 1. No evidence of acute intracranial abnormality. 2. Mild chronic small vessel ischemic disease. Electronically Signed   By: Logan Bores M.D.   On: 03/13/2022 16:24   DG Chest 2 View  Result Date: 03/13/2022 CLINICAL DATA:  Suspected sepsis. EXAM: CHEST - 2 VIEW COMPARISON:  PA chest and bilateral rib radiographs 02/24/2022 FINDINGS: Right internal jugular dual-lumen central venous catheter tips overlie the superior aspect of the right atrium. Cardiac silhouette and mediastinal contours are within normal limits. Mildly decreased lung volumes. Moderate bilateral interstitial thickening is greatest inferiorly. No pleural effusion pneumothorax. Mild multilevel degenerative disc changes of the thoracic spine. Note is made of multiple posterolateral right mildly displaced fifth through eighth rib fractures on prior radiographs not well visualized on the current study. IMPRESSION: Moderate bilateral interstitial thickening is  new from prior. This may represent interstitial pulmonary edema. No focal pneumonia is seen. Electronically Signed   By: Yvonne Kendall M.D.   On: 03/13/2022 15:33   DG Abd  1 View  Result Date: 02/25/2022 CLINICAL DATA:  Diarrhea.  Abdominal pain. EXAM: ABDOMEN - 1 VIEW COMPARISON:  CT 02/22/2022 FINDINGS: Bowel gas pattern does not suggest ileus or obstruction. Moderate amount of stool in the rectum. Vascular calcification is noted. No acute bone finding. IMPRESSION: Unremarkable bowel gas pattern. Moderate amount of fecal matter in the rectum. Electronically Signed   By: Nelson Chimes M.D.   On: 02/25/2022 13:45   DG Ribs Unilateral Left  Result Date: 02/24/2022 CLINICAL DATA:  Left rib pain. EXAM: LEFT RIBS - 2 VIEW COMPARISON:  None Available. FINDINGS: Seventh anterior rib fracture noted. This is also noted on prior abdominal CT scan 02/22/2022 in peers subacute/healing. IMPRESSION: Seventh anterior rib fracture appears subacute/healing. Electronically Signed   By: Marijo Sanes M.D.   On: 02/24/2022 11:05   DG Ribs Unilateral W/Chest Right  Result Date: 02/24/2022 CLINICAL DATA:  Right rib pain. EXAM: RIGHT RIBS AND CHEST - 3+ VIEW COMPARISON:  Multiple prior chest x-rays. FINDINGS: The right IJ dialysis catheter is stable. The heart is stable in size. Stable tortuosity and ectasia of the thoracic aorta. Streaky left basilar scarring changes. No pleural effusion or pneumothorax. Healing right posterior rib fractures, 5 through 8. IMPRESSION: 1. Healing right posterior rib fractures, 5 through 8. 2. No acute cardiopulmonary findings. Electronically Signed   By: Marijo Sanes M.D.   On: 02/24/2022 11:00   US PELVIS (TRANSABDOMINAL ONLY)  Result Date: 02/23/2022 CLINICAL DATA:  Endometrial thickening seen on CT EXAM: TRANSABDOMINAL ULTRASOUND OF PELVIS TECHNIQUE: Transabdominal ultrasound examination of the pelvis was performed including evaluation of the uterus, ovaries, adnexal regions, and pelvic  cul-de-sac. COMPARISON:  CT 02/22/2022, ultrasound 12/19/2021 FINDINGS: Uterus Measurements: 9.0 x 3.6 x 5.3 cm = volume: 88 mL. 1.4 cm calcification of the posterior uterine body near the fundus, likely reflecting a small calcified uterine fibroid. Endometrium Thickness: 11 mm. Heterogeneous appearance of the endometrial cavity in the lower uterine segment with trace fluid. Right ovary Not visualized. Left ovary Not visualized. Other findings: Layering debris was noted within the urinary bladder lumen. IMPRESSION: 1. Abnormally thickened endometrium measuring up to 11 mm. Trace fluid within the endometrial canal at the lower uterine segment. Endometrial sampling is indicated to exclude carcinoma. 2. Nonvisualization of the bilateral ovaries. 3. Layering debris noted within the urinary bladder, which could be secondary to stasis possibly blood products. Correlate with urinalysis. Electronically Signed   By: Davina Poke D.O.   On: 02/23/2022 17:28   CT ABDOMEN PELVIS WO CONTRAST  Result Date: 02/22/2022 CLINICAL DATA:  Patient fell out of bed.  Found down.  Bedsores. EXAM: CT ABDOMEN AND PELVIS WITHOUT CONTRAST TECHNIQUE: Multidetector CT imaging of the abdomen and pelvis was performed following the standard protocol without IV contrast. RADIATION DOSE REDUCTION: This exam was performed according to the departmental dose-optimization program which includes automated exposure control, adjustment of the mA and/or kV according to patient size and/or use of iterative reconstruction technique. COMPARISON:  01/01/2022 FINDINGS: Lower chest: Nonacute posterolateral right rib fractures evident. Hepatobiliary: Nodular liver contour is compatible with cirrhosis. No suspicious focal abnormality in the liver on this study without intravenous contrast. There is no evidence for gallstones, gallbladder wall thickening, or pericholecystic fluid. No intrahepatic or extrahepatic biliary dilation. Pancreas: No focal mass  lesion. No dilatation of the main duct. No intraparenchymal cyst. No peripancreatic edema. Spleen: 16 mm low-density lesion in the medial spleen is stable, likely benign. Adrenals/Urinary Tract: No adrenal nodule or mass. Tiny bilateral  renal lesions of varying attenuation are stable, likely a combination of simple and complex cyst. No evidence for hydroureter. Bladder is distended. Stomach/Bowel: Stomach is unremarkable. No gastric wall thickening. No evidence of outlet obstruction. Duodenum is normally positioned as is the ligament of Treitz. No small bowel wall thickening. No small bowel dilatation. The terminal ileum is normal. The appendix is not well visualized, but there is no edema or inflammation in the region of the cecum. No gross colonic mass. No colonic wall thickening. Vascular/Lymphatic: There is mild atherosclerotic calcification of the abdominal aorta without aneurysm. There is no gastrohepatic or hepatoduodenal ligament lymphadenopathy. No retroperitoneal or mesenteric lymphadenopathy. No pelvic sidewall lymphadenopathy. Reproductive: Endometrial canal measures 13 mm thickness in the lower uterine segment, abnormal in a postmenopausal female. There is no adnexal mass. Other: No intraperitoneal free fluid. Musculoskeletal: No worrisome lytic or sclerotic osseous abnormality. No evidence for lumbar spine fracture Bilateral pars interarticularis defects noted at L5. Sacral decubitus ulcer is evident. IMPRESSION: 1. No acute findings in the abdomen or pelvis. 2. Nodular liver contour compatible with cirrhosis. 3. Endometrial canal measures 13 mm thickness in the lower uterine segment, abnormal in a postmenopausal female. Pelvic ultrasound recommended to further evaluate. 4. 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 No acute bony abnormality identified in the lumbar spine. Please see report for lumbar spine CT performed the same time and dictated separately. 5. Bilateral sacral decubitus ulcers. 6.   Aortic Atherosclerois (ICD10-170.0) Electronically Signed   By: Misty Stanley M.D.   On: 02/22/2022 13:50   CT L-SPINE NO CHARGE  Result Date: 02/22/2022 CLINICAL DATA:  Fall out of bed. Dialysis patient, patient states was left in the floor for 3 hours. EXAM: CT LUMBAR SPINE WITHOUT CONTRAST TECHNIQUE: Multidetector CT imaging of the lumbar spine was performed without intravenous contrast administration. Multiplanar CT image reconstructions were also generated. RADIATION DOSE REDUCTION: This exam was performed according to the departmental dose-optimization program which includes automated exposure control, adjustment of the mA and/or kV according to patient size and/or use of iterative reconstruction technique. COMPARISON:  None Available. FINDINGS: Segmentation: 5 lumbar type vertebrae. Alignment: Straightening of the lumbar spine. Grade 1 anterolisthesis of L5 with bilateral spondylolysis. Vertebrae: No acute fracture or focal pathologic process. Paraspinal and other soft tissues: Marked subcutaneous soft tissue edema and skin irregularity about the lower lumbar/pelvic area consistent with history of known decubitus ulcers. No appreciable fluid collection. Disc levels: T12-L1: No significant finding. L1-L2: No significant finding. L2-L3: Mild disc bulge without significant spinal canal or neural foraminal stenosis. L3-L4: Asymmetric disc bulge to the right with mild left lateral recess stenosis. No significant neural foraminal or spinal canal stenosis. L4-L5: Moderate bilateral lateral recess stenosis and mild spinal canal stenosis. Mild bilateral facet joint arthropathy. L5-S1: Moderate bilateral facet joint arthropathy. No significant spinal canal or neural foraminal stenosis. IMPRESSION: 1.  No acute fracture or traumatic subluxation. 2.  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 3. Skin thickening and deep skin wound in the low lumbar/sacral area consistent with patient's known history  of decubitus ulcers. Clinical correlation is suggested. Electronically Signed   By: Keane Police D.O.   On: 02/22/2022 13:43   CT HEAD WO CONTRAST (5MM)  Result Date: 02/22/2022 CLINICAL DATA:  Fall with head and neck trauma. EXAM: CT HEAD WITHOUT CONTRAST CT CERVICAL SPINE WITHOUT CONTRAST TECHNIQUE: Multidetector CT imaging of the head and cervical spine was performed following the standard protocol without intravenous contrast. Multiplanar CT image reconstructions of the  cervical spine were also generated. RADIATION DOSE REDUCTION: This exam was performed according to the departmental dose-optimization program which includes automated exposure control, adjustment of the mA and/or kV according to patient size and/or use of iterative reconstruction technique. COMPARISON:  12/18/2021 FINDINGS: CT HEAD FINDINGS Brain: Ventricles, cisterns and other CSF spaces are normal. There is mild chronic ischemic microvascular disease. There is no mass, mass effect, shift of midline structures or acute hemorrhage. No evidence of acute infarction. Vascular: No hyperdense vessel or unexpected calcification. Skull: Normal. Negative for fracture or focal lesion. Sinuses/Orbits: No acute finding. Other: None. CT CERVICAL SPINE FINDINGS Alignment: Normal. Skull base and vertebrae: Vertebral body heights are maintained. There is mild spondylosis throughout the cervical spine to include facet arthropathy and uncovertebral joint spurring. Bilateral neural foraminal narrowing at the C4-5 level and C5-6 levels. No acute fracture. Atlantoaxial articulation is unremarkable. Soft tissues and spinal canal: Prevertebral soft tissues are normal. No canal stenosis. Disc levels:  Disc space narrowing at the C4-5 and C5-6 levels. Upper chest: No acute findings. Other: None. IMPRESSION: 1. No acute brain injury. 2. Mild chronic ischemic microvascular disease. 3. No acute cervical spine injury. 4. Mild spondylosis of the cervical spine with disc  disease at the C4-5 and C5-6 levels. Bilateral neural foraminal narrowing at the C4-5 and C5-6 levels. Electronically Signed   By: Marin Olp M.D.   On: 02/22/2022 13:38   CT Cervical Spine Wo Contrast  Result Date: 02/22/2022 CLINICAL DATA:  Fall with head and neck trauma. EXAM: CT HEAD WITHOUT CONTRAST CT CERVICAL SPINE WITHOUT CONTRAST TECHNIQUE: Multidetector CT imaging of the head and cervical spine was performed following the standard protocol without intravenous contrast. Multiplanar CT image reconstructions of the cervical spine were also generated. RADIATION DOSE REDUCTION: This exam was performed according to the departmental dose-optimization program which includes automated exposure control, adjustment of the mA and/or kV according to patient size and/or use of iterative reconstruction technique. COMPARISON:  12/18/2021 FINDINGS: CT HEAD FINDINGS Brain: Ventricles, cisterns and other CSF spaces are normal. There is mild chronic ischemic microvascular disease. There is no mass, mass effect, shift of midline structures or acute hemorrhage. No evidence of acute infarction. Vascular: No hyperdense vessel or unexpected calcification. Skull: Normal. Negative for fracture or focal lesion. Sinuses/Orbits: No acute finding. Other: None. CT CERVICAL SPINE FINDINGS Alignment: Normal. Skull base and vertebrae: Vertebral body heights are maintained. There is mild spondylosis throughout the cervical spine to include facet arthropathy and uncovertebral joint spurring. Bilateral neural foraminal narrowing at the C4-5 level and C5-6 levels. No acute fracture. Atlantoaxial articulation is unremarkable. Soft tissues and spinal canal: Prevertebral soft tissues are normal. No canal stenosis. Disc levels:  Disc space narrowing at the C4-5 and C5-6 levels. Upper chest: No acute findings. Other: None. IMPRESSION: 1. No acute brain injury. 2. Mild chronic ischemic microvascular disease. 3. No acute cervical spine injury.  4. Mild spondylosis of the cervical spine with disc disease at the C4-5 and C5-6 levels. Bilateral neural foraminal narrowing at the C4-5 and C5-6 levels. Electronically Signed   By: Marin Olp M.D.   On: 02/22/2022 13:38   DG Chest Portable 1 View  Result Date: 02/22/2022 CLINICAL DATA:  Fall out of bed this morning. EXAM: PORTABLE CHEST 1 VIEW COMPARISON:  02/07/2022 FINDINGS: Right IJ venous catheter unchanged. Lungs are adequately inflated with mild hazy prominence of the right perihilar vessels likely mild asymmetric vascular congestion. No airspace consolidation or effusion. No pneumothorax. Cardiomediastinal silhouette and remainder  of the exam is unchanged. No acute fracture visualized. IMPRESSION: Suggestion of mild asymmetric vascular congestion. Electronically Signed   By: Marin Olp M.D.   On: 02/22/2022 10:38    Microbiology: Recent Results (from the past 240 hour(s))  Culture, blood (Routine x 2)     Status: None   Collection Time: 03/13/22  2:39 PM   Specimen: BLOOD  Result Value Ref Range Status   Specimen Description BLOOD RIGHT ANTECUBITAL  Final   Special Requests   Final    BOTTLES DRAWN AEROBIC AND ANAEROBIC Blood Culture adequate volume   Culture   Final    NO GROWTH 5 DAYS Performed at [facility], [address] [place], [place] [address]    Report Status 03/18/2022 FINAL  Final  Culture, blood (Routine X 2) w Reflex to ID Panel     Status: Abnormal   Collection Time: 03/14/22  9:51 PM   Specimen: BLOOD  Result Value Ref Range Status   Specimen Description   Final    BLOOD LEFT ANTECUBITAL Performed at [facility], [place] [address] [place], [place] [address]    Special Requests   Final    BOTTLES DRAWN AEROBIC AND ANAEROBIC Blood Culture adequate volume Performed at [facility], [address] [place], [place] [address]    Culture  Setup Time   Final    GRAM POSITIVE COCCI IN BOTH AEROBIC AND ANAEROBIC BOTTLES CRITICAL  RESULT CALLED TO, READ BACK BY AND VERIFIED WITH: NATHAN BLUE 03/15/22 2257 DE GRAM STAIN REVIEWED-AGREE WITH RESULT    Culture (A)  Final    STAPHYLOCOCCUS EPIDERMIDIS THE SIGNIFICANCE OF ISOLATING THIS ORGANISM FROM A SINGLE SET OF BLOOD CULTURES WHEN MULTIPLE SETS ARE DRAWN IS UNCERTAIN. PLEASE NOTIFY THE MICROBIOLOGY DEPARTMENT WITHIN ONE WEEK IF SPECIATION AND SENSITIVITIES ARE REQUIRED. Performed at [facility], [place] [address], [place], [place] [address]    Report Status 03/18/2022 FINAL  Final  Blood Culture ID Panel (Reflexed)     Status: Abnormal   Collection Time: 03/14/22  9:51 PM  Result Value Ref Range Status   Enterococcus faecalis NOT DETECTED NOT DETECTED Final   Enterococcus Faecium NOT DETECTED NOT DETECTED Final   Listeria monocytogenes NOT DETECTED NOT DETECTED Final   Staphylococcus species DETECTED (A) NOT DETECTED Final    Comment: CRITICAL RESULT CALLED TO, READ BACK BY AND VERIFIED WITH: NATHAN BELUE PHARMD AT 2255 03/15/2022 DE    Staphylococcus aureus (BCID) NOT DETECTED NOT DETECTED Final   Staphylococcus epidermidis DETECTED (A) NOT DETECTED Final    Comment: Methicillin (oxacillin) resistant coagulase negative staphylococcus. Possible blood culture contaminant (unless isolated from more than one blood culture draw or clinical case suggests pathogenicity). No antibiotic treatment is indicated for blood  culture contaminants. CRITICAL RESULT CALLED TO, READ BACK BY AND VERIFIED WITH: NATHAN BELUE PHARMD AT 2255 03/15/2022 DE    Staphylococcus lugdunensis NOT DETECTED NOT DETECTED Final   Streptococcus species NOT DETECTED NOT DETECTED Final   Streptococcus agalactiae NOT DETECTED NOT DETECTED Final   Streptococcus pneumoniae NOT DETECTED NOT DETECTED Final   Streptococcus pyogenes NOT DETECTED NOT DETECTED Final   A.calcoaceticus-baumannii NOT DETECTED NOT DETECTED Final   Bacteroides fragilis NOT DETECTED NOT DETECTED Final   Enterobacterales NOT  DETECTED NOT DETECTED Final   Enterobacter cloacae complex NOT DETECTED NOT DETECTED Final   Escherichia coli NOT DETECTED NOT DETECTED Final   Klebsiella aerogenes NOT DETECTED NOT DETECTED Final   Klebsiella oxytoca NOT  DETECTED NOT DETECTED Final   Klebsiella pneumoniae NOT DETECTED NOT DETECTED Final   Proteus species NOT DETECTED NOT DETECTED Final   Salmonella species NOT DETECTED NOT DETECTED Final   Serratia marcescens NOT DETECTED NOT DETECTED Final   Haemophilus influenzae NOT DETECTED NOT DETECTED Final   Neisseria meningitidis NOT DETECTED NOT DETECTED Final   Pseudomonas aeruginosa NOT DETECTED NOT DETECTED Final   Stenotrophomonas maltophilia NOT DETECTED NOT DETECTED Final   Candida albicans NOT DETECTED NOT DETECTED Final   Candida auris NOT DETECTED NOT DETECTED Final   Candida glabrata NOT DETECTED NOT DETECTED Final   Candida krusei NOT DETECTED NOT DETECTED Final   Candida parapsilosis NOT DETECTED NOT DETECTED Final   Candida tropicalis NOT DETECTED NOT DETECTED Final   Cryptococcus neoformans/gattii NOT DETECTED NOT DETECTED Final   Methicillin resistance mecA/C DETECTED (A) NOT DETECTED Final    Comment: CRITICAL RESULT CALLED TO, READ BACK BY AND VERIFIED WITHLloyd Huger PHARMD AT 2255 03/15/2022 DE Performed at [facility], [address] [place], [place] [address]      Labs: Basic Metabolic Panel: Recent Labs  Lab 03/16/22 0437 03/17/22 0038 03/19/22 0416 03/20/22 0728 03/21/22 0447  NA 135 135 138 137 134*  K 3.4* 3.8 3.6 3.4* 3.3*  CL 101 99 102 101 100  CO2 27 25 26 27 25   GLUCOSE 102* 120* 107* 63* 148*  BUN 23 30* 24* 18 24*  CREATININE 3.19* 3.86* 3.30* 2.90* 3.42*  CALCIUM 8.1* 8.6* 8.3* 8.5* 8.3*  MG  --   --   --  1.7 1.8  PHOS  --  3.9 2.9 2.6  2.5 3.0   Liver Function Tests: Recent Labs  Lab 03/17/22 0038 03/19/22 0416 03/20/22 0728 03/21/22 0447  AST  --   --  74* 63*  ALT  --   --  30 32  ALKPHOS  --   --   369* 357*  BILITOT  --   --  0.9 1.0  PROT  --   --  6.0* 5.7*  ALBUMIN 1.7* 1.7* 1.8* 1.6*   No results for input(s): "LIPASE", "AMYLASE" in the last 168 hours. No results for input(s): "AMMONIA" in the last 168 hours. CBC: Recent Labs  Lab 03/14/22 2151 03/16/22 0437 03/17/22 0044 03/19/22 0416 03/20/22 0728 03/21/22 0447  WBC 16.8* 11.7*  --  14.9* 14.2* 15.3*  NEUTROABS  --   --   --   --  12.2* 13.0*  HGB 8.0* 6.1* 9.7* 10.1* 10.3* 9.7*  HCT 26.1* 20.0* 30.4* 31.5* 32.6* 30.3*  MCV 94.2 95.7  --  90.8 90.6 91.3  PLT 296 214  --  288 290 282   Cardiac Enzymes: No results for input(s): "CKTOTAL", "CKMB", "CKMBINDEX", "TROPONINI" in the last 168 hours. BNP: BNP (last 3 results) Recent Labs    03/13/22 1605  BNP 454.9*    ProBNP (last 3 results) No results for input(s): "PROBNP" in the last 8760 hours.  CBG: Recent Labs  Lab 03/20/22 0818 03/20/22 0915 03/20/22 1223 03/20/22 1746 03/20/22 2050  GLUCAP 64* 84 172* 143* 165*       Signed:  Dia Crawford, MD [facility]

## 2022-03-21 NOTE — Progress Notes (Signed)
Post HD RN assessment 

## 2022-03-21 NOTE — Progress Notes (Signed)
Patient alert, transported to dialysis via bed with transport.

## 2022-03-21 NOTE — Progress Notes (Signed)
Patient is tearful. Says she wants to "go to room and die." MD is aware and coming to HD unit to speak w/ patient. Safety maintained, vss.
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
## 2022-03-21 NOTE — TOC Transition Note (Addendum)
Transition of Care [facility]) - CM/SW Discharge Note   Patient Details  Name: Theresa Warren MRN: [id number] Date of Birth: [birth year]  Transition of Care [facility]) CM/SW Contact:  Theresa Price, RN Phone Number: 03/21/2022, 1:03 PM   Clinical Narrative:  8/19: Patient to be discharged and transported to [facility] via [facility]. Report to be called to Theresa Warren at [phone] and going to Room 709. Patient is DNR for transport information. EMS forms printed to unit. HCPOA notified of transport via VM. DC Summary inboxed to [facility] via electronic HUB.   Theresa Davies RN CM  235 pm [facility] called for transport. Theresa Davies RN CM   Final next level of care: Skilled Nursing Facility Barriers to Discharge: Barriers Resolved   Patient Goals and CMS Choice     Choice offered to / list presented to : [facility] POA / Guardian  Discharge Placement              Patient chooses bed at: [facility] Patient to be transferred to facility by: [facility] Name of family member notified: Theresa Warren [phone] (Left VM with call back number and was aware of transport yesterday on 03/20/22) Patient and family notified of of transfer: 03/21/22  Discharge Plan and Services                DME Arranged: N/A DME Agency: NA       HH Arranged: NA HH Agency: NA        Social Determinants of Health ([place]) Interventions     Readmission Risk Interventions     No data to display

## 2022-03-21 NOTE — Progress Notes (Signed)
HD Start

## 2022-03-21 NOTE — TOC Progression Note (Addendum)
Transition of Care [facility]) - Progression Note    Patient Details  Name: Theresa Warren MRN: [id number] Date of Birth: August 24, 1955  Transition of Care [facility]) CM/SW Contact  Izola Price, RN Phone Number: 03/21/2022, 10:01 AM  Clinical Narrative:  03/21/22: [name] HCPOA notes as of 03/19/22: Theresa Warren [phone]. Currently in HD. Pending potential discharge and transfer to [place] today. Left message for [facility]. Simmie Davies RN CM   Update: Pending room from [facility] and if discharged. Simmie Davies RN CM        Expected Discharge Plan and Services           Expected Discharge Date: 03/21/22                                     Social Determinants of Health (SDOH) Interventions    Readmission Risk Interventions     No data to display

## 2022-03-21 NOTE — Plan of Care (Signed)
Problem: Education: Goal: Ability to describe self-care measures that may prevent or decrease complications (Diabetes Survival Skills Education) will improve Outcome: Adequate for Discharge Goal: Individualized Educational Video(s) Outcome: Adequate for Discharge   Problem: Coping: Goal: Ability to adjust to condition or change in health will improve Outcome: Adequate for Discharge   Problem: Fluid Volume: Goal: Ability to maintain a balanced intake and output will improve Outcome: Adequate for Discharge   Problem: Health Behavior/Discharge Planning: Goal: Ability to identify and utilize available resources and services will improve Outcome: Adequate for Discharge Goal: Ability to manage health-related needs will improve Outcome: Adequate for Discharge   Problem: Metabolic: Goal: Ability to maintain appropriate glucose levels will improve Outcome: Adequate for Discharge   Problem: Nutritional: Goal: Maintenance of adequate nutrition will improve Outcome: Adequate for Discharge Goal: Progress toward achieving an optimal weight will improve Outcome: Adequate for Discharge   Problem: Skin Integrity: Goal: Risk for impaired skin integrity will decrease Outcome: Adequate for Discharge   Problem: Tissue Perfusion: Goal: Adequacy of tissue perfusion will improve Outcome: Adequate for Discharge   Problem: Education: Goal: Knowledge of General Education information will improve Description: Including pain rating scale, medication(s)/side effects and non-pharmacologic comfort measures Outcome: Adequate for Discharge   Problem: Health Behavior/Discharge Planning: Goal: Ability to manage health-related needs will improve Outcome: Adequate for Discharge   Problem: Clinical Measurements: Goal: Ability to maintain clinical measurements within normal limits will improve Outcome: Adequate for Discharge Goal: Will remain free from infection Outcome: Adequate for Discharge Goal:  Diagnostic test results will improve Outcome: Adequate for Discharge Goal: Respiratory complications will improve Outcome: Adequate for Discharge Goal: Cardiovascular complication will be avoided Outcome: Adequate for Discharge   Problem: Activity: Goal: Risk for activity intolerance will decrease Outcome: Adequate for Discharge   Problem: Nutrition: Goal: Adequate nutrition will be maintained Outcome: Adequate for Discharge   Problem: Coping: Goal: Level of anxiety will decrease Outcome: Adequate for Discharge   Problem: Elimination: Goal: Will not experience complications related to bowel motility Outcome: Adequate for Discharge Goal: Will not experience complications related to urinary retention Outcome: Adequate for Discharge   Problem: Pain Managment: Goal: General experience of comfort will improve Outcome: Adequate for Discharge   Problem: Safety: Goal: Ability to remain free from injury will improve Outcome: Adequate for Discharge   Problem: Skin Integrity: Goal: Risk for impaired skin integrity will decrease Outcome: Adequate for Discharge   Problem: Education: Goal: Knowledge of General Education information will improve Description: Including pain rating scale, medication(s)/side effects and non-pharmacologic comfort measures Outcome: Adequate for Discharge   Problem: Health Behavior/Discharge Planning: Goal: Ability to manage health-related needs will improve Outcome: Adequate for Discharge   Problem: Clinical Measurements: Goal: Ability to maintain clinical measurements within normal limits will improve Outcome: Adequate for Discharge Goal: Will remain free from infection Outcome: Adequate for Discharge Goal: Diagnostic test results will improve Outcome: Adequate for Discharge Goal: Respiratory complications will improve Outcome: Adequate for Discharge Goal: Cardiovascular complication will be avoided Outcome: Adequate for Discharge   Problem:  Activity: Goal: Risk for activity intolerance will decrease Outcome: Adequate for Discharge   Problem: Nutrition: Goal: Adequate nutrition will be maintained Outcome: Adequate for Discharge   Problem: Coping: Goal: Level of anxiety will decrease Outcome: Adequate for Discharge   Problem: Elimination: Goal: Will not experience complications related to bowel motility Outcome: Adequate for Discharge Goal: Will not experience complications related to urinary retention Outcome: Adequate for Discharge   Problem: Pain Managment: Goal: General experience of comfort   will improve Outcome: Adequate for Discharge   Problem: Safety: Goal: Ability to remain free from injury will improve Outcome: Adequate for Discharge   Problem: Skin Integrity: Goal: Risk for impaired skin integrity will decrease Outcome: Adequate for Discharge   

## 2022-03-21 NOTE — Progress Notes (Signed)
Pt HD complete, goal met, no complications, vancomycin, sodium thiosulfate and oxy for pain received w/ HD. Report to primary RN. Start 0802 End 1135 2L fluid removed 84L BVP UTA bed weight d/t patient pillows for pain and wounds.

## 2022-03-22 ENCOUNTER — Other Ambulatory Visit: Payer: Self-pay

## 2022-03-23 ENCOUNTER — Other Ambulatory Visit: Payer: Self-pay

## 2022-03-25 ENCOUNTER — Encounter: Payer: Medicare Other | Attending: Internal Medicine | Admitting: Internal Medicine

## 2022-03-25 DIAGNOSIS — A4102 Sepsis due to Methicillin resistant Staphylococcus aureus: Secondary | ICD-10-CM | POA: Diagnosis not present

## 2022-03-25 DIAGNOSIS — E11622 Type 2 diabetes mellitus with other skin ulcer: Secondary | ICD-10-CM

## 2022-03-25 DIAGNOSIS — L97818 Non-pressure chronic ulcer of other part of right lower leg with other specified severity: Secondary | ICD-10-CM | POA: Diagnosis not present

## 2022-03-25 DIAGNOSIS — A411 Sepsis due to other specified staphylococcus: Secondary | ICD-10-CM | POA: Diagnosis not present

## 2022-03-25 DIAGNOSIS — L89310 Pressure ulcer of right buttock, unstageable: Secondary | ICD-10-CM | POA: Diagnosis not present

## 2022-03-25 DIAGNOSIS — L97828 Non-pressure chronic ulcer of other part of left lower leg with other specified severity: Secondary | ICD-10-CM

## 2022-03-25 NOTE — Progress Notes (Signed)
Theresa Warren ([id number]) Visit Report for 03/25/2022 Abuse Risk Screen Details Patient Name: Theresa Warren, Theresa Warren. Date of Service: 03/25/2022 8:45 AM Medical Record Number: [id number] Patient Account Number: [id number] Date of Birth/Sex: 03-08-1956 (66 y.o. F) Treating RN: Carlene Coria Primary Care Brittain Hosie: Crist Infante Other Clinician: Referring Anel Purohit: Rica Koyanagi Treating Rhett Najera/Extender: Yaakov Guthrie in Treatment: 0 Abuse Risk Screen Items Answer ABUSE RISK SCREEN: Has anyone close to you tried to hurt or harm you recentlyo No Do you feel uncomfortable with anyone in your familyo No Has anyone forced you do things that you didnot want to doo No Electronic Signature(s) Signed: 03/25/2022 11:27:47 AM By: Carlene Coria RN Entered By: Carlene Coria on 03/25/2022 11:27:46 Theresa Warren ([id number]) -------------------------------------------------------------------------------- Activities of Daily Living Details Patient Name: Theresa Warren. Date of Service: 03/25/2022 8:45 AM Medical Record Number: [id number] Patient Account Number: [id number] Date of Birth/Sex: 1956/05/03 (66 y.o. F) Treating RN: Carlene Coria Primary Care Naya Ilagan: Crist Infante Other Clinician: Referring Joseth Weigel: Rica Koyanagi Treating Matia Zelada/Extender: Yaakov Guthrie in Treatment: 0 Activities of Daily Living Items Answer Activities of Daily Living (Please select one for each item) Drive Automobile Not Able Take Medications Need Assistance Use Telephone Need Assistance Care for Appearance Not Able Use Toilet Not Able Bath / Shower Not Able Dress Self Not Able Feed Self Need Assistance Walk Not Able Get In / Out Bed Not Able Housework Not Able Prepare Meals Not Able Handle Money Not Able Shop for Self Not Able Electronic Signature(s) Signed: 03/25/2022 11:28:30 AM By: Carlene Coria RN Entered By: Carlene Coria on 03/25/2022 11:28:29 Theresa Warren  ([id number]) -------------------------------------------------------------------------------- Education Screening Details Patient Name: Theresa Most D. Date of Service: 03/25/2022 8:45 AM Medical Record Number: [id number] Patient Account Number: [id number] Date of Birth/Sex: Jan 06, 1956 (66 y.o. F) Treating RN: Carlene Coria Primary Care Meagan Spease: Crist Infante Other Clinician: Referring Lief Palmatier: Rica Koyanagi Treating Anniya Whiters/Extender: Yaakov Guthrie in Treatment: 0 Primary Learner Assessed: Patient Learning Preferences/Education Level/Primary Language Learning Preference: Explanation Highest Education Level: High School Preferred Language: English Cognitive Barrier Language Barrier: No Translator Needed: No Memory Deficit: No Emotional Barrier: No Cultural/Religious Beliefs Affecting Medical Care: No Physical Barrier Impaired Vision: No Impaired Hearing: No Decreased Hand dexterity: No Knowledge/Comprehension Knowledge Level: Medium Comprehension Level: Medium Ability to understand written instructions: Medium Ability to understand verbal instructions: Medium Motivation Anxiety Level: Anxious Cooperation: Cooperative Education Importance: Acknowledges Need Interest in Health Problems: Asks Questions Perception: Coherent Willingness to Engage in Self-Management Medium Activities: Readiness to Engage in Self-Management Medium Activities: Electronic Signature(s) Signed: 03/25/2022 11:34:36 AM By: Carlene Coria RN Entered By: Carlene Coria on 03/25/2022 11:34:36 Theresa Warren ([id number]) -------------------------------------------------------------------------------- Fall Risk Assessment Details Patient Name: Theresa Most D. Date of Service: 03/25/2022 8:45 AM Medical Record Number: [id number] Patient Account Number: [id number] Date of Birth/Sex: April 02, 1956 (66 y.o. F) Treating RN: Carlene Coria Primary Care Johnnae Impastato: Crist Infante Other  Clinician: Referring Magdelyn Roebuck: Rica Koyanagi Treating Mearl Harewood/Extender: Yaakov Guthrie in Treatment: 0 Fall Risk Assessment Items Have you had 2 or more falls in the last 12 monthso 0 No Have you had any fall that resulted in injury in the last 12 monthso 0 No FALLS RISK SCREEN History of falling - immediate or within 3 months 0 No Secondary diagnosis (Do you have 2 or more medical diagnoseso) 0 No Ambulatory aid None/bed rest/wheelchair/nurse 0 No Crutches/cane/walker 0 No Furniture 0 No Intravenous therapy Access/Saline/Heparin Lock 0 No Gait/Transferring Normal/ bed rest/ wheelchair 0 No Weak (short steps with or  without shuffle, stooped but able to lift head while walking, may 0 No seek support from furniture) Impaired (short steps with shuffle, may have difficulty arising from chair, head down, impaired 0 No balance) Mental Status Oriented to own ability 0 No Electronic Signature(s) Signed: 03/25/2022 11:34:43 AM By: Carlene Coria RN Entered By: Carlene Coria on 03/25/2022 11:34:43 Theresa Warren ([id number]) -------------------------------------------------------------------------------- Foot Assessment Details Patient Name: Theresa Most D. Date of Service: 03/25/2022 8:45 AM Medical Record Number: [id number] Patient Account Number: [id number] Date of Birth/Sex: 09-12-1955 (66 y.o. F) Treating RN: Carlene Coria Primary Care Siobhan Zaro: Crist Infante Other Clinician: Referring Susane Bey: Rica Koyanagi Treating Nupur Hohman/Extender: Yaakov Guthrie in Treatment: 0 Foot Assessment Items Site Locations + = Sensation present, - = Sensation absent, C = Callus, U = Ulcer R = Redness, W = Warmth, M = Maceration, PU = Pre-ulcerative lesion F = Fissure, S = Swelling, D = Dryness Assessment Right: Left: Other Deformity: No No Prior Foot Ulcer: No No Prior Amputation: No No Charcot Joint: No No Ambulatory Status: Non-ambulatory Assistance Device:  Wheelchair Gait: Administrator, arts) Signed: 03/25/2022 11:35:06 AM By: Carlene Coria RN Entered By: Carlene Coria on 03/25/2022 11:35:06 Theresa Warren ([id number]) -------------------------------------------------------------------------------- Nutrition Risk Screening Details Patient Name: Theresa Most D. Date of Service: 03/25/2022 8:45 AM Medical Record Number: [id number] Patient Account Number: [id number] Date of Birth/Sex: Aug 29, 1955 (66 y.o. F) Treating RN: Carlene Coria Primary Care Jemma Rasp: Crist Infante Other Clinician: Referring Kaho Selle: Rica Koyanagi Treating Leanora Murin/Extender: Yaakov Guthrie in Treatment: 0 Height (in): 62 Weight (lbs): 286 Body Mass Index (BMI): 52.3 Nutrition Risk Screening Items Score Screening NUTRITION RISK SCREEN: I have an illness or condition that made me change the kind and/or amount of food I eat 0 No I eat fewer than two meals per day 0 No I eat few fruits and vegetables, or milk products 0 No I have three or more drinks of beer, liquor or wine almost every day 0 No I have tooth or mouth problems that make it hard for me to eat 0 No I don't always have enough money to buy the food I need 0 No I eat alone most of the time 0 No I take three or more different prescribed or over-the-counter drugs a day 1 Yes Without wanting to, I have lost or gained 10 pounds in the last six months 0 No I am not always physically able to shop, cook and/or feed myself 2 Yes Nutrition Protocols Good Risk Protocol Moderate Risk Protocol 0 Provide education on nutrition High Risk Proctocol Risk Level: Moderate Risk Score: 3 Electronic Signature(s) Signed: 03/25/2022 11:34:54 AM By: Carlene Coria RN Entered By: Carlene Coria on 03/25/2022 11:34:54

## 2022-03-25 NOTE — Progress Notes (Signed)
Theresa, Warren ([id number]) Visit Report for 03/25/2022 Arrival Information Details Patient Name: Theresa, Warren. Date of Service: 03/25/2022 8:45 AM Medical Record Number: [id number] Patient Account Number: [id number] Date of Birth/Sex: 27-Aug-1955 (66 y.o. F) Treating RN: Carlene Coria Primary Care Yandiel Bergum: Crist Infante Other Clinician: Referring Allyn Bertoni: Rica Koyanagi Treating Ambreen Tufte/Extender: Yaakov Guthrie in Treatment: 0 Visit Information Patient Arrived: Wheel Chair Arrival Time: 08:45 Accompanied By: [email or web address] Transfer Assistance: Harrel Lemon Lift Patient Identification Verified: Yes Secondary Verification Process Completed: Yes Patient Requires Transmission-Based Precautions: No Patient Has Alerts: No Electronic Signature(s) Signed: 03/25/2022 9:33:51 AM By: Carlene Coria RN Entered By: Carlene Coria on 03/25/2022 09:33:50 Revard, Theresa Warren ([id number]) -------------------------------------------------------------------------------- Multi Wound Chart Details Patient Name: Theresa Most D. Date of Service: 03/25/2022 8:45 AM Medical Record Number: [id number] Patient Account Number: [id number] Date of Birth/Sex: 04/16/1956 (66 y.o. F) Treating RN: Carlene Coria Primary Care Kehlani Vancamp: Crist Infante Other Clinician: Referring Kenzo Ozment: Rica Koyanagi Treating Amore Ackman/Extender: Yaakov Guthrie in Treatment: 0 Vital Signs Height(in): 62 Pulse(bpm): 109 Weight(lbs): 286 Blood Pressure(mmHg): 134/75 Body Mass Index(BMI): 52.3 Temperature(F): 98.4 Respiratory Rate(breaths/min): 20 Photos: Wound Location: Right Calcaneus Left, Posterior Lower Leg Right, Distal, Medial Upper Leg Wounding Event: Gradually Appeared Gradually Appeared Gradually Appeared Primary Etiology: Pressure Ulcer Calciphylaxis Calciphylaxis Date Acquired: 01/01/2022 12/01/2021 12/01/2021 Weeks of Treatment: 0 0 0 Wound Status: Open Open Open Wound Recurrence: No No No Clustered  Wound: No Yes No Measurements L x W x D (cm) 1.3x1.5x0.1 9x7x0.4 2x3x0.7 Area (cm) : 1.532 49.48 4.712 Volume (cm) : 0.153 19.792 3.299 % Reduction in Area: N/A N/A N/A % Reduction in Volume: N/A N/A N/A Classification: Category/Stage III Full Thickness Without Exposed Full Thickness Without Exposed Support Structures Support Structures Exudate Amount: Medium Medium Medium Exudate Type: Serosanguineous Serosanguineous Serosanguineous Exudate Color: red, brown red, brown red, brown Granulation Amount: Medium (34-66%) Small (1-33%) Small (1-33%) Granulation Quality: Red, Pink Pink Red Necrotic Amount: Medium (34-66%) Large (67-100%) Large (67-100%) Necrotic Tissue: Adherent Slough Eschar, [place] Exposed Structures: Fat Layer (Subcutaneous Tissue): Fat Layer (Subcutaneous Tissue): Fat Layer (Subcutaneous Tissue): Yes Yes Yes Fascia: No Fascia: No Fascia: No Tendon: No Tendon: No Tendon: No Muscle: No Muscle: No Muscle: No Joint: No Joint: No Joint: No Bone: No Bone: No Bone: No Epithelialization: N/A None None Wound Number: [id number] 5  Photos: Theresa, Warren ([id number]) Wound Location: Right, Proximal, Medial Upper Leg Right, Lateral Upper Leg Right Gluteus Wounding Event: Gradually Appeared Gradually Appeared Gradually Appeared Primary Etiology: Calciphylaxis Calciphylaxis Pressure Ulcer Date Acquired: 12/01/2021 12/01/2021 08/03/2021 Weeks of Treatment: 0 0 0 Wound Status: Open Open Open Wound Recurrence: No No No Clustered Wound: No No Yes Measurements L x W x D (cm) 3.2x3.7x0.3 7.5x8x0.5 30x21x0.5 Area (cm) : 9.299 47.124 494.801 Volume (cm) : 2.79 23.562 247.4 % Reduction in Area: N/A N/A 0.00% % Reduction in Volume: N/A N/A 0.00% Classification: Full Thickness Without Exposed Full Thickness Without Exposed Unstageable/Unclassified Support Structures Support Structures Exudate Amount: Medium Medium Medium Exudate Type:  Serosanguineous Serosanguineous Serosanguineous Exudate Color: red, brown red, brown red, brown Granulation Amount: N/A None Present (0%) Small (1-33%) Granulation Quality: N/A N/A Red, Pink Necrotic Amount: N/A Large (67-100%) Large (67-100%) Necrotic Tissue: Eschar, Adherent [place] Exposed Structures: Fascia: No Fat Layer (Subcutaneous Tissue): Fascia: No Fat Layer (Subcutaneous Tissue): Yes Fat Layer (Subcutaneous Tissue): No Fascia: No No Tendon: No Tendon: No Tendon: No Muscle: No Muscle: No Muscle: No Joint: No Joint: No Joint:  No Bone: No Bone: No Bone: No Epithelialization: None N/A None Wound Number: [id number]  Photos: Wound Location: Sacrum Left Gluteus Left, Distal Gluteus Wounding Event: Hematoma Gradually Appeared Gradually Appeared Primary Etiology: Pressure Ulcer Pressure Ulcer Pressure Ulcer Date Acquired: 08/03/2021 08/03/2021 12/01/2021 Weeks of Treatment: 0 0 0 Wound Status: Open Open Open Wound Recurrence: No No No Clustered Wound: No Yes No Measurements L x W x D (cm) 6x6x0.7 20x28x0.7 9x5x0.8 Area (cm) : 28.274 439.823 35.343 Volume (cm) : 19.792 307.876 28.274 % Reduction in Area: 0.00% 0.00% 0.00% % Reduction in Volume: 0.00% 0.00% 0.00% Classification: Category/Stage III Unstageable/Unclassified Unstageable/Unclassified Exudate Amount: Medium Medium Medium Exudate Type: Serosanguineous Serosanguineous Serosanguineous Exudate Color: red, brown red, brown red, brown Granulation Amount: Small (1-33%) Small (1-33%) Medium (34-66%) Granulation Quality: Red, Pink Red, Pink Red, Pink Necrotic Amount: Large (67-100%) Large (67-100%) Medium (34-66%) Necrotic Tissue: Eschar, Adherent Slough Eschar, Adherent [place] Exposed Structures: Fat Layer (Subcutaneous Tissue): Fascia: No Fascia: No Yes Fat Layer (Subcutaneous Tissue): Fat Layer (Subcutaneous Tissue): Fascia: No No No Tendon: No Tendon:  No Tendon: No Muscle: No Muscle: No Muscle: No Joint: No Joint: No Joint: No Bone: No Bone: No Bone: No Epithelialization: None None None Wound Number: 9 N/A N/A Photos: Theresa, Warren ([id number]) N/A N/A Wound Location: Left Upper Leg N/A N/A Wounding Event: Gradually Appeared N/A N/A Primary Etiology: Calciphylaxis N/A N/A Date Acquired: 12/01/2021 N/A N/A Weeks of Treatment: 0 N/A N/A Wound Status: Open N/A N/A Wound Recurrence: No N/A N/A Clustered Wound: Yes N/A N/A Measurements L x W x D (cm) 18x30x0.5 N/A N/A Area (cm) : 424.115 N/A N/A Volume (cm) : 212.058 N/A N/A % Reduction in Area: N/A N/A N/A % Reduction in Volume: N/A N/A N/A Classification: Full Thickness Without Exposed N/A N/A Support Structures Exudate Amount: Medium N/A N/A Exudate Type: Serosanguineous N/A N/A Exudate Color: red, brown N/A N/A Granulation Amount: None Present (0%) N/A N/A Granulation Quality: N/A N/A N/A Necrotic Amount: Large (67-100%) N/A N/A Necrotic Tissue: Eschar, Adherent Slough N/A N/A Exposed Structures: Fascia: No N/A N/A Fat Layer (Subcutaneous Tissue): No Tendon: No Muscle: No Joint: No Bone: No Epithelialization: None N/A N/A Treatment Notes Electronic Signature(s) Unsigned Entered By: Kalman Shan on 03/25/2022 10:49:09 Signature(s): Date(s): Theresa Warren ([id number]) -------------------------------------------------------------------------------- Pain Assessment Details Patient Name: Theresa, Warren. Date of Service: 03/25/2022 8:45 AM Medical Record Number: [id number] Patient Account Number: [id number] Date of Birth/Sex: 29-Jul-1956 (66 y.o. F) Treating RN: Carlene Coria Primary Care Aerie Donica: Crist Infante Other Clinician: Referring Arlyn Buerkle: Rica Koyanagi Treating Jayleah Garbers/Extender: Yaakov Guthrie in Treatment: 0 Active Problems Location of Pain Severity and Description of Pain Patient Has Paino Yes Site Locations With  Dressing Change: Yes Duration of the Pain. Constant / Intermittento Constant Rate the pain. Current Pain Level: 5 Worst Pain Level: 10 Least Pain Level: 3 Tolerable Pain Level: 5 Character of Pain Describe the Pain: Burning, Throbbing Pain Management and Medication Current Pain Management: Medication: Yes Cold Application: No Rest: Yes Massage: No Activity: No T.E.N.S.: No Heat Application: No Leg drop or elevation: No Is the Current Pain Management Adequate: Inadequate How does your wound impact your activities of daily livingo Sleep: Yes Bathing: No Appetite: No Relationship With Others: No Bladder Continence: No Emotions: No Bowel Continence: No Work: No Toileting: No Drive: No Dressing: No Hobbies: No Electronic Signature(s) Signed: 03/25/2022 9:34:20 AM By: Carlene Coria RN Entered By: Carlene Coria on 03/25/2022 09:34:20 Warren, Theresa D. ([id number]) -------------------------------------------------------------------------------- Wound Assessment  Details Patient Name: TEQUITA, MARRS. Date of Service: 03/25/2022 8:45 AM Medical Record Number: [id number] Patient Account Number: [id number] Date of Birth/Sex: 1956-05-14 (66 y.o. F) Treating RN: Carlene Coria Primary Care Ruhaan Nordahl: Crist Infante Other Clinician: Referring Auden Wettstein: Rica Koyanagi Treating Antwine Agosto/Extender: Yaakov Guthrie in Treatment: 0 Wound Status Wound Number: 1 Primary Etiology: Pressure Ulcer Wound Location: Right Calcaneus Wound Status: Open Wounding Event: Gradually Appeared Date Acquired: 01/01/2022 Weeks Of Treatment: 0 Clustered Wound: No Photos Wound Measurements Length: (cm) 1.3 Width: (cm) 1.5 Depth: (cm) 0.1 Area: (cm) 1.532 Volume: (cm) 0.153 % Reduction in Area: % Reduction in Volume: Tunneling: No Undermining: No Wound Description Classification: Category/Stage III Exudate Amount: Medium Exudate Type: Serosanguineous Exudate Color: red, brown Foul  Odor After Cleansing: No Slough/Fibrino Yes Wound Bed Granulation Amount: Medium (34-66%) Exposed Structure Granulation Quality: Red, Pink Fascia Exposed: No Necrotic Amount: Medium (34-66%) Fat Layer (Subcutaneous Tissue) Exposed: Yes Necrotic Quality: Adherent Slough Tendon Exposed: No Muscle Exposed: No Joint Exposed: No Bone Exposed: No Electronic Signature(s) Signed: 03/25/2022 9:37:45 AM By: Carlene Coria RN Entered By: Carlene Coria on 03/25/2022 09:37:45 Warren, Theresa Warren ([id number]) -------------------------------------------------------------------------------- Wound Assessment Details Patient Name: Theresa Most D. Date of Service: 03/25/2022 8:45 AM Medical Record Number: [id number] Patient Account Number: [id number] Date of Birth/Sex: 19-Aug-1955 (66 y.o. F) Treating RN: Carlene Coria Primary Care Hjalmar Ballengee: Crist Infante Other Clinician: Referring Randilyn Foisy: Rica Koyanagi Treating Talaysha Freeberg/Extender: Yaakov Guthrie in Treatment: 0 Wound Status Wound Number: 10 Primary Etiology: Calciphylaxis Wound Location: Left, Posterior Lower Leg Wound Status: Open Wounding Event: Gradually Appeared Date Acquired: 12/01/2021 Weeks Of Treatment: 0 Clustered Wound: Yes Photos Wound Measurements Length: (cm) 9 Width: (cm) 7 Depth: (cm) 0.4 Area: (cm) 49.48 Volume: (cm) 19.792 % Reduction in Area: % Reduction in Volume: Epithelialization: None Tunneling: No Undermining: No Wound Description Classification: Full Thickness Without Exposed Support Structu Exudate Amount: Medium Exudate Type: Serosanguineous Exudate Color: red, brown res Foul Odor After Cleansing: No Slough/Fibrino Yes Wound Bed Granulation Amount: Small (1-33%) Exposed Structure Granulation Quality: Pink Fascia Exposed: No Necrotic Amount: Large (67-100%) Fat Layer (Subcutaneous Tissue) Exposed: Yes Necrotic Quality: Eschar, Adherent Slough Tendon Exposed: No Muscle Exposed: No Joint  Exposed: No Bone Exposed: No Electronic Signature(s) Signed: 03/25/2022 9:51:02 AM By: Carlene Coria RN Entered By: Carlene Coria on 03/25/2022 [place], Theresa Warren ([id number]) -------------------------------------------------------------------------------- Wound Assessment Details Patient Name: Theresa Most D. Date of Service: 03/25/2022 8:45 AM Medical Record Number: [id number] Patient Account Number: [id number] Date of Birth/Sex: 1956-04-23 (66 y.o. F) Treating RN: Carlene Coria Primary Care Alanmichael Barmore: Crist Infante Other Clinician: Referring Athena Baltz: Rica Koyanagi Treating Tashe Purdon/Extender: Yaakov Guthrie in Treatment: 0 Wound Status Wound Number: 2 Primary Etiology: Calciphylaxis Wound Location: Right, Distal, Medial Upper Leg Wound Status: Open Wounding Event: Gradually Appeared Date Acquired: 12/01/2021 Weeks Of Treatment: 0 Clustered Wound: No Photos Wound Measurements Length: (cm) 2 Width: (cm) 3 Depth: (cm) 0.7 Area: (cm) 4.712 Volume: (cm) 3.299 % Reduction in Area: % Reduction in Volume: Epithelialization: None Tunneling: No Undermining: No Wound Description Classification: Full Thickness Without Exposed Support Structu Exudate Amount: Medium Exudate Type: Serosanguineous Exudate Color: red, brown res Foul Odor After Cleansing: No Slough/Fibrino Yes Wound Bed Granulation Amount: Small (1-33%) Exposed Structure Granulation Quality: Red Fascia Exposed: No Necrotic Amount: Large (67-100%) Fat Layer (Subcutaneous Tissue) Exposed: Yes Necrotic Quality: Eschar, Adherent Slough Tendon Exposed: No Muscle Exposed: No Joint Exposed: No Bone Exposed: No Electronic Signature(s) Signed: 03/25/2022 9:39:29 AM By: Carlene Coria RN Entered By: Carlene Coria  on 03/25/2022 09:39:29 SASHA, ROGEL ([id number]) -------------------------------------------------------------------------------- Wound Assessment Details Patient Name: SANSA, ALKEMA. Date of Service: 03/25/2022 8:45 AM Medical Record Number: [id number] Patient Account Number: [id number] Date of Birth/Sex: Sep 14, 1955 (66 y.o. F) Treating RN: Carlene Coria Primary Care Jaella Weinert: Crist Infante Other Clinician: Referring Adelena Desantiago: Rica Koyanagi Treating Mckell Riecke/Extender: Yaakov Guthrie in Treatment: 0 Wound Status Wound Number: 3 Primary Etiology: Calciphylaxis Wound Location: Right, Proximal, Medial Upper Leg Wound Status: Open Wounding Event: Gradually Appeared Date Acquired: 12/01/2021 Weeks Of Treatment: 0 Clustered Wound: No Photos Wound Measurements Length: (cm) 3.2 Width: (cm) 3.7 Depth: (cm) 0.3 Area: (cm) 9.299 Volume: (cm) 2.79 % Reduction in Area: % Reduction in Volume: Epithelialization: None Tunneling: No Undermining: No Wound Description Classification: Full Thickness Without Exposed Support Struc Exudate Amount: Medium Exudate Type: Serosanguineous Exudate Color: red, brown tures Foul Odor After Cleansing: No Slough/Fibrino Yes Wound Bed Necrotic Amount: Large (67-100%) Exposed Structure Necrotic Quality: Eschar, Adherent Slough Fascia Exposed: No Fat Layer (Subcutaneous Tissue) Exposed: No Tendon Exposed: No Muscle Exposed: No Joint Exposed: No Bone Exposed: No Electronic Signature(s) Signed: 03/25/2022 9:40:59 AM By: Carlene Coria RN Entered By: Carlene Coria on 03/25/2022 09:40:59 Warren, Theresa Warren ([id number]) -------------------------------------------------------------------------------- Wound Assessment Details Patient Name: Theresa Most D. Date of Service: 03/25/2022 8:45 AM Medical Record Number: [id number] Patient Account Number: [id number] Date of Birth/Sex: [birth year] (66 y.o. F) Treating RN: Carlene Coria Primary Care Kataleah Bejar: Crist Infante Other Clinician: Referring Alfonza Toft: Rica Koyanagi Treating Wavie Hashimi/Extender: Yaakov Guthrie in Treatment: 0 Wound Status Wound Number:  4 Primary Etiology: Calciphylaxis Wound Location: Right, Lateral Upper Leg Wound Status: Open Wounding Event: Gradually Appeared Date Acquired: 12/01/2021 Weeks Of Treatment: 0 Clustered Wound: No Photos Wound Measurements Length: (cm) 7.5 Width: (cm) 8 Depth: (cm) 0.5 Area: (cm) 47.124 Volume: (cm) 23.562 % Reduction in Area: % Reduction in Volume: Tunneling: No Undermining: No Wound Description Classification: Full Thickness Without Exposed Support Structu Exudate Amount: Medium Exudate Type: Serosanguineous Exudate Color: red, brown res Foul Odor After Cleansing: No Slough/Fibrino Yes Wound Bed Granulation Amount: None Present (0%) Exposed Structure Necrotic Amount: Large (67-100%) Fascia Exposed: No Necrotic Quality: Eschar, Adherent Slough Fat Layer (Subcutaneous Tissue) Exposed: Yes Tendon Exposed: No Muscle Exposed: No Joint Exposed: No Bone Exposed: No Electronic Signature(s) Signed: 03/25/2022 9:42:19 AM By: Carlene Coria RN Entered By: Carlene Coria on 03/25/2022 09:42:19 Warren, Theresa Warren ([id number]) -------------------------------------------------------------------------------- Wound Assessment Details Patient Name: Theresa Most D. Date of Service: 03/25/2022 8:45 AM Medical Record Number: [id number] Patient Account Number: [id number] Date of Birth/Sex: [birth year] (66 y.o. F) Treating RN: Carlene Coria Primary Care Amneet Cendejas: Crist Infante Other Clinician: Referring Devyne Hauger: Rica Koyanagi Treating Deontez Klinke/Extender: Yaakov Guthrie in Treatment: 0 Wound Status Wound Number: 5 Primary Etiology: Pressure Ulcer Wound Location: Right Gluteus Wound Status: Open Wounding Event: Gradually Appeared Date Acquired: 08/03/2021 Weeks Of Treatment: 0 Clustered Wound: Yes Photos Wound Measurements Length: (cm) 30 Width: (cm) 21 Depth: (cm) 0.5 Area: (cm) 494.801 Volume: (cm) 247.4 % Reduction in Area: 0% % Reduction in Volume:  0% Epithelialization: None Tunneling: No Undermining: No Wound Description Classification: Unstageable/Unclassified Exudate Amount: Medium Exudate Type: Serosanguineous Exudate Color: red, brown Foul Odor After Cleansing: No Slough/Fibrino Yes Wound Bed Granulation Amount: Small (1-33%) Exposed Structure Granulation Quality: Red, Pink Fascia Exposed: No Necrotic Amount: Large (67-100%) Fat Layer (Subcutaneous Tissue) Exposed: No Necrotic Quality: Eschar, Adherent Slough Tendon Exposed: No Muscle Exposed: No Joint Exposed: No Bone Exposed: No Electronic Signature(s) Unsigned Previous Signature: 03/25/2022 9:43:58 AM Version By:  Carlene Coria RN Entered By: Kalman Shan on 03/25/2022 10:35:08 Signature(s): Theresa, Warren ([id number]) Date(s): -------------------------------------------------------------------------------- Wound Assessment Details Patient Name: MYRIAH, BOGGUS. Date of Service: 03/25/2022 8:45 AM Medical Record Number: [id number] Patient Account Number: [id number] Date of Birth/Sex: 10-16-1955 (66 y.o. F) Treating RN: Carlene Coria Primary Care Estephany Perot: Crist Infante Other Clinician: Referring Bellami Farrelly: Rica Koyanagi Treating Capria Cartaya/Extender: Yaakov Guthrie in Treatment: 0 Wound Status Wound Number: 6 Primary Etiology: Pressure Ulcer Wound Location: Sacrum Wound Status: Open Wounding Event: Hematoma Date Acquired: 08/03/2021 Weeks Of Treatment: 0 Clustered Wound: No Photos Wound Measurements Length: (cm) 6 Width: (cm) 6 Depth: (cm) 0.7 Area: (cm) 28.274 Volume: (cm) 19.792 % Reduction in Area: 0% % Reduction in Volume: 0% Epithelialization: None Tunneling: No Undermining: No Wound Description Classification: Category/Stage III Exudate Amount: Medium Exudate Type: Serosanguineous Exudate Color: red, brown Foul Odor After Cleansing: No Slough/Fibrino Yes Wound Bed Granulation Amount: Small (1-33%) Exposed  Structure Granulation Quality: Red, Pink Fascia Exposed: No Necrotic Amount: Large (67-100%) Fat Layer (Subcutaneous Tissue) Exposed: Yes Necrotic Quality: Eschar, Adherent Slough Tendon Exposed: No Muscle Exposed: No Joint Exposed: No Bone Exposed: No Electronic Signature(s) Unsigned Previous Signature: 03/25/2022 9:45:25 AM Version By: Carlene Coria RN Entered By: Kalman Shan on 03/25/2022 10:34:41 Signature(s): Theresa Warren ([id number]) Date(s): -------------------------------------------------------------------------------- Wound Assessment Details Patient Name: Theresa Most D. Date of Service: 03/25/2022 8:45 AM Medical Record Number: [id number] Patient Account Number: [id number] Date of Birth/Sex: 1955-11-10 (66 y.o. F) Treating RN: Carlene Coria Primary Care Joshawa Dubin: Crist Infante Other Clinician: Referring Shammara Jarrett: Rica Koyanagi Treating Nuri Larmer/Extender: Yaakov Guthrie in Treatment: 0 Wound Status Wound Number: 7 Primary Etiology: Pressure Ulcer Wound Location: Left Gluteus Wound Status: Open Wounding Event: Gradually Appeared Date Acquired: 08/03/2021 Weeks Of Treatment: 0 Clustered Wound: Yes Photos Wound Measurements Length: (cm) 20 Width: (cm) 28 Depth: (cm) 0.7 Area: (cm) 439.823 Volume: (cm) 307.876 % Reduction in Area: 0% % Reduction in Volume: 0% Epithelialization: None Tunneling: No Wound Description Classification: Unstageable/Unclassified Exudate Amount: Medium Exudate Type: Serosanguineous Exudate Color: red, brown Foul Odor After Cleansing: No Slough/Fibrino Yes Wound Bed Granulation Amount: Small (1-33%) Exposed Structure Granulation Quality: Red, Pink Fascia Exposed: No Necrotic Amount: Large (67-100%) Fat Layer (Subcutaneous Tissue) Exposed: No Necrotic Quality: Eschar, Adherent Slough Tendon Exposed: No Muscle Exposed: No Joint Exposed: No Bone Exposed: No Electronic Signature(s) Unsigned Previous  Signature: 03/25/2022 9:47:15 AM Version By: Carlene Coria RN Entered By: Kalman Shan on 03/25/2022 10:33:58 Signature(s): Theresa Warren ([id number]) Date(s): -------------------------------------------------------------------------------- Wound Assessment Details Patient Name: Theresa Most D. Date of Service: 03/25/2022 8:45 AM Medical Record Number: [id number] Patient Account Number: [id number] Date of Birth/Sex: Feb 07, 1956 (66 y.o. F) Treating RN: Carlene Coria Primary Care Alanys Godino: Crist Infante Other Clinician: Referring Rafael Quesada: Rica Koyanagi Treating Makya Yurko/Extender: Yaakov Guthrie in Treatment: 0 Wound Status Wound Number: 8 Primary Etiology: Pressure Ulcer Wound Location: Left, Distal Gluteus Wound Status: Open Wounding Event: Gradually Appeared Date Acquired: 12/01/2021 Weeks Of Treatment: 0 Clustered Wound: No Photos Wound Measurements Length: (cm) 9 Width: (cm) 5 Depth: (cm) 0.8 Area: (cm) 35.343 Volume: (cm) 28.274 % Reduction in Area: 0% % Reduction in Volume: 0% Epithelialization: None Tunneling: No Undermining: No Wound Description Classification: Unstageable/Unclassified Exudate Amount: Medium Exudate Type: Serosanguineous Exudate Color: red, brown Foul Odor After Cleansing: No Slough/Fibrino Yes Wound Bed Granulation Amount: Medium (34-66%) Exposed Structure Granulation Quality: Red, Pink Fascia Exposed: No Necrotic Amount: Medium (34-66%) Fat Layer (Subcutaneous Tissue) Exposed: No Necrotic Quality: Adherent Slough Tendon Exposed: No Muscle Exposed:  No Joint Exposed: No Bone Exposed: No Electronic Signature(s) Unsigned Previous Signature: 03/25/2022 9:48:39 AM Version By: Carlene Coria RN Entered By: Kalman Shan on 03/25/2022 10:34:14 Signature(s): DAMYIAH, MOXLEY ([id number]) Date(s): -------------------------------------------------------------------------------- Wound Assessment Details Patient Name:  Theresa, ODANIEL D. Date of Service: 03/25/2022 8:45 AM Medical Record Number: [id number] Patient Account Number: [id number] Date of Birth/Sex: [birth year] (66 y.o. F) Treating RN: Carlene Coria Primary Care Bishoy Cupp: Crist Infante Other Clinician: Referring Xiong Haidar: Rica Koyanagi Treating Brei Pociask/Extender: Yaakov Guthrie in Treatment: 0 Wound Status Wound Number: 9 Primary Etiology: Calciphylaxis Wound Location: Left Upper Leg Wound Status: Open Wounding Event: Gradually Appeared Date Acquired: 12/01/2021 Weeks Of Treatment: 0 Clustered Wound: Yes Photos Wound Measurements Length: (cm) 18 Width: (cm) 30 Depth: (cm) 0.5 Area: (cm) 424.115 Volume: (cm) 212.058 % Reduction in Area: % Reduction in Volume: Epithelialization: None Tunneling: No Undermining: No Wound Description Classification: Full Thickness Without Exposed Support Structu Exudate Amount: Medium Exudate Type: Serosanguineous Exudate Color: red, brown res Foul Odor After Cleansing: No Slough/Fibrino Yes Wound Bed Granulation Amount: None Present (0%) Exposed Structure Necrotic Amount: Large (67-100%) Fascia Exposed: No Necrotic Quality: Eschar, Adherent Slough Fat Layer (Subcutaneous Tissue) Exposed: No Tendon Exposed: No Muscle Exposed: No Joint Exposed: No Bone Exposed: No Electronic Signature(s) Signed: 03/25/2022 9:49:53 AM By: Carlene Coria RN Entered By: Carlene Coria on 03/25/2022 09:49:53 Warren, Theresa Warren ([id number]) -------------------------------------------------------------------------------- Vitals Details Patient Name: Theresa Most D. Date of Service: 03/25/2022 8:45 AM Medical Record Number: [id number] Patient Account Number: [id number] Date of Birth/Sex: 12/16/1955 (66 y.o. F) Treating RN: Carlene Coria Primary Care Hawley Michel: Crist Infante Other Clinician: Referring Milfred Krammes: Rica Koyanagi Treating Auguste Tebbetts/Extender: Yaakov Guthrie in Treatment: 0 Vital  Signs Time Taken: 08:47 Temperature (F): 98.4 Height (in): 62 Pulse (bpm): 109 Source: Stated Respiratory Rate (breaths/min): 20 Weight (lbs): 286 Blood Pressure (mmHg): 134/75 Body Mass Index (BMI): 52.3 Reference Range: 80 - 120 mg / dl Electronic Signature(s) Signed: 03/25/2022 9:35:51 AM By: Carlene Coria RN Entered By: Carlene Coria on 03/25/2022 09:35:50

## 2022-03-26 NOTE — Progress Notes (Signed)
Theresa Warren, Theresa Warren ([id number]) Visit Report for 03/25/2022 Chief Complaint Document Details Patient Name: Theresa Warren, Theresa Warren. Date of Service: 03/25/2022 8:45 AM Medical Record Number: [id number] Patient Account Number: [id number] Date of Birth/Sex: 1955-12-07 (66 y.o. F) Treating RN: Carlene Coria Primary Care Provider: Crist Infante Other Clinician: Referring Provider: Rica Koyanagi Treating Provider/Extender: Yaakov Guthrie in Treatment: 0 Information Obtained from: Patient Chief Complaint 03/25/2022; multiple extensive wounds throughout the buttocks, sacrum and lower extremities bilaterally Electronic Signature(s) Signed: 03/25/2022 11:44:26 AM By: Kalman Shan DO Entered By: Kalman Shan on 03/25/2022 10:49:42 Spratling, Theresa Warren ([id number]) -------------------------------------------------------------------------------- HPI Details Patient Name: Theresa Most D. Date of Service: 03/25/2022 8:45 AM Medical Record Number: [id number] Patient Account Number: [id number] Date of Birth/Sex: Dec 23, 1955 (66 y.o. F) Treating RN: Carlene Coria Primary Care Provider: Crist Infante Other Clinician: Referring Provider: Rica Koyanagi Treating Provider/Extender: Yaakov Guthrie in Treatment: 0 History of Present Illness HPI Description: Admission 03/25/2022 [name] is a 66 year old female with a past medical history of uncontrolled insulin-dependent type 2 diabetes, end-stage renal disease on hemodialysis, severe protein malnutrition with albumin of 1.6 and total protein of 5.7 that presents the clinic for Multiple extensive wounds throughout her lower extremities and buttocks for the past 3 months. She states the lower extremity wounds happened spontaneously and she thinks the buttocks wounds are from pressure. She is mostly in bed all day. She attributes this due to muscle weakness. She has no medical condition keeping her bed bound. She resides at [facility]. Patient has been admitted to the hospital on 4 different occasions in the past 3 months. 3 of these admissions were related to Complications of her wounds. On 03/13/2022 She was admitted for altered mental status secondary to bacteremia With her open wounds being the source. On 02/22/2022 she was hospitalized for sepsis secondary to necrotic sacral decubitus ulcers. 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 On 01/14/2022 She had a biopsy of her left outer and inner thigh that was nonspecific. She has had OR debridement of her buttocks wounds on 01/01/2022 by general surgery And had a wound VAC in place for 1 to 2 weeks. Despite this most of her wounds have eschar throughout. She has been using Dakin's wet-to-dry dressings. She has developed a right heel wound over the past 1 to 2 weeks. She does not have Prevalon boots. She currently denies systemic signs of infection. Electronic Signature(s) Signed: 03/25/2022 11:44:26 AM By: Kalman Shan DO Entered By: Kalman Shan on 03/25/2022 11:31:04 Theresa Warren, Theresa Warren ([id number]) -------------------------------------------------------------------------------- Physical Exam Details Patient Name: [name], [name] Date of Service: 03/25/2022 8:45 AM Medical Record Number: [id number] Patient Account Number: [id number] Date of Birth/Sex: 1956/06/28 (66 y.o. F) Treating RN: Carlene Coria Primary Care Provider: Crist Infante Other Clinician: Referring Provider: Rica Koyanagi Treating Provider/Extender: Yaakov Guthrie in Treatment: 0 Constitutional . Cardiovascular . Psychiatric . Notes Multiple extensive wounds to the right and left buttocks, sacrum and lower extremities. All wounds except for some on the sacrum have eschar throughout. She does have a small pressure injury to the right heel with some skin breakdown. No surrounding signs of soft tissue  infection including increased warmth, erythema or purulent drainage. Electronic Signature(s) Signed: 03/25/2022 11:44:26 AM By: Kalman Shan DO Entered By: Kalman Shan on 03/25/2022 11:31:30 Theresa Warren, Theresa Warren ([id number]) -------------------------------------------------------------------------------- Physician Orders Details Patient Name: Theresa Most D. Date of Service: 03/25/2022 8:45 AM Medical Record Number: [id number] Patient Account Number: [id number] Date of  Birth/Sex: 06/27/1956 (66 y.o. F) Treating RN: Carlene Coria Primary Care Provider: Crist Infante Other Clinician: Referring Provider: Rica Koyanagi Treating Provider/Extender: Yaakov Guthrie in Treatment: 0 Verbal / Phone Orders: No Diagnosis Coding ICD-10 Coding Code Description L97.818 Non-pressure chronic ulcer of other part of right lower leg with other specified severity L97.828 Non-pressure chronic ulcer of other part of left lower leg with other specified severity E11.622 Type 2 diabetes mellitus with other skin ulcer N18.6 End stage renal disease E83.59 Other disorders of calcium metabolism L89.613 Pressure ulcer of right heel, stage 3 E11.621 Type 2 diabetes mellitus with foot ulcer L89.310 Pressure ulcer of right buttock, unstageable L89.320 Pressure ulcer of left buttock, unstageable Discharge From [place] Only - wound care orders , clean with normal saline , pat dry , apply 1/4 dakins solution to moisten kerlix and apply to wound bed only, cover with ABD pads and secure with tape , daily. on all wounds except right heel, wound care order clean with normal saline , pat dry, apply medihoney to wound bed and cover with dry dressing. patient to wear prevalon boots at all times unless patient is transferring . patient to have PT to eval for decreased strength . Dr Heber [place] is referring the patient to Dr Jaclynn Guarneri at [facility]  , [phone]. fax [phone]. This office will send all information to Dr Jaclynn Guarneri and have them contact the facility to schedule appointment . patient to follow up with this wound center as needed . Wound Treatment Electronic Signature(s) Signed: 03/25/2022 1:39:42 PM By: Kalman Shan DO Signed: 03/26/2022 4:19:23 PM By: Carlene Coria RN Previous Signature: 03/25/2022 11:53:21 AM Version By: Carlene Coria RN Previous Signature: 03/25/2022 11:44:26 AM Version By: Kalman Shan DO Entered By: Carlene Coria on 03/25/2022 11:58:47 Theresa Warren, Theresa Warren ([id number]) -------------------------------------------------------------------------------- Problem List Details Patient Name: CATERRA, OSTROFF. Date of Service: 03/25/2022 8:45 AM Medical Record Number: [id number] Patient Account Number: [id number] Date of Birth/Sex: 02/10/1956 (66 y.o. F) Treating RN: Carlene Coria Primary Care Provider: Crist Infante Other Clinician: Referring Provider: Rica Koyanagi Treating Provider/Extender: Yaakov Guthrie in Treatment: 0 Active Problems ICD-10 Encounter Code Description Active Date MDM Diagnosis L97.818 Non-pressure chronic ulcer of other part of right lower leg with other 03/25/2022 No Yes specified severity L97.828 Non-pressure chronic ulcer of other part of left lower leg with other 03/25/2022 No Yes specified severity E11.622 Type 2 diabetes mellitus with other skin ulcer 03/25/2022 No Yes N18.6 End stage renal disease 03/25/2022 No Yes E83.59 Other disorders of calcium metabolism 03/25/2022 No Yes L89.613 Pressure ulcer of right heel, stage 3 03/25/2022 No Yes E11.621 Type 2 diabetes mellitus with foot ulcer 03/25/2022 No Yes L89.310 Pressure ulcer of right buttock, unstageable 03/25/2022 No Yes L89.320 Pressure ulcer of left buttock, unstageable 03/25/2022 No Yes Inactive Problems Resolved Problems Electronic Signature(s) Signed: 03/25/2022 11:44:26 AM By: Kalman Shan DO Entered  By: Kalman Shan on 03/25/2022 10:49:03 Theresa Warren, Theresa Warren ([id number]) -------------------------------------------------------------------------------- Progress Note Details Patient Name: Theresa Most D. Date of Service: 03/25/2022 8:45 AM Medical Record Number: [id number] Patient Account Number: [id number] Date of Birth/Sex: 06/21/1956 (66 y.o. F) Treating RN: Carlene Coria Primary Care Provider: Crist Infante Other Clinician: Referring Provider: Rica Koyanagi Treating Provider/Extender: Yaakov Guthrie in Treatment: 0 Subjective Chief Complaint Information obtained from Patient 03/25/2022; multiple extensive wounds throughout the buttocks, sacrum and lower extremities bilaterally History of Present Illness (HPI) Admission 03/25/2022 [name] is a  66 year old female with a past medical history of uncontrolled insulin-dependent type 2 diabetes, end-stage renal disease on hemodialysis, severe protein malnutrition with albumin of 1.6 and total protein of 5.7 that presents the clinic for Multiple extensive wounds throughout her lower extremities and buttocks for the past 3 months. She states the lower extremity wounds happened spontaneously and she thinks the buttocks wounds are from pressure. She is mostly in bed all day. She attributes this due to muscle weakness. She has no medical condition keeping her bed bound. She resides at [facility]. Patient has been admitted to the hospital on 4 different occasions in the past 3 months. 3 of these admissions were related to Complications of her wounds. On 03/13/2022 She was admitted for altered mental status secondary to bacteremia With her open wounds being the source. On 02/22/2022 she was hospitalized for sepsis secondary to necrotic sacral decubitus ulcers. 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 On 01/14/2022 She had a biopsy of her left outer and  inner thigh that was nonspecific. She has had OR debridement of her buttocks wounds on 01/01/2022 by general surgery And had a wound VAC in place for 1 to 2 weeks. Despite this most of her wounds have eschar throughout. She has been using Dakin's wet-to-dry dressings. She has developed a right heel wound over the past 1 to 2 weeks. She does not have Prevalon boots. She currently denies systemic signs of infection. Patient History Allergies Sulfamide, clindamycin, Dilaudid, codeine, penicillin, carvedilol, pioglitazone Social History Never smoker, Marital Status - Single, Alcohol Use - Never, Drug Use - No History, Caffeine Use - Never. Medical History Cardiovascular Patient has history of Hypertension Endocrine Patient has history of Type II Diabetes Genitourinary Patient has history of End Stage Renal Disease Patient is treated with Insulin. Review of Systems (ROS) Genitourinary Complains or has symptoms of Kidney failure/ Dialysis. Integumentary (Skin) Complains or has symptoms of Wounds, Swelling. Objective Constitutional Vitals Time Taken: 8:47 AM, Height: 62 in, Source: Stated, Weight: 286 lbs, BMI: 52.3, Temperature: 98.4 F, Pulse: 109 bpm, Respiratory Rate: 20 breaths/min, Blood Pressure: 134/75 mmHg. General Notes: Multiple extensive wounds to the right and left buttocks, sacrum and lower extremities. All wounds except for some on the sacrum have eschar throughout. She does have a small pressure injury to the right heel with some skin breakdown. No surrounding signs of soft tissue infection including increased warmth, erythema or purulent drainage. Theresa Warren, Theresa Warren ([id number]) Integumentary (Hair, Skin) Wound #1 status is Open. Original cause of wound was Gradually Appeared. The date acquired was: 01/01/2022. The wound is located on the Right Calcaneus. The wound measures 1.3cm length x 1.5cm width x 0.1cm depth; 1.532cm^2 area and 0.153cm^3 volume. There is Fat  Layer (Subcutaneous Tissue) exposed. There is no tunneling or undermining noted. There is a medium amount of serosanguineous drainage noted. There is medium (34-66%) red, pink granulation within the wound bed. There is a medium (34-66%) amount of necrotic tissue within the wound bed including Adherent Slough. Wound #10 status is Open. Original cause of wound was Gradually Appeared. The date acquired was: 12/01/2021. The wound is located on the Left,Posterior Lower Leg. The wound measures 9cm length x 7cm width x 0.4cm depth; 49.48cm^2 area and 19.792cm^3 volume. There is Fat Layer (Subcutaneous Tissue) exposed. There is no tunneling or undermining noted. There is a medium amount of serosanguineous drainage noted. There is small (1-33%) pink granulation within the wound bed. There is a  large (67-100%) amount of necrotic tissue within the wound bed including Eschar and Adherent Slough. Wound #2 status is Open. Original cause of wound was Gradually Appeared. The date acquired was: 12/01/2021. The wound is located on the Right,Distal,Medial Upper Leg. The wound measures 2cm length x 3cm width x 0.7cm depth; 4.712cm^2 area and 3.299cm^3 volume. There is Fat Layer (Subcutaneous Tissue) exposed. There is no tunneling or undermining noted. There is a medium amount of serosanguineous drainage noted. There is small (1-33%) red granulation within the wound bed. There is a large (67-100%) amount of necrotic tissue within the wound bed including Eschar and Adherent Slough. Wound #3 status is Open. Original cause of wound was Gradually Appeared. The date acquired was: 12/01/2021. The wound is located on the Right,Proximal,Medial Upper Leg. The wound measures 3.2cm length x 3.7cm width x 0.3cm depth; 9.299cm^2 area and 2.79cm^3 volume. There is no tunneling or undermining noted. There is a medium amount of serosanguineous drainage noted. There is a large (67-100%) amount of necrotic tissue within the wound bed including  Eschar and Adherent Slough. Wound #4 status is Open. Original cause of wound was Gradually Appeared. The date acquired was: 12/01/2021. The wound is located on the Right,Lateral Upper Leg. The wound measures 7.5cm length x 8cm width x 0.5cm depth; 47.124cm^2 area and 23.562cm^3 volume. There is Fat Layer (Subcutaneous Tissue) exposed. There is no tunneling or undermining noted. There is a medium amount of serosanguineous drainage noted. There is no granulation within the wound bed. There is a large (67-100%) amount of necrotic tissue within the wound bed including Eschar and Adherent Slough. Wound #5 status is Open. Original cause of wound was Gradually Appeared. The date acquired was: 08/03/2021. The wound is located on the Right Gluteus. The wound measures 30cm length x 21cm width x 0.5cm depth; 494.801cm^2 area and 247.4cm^3 volume. There is no tunneling or undermining noted. There is a medium amount of serosanguineous drainage noted. There is small (1-33%) red, pink granulation within the wound bed. There is a large (67-100%) amount of necrotic tissue within the wound bed including Eschar and Adherent Slough. Wound #6 status is Open. Original cause of wound was Hematoma. The date acquired was: 08/03/2021. The wound is located on the Sacrum. The wound measures 6cm length x 6cm width x 0.7cm depth; 28.274cm^2 area and 19.792cm^3 volume. There is Fat Layer (Subcutaneous Tissue) exposed. There is no tunneling or undermining noted. There is a medium amount of serosanguineous drainage noted. There is small (1-33%) red, pink granulation within the wound bed. There is a large (67-100%) amount of necrotic tissue within the wound bed including Eschar and Adherent Slough. Wound #7 status is Open. Original cause of wound was Gradually Appeared. The date acquired was: 08/03/2021. The wound is located on the Left Gluteus. The wound measures 20cm length x 28cm width x 0.7cm depth; 439.823cm^2 area and 307.876cm^3  volume. There is no tunneling noted. There is a medium amount of serosanguineous drainage noted. There is small (1-33%) red, pink granulation within the wound bed. There is a large (67-100%) amount of necrotic tissue within the wound bed including Eschar and Adherent Slough. Wound #8 status is Open. Original cause of wound was Gradually Appeared. The date acquired was: 12/01/2021. The wound is located on the Left,Distal Gluteus. The wound measures 9cm length x 5cm width x 0.8cm depth; 35.343cm^2 area and 28.274cm^3 volume. There is no tunneling or undermining noted. There is a medium amount of serosanguineous drainage noted. There is medium (34-66%) red,  pink granulation within the wound bed. There is a medium (34-66%) amount of necrotic tissue within the wound bed including Adherent Slough. Wound #9 status is Open. Original cause of wound was Gradually Appeared. The date acquired was: 12/01/2021. The wound is located on the Left Upper Leg. The wound measures 18cm length x 30cm width x 0.5cm depth; 424.115cm^2 area and 212.058cm^3 volume. There is no tunneling or undermining noted. There is a medium amount of serosanguineous drainage noted. There is no granulation within the wound bed. There is a large (67-100%) amount of necrotic tissue within the wound bed including Eschar and Adherent Slough. 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 ([id number]) Patient presents with a 74-month history of nonhealing ulcers to her lower extremities and  buttocks/sacrum. The lower extremity wounds may be be due to calciphylaxis However etiology is not clear. A biopsy to the thigh showed nonspecific findings. The buttocks wounds appear to be pressure related. She sits all day in the bed or in a chair. She is starting to develop a pressure ulcer to her right heel. No signs of infection to any of the wounds or surrounding tissue. At this time I recommended aggressive offloading and Prevalon boots to the feet. She reports muscle weakness and has trouble repositioning due to decreased upper extremity strength. I recommended physical therapy and we will request this from her facility. She has extensive necrotic debris throughout most her wounds. She would benefit From a center that is better connected to plastic surgery, dermatology and general surgery. She will likely require OR debridement for these wounds. I will refer her to [facility] wound care center as they have wound care and plastic surgery available. For now I recommended Dakin's wet-to-dry dressings to all wounds except the heel and she can use Medihoney here. She knows to call with any questions or concerns. She may follow-up as needed. 58 minutes was spent on the encounter including face-to-face, EMR review and coordination of care Plan 1. Dakin's wet-to-dry dressings 2. Medihoney to the right heel 3. Prevalon boots 4. Aggressive offloading reposition every 1-2 hours 5. Referral to [facility] wound care center/plastic surgery 6. Follow-up as needed Electronic Signature(s) Signed: 03/25/2022 11:44:26 AM By: Kalman Shan DO Entered By: Kalman Shan on 03/25/2022 11:43:29 Theresa Warren, Theresa Warren ([id number]) -------------------------------------------------------------------------------- ROS/PFSH Details Patient Name: Theresa Most D. Date of Service: 03/25/2022 8:45 AM Medical Record Number: [id number] Patient Account Number: [id number] Date of Birth/Sex: March 15, 1956  (66 y.o. F) Treating RN: Carlene Coria Primary Care Provider: Crist Infante Other Clinician: Referring Provider: Rica Koyanagi Treating Provider/Extender: Yaakov Guthrie in Treatment: 0 Genitourinary Complaints and Symptoms: Positive for: Kidney failure/ Dialysis Medical History: Positive for: End Stage Renal Disease Integumentary (Skin) Complaints and Symptoms: Positive for: Wounds; Swelling Cardiovascular Medical History: Positive for: Hypertension Endocrine Medical History: Positive for: Type II Diabetes Treated with: Insulin Immunizations Pneumococcal Vaccine: Received Pneumococcal Vaccination: No Implantable Devices None Family and Social History Never smoker; Marital Status - Single; Alcohol Use: Never; Drug Use: No History; Caffeine Use: Never Electronic Signature(s) Signed: 03/25/2022 11:44:26 AM By: Kalman Shan  DO Signed: 03/26/2022 4:19:23 PM By: Carlene Coria RN Entered By: Carlene Coria on 03/25/2022 11:27:28 Theresa Warren, Theresa Warren ([id number]) -------------------------------------------------------------------------------- SuperBill Details Patient Name: Theresa Most D. Date of Service: 03/25/2022 Medical Record Number: [id number] Patient Account Number: [id number] Date of Birth/Sex: 23-Apr-1956 (66 y.o. F) Treating RN: Carlene Coria Primary Care Provider: Crist Infante Other Clinician: Referring Provider: Rica Koyanagi Treating Provider/Extender: Yaakov Guthrie in Treatment: 0 Diagnosis Coding ICD-10 Codes Code Description [phone] Non-pressure chronic ulcer of other part of right lower leg with other specified severity L97.828 Non-pressure chronic ulcer of other part of left lower leg with other specified severity E11.622 Type 2 diabetes mellitus with other skin ulcer N18.6 End stage renal disease E83.59 Other disorders of calcium metabolism L89.613 Pressure ulcer of right heel, stage 3 E11.621 Type 2 diabetes mellitus with foot  ulcer L89.310 Pressure ulcer of right buttock, unstageable L89.320 Pressure ulcer of left buttock, unstageable Facility Procedures CPT4 Code: [id number] Description: 68032 - WOUND CARE VISIT-LEV 5 EST PT Modifier: Quantity: 1 Physician Procedures CPT4 Code: 1224825 Description: 00370 - WC PHYS LEVEL 4 - NEW PT Modifier: Quantity: 1 CPT4 Code: Description: ICD-10 Diagnosis Description L97.818 Non-pressure chronic ulcer of other part of right lower leg with other spec L97.828 Non-pressure chronic ulcer of other part of left lower leg with other speci E11.622 Type 2 diabetes mellitus with other  skin ulcer L89.310 Pressure ulcer of right buttock, unstageable Modifier: ified severity fied severity Quantity: Electronic Signature(s) Signed: 03/25/2022 11:55:16 AM By: Carlene Coria RN Signed: 03/25/2022 1:39:42 PM By: Kalman Shan DO Previous Signature: 03/25/2022 11:44:26 AM Version By: Kalman Shan DO Entered By: Carlene Coria on 03/25/2022 11:55:16

## 2022-03-27 ENCOUNTER — Inpatient Hospital Stay
Admission: EM | Admit: 2022-03-27 | Discharge: 2022-04-03 | DRG: 871 | Disposition: E | Payer: Medicare Other | Attending: Osteopathic Medicine | Admitting: Osteopathic Medicine

## 2022-03-27 ENCOUNTER — Emergency Department: Payer: Medicare Other

## 2022-03-27 ENCOUNTER — Telehealth: Payer: Self-pay

## 2022-03-27 DIAGNOSIS — I5023 Acute on chronic systolic (congestive) heart failure: Secondary | ICD-10-CM | POA: Diagnosis present

## 2022-03-27 DIAGNOSIS — L89310 Pressure ulcer of right buttock, unstageable: Secondary | ICD-10-CM | POA: Diagnosis present

## 2022-03-27 DIAGNOSIS — A419 Sepsis, unspecified organism: Principal | ICD-10-CM

## 2022-03-27 DIAGNOSIS — R0602 Shortness of breath: Secondary | ICD-10-CM | POA: Diagnosis not present

## 2022-03-27 DIAGNOSIS — E876 Hypokalemia: Secondary | ICD-10-CM | POA: Diagnosis present

## 2022-03-27 DIAGNOSIS — Z880 Allergy status to penicillin: Secondary | ICD-10-CM

## 2022-03-27 DIAGNOSIS — L89210 Pressure ulcer of right hip, unstageable: Secondary | ICD-10-CM | POA: Diagnosis present

## 2022-03-27 DIAGNOSIS — E1165 Type 2 diabetes mellitus with hyperglycemia: Secondary | ICD-10-CM | POA: Diagnosis not present

## 2022-03-27 DIAGNOSIS — L89890 Pressure ulcer of other site, unstageable: Secondary | ICD-10-CM | POA: Diagnosis present

## 2022-03-27 DIAGNOSIS — G894 Chronic pain syndrome: Secondary | ICD-10-CM | POA: Diagnosis present

## 2022-03-27 DIAGNOSIS — E785 Hyperlipidemia, unspecified: Secondary | ICD-10-CM | POA: Diagnosis present

## 2022-03-27 DIAGNOSIS — B957 Other staphylococcus as the cause of diseases classified elsewhere: Secondary | ICD-10-CM | POA: Diagnosis not present

## 2022-03-27 DIAGNOSIS — Z6837 Body mass index (BMI) 37.0-37.9, adult: Secondary | ICD-10-CM

## 2022-03-27 DIAGNOSIS — Z66 Do not resuscitate: Secondary | ICD-10-CM | POA: Diagnosis present

## 2022-03-27 DIAGNOSIS — E11649 Type 2 diabetes mellitus with hypoglycemia without coma: Secondary | ICD-10-CM | POA: Diagnosis present

## 2022-03-27 DIAGNOSIS — G4733 Obstructive sleep apnea (adult) (pediatric): Secondary | ICD-10-CM | POA: Diagnosis present

## 2022-03-27 DIAGNOSIS — E1122 Type 2 diabetes mellitus with diabetic chronic kidney disease: Secondary | ICD-10-CM | POA: Diagnosis present

## 2022-03-27 DIAGNOSIS — Z1629 Resistance to other single specified antibiotic: Secondary | ICD-10-CM | POA: Diagnosis present

## 2022-03-27 DIAGNOSIS — I96 Gangrene, not elsewhere classified: Secondary | ICD-10-CM | POA: Diagnosis present

## 2022-03-27 DIAGNOSIS — Z888 Allergy status to other drugs, medicaments and biological substances status: Secondary | ICD-10-CM

## 2022-03-27 DIAGNOSIS — J9601 Acute respiratory failure with hypoxia: Secondary | ICD-10-CM | POA: Diagnosis present

## 2022-03-27 DIAGNOSIS — D631 Anemia in chronic kidney disease: Secondary | ICD-10-CM | POA: Diagnosis present

## 2022-03-27 DIAGNOSIS — A411 Sepsis due to other specified staphylococcus: Secondary | ICD-10-CM | POA: Diagnosis present

## 2022-03-27 DIAGNOSIS — N186 End stage renal disease: Secondary | ICD-10-CM | POA: Diagnosis present

## 2022-03-27 DIAGNOSIS — Z885 Allergy status to narcotic agent status: Secondary | ICD-10-CM

## 2022-03-27 DIAGNOSIS — L89320 Pressure ulcer of left buttock, unstageable: Secondary | ICD-10-CM | POA: Diagnosis present

## 2022-03-27 DIAGNOSIS — R627 Adult failure to thrive: Secondary | ICD-10-CM | POA: Diagnosis present

## 2022-03-27 DIAGNOSIS — L89220 Pressure ulcer of left hip, unstageable: Secondary | ICD-10-CM | POA: Diagnosis present

## 2022-03-27 DIAGNOSIS — R7881 Bacteremia: Secondary | ICD-10-CM | POA: Diagnosis not present

## 2022-03-27 DIAGNOSIS — R6520 Severe sepsis without septic shock: Secondary | ICD-10-CM | POA: Diagnosis not present

## 2022-03-27 DIAGNOSIS — Z79899 Other long term (current) drug therapy: Secondary | ICD-10-CM

## 2022-03-27 DIAGNOSIS — Z881 Allergy status to other antibiotic agents status: Secondary | ICD-10-CM

## 2022-03-27 DIAGNOSIS — D649 Anemia, unspecified: Secondary | ICD-10-CM | POA: Diagnosis present

## 2022-03-27 DIAGNOSIS — Z992 Dependence on renal dialysis: Secondary | ICD-10-CM

## 2022-03-27 DIAGNOSIS — Z833 Family history of diabetes mellitus: Secondary | ICD-10-CM

## 2022-03-27 DIAGNOSIS — D72823 Leukemoid reaction: Secondary | ICD-10-CM | POA: Diagnosis not present

## 2022-03-27 DIAGNOSIS — L89150 Pressure ulcer of sacral region, unstageable: Secondary | ICD-10-CM | POA: Diagnosis present

## 2022-03-27 DIAGNOSIS — Z794 Long term (current) use of insulin: Secondary | ICD-10-CM

## 2022-03-27 DIAGNOSIS — J189 Pneumonia, unspecified organism: Secondary | ICD-10-CM | POA: Diagnosis present

## 2022-03-27 DIAGNOSIS — M7989 Other specified soft tissue disorders: Secondary | ICD-10-CM | POA: Diagnosis present

## 2022-03-27 DIAGNOSIS — Z515 Encounter for palliative care: Secondary | ICD-10-CM

## 2022-03-27 DIAGNOSIS — A4102 Sepsis due to Methicillin resistant Staphylococcus aureus: Secondary | ICD-10-CM | POA: Diagnosis present

## 2022-03-27 DIAGNOSIS — I132 Hypertensive heart and chronic kidney disease with heart failure and with stage 5 chronic kidney disease, or end stage renal disease: Secondary | ICD-10-CM | POA: Diagnosis present

## 2022-03-27 DIAGNOSIS — R6521 Severe sepsis with septic shock: Secondary | ICD-10-CM | POA: Diagnosis present

## 2022-03-27 DIAGNOSIS — D72829 Elevated white blood cell count, unspecified: Secondary | ICD-10-CM | POA: Diagnosis present

## 2022-03-27 DIAGNOSIS — Z8249 Family history of ischemic heart disease and other diseases of the circulatory system: Secondary | ICD-10-CM

## 2022-03-27 DIAGNOSIS — Z882 Allergy status to sulfonamides status: Secondary | ICD-10-CM

## 2022-03-27 DIAGNOSIS — I10 Essential (primary) hypertension: Secondary | ICD-10-CM | POA: Diagnosis present

## 2022-03-27 DIAGNOSIS — A4101 Sepsis due to Methicillin susceptible Staphylococcus aureus: Principal | ICD-10-CM | POA: Diagnosis present

## 2022-03-27 DIAGNOSIS — Z20822 Contact with and (suspected) exposure to covid-19: Secondary | ICD-10-CM | POA: Diagnosis present

## 2022-03-27 DIAGNOSIS — E162 Hypoglycemia, unspecified: Secondary | ICD-10-CM | POA: Diagnosis present

## 2022-03-27 DIAGNOSIS — R4182 Altered mental status, unspecified: Secondary | ICD-10-CM

## 2022-03-27 LAB — COMPREHENSIVE METABOLIC PANEL
ALT: 49 U/L — ABNORMAL HIGH (ref 0–44)
AST: 53 U/L — ABNORMAL HIGH (ref 15–41)
Albumin: 1.9 g/dL — ABNORMAL LOW (ref 3.5–5.0)
Alkaline Phosphatase: 295 U/L — ABNORMAL HIGH (ref 38–126)
Anion gap: 12 (ref 5–15)
BUN: 19 mg/dL (ref 8–23)
CO2: 26 mmol/L (ref 22–32)
Calcium: 9.2 mg/dL (ref 8.9–10.3)
Chloride: 106 mmol/L (ref 98–111)
Creatinine, Ser: 2.95 mg/dL — ABNORMAL HIGH (ref 0.44–1.00)
GFR, Estimated: 17 mL/min — ABNORMAL LOW (ref >60)
Glucose, Bld: 37 mg/dL — CL (ref 70–99)
Potassium: 3.2 mmol/L — ABNORMAL LOW (ref 3.5–5.1)
Sodium: 144 mmol/L (ref 135–145)
Total Bilirubin: 0.9 mg/dL (ref 0.3–1.2)
Total Protein: 6.4 g/dL — ABNORMAL LOW (ref 6.5–8.1)

## 2022-03-27 LAB — CBC WITH DIFFERENTIAL/PLATELET
Abs Immature Granulocytes: 0.16 10*3/uL — ABNORMAL HIGH (ref 0.00–0.07)
Basophils Absolute: 0.1 10*3/uL (ref 0.0–0.1)
Basophils Relative: 0 %
Eosinophils Absolute: 0 10*3/uL (ref 0.0–0.5)
Eosinophils Relative: 0 %
HCT: 32.6 % — ABNORMAL LOW (ref 36.0–46.0)
Hemoglobin: 9.9 g/dL — ABNORMAL LOW (ref 12.0–15.0)
Immature Granulocytes: 1 %
Lymphocytes Relative: 5 %
Lymphs Abs: 0.9 10*3/uL (ref 0.7–4.0)
MCH: 29.1 pg (ref 26.0–34.0)
MCHC: 30.4 g/dL (ref 30.0–36.0)
MCV: 95.9 fL (ref 80.0–100.0)
Monocytes Absolute: 0.3 10*3/uL (ref 0.1–1.0)
Monocytes Relative: 2 %
Neutro Abs: 16.2 10*3/uL — ABNORMAL HIGH (ref 1.7–7.7)
Neutrophils Relative %: 92 %
Platelets: 329 10*3/uL (ref 150–400)
RBC: 3.4 MIL/uL — ABNORMAL LOW (ref 3.87–5.11)
RDW: 17.2 % — ABNORMAL HIGH (ref 11.5–15.5)
Smear Review: NORMAL
WBC: 17.7 10*3/uL — ABNORMAL HIGH (ref 4.0–10.5)
nRBC: 0 % (ref 0.0–0.2)

## 2022-03-27 LAB — RESP PANEL BY RT-PCR (FLU A&B, COVID) ARPGX2
Influenza A by PCR: NEGATIVE
Influenza B by PCR: NEGATIVE
SARS Coronavirus 2 by RT PCR: NEGATIVE

## 2022-03-27 LAB — PROTIME-INR
INR: 1.3 — ABNORMAL HIGH (ref 0.8–1.2)
Prothrombin Time: 15.9 seconds — ABNORMAL HIGH (ref 11.4–15.2)

## 2022-03-27 LAB — BRAIN NATRIURETIC PEPTIDE: B Natriuretic Peptide: 291 pg/mL — ABNORMAL HIGH (ref 0.0–100.0)

## 2022-03-27 LAB — CBG MONITORING, ED
Glucose-Capillary: 133 mg/dL — ABNORMAL HIGH (ref 70–99)
Glucose-Capillary: 142 mg/dL — ABNORMAL HIGH (ref 70–99)
Glucose-Capillary: 188 mg/dL — ABNORMAL HIGH (ref 70–99)

## 2022-03-27 LAB — LACTIC ACID, PLASMA
Lactic Acid, Venous: 1.8 mmol/L (ref 0.5–1.9)
Lactic Acid, Venous: 2.9 mmol/L (ref 0.5–1.9)
Lactic Acid, Venous: 2.8 mmol/L (ref 0.5–1.9)
Lactic Acid, Venous: 3.5 mmol/L (ref 0.5–1.9)

## 2022-03-27 LAB — PROCALCITONIN: Procalcitonin: 30.75 ng/mL

## 2022-03-27 LAB — TROPONIN I (HIGH SENSITIVITY)
Troponin I (High Sensitivity): 16 ng/L (ref <18)
Troponin I (High Sensitivity): 13 ng/L (ref <18)

## 2022-03-27 LAB — APTT: aPTT: 46 seconds — ABNORMAL HIGH (ref 24–36)

## 2022-03-27 MED ORDER — DEXTROSE 50 % IV SOLN: 1.0000 | Freq: Once | INTRAVENOUS | Status: AC

## 2022-03-27 MED ORDER — INSULIN ASPART 100 UNIT/ML IJ SOLN
0.0000 [IU] | Freq: Three times a day (TID) | INTRAMUSCULAR | Status: DC
  Administered 2022-03-28: 4 [IU] via SUBCUTANEOUS
  Filled 2022-03-27: qty 1

## 2022-03-27 MED ORDER — CEFEPIME HCL 2 G IV SOLR: 2.0000 g | INTRAVENOUS | Status: DC

## 2022-03-27 MED ORDER — VANCOMYCIN HCL 750 MG/150ML IV SOLN: 750.0000 mg | INTRAVENOUS | Status: DC

## 2022-03-27 MED ORDER — DEXTROSE 50 % IV SOLN
INTRAVENOUS | Status: AC
  Administered 2022-03-27: 50 mL via INTRAVENOUS
  Filled 2022-03-27: qty 50

## 2022-03-27 MED ORDER — VANCOMYCIN HCL IN DEXTROSE 1-5 GM/200ML-% IV SOLN
1000.0000 mg | Freq: Once | INTRAVENOUS | Status: AC
  Administered 2022-03-27: 1000 mg via INTRAVENOUS
  Filled 2022-03-27: qty 200

## 2022-03-27 MED ORDER — SODIUM CHLORIDE 0.9 % IV SOLN
500.0000 mg | Freq: Every day | INTRAVENOUS | Status: DC
  Administered 2022-03-27: 500 mg via INTRAVENOUS
  Filled 2022-03-27 (×2): qty 5

## 2022-03-27 MED ORDER — SENNOSIDES-DOCUSATE SODIUM 8.6-50 MG PO TABS: 1.0000 | ORAL_TABLET | Freq: Every evening | ORAL | Status: DC | PRN

## 2022-03-27 MED ORDER — HEPARIN SODIUM (PORCINE) 5000 UNIT/ML IJ SOLN
5000.0000 [IU] | Freq: Three times a day (TID) | INTRAMUSCULAR | Status: DC
  Administered 2022-03-27 – 2022-03-28 (×2): 5000 [IU] via SUBCUTANEOUS
  Filled 2022-03-27 (×2): qty 1

## 2022-03-27 MED ORDER — GABAPENTIN 100 MG PO CAPS: 200.0000 mg | ORAL_CAPSULE | Freq: Three times a day (TID) | ORAL | Status: DC

## 2022-03-27 MED ORDER — SODIUM CHLORIDE 0.9 % IV SOLN
2.0000 g | Freq: Once | INTRAVENOUS | Status: AC
  Administered 2022-03-27: 2 g via INTRAVENOUS
  Filled 2022-03-27: qty 12.5

## 2022-03-27 MED ORDER — METRONIDAZOLE 500 MG/100ML IV SOLN
500.0000 mg | Freq: Two times a day (BID) | INTRAVENOUS | Status: DC
  Administered 2022-03-28: 500 mg via INTRAVENOUS
  Filled 2022-03-27: qty 100

## 2022-03-27 MED ORDER — DEXTROSE 10 % IV SOLN
INTRAVENOUS | Status: DC
Start: 2022-03-27 — End: 2022-03-28

## 2022-03-27 MED ORDER — SODIUM CHLORIDE 0.9 % IV BOLUS
1000.0000 mL | Freq: Once | INTRAVENOUS | Status: AC
  Administered 2022-03-27: 250 mL via INTRAVENOUS

## 2022-03-27 MED ORDER — ACETAMINOPHEN 325 MG PO TABS: 650.0000 mg | ORAL_TABLET | Freq: Four times a day (QID) | ORAL | Status: DC | PRN

## 2022-03-27 MED ORDER — HYDRALAZINE HCL 20 MG/ML IJ SOLN: 5.0000 mg | Freq: Four times a day (QID) | INTRAMUSCULAR | Status: DC | PRN

## 2022-03-27 MED ORDER — ONDANSETRON HCL 4 MG/2ML IJ SOLN: 4.0000 mg | Freq: Four times a day (QID) | INTRAMUSCULAR | Status: DC | PRN

## 2022-03-27 MED ORDER — LACTATED RINGERS IV SOLN: INTRAVENOUS | Status: AC

## 2022-03-27 MED ORDER — METRONIDAZOLE 500 MG/100ML IV SOLN
500.0000 mg | Freq: Once | INTRAVENOUS | Status: AC
  Administered 2022-03-27: 500 mg via INTRAVENOUS
  Filled 2022-03-27: qty 100

## 2022-03-27 MED ORDER — HALOPERIDOL 1 MG PO TABS
1.0000 mg | ORAL_TABLET | Freq: Four times a day (QID) | ORAL | Status: DC | PRN
Start: 2022-03-27 — End: 2022-03-28

## 2022-03-27 MED ORDER — VANCOMYCIN HCL 1500 MG/300ML IV SOLN
1500.0000 mg | Freq: Once | INTRAVENOUS | Status: AC
  Administered 2022-03-27: 1500 mg via INTRAVENOUS
  Filled 2022-03-27: qty 300

## 2022-03-27 MED ORDER — INSULIN ASPART 100 UNIT/ML IJ SOLN
0.0000 [IU] | Freq: Every day | INTRAMUSCULAR | Status: DC
  Filled 2022-03-27: qty 1

## 2022-03-27 MED ORDER — SODIUM CHLORIDE 0.9 % IV BOLUS: 250.0000 mL | Freq: Once | INTRAVENOUS | Status: DC

## 2022-03-27 MED ORDER — MORPHINE SULFATE (PF) 2 MG/ML IV SOLN
1.0000 mg | INTRAVENOUS | Status: DC | PRN
  Administered 2022-03-27: 1 mg via INTRAVENOUS
  Filled 2022-03-27: qty 1

## 2022-03-27 MED ORDER — ONDANSETRON HCL 4 MG PO TABS: 4.0000 mg | ORAL_TABLET | Freq: Four times a day (QID) | ORAL | Status: DC | PRN

## 2022-03-27 MED ORDER — ACETAMINOPHEN 325 MG RE SUPP: 650.0000 mg | Freq: Four times a day (QID) | RECTAL | Status: DC | PRN

## 2022-03-27 MED ORDER — METOPROLOL TARTRATE 5 MG/5ML IV SOLN: 5.0000 mg | INTRAVENOUS | Status: DC | PRN

## 2022-03-27 MED ORDER — SODIUM CHLORIDE 0.9 % IV BOLUS (SEPSIS): 1000.0000 mL | Freq: Once | INTRAVENOUS | Status: DC

## 2022-03-27 MED ORDER — RISPERIDONE 1 MG PO TABS: 0.5000 mg | ORAL_TABLET | Freq: Two times a day (BID) | ORAL | Status: DC

## 2022-03-27 MED ORDER — SODIUM CHLORIDE 0.9 % IV SOLN: 2.0000 g | Freq: Once | INTRAVENOUS | Status: DC

## 2022-03-27 NOTE — Assessment & Plan Note (Signed)
-   Status post D5 1 amp, D10 infusion - Repeat glucose was improved to 133 - CBG check 3 times daily and at bedtime

## 2022-03-27 NOTE — Assessment & Plan Note (Signed)
-   on HD TTS - Nephrology epic order placed and staff message to Dr. Theador Hawthorne for resumption of hemodialysis

## 2022-03-27 NOTE — Progress Notes (Signed)
Elink following sepsis bundle. °

## 2022-03-27 NOTE — ED Triage Notes (Signed)
Per EMS pt is coming from [facility] DNR/DNI (MOST form on hand) due to decrease response. Pt is currently on comfort care at facility. Family wanted pt to come to hospital. Pt is noted to have gurgling breaths and responses to pain.

## 2022-03-27 NOTE — Hospital Course (Signed)
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  Initial vitals in the emergency department showed temperature of 97.7, respiration rate of 24, heart rate of 126, blood pressure 84/45, improved to 96/53, SPO2 of 96% on nonrebreather.  Serum sodium is 144, potassium 3.2, chloride 106, bicarb 26, BUN of 19, serum creatinine of 2.95, GFR of 17, nonfasting blood glucose 37, WBC 17.7, hemoglobin 9.9, platelets of 329.  Lactic acid was 1.8 and increased to 2.9.  COVID/influenza A/influenza B PCR were negative.  ED treatment: Dextrose 50%, 1 amp, cefepime 2 g IV, dextrose 10% infusion at 75 mL/h, metronidazole 500 mg IV, vancomycin 1 g IV per pharmacy.

## 2022-03-27 NOTE — Consult Note (Signed)
Pharmacy Antibiotic Note  Theresa Warren is a 66 y.o. female admitted on 03/08/2022 with sepsis.  Pharmacy has been consulted for vancomycin and cefepime dosing.  7/25 Received Vancomycin 1000mg  IV x1 in Ed  Plan: Give Cefepime 2gm IV every 24 hours Give Vancomycin 1500mg  IV x1 followed by 750mg  IV every 48 hours Goal AUC 400-550  Est AUC: 472.1 Est Cmax: 28.8 Est Cmin: 12.9 Calculated with SCr 2.95  Vd 0.5 given calculated BMI 37.5      Temp (24hrs), Avg:97.8 F (36.6 C), Min:97.7 F (36.5 C), Max:97.8 F (36.6 C)  Recent Labs  Lab 03/21/22 0447 03/07/2022 1355 03/15/2022 1600  WBC 15.3* 17.7*  --   CREATININE 3.42* 2.95*  --   LATICACIDVEN  --  1.8 2.9*    Estimated Creatinine Clearance: 20.5 mL/min (A) (by C-G formula based on SCr of 2.95 mg/dL (H)).    Allergies  Allergen Reactions   Codeine Nausea And Vomiting    Other reaction(s): nausea and vomiting, vomiting   Penicillins Rash and Hives    Other reaction(s): systemic rash   Carvedilol     Other reaction(s): high sugar, headache, cough   Fluoxetine     Other reaction(s): took in 1990 . made her worse.   Liraglutide     Other reaction(s): too nauseated.   Pioglitazone     Other reaction(s): edema and osteoporosis   Sulfa Antibiotics Swelling   Clindamycin Hcl Rash   Clindamycin/Lincomycin Rash   Dilaudid [Hydromorphone Hcl] Rash    Antimicrobials this admission: 8/25 Cefepime >>  8/25 Metronidazole >>  8/25 Vancomycin  Microbiology results: 8/25 BCx: sent 8/25 UCx: sent    Thank you for allowing pharmacy to be a part of this patient's care.  Darrick Penna 04/01/2022 5:49 PM

## 2022-03-27 NOTE — Assessment & Plan Note (Signed)
-   P.o. medication has been held on admission due to BiPAP requirement at this time - Judicious/conservative pain management is indicated at this time due to acute respiratory distress - Morphine 1 mg IV every 4 hours as needed for severe pain, 4 doses ordered

## 2022-03-27 NOTE — Assessment & Plan Note (Signed)
-   P.o. blood pressure medication held on admission due to BiPAP requirement - Home metoprolol 25 mg p.o. twice daily held on admission - Hydralazine 5 mg IV every 6 hours as needed for SBP greater than 180, 4 days ordered

## 2022-03-27 NOTE — Assessment & Plan Note (Signed)
-   Presumed secondary to infectious etiology - Blood cultures x2 are in process, procalcitonin in process, urine analysis and urine culture have been ordered and pending collection - CBC in the a.m.

## 2022-03-27 NOTE — Assessment & Plan Note (Signed)
-   Hypoglycemia on admission - Holding home long-acting insulin and 3 times daily AC dosing - Insulin SSI with at bedtime coverage ordered - Goal inpatient blood glucose levels 140-180

## 2022-03-27 NOTE — Telephone Encounter (Signed)
Received call from Sharlot Gowda., NP at [facility] [phone]) on behalf of patient. States that patient was discharged from the hospital approximately 1 week ago, and white count collected yesterday was 17.7, trending upward since discharge. States LFTs are also trending up.  Requests advice from provider. Routed to Dr. Delaine Lame.  Binnie Kand, RN

## 2022-03-27 NOTE — Consult Note (Signed)
PHARMACY -  BRIEF ANTIBIOTIC NOTE   Pharmacy has received consult(s) for sepsis from an ED provider.  The patient's profile has been reviewed for ht/wt/allergies/indication/available labs.    One time order(s) placed for aztreonam and vancomycin. Pt tolerated cefepime and ceftriaxone in the past. Last administration in 01/2022. Aztreonam switched to cefepime.   Further antibiotics/pharmacy consults should be ordered by admitting physician if indicated.                       Thank you, Oswald Hillock, PharmD, BCPS 03/30/2022  12:59 PM

## 2022-03-27 NOTE — Progress Notes (Signed)
PIV consult: BUE assessed with ultrasound. One suitable vein found for cannulation. Sheran Lawless, RN aware.

## 2022-03-27 NOTE — Assessment & Plan Note (Addendum)
Left lower lobe pneumonia versus bacteremia - Patient meets criteria for severe sepsis with increased respiration rate, heart rate, hypotension, leukocytosis, elevated lactic acid, possible source of bacteremia versus pneumonia, left lower lobe - Respiratory organ involvement - Continue broad-spectrum antibiotic with vancomycin, cefepime, metronidazole - Blood cultures x2 have been ordered, procalcitonin urgent - UA and urine culture are ordered and needs to be collected - Admit to stepdown, inpatient

## 2022-03-27 NOTE — Code Documentation (Signed)
CODE SEPSIS - PHARMACY COMMUNICATION  **Broad Spectrum Antibiotics should be administered within 1 hour of Sepsis diagnosis**  Time Code Sepsis Called/Page Received: 1250  Antibiotics Ordered: Vancomycin + Cefepime + Metronidazole  Time of 1st antibiotic administration: [place] ,PharmD Clinical Pharmacist  03/12/2022  1:54 PM

## 2022-03-27 NOTE — ED Provider Notes (Signed)
[facility] Provider Note    Event Date/Time   First MD Initiated Contact with Patient 03/05/2022 1316     (approximate)  History   Chief Complaint: Altered Mental Status  HPI  Theresa Warren is a 66 y.o. female with a past medical history of ESRD on HD, diabetes, hypertension, hyperlipidemia, obesity, currently living out of facility on comfort care measures who presents to the emergency department for unresponsiveness/decreased responsiveness.  I reviewed the patient's recent discharge summary from 03/21/2022.  Patient was admitted at that time for paranoia and hallucinations, had positive blood cultures, started on vancomycin with dialysis, also with significant wounds over the sacrum and buttocks consistent with calciphylaxis.  Patient was reportedly discharged to her nursing facility per power of attorney patient was on comfort care type measures however up until yesterday was acting at her baseline which includes conversing with others although some mild confusion at baseline.  I spoke to the power of attorney [name]), she did confirm that the patient has a DO NOT RESUSCITATE order which came with the patient here.  She states the patient would not want to be resuscitated would not want to be intubated but short of that would like all medical treatments including BiPAP and IV antibiotics.  Here the patient is found to be hypotensive 83/45, reassuringly she is afebrile but tachycardic at 126.  Patient is largely unresponsive not reacting to painful stimuli.  Patient appears somewhat fluid overloaded with edematous extremities currently satting 85% on a nonrebreather mask at 15 L of oxygen.  Physical Exam   Triage Vital Signs: ED Triage Vitals  Enc Vitals Group     BP 03/24/2022 1242 (!) 83/45     Pulse Rate 03/13/2022 1242 (!) 126     Resp 03/21/2022 1242 20     Temp 03/18/2022 1242 97.7 F (36.5 C)     Temp Source 04/02/2022 1242 Axillary     SpO2  04/01/2022 1242 (!) 85 %     Weight --      Height --      Head Circumference --      Peak Flow --      Pain Score 03/10/2022 1241 0     Pain Loc --      Pain Edu? --      Excl. in [place]? --     Most recent vital signs: Vitals:   03/23/2022 1242 03/11/2022 1255  BP: (!) 83/45   Pulse: (!) 126 (!) 126  Resp: 20 (!) 24  Temp: 97.7 F (36.5 C)   SpO2: (!) 85% 96%    General: Unresponsive CV:  Regular rhythm rate around 120 bpm. Resp:  Mild tachypnea with mild rhonchi auscultated bilaterally. Abd:  No significant distention, soft.  Not reacting to palpation. Other:  Peripheral edema in bilateral legs and arms consistent with fluid overload   ED Results / Procedures / Treatments   EKG  EKG viewed and interpreted by myself shows sinus tachycardia 120 bpm with a narrow QRS, normal axis, normal intervals, nonspecific ST changes.  RADIOLOGY  I have reviewed and interpreted the chest x-ray images.  Patient appears to have hazy opacities bilaterally most consistent with pulmonary edema although this could indicate atypical infection as well. Radiology has read the x-ray as bibasilar pulmonary infiltrates left greater than right.  MEDICATIONS ORDERED IN ED: Medications  lactated ringers infusion (has no administration in time range)  sodium chloride 0.9 % bolus 1,000 mL (has no administration  in time range)  metroNIDAZOLE (FLAGYL) IVPB 500 mg (has no administration in time range)  vancomycin (VANCOCIN) IVPB 1000 mg/200 mL premix (has no administration in time range)  ceFEPIme (MAXIPIME) 2 g in sodium chloride 0.9 % 100 mL IVPB (has no administration in time range)     IMPRESSION / MDM / ASSESSMENT AND PLAN / ED COURSE  I reviewed the triage vital signs and the nursing notes.  Patient's presentation is most consistent with acute presentation with potential threat to life or bodily function.  Patient presents emergency department for unresponsiveness found to be hypotensive tachycardic  with rhonchi bilaterally.  I spoke to the patient's power of attorney she would like all medical care performed besides intubation and resuscitation.  She states up until yesterday the patient was conversing rather normally so this is a quick decline.  Patient's chest x-ray shows bibasilar infiltrates possibly indicating pneumonia versus edema.  Patient will be covered with broad-spectrum antibiotics we have sent blood cultures.  We will IV hydrate although I am concerned giving too much fluid too quickly given the patient's chest x-ray findings and clinical peripheral edema in all extremities.  Lab work is pending.  We will place the patient on BiPAP and currently satting 96% on BiPAP.  Although the patient is hypotensive we will hold off on significant fluid administration given the patient's fluid overload status.  Patient's labs have resulted showing elevated lactic acid greater than 3.0.  Chemistry shows renal insufficiency but not significantly changed from prior labs..  CBC shows moderate leukocytosis otherwise no concerning findings.  Troponin negative.  Patient admitted to the hospital service for ongoing presumed sepsis work-up and management.  I confirmed with the power of attorney patient is a DNR DNI however otherwise would like fairly full scope of treatment.  CRITICAL CARE Performed by: Harvest Dark   Total critical care time: 30 minutes  Critical care time was exclusive of separately billable procedures and treating other patients.  Critical care was necessary to treat or prevent imminent or life-threatening deterioration.  Critical care was time spent personally by me on the following activities: development of treatment plan with patient and/or surrogate as well as nursing, discussions with consultants, evaluation of patient's response to treatment, examination of patient, obtaining history from patient or surrogate, ordering and performing treatments and interventions,  ordering and review of laboratory studies, ordering and review of radiographic studies, pulse oximetry and re-evaluation of patient's condition.   FINAL CLINICAL IMPRESSION(S) / ED DIAGNOSES   Unresponsiveness Hypotension Pneumonia Sepsis   Note:  This document was prepared using Dragon voice recognition software and may include unintentional dictation errors.   Harvest Dark, MD 03/30/22 1038

## 2022-03-27 NOTE — Assessment & Plan Note (Signed)
-   Complicated wound, possible calciphylaxis - Patient would benefit from outpatient multidisciplinary surgical team available at tertiary center with plastic surgery, reconstructive surgery, dermatology

## 2022-03-27 NOTE — H&P (Addendum)
History and Physical   Madelin Weseman WJX:[id number] DOB: [birth year] DOA: 03/03/2022  PCP: Crist Infante, MD  Patient coming from: [facility]  I have personally briefly reviewed patient's old medical records in [place].  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  Initial vitals in the emergency department showed temperature of 97.7, respiration rate of 24, heart rate of 126, blood pressure 84/45, improved to 96/53, SPO2 of 96% on nonrebreather.  Serum sodium is 144, potassium 3.2, chloride 106, bicarb 26, BUN of 19, serum creatinine of 2.95, GFR of 17, nonfasting blood glucose 37, WBC 17.7, hemoglobin 9.9, platelets of 329.  Lactic acid was 1.8 and increased to 2.9.  COVID/influenza A/influenza B PCR were negative.  ED treatment: Dextrose 50%, 1 amp, cefepime 2 g IV, dextrose 10% infusion at 75 mL/h, metronidazole 500 mg IV, vancomycin 1 g IV per pharmacy.  At baseline patient does open her eyes with loud verbal stimuli.  She mumbles.  BiPAP in place.  She does not appear to be in acute distress.  She appears to be chronically ill with guarded prognosis.  Her friends are at bedside.  Ms. [name] is a work friend and power of attorney.  Harlon Ditty, another friend is also at bedside.  Ms. Cline Cools states that patient is DNR/DNI.  She would like the patient to be comfortable however continue treatment.  Ms. Cline Cools states that they are not ready for comfort measures at this time.  Social history: Patient is from nursing facility.  ROS: Unable to  complete as patient is minimally verbal, currently on BiPAP  ED Course: Discussed with emergency medicine provider, patient requiring hospitalization for chief concerns of respiratory failure.  Assessment/Plan  Principal Problem:   Severe sepsis (HCC) Active Problems:   Bacteremia due to methicillin resistant Staphylococcus epidermidis   Chronic pain syndrome   Swelling of left lower extremity   Leukocytosis   Essential hypertension   Type 2 diabetes mellitus with chronic kidney disease, with long-term current use of insulin (HCC)   Symptomatic anemia   Morbid (severe) obesity due to excess calories ([place])   Uncontrolled type 2 diabetes mellitus with hyperglycemia, with long-term current use of insulin (HCC)   Calciphylaxis   ESRD (end stage renal disease) (HCC)   Shortness of breath   Hypoglycemia   Acute respiratory failure with hypoxia (HCC)   Failure to thrive in adult   Assessment and Plan:  * Severe sepsis (HCC) Left lower lobe pneumonia versus bacteremia - Patient meets criteria for severe sepsis with increased respiration rate, heart rate, hypotension, leukocytosis, elevated lactic acid, possible source of bacteremia versus pneumonia, left lower lobe - Respiratory organ involvement - Continue broad-spectrum antibiotic with vancomycin, cefepime, metronidazole - Blood cultures x2 have been ordered, procalcitonin urgent - UA and urine culture are ordered and needs to be collected - Admit to stepdown, inpatient  Bacteremia due to methicillin resistant Staphylococcus epidermidis - Patient was getting vancomycin on dialysis days - Vancomycin per pharmacy for sepsis per above  Chronic pain syndrome - P.o. medication has been held on admission due to BiPAP requirement at this time - Judicious/conservative pain management is indicated at this time due to acute respiratory  distress - Morphine 1 mg IV every 4 hours as needed for severe pain, 4 doses ordered  Leukocytosis -  Presumed secondary to infectious etiology - Blood cultures x2 are in process, procalcitonin in process, urine analysis and urine culture have been ordered and pending collection - CBC in the a.m.  Essential hypertension - P.o. blood pressure medication held on admission due to BiPAP requirement - Home metoprolol 25 mg p.o. twice daily held on admission - Hydralazine 5 mg IV every 6 hours as needed for SBP greater than 180, 4 days ordered  Failure to thrive in adult - This is patient's fifth hospitalization this year - If patient does not respond with aggressive antibiotic and BiPAP therapy, I would recommend a.m. team to consult palliative for consideration of comfort care on hospice - I discussed with POA at bedside, she states that they are not ready for this - Patient does not have family or first relatives.  Patient only has second degree cousins  Acute respiratory failure with hypoxia ([place]) - Patient was satting at 85% on nonrebreather mask at 15 L of O2 - Presumed secondary to left lower lobe pneumonia, severe sepsis - Continue BiPAP - Check high sensitive troponin, BNP given bilateral lower extremity pitting edema, 3+ - Admit to stepdown, inpatient  Hypoglycemia - Status post D5 1 amp, D10 infusion - Repeat glucose was improved to 133 - CBG check 3 times daily and at bedtime  Shortness of breath - Possible source of left lower lobe pneumonia - COVID/influenza A/influenza B PCR were negative - Stat/urgent procalcitonin ordered - Continue with cefepime and vancomycin per pharmacy, metronidazole 500 mg IV twice daily - Continue BiPAP - Admit to stepdown, inpatient  ESRD (end stage renal disease) ([place]) - on HD TTS - Nephrology epic order placed and staff message to Dr. Theador Hawthorne for resumption of hemodialysis  Calciphylaxis - Complicated wound, possible calciphylaxis - Patient would benefit from outpatient multidisciplinary surgical team available at tertiary center with  plastic surgery, reconstructive surgery, dermatology  Uncontrolled type 2 diabetes mellitus with hyperglycemia, with long-term current use of insulin ([place]) - Hypoglycemia on admission - Holding home long-acting insulin and 3 times daily AC dosing - Insulin SSI with at bedtime coverage ordered - Goal inpatient blood glucose levels 140-180  Chart reviewed.   Complete echo on 01/03/2022: Left ventricular ejection fraction estimated at 60 to 65%.  DVT prophylaxis: Heparin 5000 units subcutaneous every 8 hours Code Status: DNR/DNI Diet: N.p.o. Family Communication: Updated POA at bedside Disposition Plan: Guarded prognosis Consults called: None at this time Admission status: Stepdown, inpatient  Past Medical History:  Diagnosis Date   CKD (chronic kidney disease), stage III ([place])    Diabetes ([place])    Hyperlipidemia    Hypertension    Obesity    Past Surgical History:  Procedure Laterality Date   ACHILLES TENDON REPAIR     ANKLE SURGERY     DIALYSIS/PERMA CATHETER INSERTION N/A 01/09/2022   Procedure: DIALYSIS/PERMA CATHETER INSERTION;  Surgeon: Katha Cabal, MD;  Location: [facility];  Service: Cardiovascular;  Laterality: N/A;   INCISION AND DRAINAGE ABSCESS Bilateral 01/01/2022   Procedure: INCISION AND DRAINAGE ABSCESS-Sacral Decubitus;  Surgeon: Olean Ree, MD;  Location: [facility];  Service: General;  Laterality: Bilateral;   URETHRAL STRICTURE DILATATION     Social History:  reports that she has never smoked. She does not have any smokeless tobacco history on file. She reports that she does not currently use alcohol. She reports  that she does not currently use drugs.  Allergies  Allergen Reactions   Codeine Nausea And Vomiting    Other reaction(s): nausea and vomiting, vomiting   Penicillins Rash and Hives    Other reaction(s): systemic rash   Carvedilol     Other reaction(s): high sugar, headache, cough   Fluoxetine     Other reaction(s): took in 1990 .  made her worse.   Liraglutide     Other reaction(s): too nauseated.   Pioglitazone     Other reaction(s): edema and osteoporosis   Sulfa Antibiotics Swelling   Clindamycin Hcl Rash   Clindamycin/Lincomycin Rash   Dilaudid [Hydromorphone Hcl] Rash   Family History  Problem Relation Age of Onset   Alzheimer's disease Father    Diabetes Father    CAD Father 26   CAD Other        Multliple maternal aunts and uncles with early onset heart disease   Lung cancer Sister    ALS Mother    Family history: Family history reviewed and not pertinent.  Prior to Admission medications   Medication Sig Start Date End Date Taking? Authorizing Provider  acetaminophen (TYLENOL) 325 MG tablet Take 975 mg by mouth every 6 (six) hours as needed for mild pain.    [provider]  allopurinol (ZYLOPRIM) 100 MG tablet Take 100 mg by mouth daily.    [provider]  ascorbic acid (VITAMIN C) 500 MG tablet Take 1 tablet (500 mg total) by mouth 2 (two) times daily. 02/10/22   Enzo Bi, MD  atorvastatin (LIPITOR) 40 MG tablet Take 40 mg by mouth daily.    [provider]  cetirizine (ZYRTEC) 10 MG tablet Take 10 mg by mouth daily.    [provider]  Ensure Max Protein (ENSURE MAX PROTEIN) LIQD Take 330 mLs (11 oz total) by mouth 2 (two) times daily. 02/10/22   Enzo Bi, MD  epoetin alfa (EPOGEN) 10000 UNIT/ML injection Inject 1 mL (10,000 Units total) into the vein every Monday, Wednesday, and Friday with hemodialysis. 02/11/22   Enzo Bi, MD  fluticasone (FLONASE) 50 MCG/ACT nasal spray Place 2 sprays into both nostrils daily. 11/27/14 02/22/22  Vilinda Boehringer, MD  gabapentin (NEURONTIN) 100 MG capsule Take 2 capsules (200 mg total) by mouth 3 (three) times daily. 02/10/22   Enzo Bi, MD  haloperidol (HALDOL) 1 MG tablet Take 1 tablet (1 mg total) by mouth every 6 (six) hours as needed for agitation. 03/21/22 03/21/23  Allie Bossier, MD  insulin glargine-yfgn (SEMGLEE) 100  UNIT/ML injection Inject 0.15 mLs (15 Units total) into the skin at bedtime. 02/10/22   Enzo Bi, MD  insulin lispro (HUMALOG) 100 UNIT/ML injection Inject 3 Units into the skin 4 (four) times daily -  before meals and at bedtime.    [provider]  metoprolol tartrate (LOPRESSOR) 25 MG tablet Take 1 tablet (25 mg total) by mouth 2 (two) times daily. 12/23/21   Sharen Hones, MD  multivitamin (RENA-VIT) TABS tablet Take 1 tablet by mouth at bedtime. 02/10/22   Enzo Bi, MD  nutrition supplement, JUVEN, (JUVEN) PACK Take 1 packet by mouth 2 (two) times daily between meals. 02/10/22   Enzo Bi, MD  nystatin (MYCOSTATIN/NYSTOP) powder Apply topically. 10/28/21   [provider]  Omega-3 Fatty Acids (FISH OIL) 500 MG CAPS Take by mouth.    [provider]  oxyCODONE (OXY IR/ROXICODONE) 5 MG immediate release tablet Take 1 tablet (5 mg total) by mouth every  6 (six) hours as needed for moderate pain or severe pain (can give 10 mg 30 minutes before dressing change). SNF use only.  Refills to be considered by SNF staff 03/02/22   Sidney Ace, MD  polyethylene glycol (MIRALAX / GLYCOLAX) 17 g packet Take 17 g by mouth daily. 02/11/22   Enzo Bi, MD  psyllium (HYDROCIL/METAMUCIL) 95 % PACK Take 1 packet by mouth daily. 03/02/22   Sidney Ace, MD  risperiDONE (RISPERDAL) 0.5 MG tablet Take 1 tablet (0.5 mg total) by mouth 2 (two) times daily. 03/21/22   Allie Bossier, MD  senna-docusate (SENOKOT-S) 8.6-50 MG tablet Take 1 tablet by mouth 2 (two) times daily. 02/10/22   Enzo Bi, MD  simethicone (MYLICON) 80 MG chewable tablet Chew 1 tablet (80 mg total) by mouth 4 (four) times daily. 03/02/22   Ralene Muskrat B, MD  sodium chloride 0.9 % SOLN 100 mL with sodium thiosulfate 250 MG/ML SOLN 25 g Inject 25 g into the vein Every Tuesday,Thursday,and Saturday with dialysis. 03/21/22   Allie Bossier, MD  vancomycin (VANCOCIN) 1-5 GM/200ML-% SOLN Inject 200 mLs (1,000 mg total)  into the vein Every Tuesday,Thursday,and Saturday with dialysis. 03/21/22   Allie Bossier, MD   Physical Exam: Vitals:   03/26/2022 1530 03/25/2022 1600 03/12/2022 1630 03/04/2022 1700  BP: (!) 88/68 (!) 87/59 101/66 108/64  Pulse:  (!) 130  (!) 123  Resp: 18 (!) 31 (!) 33 (!) 27  Temp:    97.8 F (36.6 C)  TempSrc:    Axillary  SpO2:  100%  100%   Constitutional: appears frail, chronically ill, NAD, calm, comfortable Eyes: PERRL, lids and conjunctivae normal ENMT: Mucous membranes are moist. Posterior pharynx clear of any exudate or lesions. Age-appropriate dentition.  Unable to assess hearing Neck: normal, supple, no masses, no thyromegaly Respiratory: Generalized decreased lung sounds bilaterally, motor sounds consistent with BiPAP Cardiovascular: Tachycardia, regular rhythm, no murmurs / rubs / gallops.  Bilateral lower extremity 3+ pitting edema. 2+ pedal pulses. No carotid bruits.  Abdomen: Obese abdomen, no tenderness, no masses palpated, no hepatosplenomegaly.  decreased bowel sounds on auscultation Musculoskeletal: no clubbing / cyanosis. No joint deformity upper and lower extremities. Good ROM, no contractures, no atrophy. Normal muscle tone.  Skin: no rashes, lesions, ulcers. No induration Neurologic: Sensation intact. Strength 5/5 in all 4.  Psychiatric: Unable to assessed judgment, insight, alertness, awareness, mood  EKG: independently reviewed, showing sinus tachycardia with rate of 120, QTc 444  Chest x-ray on Admission: I personally reviewed and I agree with radiologist reading as below.  DG Chest Port 1 View  Result Date: 03/13/2022 CLINICAL DATA:  Decreased responsiveness, gurgling breaths, diabetes mellitus, hypertension, chronic kidney disease EXAM: PORTABLE CHEST 1 VIEW COMPARISON:  Portable exam 1304 hours compared to 11/23 FINDINGS: Severely rotated to the LEFT. Enlargement of cardiac silhouette. Stable RIGHT jugular catheter, tip projecting over RIGHT atrium.  Bibasilar infiltrates, greater on LEFT. Upper lungs clear. No pleural effusion or pneumothorax. IMPRESSION: Bibasilar pulmonary infiltrates greater on LEFT. Electronically Signed   By: Lavonia Dana M.D.   On: 03/12/2022 13:15    Labs on Admission: I have personally reviewed following labs  CBC: Recent Labs  Lab 03/21/22 0447 03/15/2022 1355  WBC 15.3* 17.7*  NEUTROABS 13.0* 16.2*  HGB 9.7* 9.9*  HCT 30.3* 32.6*  MCV 91.3 95.9  PLT 282 431   Basic Metabolic Panel: Recent Labs  Lab 03/21/22 0447 03/10/2022 1355  NA 134* 144  K 3.3*  3.2*  CL 100 106  CO2 25 26  GLUCOSE 148* 37*  BUN 24* 19  CREATININE 3.42* 2.95*  CALCIUM 8.3* 9.2  MG 1.8  --   PHOS 3.0  --    GFR: Estimated Creatinine Clearance: 20.5 mL/min (A) (by C-G formula based on SCr of 2.95 mg/dL (H)).  Liver Function Tests: Recent Labs  Lab 03/21/22 0447 03/16/2022 1355  AST 63* 53*  ALT 32 49*  ALKPHOS 357* 295*  BILITOT 1.0 0.9  PROT 5.7* 6.4*  ALBUMIN 1.6* 1.9*   Coagulation Profile: Recent Labs  Lab 03/24/2022 1355  INR 1.3*   CBG: Recent Labs  Lab 03/20/22 2050 03/21/22 1220 03/21/22 1602 03/24/2022 1553 03/17/2022 1752  GLUCAP 165* 139* 144* 133* 142*   Urine analysis:    Component Value Date/Time   COLORURINE YELLOW (A) 01/02/2022 0038   APPEARANCEUR CLOUDY (A) 01/02/2022 0038   LABSPEC 1.013 01/02/2022 0038   PHURINE 5.0 01/02/2022 0038   GLUCOSEU NEGATIVE 01/02/2022 0038   HGBUR SMALL (A) 01/02/2022 0038   BILIRUBINUR NEGATIVE 01/02/2022 0038   KETONESUR NEGATIVE 01/02/2022 0038   PROTEINUR 100 (A) 01/02/2022 0038   NITRITE NEGATIVE 01/02/2022 0038   LEUKOCYTESUR NEGATIVE 01/02/2022 0038   CRITICAL CARE Performed by: Briant Cedar Leanda Padmore  Total critical care time: 35 minutes  Critical care time was exclusive of separately billable procedures and treating other patients.  Critical care was necessary to treat or prevent imminent or life-threatening deterioration.  Critical care was time  spent personally by me on the following activities: development of treatment plan with patient and/or surrogate as well as nursing, discussions with consultants, evaluation of patient's response to treatment, examination of patient, obtaining history from patient or surrogate, ordering and performing treatments and interventions, ordering and review of laboratory studies, ordering and review of radiographic studies, pulse oximetry and re-evaluation of patient's condition.  Dr. Tobie Poet [facility]  If 7PM-7AM, please contact overnight-coverage provider If 7AM-7PM, please contact day coverage provider [email or web address]  03/18/2022, 7:30 PM

## 2022-03-27 NOTE — Telephone Encounter (Signed)
Returned call to [employer]. Relayed per Dr. Delaine Lame, that she is not following this patient as an outpatient. Per provider, recommended that Faith reach out to either nephrology or wound care providers. Faith stated understanding and had no additional questions.  Binnie Kand, RN

## 2022-03-27 NOTE — Code Documentation (Signed)
CODE SEPSIS - PHARMACY COMMUNICATION  **Broad Spectrum Antibiotics should be administered within 1 hour of Sepsis diagnosis**  Time Code Sepsis Called/Page Received: 3557 (already called previously)  Antibiotics Ordered: Vancomycin + Cefepime + Metronidazole  Time of 1st antibiotic administration: [place] ,PharmD Clinical Pharmacist  04/01/2022  5:47 PM

## 2022-03-27 NOTE — Assessment & Plan Note (Signed)
-   Patient was getting vancomycin on dialysis days - Vancomycin per pharmacy for sepsis per above

## 2022-03-27 NOTE — Assessment & Plan Note (Signed)
-   Possible source of left lower lobe pneumonia - COVID/influenza A/influenza B PCR were negative - Stat/urgent procalcitonin ordered - Continue with cefepime and vancomycin per pharmacy, metronidazole 500 mg IV twice daily - Continue BiPAP - Admit to stepdown, inpatient

## 2022-03-27 NOTE — Assessment & Plan Note (Signed)
-   This is patient's fifth hospitalization this year - If patient does not respond with aggressive antibiotic and BiPAP therapy, I would recommend a.m. team to consult palliative for consideration of comfort care on hospice - I discussed with POA at bedside, she states that they are not ready for this - Patient does not have family or first relatives.  Patient only has second degree cousins

## 2022-03-27 NOTE — Assessment & Plan Note (Addendum)
-   Patient was satting at 85% on nonrebreather mask at 15 L of O2 - Presumed secondary to left lower lobe pneumonia, severe sepsis - Continue BiPAP - Check high sensitive troponin, BNP given bilateral lower extremity pitting edema, 3+ - Admit to stepdown, inpatient

## 2022-03-28 DIAGNOSIS — Z1629 Resistance to other single specified antibiotic: Secondary | ICD-10-CM

## 2022-03-28 DIAGNOSIS — R6520 Severe sepsis without septic shock: Secondary | ICD-10-CM | POA: Diagnosis not present

## 2022-03-28 DIAGNOSIS — R7881 Bacteremia: Secondary | ICD-10-CM | POA: Diagnosis not present

## 2022-03-28 DIAGNOSIS — R0602 Shortness of breath: Secondary | ICD-10-CM

## 2022-03-28 DIAGNOSIS — D72823 Leukemoid reaction: Secondary | ICD-10-CM

## 2022-03-28 DIAGNOSIS — J9601 Acute respiratory failure with hypoxia: Secondary | ICD-10-CM | POA: Diagnosis not present

## 2022-03-28 DIAGNOSIS — B957 Other staphylococcus as the cause of diseases classified elsewhere: Secondary | ICD-10-CM

## 2022-03-28 DIAGNOSIS — N186 End stage renal disease: Secondary | ICD-10-CM

## 2022-03-28 DIAGNOSIS — A419 Sepsis, unspecified organism: Secondary | ICD-10-CM | POA: Diagnosis not present

## 2022-03-28 LAB — PROCALCITONIN: Procalcitonin: 41.96 ng/mL

## 2022-03-28 LAB — CBC
HCT: 27.2 % — ABNORMAL LOW (ref 36.0–46.0)
Hemoglobin: 8.5 g/dL — ABNORMAL LOW (ref 12.0–15.0)
MCH: 29.8 pg (ref 26.0–34.0)
MCHC: 31.3 g/dL (ref 30.0–36.0)
MCV: 95.4 fL (ref 80.0–100.0)
Platelets: 180 K/uL (ref 150–400)
RBC: 2.85 MIL/uL — ABNORMAL LOW (ref 3.87–5.11)
RDW: 17 % — ABNORMAL HIGH (ref 11.5–15.5)
WBC: 33.5 K/uL — ABNORMAL HIGH (ref 4.0–10.5)
nRBC: 0 % (ref 0.0–0.2)

## 2022-03-28 LAB — BASIC METABOLIC PANEL WITH GFR
Anion gap: 10 (ref 5–15)
BUN: 24 mg/dL — ABNORMAL HIGH (ref 8–23)
CO2: 23 mmol/L (ref 22–32)
Calcium: 8.3 mg/dL — ABNORMAL LOW (ref 8.9–10.3)
Chloride: 105 mmol/L (ref 98–111)
Creatinine, Ser: 3.19 mg/dL — ABNORMAL HIGH (ref 0.44–1.00)
GFR, Estimated: 16 mL/min — ABNORMAL LOW
Glucose, Bld: 298 mg/dL — ABNORMAL HIGH (ref 70–99)
Potassium: 3.7 mmol/L (ref 3.5–5.1)
Sodium: 138 mmol/L (ref 135–145)

## 2022-03-28 LAB — CBG MONITORING, ED
Glucose-Capillary: 203 mg/dL — ABNORMAL HIGH (ref 70–99)
Glucose-Capillary: 329 mg/dL — ABNORMAL HIGH (ref 70–99)

## 2022-03-28 LAB — CORTISOL: Cortisol, Plasma: 39.1 ug/dL

## 2022-03-28 LAB — LACTIC ACID, PLASMA: Lactic Acid, Venous: 3 mmol/L (ref 0.5–1.9)

## 2022-03-28 MED ORDER — MORPHINE BOLUS VIA INFUSION
5.0000 mg | INTRAVENOUS | Status: DC | PRN
  Administered 2022-03-28 (×3): 5 mg via INTRAVENOUS

## 2022-03-28 MED ORDER — GLYCOPYRROLATE 0.2 MG/ML IJ SOLN: 0.2000 mg | INTRAMUSCULAR | Status: DC | PRN

## 2022-03-28 MED ORDER — MIDODRINE HCL 5 MG PO TABS
10.0000 mg | ORAL_TABLET | Freq: Three times a day (TID) | ORAL | Status: DC
Start: 2022-03-28 — End: 2022-03-28
  Filled 2022-03-28: qty 2

## 2022-03-28 MED ORDER — MORPHINE 100MG IN NS 100ML (1MG/ML) PREMIX INFUSION
0.0000 mg/h | INTRAVENOUS | Status: DC
  Administered 2022-03-28: 5 mg/h via INTRAVENOUS
  Administered 2022-03-28: 15 mg/h via INTRAVENOUS
  Administered 2022-03-28: 5 mg/h via INTRAVENOUS
  Filled 2022-03-28 (×3): qty 100

## 2022-03-28 MED ORDER — SODIUM CHLORIDE 0.9 % IV BOLUS
1000.0000 mL | Freq: Once | INTRAVENOUS | Status: AC
  Administered 2022-03-28: 1000 mL via INTRAVENOUS

## 2022-03-28 MED ORDER — GLYCOPYRROLATE 1 MG PO TABS: 1.0000 mg | ORAL_TABLET | ORAL | Status: DC | PRN

## 2022-03-28 MED ORDER — POLYVINYL ALCOHOL 1.4 % OP SOLN: 1.0000 [drp] | Freq: Four times a day (QID) | OPHTHALMIC | Status: DC | PRN

## 2022-03-28 MED ORDER — ACETAMINOPHEN 650 MG RE SUPP: 650.0000 mg | Freq: Four times a day (QID) | RECTAL | Status: DC | PRN

## 2022-03-28 MED ORDER — HALOPERIDOL LACTATE 5 MG/ML IJ SOLN: 2.5000 mg | INTRAMUSCULAR | Status: DC | PRN

## 2022-03-28 MED ORDER — SODIUM CHLORIDE 0.9 % IV SOLN: INTRAVENOUS | Status: DC

## 2022-03-28 MED ORDER — ACETAMINOPHEN 325 MG PO TABS: 650.0000 mg | ORAL_TABLET | Freq: Four times a day (QID) | ORAL | Status: DC | PRN

## 2022-03-28 MED ORDER — PROCHLORPERAZINE EDISYLATE 10 MG/2ML IJ SOLN
10.0000 mg | Freq: Four times a day (QID) | INTRAMUSCULAR | Status: DC | PRN
Start: 2022-03-28 — End: 2022-03-29

## 2022-03-28 MED ORDER — LORAZEPAM 2 MG/ML IJ SOLN: 2.0000 mg | INTRAMUSCULAR | Status: DC | PRN

## 2022-03-28 NOTE — Progress Notes (Addendum)
Brief note - saw patient this morning, minimally responsive but can follow commands. POA not answering phone. Hypotensive d/t severe sepsis BP 62/50 when I was in the room w/ patient. Running 1L bolus fluids now and reached out to ICU re: consult for possible transfer to their service for pressor support, spoke directly w/ Darel Hong NP. Full progress note to follow.

## 2022-03-28 NOTE — Progress Notes (Addendum)
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 Patient presented to the ED [facility]/11/2022 from [facility] with chief complaint SOB.   08/25: In the ED, hypotensive, hypoxic on NRB, tachycardic, tachypneic, temp 97.7, lactate trending up, COVID and flu negative, hypoglycemic, anemic Hgb 9.9.  Received dextrose, started on broad-spectrum antibiotics with cefepime, metronidazole, vancomycin.  Placed on BiPAP.  Patient's POA, as designated on advanced directive, Ms. [name], was at bedside and confirms DNR/DNI.  Initially not ready for comfort measures. Reviewed recent medical records: per psych, limited capacity. Certainly now cannot understand/communicate her options. Advanced directive is on file, HCPOA listed [name].  08/26: Continued to decompensate, significant hypotension, received 1 L bolus, ICU consulted to consider pressors.  I was able to reach Ms. Epperson by phone.  She had already spoken to nephrologist.  She relayed to me that she understands that the patient's prognosis is very poor, she has been sick for a long time and is likely to keep getting recurrent infections and that her current situation is dire.  She states that the patient would not want to go through aggressive measures if it is not likely to result in meaningful recovery.  She asks about possibility of recovery.  I told her that we certainly do have the option to continue aggressive treatment with antibiotics and possibly put patient in ICU on pressors, she may recover enough to receive dialysis but she may not.  Even if she  were to recover enough to receive dialysis and may be recovering from this infection, her overall debilitated state, chronic wounds, ESRD, DM to place her at significant risk for future infection and will probably have a similar recurrence given her multiple recent hospitalizations.  Ms. Cline Cools states that the patient would want to be "not aware of what is going on," and to be placed on full comfort measures.  I explained that this would involve putting patient on morphine to reduce pain/air hunger, Ativan to reduce anxiety, oxygen for comfort but we would remove the BiPAP once on adequate morphine/Ativan, we would discontinue antibiotics and other medications, discontinue lab draws and vital signs.  I expect that the patient would pass fairly soon after BiPAP is removed and antibiotics were discontinued.  Ms. Cline Cools confirms that patient would not want a chaplain involved.  Ms. Cline Cools states that she lives too far away and is in such a state of health herself that she cannot be at bedside, but she states that as long as the patient is appropriately treated with above comfort measures, the patient would be comfortable with that.        Subjective:  Unable to assess due to patient condition.  See above for conversation with POA    Assessment/plan:  Principal Problem:   Severe sepsis ([place]) Active Problems:   Bacteremia due to methicillin resistant Staphylococcus epidermidis   Chronic pain syndrome   Swelling of left lower extremity   Leukocytosis   Essential hypertension   Type 2 diabetes mellitus with chronic kidney disease, with long-term current use of insulin (HCC)   Symptomatic anemia   Morbid (severe) obesity due to excess calories ([place])  Uncontrolled type 2 diabetes mellitus with hyperglycemia, with long-term current use of insulin (HCC)   Calciphylaxis   ESRD (end stage renal disease) (HCC)   Shortness of breath   Hypoglycemia   Acute respiratory failure with hypoxia  (HCC)   Failure to thrive in adult    Severe sepsis with associated acute respiratory failure, likely due to bacteremia due to MRSA, left lower lobe pneumonia.  Complicated by symptomatic anemia, calciphylaxis, hypoglycemia, ESRD, FTT, chronic sacral wounds.  Lactate, WBC trending up.  Blood pressure trending down. See above discussion with POA, comfort measures at this time       Objective:  Vitals:   03/05/2022 0800 03/31/2022 0900 03/26/2022 0930 03/13/2022 0941  BP: (!) 83/51 (!) 93/45 115/88   Pulse: (!) 108 (!) 110 (!) 116   Resp: 20 (!) 21 (!) 24   Temp:    98.7 F (37.1 C)  TempSrc:    Axillary  SpO2: 99% 100% 99%     Intake/Output Summary (Last 24 hours) at 03/25/2022 1123 Last data filed at 03/31/2022 7628 Gross per 24 hour  Intake 100 ml  Output --  Net 100 ml   There were no vitals filed for this visit.  Examination:  Constitutional:  VS as above General Appearance: Frail, respiratory distress Neck: No masses, trachea midline Respiratory: Increased respiratory effort (+) wheeze/ronchi Cardiovascular: S1/S2 normal, tachycardic Gastrointestinal: No tenderness Musculoskeletal:  No clubbing/cyanosis of digits Neurological: Somnolent, will respond to pain with eye opening        Scheduled Medications:    Continuous Infusions:  sodium chloride     azithromycin Stopped (03/30/2022 2108)   morphine      PRN Medications:  acetaminophen **OR** acetaminophen, glycopyrrolate **OR** glycopyrrolate **OR** glycopyrrolate, haloperidol lactate, LORazepam, morphine, ondansetron **OR** ondansetron (ZOFRAN) IV, polyvinyl alcohol, prochlorperazine  Antimicrobials:  Anti-infectives (From admission, onward)    Start     Dose/Rate Route Frequency Ordered Stop   10-Apr-2022 1900  vancomycin (VANCOREADY) IVPB 750 mg/150 mL  Status:  Discontinued        750 mg 150 mL/hr over 60 Minutes Intravenous Every 48 hours 03/07/2022 1753 03/09/2022 1112   03/31/2022 1400  ceFEPIme  (MAXIPIME) 2 g in sodium chloride 0.9 % 100 mL IVPB  Status:  Discontinued        2 g 200 mL/hr over 30 Minutes Intravenous Every 24 hours 03/21/2022 1754 03/26/2022 1112   03/11/2022 0500  metroNIDAZOLE (FLAGYL) IVPB 500 mg  Status:  Discontinued        500 mg 100 mL/hr over 60 Minutes Intravenous Every 12 hours 03/26/2022 1745 03/08/2022 1112   03/19/2022 1945  azithromycin (ZITHROMAX) 500 mg in sodium chloride 0.9 % 250 mL IVPB        500 mg 250 mL/hr over 60 Minutes Intravenous Daily 03/18/2022 1931 04/01/22 2159   03/03/2022 1800  vancomycin (VANCOREADY) IVPB 1500 mg/300 mL        1,500 mg 150 mL/hr over 120 Minutes Intravenous  Once 03/14/2022 1753 03/21/2022 2030   03/22/2022 1300  aztreonam (AZACTAM) 2 g in sodium chloride 0.9 % 100 mL IVPB  Status:  Discontinued        2 g 200 mL/hr over 30 Minutes Intravenous  Once 04/02/2022 1249 03/12/2022 1258   03/26/2022 1300  metroNIDAZOLE (FLAGYL) IVPB 500 mg        500 mg 100 mL/hr over 60 Minutes Intravenous  Once 03/08/2022 1249  1700   03/16/2022 1300  vancomycin (VANCOCIN) IVPB 1000 mg/200  mL premix        1,000 mg 200 mL/hr over 60 Minutes Intravenous  Once 03/19/2022 1249 03/16/2022 1700   03/21/2022 1300  ceFEPIme (MAXIPIME) 2 g in sodium chloride 0.9 % 100 mL IVPB        2 g 200 mL/hr over 30 Minutes Intravenous  Once 03/11/2022 1259 03/15/2022 1457       Data Reviewed: I have personally reviewed following labs and imaging studies  CBC: Recent Labs  Lab 03/03/2022 1355 03/14/2022 0525  WBC 17.7* 33.5*  NEUTROABS 16.2*  --   HGB 9.9* 8.5*  HCT 32.6* 27.2*  MCV 95.9 95.4  PLT 329 578   Basic Metabolic Panel: Recent Labs  Lab 03/13/2022 1355 03/20/2022 0525  NA 144 138  K 3.2* 3.7  CL 106 105  CO2 26 23  GLUCOSE 37* 298*  BUN 19 24*  CREATININE 2.95* 3.19*  CALCIUM 9.2 8.3*   GFR: Estimated Creatinine Clearance: 18.9 mL/min (A) (by C-G formula based on SCr of 3.19 mg/dL (H)). Liver Function Tests: Recent Labs  Lab 03/07/2022 1355  AST 53*   ALT 49*  ALKPHOS 295*  BILITOT 0.9  PROT 6.4*  ALBUMIN 1.9*   No results for input(s): "LIPASE", "AMYLASE" in the last 168 hours. No results for input(s): "AMMONIA" in the last 168 hours. Coagulation Profile: Recent Labs  Lab 03/23/2022 1355  INR 1.3*   Cardiac Enzymes: No results for input(s): "CKTOTAL", "CKMB", "CKMBINDEX", "TROPONINI" in the last 168 hours. BNP (last 3 results) No results for input(s): "PROBNP" in the last 8760 hours. HbA1C: No results for input(s): "HGBA1C" in the last 72 hours. CBG: Recent Labs  Lab 03/12/2022 1553 03/20/2022 1752 03/21/2022 2126 03/26/2022 0002 03/19/2022 0831  GLUCAP 133* 142* 188* 203* 329*   Lipid Profile: No results for input(s): "CHOL", "HDL", "LDLCALC", "TRIG", "CHOLHDL", "LDLDIRECT" in the last 72 hours. Thyroid Function Tests: No results for input(s): "TSH", "T4TOTAL", "FREET4", "T3FREE", "THYROIDAB" in the last 72 hours. Anemia Panel: No results for input(s): "VITAMINB12", "FOLATE", "FERRITIN", "TIBC", "IRON", "RETICCTPCT" in the last 72 hours. Urine analysis:    Component Value Date/Time   COLORURINE YELLOW (A) 01/02/2022 0038   APPEARANCEUR CLOUDY (A) 01/02/2022 0038   LABSPEC 1.013 01/02/2022 0038   PHURINE 5.0 01/02/2022 0038   GLUCOSEU NEGATIVE 01/02/2022 0038   HGBUR SMALL (A) 01/02/2022 0038   BILIRUBINUR NEGATIVE 01/02/2022 0038   KETONESUR NEGATIVE 01/02/2022 0038   PROTEINUR 100 (A) 01/02/2022 0038   NITRITE NEGATIVE 01/02/2022 0038   LEUKOCYTESUR NEGATIVE 01/02/2022 0038   Sepsis Labs: @LABRCNTIP (procalcitonin:4,lacticidven:4)  Recent Results (from the past 240 hour(s))  Resp Panel by RT-PCR (Flu A&B, Covid) Anterior Nasal Swab     Status: None   Collection Time: 03/26/2022  1:55 PM   Specimen: Anterior Nasal Swab  Result Value Ref Range Status   SARS Coronavirus 2 by RT PCR NEGATIVE NEGATIVE Final    Comment: (NOTE) SARS-CoV-2 target nucleic acids are NOT DETECTED.  The SARS-CoV-2 RNA is generally  detectable in upper respiratory specimens during the acute phase of infection. The lowest concentration of SARS-CoV-2 viral copies this assay can detect is 138 copies/mL. A negative result does not preclude SARS-Cov-2 infection and should not be used as the sole basis for treatment or other patient management decisions. A negative result may occur with  improper specimen collection/handling, submission of specimen other than nasopharyngeal swab, presence of viral mutation(s) within the areas targeted by this assay, and inadequate number of viral copies(<138 copies/mL). A  negative result must be combined with clinical observations, patient history, and epidemiological information. The expected result is Negative.  Fact Sheet for Patients:  EntrepreneurPulse.com.au  Fact Sheet for Healthcare Providers:  IncredibleEmployment.be  This test is no t yet approved or cleared by the [place] FDA and  has been authorized for detection and/or diagnosis of SARS-CoV-2 by FDA under an Emergency Use Authorization (EUA). This EUA will remain  in effect (meaning this test can be used) for the duration of the COVID-19 declaration under Section 564(b)(1) of the Act, 21 U.S.C.section 360bbb-3(b)(1), unless the authorization is terminated  or revoked sooner.       Influenza A by PCR NEGATIVE NEGATIVE Final   Influenza B by PCR NEGATIVE NEGATIVE Final    Comment: (NOTE) The Xpert Xpress SARS-CoV-2/FLU/RSV plus assay is intended as an aid in the diagnosis of influenza from Nasopharyngeal swab specimens and should not be used as a sole basis for treatment. Nasal washings and aspirates are unacceptable for Xpert Xpress SARS-CoV-2/FLU/RSV testing.  Fact Sheet for Patients: EntrepreneurPulse.com.au  Fact Sheet for Healthcare Providers: IncredibleEmployment.be  This test is not yet approved or cleared by the [place] FDA  and has been authorized for detection and/or diagnosis of SARS-CoV-2 by FDA under an Emergency Use Authorization (EUA). This EUA will remain in effect (meaning this test can be used) for the duration of the COVID-19 declaration under Section 564(b)(1) of the Act, 21 U.S.C. section 360bbb-3(b)(1), unless the authorization is terminated or revoked.  Performed at [facility], [address] [place], [place] [address]   Blood Culture (routine x 2)     Status: None (Preliminary result)   Collection Time: 03/23/2022  1:55 PM   Specimen: BLOOD RIGHT ARM  Result Value Ref Range Status   Specimen Description BLOOD RIGHT ARM  Final   Special Requests   Final    BOTTLES DRAWN AEROBIC AND ANAEROBIC Blood Culture adequate volume   Culture   Final    NO GROWTH < 24 HOURS Performed at [facility], [address] [place], [place] [address]    Report Status PENDING  Incomplete  Blood Culture (routine x 2)     Status: None (Preliminary result)   Collection Time: 03/25/2022  2:26 PM   Specimen: Left Antecubital; Blood  Result Value Ref Range Status   Specimen Description LEFT ANTECUBITAL  Final   Special Requests   Final    BOTTLES DRAWN AEROBIC AND ANAEROBIC Blood Culture adequate volume   Culture   Final    NO GROWTH < 24 HOURS Performed at [facility], [address] [place], [place] [address]    Report Status PENDING  Incomplete         Radiology Studies: DG Chest Port 1 View  Result Date: 03/23/2022 CLINICAL DATA:  Decreased responsiveness, gurgling breaths, diabetes mellitus, hypertension, chronic kidney disease EXAM: PORTABLE CHEST 1 VIEW COMPARISON:  Portable exam 1304 hours compared to 11/23 FINDINGS: Severely rotated to the LEFT. Enlargement of cardiac silhouette. Stable RIGHT jugular catheter, tip projecting over RIGHT atrium. Bibasilar infiltrates, greater on LEFT. Upper lungs clear. No pleural effusion or pneumothorax. IMPRESSION: Bibasilar  pulmonary infiltrates greater on LEFT. Electronically Signed   By: Lavonia Dana M.D.   On: 03/10/2022 13:15            LOS: 1 day    Time spent: [place], DO [facility] 03/26/2022, 11:23 AM   Staff may message me via  secure chat in [place]  but this may not receive immediate response,  please page for urgent matters!  If 7PM-7AM, please contact night-coverage [email or web address]  Dictation software was used to generate the above note. Typos may occur and escape review, as with typed/written notes. Please contact Dr Sheppard Coil directly for clarity if needed.

## 2022-03-28 NOTE — Progress Notes (Signed)
[facility]  PROGRESS NOTE   Subjective:    Patient is a 66 year old Caucasian female with a past medical history of end-stage renal disease, necrotic sacral lesion thought to be from calciphylaxis, morbid obesity, anemia of chronic disease, diabetes mellitus type 2, hypertension, end-stage renal disease on hemodialysis on Tuesday Thursday Saturday schedule who was sent to the ER with chief complaint of altered mental status. [facility] from where it was thought that patient was much less responsive than her baseline and patient was sent to the ER. Upon evaluation in the ER patient was found to be hypotensive with blood pressure of 80/45, patient was thought to be in severe sepsis and was admitted for further care. Patient was seen today in the ER..  Nephrology was consulted for comanagement of dialysis patient. Patient was seen in the ER.  Patient remained unresponsive and not able to give any complaints. Patient's data has been completed through chart review/primary team/health power of attorney/nursing team    Objective:  Vital signs: Blood pressure (!) 92/56, pulse (!) 108, temperature 98.4 F (36.9 C), temperature source Axillary, resp. rate (!) 21, SpO2 94 %.  Intake/Output Summary (Last 24 hours) at 03/21/2022 0917 Last data filed at 03/06/2022 9935 Gross per 24 hour  Intake 100 ml  Output --  Net 100 ml   There were no vitals filed for this visit.    Physical Exam: General:  Lethargic not responsive  Head:  Normocephalic, atraumatic. Moist oral mucosal membranes, BiPAP in situ  Eyes:  Anicteric  Lungs:   Decreased breath sounds at bases  Heart:  S1S2 no rubs Distant  Abdomen:   Soft, nontender, bowel sounds present  Extremities: 2+ peripheral edema.  Neurologic:  Lethargic/unable to assess  Skin:  Multiple calciphylaxis lesions on the thighs bilaterally  Access: Right chest PermCath    Basic Metabolic Panel: Recent Labs  Lab  03/11/2022 1355 03/05/2022 0525  NA 144 138  K 3.2* 3.7  CL 106 105  CO2 26 23  GLUCOSE 37* 298*  BUN 19 24*  CREATININE 2.95* 3.19*  CALCIUM 9.2 8.3*    CBC: Recent Labs  Lab 03/31/2022 1355 03/15/2022 0525  WBC 17.7* 33.5*  NEUTROABS 16.2*  --   HGB 9.9* 8.5*  HCT 32.6* 27.2*  MCV 95.9 95.4  PLT 329 180     Urinalysis: No results for input(s): "COLORURINE", "LABSPEC", "PHURINE", "GLUCOSEU", "HGBUR", "BILIRUBINUR", "KETONESUR", "PROTEINUR", "UROBILINOGEN", "NITRITE", "LEUKOCYTESUR" in the last 72 hours.  Invalid input(s): "APPERANCEUR"    Imaging: DG Chest Port 1 View  Result Date: 03/16/2022 CLINICAL DATA:  Decreased responsiveness, gurgling breaths, diabetes mellitus, hypertension, chronic kidney disease EXAM: PORTABLE CHEST 1 VIEW COMPARISON:  Portable exam 1304 hours compared to 11/23 FINDINGS: Severely rotated to the LEFT. Enlargement of cardiac silhouette. Stable RIGHT jugular catheter, tip projecting over RIGHT atrium. Bibasilar infiltrates, greater on LEFT. Upper lungs clear. No pleural effusion or pneumothorax. IMPRESSION: Bibasilar pulmonary infiltrates greater on LEFT. Electronically Signed   By: Lavonia Dana M.D.   On: 03/04/2022 13:15     Medications:    azithromycin Stopped (03/26/2022 2108)   ceFEPime (MAXIPIME) IV     dextrose Stopped (03/24/2022 7017)   metronidazole Stopped (03/26/2022 7939)   sodium chloride Stopped (04/02/2022 1512)   [START ON 13-Apr-2022] vancomycin      gabapentin  200 mg Oral TID   heparin  5,000 Units Subcutaneous Q8H   insulin aspart  0-5 Units Subcutaneous QHS   insulin aspart  0-6 Units Subcutaneous TID WC   midodrine  10 mg Oral TID WC   risperiDONE  0.5 mg Oral BID    Assessment/ Plan:     Principal Problem:   Severe sepsis ([place]) Active Problems:   Chronic pain syndrome   Essential hypertension   Morbid (severe) obesity due to excess calories ([place])   Type 2 diabetes mellitus with chronic kidney disease, with long-term  current use of insulin ([place])   Uncontrolled type 2 diabetes mellitus with hyperglycemia, with long-term current use of insulin (HCC)   Symptomatic anemia   Swelling of left lower extremity   Calciphylaxis   ESRD (end stage renal disease) (HCC)   Leukocytosis   Bacteremia due to methicillin resistant Staphylococcus epidermidis   Shortness of breath   Hypoglycemia   Acute respiratory failure with hypoxia ([place])   Failure to thrive in adult  [name] is a 66 y.o.  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  Patient presents to the emergency department with mental status changes and shortness of breath.  [facility]/TTS/Rt Permcath   #1: End-stage renal disease on hemodialysis:   Patient is hypotensive. Patient will need vasopressors. Patient is currently not hyperkalemic, acidotic or in fluid overload.  We will hold off on starting patient on dialysis today   #2: Hypokalemia: Patient was earlier hypokalemic Patient potassium is now better   #3: Calciphylaxis/necrotic sacral wounds: Continue sodium thiosulfate with dialysis treatments.     #4: Anemia with chronic kidney disease:         Patient hemoglobin is not at goal Patient hemoglobin is low at 8.5 We will continue patient on Epogen  #5: Septic shock Patient is admitted with severe sepsis. Patient will require vasopressors Patient will require ICU admission.  Patient has just been evaluated by the intensivist/ICU team  #6 Hypoxia Patient is currently on BiPAP Patient is DNR/DNI Patient does not wish for intubation  #7Morbid obesity Patient is being followed by the primary team  #8 prognosis  Patient has poor prognosis secondary to multiple comorbidities.  Patient is admitted with severe sepsis, associated with acute respiratory failure, anemia,  calciphylaxis, end-stage renal disease, chronic sacral wounds.  Patient is currently DNR/DNI.    I had extensive discussion with the patient's health power of attorney, ICU team and the primary team.  After discussion patient's health power of Attorney decided to request the patient be placed on comfort measures as per the desire expressed by patient's in the past  I spent more than 45 minutes in patient's critical care /care coordination   LOS: 1 Theresa Warren s [facility] 8/26/20239:17 AM

## 2022-03-28 NOTE — Consult Note (Signed)
Name: Theresa Warren MRN: [id number] DOB: Aug 18, 1955     CONSULTATION DATE: 03/14/2022  REFERRING MD :  Sheppard Coil  CHIEF COMPLAINT:  AMS, Septic Shock  STUDIES:  IMPRESSION: Bibasilar pulmonary infiltrates greater on LEFT.   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 The patient was recently dismissed 8/19 with staph epi sepsis and was chronically being treated with vanc with during HD. The patient is minimally arousable at this time and can provide no history, but notations reveal that she had been conversational as late as the day before admission. Once in the ED she was placed on NIV requiring high FiO2 after her CXR revealed bilateral interstitial infiltrates and effusions L>R. There was a terrific leukocytosis this morning at 33.5, essentially doubling from yesterday although she has been afebrile during this time. The patient had earlier been made DNR/DNI, but her [facility] decided it was not yet time for comfort measures, although aware of her poor prognosis.   ER Course The patient is obtunded. Her blood pressure continues to be soft, and she continues to require high fiO2 on NIV.   PAST MEDICAL HISTORY :   has a past medical history of CKD (chronic kidney disease), stage III ([place]), Diabetes ([place]), Hyperlipidemia, Hypertension, and Obesity.  has a past surgical history that includes Urethra dilation; Ankle surgery; Achilles tendon repair; Incision and drainage abscess (Bilateral, 01/01/2022); and DIALYSIS/PERMA CATHETER INSERTION (N/A, 01/09/2022). Prior to Admission medications   Medication Sig Start Date End Date Taking? Authorizing Provider  acetaminophen (TYLENOL) 325 MG tablet Take 975 mg by mouth every 6 (six) hours as needed for mild pain.   Yes [provider]  allopurinol  (ZYLOPRIM) 100 MG tablet Take 100 mg by mouth daily.   Yes [provider]  ascorbic acid (VITAMIN C) 500 MG tablet Take 1 tablet (500 mg total) by mouth 2 (two) times daily. 02/10/22  Yes Enzo Bi, MD  atorvastatin (LIPITOR) 40 MG tablet Take 40 mg by mouth daily.   Yes [provider]  cetirizine (ZYRTEC) 10 MG tablet Take 10 mg by mouth daily.   Yes [provider]  epoetin alfa (EPOGEN) 10000 UNIT/ML injection Inject 1 mL (10,000 Units total) into the vein every Monday, Wednesday, and Friday with hemodialysis. 02/11/22  Yes Enzo Bi, MD  fentaNYL (DURAGESIC) 25 MCG/HR Place 1 patch onto the skin every 3 (three) days.   Yes [provider]  fluticasone (FLONASE) 50 MCG/ACT nasal spray Place 2 sprays into both nostrils daily. 11/27/14 04/02/2022 Yes Mungal, Vishal, MD  gabapentin (NEURONTIN) 100 MG capsule Take 2 capsules (200 mg total) by mouth 3 (three) times daily. 02/10/22  Yes Enzo Bi, MD  insulin glargine-yfgn (SEMGLEE) 100 UNIT/ML injection Inject 0.15 mLs (15 Units total) into the skin at bedtime. Patient taking differently: Inject 3-15 Units into the skin at bedtime. 3 units tid with meals 15 units at bedtime 02/10/22  Yes Enzo Bi, MD  metoprolol tartrate (LOPRESSOR) 25 MG tablet Take 1 tablet (25 mg total) by mouth 2 (two) times daily. 12/23/21  Yes Sharen Hones, MD  multivitamin (RENA-VIT) TABS tablet Take 1 tablet by mouth at bedtime. 02/10/22  Yes Enzo Bi, MD  nutrition supplement, JUVEN, (JUVEN) PACK Take 1 packet by mouth 2 (two) times daily between meals. 02/10/22  Yes Enzo Bi, MD  nystatin (MYCOSTATIN/NYSTOP)  powder Apply 1 Application topically daily. 10/28/21  Yes [provider]  Omega-3 Fatty Acids (FISH OIL) 500 MG CAPS Take by mouth.   Yes [provider]  oxyCODONE (OXY IR/ROXICODONE) 5 MG immediate release tablet Take 1 tablet (5 mg total) by mouth every 6 (six) hours as needed for moderate pain or severe pain (can give 10  mg 30 minutes before dressing change). SNF use only.  Refills to be considered by SNF staff 03/02/22  Yes Priscella Mann, Sudheer B, MD  polyethylene glycol (MIRALAX / GLYCOLAX) 17 g packet Take 17 g by mouth daily. 02/11/22  Yes Enzo Bi, MD  psyllium (HYDROCIL/METAMUCIL) 95 % PACK Take 1 packet by mouth daily. 03/02/22  Yes Sreenath, Sudheer B, MD  risperiDONE (RISPERDAL) 0.5 MG tablet Take 1 tablet (0.5 mg total) by mouth 2 (two) times daily. 03/21/22  Yes Allie Bossier, MD  senna-docusate (SENOKOT-S) 8.6-50 MG tablet Take 1 tablet by mouth 2 (two) times daily. 02/10/22  Yes Enzo Bi, MD  simethicone (MYLICON) 80 MG chewable tablet Chew 1 tablet (80 mg total) by mouth 4 (four) times daily. 03/02/22  Yes Sreenath, Sudheer B, MD  sodium chloride 0.9 % SOLN 100 mL with sodium thiosulfate 250 MG/ML SOLN 25 g Inject 25 g into the vein Every Tuesday,Thursday,and Saturday with dialysis. 03/21/22  Yes Allie Bossier, MD  vancomycin (VANCOCIN) 1-5 GM/200ML-% SOLN Inject 200 mLs (1,000 mg total) into the vein Every Tuesday,Thursday,and Saturday with dialysis. 03/21/22  Yes Allie Bossier, MD  zinc sulfate 220 (50 Zn) MG capsule Take 220 mg by mouth daily.   Yes [provider]  Ensure Max Protein (ENSURE MAX PROTEIN) LIQD Take 330 mLs (11 oz total) by mouth 2 (two) times daily. Patient not taking: Reported on 03/09/2022 02/10/22   Enzo Bi, MD  haloperidol (HALDOL) 1 MG tablet Take 1 tablet (1 mg total) by mouth every 6 (six) hours as needed for agitation. 03/21/22 03/21/23  Allie Bossier, MD   Allergies  Allergen Reactions   Codeine Nausea And Vomiting    Other reaction(s): nausea and vomiting, vomiting   Penicillins Rash and Hives    Other reaction(s): systemic rash   Carvedilol     Other reaction(s): high sugar, headache, cough   Fluoxetine     Other reaction(s): took in 1990 . made her worse.   Liraglutide     Other reaction(s): too nauseated.   Pioglitazone     Other reaction(s): edema and  osteoporosis   Sulfa Antibiotics Swelling   Clindamycin Hcl Rash   Clindamycin/Lincomycin Rash   Dilaudid [Hydromorphone Hcl] Rash    FAMILY HISTORY:  family history includes ALS in her mother; Alzheimer's disease in her father; CAD in an other family member; CAD (age of onset: 22) in her father; Diabetes in her father; Lung cancer in her sister. SOCIAL HISTORY:  reports that she has never smoked. She does not have any smokeless tobacco history on file. She reports that she does not currently use alcohol. She reports that she does not currently use drugs.  REVIEW OF SYSTEMS:   Unable to obtain due to critical illness  Pressure Injury 12/20/21 Sacrum Left Unstageable - Full thickness tissue loss in which the base of the injury is covered by slough (yellow, tan, gray, green or brown) and/or eschar (tan, brown or black) in the wound bed. (Active)  12/20/21 0800  Location: Sacrum  Location Orientation: Left  Staging: Unstageable - Full thickness tissue loss in which the  base of the injury is covered by slough (yellow, tan, gray, green or brown) and/or eschar (tan, brown or black) in the wound bed.  Wound Description (Comments):   Present on Admission: Yes (First day with patient. Unknown if present at admission.)  Dressing Type Gauze (Comment) 03/21/22 1240     Pressure Injury 01/01/22 Buttocks Left Unstageable - Full thickness tissue loss in which the base of the injury is covered by slough (yellow, tan, gray, green or brown) and/or eschar (tan, brown or black) in the wound bed. (Active)  01/01/22 0222  Location: Buttocks  Location Orientation: Left  Staging: Unstageable - Full thickness tissue loss in which the base of the injury is covered by slough (yellow, tan, gray, green or brown) and/or eschar (tan, brown or black) in the wound bed.  Wound Description (Comments):   Present on Admission: Yes  Dressing Type ABD 03/21/22 1240     Pressure Injury 01/01/22 Buttocks Right Unstageable -  Full thickness tissue loss in which the base of the injury is covered by slough (yellow, tan, gray, green or brown) and/or eschar (tan, brown or black) in the wound bed. (Active)  01/01/22 0223  Location: Buttocks  Location Orientation: Right  Staging: Unstageable - Full thickness tissue loss in which the base of the injury is covered by slough (yellow, tan, gray, green or brown) and/or eschar (tan, brown or black) in the wound bed.  Wound Description (Comments):   Present on Admission: Yes  Dressing Type ABD 03/21/22 1240     Pressure Injury 01/01/22 Hip Left Unstageable - Full thickness tissue loss in which the base of the injury is covered by slough (yellow, tan, gray, green or brown) and/or eschar (tan, brown or black) in the wound bed. (Active)  01/01/22 0225  Location: Hip  Location Orientation: Left  Staging: Unstageable - Full thickness tissue loss in which the base of the injury is covered by slough (yellow, tan, gray, green or brown) and/or eschar (tan, brown or black) in the wound bed.  Wound Description (Comments):   Present on Admission: Yes  Dressing Type Gauze (Comment) 03/21/22 1240     Pressure Injury 01/01/22 Hip Right Unstageable - Full thickness tissue loss in which the base of the injury is covered by slough (yellow, tan, gray, green or brown) and/or eschar (tan, brown or black) in the wound bed. (Active)  01/01/22 0225  Location: Hip  Location Orientation: Right  Staging: Unstageable - Full thickness tissue loss in which the base of the injury is covered by slough (yellow, tan, gray, green or brown) and/or eschar (tan, brown or black) in the wound bed.  Wound Description (Comments):   Present on Admission: Yes  Dressing Type Gauze (Comment) 03/21/22 1240     Pressure Injury 01/12/22 Thigh Left;Upper Unstageable - Full thickness tissue loss in which the base of the injury is covered by slough (yellow, tan, gray, green or brown) and/or eschar (tan, brown or black) in  the wound bed. previously noted "rugburns"  (Active)  01/12/22   Location: Thigh  Location Orientation: Left;Upper  Staging: Unstageable - Full thickness tissue loss in which the base of the injury is covered by slough (yellow, tan, gray, green or brown) and/or eschar (tan, brown or black) in the wound bed.  Wound Description (Comments): previously noted "rugburns" have evolved  Present on Admission: Yes  Dressing Type Gauze (Comment) 03/21/22 1240     Pressure Injury 01/12/22 Thigh Left;Posterior Unstageable - Full thickness tissue loss in which  the base of the injury is covered by slough (yellow, tan, gray, green or brown) and/or eschar (tan, brown or black) in the wound bed. previously noted " rugbu (Active)  01/12/22   Location: Thigh  Location Orientation: Left;Posterior  Staging: Unstageable - Full thickness tissue loss in which the base of the injury is covered by slough (yellow, tan, gray, green or brown) and/or eschar (tan, brown or black) in the wound bed.  Wound Description (Comments): previously noted " rugburns" have evolved  Present on Admission: Yes  Dressing Type Gauze (Comment) 03/21/22 1240     Pressure Injury 02/05/22 Thigh Right;Lateral;Upper Unstageable - Full thickness tissue loss in which the base of the injury is covered by slough (yellow, tan, gray, green or brown) and/or eschar (tan, brown or black) in the wound bed. (Active)  02/05/22 0500  Location: Thigh  Location Orientation: Right;Lateral;Upper  Staging: Unstageable - Full thickness tissue loss in which the base of the injury is covered by slough (yellow, tan, gray, green or brown) and/or eschar (tan, brown or black) in the wound bed.  Wound Description (Comments):   Present on Admission: Yes  Dressing Type Gauze (Comment) 03/21/22 1240     Estimated body mass index is 37.58 kg/m as calculated from the following:   Height as of 03/14/22: 5' 2"  (1.575 m).   Weight as of 03/19/22: 93.2 kg.    VITAL  SIGNS: Temp:  [97.7 F (36.5 C)-98.6 F (37 C)] 98.4 F (36.9 C) (08/26 0130) Pulse Rate:  [108-130] 110 (08/26 0900) Resp:  [18-33] 21 (08/26 0900) BP: (79-110)/(45-95) 93/45 (08/26 0900) SpO2:  [85 %-100 %] 100 % (08/26 0900)   I/O last 3 completed shifts: In: 100 [IV Piggyback:100] Out: -  No intake/output data recorded.   SpO2: 100 % O2 Flow Rate (L/min): 15 L/min   REVIEW OF SYSTEMS  PATIENT IS UNABLE TO PROVIDE COMPLETE REVIEW OF SYSTEMS DUE TO SEVERE CRITICAL ILLNESS     PHYSICAL EXAMINATION:  GENERAL:critically ill appearing, tachypnea EYES: Pupils equal, round, reactive to light.  No scleral icterus.  MOUTH: Moist mucosal membrane. NIV mask NECK: Supple.  PULMONARY: +rhonchi,diminished CARDIOVASCULAR: S1 and S2.  No murmurs  GASTROINTESTINAL: Large globular. Soft, nontender, -distended. Positive bowel sounds.  MUSCULOSKELETAL: No swelling, clubbing, 2-3+ LE edema.  NEUROLOGIC: obtunded SKIN: multiple eschar like wound across the upper thighs    I personally reviewed lab work that was obtained in last 24 hrs. CXR Independently reviewed-interstitial edema and effusions   MEDICATIONS: I have reviewed all medications and confirmed regimen as documented   CULTURE RESULTS   Recent Results (from the past 240 hour(s))  Resp Panel by RT-PCR (Flu A&B, Covid) Anterior Nasal Swab     Status: None   Collection Time: 03/26/2022  1:55 PM   Specimen: Anterior Nasal Swab  Result Value Ref Range Status   SARS Coronavirus 2 by RT PCR NEGATIVE NEGATIVE Final    Comment: (NOTE) SARS-CoV-2 target nucleic acids are NOT DETECTED.  The SARS-CoV-2 RNA is generally detectable in upper respiratory specimens during the acute phase of infection. The lowest concentration of SARS-CoV-2 viral copies this assay can detect is 138 copies/mL. A negative result does not preclude SARS-Cov-2 infection and should not be used as the sole basis for treatment or other patient management  decisions. A negative result may occur with  improper specimen collection/handling, submission of specimen other than nasopharyngeal swab, presence of viral mutation(s) within the areas targeted by this assay, and inadequate number of viral  copies(<138 copies/mL). A negative result must be combined with clinical observations, patient history, and epidemiological information. The expected result is Negative.  Fact Sheet for Patients:  EntrepreneurPulse.com.au  Fact Sheet for Healthcare Providers:  IncredibleEmployment.be  This test is no t yet approved or cleared by the [place] FDA and  has been authorized for detection and/or diagnosis of SARS-CoV-2 by FDA under an Emergency Use Authorization (EUA). This EUA will remain  in effect (meaning this test can be used) for the duration of the COVID-19 declaration under Section 564(b)(1) of the Act, 21 U.S.C.section 360bbb-3(b)(1), unless the authorization is terminated  or revoked sooner.       Influenza A by PCR NEGATIVE NEGATIVE Final   Influenza B by PCR NEGATIVE NEGATIVE Final    Comment: (NOTE) The Xpert Xpress SARS-CoV-2/FLU/RSV plus assay is intended as an aid in the diagnosis of influenza from Nasopharyngeal swab specimens and should not be used as a sole basis for treatment. Nasal washings and aspirates are unacceptable for Xpert Xpress SARS-CoV-2/FLU/RSV testing.  Fact Sheet for Patients: EntrepreneurPulse.com.au  Fact Sheet for Healthcare Providers: IncredibleEmployment.be  This test is not yet approved or cleared by the [place] FDA and has been authorized for detection and/or diagnosis of SARS-CoV-2 by FDA under an Emergency Use Authorization (EUA). This EUA will remain in effect (meaning this test can be used) for the duration of the COVID-19 declaration under Section 564(b)(1) of the Act, 21 U.S.C. section 360bbb-3(b)(1), unless the  authorization is terminated or revoked.  Performed at [facility], [address] [place], [place] [address]   Blood Culture (routine x 2)     Status: None (Preliminary result)   Collection Time: 03/16/2022  1:55 PM   Specimen: BLOOD RIGHT ARM  Result Value Ref Range Status   Specimen Description BLOOD RIGHT ARM  Final   Special Requests   Final    BOTTLES DRAWN AEROBIC AND ANAEROBIC Blood Culture adequate volume   Culture   Final    NO GROWTH < 24 HOURS Performed at [facility], [address] [place], [place] [address]    Report Status PENDING  Incomplete  Blood Culture (routine x 2)     Status: None (Preliminary result)   Collection Time: 03/26/2022  2:26 PM   Specimen: Left Antecubital; Blood  Result Value Ref Range Status   Specimen Description LEFT ANTECUBITAL  Final   Special Requests   Final    BOTTLES DRAWN AEROBIC AND ANAEROBIC Blood Culture adequate volume   Culture   Final    NO GROWTH < 24 HOURS Performed at [facility], [address] [place],  [address]    Report Status PENDING  Incomplete          IMAGING    DG Chest Port 1 View  Result Date: 03/03/2022 CLINICAL DATA:  Decreased responsiveness, gurgling breaths, diabetes mellitus, hypertension, chronic kidney disease EXAM: PORTABLE CHEST 1 VIEW COMPARISON:  Portable exam 1304 hours compared to 11/23 FINDINGS: Severely rotated to the LEFT. Enlargement of cardiac silhouette. Stable RIGHT jugular catheter, tip projecting over RIGHT atrium. Bibasilar infiltrates, greater on LEFT. Upper lungs clear. No pleural effusion or pneumothorax. IMPRESSION: Bibasilar pulmonary infiltrates greater on LEFT. Electronically Signed   By: Lavonia Dana M.D.   On: 03/05/2022 13:15      ASSESSMENT AND PLAN SYNOPSIS   Severe ACUTE Hypoxic Respiratory Failure -continue NIV support -Bronchodilator Therapy as needed -Increase NIV pressures and attempt to hold EPAP 10+ -will perform  SAT/SBT  when respiratory parameters are met -VAP/VENT bundle implementation  ACUTE SYSTOLIC CARDIAC FAILURE HFpEF ECHO 6/23  1. Left ventricular ejection fraction, by estimation, is 60 to 65%. Left  ventricular ejection fraction by 2D MOD biplane is 63.2 %. The left  ventricle has normal function. The left ventricle has no regional wall  motion abnormalities. There is mild  asymmetric left ventricular hypertrophy of the basal-septal segment. Left  ventricular diastolic parameters were normal.   2. Right ventricular systolic function is normal. The right ventricular  size is normal. Tricuspid regurgitation signal is inadequate for assessing  PA pressure.   3. The mitral valve is grossly normal. Trivial mitral valve  regurgitation. No evidence of mitral stenosis.   4. The aortic valve is tricuspid. There is mild calcification of the  aortic valve. Aortic valve regurgitation is trivial. Aortic valve  sclerosis is present, with no evidence of aortic valve stenosis.   5. The inferior vena cava is normal in size with greater than 50%  respiratory variability, suggesting right atrial pressure of 3 mmHg.  -oxygen and NIV -HD when able -follow up cardiac enzymes as indicated -follow up cardiology recs -caution with beta given tachycardia  Morbid obesity, OSA.   Will certainly impact respiratory mechanics, ventilator weaning Suspect will need to consider additional PEEP, possible extubation to BiPAP when appropriate to consider   ESRD -Nephrology following, follow recs -No HD today -Calciphylaxis     NEUROLOGY - obtunded -treat presumed sepsis   CARDIAC ICU monitoring  ID Skin wounds GPC sepsis  -maintain cefepime and vanc  -wound consult -perhaps surgical debridement  GI GI PROPHYLAXIS as indicated  NUTRITIONAL STATUS DIET-->TF's as tolerated Constipation protocol as indicated   ENDO - will use ICU hypoglycemic\Hyperglycemia protocol if needed  DVT/GI PRX ordered and  assessed TRANSFUSIONS AS NEEDED MONITOR FSBS I Assessed the need for Labs I Assessed the need for Foley I Assessed the need for Central Venous Line Family Discussion when available I Assessed the need for Mobilization I made an Assessment of medications to be adjusted accordingly Safety Risk assessment Completed   Course will depend on wishes at this point. It is certainly appropriate for a palliative care consult and comfort measures.    Critical Care Time devoted to patient care services described in this note is 50 minutes.   Overall, patient is critically ill, prognosis is guarded.  Patient with Multiorgan failure and at high risk for cardiac arrest and death.

## 2022-03-28 NOTE — ED Notes (Signed)
Report received, this RN now assuming care.  

## 2022-03-28 NOTE — ED Notes (Signed)
Secure chat sent to MD about blood pressure trending downwards

## 2022-03-29 ENCOUNTER — Encounter: Payer: Self-pay | Admitting: Internal Medicine

## 2022-03-29 DIAGNOSIS — A419 Sepsis, unspecified organism: Secondary | ICD-10-CM | POA: Diagnosis not present

## 2022-03-29 DIAGNOSIS — R6520 Severe sepsis without septic shock: Secondary | ICD-10-CM | POA: Diagnosis not present

## 2022-03-30 ENCOUNTER — Encounter: Payer: Medicare Other | Admitting: Obstetrics & Gynecology

## 2022-04-01 ENCOUNTER — Ambulatory Visit: Payer: Medicare Other | Admitting: Dermatology

## 2022-04-01 LAB — CULTURE, BLOOD (ROUTINE X 2)
Culture: NO GROWTH
Culture: NO GROWTH
Special Requests: ADEQUATE
Special Requests: ADEQUATE

## 2022-04-03 NOTE — Death Summary Note (Signed)
DEATH SUMMARY   Patient Details  Name: Theresa Warren MRN: [id number] DOB: [birth year] QJJ:HERDEY, Elta [place], MD Admission/Discharge Information   Admit Date:  04-06-22  Date of Death: Date of Death: 2022/04/07  Time of Death: Time of Death: 10/27/08  Length of Stay: 2   Principle Cause of death: sepsis  Severe sepsis with associated acute respiratory failure, likely due to bacteremia due to MRSA, left lower lobe pneumonia.  Complicated by symptomatic anemia, calciphylaxis, hypoglycemia, ESRD, FTT, chronic sacral wounds.   Hospital Diagnoses: Principal Problem:   Severe sepsis ([place]) Active Problems:   Bacteremia due to methicillin resistant Staphylococcus epidermidis   Chronic pain syndrome   Swelling of left lower extremity   Leukocytosis   Essential hypertension   Type 2 diabetes mellitus with chronic kidney disease, with long-term current use of insulin ([facility])   Symptomatic anemia   Morbid (severe) obesity due to excess calories ([place])   Uncontrolled type 2 diabetes mellitus with hyperglycemia, with long-term current use of insulin ([facility])   Calciphylaxis   ESRD (end stage renal disease) ([place])   Sepsis ([place])   Shortness of breath   Hypoglycemia   Acute respiratory failure with hypoxia ([place])   Failure to thrive in adult   Hospital Course:  This is a 66 year old female with history morbid obesity, deconditioning, nonambulatory at baseline, painful necrotic skin lesions/ulcers with suspected calciphylaxis, bilateral sacral decubitus wounds, ESRD on HD TTS, anemia of CKD/ESRD, HTN, HLD, insulin-dependent DM2. Patient presented to the ED 04/06/22 from SNF with chief complaint SOB.   04-07-2023: In the ED, hypotensive, hypoxic on NRB, tachycardic, tachypneic, temp 97.7, lactate trending up, COVID and flu negative, hypoglycemic, anemic Hgb 9.9.  Received dextrose, started on broad-spectrum antibiotics with cefepime, metronidazole, vancomycin.  Placed on BiPAP.  Patient's POA, as  designated on advanced directive, Ms. [name], was at bedside and confirms DNR/DNI.  Initially not ready for comfort measures. Reviewed recent medical records: per psych, limited capacity. Certainly now cannot understand/communicate her options. Advanced directive is on file, HCPOA listed [name].  04/08/23: Continued to decompensate, significant hypotension, received 1 L bolus, ICU consulted to consider pressors.  I was able to reach Ms. Epperson by phone.  She had already spoken to nephrologist.  She relayed to me that she understands that the patient's prognosis is very poor, she has been sick for a long time and is likely to keep getting recurrent infections and that her current situation is dire.  She states that the patient would not want to go through aggressive measures if it is not likely to result in meaningful recovery.  She asks about possibility of recovery.  I told her that we certainly do have the option to continue aggressive treatment with antibiotics and possibly put patient in ICU on pressors, she may recover enough to receive dialysis but she may not.  Even if she were to recover enough to receive dialysis and may be recovering from this infection, her overall debilitated state, chronic wounds, ESRD, DM to place her at significant risk for future infection and will probably have a similar recurrence given her multiple recent hospitalizations.  Ms. Cline Cools states that the patient would want to be "not aware of what is going on," and to be placed on full comfort measures.  I explained that this would involve putting patient on morphine to reduce pain/air hunger, Ativan to reduce anxiety, oxygen for comfort but we would remove the BiPAP once on adequate morphine/Ativan, we would discontinue antibiotics and  other medications, discontinue lab draws and vital signs.  I expect that the patient would pass fairly soon after BiPAP is removed and antibiotics were discontinued.  Ms. Cline Cools  confirms that patient would not want a chaplain involved.  Ms. Cline Cools states that she lives too far away and is in such a state of health herself that she cannot be at bedside, but she states that as long as the patient is appropriately treated with above comfort measures, the patient would be comfortable with that.     Assessment/plan:   Principal Problem:   Severe sepsis ([place]) Active Problems:   Bacteremia due to methicillin resistant Staphylococcus epidermidis   Chronic pain syndrome   Swelling of left lower extremity   Leukocytosis   Essential hypertension   Type 2 diabetes mellitus with chronic kidney disease, with long-term current use of insulin ([facility])   Symptomatic anemia   Morbid (severe) obesity due to excess calories ([place])   Uncontrolled type 2 diabetes mellitus with hyperglycemia, with long-term current use of insulin ([facility])   Calciphylaxis   ESRD (end stage renal disease) (H[facility])   Shortness of breath   Hypoglycemia   Acute respiratory failure with hypoxia ([facility])   Failure to thrive in adult   Severe sepsis with associated acute respiratory failure, likely due to bacteremia due to MRSA, left lower lobe pneumonia.  Complicated by symptomatic anemia, calciphylaxis, hypoglycemia, ESRD, FTT, chronic sacral wounds.  Lactate, WBC trending up.  Blood pressure trending down. See above discussion with POA, comfort measures at this time              Procedures: none  Consultations: Nephrology, ICU  The results of significant diagnostics from this hospitalization (including imaging, microbiology, ancillary and laboratory) are listed below for reference.   Significant Diagnostic Studies: DG Chest Port 1 View  Result Date: 03/26/2022 CLINICAL DATA:  Decreased responsiveness, gurgling breaths, diabetes mellitus, hypertension, chronic kidney disease EXAM: PORTABLE CHEST 1 VIEW COMPARISON:  Portable exam 1304 hours compared to 11/23 FINDINGS: Severely rotated to the LEFT.  Enlargement of cardiac silhouette. Stable RIGHT jugular catheter, tip projecting over RIGHT atrium. Bibasilar infiltrates, greater on LEFT. Upper lungs clear. No pleural effusion or pneumothorax. IMPRESSION: Bibasilar pulmonary infiltrates greater on LEFT. Electronically Signed   By: Lavonia Dana M.D.   On: 03/14/2022 13:15   CT HEAD WO CONTRAST (5MM)  Result Date: 03/18/2022 CLINICAL DATA:  Altered mental status, lethargy, neck pain EXAM: CT HEAD WITHOUT CONTRAST CT CERVICAL SPINE WITHOUT CONTRAST TECHNIQUE: Multidetector CT imaging of the head and cervical spine was performed following the standard protocol without intravenous contrast. Multiplanar CT image reconstructions of the cervical spine were also generated. RADIATION DOSE REDUCTION: This exam was performed according to the departmental dose-optimization program which includes automated exposure control, adjustment of the mA and/or kV according to patient size and/or use of iterative reconstruction technique. COMPARISON:  03/13/2022 FINDINGS: CT HEAD FINDINGS Brain: No evidence of acute infarction, hemorrhage, hydrocephalus, extra-axial collection or mass lesion/mass effect. Mild periventricular white matter hypodensity. Vascular: No hyperdense vessel or unexpected calcification. Skull: Normal. Negative for fracture or focal lesion. Sinuses/Orbits: No acute finding. Other: None. CT CERVICAL SPINE FINDINGS Alignment: Positional straightening of the normal cervical lordosis. Skull base and vertebrae: No acute fracture. No primary bone lesion or focal pathologic process. Soft tissues and spinal canal: No prevertebral fluid or swelling. No visible canal hematoma. Disc levels: Mild disc space height loss and osteophytosis of the cervical spine, worst from C4 through C6. Upper chest:  Negative. Other: None. IMPRESSION: 1. No acute intracranial pathology. Small-vessel white matter disease. 2. No fracture or static subluxation of the cervical spine. Mild  multilevel cervical disc degenerative disease. Electronically Signed   By: Delanna Ahmadi M.D.   On: 03/18/2022 14:21   CT CERVICAL SPINE WO CONTRAST  Result Date: 03/18/2022 CLINICAL DATA:  Altered mental status, lethargy, neck pain EXAM: CT HEAD WITHOUT CONTRAST CT CERVICAL SPINE WITHOUT CONTRAST TECHNIQUE: Multidetector CT imaging of the head and cervical spine was performed following the standard protocol without intravenous contrast. Multiplanar CT image reconstructions of the cervical spine were also generated. RADIATION DOSE REDUCTION: This exam was performed according to the departmental dose-optimization program which includes automated exposure control, adjustment of the mA and/or kV according to patient size and/or use of iterative reconstruction technique. COMPARISON:  03/13/2022 FINDINGS: CT HEAD FINDINGS Brain: No evidence of acute infarction, hemorrhage, hydrocephalus, extra-axial collection or mass lesion/mass effect. Mild periventricular white matter hypodensity. Vascular: No hyperdense vessel or unexpected calcification. Skull: Normal. Negative for fracture or focal lesion. Sinuses/Orbits: No acute finding. Other: None. CT CERVICAL SPINE FINDINGS Alignment: Positional straightening of the normal cervical lordosis. Skull base and vertebrae: No acute fracture. No primary bone lesion or focal pathologic process. Soft tissues and spinal canal: No prevertebral fluid or swelling. No visible canal hematoma. Disc levels: Mild disc space height loss and osteophytosis of the cervical spine, worst from C4 through C6. Upper chest: Negative. Other: None. IMPRESSION: 1. No acute intracranial pathology. Small-vessel white matter disease. 2. No fracture or static subluxation of the cervical spine. Mild multilevel cervical disc degenerative disease. Electronically Signed   By: Delanna Ahmadi M.D.   On: 03/18/2022 14:21   CT HEAD WO CONTRAST (5MM)  Result Date: 03/13/2022 CLINICAL DATA:  Mental status change,  unknown cause. Altered mental status. EXAM: CT HEAD WITHOUT CONTRAST TECHNIQUE: Contiguous axial images were obtained from the base of the skull through the vertex without intravenous contrast. RADIATION DOSE REDUCTION: This exam was performed according to the departmental dose-optimization program which includes automated exposure control, adjustment of the mA and/or kV according to patient size and/or use of iterative reconstruction technique. COMPARISON:  Head CT 02/22/2022 FINDINGS: Brain: There is no evidence of an acute infarct, intracranial hemorrhage, mass, midline shift, or extra-axial fluid collection. There is mild cerebral atrophy. Hypodensities in the cerebral white matter bilaterally are unchanged and nonspecific but compatible with mild chronic small vessel ischemic disease. A subcentimeter hypodensity at the inferior aspect of the right lentiform nucleus is unchanged and favored to reflect a dilated perivascular space over a chronic lacunar infarct. Vascular: Calcified atherosclerosis at the skull base. No hyperdense vessel. Skull: No fracture or suspicious osseous lesion. Sinuses/Orbits: Paranasal sinuses and mastoid air cells are clear. Unremarkable orbits. Other: None. IMPRESSION: 1. No evidence of acute intracranial abnormality. 2. Mild chronic small vessel ischemic disease. Electronically Signed   By: Logan Bores M.D.   On: 03/13/2022 16:24   DG Chest 2 View  Result Date: 03/13/2022 CLINICAL DATA:  Suspected sepsis. EXAM: CHEST - 2 VIEW COMPARISON:  PA chest and bilateral rib radiographs 02/24/2022 FINDINGS: Right internal jugular dual-lumen central venous catheter tips overlie the superior aspect of the right atrium. Cardiac silhouette and mediastinal contours are within normal limits. Mildly decreased lung volumes. Moderate bilateral interstitial thickening is greatest inferiorly. No pleural effusion pneumothorax. Mild multilevel degenerative disc changes of the thoracic spine. Note is  made of multiple posterolateral right mildly displaced fifth through eighth rib fractures on prior  radiographs not well visualized on the current study. IMPRESSION: Moderate bilateral interstitial thickening is new from prior. This may represent interstitial pulmonary edema. No focal pneumonia is seen. Electronically Signed   By: Yvonne Kendall M.D.   On: 03/13/2022 15:33    Microbiology: Recent Results (from the past 240 hour(s))  Resp Panel by RT-PCR (Flu A&B, Covid) Anterior Nasal Swab     Status: None   Collection Time: 03/07/2022  1:55 PM   Specimen: Anterior Nasal Swab  Result Value Ref Range Status   SARS Coronavirus 2 by RT PCR NEGATIVE NEGATIVE Final    Comment: (NOTE) SARS-CoV-2 target nucleic acids are NOT DETECTED.  The SARS-CoV-2 RNA is generally detectable in upper respiratory specimens during the acute phase of infection. The lowest concentration of SARS-CoV-2 viral copies this assay can detect is 138 copies/mL. A negative result does not preclude SARS-Cov-2 infection and should not be used as the sole basis for treatment or other patient management decisions. A negative result may occur with  improper specimen collection/handling, submission of specimen other than nasopharyngeal swab, presence of viral mutation(s) within the areas targeted by this assay, and inadequate number of viral copies(<138 copies/mL). A negative result must be combined with clinical observations, patient history, and epidemiological information. The expected result is Negative.  Fact Sheet for Patients:  EntrepreneurPulse.com.au  Fact Sheet for Healthcare Providers:  IncredibleEmployment.be  This test is no t yet approved or cleared by the [place] FDA and  has been authorized for detection and/or diagnosis of SARS-CoV-2 by FDA under an Emergency Use Authorization (EUA). This EUA will remain  in effect (meaning this test can be used) for the duration of  the COVID-19 declaration under Section 564(b)(1) of the Act, 21 U.S.C.section 360bbb-3(b)(1), unless the authorization is terminated  or revoked sooner.       Influenza A by PCR NEGATIVE NEGATIVE Final   Influenza B by PCR NEGATIVE NEGATIVE Final    Comment: (NOTE) The Xpert Xpress SARS-CoV-2/FLU/RSV plus assay is intended as an aid in the diagnosis of influenza from Nasopharyngeal swab specimens and should not be used as a sole basis for treatment. Nasal washings and aspirates are unacceptable for Xpert Xpress SARS-CoV-2/FLU/RSV testing.  Fact Sheet for Patients: EntrepreneurPulse.com.au  Fact Sheet for Healthcare Providers: IncredibleEmployment.be  This test is not yet approved or cleared by the [place] FDA and has been authorized for detection and/or diagnosis of SARS-CoV-2 by FDA under an Emergency Use Authorization (EUA). This EUA will remain in effect (meaning this test can be used) for the duration of the COVID-19 declaration under Section 564(b)(1) of the Act, 21 U.S.C. section 360bbb-3(b)(1), unless the authorization is terminated or revoked.  Performed at [facility], [address] [place], [place] [address]   Blood Culture (routine x 2)     Status: None (Preliminary result)   Collection Time: 03/30/2022  1:55 PM   Specimen: BLOOD RIGHT ARM  Result Value Ref Range Status   Specimen Description BLOOD RIGHT ARM  Final   Special Requests   Final    BOTTLES DRAWN AEROBIC AND ANAEROBIC Blood Culture adequate volume   Culture   Final    NO GROWTH 2 DAYS Performed at [facility], [address], [place], [place] [address]    Report Status PENDING  Incomplete  Blood Culture (routine x 2)     Status: None (Preliminary result)   Collection Time: 03/15/2022  2:26 PM   Specimen: Left Antecubital; Blood  Result Value  Ref Range Status   Specimen Description LEFT ANTECUBITAL  Final   Special Requests   Final     BOTTLES DRAWN AEROBIC AND ANAEROBIC Blood Culture adequate volume   Culture   Final    NO GROWTH 2 DAYS Performed at [facility], [address] [place], [place] [address]    Report Status PENDING  Incomplete    Time spent: 15 minutes  Signed: Emeterio Reeve, DO 04-28-22

## 2022-04-03 DEATH — deceased

## 2022-07-17 NOTE — Progress Notes (Unsigned)
{  Select_TRH_Note:[id number]} 

## 2023-08-14 IMAGING — DX DG CHEST 1V PORT
1 series · 1 of 1 positions shown · non-contrast
Comparison: 12/31/2021

CLINICAL DATA: New onset fever and shortness of breath.

EXAM:
PORTABLE CHEST 1 VIEW

[chest ap]
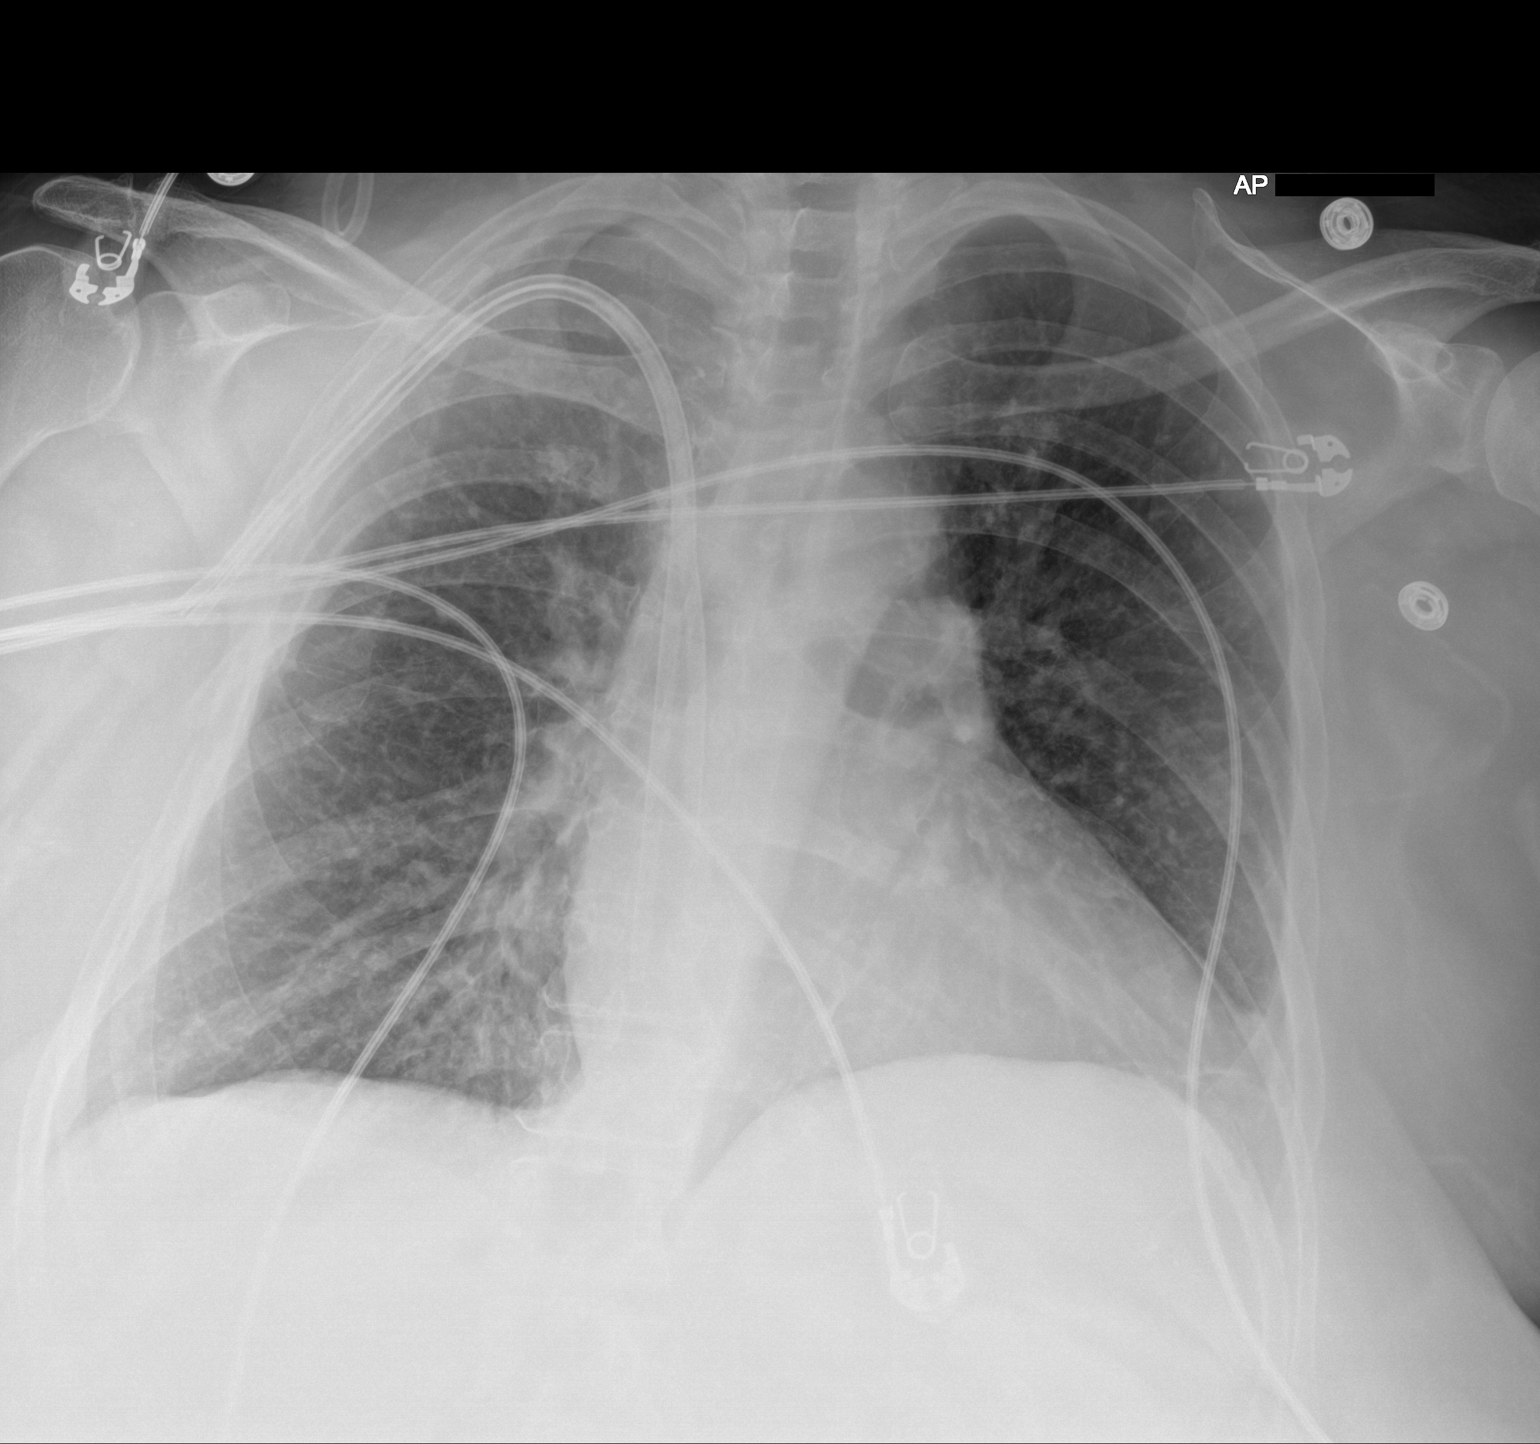

[1 of 1 positions shown; findings below may reference images not displayed]

FINDINGS: 5899 hours. Low volume film. Cardiopericardial silhouette is at
upper limits of normal for size. There is pulmonary vascular
congestion without overt pulmonary edema. Minimal atelectasis or
scarring noted left base. No overt edema or focal airspace
consolidation. Right IJ dialysis catheter tip overlies the right
atrium. Telemetry leads overlie the chest.
IMPRESSION: Pulmonary vascular congestion without overt pulmonary edema.

## 2023-08-15 IMAGING — DX DG CHEST 1V PORT
1 series · 1 of 1 positions shown · non-contrast
Comparison: Previous studies including the examination of
01/10/2022

CLINICAL DATA: Fever

EXAM:
PORTABLE CHEST 1 VIEW

[chest ap]
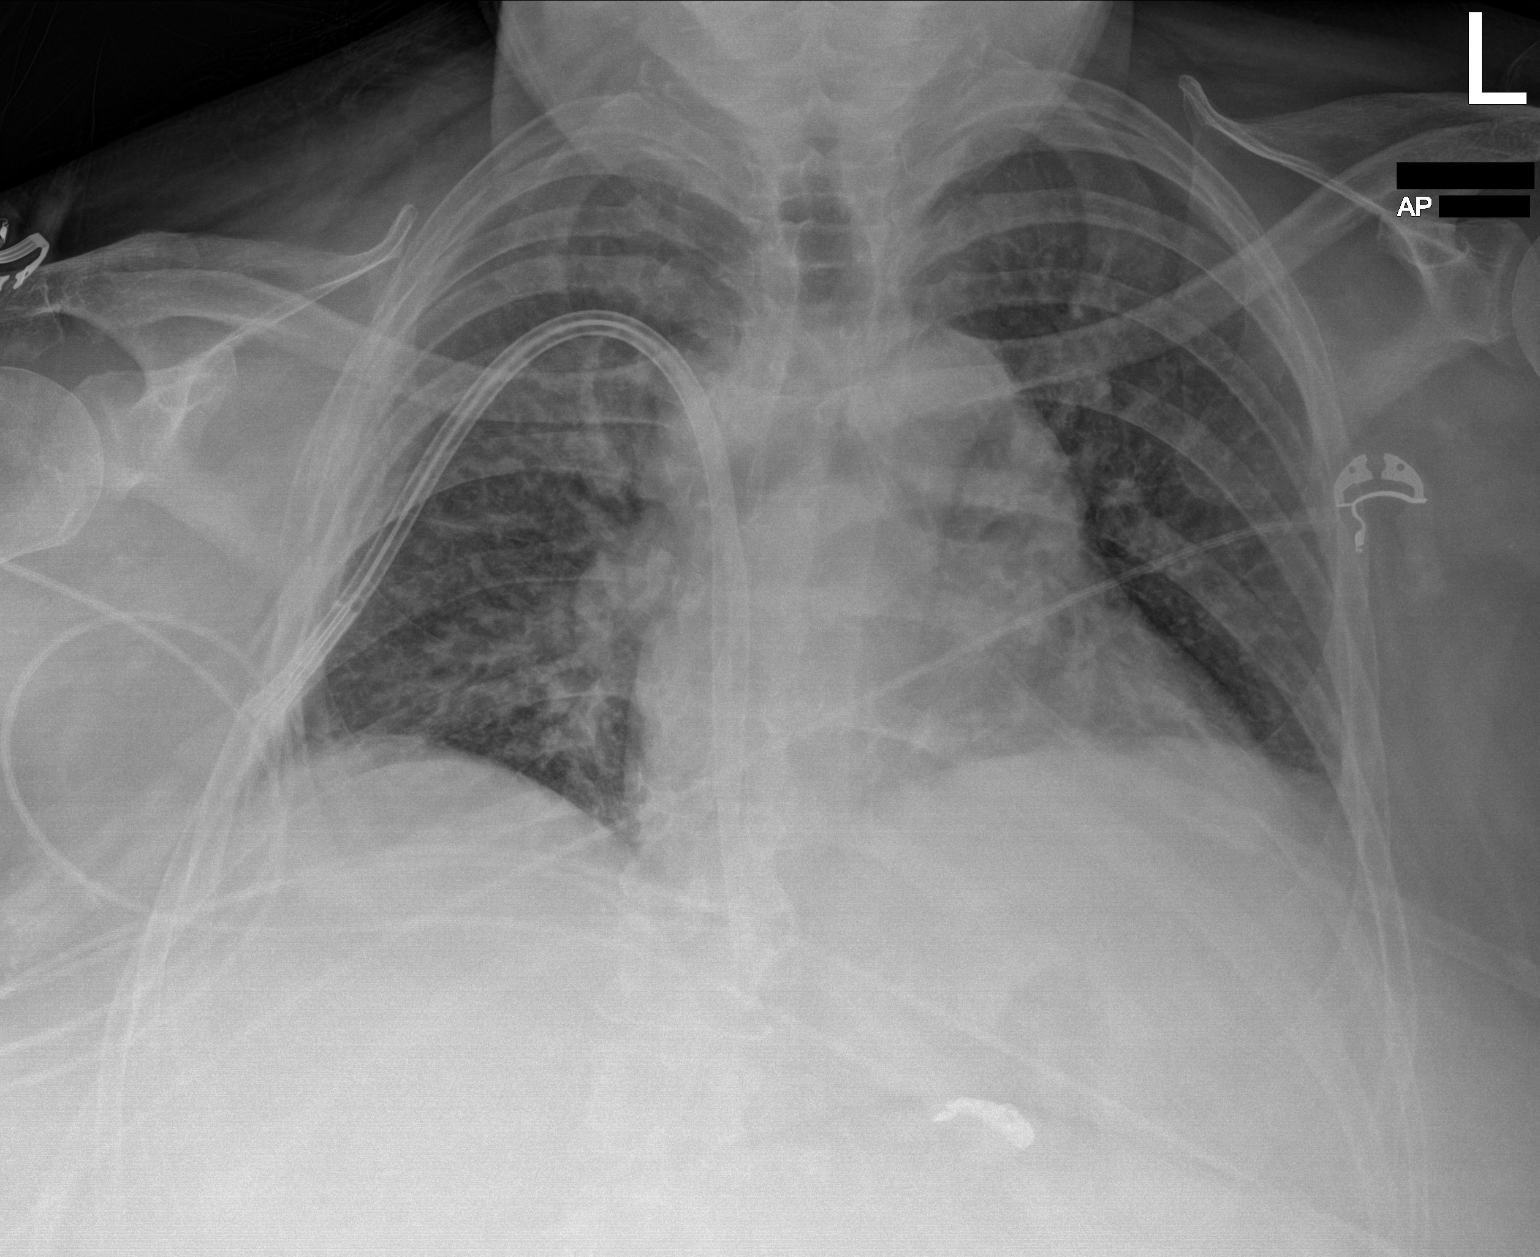

[1 of 1 positions shown; findings below may reference images not displayed]

FINDINGS: Transverse diameter of heart is increased. Central pulmonary vessels
are more prominent. This may be partly due to poor inspiration in
the current study. There is no focal pulmonary consolidation. There
is no pleural effusion or pneumothorax. Tip of dialysis catheter is
seen in the region of right atrium.
IMPRESSION: Central pulmonary vessels are more prominent which may be due to
poor inspiration or suggest CHF. No new focal pulmonary infiltrates
are seen. There is no pleural effusion.

## 2023-08-16 IMAGING — US US EXTREM LOW VENOUS
1 series · 14 of 24 positions shown · non-contrast
Comparison: None Available.

CLINICAL DATA: Swelling

EXAM:
BILATERAL LOWER EXTREMITY VENOUS DOPPLER ULTRASOUND
TECHNIQUE: Gray-scale sonography with compression, as well as color and duplex
ultrasound, were performed to evaluate the deep venous system(s)
from the level of the common femoral vein through the popliteal and
proximal calf veins.

[Series 1: us venous img lower bilat (dvt) · portal-venous · 14 of 53 slices shown]
[im 1/53]
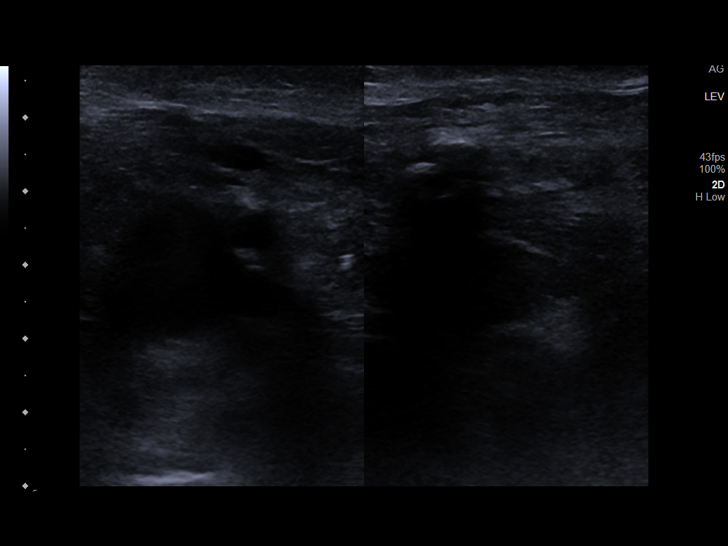
[im 5/53]
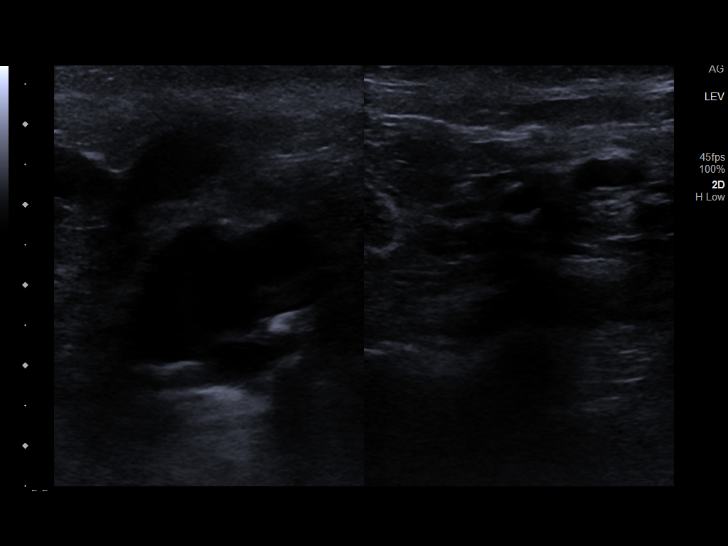
[im 10/53]
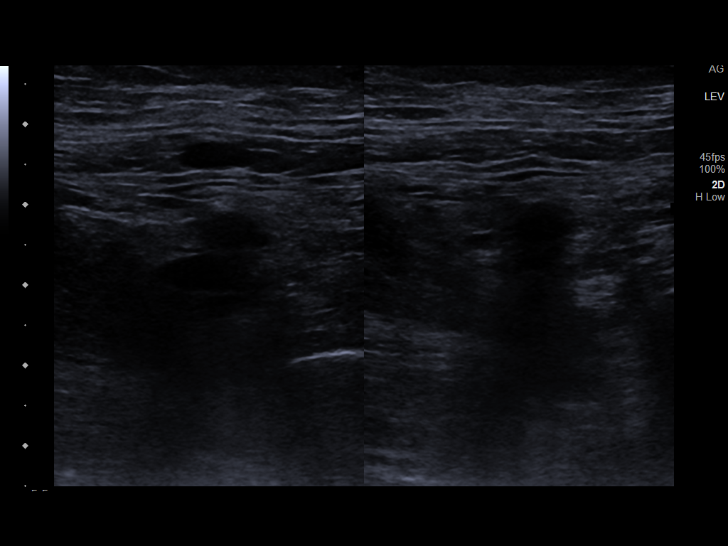
[im 14/53]
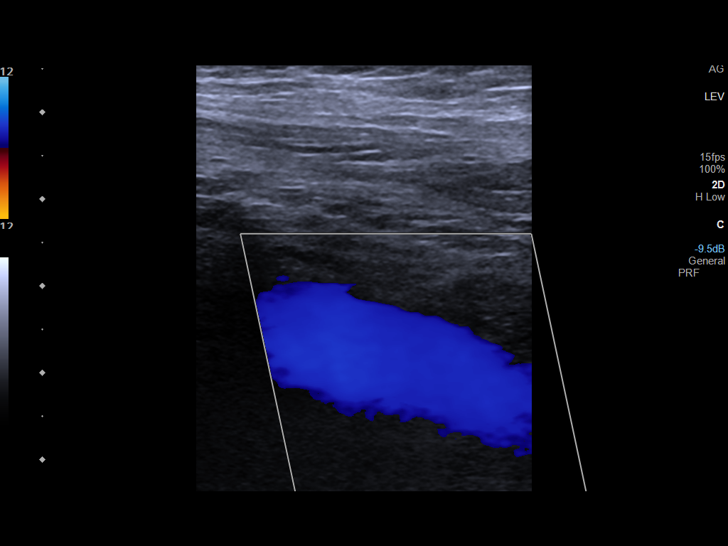
[im 16/53]
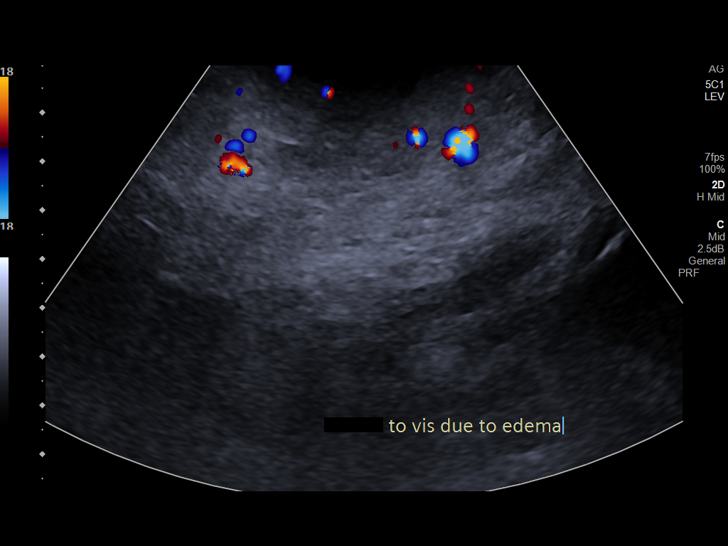
[im 21/53]
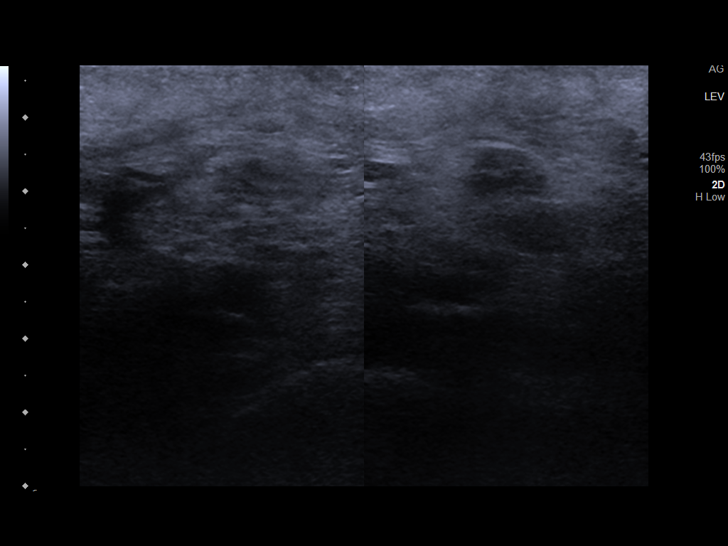
[im 25/53]
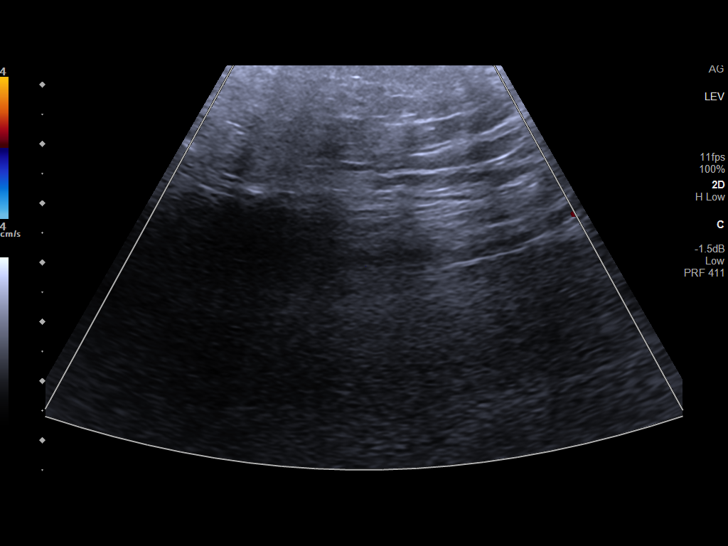
[im 28/53]
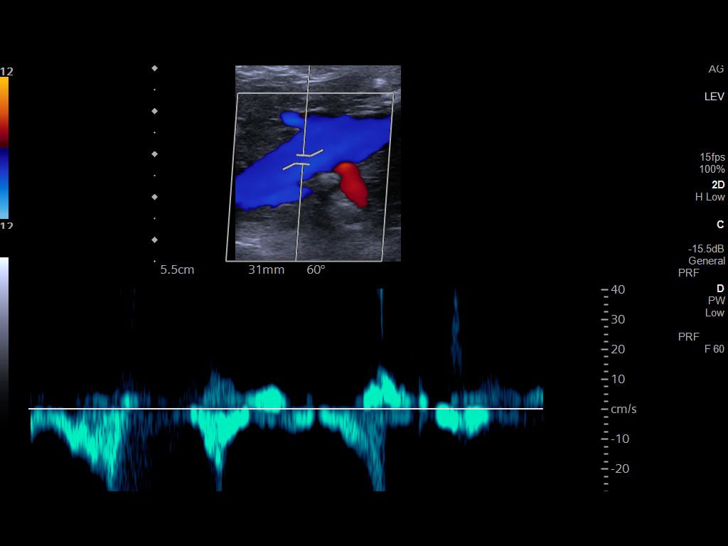
[im 32/53]
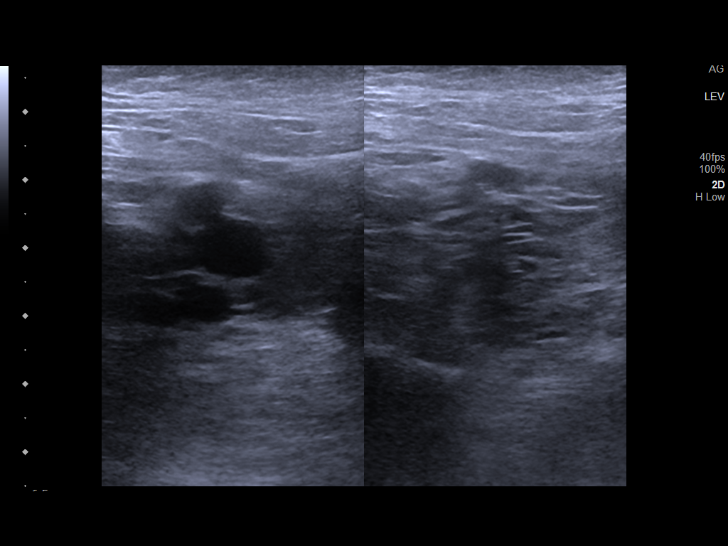
[im 37/53]
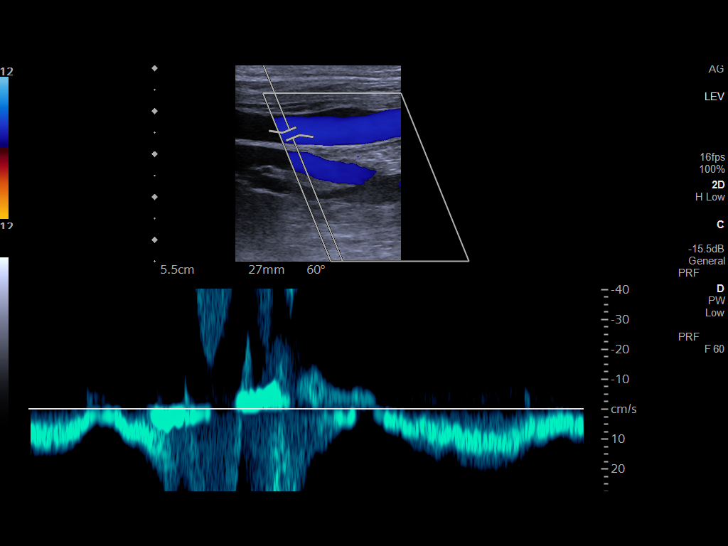
[im 41/53]
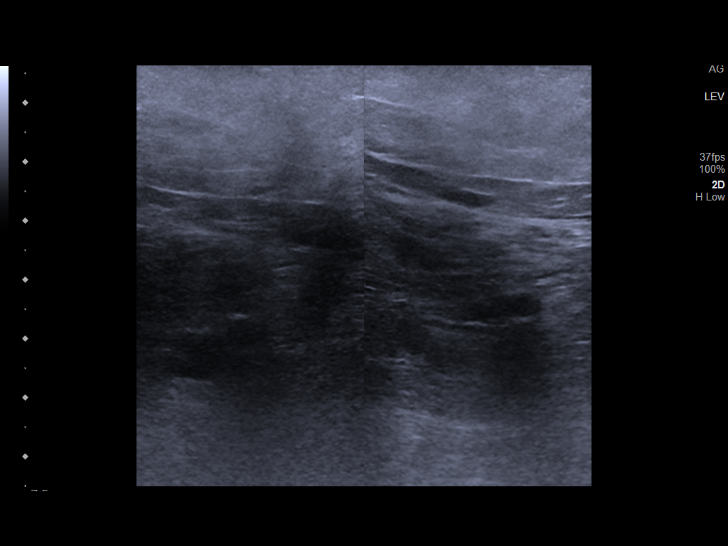
[im 43/53]
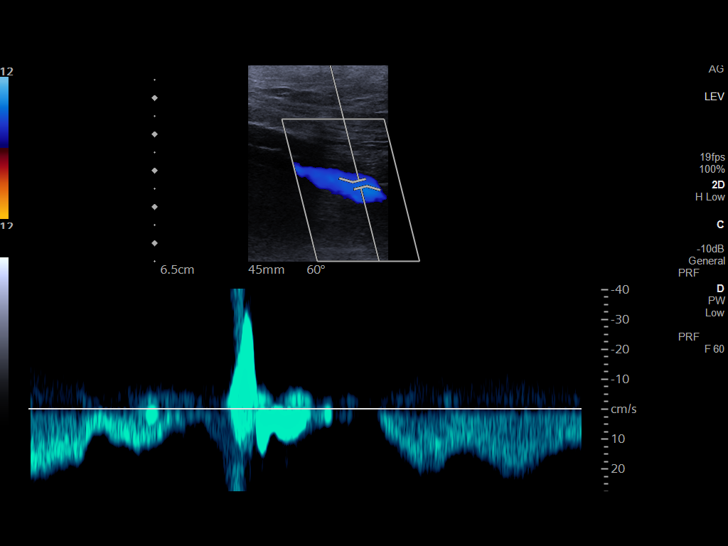
[im 48/53]
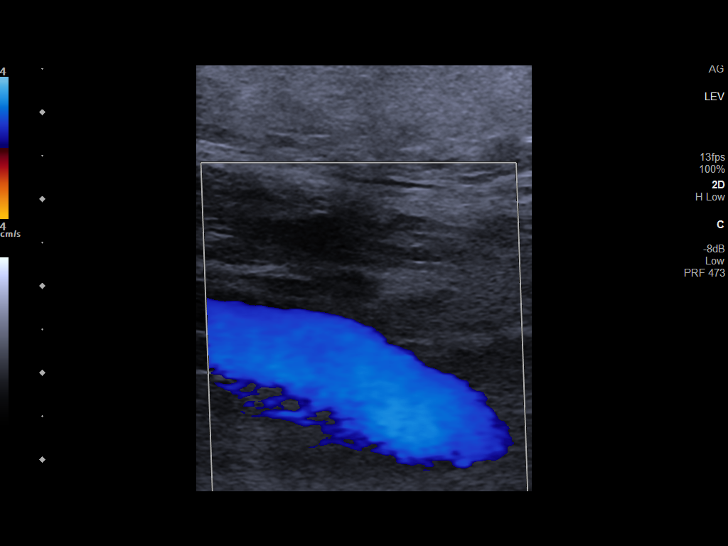
[im 53/53]
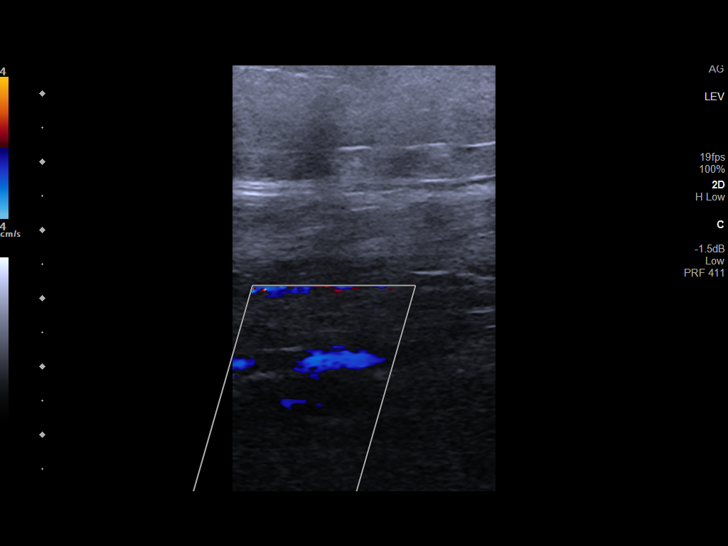

[14 of 24 positions shown; findings below may reference images not displayed]

FINDINGS: VENOUS

Normal compressibility of the common femoral, superficial femoral,
and popliteal veins, as well as the visualized calf veins.
Visualized portions of profunda femoral vein and great saphenous
vein unremarkable. No filling defects to suggest DVT on grayscale or
color Doppler imaging. Doppler waveforms show normal direction of
venous flow, normal respiratory plasticity and response to
augmentation.

Limited views of the contralateral common femoral vein are
unremarkable.

OTHER

None.

Limitations: Left distal femoral vein and calf veins not visualized
due to edema.
IMPRESSION: No evidence of lower extremity DVT with limitations in the left leg
as above.

## 2023-08-16 IMAGING — US US EXTREM LOW*L* LIMITED
1 series · 14 of 17 positions shown · non-contrast
Comparison: None Available.

CLINICAL DATA: Left leg swelling.  Evaluate thigh and calf

EXAM:
ULTRASOUND LEFT LOWER EXTREMITY LIMITED
TECHNIQUE: Ultrasound examination of the lower extremity soft tissues was
performed in the area of clinical concern.

[Series 1: us left lower extrem ltd soft tissue non vascular · 14 of 17 slices shown]
[im 1/17]
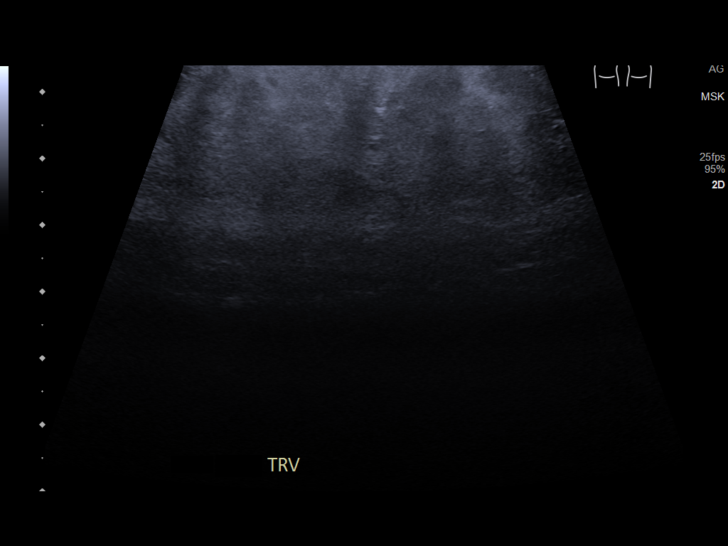
[im 2/17]
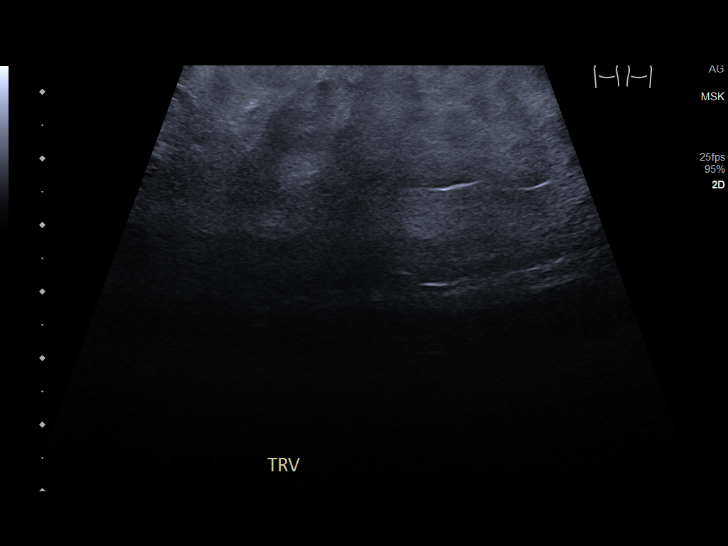
[im 4/17]
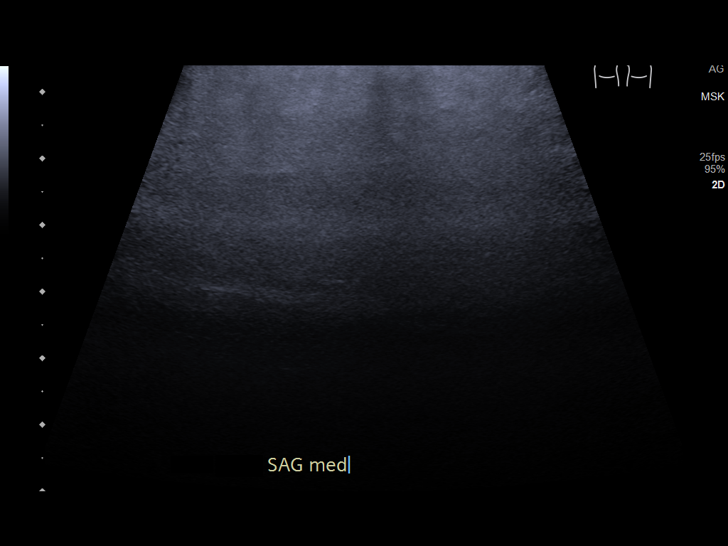
[im 5/17]
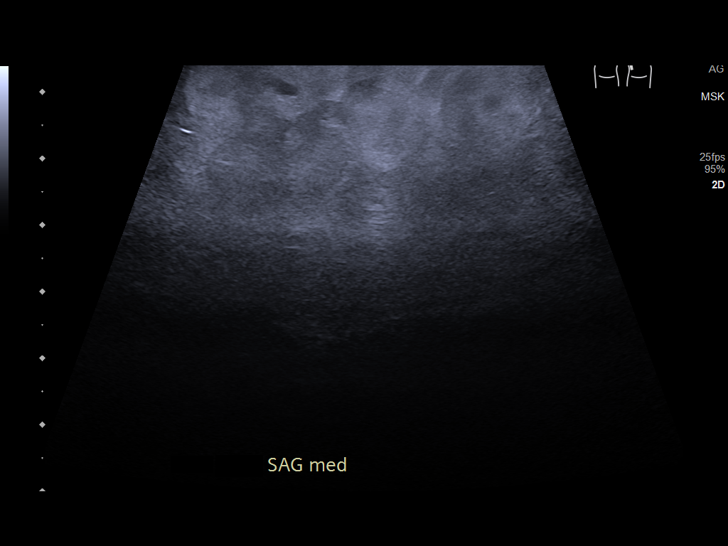
[im 6/17]
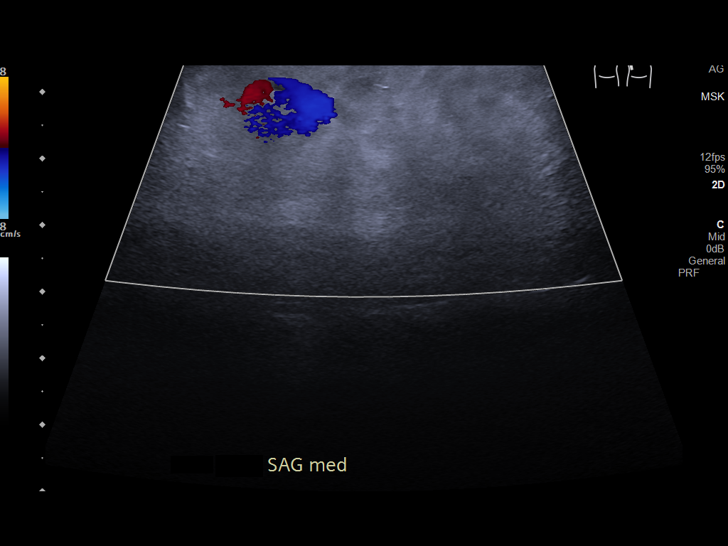
[im 7/17]
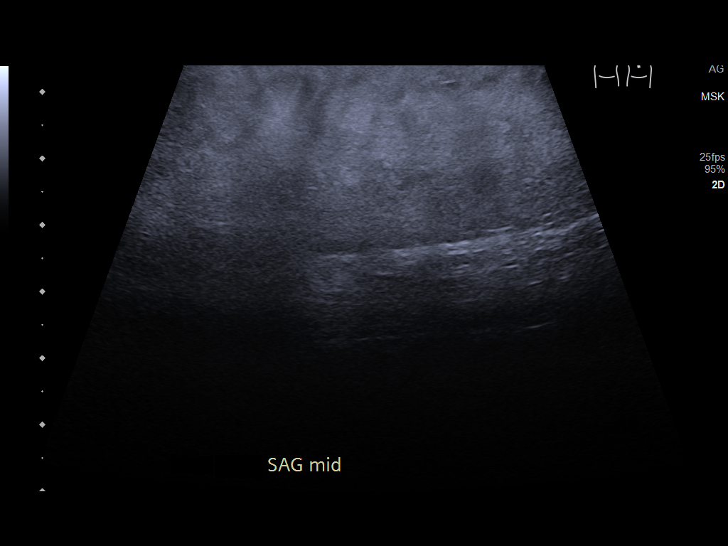
[im 8/17]
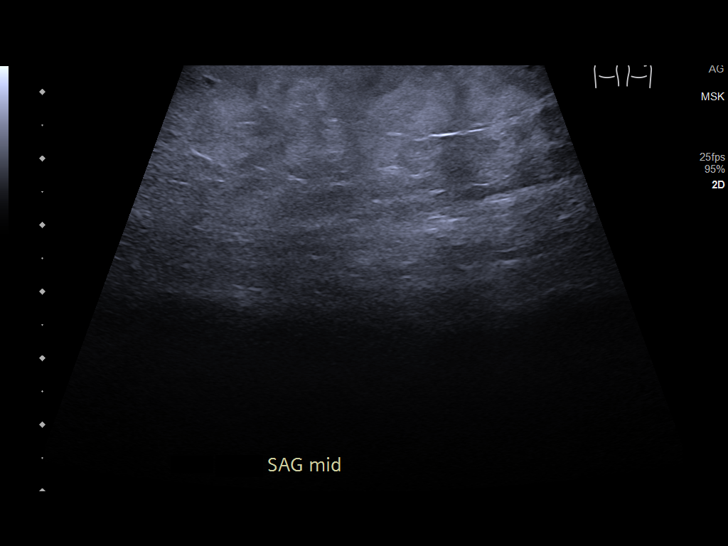
[im 10/17]
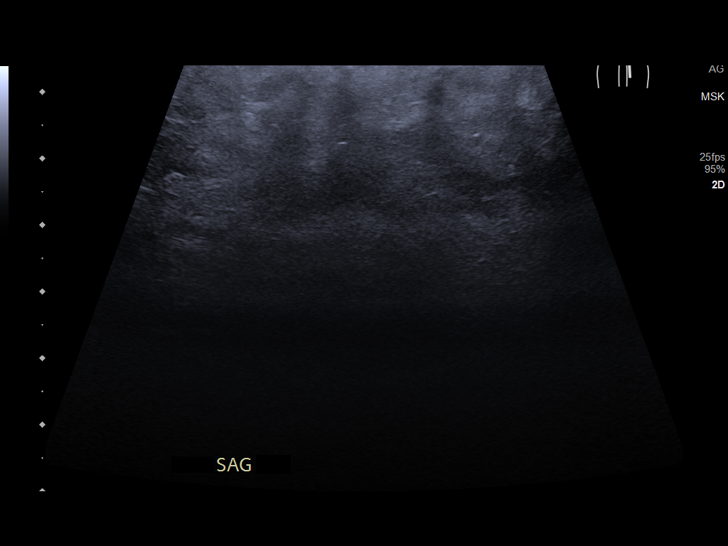
[im 11/17]
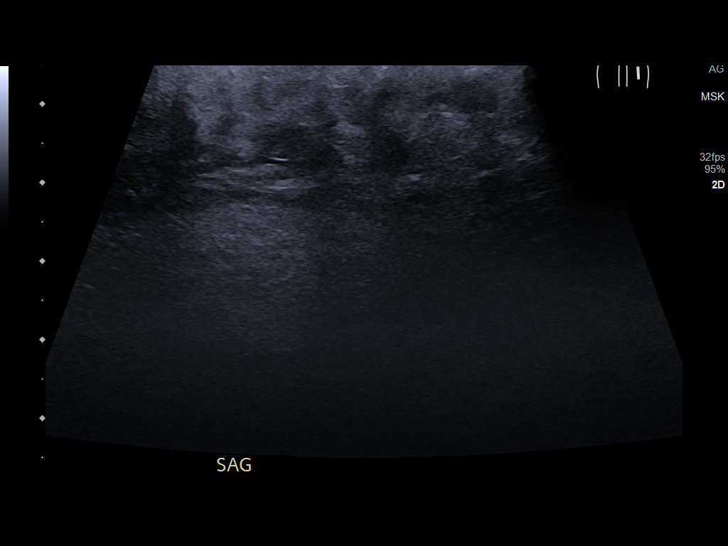
[im 12/17]
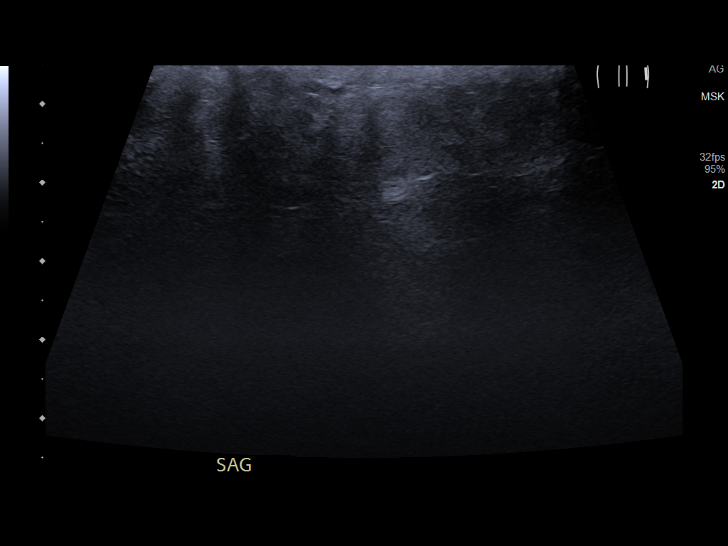
[im 13/17]
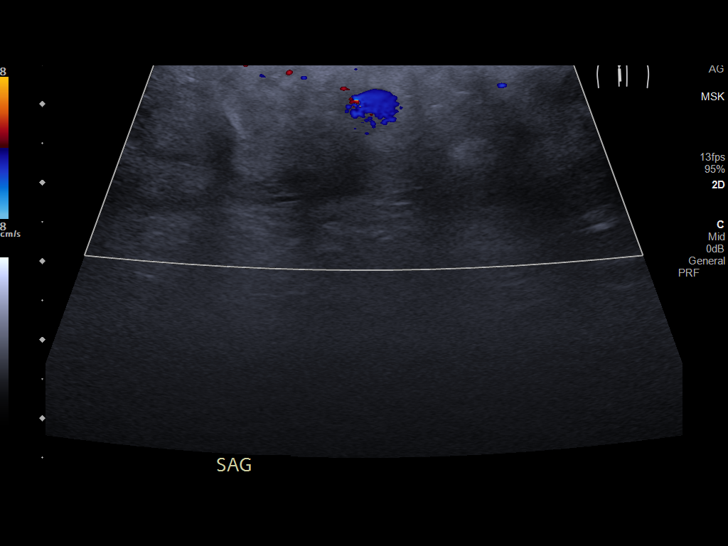
[im 14/17]
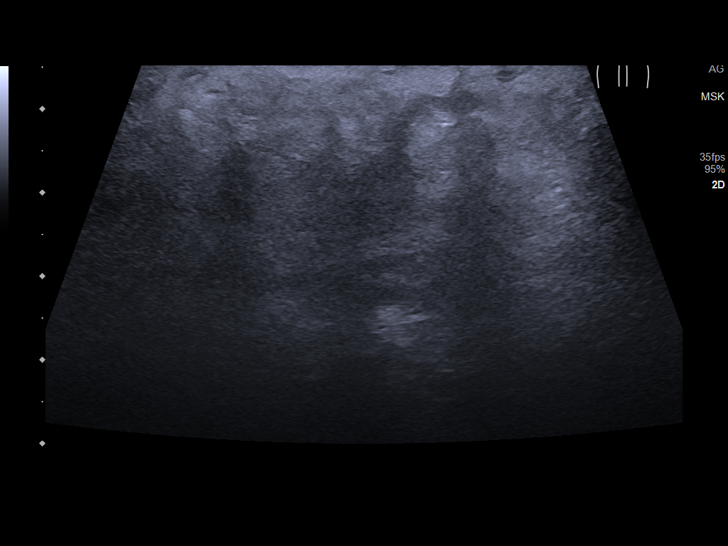
[im 16/17]
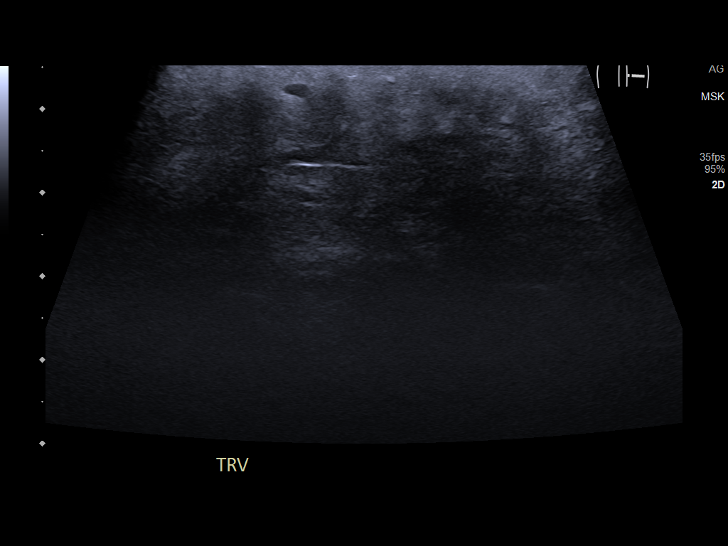
[im 17/17]
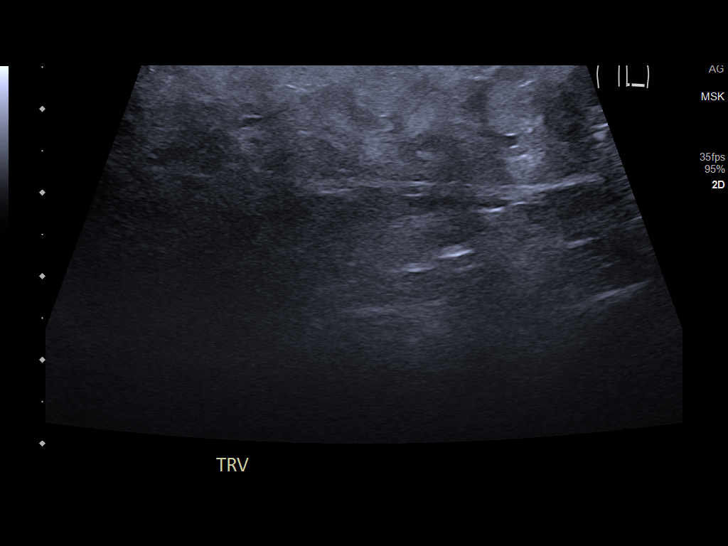

[14 of 17 positions shown; findings below may reference images not displayed]

FINDINGS: No focal mass or fluid collection in the areas of concern in the
left thigh and calf. Soft tissue edema.
IMPRESSION: No focal mass or fluid collection.

## 2023-08-16 IMAGING — US US EXTREM LOW*R* LIMITED
1 series · 14 of 24 positions shown · non-contrast
Comparison: None Available.

CLINICAL DATA: Right leg edema in thigh and calf

EXAM:
ULTRASOUND RIGHT LOWER EXTREMITY LIMITED
TECHNIQUE: Ultrasound examination of the lower extremity soft tissues was
performed in the area of clinical concern.

[Series 1: us soft tissue lower extremity limited right (non- · 14 of 24 slices shown]
[im 1/24]
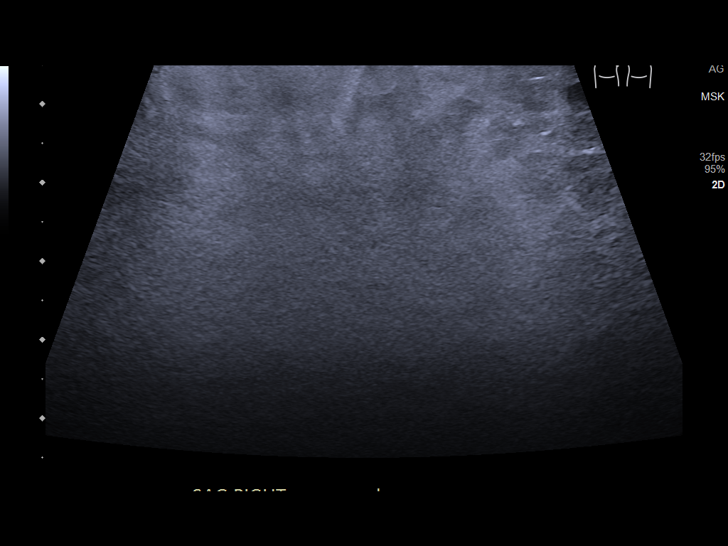
[im 3/24]
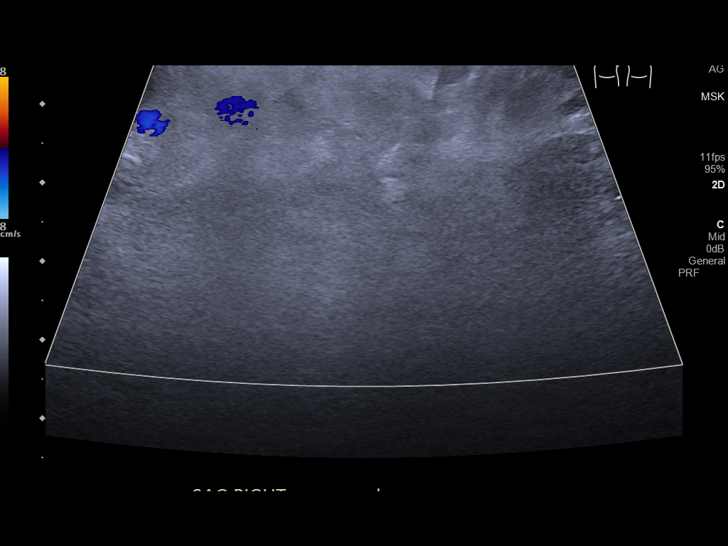
[im 5/24]
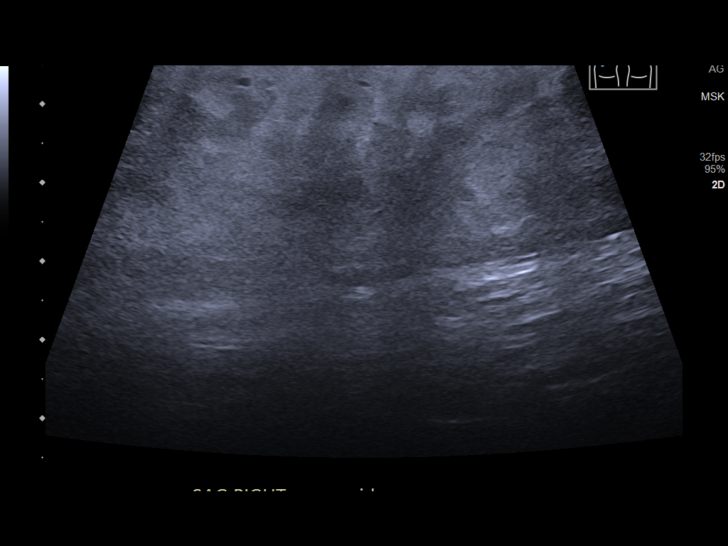
[im 7/24]
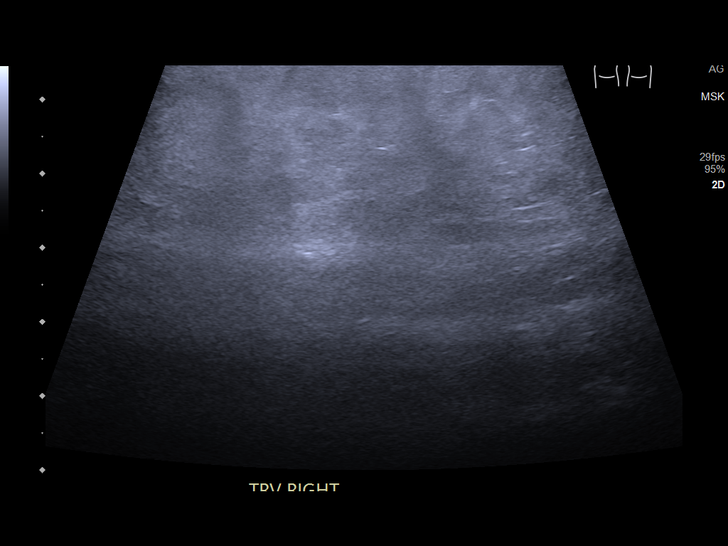
[im 8/24]
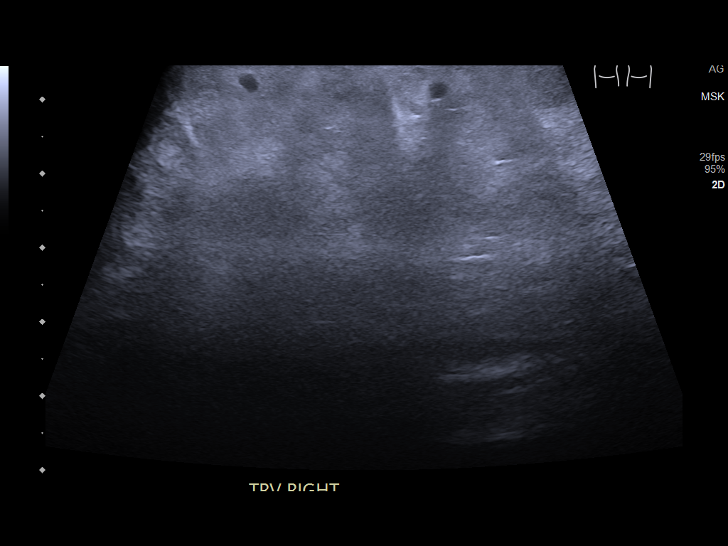
[im 10/24]
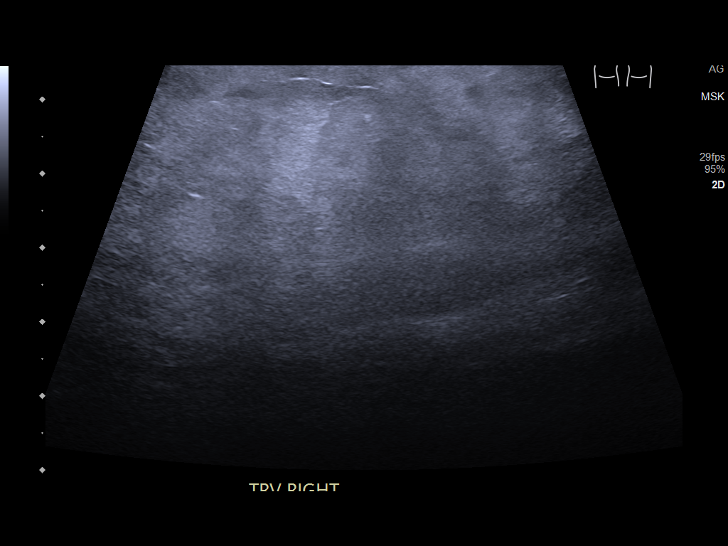
[im 12/24]
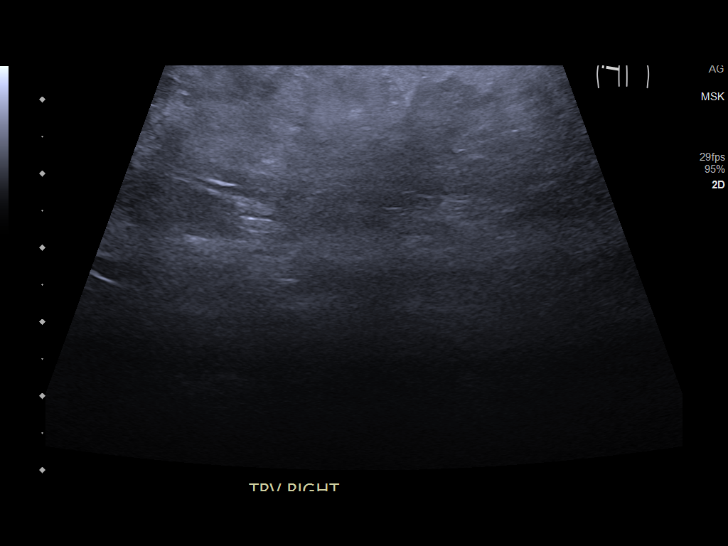
[im 13/24]
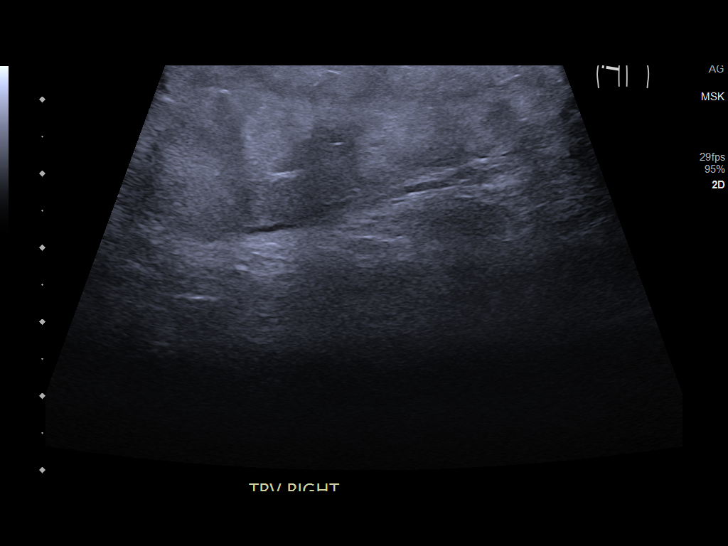
[im 15/24]
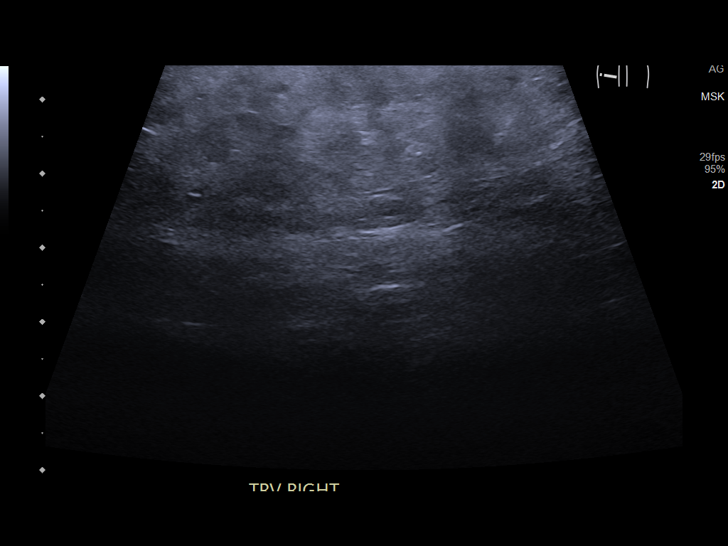
[im 17/24]
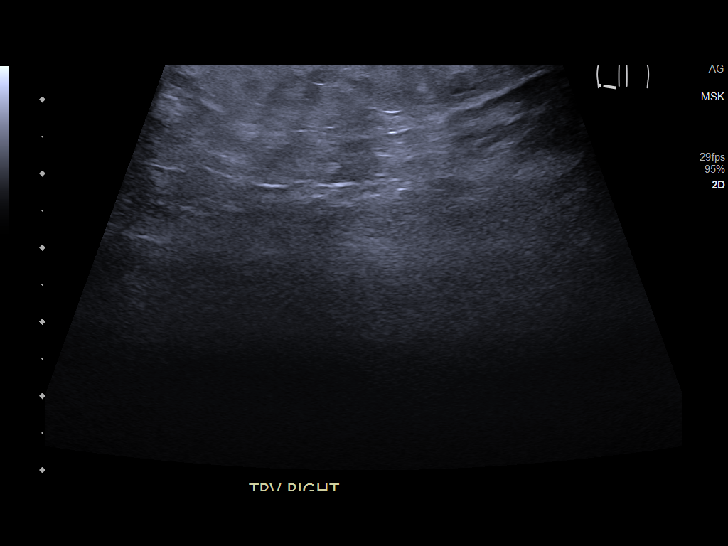
[im 19/24]
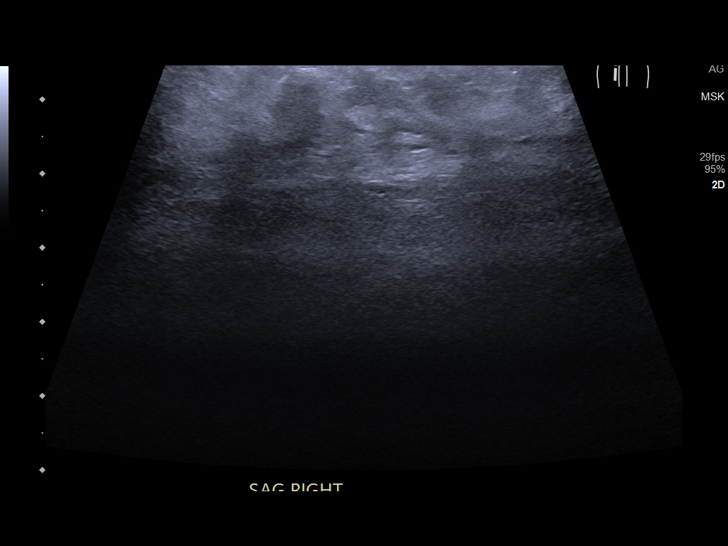
[im 20/24]
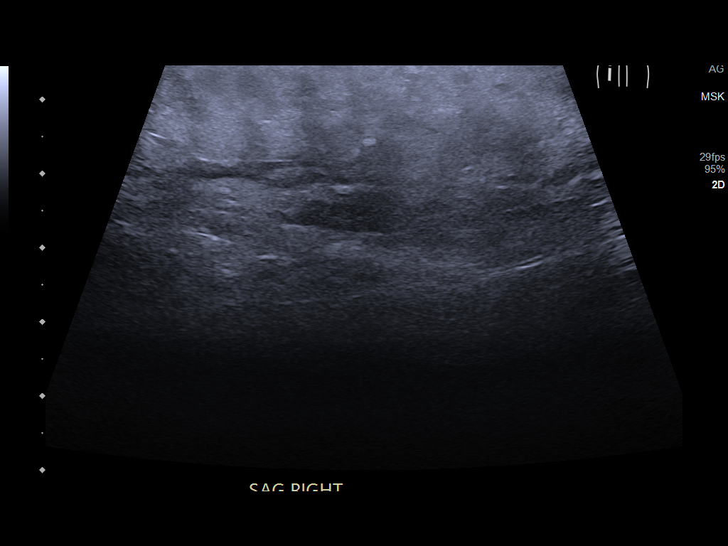
[im 22/24]
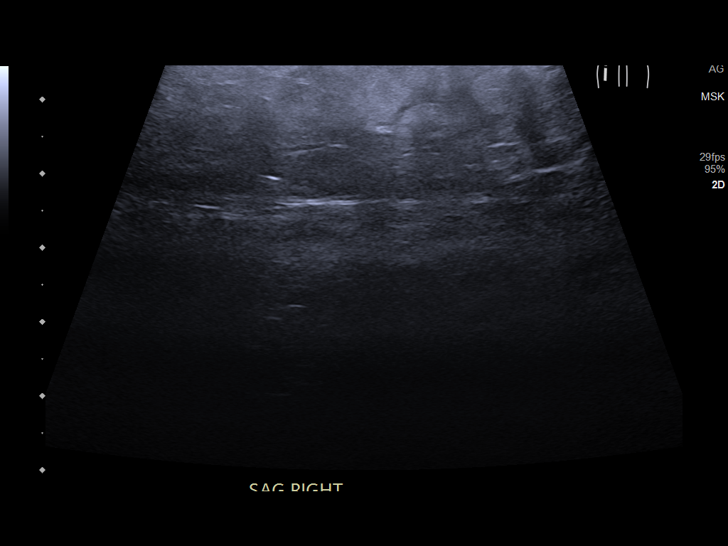
[im 24/24]
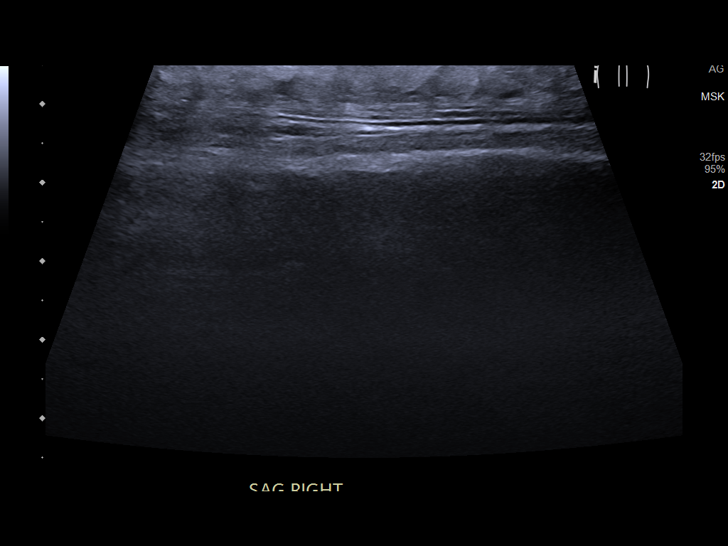

[14 of 24 positions shown; findings below may reference images not displayed]

FINDINGS: No focal mass or fluid collection.  Soft tissue edema noted.
IMPRESSION: No mass or fluid collection.
